# Patient Record
Sex: Female | Born: 1950 | Race: White | Hispanic: No | Smoking: Never smoker
Health system: Southern US, Community
[De-identification: ages and names within clinical notes are randomized; demographics above are authoritative.]

## PROBLEM LIST (undated history)

## (undated) DIAGNOSIS — K219 Gastro-esophageal reflux disease without esophagitis: Secondary | ICD-10-CM

## (undated) DIAGNOSIS — R011 Cardiac murmur, unspecified: Secondary | ICD-10-CM

## (undated) DIAGNOSIS — Z860100 Personal history of colon polyps, unspecified: Secondary | ICD-10-CM

## (undated) DIAGNOSIS — Z8041 Family history of malignant neoplasm of ovary: Secondary | ICD-10-CM

## (undated) DIAGNOSIS — G4730 Sleep apnea, unspecified: Secondary | ICD-10-CM

## (undated) DIAGNOSIS — T8859XA Other complications of anesthesia, initial encounter: Secondary | ICD-10-CM

## (undated) DIAGNOSIS — G2581 Restless legs syndrome: Secondary | ICD-10-CM

## (undated) DIAGNOSIS — I10 Essential (primary) hypertension: Secondary | ICD-10-CM

## (undated) DIAGNOSIS — K589 Irritable bowel syndrome without diarrhea: Secondary | ICD-10-CM

## (undated) DIAGNOSIS — K5792 Diverticulitis of intestine, part unspecified, without perforation or abscess without bleeding: Secondary | ICD-10-CM

## (undated) DIAGNOSIS — E785 Hyperlipidemia, unspecified: Secondary | ICD-10-CM

## (undated) DIAGNOSIS — Z803 Family history of malignant neoplasm of breast: Secondary | ICD-10-CM

## (undated) DIAGNOSIS — Z8719 Personal history of other diseases of the digestive system: Secondary | ICD-10-CM

## (undated) DIAGNOSIS — K559 Vascular disorder of intestine, unspecified: Secondary | ICD-10-CM

## (undated) DIAGNOSIS — D649 Anemia, unspecified: Secondary | ICD-10-CM

## (undated) DIAGNOSIS — C801 Malignant (primary) neoplasm, unspecified: Secondary | ICD-10-CM

## (undated) DIAGNOSIS — F419 Anxiety disorder, unspecified: Secondary | ICD-10-CM

## (undated) DIAGNOSIS — J329 Chronic sinusitis, unspecified: Secondary | ICD-10-CM

## (undated) DIAGNOSIS — T4145XA Adverse effect of unspecified anesthetic, initial encounter: Secondary | ICD-10-CM

## (undated) DIAGNOSIS — M797 Fibromyalgia: Secondary | ICD-10-CM

## (undated) DIAGNOSIS — Z8042 Family history of malignant neoplasm of prostate: Secondary | ICD-10-CM

## (undated) DIAGNOSIS — R7303 Prediabetes: Secondary | ICD-10-CM

## (undated) DIAGNOSIS — F32A Depression, unspecified: Secondary | ICD-10-CM

## (undated) HISTORY — DX: Family history of malignant neoplasm of ovary: Z80.41

## (undated) HISTORY — DX: Essential (primary) hypertension: I10

## (undated) HISTORY — DX: Personal history of colonic polyps: Z86.010

## (undated) HISTORY — DX: Family history of malignant neoplasm of breast: Z80.3

## (undated) HISTORY — DX: Sleep apnea, unspecified: G47.30

## (undated) HISTORY — DX: Personal history of colon polyps, unspecified: Z86.0100

## (undated) HISTORY — DX: Hyperlipidemia, unspecified: E78.5

## (undated) HISTORY — PX: TONSILLECTOMY: SHX5217

## (undated) HISTORY — DX: Gastro-esophageal reflux disease without esophagitis: K21.9

## (undated) HISTORY — DX: Diverticulitis of intestine, part unspecified, without perforation or abscess without bleeding: K57.92

## (undated) HISTORY — PX: CHOLECYSTECTOMY: SHX55

## (undated) HISTORY — DX: Irritable bowel syndrome, unspecified: K58.9

## (undated) HISTORY — DX: Family history of malignant neoplasm of prostate: Z80.42

## (undated) HISTORY — DX: Fibromyalgia: M79.7

## (undated) HISTORY — DX: Restless legs syndrome: G25.81

## (undated) HISTORY — DX: Anxiety disorder, unspecified: F41.9

## (undated) HISTORY — DX: Vascular disorder of intestine, unspecified: K55.9

## (undated) HISTORY — DX: Chronic sinusitis, unspecified: J32.9

---
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
## 1999-03-29 HISTORY — PX: TOTAL ABDOMINAL HYSTERECTOMY: SHX209

## 1999-08-11 ENCOUNTER — Encounter (INDEPENDENT_AMBULATORY_CARE_PROVIDER_SITE_OTHER): Payer: Self-pay

## 1999-08-11 ENCOUNTER — Other Ambulatory Visit: Admission: RE | Admit: 1999-08-11 | Discharge: 1999-08-11 | Payer: Self-pay | Admitting: *Deleted

## 1999-09-07 ENCOUNTER — Encounter (INDEPENDENT_AMBULATORY_CARE_PROVIDER_SITE_OTHER): Payer: Self-pay | Admitting: Specialist

## 1999-09-07 ENCOUNTER — Inpatient Hospital Stay (HOSPITAL_COMMUNITY): Admission: RE | Admit: 1999-09-07 | Discharge: 1999-09-10 | Payer: Self-pay | Admitting: *Deleted

## 2000-10-05 ENCOUNTER — Other Ambulatory Visit: Admission: RE | Admit: 2000-10-05 | Discharge: 2000-10-05 | Payer: Self-pay | Admitting: *Deleted

## 2000-10-05 ENCOUNTER — Encounter (INDEPENDENT_AMBULATORY_CARE_PROVIDER_SITE_OTHER): Payer: Self-pay | Admitting: *Deleted

## 2001-09-07 ENCOUNTER — Encounter: Payer: Self-pay | Admitting: Gastroenterology

## 2001-09-07 ENCOUNTER — Ambulatory Visit (HOSPITAL_COMMUNITY): Admission: RE | Admit: 2001-09-07 | Discharge: 2001-09-07 | Payer: Self-pay | Admitting: Gastroenterology

## 2001-10-08 ENCOUNTER — Other Ambulatory Visit: Admission: RE | Admit: 2001-10-08 | Discharge: 2001-10-08 | Payer: Self-pay | Admitting: *Deleted

## 2002-03-28 HISTORY — PX: CHOLECYSTECTOMY: SHX55

## 2002-10-17 ENCOUNTER — Other Ambulatory Visit: Admission: RE | Admit: 2002-10-17 | Discharge: 2002-10-17 | Payer: Self-pay | Admitting: *Deleted

## 2002-10-21 ENCOUNTER — Encounter (INDEPENDENT_AMBULATORY_CARE_PROVIDER_SITE_OTHER): Payer: Self-pay | Admitting: *Deleted

## 2002-10-21 ENCOUNTER — Ambulatory Visit (HOSPITAL_COMMUNITY): Admission: RE | Admit: 2002-10-21 | Discharge: 2002-10-21 | Payer: Self-pay | Admitting: Gastroenterology

## 2002-11-15 ENCOUNTER — Encounter: Admission: RE | Admit: 2002-11-15 | Discharge: 2002-11-15 | Payer: Self-pay | Admitting: Gastroenterology

## 2002-11-15 ENCOUNTER — Encounter: Payer: Self-pay | Admitting: Gastroenterology

## 2003-01-03 ENCOUNTER — Encounter: Payer: Self-pay | Admitting: Surgery

## 2003-01-09 ENCOUNTER — Observation Stay (HOSPITAL_COMMUNITY): Admission: RE | Admit: 2003-01-09 | Discharge: 2003-01-10 | Payer: Self-pay | Admitting: Surgery

## 2003-01-09 ENCOUNTER — Encounter (INDEPENDENT_AMBULATORY_CARE_PROVIDER_SITE_OTHER): Payer: Self-pay | Admitting: Specialist

## 2004-08-04 ENCOUNTER — Encounter: Admission: RE | Admit: 2004-08-04 | Discharge: 2004-08-04 | Payer: Self-pay | Admitting: Gastroenterology

## 2004-08-09 ENCOUNTER — Encounter: Admission: RE | Admit: 2004-08-09 | Discharge: 2004-08-09 | Payer: Self-pay | Admitting: Gastroenterology

## 2008-12-11 ENCOUNTER — Encounter: Admission: RE | Admit: 2008-12-11 | Discharge: 2008-12-11 | Payer: Self-pay | Admitting: Gastroenterology

## 2009-03-28 HISTORY — PX: NISSEN FUNDOPLICATION: SHX2091

## 2009-05-15 ENCOUNTER — Ambulatory Visit (HOSPITAL_COMMUNITY): Admission: RE | Admit: 2009-05-15 | Discharge: 2009-05-15 | Payer: Self-pay | Admitting: Surgery

## 2009-05-19 ENCOUNTER — Ambulatory Visit (HOSPITAL_COMMUNITY): Admission: RE | Admit: 2009-05-19 | Discharge: 2009-05-19 | Payer: Self-pay | Admitting: Gastroenterology

## 2009-06-18 ENCOUNTER — Encounter: Admission: RE | Admit: 2009-06-18 | Discharge: 2009-06-18 | Payer: Self-pay | Admitting: Cardiovascular Disease

## 2009-06-24 ENCOUNTER — Ambulatory Visit (HOSPITAL_COMMUNITY): Admission: RE | Admit: 2009-06-24 | Discharge: 2009-06-24 | Payer: Self-pay | Admitting: Cardiovascular Disease

## 2009-07-06 ENCOUNTER — Ambulatory Visit (HOSPITAL_COMMUNITY): Admission: RE | Admit: 2009-07-06 | Discharge: 2009-07-06 | Payer: Self-pay | Admitting: Gastroenterology

## 2009-09-11 ENCOUNTER — Ambulatory Visit (HOSPITAL_COMMUNITY): Admission: RE | Admit: 2009-09-11 | Discharge: 2009-09-14 | Payer: Self-pay | Admitting: Surgery

## 2009-09-11 HISTORY — PX: PARAESOPHAGEAL HERNIA REPAIR: SHX2161

## 2010-02-01 ENCOUNTER — Ambulatory Visit (HOSPITAL_COMMUNITY): Admission: RE | Admit: 2010-02-01 | Discharge: 2010-02-01 | Payer: Self-pay | Admitting: Surgery

## 2010-04-18 ENCOUNTER — Encounter: Payer: Self-pay | Admitting: Gastroenterology

## 2010-04-18 ENCOUNTER — Encounter: Payer: Self-pay | Admitting: Cardiology

## 2010-06-14 LAB — CBC
MCHC: 34.5 g/dL (ref 30.0–36.0)
RDW: 12.7 % (ref 11.5–15.5)

## 2010-06-14 LAB — BASIC METABOLIC PANEL
CO2: 27 mEq/L (ref 19–32)
Calcium: 9.2 mg/dL (ref 8.4–10.5)
Chloride: 106 mEq/L (ref 96–112)
GFR calc non Af Amer: >60 mL/min (ref >60)
Glucose, Bld: 88 mg/dL (ref 70–99)
Potassium: 4.3 mEq/L (ref 3.5–5.1)

## 2010-06-14 LAB — SURGICAL PCR SCREEN
MRSA, PCR: NEGATIVE
Staphylococcus aureus: POSITIVE — AB

## 2010-08-13 NOTE — Op Note (Signed)
NAME:  Doris Reyes, Doris Reyes                        ACCOUNT NO.:  [id number]   MEDICAL RECORD NO.:  [id number]                   PATIENT TYPE:  AMB   LOCATION:  DAY                                  FACILITY:  [facility]   PHYSICIAN:  Abigail Miyamoto, M.D.              DATE OF BIRTH:  03-04-1951   DATE OF PROCEDURE:  01/09/2003  DATE OF DISCHARGE:                                 OPERATIVE REPORT   PREOPERATIVE DIAGNOSIS:  Biliary dyskinesia.   POSTOPERATIVE DIAGNOSIS:  Biliary dyskinesia.   PROCEDURE:  Laparoscopic cholecystectomy.   SURGEON:  Abigail Miyamoto, M.D.   ASSISTANT:  Sheppard Plumber. Earlene Plater, M.D.   ANESTHESIA:  General endotracheal anesthesia.   ESTIMATED BLOOD LOSS:  Minimal.   FINDINGS:  The patient was found to have a chronically scarred gallbladder.  The cystic duct was too small and had multiple valves so a cholangiogram  could not be performed.   DESCRIPTION OF PROCEDURE:  The patient was brought to the operating room,  identified as Doris Reyes. She was placed supine on the operating room  table and general anesthesia was induced. Her abdomen was then prepped and  draped in the usual sterile fashion. Using a #15 blade, a small transverse  incision was made below the umbilicus, the incision was carried down to the  fascia which was then opened with the scalpel. A hemostat was then used to  pass through the peritoneal cavity. Next, a 0 Vicryl pursestring suture was  placed around the fascial opening. The Hasson port was placed through the  opening and insufflation of the abdomen was begun. A 12 mm port was then  placed in the patient's epigastrium and two 5 mm ports were placed in the  patient's right flank under direct vision. The gallbladder was then grasped  and retracted above the liver bed.  Dissection was then carried out at the  base of the gallbladder. The patient had multiple adhesions to the  gallbladder which were taken down bluntly. The cystic duct and  cystic artery  were then easily identified. The artery was actually clipped twice  proximally, one distally and transected with the scissors. The cystic duct  was then clipped once distally and opened with the laparoscopic scissors. An  angiocatheter was then inserted into the right upper quadrant under direct  vision. The cholangiocatheter was passed through this and multiple attempts  were made to pass it into the opening of the cystic duct. The cystic duct  was quite small and had multiple valves and the catheter could not be  successfully passed into the duct. Therefore a decision was made to forego  cholangiogram. At this point, the cystic duct was clipped four times  proximally and transected with the scissors. The gallbladder was then slowly  dissected free from the liver bed with the electrocautery. Once the  gallbladder was free from the liver bed, the liver bed was examined and  hemostasis was achieved with the cautery. The gallbladder was then removed  through the incision at the umbilicus. The 0 Vicryl at the umbilicus was  tied in place closing the fascial defect. The abdomen was again examined and  hemostasis felt to be achieved. All ports were then removed under direct  vision and the abdomen was deflated. All incisions were anesthetized with  0.25% Marcaine and closed with 4-0 Monocryl subcuticular sutures. Steri-  Strips,  gauze and tape were then applied. The patient tolerated the procedure well.  All sponge, needle and instrument counts were correct at the end of the  procedure. The patient was then extubated in the operating room and taken in  stable condition to the recovery room.                                               Abigail Miyamoto, M.D.    DB/MEDQ  D:  01/09/2003  T:  01/09/2003  Job:  [id number]   cc:   Anselmo Rod, M.D.  [address]  [address]  [place]  [place] [address]  Fax: [phone]

## 2010-08-13 NOTE — Op Note (Signed)
   NAME:  Doris Reyes, Doris Reyes                        ACCOUNT NO.:  [id number]   MEDICAL RECORD NO.:  [id number]                   PATIENT TYPE:  AMB   LOCATION:  ENDO                                 FACILITY:  [facility]   PHYSICIAN:  Anselmo Rod, M.D.               DATE OF BIRTH:  12-04-1950   DATE OF PROCEDURE:  10/21/2002  DATE OF DISCHARGE:                                 OPERATIVE REPORT   PROCEDURE PERFORMED:  Colonoscopy with snare polypectomy x1.   ENDOSCOPIST:  Anselmo Rod, M.D.   INSTRUMENT USED:  Olympus video colonoscope.   INDICATION FOR PROCEDURE:  A 60 year old white female with a family history  of colon cancer in her mother, undergoing a screening colonoscopy.  Rule out  colonic polyps, masses, etc.   PREPROCEDURE PREPARATION:  Informed consent was procured from the patient.  The patient was fasted for eight hours prior to the procedure and prepped  with a bottle of magnesium citrate and a gallon of GoLYTELY the night prior  to the procedure.   PREPROCEDURE PHYSICAL:  VITAL SIGNS:  The patient had stable vital signs.  NECK:  Supple.  CHEST:  Clear to auscultation.  S1, S2 regular.  ABDOMEN:  Soft with normal bowel sounds.   DESCRIPTION OF PROCEDURE:  The patient was placed in the left lateral  decubitus position and sedated with an additional 10 mg of Demerol and 1 mg  of Versed for the colonoscopy.  She received Demerol and Versed for the EGD  as well.  Once the patient was adequately sedate and maintained on low-flow  oxygen and continuous cardiac monitoring, the Olympus video colonoscope was  advanced from the rectum to the cecum without difficulty.  The appendiceal  orifice and the ileocecal valve were clearly visualized and photographed.  The terminal ileum appeared normal and healthy.  A small polyp was snared  from 15 cm.  The rest of the mucosa appeared healthy.   IMPRESSION:  Normal colonoscopy up to the terminal ileum except for a small  polyp  snared at 15 cm.    RECOMMENDATIONS:  1. Await pathology results.  2. Avoid all nonsteroidals, including aspirin, for the next two weeks.  3. Outpatient follow-up in the next two weeks for further recommendations.                                               Anselmo Rod, M.D.    JNM/MEDQ  D:  10/22/2002  T:  10/23/2002  Job:  [id number]   cc:   Melida Quitter, M.D.  [address]  [place]  [place] [address]  Fax: [phone]

## 2010-08-13 NOTE — Discharge Summary (Signed)
[facility]  Patient:    Doris Reyes, Doris Reyes                     MRN: [id number] Adm. Date:  [id number] Disc. Date: [id number] Attending:  Marin Comment CC:         Arvella Merles, M.D.                           Discharge Summary  ADMISSION DIAGNOSES: 1. Complex left adnexal mass. 2. Dysfunctional uterine bleeding.  DISCHARGE DIAGNOSES: 1. Endometriotic cyst of the left ovary. 2. Leiomyomata. 3. Benign right ovarian cyst. 4. Unremarkable cervix.  For details of the patients history and physical, please see the note dictated on and dated September 07, 1999.  HISTORY OF PRESENT ILLNESS:  The patient is 60 years old.  She had an episode of dysfunctional uterine bleeding over the last year which has been associated with perimenopausal symptoms.  She has fibroids in her uterus.  She was noted to have an enlargement in her left adnexa and sonogram showed a complex mass thought to be due to a dermoid.  After counseling, she was brought to the operating room for excision of this left adnexal mass with plans for ovarian cancer staging if this were found on frozen section.  In addition, a hysterectomy was to performed because of her dysfunctional uterine bleeding and myomas.  HOSPITAL COURSE:  The patient was taken to the operating room on the day of admission and under general anesthesia, total abdominal hysterectomy and bilateral salpingo-oophorectomy was performed.  The patients left ovary was 6 cm in size and was consistent with an endometrioma.  On the evening of surgery, the patient was alert and conversant.  Her pain was moderately well controlled with PCA and she was used to use her inspirometer without difficulty.  She was hypotensive and her atenolol was held.  On the morning of postoperative day #1, she had some mild nausea, but had not vomited.  She was started on clear liquids which she tolerated well. Hemoglobin was stable at 10.9.  On  that day, she was given a discharge instruction sheet.  When seen later in the evening, she had vomited twice that morning.  She had had no flatus.  We discontinued her Foley catheter, got her out of bed to move around and continued her PCA.  On the morning of postoperative day #2, she had flatus.  She had had an episode of significant upper abdominal pain and her Prevacid was restarted.  She was also given her prescription for dicyclomine which she receives for irritable bowel syndrome. On that day, she was able to advance her diet to a regular diet and was begun on pain medication.  The plans were to discharge her that evening, however she began to have more abdominal pain and was concerned about being home with this as a symptom.  For this reason, she stayed overnight.  She was seen on the morning of postoperative day #3, still tolerating a regular diet.  She had now had a bowel movement and was tolerating Tylox for pain medication and was able to be discharged.  PLANS:  The patient will come to the office on Monday to have her staples removed.  MEDICATIONS ON DISCHARGE: 1. Tylox one p.o. q.4h., #30. 2. Darvocet, #30, one p.o. q.4h. 3. Peri-Colace, #10, one p.o. q.d. while on Tylox. 4. Vivelle patch 0.05 mg to change twice  weekly. DD:  09/10/99 TD:  09/14/99 Job: [id number]

## 2010-08-13 NOTE — Op Note (Signed)
   NAME:  Doris Reyes, Doris Reyes                        ACCOUNT NO.:  [id number]   MEDICAL RECORD NO.:  [id number]                   PATIENT TYPE:  AMB   LOCATION:  ENDO                                 FACILITY:  [facility]   PHYSICIAN:  Anselmo Rod, M.D.               DATE OF BIRTH:  [birth year]   DATE OF PROCEDURE:  10/21/2002  DATE OF DISCHARGE:                                 OPERATIVE REPORT   PROCEDURE PERFORMED:  Esophagogastroduodenoscopy.   ENDOSCOPIST:  Anselmo Rod, M.D.   INSTRUMENT USED:  Olympus video colonoscope.   INDICATION FOR PROCEDURE:  A 59 year old female undergoing an upper  endoscopy for epigastric pain.  Rule out peptic ulcer disease, esophagitis,  gastritis, etc.   PREPROCEDURE PREPARATION:  Informed consent was procured from the patient.  The patient fasted for eight hours prior to the procedure.   PREPROCEDURE PHYSICAL:  VITAL SIGNS:  The patient had stable vital signs.  NECK:  Supple.  CHEST:  Clear to auscultation.  S1, S2 regular.  ABDOMEN:  Soft with normal bowel sounds.   DESCRIPTION OF PROCEDURE:  The patient was placed in the left lateral  decubitus position and sedated with 50 mg of Demerol and 5 mg of Versed  intravenously.  Once the patient was adequately sedate and maintained on low-  flow oxygen and continuous cardiac monitoring, the Olympus video  panendoscope was advanced through the mouthpiece, over the tongue, into the  esophagus under direct vision.  The entire esophagus appeared normal with no  evidence of ring, stricture, masses, esophagitis, or Barrett's mucosa.  The  scope was then advanced in the stomach.  A small hiatal hernia was seen on  high retroflexion.  Except for mild antral gastritis, no other abnormalities  were noted.  The duodenal bulb and the proximal small bowel appeared normal  as well.   IMPRESSION:  1. Mild antral gastritis.  2.     Small hiatal hernia.  3. Otherwise normal EGD.   RECOMMENDATIONS:  Proceed  with a colonoscopy at this time.                                               Anselmo Rod, M.D.    JNM/MEDQ  D:  10/22/2002  T:  10/23/2002  Job:  [id number]   cc:   Melida Quitter, M.D.  [address]  [place]  [place] [address]  Fax: [phone]

## 2010-08-13 NOTE — Op Note (Signed)
[facility]  Patient:    Doris Reyes, EBARB                     MRN: [id number] Adm. Date:  [id number] Attending:  Marin Comment CC:         Arvella Merles, M.D.             Pershing Cox, M.D.             Sheronette A. Cherly Hensen, M.D.                           Operative Report  PREOPERATIVE DIAGNOSES:  Complex left adnexal mass and dysfunctional uterine bleeding.  POSTOPERATIVE DIAGNOSES:  Bilateral endometriomas, dysfunctional bleeding.  PROCEDURE:  Exploratory laparotomy, total abdominal hysterectomy, bilateral salpingo-oophorectomy.  ANESTHESIA:  General endotracheal.  SURGEON:  Pershing Cox, M.D.  ASSISTANT SURGEON:  Sheronette A. Cherly Hensen, M.D.  INDICATION FOR PROCEDURE:  The patient is 60 years old.  She has recently had some problems with dysfunctional uterine bleeding and her physician ordered a pelvic ultrasound.  At that time, she was found to have a uterus which was enlarged in size, with a 4.8-cm endometrial stripe and a left adnexal mass. An MRI followed this diagnostic study, suggesting a cystic teratoma.  She was sent to me for evaluation.  Following her evaluation, she was counseled regarding surgery.  I felt that a diagnostic laparoscopy with oophorectomy was an inappropriate approach to this large mass and she was scheduled for hysterectomy.  Because the patient is in the perimenopausal years and she has had persistent dysfunctional bleeding, she elected to proceed with hysterectomy, even if a benign mass was found.  FINDINGS:  There was no ascites.  There were no peritoneal implants.  The ovary was approximately 10 cm in size and was adherent to the epiploica of the rectosigmoid as well as to the posterior surface of the broad ligament.  In removing the ovary from the posterior surface of the broad ligament, chocolate fluid was evacuated from the ovary.  The right ovary contained a 3-cm cyst, which also  released chocolate fluid on manipulation.  The uterus itself was approximately 10 cm in size.  There were no palpable myomas.  Frozen section diagnosis returned as endometriomas for both the right and left ovaries. There was adhesive disease of the rectosigmoid to the posterior cul-de-sac but other than this, no other adhesive disease was noted.  The appendix was totally normal in appearance.  DESCRIPTION OF PROCEDURE:  The patient was brought to the operating room with an IV in place.  She had received a gram of Ancef in the holding area.  Supine on the OR table, general endotracheal anesthesia was administered without difficulty.  She was then placed into a frog-leg position and the anterior abdominal wall, perineum and vagina were prepped with a solution of Hibiclens. Foley catheter was sterilely inserted into the bladder.  Patient was draped for a midline incision.  A subumbilical incision approximately 6 cm in length was made through the skin, carried down through the subcutaneous tissues until the fascia was encountered.  The fascia was opened, exposing the underlying rectus muscles, which were separated in the midline, after dividing the prepectoral fat.  The peritoneum was tented and entered atraumatically.  Both anterior abdominal wall leaves were lifted with retractors and 500 cc of warm saline were instilled and collected for peritoneal washings.  Warm laps were  then used to pack the bowel into the upper abdomen.  Peritoneal incision was extended the length of the incision.  Long Kelly clamps were placed along the adnexal structures.  Both round ligaments were suture-ligated and held after cautery, transected the ligaments.  The peritoneum was incised lateral to the ovarian vessels.  The vessels were reflected medially so that the ureters could be visualized.  A clear space beneath the ovarian vessels was developed and the vessels were clamped, cut, suture- and  free-tie-ligated.  Posterior peritoneum was then incised beneath the ovary and fallopian tube.  On the left, the dissection required lysis of multiple adhesions.  There were lysed without rupturing the ovary; however, as the ovary was lifted off of the posterior peritoneum, thick, dark crankcase fluid was extruded.   The uteroovarian ligament was clamped; the ovary was removed and sent for frozen section.  The peritoneal cavity was then copiously irrigated to remove the spilled contents. The ovary on the right side was also skeletonized prior to bringing it up onto the surface of the uterus.  Peritoneum along the lower uterine segment was incised; this was a difficult dissection.  There was no clear plane initially beneath the space to separate the bladder from the lower uterine segment.  We were able to clear enough space so that the uterine arteries could be clamped. The arteries were skeletonized, then doubly clamped and suture-ligated.  The bladder was then carefully taken down from the middle of the vagina so that the straight clamps applied along the cervix to separate it from the cardinal ligament produced no injury to the bladder.  These sutures were Heaney stitched.  Uterosacral ligaments were developed as individual pedicles and held for later in the case.  Entered the vagina on the patients right.  With the bladder carefully visualized, the cervix was excised from the upper vagina.  The vagina was wide and was held with clamps.  Angled sutures were placed, both on the right and left, and then later these sutures were used to whip the anterior and posterior cuff.  The vagina was brought together side-to-side using three figure-of-eight sutures.  We then irrigated the peritoneal cavity.  There were multiple small bleeders which were controlled with cautery, or when appropriate, a 3-0 Vicryl stitch.  It should be noted that the previously held uterosacral ligaments were tied to the  vaginal angles to offer additional support to the vagina.  Redundant rectosigmoid was layered  into the cul-de-sac to help prevent the potential enterocele formation.  A Moshowitz was not performed.  The IP ligaments were inspected and there was no evidence of bleeding.  Small bowel was then layered carefully into the peritoneal cavity and the omentum was drawn down over the top.  The fascia was closed with running double-stranded Prolene, starting from the bottom and the top, meeting in the middle.  The stitch was buried beneath the fascia. Subcutaneous tissues were irrigated and made hemostatic.  Marcaine 0.25% was instilled, both into the fascia and into the subcutaneous tissue of the incision, and skin staples were applied.  Estimated blood loss -- 2500 cc.  Urine output -- 275 cc.  Fluids -- 2500 cc. D:  09/07/99 TD:  09/09/99 Job: [id number]

## 2010-10-05 ENCOUNTER — Encounter (INDEPENDENT_AMBULATORY_CARE_PROVIDER_SITE_OTHER): Payer: Self-pay | Admitting: Surgery

## 2010-10-06 ENCOUNTER — Ambulatory Visit (INDEPENDENT_AMBULATORY_CARE_PROVIDER_SITE_OTHER): Payer: Medicare Other | Admitting: Surgery

## 2010-10-06 ENCOUNTER — Other Ambulatory Visit (INDEPENDENT_AMBULATORY_CARE_PROVIDER_SITE_OTHER): Payer: Self-pay | Admitting: Surgery

## 2010-10-06 ENCOUNTER — Encounter (INDEPENDENT_AMBULATORY_CARE_PROVIDER_SITE_OTHER): Payer: Self-pay | Admitting: Surgery

## 2010-10-06 VITALS — BP 126/90 | HR 72 | Temp 96.8°F | Ht 64.0 in | Wt 205.4 lb

## 2010-10-06 DIAGNOSIS — R1110 Vomiting, unspecified: Secondary | ICD-10-CM | POA: Insufficient documentation

## 2010-10-06 DIAGNOSIS — K219 Gastro-esophageal reflux disease without esophagitis: Secondary | ICD-10-CM

## 2010-10-06 DIAGNOSIS — K910 Vomiting following gastrointestinal surgery: Secondary | ICD-10-CM

## 2010-10-06 DIAGNOSIS — K3189 Other diseases of stomach and duodenum: Secondary | ICD-10-CM

## 2010-10-06 DIAGNOSIS — K30 Functional dyspepsia: Secondary | ICD-10-CM

## 2010-10-06 NOTE — Patient Instructions (Signed)
Increase omeprazole to 2x a day for at least 3 weeks.  Get Xray studies (UGI & gastric emptying study).  Call us after studies done  Switch to smaller meals 5-6x/day  Continue to work to lose weight.  Call if worse

## 2010-10-06 NOTE — Progress Notes (Signed)
Addended by: Karie Soda C on: 10/06/2010 11:45 AM   Modules accepted: Orders, SmartSet

## 2010-10-06 NOTE — Progress Notes (Addendum)
Subjective:     Patient ID: Doris Reyes, female   DOB: Oct 05, 1950, 60 y.o.   MRN: [id number]    BP 126/90  Pulse 72  Temp(Src) 96.8 F (36 C) (Temporal)  Ht 5\' 4"  (1.626 m)  Wt 205 lb 6.4 oz (93.169 kg)  BMI 35.26 kg/m2    HPI  Diagnosis: Paraesophageal hiatal hernia and reflux  Procedure: Laparoscopic PE hiatal hernia repair/Nissen fundoplication June 2011  Reason for visit:  Nausea, vomiting symptoms.  The patient notes in April 2012 she began to have an episode of "flu". She vomited a few times. She took nausea medicines and rode it out. She now notes that about every 2 weeks she gets an episode of some nausea and retching. She will get diarrhea associated with this. She's had a couple episodes where she will feel  "foul-smelling" taste in the back of her throat. It is not consistent with reflux she said the past.  No dysphagia to solids nor liquids. She denies any heartburn / reflux. She has been on omeprazole daily for the past 3 months. She did have a workup for him nasal drip by Dr. Jearld Fenton with ENT back in December 2011. He switched her allergy medications (to Allegra) and that is much better.  She denies any sore throat. She denies any sinusitis. She does notes no reflux with bending nor lying flat. Her energy levels otherwise has been okay. She has not lost weight. She again struggles with the intermittent bouts of diarrhea. She seen Dr. Loreta Ave with GI who is giving her medications for this Ernestina Patches).  Review of Systems  Constitutional: Negative for fever, chills, diaphoresis, appetite change and fatigue.  HENT: Negative for nosebleeds, sore throat, mouth sores, neck pain and neck stiffness.   Eyes: Negative for photophobia, discharge and visual disturbance.  Respiratory: Negative for cough, choking, chest tightness and shortness of breath.   Cardiovascular: Negative for chest pain and palpitations.  Gastrointestinal: Positive for nausea and vomiting. Negative for  abdominal pain, diarrhea, constipation, blood in stool, abdominal distention, anal bleeding and rectal pain.  Genitourinary: Negative for dysuria, frequency, flank pain, vaginal bleeding, vaginal discharge and difficulty urinating.  Musculoskeletal: Negative for back pain, arthralgias and gait problem.  Skin: Negative for color change, pallor and rash.  Neurological: Negative for dizziness, speech difficulty, weakness and numbness.  Hematological: Negative for adenopathy. Does not bruise/bleed easily.  Psychiatric/Behavioral: Negative for confusion and agitation. The patient is not nervous/anxious.        Objective:   Physical Exam  Constitutional: She is oriented to person, place, and time. She appears well-developed and well-nourished. No distress.  HENT:  Head: Normocephalic.  Nose: Nose normal. Right sinus exhibits no maxillary sinus tenderness and no frontal sinus tenderness. Left sinus exhibits no maxillary sinus tenderness and no frontal sinus tenderness.  Mouth/Throat: Oropharynx is clear and moist and mucous membranes are normal. No oral lesions. Normal dentition. No dental abscesses, uvula swelling or dental caries. No oropharyngeal exudate, posterior oropharyngeal edema, posterior oropharyngeal erythema or tonsillar abscesses.  Eyes: Conjunctivae and EOM are normal. Pupils are equal, round, and reactive to light. No scleral icterus.  Neck: Normal range of motion. Neck supple. No tracheal deviation present.  Cardiovascular: Normal rate, regular rhythm and intact distal pulses.   Pulmonary/Chest: Effort normal and breath sounds normal. No respiratory distress. She exhibits no tenderness.  Abdominal: Soft. She exhibits no distension and no mass. There is no tenderness. There is no guarding. Hernia confirmed negative in the right  inguinal area and confirmed negative in the left inguinal area.       Incisions healed.  No hernia  Genitourinary: No vaginal discharge found.    Musculoskeletal: Normal range of motion. She exhibits no tenderness.  Lymphadenopathy:    She has no cervical adenopathy.       Right: No inguinal adenopathy present.       Left: No inguinal adenopathy present.  Neurological: She is alert and oriented to person, place, and time. No cranial nerve deficit. She exhibits normal muscle tone. Coordination normal.  Skin: Skin is warm and dry. No rash noted. She is not diaphoretic. No erythema.  Psychiatric: She has a normal mood and affect. Her behavior is normal. Judgment and thought content normal.       Assessment:     Foul taste in the back of throat ? Possible reflux.  Intermittent vomiting ?gastroenteritis, delayed gastric emptying, irritable bowel.    Plan:     Increase omeprazole to twice a day.  Switch to smaller, more frequent meals.  Esophagram to evaluate wrap, r/o slipped wrap, recurrent HH, r/o Zenker's.  Gastric emptying study. She had some moderately delayed gastric emptying preop but it does not make sense why it suddenly this got worse. See if she has any persistent delay in and  would benefit from further treatment.  Nissen usually helps correct gastroparesis  Call after her studies are done.

## 2010-10-12 ENCOUNTER — Ambulatory Visit (HOSPITAL_COMMUNITY)
Admission: RE | Admit: 2010-10-12 | Discharge: 2010-10-12 | Disposition: A | Discharge status: Home / Self Care | Payer: Medicare Other | Source: Ambulatory Visit | Attending: Surgery | Admitting: Surgery

## 2010-10-12 DIAGNOSIS — Z9889 Other specified postprocedural states: Secondary | ICD-10-CM | POA: Insufficient documentation

## 2010-10-12 DIAGNOSIS — R112 Nausea with vomiting, unspecified: Secondary | ICD-10-CM | POA: Insufficient documentation

## 2010-10-12 DIAGNOSIS — Z9089 Acquired absence of other organs: Secondary | ICD-10-CM | POA: Insufficient documentation

## 2010-10-12 DIAGNOSIS — K219 Gastro-esophageal reflux disease without esophagitis: Secondary | ICD-10-CM

## 2010-10-12 DIAGNOSIS — K30 Functional dyspepsia: Secondary | ICD-10-CM

## 2010-10-12 DIAGNOSIS — K910 Vomiting following gastrointestinal surgery: Secondary | ICD-10-CM

## 2010-10-22 ENCOUNTER — Other Ambulatory Visit (HOSPITAL_COMMUNITY): Payer: Self-pay

## 2010-11-05 ENCOUNTER — Encounter (HOSPITAL_COMMUNITY)
Admission: RE | Admit: 2010-11-05 | Discharge: 2010-11-05 | Disposition: A | Discharge status: Home / Self Care | Payer: Medicare Other | Source: Ambulatory Visit | Attending: Surgery | Admitting: Surgery

## 2010-11-05 DIAGNOSIS — K910 Vomiting following gastrointestinal surgery: Secondary | ICD-10-CM

## 2010-11-05 DIAGNOSIS — K30 Functional dyspepsia: Secondary | ICD-10-CM

## 2010-11-05 DIAGNOSIS — R112 Nausea with vomiting, unspecified: Secondary | ICD-10-CM | POA: Insufficient documentation

## 2010-11-05 MED ORDER — TECHNETIUM TC 99M SULFUR COLLOID
2.0000 | Freq: Once | INTRAVENOUS | Status: AC | PRN
  Administered 2010-11-05: 2 via INTRAVENOUS

## 2010-11-09 ENCOUNTER — Telehealth (INDEPENDENT_AMBULATORY_CARE_PROVIDER_SITE_OTHER): Payer: Self-pay

## 2010-11-09 NOTE — Telephone Encounter (Signed)
Called Doris Reyes to notify her of the xray results per Dr Michaell Cowing. Doris Reyes was notified that the results were good with no recurrent hiatal hernia and there was no slip of the wrap. Doris Reyes has seen Dr Loreta Ave recently and will follow up with her in the next couple of weeks./ [facility]

## 2011-07-25 DIAGNOSIS — IMO0001 Reserved for inherently not codable concepts without codable children: Secondary | ICD-10-CM | POA: Diagnosis not present

## 2011-07-25 DIAGNOSIS — J309 Allergic rhinitis, unspecified: Secondary | ICD-10-CM | POA: Diagnosis not present

## 2011-07-25 DIAGNOSIS — E785 Hyperlipidemia, unspecified: Secondary | ICD-10-CM | POA: Diagnosis not present

## 2011-07-25 DIAGNOSIS — T7840XA Allergy, unspecified, initial encounter: Secondary | ICD-10-CM | POA: Diagnosis not present

## 2011-07-25 DIAGNOSIS — K589 Irritable bowel syndrome without diarrhea: Secondary | ICD-10-CM | POA: Diagnosis not present

## 2011-07-25 DIAGNOSIS — I10 Essential (primary) hypertension: Secondary | ICD-10-CM | POA: Diagnosis not present

## 2011-07-25 DIAGNOSIS — F411 Generalized anxiety disorder: Secondary | ICD-10-CM | POA: Diagnosis not present

## 2011-08-04 DIAGNOSIS — M949 Disorder of cartilage, unspecified: Secondary | ICD-10-CM | POA: Diagnosis not present

## 2011-08-04 DIAGNOSIS — Z1231 Encounter for screening mammogram for malignant neoplasm of breast: Secondary | ICD-10-CM | POA: Diagnosis not present

## 2011-08-04 DIAGNOSIS — Z8262 Family history of osteoporosis: Secondary | ICD-10-CM | POA: Diagnosis not present

## 2011-10-21 DIAGNOSIS — H25019 Cortical age-related cataract, unspecified eye: Secondary | ICD-10-CM | POA: Diagnosis not present

## 2011-12-30 DIAGNOSIS — Z23 Encounter for immunization: Secondary | ICD-10-CM | POA: Diagnosis not present

## 2012-01-24 DIAGNOSIS — E785 Hyperlipidemia, unspecified: Secondary | ICD-10-CM | POA: Diagnosis not present

## 2012-01-24 DIAGNOSIS — IMO0001 Reserved for inherently not codable concepts without codable children: Secondary | ICD-10-CM | POA: Diagnosis not present

## 2012-01-24 DIAGNOSIS — I10 Essential (primary) hypertension: Secondary | ICD-10-CM | POA: Diagnosis not present

## 2012-01-24 DIAGNOSIS — G2589 Other specified extrapyramidal and movement disorders: Secondary | ICD-10-CM | POA: Diagnosis not present

## 2012-01-24 DIAGNOSIS — K589 Irritable bowel syndrome without diarrhea: Secondary | ICD-10-CM | POA: Diagnosis not present

## 2012-01-24 DIAGNOSIS — F411 Generalized anxiety disorder: Secondary | ICD-10-CM | POA: Diagnosis not present

## 2012-03-28 DIAGNOSIS — K559 Vascular disorder of intestine, unspecified: Secondary | ICD-10-CM

## 2012-03-28 HISTORY — DX: Vascular disorder of intestine, unspecified: K55.9

## 2012-04-19 ENCOUNTER — Other Ambulatory Visit: Payer: Self-pay | Admitting: Gastroenterology

## 2012-04-19 DIAGNOSIS — K625 Hemorrhage of anus and rectum: Secondary | ICD-10-CM | POA: Diagnosis not present

## 2012-04-20 ENCOUNTER — Ambulatory Visit (HOSPITAL_COMMUNITY)
Admission: RE | Admit: 2012-04-20 | Discharge: 2012-04-20 | Disposition: A | Discharge status: Home / Self Care | Payer: Medicare Other | Source: Ambulatory Visit | Attending: Gastroenterology | Admitting: Gastroenterology

## 2012-04-20 ENCOUNTER — Encounter (HOSPITAL_COMMUNITY)
Admission: RE | Disposition: A | Discharge status: Home / Self Care | Payer: Self-pay | Source: Ambulatory Visit | Attending: Gastroenterology

## 2012-04-20 ENCOUNTER — Encounter (HOSPITAL_COMMUNITY): Payer: Self-pay | Admitting: *Deleted

## 2012-04-20 DIAGNOSIS — K648 Other hemorrhoids: Secondary | ICD-10-CM | POA: Diagnosis not present

## 2012-04-20 DIAGNOSIS — K644 Residual hemorrhoidal skin tags: Secondary | ICD-10-CM | POA: Diagnosis not present

## 2012-04-20 DIAGNOSIS — K559 Vascular disorder of intestine, unspecified: Secondary | ICD-10-CM | POA: Insufficient documentation

## 2012-04-20 DIAGNOSIS — K625 Hemorrhage of anus and rectum: Secondary | ICD-10-CM | POA: Diagnosis not present

## 2012-04-20 DIAGNOSIS — R197 Diarrhea, unspecified: Secondary | ICD-10-CM | POA: Insufficient documentation

## 2012-04-20 DIAGNOSIS — I10 Essential (primary) hypertension: Secondary | ICD-10-CM | POA: Insufficient documentation

## 2012-04-20 DIAGNOSIS — E785 Hyperlipidemia, unspecified: Secondary | ICD-10-CM | POA: Insufficient documentation

## 2012-04-20 DIAGNOSIS — G4730 Sleep apnea, unspecified: Secondary | ICD-10-CM | POA: Insufficient documentation

## 2012-04-20 DIAGNOSIS — K219 Gastro-esophageal reflux disease without esophagitis: Secondary | ICD-10-CM | POA: Insufficient documentation

## 2012-04-20 DIAGNOSIS — K589 Irritable bowel syndrome without diarrhea: Secondary | ICD-10-CM | POA: Insufficient documentation

## 2012-04-20 DIAGNOSIS — IMO0001 Reserved for inherently not codable concepts without codable children: Secondary | ICD-10-CM | POA: Diagnosis not present

## 2012-04-20 DIAGNOSIS — M810 Age-related osteoporosis without current pathological fracture: Secondary | ICD-10-CM | POA: Diagnosis not present

## 2012-04-20 HISTORY — PX: FLEXIBLE SIGMOIDOSCOPY: SHX5431

## 2012-04-20 SURGERY — SIGMOIDOSCOPY, FLEXIBLE

## 2012-04-20 MED ORDER — SODIUM CHLORIDE 0.9 % IV SOLN: INTRAVENOUS | Status: DC

## 2012-04-20 NOTE — H&P (Signed)
   Doris Reyes HPI: This is a 62 year old female with complaints of bloody diarrhea.  She started to have the symptoms two days ago.  It was associated with generalized abdominal cramping.  She had a similar episode two years ago after she ate at a restaurant and she also had facial swelling.  At that time it was felt that she was having an allergic reaction, which she responded to treatment.  No facial swelling at this time.  The rectal examine in the office yesterday did reveal fresh blood that is not from a hemorrhoidal source.  She had a colonoscopy with Dr. Loreta Ave in 2009 with negative results.  Past Medical History  Diagnosis Date  . Fibromyalgia   . GERD (gastroesophageal reflux disease)   . Hypertension   . Hyperlipidemia   . IBS (irritable bowel syndrome)   . Osteoporosis   . Sleep apnea     Past Surgical History  Procedure Date  . Tonsillectomy 1960's  . Total abdominal hysterectomy 2001  . Cholecystectomy   . Paraesophageal hernia repair 09/11/2009    and Nissen fundoplication    Family History  Problem Relation Age of Onset  . Heart attack Father   . Colon cancer Mother     Social History:  reports that she has never smoked. She does not have any smokeless tobacco history on file. She reports that she does not drink alcohol. Her drug history not on file.  Allergies: No Known Allergies  Medications: Scheduled:   Continuous:    No results found for this or any previous visit (from the past 24 hour(s)).   No results found.  ROS:  As stated above in the HPI otherwise negative.  There were no vitals taken for this visit.    PE: Gen: NAD, Alert and Oriented HEENT:  [place]/AT, EOMI Neck: Supple, no LAD Lungs: CTA Bilaterally CV: RRR without M/G/R ABM: Soft, NTND, +BS Ext: No C/C/E  Assessment/Plan: 1) Bloody diarrhea.  Plan: 1) FFS for further evaluation.  Dynver Clemson D 04/20/2012, 10:14 AM

## 2012-04-20 NOTE — Op Note (Signed)
[facility] [address] [place], [address]   FLEXIBLE SIGMOIDOSCOPY PROCEDURE REPORT  PATIENT: Doris Reyes, Doris Reyes.  MR#: [id number] BIRTHDATE: 1950-10-24 , 61  yrs. old GENDER: Female ENDOSCOPIST: Jeani Hawking, MD REFERRED BY: PROCEDURE DATE:  04/20/2012 PROCEDURE:   Sigmoidoscopy with biopsy ASA CLASS:   Class III INDICATIONS:rectal bleeding. MEDICATIONS:  DESCRIPTION OF PROCEDURE:   After the risks benefits and alternatives of the procedure were thoroughly explained, informed consent was obtained.  revealed no abnormalities of the rectum. The EG-[id number] ([id number])  endoscope was introduced through the anus  and advanced to the descending colon , limited by No adverse events experienced.   The quality of the prep was excellent .  The instrument was then slowly withdrawn as the mucosa was fully examined.         FINDINGS: In the descending colon there was gross evidence of any ischemic colitis.  Random cold biopsies were obtained.  The mucosa was transitioning to normal in the proximal descending colon.  No other abnormalities identified.  Retroflexed views revealed internal/external hemorrhoid.    The scope was then withdrawn from the patient and the procedure terminated.  COMPLICATIONS: There were no complications.  ENDOSCOPIC IMPRESSION: 1) Descending colon ischemic colitis.Marland Kitchen  RECOMMENDATIONS: 1.  Await biopsy results 2.  Follow up with Dr.  Loreta Ave in one month.   REPEAT EXAM:   _______________________________ eSignedJeani Hawking, MD 04/20/2012 11:29 AM   CC:

## 2012-04-23 ENCOUNTER — Encounter (HOSPITAL_COMMUNITY): Payer: Self-pay | Admitting: Gastroenterology

## 2012-07-24 DIAGNOSIS — T7840XA Allergy, unspecified, initial encounter: Secondary | ICD-10-CM | POA: Diagnosis not present

## 2012-07-24 DIAGNOSIS — F411 Generalized anxiety disorder: Secondary | ICD-10-CM | POA: Diagnosis not present

## 2012-07-24 DIAGNOSIS — I10 Essential (primary) hypertension: Secondary | ICD-10-CM | POA: Diagnosis not present

## 2012-07-24 DIAGNOSIS — G2589 Other specified extrapyramidal and movement disorders: Secondary | ICD-10-CM | POA: Diagnosis not present

## 2012-07-24 DIAGNOSIS — K219 Gastro-esophageal reflux disease without esophagitis: Secondary | ICD-10-CM | POA: Diagnosis not present

## 2012-07-24 DIAGNOSIS — E785 Hyperlipidemia, unspecified: Secondary | ICD-10-CM | POA: Diagnosis not present

## 2012-07-24 DIAGNOSIS — IMO0001 Reserved for inherently not codable concepts without codable children: Secondary | ICD-10-CM | POA: Diagnosis not present

## 2012-09-13 DIAGNOSIS — Z1231 Encounter for screening mammogram for malignant neoplasm of breast: Secondary | ICD-10-CM | POA: Diagnosis not present

## 2012-09-18 DIAGNOSIS — Z1211 Encounter for screening for malignant neoplasm of colon: Secondary | ICD-10-CM | POA: Diagnosis not present

## 2012-09-18 DIAGNOSIS — K589 Irritable bowel syndrome without diarrhea: Secondary | ICD-10-CM | POA: Diagnosis not present

## 2012-09-18 DIAGNOSIS — K219 Gastro-esophageal reflux disease without esophagitis: Secondary | ICD-10-CM | POA: Diagnosis not present

## 2012-09-18 DIAGNOSIS — R1013 Epigastric pain: Secondary | ICD-10-CM | POA: Diagnosis not present

## 2012-09-21 DIAGNOSIS — IMO0002 Reserved for concepts with insufficient information to code with codable children: Secondary | ICD-10-CM | POA: Diagnosis not present

## 2012-09-21 DIAGNOSIS — T148XXA Other injury of unspecified body region, initial encounter: Secondary | ICD-10-CM | POA: Diagnosis not present

## 2012-10-23 DIAGNOSIS — H524 Presbyopia: Secondary | ICD-10-CM | POA: Diagnosis not present

## 2012-10-23 DIAGNOSIS — H2589 Other age-related cataract: Secondary | ICD-10-CM | POA: Diagnosis not present

## 2012-11-28 DIAGNOSIS — D126 Benign neoplasm of colon, unspecified: Secondary | ICD-10-CM | POA: Diagnosis not present

## 2012-11-28 DIAGNOSIS — Z86010 Personal history of colonic polyps: Secondary | ICD-10-CM | POA: Diagnosis not present

## 2012-11-28 DIAGNOSIS — Z1211 Encounter for screening for malignant neoplasm of colon: Secondary | ICD-10-CM | POA: Diagnosis not present

## 2012-11-28 DIAGNOSIS — K219 Gastro-esophageal reflux disease without esophagitis: Secondary | ICD-10-CM | POA: Diagnosis not present

## 2012-12-13 DIAGNOSIS — K219 Gastro-esophageal reflux disease without esophagitis: Secondary | ICD-10-CM | POA: Diagnosis not present

## 2013-01-03 DIAGNOSIS — Z23 Encounter for immunization: Secondary | ICD-10-CM | POA: Diagnosis not present

## 2013-04-02 DIAGNOSIS — E785 Hyperlipidemia, unspecified: Secondary | ICD-10-CM | POA: Diagnosis not present

## 2013-04-02 DIAGNOSIS — G2589 Other specified extrapyramidal and movement disorders: Secondary | ICD-10-CM | POA: Diagnosis not present

## 2013-04-02 DIAGNOSIS — T7840XA Allergy, unspecified, initial encounter: Secondary | ICD-10-CM | POA: Diagnosis not present

## 2013-04-02 DIAGNOSIS — F411 Generalized anxiety disorder: Secondary | ICD-10-CM | POA: Diagnosis not present

## 2013-04-02 DIAGNOSIS — IMO0001 Reserved for inherently not codable concepts without codable children: Secondary | ICD-10-CM | POA: Diagnosis not present

## 2013-04-02 DIAGNOSIS — K589 Irritable bowel syndrome without diarrhea: Secondary | ICD-10-CM | POA: Diagnosis not present

## 2013-04-02 DIAGNOSIS — K219 Gastro-esophageal reflux disease without esophagitis: Secondary | ICD-10-CM | POA: Diagnosis not present

## 2013-04-02 DIAGNOSIS — I10 Essential (primary) hypertension: Secondary | ICD-10-CM | POA: Diagnosis not present

## 2013-06-05 DIAGNOSIS — R21 Rash and other nonspecific skin eruption: Secondary | ICD-10-CM | POA: Diagnosis not present

## 2013-06-05 DIAGNOSIS — J310 Chronic rhinitis: Secondary | ICD-10-CM | POA: Diagnosis not present

## 2013-06-05 DIAGNOSIS — Z91013 Allergy to seafood: Secondary | ICD-10-CM | POA: Diagnosis not present

## 2013-09-04 DIAGNOSIS — J310 Chronic rhinitis: Secondary | ICD-10-CM | POA: Diagnosis not present

## 2013-09-04 DIAGNOSIS — R21 Rash and other nonspecific skin eruption: Secondary | ICD-10-CM | POA: Diagnosis not present

## 2013-09-04 DIAGNOSIS — Z91013 Allergy to seafood: Secondary | ICD-10-CM | POA: Diagnosis not present

## 2013-09-18 DIAGNOSIS — M899 Disorder of bone, unspecified: Secondary | ICD-10-CM | POA: Diagnosis not present

## 2013-09-18 DIAGNOSIS — Z8262 Family history of osteoporosis: Secondary | ICD-10-CM | POA: Diagnosis not present

## 2013-09-18 DIAGNOSIS — Z1231 Encounter for screening mammogram for malignant neoplasm of breast: Secondary | ICD-10-CM | POA: Diagnosis not present

## 2013-10-01 DIAGNOSIS — I10 Essential (primary) hypertension: Secondary | ICD-10-CM | POA: Diagnosis not present

## 2013-10-01 DIAGNOSIS — IMO0001 Reserved for inherently not codable concepts without codable children: Secondary | ICD-10-CM | POA: Diagnosis not present

## 2013-10-01 DIAGNOSIS — G2589 Other specified extrapyramidal and movement disorders: Secondary | ICD-10-CM | POA: Diagnosis not present

## 2013-10-01 DIAGNOSIS — IMO0002 Reserved for concepts with insufficient information to code with codable children: Secondary | ICD-10-CM | POA: Diagnosis not present

## 2013-10-01 DIAGNOSIS — K219 Gastro-esophageal reflux disease without esophagitis: Secondary | ICD-10-CM | POA: Diagnosis not present

## 2013-10-01 DIAGNOSIS — M1710 Unilateral primary osteoarthritis, unspecified knee: Secondary | ICD-10-CM | POA: Diagnosis not present

## 2013-10-01 DIAGNOSIS — F411 Generalized anxiety disorder: Secondary | ICD-10-CM | POA: Diagnosis not present

## 2013-10-01 DIAGNOSIS — E785 Hyperlipidemia, unspecified: Secondary | ICD-10-CM | POA: Diagnosis not present

## 2013-10-01 DIAGNOSIS — K589 Irritable bowel syndrome without diarrhea: Secondary | ICD-10-CM | POA: Diagnosis not present

## 2013-10-24 DIAGNOSIS — H2589 Other age-related cataract: Secondary | ICD-10-CM | POA: Diagnosis not present

## 2014-01-09 DIAGNOSIS — Z23 Encounter for immunization: Secondary | ICD-10-CM | POA: Diagnosis not present

## 2014-04-03 DIAGNOSIS — M797 Fibromyalgia: Secondary | ICD-10-CM | POA: Diagnosis not present

## 2014-04-03 DIAGNOSIS — M179 Osteoarthritis of knee, unspecified: Secondary | ICD-10-CM | POA: Diagnosis not present

## 2014-04-03 DIAGNOSIS — K589 Irritable bowel syndrome without diarrhea: Secondary | ICD-10-CM | POA: Diagnosis not present

## 2014-04-03 DIAGNOSIS — G259 Extrapyramidal and movement disorder, unspecified: Secondary | ICD-10-CM | POA: Diagnosis not present

## 2014-04-03 DIAGNOSIS — E780 Pure hypercholesterolemia: Secondary | ICD-10-CM | POA: Diagnosis not present

## 2014-04-03 DIAGNOSIS — K219 Gastro-esophageal reflux disease without esophagitis: Secondary | ICD-10-CM | POA: Diagnosis not present

## 2014-04-03 DIAGNOSIS — R05 Cough: Secondary | ICD-10-CM | POA: Diagnosis not present

## 2014-04-03 DIAGNOSIS — J309 Allergic rhinitis, unspecified: Secondary | ICD-10-CM | POA: Diagnosis not present

## 2014-04-03 DIAGNOSIS — F419 Anxiety disorder, unspecified: Secondary | ICD-10-CM | POA: Diagnosis not present

## 2014-04-03 DIAGNOSIS — I10 Essential (primary) hypertension: Secondary | ICD-10-CM | POA: Diagnosis not present

## 2014-08-21 ENCOUNTER — Emergency Department (HOSPITAL_COMMUNITY)
Admission: EM | Admit: 2014-08-21 | Discharge: 2014-08-21 | Discharge status: Absent without leave | Payer: No Typology Code available for payment source

## 2014-09-05 DIAGNOSIS — J310 Chronic rhinitis: Secondary | ICD-10-CM | POA: Diagnosis not present

## 2014-09-05 DIAGNOSIS — R21 Rash and other nonspecific skin eruption: Secondary | ICD-10-CM | POA: Diagnosis not present

## 2014-09-05 DIAGNOSIS — Z91013 Allergy to seafood: Secondary | ICD-10-CM | POA: Diagnosis not present

## 2014-10-06 DIAGNOSIS — J329 Chronic sinusitis, unspecified: Secondary | ICD-10-CM | POA: Diagnosis not present

## 2014-10-06 DIAGNOSIS — K219 Gastro-esophageal reflux disease without esophagitis: Secondary | ICD-10-CM | POA: Diagnosis not present

## 2014-10-06 DIAGNOSIS — I10 Essential (primary) hypertension: Secondary | ICD-10-CM | POA: Diagnosis not present

## 2014-10-06 DIAGNOSIS — F419 Anxiety disorder, unspecified: Secondary | ICD-10-CM | POA: Diagnosis not present

## 2014-10-06 DIAGNOSIS — G259 Extrapyramidal and movement disorder, unspecified: Secondary | ICD-10-CM | POA: Diagnosis not present

## 2014-10-06 DIAGNOSIS — E780 Pure hypercholesterolemia: Secondary | ICD-10-CM | POA: Diagnosis not present

## 2014-10-06 DIAGNOSIS — K589 Irritable bowel syndrome without diarrhea: Secondary | ICD-10-CM | POA: Diagnosis not present

## 2014-10-06 DIAGNOSIS — M797 Fibromyalgia: Secondary | ICD-10-CM | POA: Diagnosis not present

## 2014-10-06 DIAGNOSIS — Z1389 Encounter for screening for other disorder: Secondary | ICD-10-CM | POA: Diagnosis not present

## 2014-10-30 DIAGNOSIS — H2513 Age-related nuclear cataract, bilateral: Secondary | ICD-10-CM | POA: Diagnosis not present

## 2014-11-10 DIAGNOSIS — Z1231 Encounter for screening mammogram for malignant neoplasm of breast: Secondary | ICD-10-CM | POA: Diagnosis not present

## 2014-12-11 ENCOUNTER — Encounter: Payer: Self-pay | Admitting: *Deleted

## 2014-12-11 ENCOUNTER — Ambulatory Visit (INDEPENDENT_AMBULATORY_CARE_PROVIDER_SITE_OTHER): Payer: Medicare Other | Admitting: Pulmonary Disease

## 2014-12-11 VITALS — BP 132/86 | HR 69 | Ht 64.0 in | Wt 208.8 lb

## 2014-12-11 DIAGNOSIS — K224 Dyskinesia of esophagus: Secondary | ICD-10-CM | POA: Insufficient documentation

## 2014-12-11 DIAGNOSIS — K219 Gastro-esophageal reflux disease without esophagitis: Secondary | ICD-10-CM

## 2014-12-11 DIAGNOSIS — J309 Allergic rhinitis, unspecified: Secondary | ICD-10-CM

## 2014-12-11 DIAGNOSIS — G4733 Obstructive sleep apnea (adult) (pediatric): Secondary | ICD-10-CM

## 2014-12-11 DIAGNOSIS — R059 Cough, unspecified: Secondary | ICD-10-CM | POA: Insufficient documentation

## 2014-12-11 DIAGNOSIS — I10 Essential (primary) hypertension: Secondary | ICD-10-CM | POA: Insufficient documentation

## 2014-12-11 DIAGNOSIS — R05 Cough: Secondary | ICD-10-CM | POA: Diagnosis not present

## 2014-12-11 DIAGNOSIS — Z86010 Personal history of colonic polyps: Secondary | ICD-10-CM | POA: Insufficient documentation

## 2014-12-11 DIAGNOSIS — M797 Fibromyalgia: Secondary | ICD-10-CM | POA: Insufficient documentation

## 2014-12-11 DIAGNOSIS — F411 Generalized anxiety disorder: Secondary | ICD-10-CM | POA: Insufficient documentation

## 2014-12-11 DIAGNOSIS — E785 Hyperlipidemia, unspecified: Secondary | ICD-10-CM | POA: Insufficient documentation

## 2014-12-11 DIAGNOSIS — E559 Vitamin D deficiency, unspecified: Secondary | ICD-10-CM | POA: Insufficient documentation

## 2014-12-11 DIAGNOSIS — K589 Irritable bowel syndrome without diarrhea: Secondary | ICD-10-CM | POA: Insufficient documentation

## 2014-12-11 MED ORDER — RANITIDINE HCL 150 MG PO TABS: 150.0000 mg | ORAL_TABLET | Freq: Every day | ORAL | Status: DC

## 2014-12-11 NOTE — Patient Instructions (Signed)
1. Avoid eating or drinking excessive fluids within 3 hours of bedtime. 2. Continue taking her Protonix and Carafate as prescribed. We are starting you on Zantac at night before bedtime to help with reflux and cough. 3. I will see back in 4 weeks. If her cough has totally resolved feel free to cancel this appointment. If you have any new breathing problems feel free to call my office and let me know.

## 2014-12-11 NOTE — Progress Notes (Signed)
Subjective:    Patient ID: Doris Reyes, female    DOB: Aug 06, 1950, 64 y.o.   MRN: [id number]  HPI She reports she has had infrequent bronchitis. She denies any childhood breathing problems or asthma. In reviewing the patient's outside medical records she was seen in July and noted to have sinus congestion for 2-3 weeks. The patient had previously been seen by Dr. Fredderick Phenix and found to have food and pollen allergies as well as purulent drainage. Patient has known gastroesophageal reflux disease without esophagitis that is treated with Protonix once daily & Carafate. She reports she has had a cough preceding removal of her gallbladder in 2004. She reports her cough seems to be getting worse. She reports her cough wakes her up at night. It is intermiittently productive of a "clear,white" mucus. She reports she does sometimes have post-tussive emesis. She reports she has a known hiatal hernia that has been surgically corrected by a Nissen. She previously was diagnosed with OSA but was intolerant of NIPPV with nausea & emesis. She reports a "burning pain" below her right scapula that started recently. She reports this is relieved with Voltaren gel. Sometimes this pain also precipitates nausea & emesis. She denies any dysphagia or odynophagia. She reports morning brash water taste on a daily basis when she wakes up. She reports frequent chest pain that has been attributed to esophageal spasms as well as anxiety. She reports she has had 2 prior left heart catheterizations without any endovascular blockages. She denies any wheezing. She reports she is able to "push mow" her yard for 18min-1 hour continuously without any dyspnea. She does notice significant sinus drainage that triggers her cough as well. She takes Zyrtec & Allegra to help with her sinus allergies. She takes another OTC sinus medication as well and sees an Allergist (Dr. Fredderick Phenix). Denies any fever, chills, or sweats. No weight loss. No  adenopathy in her neck, groin, or axilla.  Review of Systems No dysuria or hematuria. No rashes. Does bruise easily. She reports muscle pain from her fibromyalgia but otherwise no joint pain, swelling, erythema, or stiffness. A pertinent 14 point review of systems is negative except as per the history of presenting illness.  Allergies  Allergen Reactions  . Ceftin [Cefuroxime Axetil] Diarrhea  . Pneumococcal Vaccines     Rash, tender, swelling in the injection site  . Tramadol Hcl Other (See Comments)   Current Outpatient Prescriptions on File Prior to Visit  Medication Sig Dispense Refill  . acetaminophen (TYLENOL) 500 MG tablet Take 500 mg by mouth as needed.      . ALPRAZolam (XANAX) 0.25 MG tablet Half tablet- 1 at bedtime as needed    . aspirin 81 MG tablet Take 81 mg by mouth daily.      Marland Kitchen atenolol (TENORMIN) 50 MG tablet Take 50 mg by mouth daily.      . benzonatate (TESSALON) 100 MG capsule Take 100 mg by mouth 3 (three) times daily as needed.      . Cholecalciferol (VITAMIN D) 1000 UNITS capsule Take 1,000 Units by mouth daily.      Marland Kitchen dicyclomine (BENTYL) 10 MG capsule Take 10 mg by mouth 4 (four) times daily -  before meals and at bedtime. As needed    . DULoxetine (CYMBALTA) 60 MG capsule Take 120 mg by mouth daily.     . fexofenadine (ALLEGRA) 180 MG tablet Take 180 mg by mouth daily.    . fluticasone (VERAMYST) 27.5 MCG/SPRAY nasal spray Place  2 sprays into the nose daily. 50 mcg     . nitroGLYCERIN (NITROSTAT) 0.4 MG SL tablet Place 0.4 mg under the tongue every 5 (five) minutes as needed.      . Probiotic Product (PROBIOTIC & ACIDOPHILUS EX ST PO) Take by mouth. Patient taking "Ultra Flora Plus DF Capsules" instead of Align.     . simvastatin (ZOCOR) 10 MG tablet Take 10 mg by mouth at bedtime.      . vitamin C (ASCORBIC ACID) 500 MG tablet Take 500 mg by mouth daily.      . vitamin E 400 UNIT capsule Take 400 Units by mouth daily.       No current facility-administered  medications on file prior to visit.   Past Medical History  Diagnosis Date  . Fibromyalgia   . GERD (gastroesophageal reflux disease)   . Hypertension   . Hyperlipidemia   . IBS (irritable bowel syndrome)   . Osteoporosis   . Sleep apnea   . History of colonic polyps   . Ischemic colitis 03/2012   Past Surgical History  Procedure Laterality Date  . Tonsillectomy  1960's  . Total abdominal hysterectomy  2001  . Cholecystectomy    . Paraesophageal hernia repair  09/11/2009    and Nissen fundoplication  . Flexible sigmoidoscopy  04/20/2012    Procedure: FLEXIBLE SIGMOIDOSCOPY;  Surgeon: Beryle Beams, MD;  Location: [facility];  Service: Endoscopy;  Laterality: N/A;  . Nissen fundoplication  1194   Family History  Problem Relation Age of Onset  . Heart attack Father   . Colon cancer Mother   . Cancer Maternal Aunt     x2  . Kidney disease Maternal Uncle   . Atrial fibrillation Maternal Uncle   . Lung disease Neg Hx   . Rheum arthritis Other    Social History   Social History  . Marital Status: Married    Spouse Name: N/A  . Number of Children: 1  . Years of Education: N/A   Occupational History  . disabled    Social History Main Topics  . Smoking status: Never Smoker   . Smokeless tobacco: None     Comment: brief exposure through her husband  . Alcohol Use: No  . Drug Use: No  . Sexual Activity: Not Asked   Other Topics Concern  . None   Social History Narrative   From [place] originally. Previously lived in [place]. Previously worked at [employer] also in a [employer]. Has also worked as a Network engineer for [employer]. No pets currently. No bird exposure. No known mold in her current home.       Objective:   Physical Exam Blood pressure 132/86, pulse 69, height 5\' 4"  (1.626 m), weight 208 lb 12.8 oz (94.711 kg), SpO2 98 %. General:  Awake. Alert. No acute distress. Obese female.  Integument:  Warm & dry. No rash on exposed skin. No  bruising. Lymphatics:  No appreciated cervical or supraclavicular lymphadenoapthy. HEENT:  Moist mucus membranes. No oral ulcers. No scleral injection or icterus. Minimal nasal turbinate swelling. PERRL. Cardiovascular:  Regular rate. No edema. 3/6 systolic ejection murmur. No appreciable JVD.  Pulmonary:  Good aeration & clear to auscultation bilaterally. Symmetric chest wall expansion. No accessory muscle use on room air. Abdomen: Soft. Normal bowel sounds. Protuberant. Grossly nontender. Musculoskeletal:  Normal bulk and tone. Hand grip strength 5/5 bilaterally. No joint deformity or effusion appreciated. Neurological:  CN 2-12 grossly in tact. No meningismus. Moving  all 4 extremities equally. Symmetric patellar deep tendon reflexes. Psychiatric:  Mood and affect congruent. Speech normal rhythm, rate & tone.     Assessment & Plan:  64 year old female with a long history of GERD & hiatal hernia status post Nissen fundoplication. The patient really has minimal to no respiratory symptoms. She does endorse a prior history of infrequent bronchitis. I do believe her coughing spells are due to uncontrolled laryngo-esophageal reflux, especially since they're primarily happening at night. She reports no functional limitations to exertion from dyspnea. Certainly her postnasal drainage from her allergic rhinitis could be contributing to her cough as well. She does have follow-up with a local allergist. I instructed the patient to contact my office if she had any new breathing problems before her next appointment and that she could cancel her follow-up appointment if her cough completely resolves with the adjustments we're making in her reflux regimen today.  1. Cough: Increasing treatment for GERD. If cough persists consider pulmonary function testing at follow-up appointment. 2. GERD: Continuing Protonix & Carafate. Patient counseled to avoid eating or excessive fluid intake within 3 hours of bedtime.  Starting Zantac 150 milligrams by mouth daily at bedtime. 3. Allergic rhinitis: Patient has follow-up with allergy. 4. OSA: Currently not on CPAP therapy. Patient previously intolerant due to nausea and vomiting. 5. Follow-up: Patient will return to clinic in 4 weeks or cancel this appointment if her cough totally resolves.

## 2015-01-15 ENCOUNTER — Ambulatory Visit: Admitting: Pulmonary Disease

## 2015-01-23 DIAGNOSIS — Z23 Encounter for immunization: Secondary | ICD-10-CM | POA: Diagnosis not present

## 2015-03-04 ENCOUNTER — Other Ambulatory Visit: Payer: Self-pay | Admitting: Pulmonary Disease

## 2015-04-06 ENCOUNTER — Other Ambulatory Visit: Payer: Self-pay | Admitting: Pulmonary Disease

## 2015-04-08 DIAGNOSIS — K219 Gastro-esophageal reflux disease without esophagitis: Secondary | ICD-10-CM | POA: Diagnosis not present

## 2015-04-08 DIAGNOSIS — I10 Essential (primary) hypertension: Secondary | ICD-10-CM | POA: Diagnosis not present

## 2015-04-08 DIAGNOSIS — F419 Anxiety disorder, unspecified: Secondary | ICD-10-CM | POA: Diagnosis not present

## 2015-04-08 DIAGNOSIS — M797 Fibromyalgia: Secondary | ICD-10-CM | POA: Diagnosis not present

## 2015-04-08 DIAGNOSIS — J309 Allergic rhinitis, unspecified: Secondary | ICD-10-CM | POA: Diagnosis not present

## 2015-04-08 DIAGNOSIS — E7800 Pure hypercholesterolemia, unspecified: Secondary | ICD-10-CM | POA: Diagnosis not present

## 2015-04-08 DIAGNOSIS — K589 Irritable bowel syndrome without diarrhea: Secondary | ICD-10-CM | POA: Diagnosis not present

## 2015-04-08 DIAGNOSIS — G259 Extrapyramidal and movement disorder, unspecified: Secondary | ICD-10-CM | POA: Diagnosis not present

## 2015-06-02 DIAGNOSIS — M26602 Left temporomandibular joint disorder, unspecified: Secondary | ICD-10-CM | POA: Diagnosis not present

## 2015-06-05 DIAGNOSIS — M26602 Left temporomandibular joint disorder, unspecified: Secondary | ICD-10-CM | POA: Diagnosis not present

## 2015-06-09 DIAGNOSIS — M26602 Left temporomandibular joint disorder, unspecified: Secondary | ICD-10-CM | POA: Diagnosis not present

## 2015-06-12 DIAGNOSIS — M26602 Left temporomandibular joint disorder, unspecified: Secondary | ICD-10-CM | POA: Diagnosis not present

## 2015-06-15 DIAGNOSIS — M26602 Left temporomandibular joint disorder, unspecified: Secondary | ICD-10-CM | POA: Diagnosis not present

## 2015-06-17 DIAGNOSIS — M26602 Left temporomandibular joint disorder, unspecified: Secondary | ICD-10-CM | POA: Diagnosis not present

## 2015-06-23 DIAGNOSIS — M26602 Left temporomandibular joint disorder, unspecified: Secondary | ICD-10-CM | POA: Diagnosis not present

## 2015-06-29 DIAGNOSIS — M26602 Left temporomandibular joint disorder, unspecified: Secondary | ICD-10-CM | POA: Diagnosis not present

## 2015-07-28 DIAGNOSIS — L219 Seborrheic dermatitis, unspecified: Secondary | ICD-10-CM | POA: Diagnosis not present

## 2015-07-28 DIAGNOSIS — B351 Tinea unguium: Secondary | ICD-10-CM | POA: Diagnosis not present

## 2015-09-04 DIAGNOSIS — J310 Chronic rhinitis: Secondary | ICD-10-CM | POA: Diagnosis not present

## 2015-09-04 DIAGNOSIS — R21 Rash and other nonspecific skin eruption: Secondary | ICD-10-CM | POA: Diagnosis not present

## 2015-09-04 DIAGNOSIS — Z91013 Allergy to seafood: Secondary | ICD-10-CM | POA: Diagnosis not present

## 2015-10-02 DIAGNOSIS — M8589 Other specified disorders of bone density and structure, multiple sites: Secondary | ICD-10-CM | POA: Diagnosis not present

## 2015-10-06 DIAGNOSIS — E7800 Pure hypercholesterolemia, unspecified: Secondary | ICD-10-CM | POA: Diagnosis not present

## 2015-10-06 DIAGNOSIS — F419 Anxiety disorder, unspecified: Secondary | ICD-10-CM | POA: Diagnosis not present

## 2015-10-06 DIAGNOSIS — M797 Fibromyalgia: Secondary | ICD-10-CM | POA: Diagnosis not present

## 2015-10-06 DIAGNOSIS — I10 Essential (primary) hypertension: Secondary | ICD-10-CM | POA: Diagnosis not present

## 2015-10-06 DIAGNOSIS — L309 Dermatitis, unspecified: Secondary | ICD-10-CM | POA: Diagnosis not present

## 2015-10-06 DIAGNOSIS — J309 Allergic rhinitis, unspecified: Secondary | ICD-10-CM | POA: Diagnosis not present

## 2015-10-06 DIAGNOSIS — M26609 Unspecified temporomandibular joint disorder, unspecified side: Secondary | ICD-10-CM | POA: Diagnosis not present

## 2015-10-06 DIAGNOSIS — K219 Gastro-esophageal reflux disease without esophagitis: Secondary | ICD-10-CM | POA: Diagnosis not present

## 2015-10-06 DIAGNOSIS — G259 Extrapyramidal and movement disorder, unspecified: Secondary | ICD-10-CM | POA: Diagnosis not present

## 2015-11-03 DIAGNOSIS — H524 Presbyopia: Secondary | ICD-10-CM | POA: Diagnosis not present

## 2015-11-03 DIAGNOSIS — H04123 Dry eye syndrome of bilateral lacrimal glands: Secondary | ICD-10-CM | POA: Diagnosis not present

## 2015-11-03 DIAGNOSIS — H2513 Age-related nuclear cataract, bilateral: Secondary | ICD-10-CM | POA: Diagnosis not present

## 2016-02-10 DIAGNOSIS — Z23 Encounter for immunization: Secondary | ICD-10-CM | POA: Diagnosis not present

## 2016-04-01 DIAGNOSIS — Z1231 Encounter for screening mammogram for malignant neoplasm of breast: Secondary | ICD-10-CM | POA: Diagnosis not present

## 2016-04-07 DIAGNOSIS — E7800 Pure hypercholesterolemia, unspecified: Secondary | ICD-10-CM | POA: Diagnosis not present

## 2016-04-07 DIAGNOSIS — R5382 Chronic fatigue, unspecified: Secondary | ICD-10-CM | POA: Diagnosis not present

## 2016-04-07 DIAGNOSIS — G259 Extrapyramidal and movement disorder, unspecified: Secondary | ICD-10-CM | POA: Diagnosis not present

## 2016-04-07 DIAGNOSIS — M797 Fibromyalgia: Secondary | ICD-10-CM | POA: Diagnosis not present

## 2016-04-07 DIAGNOSIS — F419 Anxiety disorder, unspecified: Secondary | ICD-10-CM | POA: Diagnosis not present

## 2016-04-07 DIAGNOSIS — K219 Gastro-esophageal reflux disease without esophagitis: Secondary | ICD-10-CM | POA: Diagnosis not present

## 2016-04-07 DIAGNOSIS — B351 Tinea unguium: Secondary | ICD-10-CM | POA: Diagnosis not present

## 2016-04-07 DIAGNOSIS — I10 Essential (primary) hypertension: Secondary | ICD-10-CM | POA: Diagnosis not present

## 2016-06-23 DIAGNOSIS — G4733 Obstructive sleep apnea (adult) (pediatric): Secondary | ICD-10-CM | POA: Diagnosis not present

## 2016-08-15 DIAGNOSIS — J0110 Acute frontal sinusitis, unspecified: Secondary | ICD-10-CM | POA: Diagnosis not present

## 2016-08-15 DIAGNOSIS — G459 Transient cerebral ischemic attack, unspecified: Secondary | ICD-10-CM | POA: Diagnosis not present

## 2016-08-18 ENCOUNTER — Other Ambulatory Visit: Payer: Self-pay | Admitting: Family Medicine

## 2016-08-18 ENCOUNTER — Telehealth (HOSPITAL_COMMUNITY): Payer: Self-pay | Admitting: Family Medicine

## 2016-08-18 DIAGNOSIS — G459 Transient cerebral ischemic attack, unspecified: Secondary | ICD-10-CM

## 2016-08-19 ENCOUNTER — Other Ambulatory Visit: Payer: Self-pay | Admitting: Family Medicine

## 2016-08-19 DIAGNOSIS — G459 Transient cerebral ischemic attack, unspecified: Secondary | ICD-10-CM

## 2016-08-19 NOTE — Telephone Encounter (Signed)
08/18/2016 09:17 AM Phone (Outgoing) Alianah, Lofton (Self) [phone] (H)   Left Message - Called pt and lmsg for her to CB to get scheduled for echo.     By Verdene Rio

## 2016-08-29 DIAGNOSIS — Z91013 Allergy to seafood: Secondary | ICD-10-CM | POA: Diagnosis not present

## 2016-08-29 DIAGNOSIS — R21 Rash and other nonspecific skin eruption: Secondary | ICD-10-CM | POA: Diagnosis not present

## 2016-08-29 DIAGNOSIS — T781XXA Other adverse food reactions, not elsewhere classified, initial encounter: Secondary | ICD-10-CM | POA: Diagnosis not present

## 2016-08-29 DIAGNOSIS — J310 Chronic rhinitis: Secondary | ICD-10-CM | POA: Diagnosis not present

## 2016-09-02 ENCOUNTER — Ambulatory Visit
Admission: RE | Admit: 2016-09-02 | Discharge: 2016-09-02 | Disposition: A | Discharge status: Home / Self Care | Payer: Medicare Other | Source: Ambulatory Visit | Attending: Family Medicine | Admitting: Family Medicine

## 2016-09-02 ENCOUNTER — Ambulatory Visit (HOSPITAL_COMMUNITY): Payer: Medicare Other | Attending: Cardiovascular Disease

## 2016-09-02 ENCOUNTER — Other Ambulatory Visit: Payer: Self-pay

## 2016-09-02 DIAGNOSIS — G459 Transient cerebral ischemic attack, unspecified: Secondary | ICD-10-CM

## 2016-09-02 DIAGNOSIS — E669 Obesity, unspecified: Secondary | ICD-10-CM | POA: Diagnosis not present

## 2016-09-02 DIAGNOSIS — I10 Essential (primary) hypertension: Secondary | ICD-10-CM | POA: Diagnosis not present

## 2016-09-02 DIAGNOSIS — I6782 Cerebral ischemia: Secondary | ICD-10-CM | POA: Diagnosis not present

## 2016-09-02 DIAGNOSIS — Z6835 Body mass index (BMI) 35.0-35.9, adult: Secondary | ICD-10-CM | POA: Diagnosis not present

## 2016-09-02 DIAGNOSIS — E785 Hyperlipidemia, unspecified: Secondary | ICD-10-CM | POA: Insufficient documentation

## 2016-09-02 DIAGNOSIS — R4781 Slurred speech: Secondary | ICD-10-CM | POA: Diagnosis not present

## 2016-10-05 DIAGNOSIS — F419 Anxiety disorder, unspecified: Secondary | ICD-10-CM | POA: Diagnosis not present

## 2016-10-05 DIAGNOSIS — K219 Gastro-esophageal reflux disease without esophagitis: Secondary | ICD-10-CM | POA: Diagnosis not present

## 2016-10-05 DIAGNOSIS — E7800 Pure hypercholesterolemia, unspecified: Secondary | ICD-10-CM | POA: Diagnosis not present

## 2016-10-05 DIAGNOSIS — Z1389 Encounter for screening for other disorder: Secondary | ICD-10-CM | POA: Diagnosis not present

## 2016-10-05 DIAGNOSIS — G4733 Obstructive sleep apnea (adult) (pediatric): Secondary | ICD-10-CM | POA: Diagnosis not present

## 2016-10-05 DIAGNOSIS — M797 Fibromyalgia: Secondary | ICD-10-CM | POA: Diagnosis not present

## 2016-10-05 DIAGNOSIS — G259 Extrapyramidal and movement disorder, unspecified: Secondary | ICD-10-CM | POA: Diagnosis not present

## 2016-10-05 DIAGNOSIS — I10 Essential (primary) hypertension: Secondary | ICD-10-CM | POA: Diagnosis not present

## 2016-10-12 DIAGNOSIS — G4733 Obstructive sleep apnea (adult) (pediatric): Secondary | ICD-10-CM | POA: Diagnosis not present

## 2016-11-07 DIAGNOSIS — H2513 Age-related nuclear cataract, bilateral: Secondary | ICD-10-CM | POA: Diagnosis not present

## 2016-11-07 DIAGNOSIS — H524 Presbyopia: Secondary | ICD-10-CM | POA: Diagnosis not present

## 2016-11-07 DIAGNOSIS — H04123 Dry eye syndrome of bilateral lacrimal glands: Secondary | ICD-10-CM | POA: Diagnosis not present

## 2016-12-26 DIAGNOSIS — Z23 Encounter for immunization: Secondary | ICD-10-CM | POA: Diagnosis not present

## 2017-02-04 DIAGNOSIS — Z79899 Other long term (current) drug therapy: Secondary | ICD-10-CM | POA: Diagnosis not present

## 2017-02-04 DIAGNOSIS — E861 Hypovolemia: Secondary | ICD-10-CM | POA: Diagnosis not present

## 2017-02-04 DIAGNOSIS — K580 Irritable bowel syndrome with diarrhea: Secondary | ICD-10-CM | POA: Diagnosis not present

## 2017-02-04 DIAGNOSIS — K567 Ileus, unspecified: Secondary | ICD-10-CM | POA: Diagnosis not present

## 2017-02-04 DIAGNOSIS — E785 Hyperlipidemia, unspecified: Secondary | ICD-10-CM | POA: Diagnosis not present

## 2017-02-04 DIAGNOSIS — G4730 Sleep apnea, unspecified: Secondary | ICD-10-CM | POA: Diagnosis not present

## 2017-02-04 DIAGNOSIS — Z881 Allergy status to other antibiotic agents status: Secondary | ICD-10-CM | POA: Diagnosis not present

## 2017-02-04 DIAGNOSIS — R55 Syncope and collapse: Secondary | ICD-10-CM | POA: Diagnosis not present

## 2017-02-04 DIAGNOSIS — I10 Essential (primary) hypertension: Secondary | ICD-10-CM | POA: Diagnosis not present

## 2017-02-04 DIAGNOSIS — D72829 Elevated white blood cell count, unspecified: Secondary | ICD-10-CM | POA: Diagnosis not present

## 2017-02-04 DIAGNOSIS — K589 Irritable bowel syndrome without diarrhea: Secondary | ICD-10-CM | POA: Diagnosis not present

## 2017-02-04 DIAGNOSIS — M797 Fibromyalgia: Secondary | ICD-10-CM | POA: Diagnosis not present

## 2017-02-04 DIAGNOSIS — E860 Dehydration: Secondary | ICD-10-CM | POA: Diagnosis not present

## 2017-02-04 DIAGNOSIS — I959 Hypotension, unspecified: Secondary | ICD-10-CM | POA: Diagnosis not present

## 2017-02-04 DIAGNOSIS — A0472 Enterocolitis due to Clostridium difficile, not specified as recurrent: Secondary | ICD-10-CM | POA: Diagnosis not present

## 2017-02-04 DIAGNOSIS — Z7982 Long term (current) use of aspirin: Secondary | ICD-10-CM | POA: Diagnosis not present

## 2017-02-04 DIAGNOSIS — R1084 Generalized abdominal pain: Secondary | ICD-10-CM | POA: Diagnosis not present

## 2017-02-05 DIAGNOSIS — A0472 Enterocolitis due to Clostridium difficile, not specified as recurrent: Secondary | ICD-10-CM | POA: Diagnosis not present

## 2017-02-05 DIAGNOSIS — K580 Irritable bowel syndrome with diarrhea: Secondary | ICD-10-CM | POA: Diagnosis not present

## 2017-02-08 ENCOUNTER — Other Ambulatory Visit: Payer: Self-pay | Admitting: Family Medicine

## 2017-02-08 ENCOUNTER — Ambulatory Visit
Admission: RE | Admit: 2017-02-08 | Discharge: 2017-02-08 | Disposition: A | Discharge status: Home / Self Care | Payer: Medicare Other | Source: Ambulatory Visit | Attending: Family Medicine | Admitting: Family Medicine

## 2017-02-08 DIAGNOSIS — R059 Cough, unspecified: Secondary | ICD-10-CM

## 2017-02-08 DIAGNOSIS — R05 Cough: Secondary | ICD-10-CM

## 2017-02-08 DIAGNOSIS — K591 Functional diarrhea: Secondary | ICD-10-CM | POA: Diagnosis not present

## 2017-02-14 DIAGNOSIS — Z800 Family history of malignant neoplasm of digestive organs: Secondary | ICD-10-CM | POA: Diagnosis not present

## 2017-02-14 DIAGNOSIS — Z86010 Personal history of colonic polyps: Secondary | ICD-10-CM | POA: Diagnosis not present

## 2017-02-14 DIAGNOSIS — R194 Change in bowel habit: Secondary | ICD-10-CM | POA: Diagnosis not present

## 2017-02-14 DIAGNOSIS — K219 Gastro-esophageal reflux disease without esophagitis: Secondary | ICD-10-CM | POA: Diagnosis not present

## 2017-03-16 ENCOUNTER — Other Ambulatory Visit: Payer: Self-pay | Admitting: Gastroenterology

## 2017-03-16 DIAGNOSIS — D509 Iron deficiency anemia, unspecified: Secondary | ICD-10-CM | POA: Diagnosis not present

## 2017-03-16 DIAGNOSIS — Z800 Family history of malignant neoplasm of digestive organs: Secondary | ICD-10-CM | POA: Diagnosis not present

## 2017-03-16 DIAGNOSIS — Z1211 Encounter for screening for malignant neoplasm of colon: Secondary | ICD-10-CM | POA: Diagnosis not present

## 2017-03-16 DIAGNOSIS — K219 Gastro-esophageal reflux disease without esophagitis: Secondary | ICD-10-CM | POA: Diagnosis not present

## 2017-04-03 DIAGNOSIS — K589 Irritable bowel syndrome without diarrhea: Secondary | ICD-10-CM | POA: Diagnosis not present

## 2017-04-03 DIAGNOSIS — I10 Essential (primary) hypertension: Secondary | ICD-10-CM | POA: Diagnosis not present

## 2017-04-03 DIAGNOSIS — M797 Fibromyalgia: Secondary | ICD-10-CM | POA: Diagnosis not present

## 2017-04-03 DIAGNOSIS — F5101 Primary insomnia: Secondary | ICD-10-CM | POA: Diagnosis not present

## 2017-04-03 DIAGNOSIS — R05 Cough: Secondary | ICD-10-CM | POA: Diagnosis not present

## 2017-04-03 DIAGNOSIS — G4733 Obstructive sleep apnea (adult) (pediatric): Secondary | ICD-10-CM | POA: Diagnosis not present

## 2017-04-03 DIAGNOSIS — E7800 Pure hypercholesterolemia, unspecified: Secondary | ICD-10-CM | POA: Diagnosis not present

## 2017-04-03 DIAGNOSIS — Z1231 Encounter for screening mammogram for malignant neoplasm of breast: Secondary | ICD-10-CM | POA: Diagnosis not present

## 2017-04-03 DIAGNOSIS — K219 Gastro-esophageal reflux disease without esophagitis: Secondary | ICD-10-CM | POA: Diagnosis not present

## 2017-04-03 DIAGNOSIS — R5383 Other fatigue: Secondary | ICD-10-CM | POA: Diagnosis not present

## 2017-04-03 DIAGNOSIS — G259 Extrapyramidal and movement disorder, unspecified: Secondary | ICD-10-CM | POA: Diagnosis not present

## 2017-04-06 ENCOUNTER — Encounter (HOSPITAL_COMMUNITY): Payer: Self-pay | Admitting: Emergency Medicine

## 2017-04-10 ENCOUNTER — Encounter (HOSPITAL_COMMUNITY): Payer: Self-pay | Admitting: Emergency Medicine

## 2017-04-10 ENCOUNTER — Other Ambulatory Visit: Payer: Self-pay

## 2017-04-17 NOTE — Anesthesia Preprocedure Evaluation (Addendum)
Anesthesia Evaluation  Patient identified by MRN, date of birth, ID band Patient awake    Reviewed: Allergy & Precautions, NPO status , Patient's Chart, lab work & pertinent test results, reviewed documented beta blocker date and time   History of Anesthesia Complications (+) PROLONGED EMERGENCE  Airway Mallampati: IV  TM Distance: >3 FB Neck ROM: Full    Dental  (+) Dental Advisory Given, Teeth Intact   Pulmonary sleep apnea and Continuous Positive Airway Pressure Ventilation ,    Pulmonary exam normal breath sounds clear to auscultation       Cardiovascular hypertension, Pt. on medications and Pt. on home beta blockers Normal cardiovascular exam Rhythm:Regular Rate:Normal  TTE 2018 - Normal EF, grade 2 diastolic dysfx, PASP [id number]  Carotid US 2018 - <50% b/l ICAS   Neuro/Psych Anxiety negative neurological ROS     GI/Hepatic Neg liver ROS, GERD  Medicated and Controlled,IBS   Endo/Other  negative endocrine ROS  Renal/GU negative Renal ROS  negative genitourinary   Musculoskeletal  (+) Fibromyalgia -  Abdominal   Peds  Hematology negative hematology ROS (+)   Anesthesia Other Findings   Reproductive/Obstetrics                            Anesthesia Physical Anesthesia Plan  ASA: III  Anesthesia Plan: MAC   Post-op Pain Management:    Induction: Intravenous  PONV Risk Score and Plan: Propofol infusion and Treatment may vary due to age or medical condition  Airway Management Planned: Nasal Cannula  Additional Equipment: None  Intra-op Plan:   Post-operative Plan:   Informed Consent: I have reviewed the patients History and Physical, chart, labs and discussed the procedure including the risks, benefits and alternatives for the proposed anesthesia with the patient or authorized representative who has indicated his/her understanding and acceptance.   Dental advisory  given  Plan Discussed with: CRNA  Anesthesia Plan Comments:         Anesthesia Quick Evaluation

## 2017-04-18 ENCOUNTER — Ambulatory Visit (HOSPITAL_COMMUNITY): Payer: Medicare Other | Admitting: Certified Registered Nurse Anesthetist

## 2017-04-18 ENCOUNTER — Encounter (HOSPITAL_COMMUNITY)
Admission: RE | Disposition: A | Discharge status: Home / Self Care | Payer: Self-pay | Source: Ambulatory Visit | Attending: Gastroenterology

## 2017-04-18 ENCOUNTER — Encounter (HOSPITAL_COMMUNITY): Payer: Self-pay

## 2017-04-18 ENCOUNTER — Ambulatory Visit (HOSPITAL_COMMUNITY)
Admission: RE | Admit: 2017-04-18 | Discharge: 2017-04-18 | Disposition: A | Discharge status: Home / Self Care | Payer: Medicare Other | Source: Ambulatory Visit | Attending: Gastroenterology | Admitting: Gastroenterology

## 2017-04-18 ENCOUNTER — Other Ambulatory Visit: Payer: Self-pay

## 2017-04-18 DIAGNOSIS — D125 Benign neoplasm of sigmoid colon: Secondary | ICD-10-CM | POA: Diagnosis not present

## 2017-04-18 DIAGNOSIS — K210 Gastro-esophageal reflux disease with esophagitis: Secondary | ICD-10-CM | POA: Insufficient documentation

## 2017-04-18 DIAGNOSIS — Z79899 Other long term (current) drug therapy: Secondary | ICD-10-CM | POA: Diagnosis not present

## 2017-04-18 DIAGNOSIS — K635 Polyp of colon: Secondary | ICD-10-CM | POA: Diagnosis not present

## 2017-04-18 DIAGNOSIS — K589 Irritable bowel syndrome without diarrhea: Secondary | ICD-10-CM | POA: Diagnosis not present

## 2017-04-18 DIAGNOSIS — K5730 Diverticulosis of large intestine without perforation or abscess without bleeding: Secondary | ICD-10-CM | POA: Diagnosis not present

## 2017-04-18 DIAGNOSIS — Z7982 Long term (current) use of aspirin: Secondary | ICD-10-CM | POA: Diagnosis not present

## 2017-04-18 DIAGNOSIS — Z800 Family history of malignant neoplasm of digestive organs: Secondary | ICD-10-CM | POA: Diagnosis not present

## 2017-04-18 DIAGNOSIS — K2980 Duodenitis without bleeding: Secondary | ICD-10-CM | POA: Insufficient documentation

## 2017-04-18 DIAGNOSIS — I10 Essential (primary) hypertension: Secondary | ICD-10-CM | POA: Insufficient documentation

## 2017-04-18 DIAGNOSIS — E785 Hyperlipidemia, unspecified: Secondary | ICD-10-CM | POA: Insufficient documentation

## 2017-04-18 DIAGNOSIS — G4730 Sleep apnea, unspecified: Secondary | ICD-10-CM | POA: Insufficient documentation

## 2017-04-18 DIAGNOSIS — Z1211 Encounter for screening for malignant neoplasm of colon: Secondary | ICD-10-CM | POA: Diagnosis not present

## 2017-04-18 DIAGNOSIS — D509 Iron deficiency anemia, unspecified: Secondary | ICD-10-CM | POA: Insufficient documentation

## 2017-04-18 DIAGNOSIS — M797 Fibromyalgia: Secondary | ICD-10-CM | POA: Insufficient documentation

## 2017-04-18 DIAGNOSIS — Z90710 Acquired absence of both cervix and uterus: Secondary | ICD-10-CM | POA: Insufficient documentation

## 2017-04-18 HISTORY — PX: ESOPHAGOGASTRODUODENOSCOPY (EGD) WITH PROPOFOL: SHX5813

## 2017-04-18 HISTORY — DX: Adverse effect of unspecified anesthetic, initial encounter: T41.45XA

## 2017-04-18 HISTORY — PX: COLONOSCOPY WITH PROPOFOL: SHX5780

## 2017-04-18 HISTORY — DX: Other complications of anesthesia, initial encounter: T88.59XA

## 2017-04-18 SURGERY — ESOPHAGOGASTRODUODENOSCOPY (EGD) WITH PROPOFOL
Anesthesia: Monitor Anesthesia Care

## 2017-04-18 MED ORDER — ONDANSETRON HCL 4 MG/2ML IJ SOLN
INTRAMUSCULAR | Status: DC | PRN
  Administered 2017-04-18: 4 mg via INTRAVENOUS

## 2017-04-18 MED ORDER — PROPOFOL 10 MG/ML IV BOLUS
INTRAVENOUS | Status: AC
  Filled 2017-04-18: qty 20

## 2017-04-18 MED ORDER — PROPOFOL 500 MG/50ML IV EMUL
INTRAVENOUS | Status: DC | PRN
  Administered 2017-04-18: 150 ug/kg/min via INTRAVENOUS

## 2017-04-18 MED ORDER — SODIUM CHLORIDE 0.9 % IV SOLN: INTRAVENOUS | Status: DC

## 2017-04-18 MED ORDER — PROPOFOL 10 MG/ML IV BOLUS
INTRAVENOUS | Status: DC | PRN
  Administered 2017-04-18: 20 mg via INTRAVENOUS
  Administered 2017-04-18: 30 mg via INTRAVENOUS
  Administered 2017-04-18: 20 mg via INTRAVENOUS

## 2017-04-18 MED ORDER — PROPOFOL 10 MG/ML IV BOLUS
INTRAVENOUS | Status: AC
  Filled 2017-04-18: qty 40

## 2017-04-18 MED ORDER — PHENYLEPHRINE HCL 10 MG/ML IJ SOLN
INTRAMUSCULAR | Status: DC | PRN
  Administered 2017-04-18: 80 ug via INTRAVENOUS

## 2017-04-18 MED ORDER — LIDOCAINE 2% (20 MG/ML) 5 ML SYRINGE
INTRAMUSCULAR | Status: DC | PRN
  Administered 2017-04-18: 100 mg via INTRAVENOUS

## 2017-04-18 MED ORDER — LACTATED RINGERS IV SOLN
INTRAVENOUS | Status: DC | PRN
  Administered 2017-04-18: 07:00:00 via INTRAVENOUS

## 2017-04-18 SURGICAL SUPPLY — 24 items

## 2017-04-18 NOTE — Op Note (Signed)
[facility] Patient Name: Doris Reyes Procedure Date: 04/18/2017 MRN: [id number] Attending MD: [name] , MD Date of Birth: 1950/06/18 CSN: [id number] Age: 67 Admit Type: Outpatient Procedure:                Colonoscopy with cold snare polypectomy x 1. Indications:              Family history of colon cancer-mother; CRC                            screening for colorectal malignant neoplasm. Providers:                [name], MD, Cleda Daub, RN, Nevin Bloodgood,                            Technician, Christell Faith, CRNA. Referring MD:             Blythe Stanford, MD Medicines:                Monitored Anesthesia Care. Complications:            No immediate complications. Estimated Blood Loss:     Estimated blood loss was minimal. Procedure:                Pre-anesthesia assessment: - Prior to the                            procedure, a history and physical was performed,                            and patient medications and allergies were                            reviewed. The patient's tolerance of previous                            anesthesia was also reviewed. The risks and                            benefits of the procedure and the sedation options                            and risks were discussed with the patient. All                            questions were answered, and informed consent was                            obtained. Prior anticoagulants: The patient has                            taken aspirin, last dose was 5 days prior to                            procedure. ASA Grade assessment: III - A patient  with severe systemic disease. After reviewing the                            risks and benefits, the patient was deemed in                            satisfactory condition to undergo the procedure. -                            Prior to the procedure, a History and Physical was   performed, and patient medications and allergies                            were reviewed. The patient's tolerance of previous                            anesthesia was also reviewed. The risks and                            benefits of the procedure and the sedation options                            and risks were discussed with the patient. All                            questions were answered, and informed consent was                            obtained. Prior anticoagulants: The patient has                            taken aspirin, last dose was 5 days prior to                            procedure. ASA Grade Assessment: III - A patient                            with severe systemic disease. After reviewing the                            risks and benefits, the patient was deemed in                            satisfactory condition to undergo the procedure.                            After obtaining informed consent, the colonoscope                            was passed under direct vision. Throughout the                            procedure, the patient's blood pressure, pulse, and  oxygen saturations were monitored continuously. The                            [id number] ([id number]) scope was introduced through                            the anus and advanced to the the terminal ileum,                            with identification of the appendiceal orifice and                            IC valve. The colonoscopy was performed with                            moderate difficulty due to inadequate bowel prep.                            Completion of the procedure was aided by lavage.                            The patient tolerated the procedure well. The                            quality of the bowel preparation was fair. The                            terminal ileum, the ileocecal valve, the                            appendiceal orifice and the rectum were                             photographed. The bowel preparation used was                            GoLYTELY. Scope In: 7:46:02 AM Scope Out: 8:02:42 AM Scope Withdrawal Time: 0 hours 10 minutes 37 seconds  Total Procedure Duration: 0 hours 16 minutes 40 seconds  Findings:      A few medium-mouthed diverticula were found in the sigmoid colon.      A 6 mm sessile polyp was found in the sigmoid colon; the polyp was       removed with a cold snare x 1; resection and retrieval were complete.      The terminal ileum appeared normal.      The exam was otherwise without abnormality on direct and retroflexion       views. Impression:               - Preparation of the colon was fair at best inspite                            of aggressive lavage; polyps could be missed.                           -  Diverticulosis in the sigmoid colon.                           - One 6 mm polyp in the sigmoid colon, removed with                            a cold snare x 1; resected and retrieved.                           - The examined portion of the ileum was normal.                           - The examination was otherwise normal on direct                            and retroflexion views. Moderate Sedation:      MAC used. Recommendation:           - High fiber diet with augmented water consumption                            daily.                           - Continue present medications.                           - No Ibuprofen, Naproxen, or other non-steroidal                            anti-inflammatory drugs for 2 weeks after polyp                            removal.                           - Repeat colonoscopy in 5 years for surveillance                            with a 8 day prep.                           - Return to GI office in 4 weeks.                           - If the patient has any abnormal GI symptoms in                            the interim, she/he has been advised to call the                             office ASAP for further recommendations. Procedure Code(s):        --- Professional ---  45385, Colonoscopy, flexible; with removal of                            tumor(s), polyp(s), or other lesion(s) by snare                            technique Diagnosis Code(s):        --- Professional ---                           D12.5, Benign neoplasm of sigmoid colon                           Z80.0, Family history of malignant neoplasm of                            digestive organs                           Z12.11, Encounter for screening for malignant                            neoplasm of colon                           K57.30, Diverticulosis of large intestine without                            perforation or abscess without bleeding CPT copyright 2016 American Medical Association. All rights reserved. The codes documented in this report are preliminary and upon coder review may  be revised to meet current compliance requirements. [name], MD [name], MD 04/18/2017 8:25:39 AM This report has been signed electronically. Number of Addenda: 0

## 2017-04-18 NOTE — Anesthesia Procedure Notes (Signed)
Procedure Name: MAC Date/Time: 04/18/2017 7:29 AM Performed by: West Pugh, CRNA Pre-anesthesia Checklist: Patient identified, Emergency Drugs available, Suction available, Patient being monitored and Timeout performed Patient Re-evaluated:Patient Re-evaluated prior to induction Oxygen Delivery Method: Nasal cannula Placement Confirmation: CO2 detector and positive ETCO2 Dental Injury: Teeth and Oropharynx as per pre-operative assessment

## 2017-04-18 NOTE — H&P (Signed)
Doris Reyes is an 67 y.o. female.   Chief Complaint: Colorectal cancer screening/IDA HPI: 67 year old white female here from an EGD/Colonoscopy. See office notes for details.  Past Medical History:  Diagnosis Date  . Complication of anesthesia    difficulty waking up  . Fibromyalgia   . GERD (gastroesophageal reflux disease)   . History of colonic polyps   . Hyperlipidemia   . Hypertension   . IBS (irritable bowel syndrome)   . Ischemic colitis ([place]) 03/2012  . Osteoporosis   . Sleep apnea    Past Surgical History:  Procedure Laterality Date  . CHOLECYSTECTOMY    . FLEXIBLE SIGMOIDOSCOPY  04/20/2012   Procedure: FLEXIBLE SIGMOIDOSCOPY;  Surgeon: Beryle Beams, MD;  Location: [facility];  Service: Endoscopy;  Laterality: N/A;  . NISSEN FUNDOPLICATION  5631  . PARAESOPHAGEAL HERNIA REPAIR  09/11/2009   and Nissen fundoplication  . TONSILLECTOMY  1960's  . TOTAL ABDOMINAL HYSTERECTOMY  2001   Family History  Problem Relation Age of Onset  . Heart attack Father   . Colon cancer Mother   . Cancer Maternal Aunt        x2  . Kidney disease Maternal Uncle   . Atrial fibrillation Maternal Uncle   . Rheum arthritis Other   . Lung disease Neg Hx    Social History:  reports that  has never smoked. she has never used smokeless tobacco. She reports that she does not drink alcohol or use drugs.  Allergies:  Allergies  Allergen Reactions  . Ceftin [Cefuroxime Axetil] Diarrhea  . Pneumococcal Vaccines Other (See Comments)    Rash, tender, swelling in the injection site  . Tramadol Hcl Other (See Comments)   Medications Prior to Admission  Medication Sig Dispense Refill  . acetaminophen (TYLENOL) 500 MG tablet Take 500 mg by mouth every 6 (six) hours as needed for moderate pain or headache.     . ALPRAZolam (XANAX) 0.25 MG tablet Take 0.125-0.25 mg by mouth at bedtime as needed for anxiety.     Marland Kitchen amLODipine (NORVASC) 5 MG tablet Take 5 mg by mouth daily.    Marland Kitchen aspirin 81 MG  tablet Take 162 mg by mouth at bedtime.     Marland Kitchen atenolol (TENORMIN) 50 MG tablet Take 75 mg by mouth daily.     . benazepril (LOTENSIN) 10 MG tablet Take 10 mg by mouth daily.    . benzonatate (TESSALON) 100 MG capsule Take 100 mg by mouth at bedtime.     . Calcium Citrate-Vitamin D (CITRACAL + D PO) Take 1 tablet by mouth 2 (two) times daily.    . Cholecalciferol (VITAMIN D) 1000 UNITS capsule Take 2,000 Units by mouth daily.     . diclofenac sodium (VOLTAREN) 1 % GEL Apply 1 application topically daily as needed (for pain). Apply as directed    . dicyclomine (BENTYL) 10 MG capsule Take 10 mg by mouth 4 (four) times daily as needed for spasms.     . DULoxetine (CYMBALTA) 60 MG capsule Take 120 mg by mouth daily.     . fexofenadine (ALLEGRA) 180 MG tablet Take 180 mg by mouth daily.    . fluticasone (FLONASE) 50 MCG/ACT nasal spray Place 1 spray into both nostrils daily.    . Nutritional Supplements (JUICE PLUS FIBRE PO) Take 2 each by mouth 2 (two) times daily.     . Omega-3 Fatty Acids (FISH OIL) 1000 MG CAPS Take 1,000 mg by mouth daily.     Marland Kitchen  pantoprazole (PROTONIX) 40 MG tablet Take 40 mg by mouth daily.     Marland Kitchen Phenylephrine-APAP-Guaifenesin (TYLENOL SINUS SEVERE PO) Take 2 tablets by mouth daily as needed (for congestion).    . Polyvinyl Alcohol (LIQUID TEARS OP) Place 1 drop into both eyes 2 (two) times daily.    . Probiotic Product (PROBIOTIC & ACIDOPHILUS EX ST PO) Take 1 capsule by mouth daily. Ultra Flora Plus DF Capsules    . ranitidine (ZANTAC) 150 MG tablet Take 1 tablet (150 mg total) by mouth at bedtime. (Patient taking differently: Take 150 mg by mouth 2 (two) times daily as needed for heartburn. ) 30 tablet 3  . rOPINIRole (REQUIP) 1 MG tablet Take 1 mg by mouth at bedtime.    . simvastatin (ZOCOR) 10 MG tablet Take 10 mg by mouth at bedtime.      . sucralfate (CARAFATE) 1 GM/10ML suspension Take 1 g by mouth 4 (four) times daily.     . vitamin C (ASCORBIC ACID) 500 MG tablet Take  500 mg by mouth daily.      . vitamin E 400 UNIT capsule Take 400 Units by mouth daily.      . cetirizine (ZYRTEC ALLERGY) 10 MG tablet Take 10 mg by mouth daily as needed for allergies.     Marland Kitchen EPINEPHrine (EPIPEN 2-PAK) 0.3 mg/0.3 mL IJ SOAJ injection Inject 0.3 mg into the muscle once.     . nitroGLYCERIN (NITROSTAT) 0.4 MG SL tablet Place 0.4 mg under the tongue every 5 (five) minutes as needed for chest pain.      No results found for this or any previous visit (from the past 48 hour(s)). No results found.  ROS  Blood pressure (!) 123/43, pulse (!) 56, temperature 99.4 F (37.4 C), temperature source Oral, resp. rate 13, height 5\' 4"  (1.626 m), weight 94.3 kg (208 lb), SpO2 98 %. Physical Exam  Constitutional: She is oriented to person, place, and time. She appears well-developed and well-nourished.  HENT:  Head: Normocephalic and atraumatic.  Eyes: Conjunctivae and EOM are normal. Pupils are equal, round, and reactive to light.  Neck: Normal range of motion. Neck supple.  Cardiovascular: Normal rate and regular rhythm.  Respiratory: Effort normal and breath sounds normal.  GI: Soft. Bowel sounds are normal.  Neurological: She is alert and oriented to person, place, and time.  Skin: Skin is warm and dry.  Psychiatric: She has a normal mood and affect. Her behavior is normal. Judgment and thought content normal.    Assessment/Plan Colorectal cancer screening/IDA-proceed with an EGD/Colonoscopy at this time.  Loveah Like, MD 04/18/2017, 7:21 AM

## 2017-04-18 NOTE — Anesthesia Postprocedure Evaluation (Signed)
Anesthesia Post Note  Patient: Doris Reyes  Procedure(s) Performed: ESOPHAGOGASTRODUODENOSCOPY (EGD) WITH PROPOFOL (N/A ) COLONOSCOPY WITH PROPOFOL (N/A )     Patient location during evaluation: PACU Anesthesia Type: MAC Level of consciousness: awake and alert Pain management: pain level controlled Vital Signs Assessment: post-procedure vital signs reviewed and stable Respiratory status: spontaneous breathing, nonlabored ventilation and respiratory function stable Cardiovascular status: stable and blood pressure returned to baseline Anesthetic complications: no    Last Vitals:  Vitals:   04/18/17 0815 04/18/17 0830  BP: (!) 109/52 (!) 105/51  Pulse: 61 (!) 55  Resp: 15 14  Temp:    SpO2: 98% 100%    Last Pain:  Vitals:   04/18/17 0651  TempSrc: Oral                 Audry Pili

## 2017-04-18 NOTE — Op Note (Signed)
[facility] Patient Name: Evia Goldsmith Procedure Date: 04/18/2017 MRN: [id number] Attending MD: [name] , MD Date of Birth: 1951/01/30 CSN: [id number] Age: 67 Admit Type: Outpatient Procedure:                EGD with cold biopsies. Indications:              Iron deficiency anemia, Gastro-esophageal reflux                            disease. Providers:                [name], MD, Cleda Daub, RN, Nevin Bloodgood,                            Technician, Christell Faith, CRNA. Referring MD:             Blythe Stanford, MD Medicines:                Monitored Anesthesia Care Complications:            No immediate complications. Estimated Blood Loss:     Estimated blood loss: none. Procedure:                Pre-anesthesia assessment: - Prior to the                            procedure, a history and physical was performed,                            and patient medications and allergies were                            reviewed. The patient's tolerance of previous                            anesthesia was also reviewed. The risks and                            benefits of the procedure and the sedation options                            and risks were discussed with the patient. All                            questions were answered, and informed consent was                            obtained. Prior Anticoagulants: The patient has                            taken aspirin, last dose was 5 days prior to                            procedure. ASA Grade assessment: III - A patient  with severe systemic disease. After reviewing the                            risks and benefits, the patient was deemed in                            satisfactory condition to undergo the procedure.                            After obtaining informed consent, the endoscope was                            passed under direct vision. Throughout the                             procedure, the patient's blood pressure, pulse, and                            oxygen saturations were monitored continuously. The                            [id number] [id number]) scope was introduced through the                            mouth, and advanced to the second part of duodenum.                            The EGD was accomplished without difficulty. The                            patient tolerated the procedure well. Scope In: Scope Out: Findings:      LA Grade A (one or more mucosal breaks less than 5 mm, not extending       between tops of 2 mucosal folds) esophagitis with no bleeding was found.      The entire examined stomach was normal.      The cardia and gastric fundus were normal on retroflexion.      Diffuse mucosal flattening was found in the duodenal bulb and in the       first portion of the duodenum-biopsies were done to rule out celiac       sprue. Impression:               - LA Grade A reflux esophagitis.                           - Normal stomach.                           - Flattened mucosa was found in the duodenum,                            suspicious for celiac disease. Biopsied. Moderate Sedation:      MAC used by the CRNA supervised by the endoscopist. The following       parameters were monitored: oxygen saturation, heart  rate, blood       pressure, respiratory rate, EKG, adequacy of pulmonary ventilation, and       response to care. Total physician intraservice time was 8 minutes. Recommendation:           - High fiber diet with augmented water consumption                            daily.                           - Continue present medications.                           - Await pathology results.                           - Return to my office in 4 weeks. Procedure Code(s):        --- Professional ---                           [phone], Esophagogastroduodenoscopy, flexible,                            transoral; with biopsy, single or  multiple Diagnosis Code(s):        --- Professional ---                           D50.9, Iron deficiency anemia, unspecified                           K21.0, Gastro-esophageal reflux disease with                            esophagitis CPT copyright 2016 American [employer]. All rights reserved. The codes documented in this report are preliminary and upon coder review may  be revised to meet current compliance requirements. [name], MD [name], MD 04/18/2017 8:15:22 AM This report has been signed electronically. Number of Addenda: 0

## 2017-04-18 NOTE — Transfer of Care (Signed)
Immediate Anesthesia Transfer of Care Note  Patient: Doris Reyes  Procedure(s) Performed: ESOPHAGOGASTRODUODENOSCOPY (EGD) WITH PROPOFOL (N/A ) COLONOSCOPY WITH PROPOFOL (N/A )  Patient Location: PACU  Anesthesia Type:MAC  Level of Consciousness: awake, alert  and patient cooperative  Airway & Oxygen Therapy: Patient spontaneously breathing on nasal cannula.  Post-op Assessment: Report given to RN and Post -op Vital signs reviewed and stable  Post vital signs: Reviewed and stable  Last Vitals:  Vitals:   04/18/17 0651  BP: (!) 123/43  Pulse: (!) 56  Resp: 13  Temp: 37.4 C  SpO2: 98%    Last Pain:  Vitals:   04/18/17 0651  TempSrc: Oral         Complications: No apparent anesthesia complications

## 2017-04-18 NOTE — Discharge Instructions (Signed)
Esophagogastroduodenoscopy, Care After °Refer to this sheet in the next few weeks. These instructions provide you with information about caring for yourself after your procedure. Your health care provider may also give you more specific instructions. Your treatment has been planned according to current medical practices, but problems sometimes occur. Call your health care provider if you have any problems or questions after your procedure. °What can I expect after the procedure? °After the procedure, it is common to have: °· A sore throat. °· Nausea. °· Bloating. °· Dizziness. °· Fatigue. ° °Follow these instructions at home: °· Do not eat or drink anything until the numbing medicine (local anesthetic) has worn off and your gag reflex has returned. You will know that the local anesthetic has worn off when you can swallow comfortably. °· Do not drive for 24 hours if you received a medicine to help you relax (sedative). °· If your health care provider took a tissue sample for testing during the procedure, make sure to get your test results. This is your responsibility. Ask your health care provider or the department performing the test when your results will be ready. °· Keep all follow-up visits as told by your health care provider. This is important. °Contact a health care provider if: °· You cannot stop coughing. °· You are not urinating. °· You are urinating less than usual. °Get help right away if: °· You have trouble swallowing. °· You cannot eat or drink. °· You have throat or chest pain that gets worse. °· You are dizzy or light-headed. °· You faint. °· You have nausea or vomiting. °· You have chills. °· You have a fever. °· You have severe abdominal pain. °· You have black, tarry, or bloody stools. °This information is not intended to replace advice given to you by your health care provider. Make sure you discuss any questions you have with your health care provider. °Document Released: 02/29/2012 Document  Revised: 08/20/2015 Document Reviewed: 02/05/2015 °Elsevier Interactive Patient Education © 2018 [employer] °Colonoscopy, Adult, Care After °This sheet gives you information about how to care for yourself after your procedure. Your doctor may also give you more specific instructions. If you have problems or questions, call your doctor. °Follow these instructions at home: °General instructions ° °· For the first 24 hours after the procedure: °? Do not drive or use machinery. °? Do not sign important documents. °? Do not drink alcohol. °? Do your daily activities more slowly than normal. °? Eat foods that are soft and easy to digest. °? Rest often. °· Take over-the-counter or prescription medicines only as told by your doctor. °· It is up to you to get the results of your procedure. Ask your doctor, or the department performing the procedure, when your results will be ready. °To help cramping and bloating: °· Try walking around. °· Put heat on your belly (abdomen) as told by your doctor. Use a heat source that your doctor recommends, such as a moist heat pack or a heating pad. °? Put a towel between your skin and the heat source. °? Leave the heat on for 20-30 minutes. °? Remove the heat if your skin turns bright red. This is especially important if you cannot feel pain, heat, or cold. You can get burned. °Eating and drinking °· Drink enough fluid to keep your pee (urine) clear or pale yellow. °· Return to your normal diet as told by your doctor. Avoid heavy or fried foods that are hard to digest. °· Avoid   drinking alcohol for as long as told by your doctor. °Contact a doctor if: °· You have blood in your poop (stool) 2-3 days after the procedure. °Get help right away if: °· You have more than a small amount of blood in your poop. °· You see large clumps of tissue (blood clots) in your poop. °· Your belly is swollen. °· You feel sick to your stomach (nauseous). °· You throw up (vomit). °· You have a fever. °· You  have belly pain that gets worse, and medicine does not help your pain. °This information is not intended to replace advice given to you by your health care provider. Make sure you discuss any questions you have with your health care provider. °Document Released: 04/16/2010 Document Revised: 12/07/2015 Document Reviewed: 12/07/2015 °Elsevier Interactive Patient Education © 2017 [employer] ° °

## 2017-04-20 ENCOUNTER — Encounter (HOSPITAL_COMMUNITY): Payer: Self-pay | Admitting: Gastroenterology

## 2017-05-16 DIAGNOSIS — Z800 Family history of malignant neoplasm of digestive organs: Secondary | ICD-10-CM | POA: Diagnosis not present

## 2017-05-16 DIAGNOSIS — K5730 Diverticulosis of large intestine without perforation or abscess without bleeding: Secondary | ICD-10-CM | POA: Diagnosis not present

## 2017-05-16 DIAGNOSIS — K210 Gastro-esophageal reflux disease with esophagitis: Secondary | ICD-10-CM | POA: Diagnosis not present

## 2017-08-09 DIAGNOSIS — Z01419 Encounter for gynecological examination (general) (routine) without abnormal findings: Secondary | ICD-10-CM | POA: Diagnosis not present

## 2017-08-09 DIAGNOSIS — Z1272 Encounter for screening for malignant neoplasm of vagina: Secondary | ICD-10-CM | POA: Diagnosis not present

## 2017-08-09 DIAGNOSIS — Z779 Other contact with and (suspected) exposures hazardous to health: Secondary | ICD-10-CM | POA: Diagnosis not present

## 2017-08-09 DIAGNOSIS — Z90712 Acquired absence of cervix with remaining uterus: Secondary | ICD-10-CM | POA: Diagnosis not present

## 2017-08-09 DIAGNOSIS — Z6837 Body mass index (BMI) 37.0-37.9, adult: Secondary | ICD-10-CM | POA: Diagnosis not present

## 2017-08-09 DIAGNOSIS — R5383 Other fatigue: Secondary | ICD-10-CM | POA: Diagnosis not present

## 2017-08-16 DIAGNOSIS — R51 Headache: Secondary | ICD-10-CM | POA: Diagnosis not present

## 2017-08-16 DIAGNOSIS — L989 Disorder of the skin and subcutaneous tissue, unspecified: Secondary | ICD-10-CM | POA: Diagnosis not present

## 2017-08-25 DIAGNOSIS — R5383 Other fatigue: Secondary | ICD-10-CM | POA: Diagnosis not present

## 2017-08-25 DIAGNOSIS — D509 Iron deficiency anemia, unspecified: Secondary | ICD-10-CM | POA: Diagnosis not present

## 2017-08-29 DIAGNOSIS — J310 Chronic rhinitis: Secondary | ICD-10-CM | POA: Diagnosis not present

## 2017-08-29 DIAGNOSIS — R21 Rash and other nonspecific skin eruption: Secondary | ICD-10-CM | POA: Diagnosis not present

## 2017-08-29 DIAGNOSIS — R05 Cough: Secondary | ICD-10-CM | POA: Diagnosis not present

## 2017-08-29 DIAGNOSIS — Z91013 Allergy to seafood: Secondary | ICD-10-CM | POA: Diagnosis not present

## 2017-09-11 DIAGNOSIS — D509 Iron deficiency anemia, unspecified: Secondary | ICD-10-CM | POA: Diagnosis not present

## 2017-09-18 DIAGNOSIS — D509 Iron deficiency anemia, unspecified: Secondary | ICD-10-CM | POA: Diagnosis not present

## 2017-10-02 DIAGNOSIS — K589 Irritable bowel syndrome without diarrhea: Secondary | ICD-10-CM | POA: Diagnosis not present

## 2017-10-02 DIAGNOSIS — K219 Gastro-esophageal reflux disease without esophagitis: Secondary | ICD-10-CM | POA: Diagnosis not present

## 2017-10-02 DIAGNOSIS — G4733 Obstructive sleep apnea (adult) (pediatric): Secondary | ICD-10-CM | POA: Diagnosis not present

## 2017-10-02 DIAGNOSIS — Z23 Encounter for immunization: Secondary | ICD-10-CM | POA: Diagnosis not present

## 2017-10-02 DIAGNOSIS — F419 Anxiety disorder, unspecified: Secondary | ICD-10-CM | POA: Diagnosis not present

## 2017-10-02 DIAGNOSIS — E7800 Pure hypercholesterolemia, unspecified: Secondary | ICD-10-CM | POA: Diagnosis not present

## 2017-10-02 DIAGNOSIS — M797 Fibromyalgia: Secondary | ICD-10-CM | POA: Diagnosis not present

## 2017-10-02 DIAGNOSIS — R05 Cough: Secondary | ICD-10-CM | POA: Diagnosis not present

## 2017-10-02 DIAGNOSIS — I10 Essential (primary) hypertension: Secondary | ICD-10-CM | POA: Diagnosis not present

## 2017-10-02 DIAGNOSIS — F5101 Primary insomnia: Secondary | ICD-10-CM | POA: Diagnosis not present

## 2017-10-02 DIAGNOSIS — G259 Extrapyramidal and movement disorder, unspecified: Secondary | ICD-10-CM | POA: Diagnosis not present

## 2017-10-02 DIAGNOSIS — J309 Allergic rhinitis, unspecified: Secondary | ICD-10-CM | POA: Diagnosis not present

## 2017-10-11 DIAGNOSIS — G4733 Obstructive sleep apnea (adult) (pediatric): Secondary | ICD-10-CM | POA: Diagnosis not present

## 2017-11-07 DIAGNOSIS — H04123 Dry eye syndrome of bilateral lacrimal glands: Secondary | ICD-10-CM | POA: Diagnosis not present

## 2017-11-07 DIAGNOSIS — H524 Presbyopia: Secondary | ICD-10-CM | POA: Diagnosis not present

## 2017-11-07 DIAGNOSIS — H2513 Age-related nuclear cataract, bilateral: Secondary | ICD-10-CM | POA: Diagnosis not present

## 2018-01-01 DIAGNOSIS — Z23 Encounter for immunization: Secondary | ICD-10-CM | POA: Diagnosis not present

## 2018-01-01 IMAGING — MR MR HEAD W/O CM
10 series · 48 of 48 positions shown · non-contrast
Comparison: None.

CLINICAL DATA: Transient cerebral ischemia. One episode of Shehzad
eyelid and blurry vision with imbalance and dysphasia 08/10/2016.

EXAM:
MRI HEAD WITHOUT CONTRAST
TECHNIQUE: Multiplanar, multiecho pulse sequences of the brain and surrounding
structures were obtained without intravenous contrast.

[Series 5: T1 · sagittal · 4.0mm · 0.75mm/px · 2 of 31 slices shown (1 of 2)]
[im 1/31]
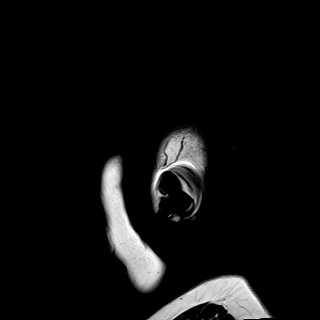
[im 31/31]
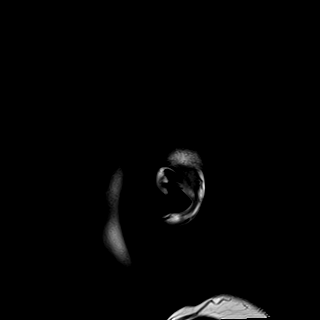

[Series 6: DWI · axial · 3.0mm · 1.44mm/px · z∈[-32,+102]mm · 7 of 86 slices shown (1 of 4)]
[im 1/86]
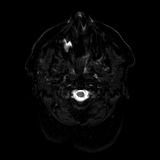
[im 15/86]
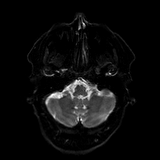
[im 29/86]
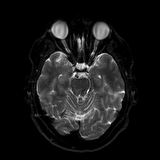
[im 43/86]
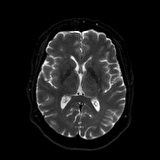
[im 57/86]
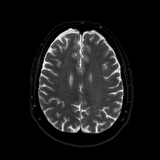
[im 71/86]
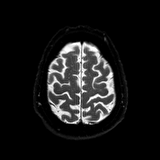
[im 86/86]
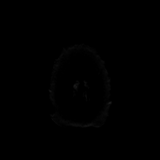

[Series 7: DWI · axial · 3.0mm · 1.44mm/px · z∈[-32,+102]mm · 4 of 43 slices shown (2 of 4)]
[im 1/43]
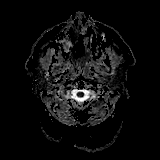
[im 15/43]
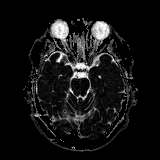
[im 29/43]
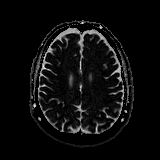
[im 43/43]
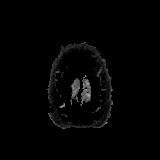

[Series 8: DWI · coronal · 5.0mm · 1.44mm/px · 5 of 60 slices shown (3 of 4)]
[im 1/60]
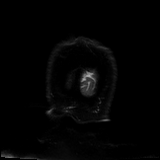
[im 15/60]
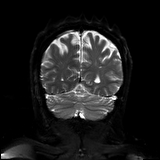
[im 30/60]
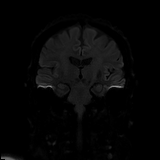
[im 45/60]
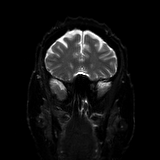
[im 60/60]
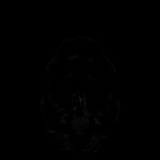

[Series 9: DWI · coronal · 5.0mm · 1.44mm/px · 3 of 30 slices shown (4 of 4)]
[im 1/30]
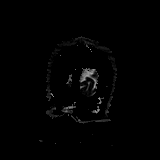
[im 15/30]
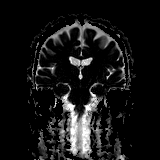
[im 30/30]
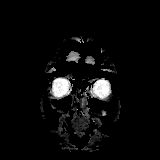

[Series 10: T2 · axial · 4.0mm · 0.36mm/px · z∈[-27,+104]mm · 2 of 27 slices shown (1 of 2)]
[im 1/27]
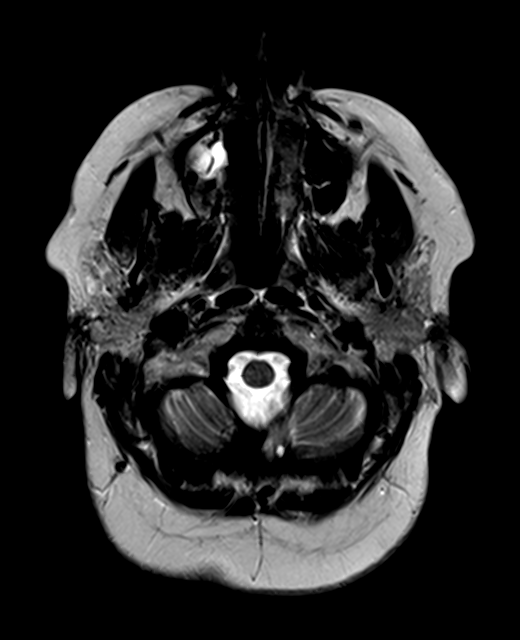
[im 27/27]
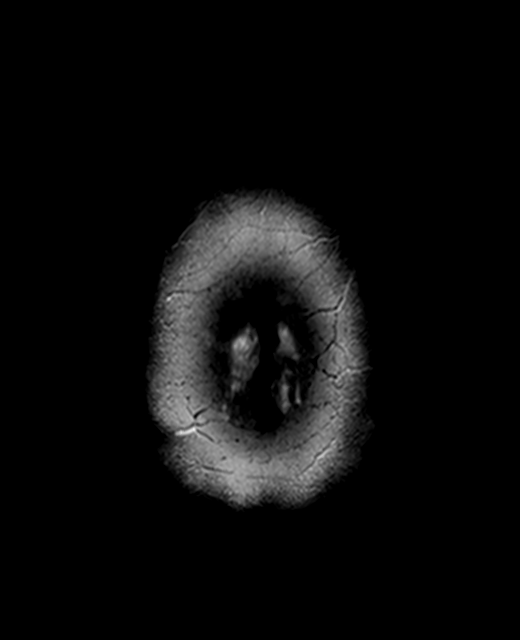

[Series 11: FLAIR · axial · 3.0mm · 0.72mm/px · z∈[-36,+110]mm · 2 of 26 slices shown]
[im 1/26]
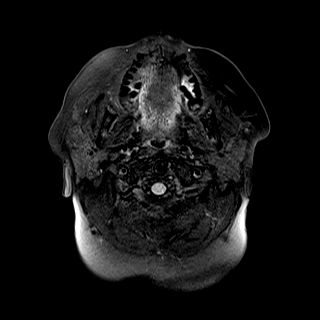
[im 26/26]
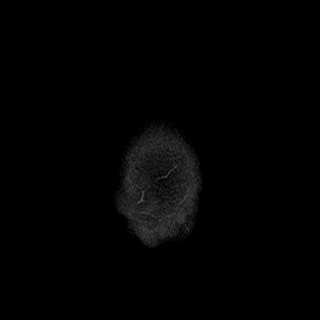

[Series 13: swi_images · axial · 1.5mm · 0.90mm/px · z∈[-30,+108]mm · 8 of 96 slices shown]
[im 1/96]
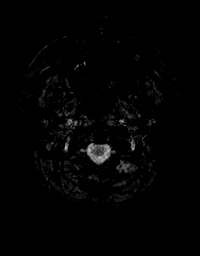
[im 14/96]
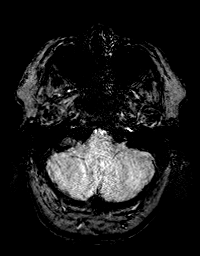
[im 28/96]
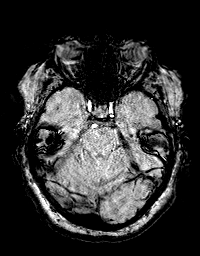
[im 41/96]
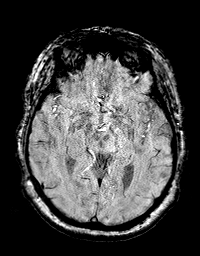
[im 55/96]
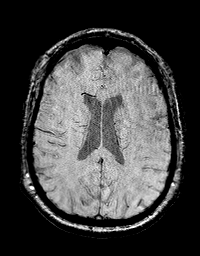
[im 68/96]
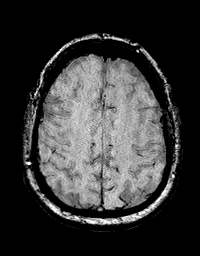
[im 82/96]
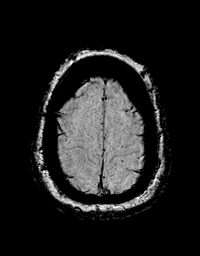
[im 96/96]
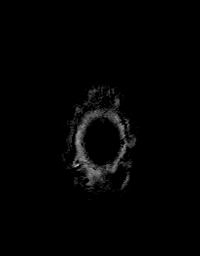

[Series 14: T1 · axial · 1.0mm · 0.90mm/px · z∈[-29,+110]mm · 12 of 144 slices shown (2 of 2)]
[im 1/144]
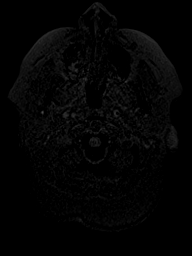
[im 14/144]
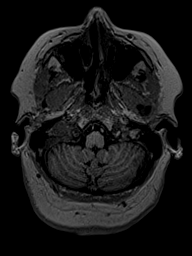
[im 27/144]
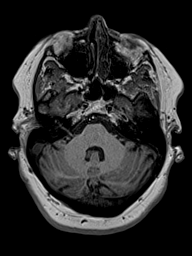
[im 40/144]
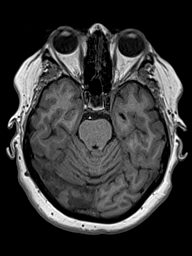
[im 53/144]
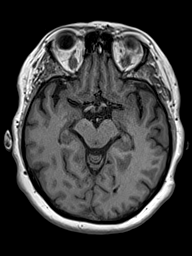
[im 66/144]
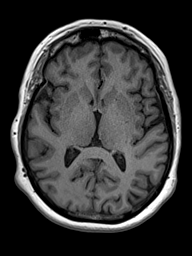
[im 79/144]
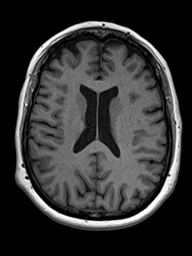
[im 92/144]
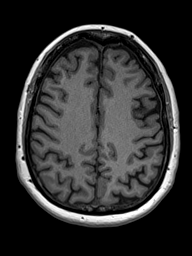
[im 105/144]
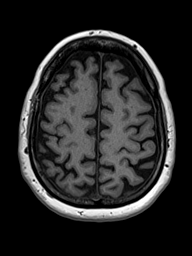
[im 118/144]
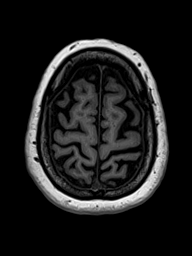
[im 131/144]
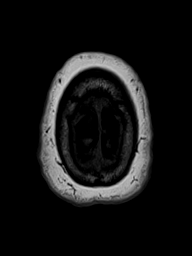
[im 144/144]
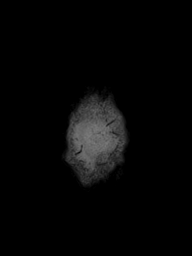

[Series 15: T2 · coronal · 4.0mm · 0.36mm/px · 3 of 30 slices shown (2 of 2)]
[im 1/30]
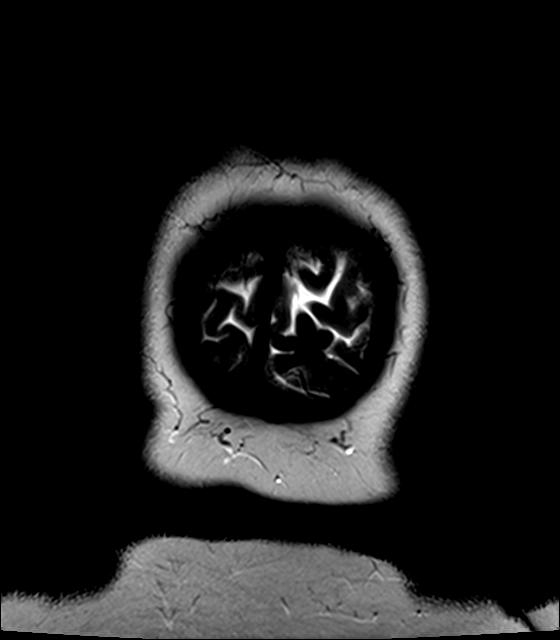
[im 15/30]
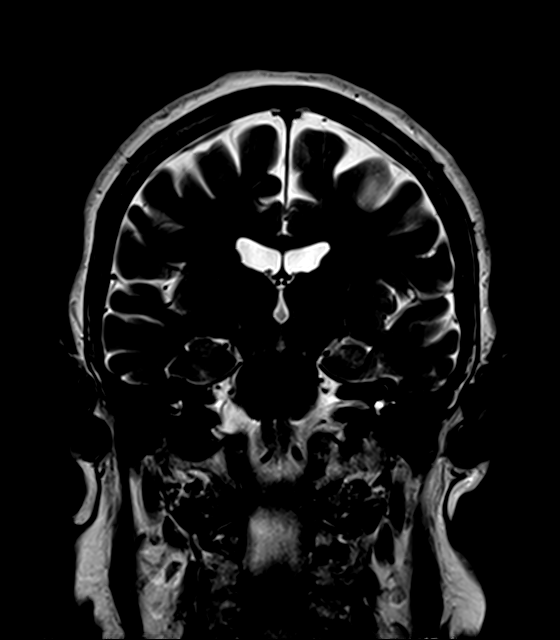
[im 30/30]
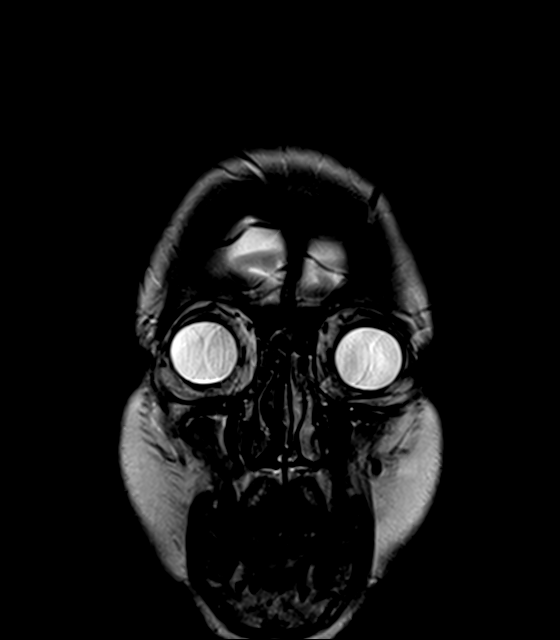

[48 of 48 positions shown; findings below may reference images not displayed]

FINDINGS: Brain: No acute or remote infarction, hemorrhage, hydrocephalus,
extra-axial collection or mass lesion. Tiny T2 hyperintensity right
lateral of the lower third ventricle has no surrounding FLAIR signal
abnormality and is best attributed to dilated perivascular space. No
white matter disease. No atrophy.

Vascular: Normal flow voids.

Skull and upper cervical spine: Spurred appearance of the left
mandibular condyle with joint effusion and medial inferiorly
projected septated cyst measuring up to 7 mm.

Sinuses/Orbits: Mild mucosal thickening in the floor of the right
maxillary sinus. There is also a right maxillary secretions. No
fluid levels are noted.
IMPRESSION: 1. Normal appearance of the brain.  No explanation for symptoms.
2. Left TMJ arthropathy with ganglion.

## 2018-02-10 IMAGING — US US CAROTID DUPLEX BILAT
1 series · 13 of 24 positions shown · non-contrast
Comparison: None.

CLINICAL DATA: TIA. Right eye drooping. Slurred speech for 6 hours.

EXAM:
BILATERAL CAROTID DUPLEX ULTRASOUND
TECHNIQUE: Gray scale imaging, color Doppler and duplex ultrasound were
performed of bilateral carotid and vertebral arteries in the neck.

[Series 1: us carotid duplex bilat · 0.06mm/px · 13 of 70 slices shown]
[im 1/70]
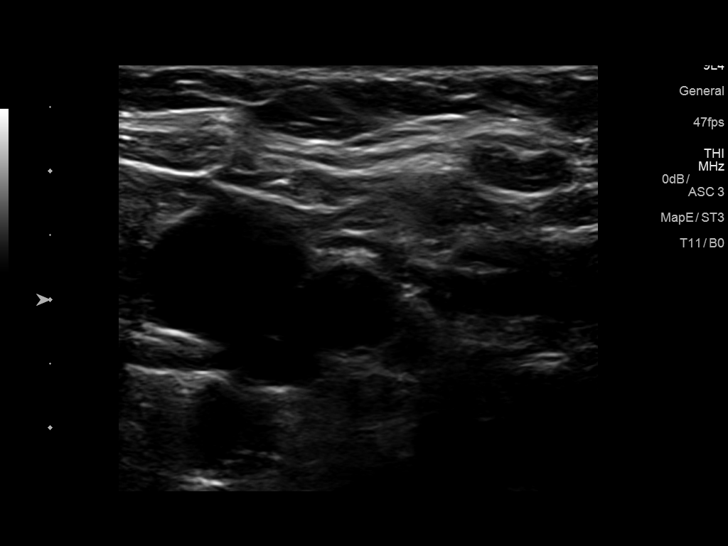
[im 7/70]
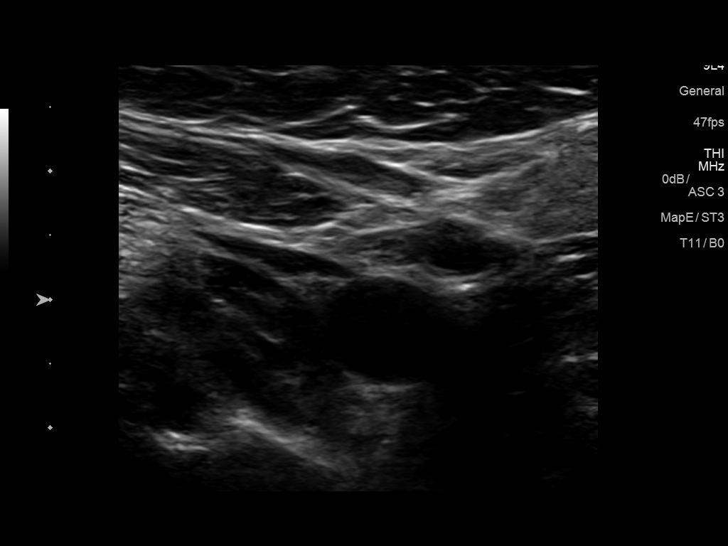
[im 13/70]
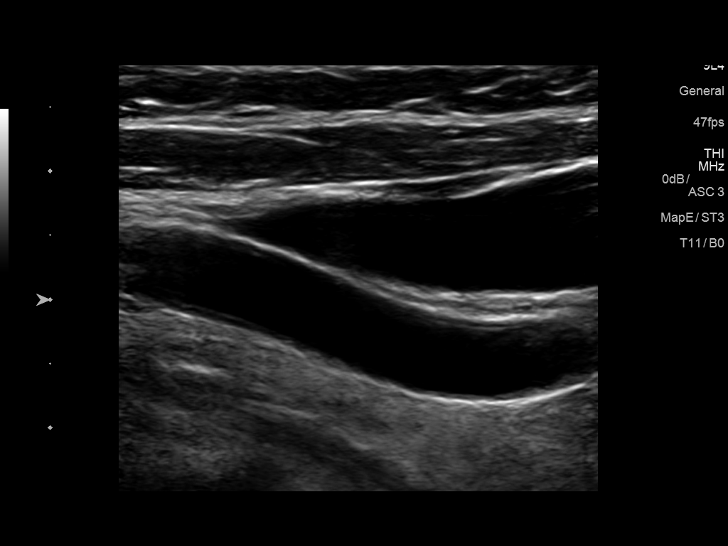
[im 19/70]
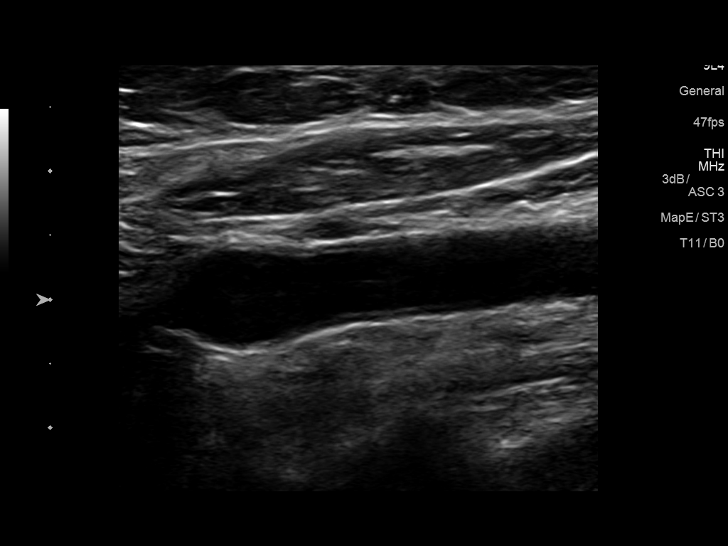
[im 25/70]
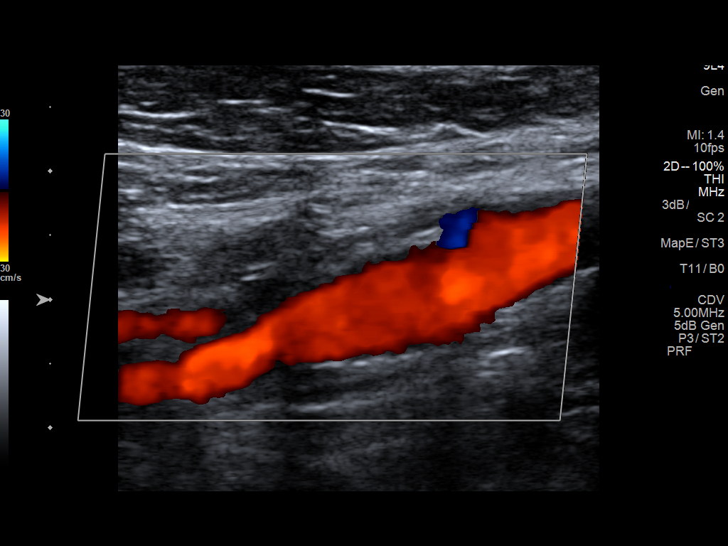
[im 31/70]
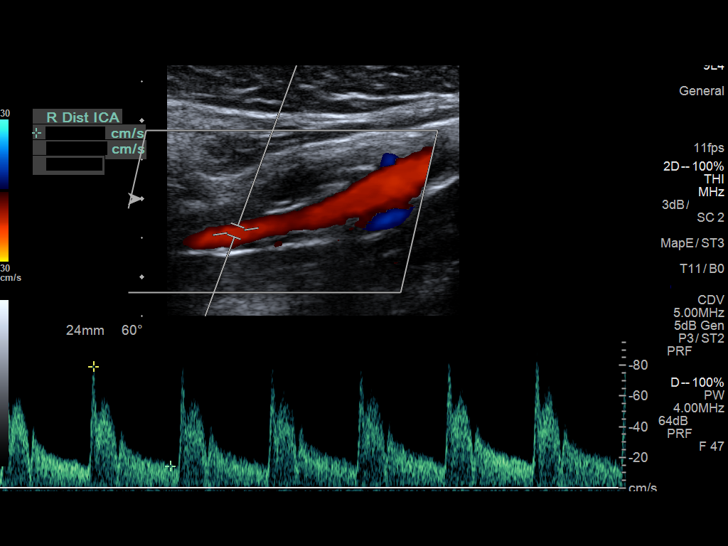
[im 37/70]
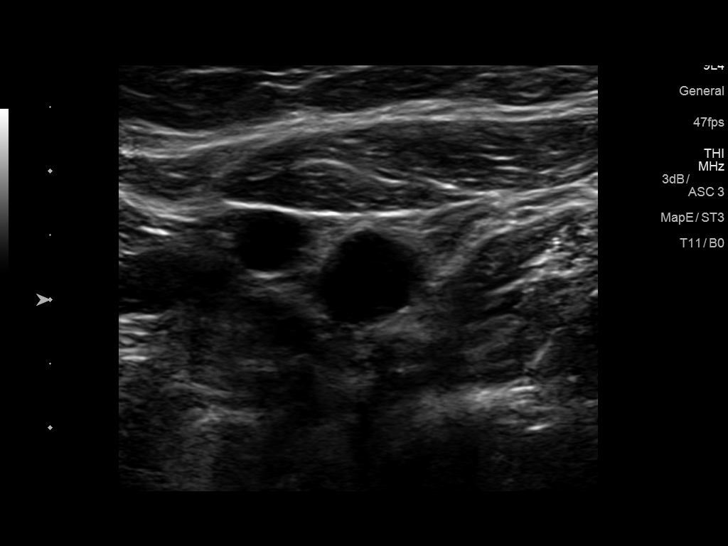
[im 40/70]
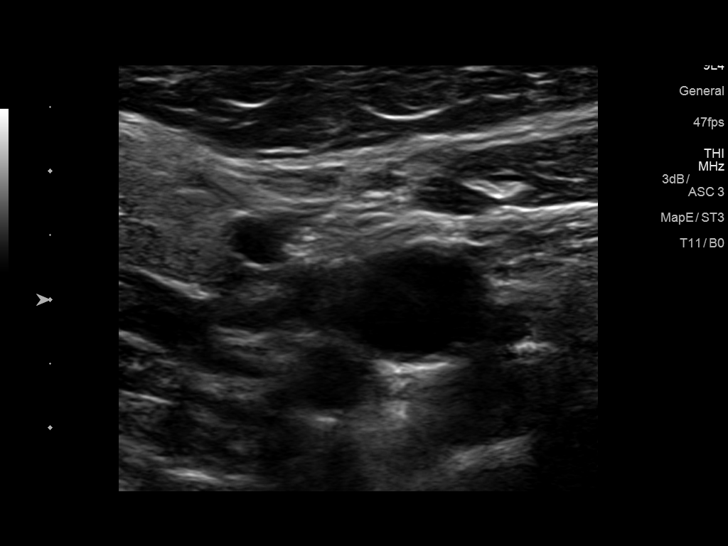
[im 46/70]
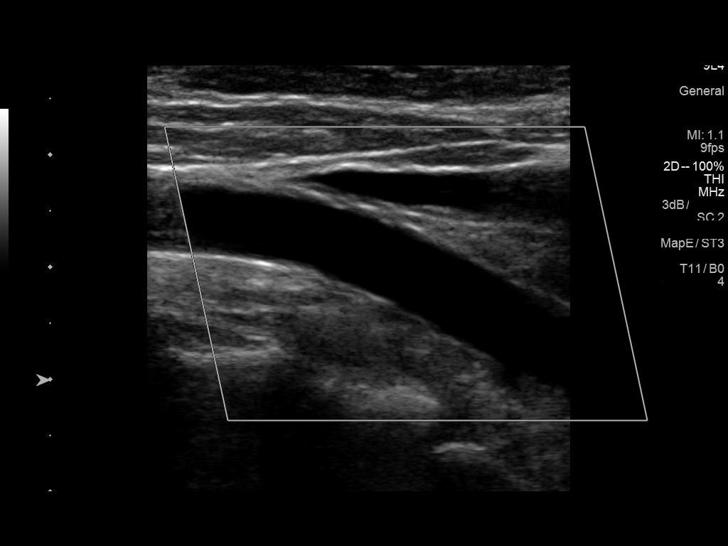
[im 52/70]
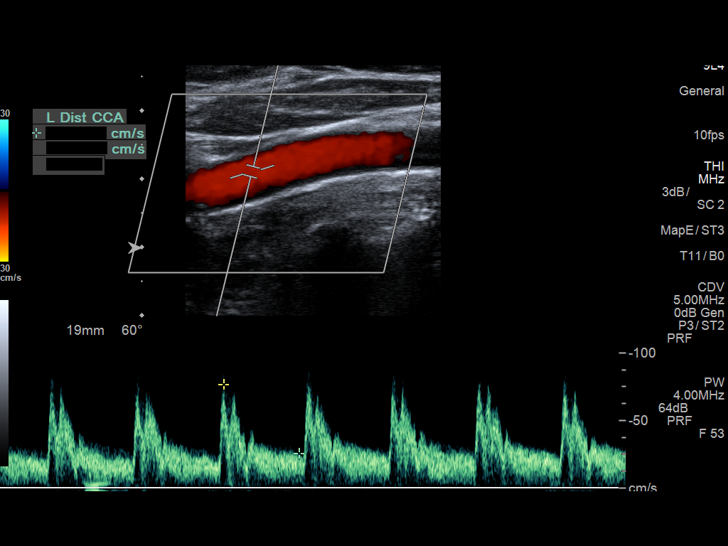
[im 58/70]
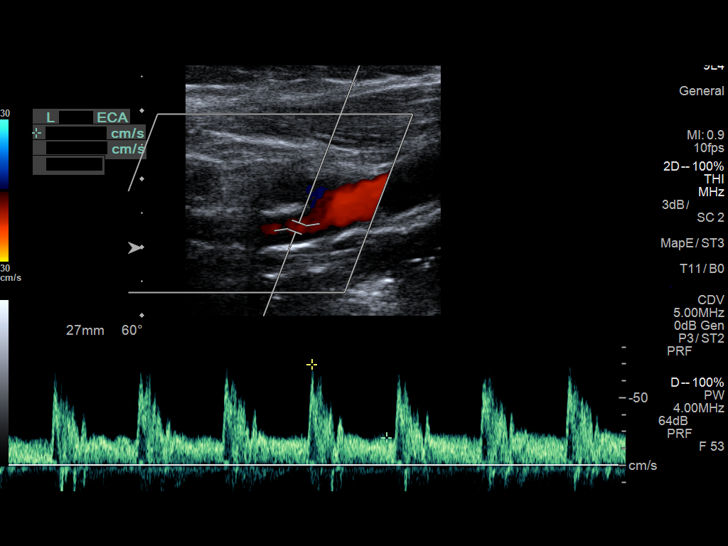
[im 64/70]
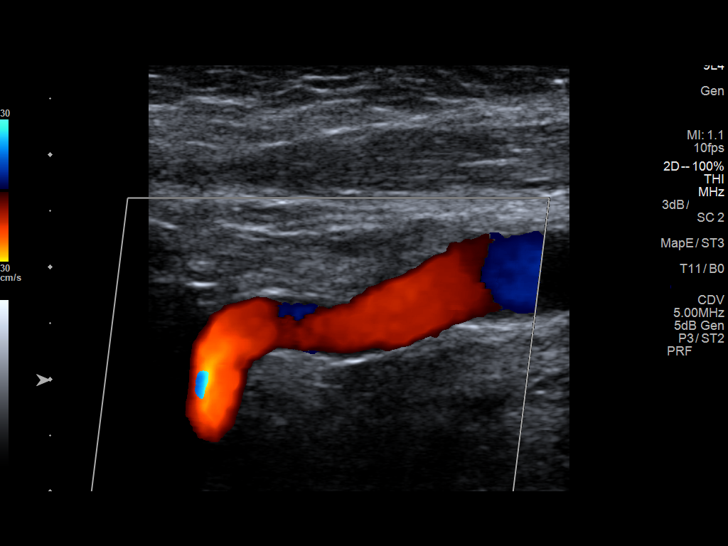
[im 70/70]
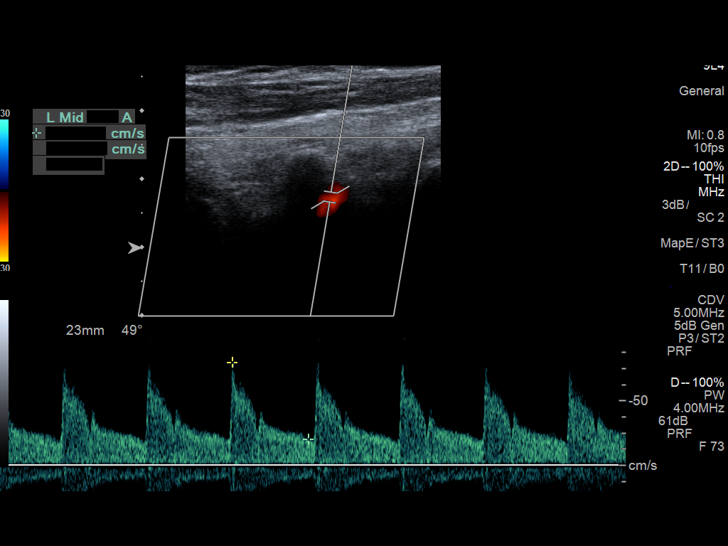

[13 of 24 positions shown; findings below may reference images not displayed]

FINDINGS: Criteria: Quantification of carotid stenosis is based on velocity
parameters that correlate the residual internal carotid diameter
with NASCET-based stenosis levels, using the diameter of the distal
internal carotid lumen as the denominator for stenosis measurement.

The following velocity measurements were obtained:

RIGHT

ICA:  90 cm/sec

CCA:  123 cm/sec

SYSTOLIC ICA/CCA RATIO:

DIASTOLIC ICA/CCA RATIO:

ECA:  88 cm/sec

LEFT

ICA:  73 cm/sec

CCA:  119 cm/sec

SYSTOLIC ICA/CCA RATIO:

DIASTOLIC ICA/CCA RATIO:

ECA:  75 cm/sec

RIGHT CAROTID ARTERY: Little if any plaque in the bulb. Low
resistance internal carotid Doppler pattern.

RIGHT VERTEBRAL ARTERY:  Antegrade.

LEFT CAROTID ARTERY: Little if any plaque in the bulb. Low
resistance internal carotid Doppler pattern.

LEFT VERTEBRAL ARTERY:  Antegrade.
IMPRESSION: Less than 50% stenosis in the right and left internal carotid
artery's.

## 2018-02-13 DIAGNOSIS — R5383 Other fatigue: Secondary | ICD-10-CM | POA: Diagnosis not present

## 2018-02-13 DIAGNOSIS — Z779 Other contact with and (suspected) exposures hazardous to health: Secondary | ICD-10-CM | POA: Diagnosis not present

## 2018-02-13 DIAGNOSIS — D509 Iron deficiency anemia, unspecified: Secondary | ICD-10-CM | POA: Diagnosis not present

## 2018-02-13 DIAGNOSIS — R87610 Atypical squamous cells of undetermined significance on cytologic smear of cervix (ASC-US): Secondary | ICD-10-CM | POA: Diagnosis not present

## 2018-04-04 DIAGNOSIS — N9089 Other specified noninflammatory disorders of vulva and perineum: Secondary | ICD-10-CM | POA: Diagnosis not present

## 2018-04-04 DIAGNOSIS — Z1231 Encounter for screening mammogram for malignant neoplasm of breast: Secondary | ICD-10-CM | POA: Diagnosis not present

## 2018-04-04 DIAGNOSIS — Z803 Family history of malignant neoplasm of breast: Secondary | ICD-10-CM | POA: Diagnosis not present

## 2018-04-12 DIAGNOSIS — M797 Fibromyalgia: Secondary | ICD-10-CM | POA: Diagnosis not present

## 2018-04-12 DIAGNOSIS — I10 Essential (primary) hypertension: Secondary | ICD-10-CM | POA: Diagnosis not present

## 2018-04-12 DIAGNOSIS — E7800 Pure hypercholesterolemia, unspecified: Secondary | ICD-10-CM | POA: Diagnosis not present

## 2018-04-12 DIAGNOSIS — G4733 Obstructive sleep apnea (adult) (pediatric): Secondary | ICD-10-CM | POA: Diagnosis not present

## 2018-04-12 DIAGNOSIS — G259 Extrapyramidal and movement disorder, unspecified: Secondary | ICD-10-CM | POA: Diagnosis not present

## 2018-04-12 DIAGNOSIS — K219 Gastro-esophageal reflux disease without esophagitis: Secondary | ICD-10-CM | POA: Diagnosis not present

## 2018-04-12 DIAGNOSIS — J309 Allergic rhinitis, unspecified: Secondary | ICD-10-CM | POA: Diagnosis not present

## 2018-04-12 DIAGNOSIS — F419 Anxiety disorder, unspecified: Secondary | ICD-10-CM | POA: Diagnosis not present

## 2018-04-25 DIAGNOSIS — H16141 Punctate keratitis, right eye: Secondary | ICD-10-CM | POA: Diagnosis not present

## 2018-06-08 DIAGNOSIS — H43811 Vitreous degeneration, right eye: Secondary | ICD-10-CM | POA: Diagnosis not present

## 2018-06-08 DIAGNOSIS — H25043 Posterior subcapsular polar age-related cataract, bilateral: Secondary | ICD-10-CM | POA: Diagnosis not present

## 2018-06-09 IMAGING — CR DG CHEST 2V
2 series · 2 of 2 positions shown · non-contrast
Comparison: Chest x-ray of June 18, 2009

CLINICAL DATA: Six months of persistent cough. History of allergic
rhinitis, essential hypertension, never smoked.

EXAM:
CHEST  2 VIEW

[w chest pa]
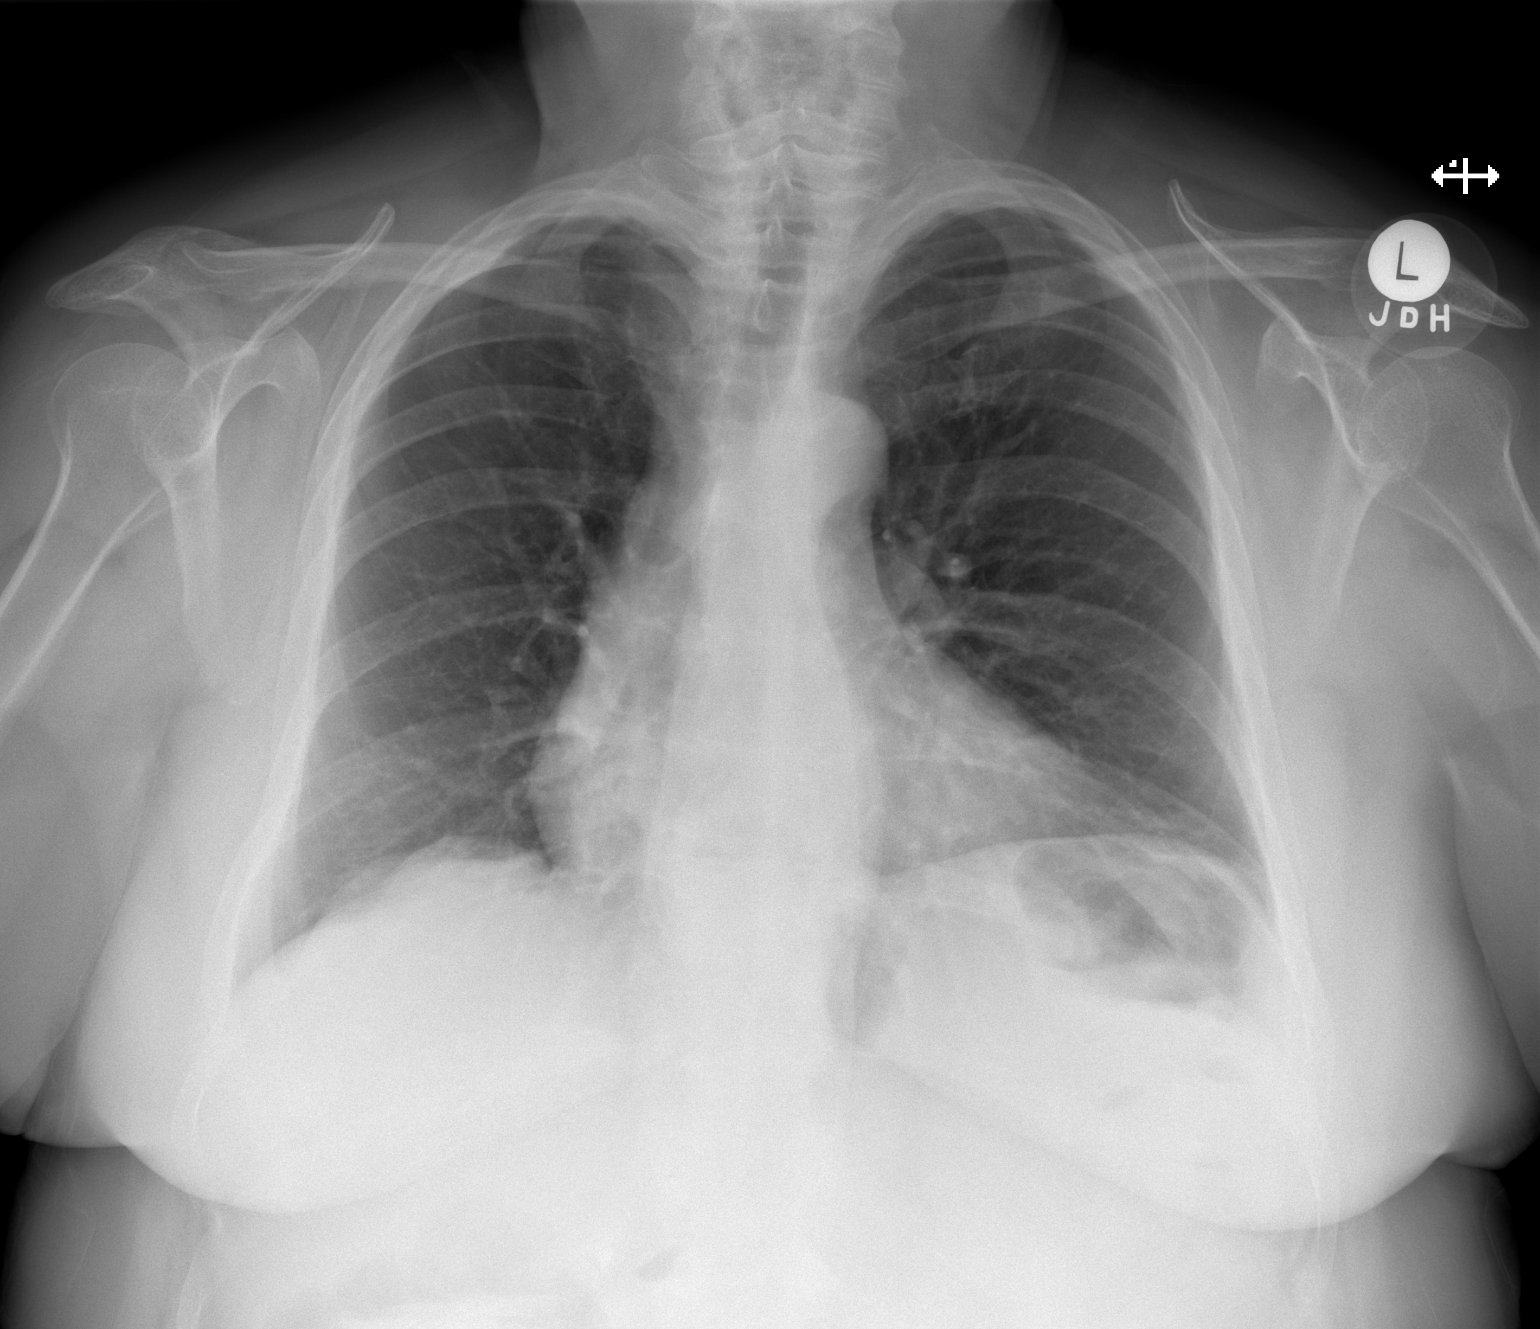

[w chest lat]
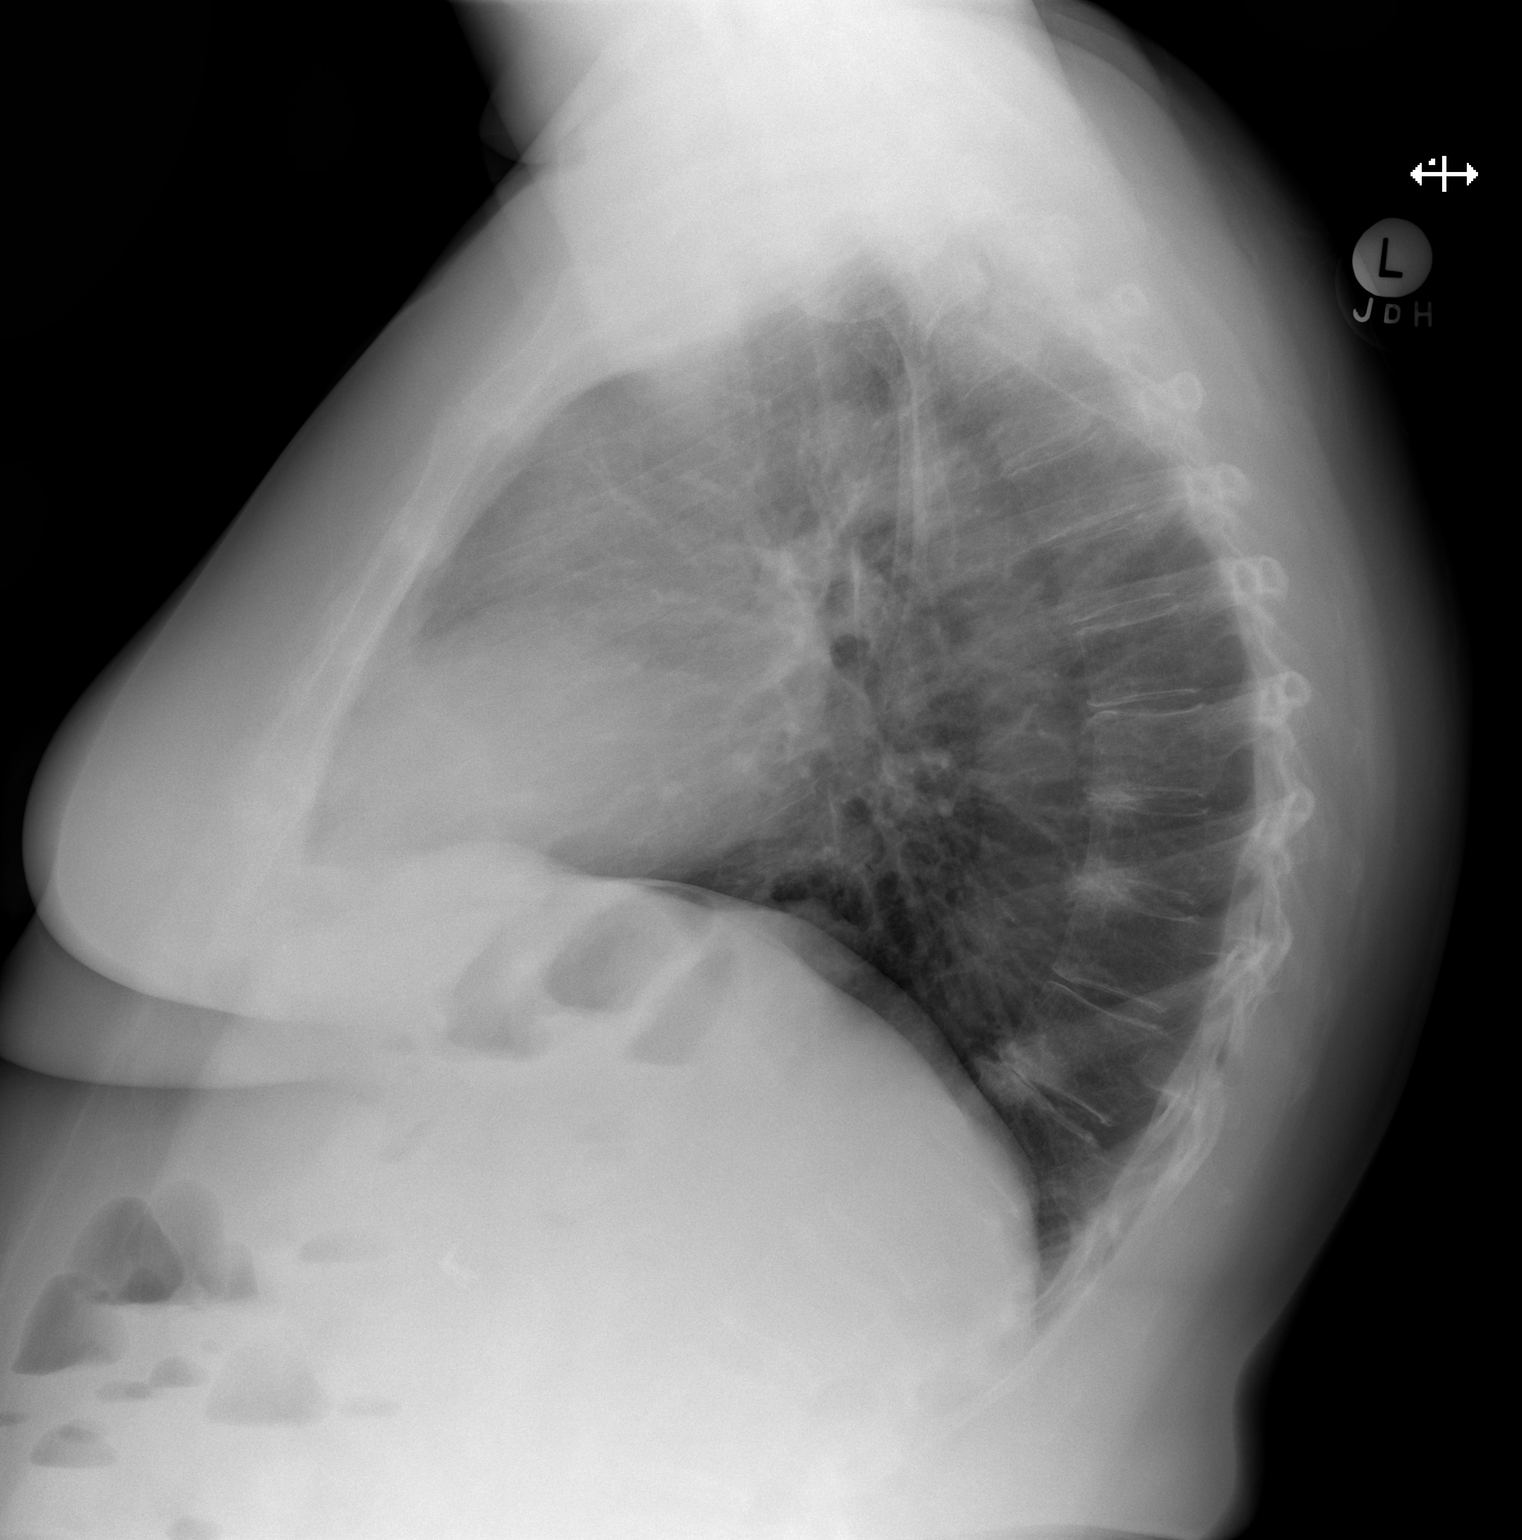

[2 of 2 positions shown; findings below may reference images not displayed]

FINDINGS: The lungs are adequately inflated and clear. The heart is top-normal
in size but stable. There is a moderate-sized hiatal hernia. There
is tortuosity of the descending thoracic aorta. The trachea is
midline. There is mild multilevel degenerative disc disease of the
lower thoracic spine.
IMPRESSION: There is no pneumonia nor CHF. Moderate-sized hiatal hernia. This
may place the patient at increased risk for aspiration.

## 2018-08-30 DIAGNOSIS — Z91013 Allergy to seafood: Secondary | ICD-10-CM | POA: Diagnosis not present

## 2018-08-30 DIAGNOSIS — T781XXD Other adverse food reactions, not elsewhere classified, subsequent encounter: Secondary | ICD-10-CM | POA: Diagnosis not present

## 2018-08-30 DIAGNOSIS — J310 Chronic rhinitis: Secondary | ICD-10-CM | POA: Diagnosis not present

## 2018-08-30 DIAGNOSIS — R21 Rash and other nonspecific skin eruption: Secondary | ICD-10-CM | POA: Diagnosis not present

## 2018-09-04 DIAGNOSIS — Z6837 Body mass index (BMI) 37.0-37.9, adult: Secondary | ICD-10-CM | POA: Diagnosis not present

## 2018-09-04 DIAGNOSIS — D649 Anemia, unspecified: Secondary | ICD-10-CM | POA: Diagnosis not present

## 2018-09-04 DIAGNOSIS — R5383 Other fatigue: Secondary | ICD-10-CM | POA: Diagnosis not present

## 2018-09-04 DIAGNOSIS — Z779 Other contact with and (suspected) exposures hazardous to health: Secondary | ICD-10-CM | POA: Diagnosis not present

## 2018-09-04 DIAGNOSIS — Z124 Encounter for screening for malignant neoplasm of cervix: Secondary | ICD-10-CM | POA: Diagnosis not present

## 2018-10-18 DIAGNOSIS — G4733 Obstructive sleep apnea (adult) (pediatric): Secondary | ICD-10-CM | POA: Diagnosis not present

## 2018-11-16 DIAGNOSIS — H524 Presbyopia: Secondary | ICD-10-CM | POA: Diagnosis not present

## 2018-11-16 DIAGNOSIS — H5213 Myopia, bilateral: Secondary | ICD-10-CM | POA: Diagnosis not present

## 2018-11-16 DIAGNOSIS — H2513 Age-related nuclear cataract, bilateral: Secondary | ICD-10-CM | POA: Diagnosis not present

## 2018-11-16 DIAGNOSIS — H52223 Regular astigmatism, bilateral: Secondary | ICD-10-CM | POA: Diagnosis not present

## 2018-11-22 DIAGNOSIS — I10 Essential (primary) hypertension: Secondary | ICD-10-CM | POA: Diagnosis not present

## 2018-11-22 DIAGNOSIS — G459 Transient cerebral ischemic attack, unspecified: Secondary | ICD-10-CM | POA: Diagnosis not present

## 2018-11-22 DIAGNOSIS — M179 Osteoarthritis of knee, unspecified: Secondary | ICD-10-CM | POA: Diagnosis not present

## 2018-11-22 DIAGNOSIS — E7800 Pure hypercholesterolemia, unspecified: Secondary | ICD-10-CM | POA: Diagnosis not present

## 2018-12-06 DIAGNOSIS — Z0000 Encounter for general adult medical examination without abnormal findings: Secondary | ICD-10-CM | POA: Diagnosis not present

## 2018-12-06 DIAGNOSIS — M26629 Arthralgia of temporomandibular joint, unspecified side: Secondary | ICD-10-CM | POA: Diagnosis not present

## 2018-12-06 DIAGNOSIS — F419 Anxiety disorder, unspecified: Secondary | ICD-10-CM | POA: Diagnosis not present

## 2018-12-06 DIAGNOSIS — I10 Essential (primary) hypertension: Secondary | ICD-10-CM | POA: Diagnosis not present

## 2018-12-06 DIAGNOSIS — M797 Fibromyalgia: Secondary | ICD-10-CM | POA: Diagnosis not present

## 2018-12-06 DIAGNOSIS — G4733 Obstructive sleep apnea (adult) (pediatric): Secondary | ICD-10-CM | POA: Diagnosis not present

## 2018-12-06 DIAGNOSIS — H02401 Unspecified ptosis of right eyelid: Secondary | ICD-10-CM | POA: Diagnosis not present

## 2018-12-06 DIAGNOSIS — K589 Irritable bowel syndrome without diarrhea: Secondary | ICD-10-CM | POA: Diagnosis not present

## 2018-12-06 DIAGNOSIS — G259 Extrapyramidal and movement disorder, unspecified: Secondary | ICD-10-CM | POA: Diagnosis not present

## 2018-12-06 DIAGNOSIS — F5101 Primary insomnia: Secondary | ICD-10-CM | POA: Diagnosis not present

## 2018-12-06 DIAGNOSIS — E7800 Pure hypercholesterolemia, unspecified: Secondary | ICD-10-CM | POA: Diagnosis not present

## 2018-12-06 DIAGNOSIS — K219 Gastro-esophageal reflux disease without esophagitis: Secondary | ICD-10-CM | POA: Diagnosis not present

## 2019-01-04 DIAGNOSIS — E7800 Pure hypercholesterolemia, unspecified: Secondary | ICD-10-CM | POA: Diagnosis not present

## 2019-01-04 DIAGNOSIS — R682 Dry mouth, unspecified: Secondary | ICD-10-CM | POA: Diagnosis not present

## 2019-01-04 DIAGNOSIS — I10 Essential (primary) hypertension: Secondary | ICD-10-CM | POA: Diagnosis not present

## 2019-01-04 DIAGNOSIS — H02401 Unspecified ptosis of right eyelid: Secondary | ICD-10-CM | POA: Diagnosis not present

## 2019-05-23 DIAGNOSIS — Z23 Encounter for immunization: Secondary | ICD-10-CM | POA: Diagnosis not present

## 2019-06-14 DIAGNOSIS — F5101 Primary insomnia: Secondary | ICD-10-CM | POA: Diagnosis not present

## 2019-06-14 DIAGNOSIS — K589 Irritable bowel syndrome without diarrhea: Secondary | ICD-10-CM | POA: Diagnosis not present

## 2019-06-14 DIAGNOSIS — J309 Allergic rhinitis, unspecified: Secondary | ICD-10-CM | POA: Diagnosis not present

## 2019-06-14 DIAGNOSIS — I10 Essential (primary) hypertension: Secondary | ICD-10-CM | POA: Diagnosis not present

## 2019-06-14 DIAGNOSIS — E7800 Pure hypercholesterolemia, unspecified: Secondary | ICD-10-CM | POA: Diagnosis not present

## 2019-06-14 DIAGNOSIS — G259 Extrapyramidal and movement disorder, unspecified: Secondary | ICD-10-CM | POA: Diagnosis not present

## 2019-06-14 DIAGNOSIS — G4733 Obstructive sleep apnea (adult) (pediatric): Secondary | ICD-10-CM | POA: Diagnosis not present

## 2019-06-14 DIAGNOSIS — M797 Fibromyalgia: Secondary | ICD-10-CM | POA: Diagnosis not present

## 2019-06-14 DIAGNOSIS — F419 Anxiety disorder, unspecified: Secondary | ICD-10-CM | POA: Diagnosis not present

## 2019-06-14 DIAGNOSIS — K219 Gastro-esophageal reflux disease without esophagitis: Secondary | ICD-10-CM | POA: Diagnosis not present

## 2019-06-20 DIAGNOSIS — Z23 Encounter for immunization: Secondary | ICD-10-CM | POA: Diagnosis not present

## 2019-09-09 DIAGNOSIS — Z124 Encounter for screening for malignant neoplasm of cervix: Secondary | ICD-10-CM | POA: Diagnosis not present

## 2019-09-09 DIAGNOSIS — Z6831 Body mass index (BMI) 31.0-31.9, adult: Secondary | ICD-10-CM | POA: Diagnosis not present

## 2019-10-17 DIAGNOSIS — M179 Osteoarthritis of knee, unspecified: Secondary | ICD-10-CM | POA: Diagnosis not present

## 2019-10-17 DIAGNOSIS — E7800 Pure hypercholesterolemia, unspecified: Secondary | ICD-10-CM | POA: Diagnosis not present

## 2019-10-17 DIAGNOSIS — I10 Essential (primary) hypertension: Secondary | ICD-10-CM | POA: Diagnosis not present

## 2019-10-17 DIAGNOSIS — G459 Transient cerebral ischemic attack, unspecified: Secondary | ICD-10-CM | POA: Diagnosis not present

## 2019-10-24 DIAGNOSIS — H2513 Age-related nuclear cataract, bilateral: Secondary | ICD-10-CM | POA: Diagnosis not present

## 2019-10-24 DIAGNOSIS — H43812 Vitreous degeneration, left eye: Secondary | ICD-10-CM | POA: Diagnosis not present

## 2019-11-21 DIAGNOSIS — G4733 Obstructive sleep apnea (adult) (pediatric): Secondary | ICD-10-CM | POA: Diagnosis not present

## 2019-11-25 DIAGNOSIS — H2513 Age-related nuclear cataract, bilateral: Secondary | ICD-10-CM | POA: Diagnosis not present

## 2019-11-25 DIAGNOSIS — H43812 Vitreous degeneration, left eye: Secondary | ICD-10-CM | POA: Diagnosis not present

## 2019-12-17 DIAGNOSIS — F5101 Primary insomnia: Secondary | ICD-10-CM | POA: Diagnosis not present

## 2019-12-17 DIAGNOSIS — K219 Gastro-esophageal reflux disease without esophagitis: Secondary | ICD-10-CM | POA: Diagnosis not present

## 2019-12-17 DIAGNOSIS — D508 Other iron deficiency anemias: Secondary | ICD-10-CM | POA: Diagnosis not present

## 2019-12-17 DIAGNOSIS — M797 Fibromyalgia: Secondary | ICD-10-CM | POA: Diagnosis not present

## 2019-12-17 DIAGNOSIS — E7800 Pure hypercholesterolemia, unspecified: Secondary | ICD-10-CM | POA: Diagnosis not present

## 2019-12-17 DIAGNOSIS — H029 Unspecified disorder of eyelid: Secondary | ICD-10-CM | POA: Diagnosis not present

## 2019-12-17 DIAGNOSIS — K589 Irritable bowel syndrome without diarrhea: Secondary | ICD-10-CM | POA: Diagnosis not present

## 2019-12-17 DIAGNOSIS — Z23 Encounter for immunization: Secondary | ICD-10-CM | POA: Diagnosis not present

## 2019-12-17 DIAGNOSIS — F419 Anxiety disorder, unspecified: Secondary | ICD-10-CM | POA: Diagnosis not present

## 2019-12-17 DIAGNOSIS — I10 Essential (primary) hypertension: Secondary | ICD-10-CM | POA: Diagnosis not present

## 2019-12-17 DIAGNOSIS — G4733 Obstructive sleep apnea (adult) (pediatric): Secondary | ICD-10-CM | POA: Diagnosis not present

## 2019-12-17 DIAGNOSIS — G259 Extrapyramidal and movement disorder, unspecified: Secondary | ICD-10-CM | POA: Diagnosis not present

## 2020-02-18 ENCOUNTER — Encounter: Payer: Self-pay | Admitting: *Deleted

## 2020-02-25 ENCOUNTER — Ambulatory Visit (INDEPENDENT_AMBULATORY_CARE_PROVIDER_SITE_OTHER): Payer: Medicare Other | Admitting: Diagnostic Neuroimaging

## 2020-02-25 ENCOUNTER — Encounter: Payer: Self-pay | Admitting: Diagnostic Neuroimaging

## 2020-02-25 VITALS — BP 124/62 | HR 68 | Ht 63.0 in | Wt 214.0 lb

## 2020-02-25 DIAGNOSIS — H02401 Unspecified ptosis of right eyelid: Secondary | ICD-10-CM

## 2020-02-25 DIAGNOSIS — R6889 Other general symptoms and signs: Secondary | ICD-10-CM | POA: Diagnosis not present

## 2020-02-25 DIAGNOSIS — Z79899 Other long term (current) drug therapy: Secondary | ICD-10-CM | POA: Diagnosis not present

## 2020-02-25 NOTE — Patient Instructions (Signed)
-   check labs; consider trial of mestinon

## 2020-02-25 NOTE — Progress Notes (Signed)
[facility]  PATIENT: Doris Reyes DOB: 01-Feb-1951  REFERRING CLINICIAN: [name], MD HISTORY FROM: Patient REASON FOR VISIT: New consult   HISTORICAL  CHIEF COMPLAINT:  Chief Complaint  Patient presents with  . Right eyelid weakness    rm 6 New Pt     HISTORY OF PRESENT ILLNESS:   69 year old female here for evaluation of intermittent right eyelid ptosis.  Symptom started in 2018.  Symptoms occur randomly without specific triggering aggravating factors.  Patient has right eyelid ptosis lasting for few minutes at a time and then resolves.  No double vision.  No slurred speech.  No breathing problems or generalized weakness.   REVIEW OF SYSTEMS: Full 14 system review of systems performed and negative with exception of: As per HPI.  ALLERGIES: Allergies  Allergen Reactions  . Ceftin [Cefuroxime Axetil] Diarrhea  . Pneumococcal Vaccines Other (See Comments)    Rash, tender, swelling in the injection site  . Tramadol Hcl Other (See Comments)    HOME MEDICATIONS: Outpatient Medications Prior to Visit  Medication Sig Dispense Refill  . acetaminophen (TYLENOL) 500 MG tablet Take 500 mg by mouth every 6 (six) hours as needed for moderate pain or headache.     . ALPRAZolam (XANAX) 0.25 MG tablet Take 0.125-0.25 mg by mouth at bedtime as needed for anxiety.     Marland Kitchen amLODipine (NORVASC) 5 MG tablet Take 5 mg by mouth daily.    Marland Kitchen aspirin EC 81 MG tablet Take 81 mg by mouth daily. Swallow whole.    Marland Kitchen atenolol (TENORMIN) 50 MG tablet Take 75 mg by mouth daily.     . benazepril (LOTENSIN) 10 MG tablet Take 10 mg by mouth daily.    . benzonatate (TESSALON) 100 MG capsule Take 100 mg by mouth at bedtime.     . Calcium Citrate-Vitamin D (CITRACAL + D PO) Take 1 tablet by mouth 2 (two) times daily.    . cetirizine (ZYRTEC ALLERGY) 10 MG tablet Take 10 mg by mouth daily as needed for allergies.     . Cholecalciferol (VITAMIN D) 1000 UNITS capsule Take 2,000  Units by mouth daily.     . diclofenac sodium (VOLTAREN) 1 % GEL Apply 1 application topically daily as needed (for pain). Apply as directed    . dicyclomine (BENTYL) 10 MG capsule Take 10 mg by mouth 4 (four) times daily as needed for spasms.     Marland Kitchen DIPHENOXYLATE-ATROPINE PO Take 4 mg by mouth as needed.    . DULoxetine (CYMBALTA) 60 MG capsule Take 120 mg by mouth daily.     Marland Kitchen EPINEPHrine (EPIPEN 2-PAK) 0.3 mg/0.3 mL IJ SOAJ injection Inject 0.3 mg into the muscle once.     . famotidine (PEPCID) 20 MG tablet Take 20 mg by mouth at bedtime.    . fexofenadine (ALLEGRA) 180 MG tablet Take 180 mg by mouth daily.    . fluticasone (FLONASE) 50 MCG/ACT nasal spray Place 1 spray into both nostrils daily.    . nitroGLYCERIN (NITROSTAT) 0.4 MG SL tablet Place 0.4 mg under the tongue every 5 (five) minutes as needed for chest pain.     . Omega-3 Fatty Acids (FISH OIL) 1000 MG CAPS Take 1,000 mg by mouth daily.     . ondansetron (ZOFRAN) 4 MG tablet Take 4 mg by mouth every 8 (eight) hours as needed for nausea or vomiting.    . pantoprazole (PROTONIX) 40 MG tablet Take 40 mg by mouth daily.     Marland Kitchen  Phenylephrine-APAP-Guaifenesin (TYLENOL SINUS SEVERE PO) Take 2 tablets by mouth daily as needed (for congestion).    . Probiotic Product (PROBIOTIC & ACIDOPHILUS EX ST PO) Take 1 capsule by mouth daily. Ultra Flora Plus DF Capsules    . rOPINIRole (REQUIP) 1 MG tablet Take 1 mg by mouth at bedtime.    . simvastatin (ZOCOR) 10 MG tablet Take 10 mg by mouth at bedtime.      . sucralfate (CARAFATE) 1 GM/10ML suspension Take 1 g by mouth 4 (four) times daily.     . traZODone (DESYREL) 50 MG tablet Take 50 mg by mouth at bedtime.    . vitamin C (ASCORBIC ACID) 500 MG tablet Take 500 mg by mouth daily.      . vitamin E 400 UNIT capsule Take 400 Units by mouth daily.      . Nutritional Supplements (JUICE PLUS FIBRE PO) Take 2 each by mouth 2 (two) times daily.  (Patient not taking: Reported on 02/25/2020)    .  Polyvinyl Alcohol (LIQUID TEARS OP) Place 1 drop into both eyes 2 (two) times daily. (Patient not taking: Reported on 02/25/2020)    . ranitidine (ZANTAC) 150 MG tablet Take 1 tablet (150 mg total) by mouth at bedtime. (Patient not taking: Reported on 02/25/2020) 30 tablet 3   No facility-administered medications prior to visit.    PAST MEDICAL HISTORY: Past Medical History:  Diagnosis Date  . Anxiety   . Complication of anesthesia    difficulty waking up  . Diverticulitis   . Fibromyalgia   . GERD (gastroesophageal reflux disease)   . History of colonic polyps   . Hyperlipidemia   . Hypertension   . IBS (irritable bowel syndrome)   . Ischemic colitis ([place]) 03/2012  . Osteoporosis   . RLS (restless legs syndrome)   . Sinusitis   . Sleep apnea    CPAP    PAST SURGICAL HISTORY: Past Surgical History:  Procedure Laterality Date  . CHOLECYSTECTOMY  2004  . COLONOSCOPY WITH PROPOFOL N/A 04/18/2017   Procedure: COLONOSCOPY WITH PROPOFOL;  Surgeon: [name], MD;  Location: [facility];  Service: Endoscopy;  Laterality: N/A;  . ESOPHAGOGASTRODUODENOSCOPY (EGD) WITH PROPOFOL N/A 04/18/2017   Procedure: ESOPHAGOGASTRODUODENOSCOPY (EGD) WITH PROPOFOL;  Surgeon: [name], MD;  Location: [facility];  Service: Endoscopy;  Laterality: N/A;  . FLEXIBLE SIGMOIDOSCOPY  04/20/2012   Procedure: FLEXIBLE SIGMOIDOSCOPY;  Surgeon: Beryle Beams, MD;  Location: [facility];  Service: Endoscopy;  Laterality: N/A;  . NISSEN FUNDOPLICATION  0102  . PARAESOPHAGEAL HERNIA REPAIR  09/11/2009   and Nissen fundoplication  . TONSILLECTOMY  1960's  . TOTAL ABDOMINAL HYSTERECTOMY  2001    FAMILY HISTORY: Family History  Problem Relation Age of Onset  . Heart attack Father   . Colon cancer Mother   . Alzheimer's disease Mother   . Cancer Maternal Aunt        x2  . Alzheimer's disease Maternal Aunt   . Kidney disease Maternal Uncle   . Atrial fibrillation Maternal Uncle   . Rheum arthritis  Other   . Stroke Paternal Uncle   . Aneurysm Paternal Grandmother        brain  . Stroke Paternal Grandfather   . Lung disease Neg Hx     SOCIAL HISTORY: Social History   Socioeconomic History  . Marital status: Married    Spouse name: Jeneen Rinks  . Number of children: 1  . Years of education: Not on file  . Highest  education level: Some college, no degree  Occupational History  . Occupation: disabled  Tobacco Use  . Smoking status: Never Smoker  . Smokeless tobacco: Never Used  . Tobacco comment: brief exposure through her husband  Vaping Use  . Vaping Use: Never used  Substance and Sexual Activity  . Alcohol use: Never    Alcohol/week: 0.0 standard drinks  . Drug use: Never  . Sexual activity: Not on file  Other Topics Concern  . Not on file  Social History Narrative   From [place] originally. Previously lived in [place]. Previously worked at [employer] also in a [employer]. Has also worked as a Network engineer for [employer]. No pets currently. No bird exposure. No known mold in her current home.    Lives with husband   Caffeine- coffee 2 c daily   Social Determinants of Health   Financial Resource Strain:   . Difficulty of Paying Living Expenses: Not on file  Food Insecurity:   . Worried About Charity fundraiser in the Last Year: Not on file  . Ran Out of Food in the Last Year: Not on file  Transportation Needs:   . Lack of Transportation (Medical): Not on file  . Lack of Transportation (Non-Medical): Not on file  Physical Activity:   . Days of Exercise per Week: Not on file  . Minutes of Exercise per Session: Not on file  Stress:   . Feeling of Stress : Not on file  Social Connections:   . Frequency of Communication with Friends and Family: Not on file  . Frequency of Social Gatherings with Friends and Family: Not on file  . Attends Religious Services: Not on file  . Active Member of Clubs or Organizations: Not on file  . Attends Archivist  Meetings: Not on file  . Marital Status: Not on file  Intimate Partner Violence:   . Fear of Current or Ex-Partner: Not on file  . Emotionally Abused: Not on file  . Physically Abused: Not on file  . Sexually Abused: Not on file     PHYSICAL EXAM  GENERAL EXAM/CONSTITUTIONAL: Vitals:  Vitals:   02/25/20 1451  BP: 124/62  Pulse: 68  Weight: 214 lb (97.1 kg)  Height: 5' 3"  (1.6 m)     Body mass index is 37.91 kg/m. Wt Readings from Last 3 Encounters:  02/25/20 214 lb (97.1 kg)  04/18/17 208 lb (94.3 kg)  12/11/14 208 lb 12.8 oz (94.7 kg)     Patient is in no distress; well developed, nourished and groomed; neck is supple  CARDIOVASCULAR:  Examination of carotid arteries is normal; no carotid bruits  Regular rate and rhythm, no murmurs  Examination of peripheral vascular system by observation and palpation is normal  EYES:  Ophthalmoscopic exam of optic discs and posterior segments is normal; no papilledema or hemorrhages  No exam data present  MUSCULOSKELETAL:  Gait, strength, tone, movements noted in Neurologic exam below  NEUROLOGIC: MENTAL STATUS:  No flowsheet data found.  awake, alert, oriented to person, place and time  recent and remote memory intact  normal attention and concentration  language fluent, comprehension intact, naming intact  fund of knowledge appropriate  CRANIAL NERVE:   2nd - no papilledema on fundoscopic exam  2nd, 3rd, 4th, 6th - pupils equal and reactive to light, visual fields full to confrontation, extraocular muscles intact, no nystagmus; no ptosis at rest or after 1 minute upgaze  5th - facial sensation symmetric  7th -  facial strength symmetric  8th - hearing intact  9th - palate elevates symmetrically, uvula midline  11th - shoulder shrug symmetric  12th - tongue protrusion midline  MOTOR:   normal bulk and tone, full strength in the BUE, BLE  SENSORY:   normal and symmetric to light touch,  temperature, vibration  COORDINATION:   finger-nose-finger, fine finger movements normal  REFLEXES:   deep tendon reflexes present and symmetric  GAIT/STATION:   narrow based gait     DIAGNOSTIC DATA (LABS, IMAGING, TESTING) - I reviewed patient records, labs, notes, testing and imaging myself where available.  Lab Results  Component Value Date   WBC 5.5 09/08/2009   HGB 14.2 09/08/2009   HCT 41.2 09/08/2009   MCV 88.8 09/08/2009   PLT 223 09/08/2009      Component Value Date/Time   NA 139 09/08/2009 0920   K 4.3 09/08/2009 0920   CL 106 09/08/2009 0920   CO2 27 09/08/2009 0920   GLUCOSE 88 09/08/2009 0920   BUN 12 09/08/2009 0920   CREATININE 0.84 09/08/2009 0920   CALCIUM 9.2 09/08/2009 0920   GFRNONAA >60 09/08/2009 0920   GFRAA  09/08/2009 0920    >60        The eGFR has been calculated using the MDRD equation. This calculation has not been validated in all clinical situations. eGFR's persistently <60 mL/min signify possible Chronic Kidney Disease.   No results found for: CHOL, HDL, LDLCALC, LDLDIRECT, TRIG, CHOLHDL No results found for: HGBA1C No results found for: VITAMINB12 No results found for: TSH    09/02/16 MRI brain 1. Normal appearance of the brain.  No explanation for symptoms. 2. Left TMJ arthropathy with ganglion.     ASSESSMENT AND PLAN  69 y.o. year old female here with intermittent right eyelid ptosis since 2018, with normal neurologic exam today.  Dx:  1. Ptosis of eyelid, right     PLAN:  - check labs; may consider empiric trial of mestinon  Orders Placed This Encounter  Procedures  . AChR Abs with Reflex to MuSK  . TSH  . Hemoglobin A1c  . CK   Return pending test results, for pending if symptoms worsen or fail to improve.    Penni Bombard, MD 58/11/9831, 8:25 PM Certified in Neurology, Neurophysiology and Neuroimaging  [facility] [address], [place], [place]  [address] [phone]

## 2020-02-26 LAB — HEMOGLOBIN A1C: Est. average glucose Bld gHb Est-mCnc: 137 mg/dL

## 2020-02-26 LAB — CK: Total CK: 99 U/L (ref 32–182)

## 2020-02-27 LAB — TSH: TSH: 0.895 u[IU]/mL (ref 0.450–4.500)

## 2020-03-03 DIAGNOSIS — H25813 Combined forms of age-related cataract, bilateral: Secondary | ICD-10-CM | POA: Diagnosis not present

## 2020-03-03 LAB — ACHR ABS WITH REFLEX TO MUSK: AChR Binding Ab, Serum: <0.03 nmol/L (ref 0.00–0.24)

## 2020-03-03 LAB — MUSK ANTIBODIES: MuSK Antibodies: <1 U/mL

## 2020-03-03 LAB — HEMOGLOBIN A1C: Hgb A1c MFr Bld: 6.4 % — ABNORMAL HIGH (ref 4.8–5.6)

## 2020-03-05 ENCOUNTER — Telehealth: Payer: Self-pay | Admitting: *Deleted

## 2020-03-12 ENCOUNTER — Encounter: Payer: Self-pay | Admitting: *Deleted

## 2020-03-23 NOTE — Telephone Encounter (Signed)
Sent my chart: Your labs look ok. Please FU with PCP to monitor A1C for pre diabetes.

## 2020-04-14 DIAGNOSIS — H2511 Age-related nuclear cataract, right eye: Secondary | ICD-10-CM | POA: Diagnosis not present

## 2020-04-24 DIAGNOSIS — Z1231 Encounter for screening mammogram for malignant neoplasm of breast: Secondary | ICD-10-CM | POA: Diagnosis not present

## 2020-07-07 DIAGNOSIS — G4733 Obstructive sleep apnea (adult) (pediatric): Secondary | ICD-10-CM | POA: Diagnosis not present

## 2020-07-07 DIAGNOSIS — I10 Essential (primary) hypertension: Secondary | ICD-10-CM | POA: Diagnosis not present

## 2020-07-07 DIAGNOSIS — E669 Obesity, unspecified: Secondary | ICD-10-CM | POA: Diagnosis not present

## 2020-07-07 DIAGNOSIS — I2510 Atherosclerotic heart disease of native coronary artery without angina pectoris: Secondary | ICD-10-CM | POA: Diagnosis not present

## 2020-07-07 DIAGNOSIS — F419 Anxiety disorder, unspecified: Secondary | ICD-10-CM | POA: Diagnosis not present

## 2020-07-07 DIAGNOSIS — E785 Hyperlipidemia, unspecified: Secondary | ICD-10-CM | POA: Diagnosis not present

## 2020-07-07 DIAGNOSIS — H259 Unspecified age-related cataract: Secondary | ICD-10-CM | POA: Diagnosis not present

## 2020-07-07 DIAGNOSIS — Z7982 Long term (current) use of aspirin: Secondary | ICD-10-CM | POA: Diagnosis not present

## 2020-07-07 DIAGNOSIS — H2511 Age-related nuclear cataract, right eye: Secondary | ICD-10-CM | POA: Diagnosis not present

## 2020-07-07 DIAGNOSIS — K219 Gastro-esophageal reflux disease without esophagitis: Secondary | ICD-10-CM | POA: Diagnosis not present

## 2020-07-28 DIAGNOSIS — H2512 Age-related nuclear cataract, left eye: Secondary | ICD-10-CM | POA: Diagnosis not present

## 2020-07-28 DIAGNOSIS — Z01818 Encounter for other preprocedural examination: Secondary | ICD-10-CM | POA: Diagnosis not present

## 2020-08-04 DIAGNOSIS — M797 Fibromyalgia: Secondary | ICD-10-CM | POA: Diagnosis not present

## 2020-08-04 DIAGNOSIS — K219 Gastro-esophageal reflux disease without esophagitis: Secondary | ICD-10-CM | POA: Diagnosis not present

## 2020-08-04 DIAGNOSIS — H25812 Combined forms of age-related cataract, left eye: Secondary | ICD-10-CM | POA: Diagnosis not present

## 2020-08-04 DIAGNOSIS — H2512 Age-related nuclear cataract, left eye: Secondary | ICD-10-CM | POA: Diagnosis not present

## 2020-08-04 DIAGNOSIS — I10 Essential (primary) hypertension: Secondary | ICD-10-CM | POA: Diagnosis not present

## 2020-08-04 DIAGNOSIS — E785 Hyperlipidemia, unspecified: Secondary | ICD-10-CM | POA: Diagnosis not present

## 2020-08-04 DIAGNOSIS — G4730 Sleep apnea, unspecified: Secondary | ICD-10-CM | POA: Diagnosis not present

## 2020-08-04 DIAGNOSIS — H259 Unspecified age-related cataract: Secondary | ICD-10-CM | POA: Diagnosis not present

## 2020-08-04 DIAGNOSIS — F419 Anxiety disorder, unspecified: Secondary | ICD-10-CM | POA: Diagnosis not present

## 2020-08-20 DIAGNOSIS — E7800 Pure hypercholesterolemia, unspecified: Secondary | ICD-10-CM | POA: Diagnosis not present

## 2020-08-20 DIAGNOSIS — F5101 Primary insomnia: Secondary | ICD-10-CM | POA: Diagnosis not present

## 2020-08-20 DIAGNOSIS — F419 Anxiety disorder, unspecified: Secondary | ICD-10-CM | POA: Diagnosis not present

## 2020-08-20 DIAGNOSIS — M797 Fibromyalgia: Secondary | ICD-10-CM | POA: Diagnosis not present

## 2020-08-20 DIAGNOSIS — I10 Essential (primary) hypertension: Secondary | ICD-10-CM | POA: Diagnosis not present

## 2020-08-20 DIAGNOSIS — J309 Allergic rhinitis, unspecified: Secondary | ICD-10-CM | POA: Diagnosis not present

## 2020-08-20 DIAGNOSIS — K219 Gastro-esophageal reflux disease without esophagitis: Secondary | ICD-10-CM | POA: Diagnosis not present

## 2020-08-20 DIAGNOSIS — G4733 Obstructive sleep apnea (adult) (pediatric): Secondary | ICD-10-CM | POA: Diagnosis not present

## 2020-08-20 DIAGNOSIS — K589 Irritable bowel syndrome without diarrhea: Secondary | ICD-10-CM | POA: Diagnosis not present

## 2020-09-04 DIAGNOSIS — T781XXD Other adverse food reactions, not elsewhere classified, subsequent encounter: Secondary | ICD-10-CM | POA: Diagnosis not present

## 2020-09-04 DIAGNOSIS — J310 Chronic rhinitis: Secondary | ICD-10-CM | POA: Diagnosis not present

## 2020-09-04 DIAGNOSIS — R21 Rash and other nonspecific skin eruption: Secondary | ICD-10-CM | POA: Diagnosis not present

## 2020-09-04 DIAGNOSIS — Z91013 Allergy to seafood: Secondary | ICD-10-CM | POA: Diagnosis not present

## 2020-09-21 DIAGNOSIS — Z779 Other contact with and (suspected) exposures hazardous to health: Secondary | ICD-10-CM | POA: Diagnosis not present

## 2020-09-21 DIAGNOSIS — N952 Postmenopausal atrophic vaginitis: Secondary | ICD-10-CM | POA: Diagnosis not present

## 2020-09-21 DIAGNOSIS — Z6836 Body mass index (BMI) 36.0-36.9, adult: Secondary | ICD-10-CM | POA: Diagnosis not present

## 2020-09-29 DIAGNOSIS — H524 Presbyopia: Secondary | ICD-10-CM | POA: Diagnosis not present

## 2020-11-25 DIAGNOSIS — G4733 Obstructive sleep apnea (adult) (pediatric): Secondary | ICD-10-CM | POA: Diagnosis not present

## 2021-02-09 DIAGNOSIS — H26491 Other secondary cataract, right eye: Secondary | ICD-10-CM | POA: Diagnosis not present

## 2021-02-24 DIAGNOSIS — R053 Chronic cough: Secondary | ICD-10-CM | POA: Diagnosis not present

## 2021-02-24 DIAGNOSIS — J309 Allergic rhinitis, unspecified: Secondary | ICD-10-CM | POA: Diagnosis not present

## 2021-02-24 DIAGNOSIS — F5101 Primary insomnia: Secondary | ICD-10-CM | POA: Diagnosis not present

## 2021-02-24 DIAGNOSIS — G259 Extrapyramidal and movement disorder, unspecified: Secondary | ICD-10-CM | POA: Diagnosis not present

## 2021-02-24 DIAGNOSIS — I10 Essential (primary) hypertension: Secondary | ICD-10-CM | POA: Diagnosis not present

## 2021-02-24 DIAGNOSIS — Z23 Encounter for immunization: Secondary | ICD-10-CM | POA: Diagnosis not present

## 2021-02-24 DIAGNOSIS — E7800 Pure hypercholesterolemia, unspecified: Secondary | ICD-10-CM | POA: Diagnosis not present

## 2021-02-24 DIAGNOSIS — M797 Fibromyalgia: Secondary | ICD-10-CM | POA: Diagnosis not present

## 2021-02-24 DIAGNOSIS — G4733 Obstructive sleep apnea (adult) (pediatric): Secondary | ICD-10-CM | POA: Diagnosis not present

## 2021-02-24 DIAGNOSIS — D508 Other iron deficiency anemias: Secondary | ICD-10-CM | POA: Diagnosis not present

## 2021-02-24 DIAGNOSIS — K219 Gastro-esophageal reflux disease without esophagitis: Secondary | ICD-10-CM | POA: Diagnosis not present

## 2021-04-27 DIAGNOSIS — M545 Low back pain, unspecified: Secondary | ICD-10-CM | POA: Diagnosis not present

## 2021-04-27 DIAGNOSIS — M25561 Pain in right knee: Secondary | ICD-10-CM | POA: Diagnosis not present

## 2021-04-30 DIAGNOSIS — Z1231 Encounter for screening mammogram for malignant neoplasm of breast: Secondary | ICD-10-CM | POA: Diagnosis not present

## 2021-05-04 DIAGNOSIS — M25561 Pain in right knee: Secondary | ICD-10-CM | POA: Diagnosis not present

## 2021-05-04 DIAGNOSIS — R2689 Other abnormalities of gait and mobility: Secondary | ICD-10-CM | POA: Diagnosis not present

## 2021-05-04 DIAGNOSIS — M6281 Muscle weakness (generalized): Secondary | ICD-10-CM | POA: Diagnosis not present

## 2021-05-04 DIAGNOSIS — M2560 Stiffness of unspecified joint, not elsewhere classified: Secondary | ICD-10-CM | POA: Diagnosis not present

## 2021-05-04 DIAGNOSIS — M5459 Other low back pain: Secondary | ICD-10-CM | POA: Diagnosis not present

## 2021-05-04 DIAGNOSIS — M79604 Pain in right leg: Secondary | ICD-10-CM | POA: Diagnosis not present

## 2021-05-04 DIAGNOSIS — R293 Abnormal posture: Secondary | ICD-10-CM | POA: Diagnosis not present

## 2021-05-11 DIAGNOSIS — M2560 Stiffness of unspecified joint, not elsewhere classified: Secondary | ICD-10-CM | POA: Diagnosis not present

## 2021-05-11 DIAGNOSIS — M79604 Pain in right leg: Secondary | ICD-10-CM | POA: Diagnosis not present

## 2021-05-11 DIAGNOSIS — M25561 Pain in right knee: Secondary | ICD-10-CM | POA: Diagnosis not present

## 2021-05-11 DIAGNOSIS — R293 Abnormal posture: Secondary | ICD-10-CM | POA: Diagnosis not present

## 2021-05-11 DIAGNOSIS — M5459 Other low back pain: Secondary | ICD-10-CM | POA: Diagnosis not present

## 2021-05-11 DIAGNOSIS — M6281 Muscle weakness (generalized): Secondary | ICD-10-CM | POA: Diagnosis not present

## 2021-05-17 DIAGNOSIS — M2560 Stiffness of unspecified joint, not elsewhere classified: Secondary | ICD-10-CM | POA: Diagnosis not present

## 2021-05-17 DIAGNOSIS — R293 Abnormal posture: Secondary | ICD-10-CM | POA: Diagnosis not present

## 2021-05-17 DIAGNOSIS — M25561 Pain in right knee: Secondary | ICD-10-CM | POA: Diagnosis not present

## 2021-05-17 DIAGNOSIS — M5459 Other low back pain: Secondary | ICD-10-CM | POA: Diagnosis not present

## 2021-05-17 DIAGNOSIS — M6281 Muscle weakness (generalized): Secondary | ICD-10-CM | POA: Diagnosis not present

## 2021-05-17 DIAGNOSIS — M79604 Pain in right leg: Secondary | ICD-10-CM | POA: Diagnosis not present

## 2021-05-18 DIAGNOSIS — H26492 Other secondary cataract, left eye: Secondary | ICD-10-CM | POA: Diagnosis not present

## 2021-05-18 DIAGNOSIS — H04123 Dry eye syndrome of bilateral lacrimal glands: Secondary | ICD-10-CM | POA: Diagnosis not present

## 2021-05-20 DIAGNOSIS — M5459 Other low back pain: Secondary | ICD-10-CM | POA: Diagnosis not present

## 2021-05-20 DIAGNOSIS — M6281 Muscle weakness (generalized): Secondary | ICD-10-CM | POA: Diagnosis not present

## 2021-05-20 DIAGNOSIS — R293 Abnormal posture: Secondary | ICD-10-CM | POA: Diagnosis not present

## 2021-05-20 DIAGNOSIS — M2560 Stiffness of unspecified joint, not elsewhere classified: Secondary | ICD-10-CM | POA: Diagnosis not present

## 2021-05-20 DIAGNOSIS — M25561 Pain in right knee: Secondary | ICD-10-CM | POA: Diagnosis not present

## 2021-05-20 DIAGNOSIS — M79604 Pain in right leg: Secondary | ICD-10-CM | POA: Diagnosis not present

## 2021-05-24 DIAGNOSIS — M5459 Other low back pain: Secondary | ICD-10-CM | POA: Diagnosis not present

## 2021-05-24 DIAGNOSIS — R293 Abnormal posture: Secondary | ICD-10-CM | POA: Diagnosis not present

## 2021-05-24 DIAGNOSIS — M79604 Pain in right leg: Secondary | ICD-10-CM | POA: Diagnosis not present

## 2021-05-24 DIAGNOSIS — M25561 Pain in right knee: Secondary | ICD-10-CM | POA: Diagnosis not present

## 2021-05-24 DIAGNOSIS — M2560 Stiffness of unspecified joint, not elsewhere classified: Secondary | ICD-10-CM | POA: Diagnosis not present

## 2021-05-24 DIAGNOSIS — M6281 Muscle weakness (generalized): Secondary | ICD-10-CM | POA: Diagnosis not present

## 2021-05-26 DIAGNOSIS — M79604 Pain in right leg: Secondary | ICD-10-CM | POA: Diagnosis not present

## 2021-05-26 DIAGNOSIS — M6281 Muscle weakness (generalized): Secondary | ICD-10-CM | POA: Diagnosis not present

## 2021-05-26 DIAGNOSIS — M25561 Pain in right knee: Secondary | ICD-10-CM | POA: Diagnosis not present

## 2021-05-26 DIAGNOSIS — R2689 Other abnormalities of gait and mobility: Secondary | ICD-10-CM | POA: Diagnosis not present

## 2021-05-26 DIAGNOSIS — R293 Abnormal posture: Secondary | ICD-10-CM | POA: Diagnosis not present

## 2021-05-26 DIAGNOSIS — M5459 Other low back pain: Secondary | ICD-10-CM | POA: Diagnosis not present

## 2021-05-26 DIAGNOSIS — M2560 Stiffness of unspecified joint, not elsewhere classified: Secondary | ICD-10-CM | POA: Diagnosis not present

## 2021-05-28 DIAGNOSIS — N6311 Unspecified lump in the right breast, upper outer quadrant: Secondary | ICD-10-CM | POA: Diagnosis not present

## 2021-05-28 DIAGNOSIS — N6312 Unspecified lump in the right breast, upper inner quadrant: Secondary | ICD-10-CM | POA: Diagnosis not present

## 2021-05-28 DIAGNOSIS — R928 Other abnormal and inconclusive findings on diagnostic imaging of breast: Secondary | ICD-10-CM | POA: Diagnosis not present

## 2021-06-01 DIAGNOSIS — M6281 Muscle weakness (generalized): Secondary | ICD-10-CM | POA: Diagnosis not present

## 2021-06-01 DIAGNOSIS — M79604 Pain in right leg: Secondary | ICD-10-CM | POA: Diagnosis not present

## 2021-06-01 DIAGNOSIS — R293 Abnormal posture: Secondary | ICD-10-CM | POA: Diagnosis not present

## 2021-06-01 DIAGNOSIS — M5459 Other low back pain: Secondary | ICD-10-CM | POA: Diagnosis not present

## 2021-06-01 DIAGNOSIS — M25561 Pain in right knee: Secondary | ICD-10-CM | POA: Diagnosis not present

## 2021-06-01 DIAGNOSIS — M2560 Stiffness of unspecified joint, not elsewhere classified: Secondary | ICD-10-CM | POA: Diagnosis not present

## 2021-06-04 DIAGNOSIS — M6281 Muscle weakness (generalized): Secondary | ICD-10-CM | POA: Diagnosis not present

## 2021-06-04 DIAGNOSIS — M5459 Other low back pain: Secondary | ICD-10-CM | POA: Diagnosis not present

## 2021-06-04 DIAGNOSIS — M2560 Stiffness of unspecified joint, not elsewhere classified: Secondary | ICD-10-CM | POA: Diagnosis not present

## 2021-06-04 DIAGNOSIS — M25561 Pain in right knee: Secondary | ICD-10-CM | POA: Diagnosis not present

## 2021-06-04 DIAGNOSIS — M79604 Pain in right leg: Secondary | ICD-10-CM | POA: Diagnosis not present

## 2021-06-04 DIAGNOSIS — R293 Abnormal posture: Secondary | ICD-10-CM | POA: Diagnosis not present

## 2021-06-07 DIAGNOSIS — M2560 Stiffness of unspecified joint, not elsewhere classified: Secondary | ICD-10-CM | POA: Diagnosis not present

## 2021-06-07 DIAGNOSIS — M25561 Pain in right knee: Secondary | ICD-10-CM | POA: Diagnosis not present

## 2021-06-07 DIAGNOSIS — M5459 Other low back pain: Secondary | ICD-10-CM | POA: Diagnosis not present

## 2021-06-07 DIAGNOSIS — M6281 Muscle weakness (generalized): Secondary | ICD-10-CM | POA: Diagnosis not present

## 2021-06-07 DIAGNOSIS — M79604 Pain in right leg: Secondary | ICD-10-CM | POA: Diagnosis not present

## 2021-06-07 DIAGNOSIS — R293 Abnormal posture: Secondary | ICD-10-CM | POA: Diagnosis not present

## 2021-06-08 ENCOUNTER — Other Ambulatory Visit: Payer: Self-pay | Admitting: Radiology

## 2021-06-08 DIAGNOSIS — Z170 Estrogen receptor positive status [ER+]: Secondary | ICD-10-CM | POA: Diagnosis not present

## 2021-06-08 DIAGNOSIS — C50919 Malignant neoplasm of unspecified site of unspecified female breast: Secondary | ICD-10-CM | POA: Insufficient documentation

## 2021-06-08 DIAGNOSIS — C50811 Malignant neoplasm of overlapping sites of right female breast: Secondary | ICD-10-CM | POA: Diagnosis not present

## 2021-06-08 DIAGNOSIS — C50111 Malignant neoplasm of central portion of right female breast: Secondary | ICD-10-CM | POA: Insufficient documentation

## 2021-06-11 DIAGNOSIS — M5459 Other low back pain: Secondary | ICD-10-CM | POA: Diagnosis not present

## 2021-06-11 DIAGNOSIS — M6281 Muscle weakness (generalized): Secondary | ICD-10-CM | POA: Diagnosis not present

## 2021-06-11 DIAGNOSIS — M2560 Stiffness of unspecified joint, not elsewhere classified: Secondary | ICD-10-CM | POA: Diagnosis not present

## 2021-06-11 DIAGNOSIS — M25561 Pain in right knee: Secondary | ICD-10-CM | POA: Diagnosis not present

## 2021-06-11 DIAGNOSIS — R293 Abnormal posture: Secondary | ICD-10-CM | POA: Diagnosis not present

## 2021-06-11 DIAGNOSIS — M79604 Pain in right leg: Secondary | ICD-10-CM | POA: Diagnosis not present

## 2021-06-14 ENCOUNTER — Ambulatory Visit: Payer: Self-pay | Admitting: Surgery

## 2021-06-14 DIAGNOSIS — C50811 Malignant neoplasm of overlapping sites of right female breast: Secondary | ICD-10-CM | POA: Diagnosis not present

## 2021-06-14 DIAGNOSIS — C50911 Malignant neoplasm of unspecified site of right female breast: Secondary | ICD-10-CM

## 2021-06-14 DIAGNOSIS — Z170 Estrogen receptor positive status [ER+]: Secondary | ICD-10-CM | POA: Diagnosis not present

## 2021-06-16 ENCOUNTER — Ambulatory Visit: Payer: Self-pay | Admitting: Surgery

## 2021-06-16 DIAGNOSIS — M25561 Pain in right knee: Secondary | ICD-10-CM | POA: Diagnosis not present

## 2021-06-16 DIAGNOSIS — M2560 Stiffness of unspecified joint, not elsewhere classified: Secondary | ICD-10-CM | POA: Diagnosis not present

## 2021-06-16 DIAGNOSIS — R293 Abnormal posture: Secondary | ICD-10-CM | POA: Diagnosis not present

## 2021-06-16 DIAGNOSIS — M5459 Other low back pain: Secondary | ICD-10-CM | POA: Diagnosis not present

## 2021-06-16 DIAGNOSIS — M6281 Muscle weakness (generalized): Secondary | ICD-10-CM | POA: Diagnosis not present

## 2021-06-16 DIAGNOSIS — M79604 Pain in right leg: Secondary | ICD-10-CM | POA: Diagnosis not present

## 2021-06-18 DIAGNOSIS — R293 Abnormal posture: Secondary | ICD-10-CM | POA: Diagnosis not present

## 2021-06-18 DIAGNOSIS — M2560 Stiffness of unspecified joint, not elsewhere classified: Secondary | ICD-10-CM | POA: Diagnosis not present

## 2021-06-18 DIAGNOSIS — M79604 Pain in right leg: Secondary | ICD-10-CM | POA: Diagnosis not present

## 2021-06-18 DIAGNOSIS — M25561 Pain in right knee: Secondary | ICD-10-CM | POA: Diagnosis not present

## 2021-06-18 DIAGNOSIS — M6281 Muscle weakness (generalized): Secondary | ICD-10-CM | POA: Diagnosis not present

## 2021-06-18 DIAGNOSIS — M5459 Other low back pain: Secondary | ICD-10-CM | POA: Diagnosis not present

## 2021-06-22 ENCOUNTER — Other Ambulatory Visit (HOSPITAL_COMMUNITY): Payer: Self-pay | Admitting: Surgery

## 2021-06-22 DIAGNOSIS — M2560 Stiffness of unspecified joint, not elsewhere classified: Secondary | ICD-10-CM | POA: Diagnosis not present

## 2021-06-22 DIAGNOSIS — M79604 Pain in right leg: Secondary | ICD-10-CM | POA: Diagnosis not present

## 2021-06-22 DIAGNOSIS — M25561 Pain in right knee: Secondary | ICD-10-CM | POA: Diagnosis not present

## 2021-06-22 DIAGNOSIS — C50811 Malignant neoplasm of overlapping sites of right female breast: Secondary | ICD-10-CM

## 2021-06-22 DIAGNOSIS — M5459 Other low back pain: Secondary | ICD-10-CM | POA: Diagnosis not present

## 2021-06-22 DIAGNOSIS — R293 Abnormal posture: Secondary | ICD-10-CM | POA: Diagnosis not present

## 2021-06-22 DIAGNOSIS — M6281 Muscle weakness (generalized): Secondary | ICD-10-CM | POA: Diagnosis not present

## 2021-06-25 DIAGNOSIS — M6281 Muscle weakness (generalized): Secondary | ICD-10-CM | POA: Diagnosis not present

## 2021-06-25 DIAGNOSIS — M79604 Pain in right leg: Secondary | ICD-10-CM | POA: Diagnosis not present

## 2021-06-25 DIAGNOSIS — M25561 Pain in right knee: Secondary | ICD-10-CM | POA: Diagnosis not present

## 2021-06-25 DIAGNOSIS — M2560 Stiffness of unspecified joint, not elsewhere classified: Secondary | ICD-10-CM | POA: Diagnosis not present

## 2021-06-25 DIAGNOSIS — M5459 Other low back pain: Secondary | ICD-10-CM | POA: Diagnosis not present

## 2021-06-25 DIAGNOSIS — R293 Abnormal posture: Secondary | ICD-10-CM | POA: Diagnosis not present

## 2021-06-29 ENCOUNTER — Other Ambulatory Visit: Payer: Self-pay | Admitting: Oncology

## 2021-06-29 DIAGNOSIS — M79604 Pain in right leg: Secondary | ICD-10-CM | POA: Diagnosis not present

## 2021-06-29 DIAGNOSIS — R2689 Other abnormalities of gait and mobility: Secondary | ICD-10-CM | POA: Diagnosis not present

## 2021-06-29 DIAGNOSIS — M5459 Other low back pain: Secondary | ICD-10-CM | POA: Diagnosis not present

## 2021-06-29 DIAGNOSIS — M25561 Pain in right knee: Secondary | ICD-10-CM | POA: Diagnosis not present

## 2021-06-29 DIAGNOSIS — M6281 Muscle weakness (generalized): Secondary | ICD-10-CM | POA: Diagnosis not present

## 2021-06-29 DIAGNOSIS — C50111 Malignant neoplasm of central portion of right female breast: Secondary | ICD-10-CM

## 2021-06-29 DIAGNOSIS — M2560 Stiffness of unspecified joint, not elsewhere classified: Secondary | ICD-10-CM | POA: Diagnosis not present

## 2021-06-29 DIAGNOSIS — R293 Abnormal posture: Secondary | ICD-10-CM | POA: Diagnosis not present

## 2021-06-30 ENCOUNTER — Inpatient Hospital Stay: Payer: Medicare Other

## 2021-06-30 ENCOUNTER — Inpatient Hospital Stay: Payer: Medicare Other | Attending: Oncology | Admitting: Oncology

## 2021-06-30 ENCOUNTER — Other Ambulatory Visit: Payer: Self-pay | Admitting: Oncology

## 2021-06-30 ENCOUNTER — Encounter: Payer: Self-pay | Admitting: Oncology

## 2021-06-30 VITALS — BP 139/68 | HR 66 | Temp 98.6°F | Resp 18 | Ht 63.0 in | Wt 210.2 lb

## 2021-06-30 DIAGNOSIS — C50111 Malignant neoplasm of central portion of right female breast: Secondary | ICD-10-CM

## 2021-06-30 DIAGNOSIS — Z780 Asymptomatic menopausal state: Secondary | ICD-10-CM

## 2021-06-30 DIAGNOSIS — Z170 Estrogen receptor positive status [ER+]: Secondary | ICD-10-CM

## 2021-06-30 DIAGNOSIS — C50811 Malignant neoplasm of overlapping sites of right female breast: Secondary | ICD-10-CM | POA: Diagnosis not present

## 2021-06-30 LAB — COMPREHENSIVE METABOLIC PANEL
Albumin: 4.2 (ref 3.5–5.0)
Calcium: 9.5 (ref 8.7–10.7)

## 2021-06-30 LAB — BASIC METABOLIC PANEL
BUN: 16 (ref 4–21)
CO2: 25 — AB (ref 13–22)
Chloride: 105 (ref 99–108)
Creatinine: 0.7 (ref 0.5–1.1)
Glucose: 109
Potassium: 4.2 mEq/L (ref 3.5–5.1)
Sodium: 140 (ref 137–147)

## 2021-06-30 LAB — CBC AND DIFFERENTIAL
HCT: 41 (ref 36–46)
Hemoglobin: 13.2 (ref 12.0–16.0)
Neutrophils Absolute: 2.49
Platelets: 205 10*3/uL (ref 150–400)
WBC: 4.7

## 2021-06-30 LAB — HEPATIC FUNCTION PANEL
ALT: 29 U/L (ref 7–35)
AST: 29 (ref 13–35)
Alkaline Phosphatase: 58 (ref 25–125)
Bilirubin, Total: 0.6

## 2021-06-30 LAB — CBC: RBC: 4.74 (ref 3.87–5.11)

## 2021-06-30 NOTE — Progress Notes (Signed)
Initial face to face visit with pt, her husband and daughter in [place] exam room. Pt is here today for her initial med/onc consult with Dr. Hinton Rao. She is scheduled for surgery on 07/15/2021 with Dr. Brantley Stage. A copy of "The Breast Cancer Treatment Handbook" was givne to the pt along with the navigator's contact info. Encouraged pt to call with questions and concerns. ?

## 2021-07-01 ENCOUNTER — Telehealth: Payer: Self-pay

## 2021-07-01 ENCOUNTER — Other Ambulatory Visit: Payer: Self-pay

## 2021-07-01 DIAGNOSIS — Z170 Estrogen receptor positive status [ER+]: Secondary | ICD-10-CM

## 2021-07-01 NOTE — Telephone Encounter (Signed)
-----   Message from [name], MD sent at 06/30/2021 12:56 PM EDT ----- ?Regarding: refer ?Pls refer to Genetics, mult family members with breast cancer, mat aunt with ovarian, and mother with colon cancer age 71 ? ?

## 2021-07-01 NOTE — Telephone Encounter (Signed)
Referral sent to genetic electronically ? ?

## 2021-07-05 ENCOUNTER — Ambulatory Visit (HOSPITAL_COMMUNITY)
Admission: RE | Admit: 2021-07-05 | Discharge: 2021-07-05 | Disposition: A | Discharge status: Home / Self Care | Payer: Medicare Other | Source: Ambulatory Visit | Attending: Surgery | Admitting: Surgery

## 2021-07-05 DIAGNOSIS — M6281 Muscle weakness (generalized): Secondary | ICD-10-CM | POA: Diagnosis not present

## 2021-07-05 DIAGNOSIS — C50811 Malignant neoplasm of overlapping sites of right female breast: Secondary | ICD-10-CM | POA: Insufficient documentation

## 2021-07-05 DIAGNOSIS — R293 Abnormal posture: Secondary | ICD-10-CM | POA: Diagnosis not present

## 2021-07-05 DIAGNOSIS — Z170 Estrogen receptor positive status [ER+]: Secondary | ICD-10-CM | POA: Diagnosis not present

## 2021-07-05 DIAGNOSIS — M5459 Other low back pain: Secondary | ICD-10-CM | POA: Diagnosis not present

## 2021-07-05 DIAGNOSIS — C50911 Malignant neoplasm of unspecified site of right female breast: Secondary | ICD-10-CM | POA: Diagnosis not present

## 2021-07-05 DIAGNOSIS — M79604 Pain in right leg: Secondary | ICD-10-CM | POA: Diagnosis not present

## 2021-07-05 DIAGNOSIS — M25561 Pain in right knee: Secondary | ICD-10-CM | POA: Diagnosis not present

## 2021-07-05 DIAGNOSIS — M2560 Stiffness of unspecified joint, not elsewhere classified: Secondary | ICD-10-CM | POA: Diagnosis not present

## 2021-07-05 MED ORDER — GADOBUTROL 1 MMOL/ML IV SOLN
10.0000 mL | Freq: Once | INTRAVENOUS | Status: AC | PRN
  Administered 2021-07-05: 10 mL via INTRAVENOUS

## 2021-07-06 ENCOUNTER — Other Ambulatory Visit: Payer: Self-pay | Admitting: Surgery

## 2021-07-06 DIAGNOSIS — R9389 Abnormal findings on diagnostic imaging of other specified body structures: Secondary | ICD-10-CM

## 2021-07-07 ENCOUNTER — Telehealth: Payer: Self-pay

## 2021-07-07 ENCOUNTER — Telehealth: Payer: Self-pay | Admitting: Oncology

## 2021-07-07 DIAGNOSIS — M2560 Stiffness of unspecified joint, not elsewhere classified: Secondary | ICD-10-CM | POA: Diagnosis not present

## 2021-07-07 DIAGNOSIS — R293 Abnormal posture: Secondary | ICD-10-CM | POA: Diagnosis not present

## 2021-07-07 DIAGNOSIS — M6281 Muscle weakness (generalized): Secondary | ICD-10-CM | POA: Diagnosis not present

## 2021-07-07 DIAGNOSIS — M79604 Pain in right leg: Secondary | ICD-10-CM | POA: Diagnosis not present

## 2021-07-07 DIAGNOSIS — M5459 Other low back pain: Secondary | ICD-10-CM | POA: Diagnosis not present

## 2021-07-07 DIAGNOSIS — M25561 Pain in right knee: Secondary | ICD-10-CM | POA: Diagnosis not present

## 2021-07-07 NOTE — Telephone Encounter (Signed)
-----  Message from [name], MD sent at 06/29/2021 12:24 PM EDT ----- ?Regarding: path, MRI ?I have path from [place], see if [place] done yet for HER2.  Also Dr. Brantley Stage ordered MRI, is it done yet? ? ?

## 2021-07-07 NOTE — Telephone Encounter (Signed)
4/12/23LVM-Appt on 07/29/21 at 230pm ?

## 2021-07-07 NOTE — Progress Notes (Signed)
Surgical Instructions ? ? ? Your procedure is scheduled on Thursday April 20th. ? Report to [facility] Main Entrance "A" at 9 A.M., then check in with the Admitting office. ? Call this number if you have problems the morning of surgery: ? [phone] ? ? If you have any questions prior to your surgery date call [phone]: Open Monday-Friday 8am-4pm ? ? ? Remember: ? Do not eat after midnight the night before your surgery ? ?You may drink clear liquids until 8 the morning of your surgery.   ?Clear liquids allowed are: Water, Non-Citrus Juices (without pulp), Carbonated Beverages, Clear Tea, Black Coffee ONLY (NO MILK, CREAM OR POWDERED CREAMER of any kind), and Gatorade ?  ? Take these medicines the morning of surgery with A SIP OF WATER: ?amLODipine (NORVASC) 5 MG tablet ?atenolol (TENORMIN) 50 MG tablet ?DULoxetine (CYMBALTA) 60 MG capsule ?fexofenadine (ALLEGRA) 180 MG tablet ?fluticasone (FLONASE) 50 MCG/ACT nasal spray ?pantoprazole (PROTONIX) 40 MG tablet ?Propylene Glycol (SYSTANE BALANCE) 0.6 % SOLN ? ?IF NEEDED  ?acetaminophen (TYLENOL) 650 MG CR tablet ?cetirizine (ZYRTEC) 10 MG tablet ?cyclobenzaprine (FLEXERIL) 5 MG tablet ?dicyclomine (BENTYL) 10 MG capsule ?diphenhydrAMINE (BENADRYL) 25 MG tablet ?EPINEPHrine 0.3 mg/0.3 mL IJ SOAJ injection ?ondansetron (ZOFRAN) 4 MG tablet ?polyvinyl alcohol (LIQUIFILM TEARS) 1.4 % ophthalmic solution ? ?Follow your surgeon's instructions on when to stop Aspirin.  If no instructions were given by your surgeon then you will need to call the office to get those instructions.   ? ? ?As of today, STOP taking any Aspirin (unless otherwise instructed by your surgeon) Voltaren, Aleve, Naproxen, Ibuprofen, Motrin, Advil, Goody's, BC's, all herbal medications, fish oil, and all vitamins. ? ?         ?Do not wear jewelry or makeup ?Do not wear lotions, powders, perfumes, or deodorant. ?Do not shave 48 hours prior to surgery.   ?Do not bring valuables to the hospital. ?Do not  wear nail polish, gel polish, artificial nails, or any other type of covering on natural nails (fingers and toes) ?If you have artificial nails or gel coating that need to be removed by a nail salon, please have this removed prior to surgery. Artificial nails or gel coating may interfere with anesthesia's ability to adequately monitor your vital signs. ? ? is not responsible for any belongings or valuables. .  ? ?Do NOT Smoke (Tobacco/Vaping)  24 hours prior to your procedure ? ?If you use a CPAP at night, you may bring your mask for your overnight stay. ?  ?Contacts, glasses, hearing aids, dentures or partials may not be worn into surgery, please bring cases for these belongings ?  ?For patients admitted to the hospital, discharge time will be determined by your treatment team. ?  ?Patients discharged the day of surgery will not be allowed to drive home, and someone needs to stay with them for 24 hours. ? ? ?SURGICAL WAITING ROOM VISITATION ?Patients having surgery or a procedure in a hospital may have two support people. ?Children under the age of 39 must have an adult with them who is not the patient. ?They may stay in the waiting area during the procedure and may switch out with other visitors. If the patient needs to stay at the hospital during part of their recovery, the visitor guidelines for inpatient rooms apply. ? ?Please refer to the [facility] website for the visitor guidelines for Inpatients (after your surgery is over and you are in a regular room).  ? ? ? ? ? ?  Special instructions:   ? ?Oral Hygiene is also important to reduce your risk of infection.  Remember - BRUSH YOUR TEETH THE MORNING OF SURGERY WITH YOUR REGULAR TOOTHPASTE ? ? ?[place]- Preparing For Surgery ? ?Before surgery, you can play an important role. Because skin is not sterile, your skin needs to be as free of germs as possible. You can reduce the number of germs on your skin by washing with CHG (chlorahexidine  gluconate) Soap before surgery.  CHG is an antiseptic cleaner which kills germs and bonds with the skin to continue killing germs even after washing.   ? ? ?Please do not use if you have an allergy to CHG or antibacterial soaps. If your skin becomes reddened/irritated stop using the CHG.  ?Do not shave (including legs and underarms) for at least 48 hours prior to first CHG shower. It is OK to shave your face. ? ?Please follow these instructions carefully. ?  ? ? Shower the NIGHT BEFORE SURGERY and the MORNING OF SURGERY with CHG Soap.  ? If you chose to wash your hair, wash your hair first as usual with your normal shampoo. After you shampoo, rinse your hair and body thoroughly to remove the shampoo.  Then [employer] and genitals (private parts) with your normal soap and rinse thoroughly to remove soap. ? ?After that Use CHG Soap as you would any other liquid soap. You can apply CHG directly to the skin and wash gently with a scrungie or a clean washcloth.  ? ?Apply the CHG Soap to your body ONLY FROM THE NECK DOWN.  Do not use on open wounds or open sores. Avoid contact with your eyes, ears, mouth and genitals (private parts). Wash Face and genitals (private parts)  with your normal soap.  ? ?Wash thoroughly, paying special attention to the area where your surgery will be performed. ? ?Thoroughly rinse your body with warm water from the neck down. ? ?DO NOT shower/wash with your normal soap after using and rinsing off the CHG Soap. ? ?Pat yourself dry with a CLEAN TOWEL. ? ?Wear CLEAN PAJAMAS to bed the night before surgery ? ?Place CLEAN SHEETS on your bed the night before your surgery ? ?DO NOT SLEEP WITH PETS. ? ? ?Day of Surgery: ? ?Take a shower with CHG soap. ?Wear Clean/Comfortable clothing the morning of surgery ?Do not apply any deodorants/lotions.   ?Remember to brush your teeth WITH YOUR REGULAR TOOTHPASTE. ? ? ? ?If you received a COVID test during your pre-op visit  it is requested that you wear a mask  when out in public, stay away from anyone that may not be feeling well and notify your surgeon if you develop symptoms. If you have been in contact with anyone that has tested positive in the last 10 days please notify you surgeon. ? ?  ?Please read over the following fact sheets that you were given.  ? ?

## 2021-07-07 NOTE — Telephone Encounter (Signed)
Printed and gave to Dr. McCarty ?

## 2021-07-08 ENCOUNTER — Encounter (HOSPITAL_COMMUNITY)
Admission: RE | Admit: 2021-07-08 | Discharge: 2021-07-08 | Disposition: A | Discharge status: Home / Self Care | Payer: Medicare Other | Source: Ambulatory Visit | Attending: Surgery | Admitting: Surgery

## 2021-07-11 NOTE — Progress Notes (Signed)
[facility] ?CONSULT NOTE ? ?Patient Care Team: ?[name], MD as PCP - General (Family Medicine) ?[name], MD as Consulting Physician (Gastroenterology) ?[name], RN as Registered Nurse ? ?Assessment & Plan   ? ?Invasive lobular carcinoma ?This was found on screening mammogram but the MRI reveals focal enhancement surrounding the biopsy clip up to 2.3 cm and there is non-mass enhancement posterior to this known malignancy on MRI.  This area may need to be biopsied in order to guide her ultimate surgery.This is strongly ER/PR positive, HER2 negative and has a low Ki 67 of less than 5%.  Her prognosis is good but we may want to do Endo predict testing after her surgery is done. ? ?Strong family history ?Including multiple females with breast cancer, her mother had colon cancer at age 51, and another maternal aunt had ovarian cancer.  I therefore feel she is appropriate for a genetics consultation and we will arrange that. ? ?She will have her surgery through Dr. Erroll Luna and I will plan to see her next month postop to review the final pathology.  If she has a lumpectomy, we will also recommend radiation therapy.  At that time we will decide whether to pursue genomic testing with Endopredict or Oncotype.  I will make a referral to the genetics clinic due to her strong family history.  She will definitely need hormonal therapy and so I will schedule a baseline bone density scan. I discussed the assessment and treatment plan with the patient and her family.  They were provided an opportunity to ask questions and all were answered.  They agreed with the plan and demonstrated an understanding of the instructions.  The patient was advised to call back if she has further questions ? ?Thank you for the opportunity to participate in the care of your patients. ? ? ?I provided 50 minutes of face-to-face time during this this encounter and > 50% was spent counseling as documented under my  assessment and plan.  ? ? ?[name], MD ?[facility] ?[place] ?[place] [address] ?Dept: [phone] ?Dept Fax: [phone]  ? ? ? ?CHIEF COMPLAINTS/PURPOSE OF CONSULTATION:  ?New breast cancer ? ?HISTORY OF PRESENTING ILLNESS:  ?Doris Reyes 71 y.o. 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  She does have annual mammograms because of her strong family history, but was on hormone replacement therapy for 10 years.  She had a screening mammogram at [facility] on May 05, 2021 which revealed architectural distortion which was felt to be indeterminant.  She had a diagnostic mammogram on March 3 which showed mild nipple retraction and a 1 cm area of focal asymmetry in the retroareolar region.  Ultrasound revealed a hypoechoic irregular mass measuring 9 mm in that area.  On March 14 she had a biopsy performed which revealed a 0.9 cm irregular mass with spiculated margins in the right breast at 12:00, approximately 2 cm from the nipple and anteriorly in the breast.  This was found to be grade 2 invasive mammary carcinoma as well as mammary carcinoma in situ.  The E-cadherin immunohistochemistry stains were negative and so this is consistent with invasive lobular carcinoma.  The carcinoma in situ was positive for E-cadherin, consistent with ductal carcinoma in situ.  She has seen Dr. Brantley Stage and he does plan surgery but scheduled an MRI of the  breast first.  This revealed the known malignancy measuring 2.3 cm in the anterior right breast but also clumped non-mass enhancement 1.6 cm posterior to this area and spanning up to 2.2 cm.  The entire area together spans 5.8 cm in craniocaudal dimension.  There is low-grade enhancement between the known malignancy and the nipple which is only slightly greater than the same finding on the left.  It was  recommended that she have an MRI guided biopsy of the clumped non-mass enhancement area.    The estrogen receptors are positive at 95% with moderate staining and the progesterone receptors are also 95% with strong staining.  HER2 was 2+ but FISH was negative, and Ki 67 is less than 5%.  This lesion appears to be arising in usual ductal hyperplasia. The cancer was detected by screening mammogram. The cancer was not palpable prior to diagnosis. ? ?I reviewed her records extensively and collaborated the history with the patient. ? ?SUMMARY OF ONCOLOGIC HISTORY: ?Oncology History  ? No history exists.  ? ? ?In terms of breast cancer risk profile:  ?She menarched at early age of 37-13 and went to surgical menopause at age 72 ?She had 1 pregnancy, her first child was born at age 27 ?She was exposed hormone replacement therapy for 10 years in the form of a patch.  ?She has significant positive family history of Breast and ovarian and colon cancer ? ?INTERVAL HISTORY: ?Currently, she is healing well after her biopsy apart from mild discomfort.  Her surgery is scheduled for April 20.  They understand she will have a sentinel lymph node biopsy as well and I explained this technique to them.  She will also see Dr. Gatha Mayer of radiation oncology today.  Her CBC and comprehensive metabolic profile are unremarkable. She has not had a bone density scan in a long time.  She is up-to-date on colonoscopy.The patient is here with her husband [name] and daughter [name] today.  She has never smoked and does not drink alcohol or use drugs.  She denies pain.  She denies cough, shortness of breath, hemoptysis, chest pain or wheezing.  She denies nausea, vomiting, bowel problems or abdominal pain.  She denies fevers, chills or night sweats. ? ?MEDICAL HISTORY:  ?Past Medical History:  ?Diagnosis Date  ? Anxiety   ? Complication of anesthesia   ? difficulty waking up  ? Diverticulitis   ? Fibromyalgia   ? GERD (gastroesophageal reflux  disease)   ? History of colonic polyps   ? Hyperlipidemia   ? Hypertension   ? IBS (irritable bowel syndrome)   ? Ischemic colitis ([place]) 03/2012  ? Osteoporosis   ? RLS (restless legs syndrome)   ? Sinusitis   ? Sleep apnea   ? CPAP  ?Vitamin D deficiency ?Iron deficiency treated with IV iron infusions in 2019 ? ?SURGICAL HISTORY: ?Past Surgical History:  ?Procedure Laterality Date  ? CHOLECYSTECTOMY  2004  ? COLONOSCOPY WITH PROPOFOL N/A 04/18/2017  ? Procedure: COLONOSCOPY WITH PROPOFOL;  Surgeon: [name], MD;  Location: [facility];  Service: Endoscopy;  Laterality: N/A;  ? ESOPHAGOGASTRODUODENOSCOPY (EGD) WITH PROPOFOL N/A 04/18/2017  ? Procedure: ESOPHAGOGASTRODUODENOSCOPY (EGD) WITH PROPOFOL;  Surgeon: [name], MD;  Location: [facility];  Service: Endoscopy;  Laterality: N/A;  ? FLEXIBLE SIGMOIDOSCOPY  04/20/2012  ? Procedure: FLEXIBLE SIGMOIDOSCOPY;  Surgeon: Beryle Beams, MD;  Location: [facility];  Service: Endoscopy;  Laterality: N/A;  ? NISSEN FUNDOPLICATION  8756  ? PARAESOPHAGEAL HERNIA REPAIR  09/11/2009  ?  and Nissen fundoplication  ? TONSILLECTOMY  1960's  ? TOTAL ABDOMINAL HYSTERECTOMY  2001  ?She had 2 large benign tumors of the ovaries removed with bilateral salpingo oophorectomy ? ?SOCIAL HISTORY: ?Social History  ? ?Socioeconomic History  ? Marital status: Married  ?  Spouse name: Jeneen Rinks  ? Number of children: 1  ? Years of education: Not on file  ? Highest education level: Some college, no degree  ?Occupational History  ? Occupation: disabled  ?Tobacco Use  ? Smoking status: Never  ? Smokeless tobacco: Never  ? Tobacco comments:  ?  brief exposure through her husband  ?Vaping Use  ? Vaping Use: Never used  ?Substance and Sexual Activity  ? Alcohol use: Never  ?  Alcohol/week: 0.0 standard drinks  ? Drug use: Never  ? Sexual activity: Not Currently  ?Other Topics Concern  ? Not on file  ?Social History Narrative  ? From [place] originally. Previously lived in [place]. Previously worked at  [employer] also in a [employer]. Has also worked as a Network engineer for [employer]. No pets currently. No bird exposure. No known mold in her current home.   ? Lives with husband  ? Caffeine- coffee 2 c

## 2021-07-12 ENCOUNTER — Encounter: Payer: Self-pay | Admitting: Hematology and Oncology

## 2021-07-12 ENCOUNTER — Telehealth: Payer: Self-pay | Admitting: Genetic Counselor

## 2021-07-12 NOTE — Telephone Encounter (Signed)
Called patient to move up genetics visit due to surgery on 5/10.  Scheduled 11am on 4/19.  Patient preferred in-person visit.  ?

## 2021-07-13 DIAGNOSIS — M5459 Other low back pain: Secondary | ICD-10-CM | POA: Diagnosis not present

## 2021-07-13 DIAGNOSIS — M2560 Stiffness of unspecified joint, not elsewhere classified: Secondary | ICD-10-CM | POA: Diagnosis not present

## 2021-07-13 DIAGNOSIS — M810 Age-related osteoporosis without current pathological fracture: Secondary | ICD-10-CM | POA: Diagnosis not present

## 2021-07-13 DIAGNOSIS — R293 Abnormal posture: Secondary | ICD-10-CM | POA: Diagnosis not present

## 2021-07-13 DIAGNOSIS — M25561 Pain in right knee: Secondary | ICD-10-CM | POA: Diagnosis not present

## 2021-07-13 DIAGNOSIS — M6281 Muscle weakness (generalized): Secondary | ICD-10-CM | POA: Diagnosis not present

## 2021-07-13 DIAGNOSIS — M85851 Other specified disorders of bone density and structure, right thigh: Secondary | ICD-10-CM | POA: Diagnosis not present

## 2021-07-13 DIAGNOSIS — Z780 Asymptomatic menopausal state: Secondary | ICD-10-CM | POA: Diagnosis not present

## 2021-07-13 DIAGNOSIS — M79604 Pain in right leg: Secondary | ICD-10-CM | POA: Diagnosis not present

## 2021-07-13 DIAGNOSIS — M8589 Other specified disorders of bone density and structure, multiple sites: Secondary | ICD-10-CM | POA: Diagnosis not present

## 2021-07-14 ENCOUNTER — Inpatient Hospital Stay: Payer: Medicare Other

## 2021-07-14 ENCOUNTER — Other Ambulatory Visit: Payer: Self-pay | Admitting: Genetic Counselor

## 2021-07-14 ENCOUNTER — Encounter: Payer: Self-pay | Admitting: Genetic Counselor

## 2021-07-14 ENCOUNTER — Encounter: Payer: Self-pay | Admitting: Oncology

## 2021-07-14 ENCOUNTER — Other Ambulatory Visit: Payer: Self-pay

## 2021-07-14 ENCOUNTER — Inpatient Hospital Stay (HOSPITAL_BASED_OUTPATIENT_CLINIC_OR_DEPARTMENT_OTHER): Payer: Medicare Other | Admitting: Genetic Counselor

## 2021-07-14 DIAGNOSIS — Z8041 Family history of malignant neoplasm of ovary: Secondary | ICD-10-CM | POA: Diagnosis not present

## 2021-07-14 DIAGNOSIS — Z170 Estrogen receptor positive status [ER+]: Secondary | ICD-10-CM

## 2021-07-14 DIAGNOSIS — Z803 Family history of malignant neoplasm of breast: Secondary | ICD-10-CM

## 2021-07-14 DIAGNOSIS — C50111 Malignant neoplasm of central portion of right female breast: Secondary | ICD-10-CM

## 2021-07-14 DIAGNOSIS — Z8042 Family history of malignant neoplasm of prostate: Secondary | ICD-10-CM | POA: Diagnosis not present

## 2021-07-14 LAB — GENETIC SCREENING ORDER

## 2021-07-14 NOTE — Progress Notes (Signed)
REFERRING PROVIDER: ?[name], MD ?[address] ?[address] ?[place],  [place] [address] ? ?PRIMARY PROVIDER:  ?[name], MD ? ?PRIMARY REASON FOR VISIT:  ?1. Family history of breast cancer   ?2. Family history of ovarian cancer   ?3. Family history of prostate cancer   ?4. Malignant neoplasm of central portion of right breast in female, estrogen receptor positive ([place])   ? ? ? ?HISTORY OF PRESENT ILLNESS:   ?[name], a 71 y.o. female, was seen for a [facility] cancer genetics consultation at the request of Dr. Kenton Kingfisher due to a personal and family history of cancer.  [name] presents to clinic today to discuss the possibility of a hereditary predisposition to cancer, genetic testing, and to further clarify her future cancer risks, as well as potential cancer risks for family members.  ? ?In 2023, at the age of 22, Ms. Inch was diagnosed with breast cancer. She is scheduled for surgery on May 10.  ? ?CANCER HISTORY:  ?Oncology History  ? No history exists.  ? ? ? ?RISK FACTORS:  ?Menarche was at age 66-13.  ?First live birth at age 44.  ?OCP use for approximately 1 years.  ?Ovaries intact: no.  ?Hysterectomy: yes.  ?Menopausal status: postmenopausal.  ?Colonoscopy: yes;  6-7 polyps . ?Mammogram within the last year: yes. ?Number of breast biopsies: 1. ?Up to date with pelvic exams: yes. ?Any excessive radiation exposure in the past: no ? ?Past Medical History:  ?Diagnosis Date  ? Anxiety   ? Complication of anesthesia   ? difficulty waking up  ? Diverticulitis   ? Family history of breast cancer   ? Family history of ovarian cancer   ? Family history of prostate cancer   ? Fibromyalgia   ? GERD (gastroesophageal reflux disease)   ? History of colonic polyps   ? Hyperlipidemia   ? Hypertension   ? IBS (irritable bowel syndrome)   ? Ischemic colitis ([place]) 03/2012  ? Osteoporosis   ? RLS (restless legs syndrome)   ? Sinusitis   ? Sleep apnea   ? CPAP  ? ? ?Past Surgical History:  ?Procedure  Laterality Date  ? CHOLECYSTECTOMY  2004  ? COLONOSCOPY WITH PROPOFOL N/A 04/18/2017  ? Procedure: COLONOSCOPY WITH PROPOFOL;  Surgeon: [name], MD;  Location: [facility];  Service: Endoscopy;  Laterality: N/A;  ? ESOPHAGOGASTRODUODENOSCOPY (EGD) WITH PROPOFOL N/A 04/18/2017  ? Procedure: ESOPHAGOGASTRODUODENOSCOPY (EGD) WITH PROPOFOL;  Surgeon: [name], MD;  Location: [facility];  Service: Endoscopy;  Laterality: N/A;  ? FLEXIBLE SIGMOIDOSCOPY  04/20/2012  ? Procedure: FLEXIBLE SIGMOIDOSCOPY;  Surgeon: Beryle Beams, MD;  Location: [facility];  Service: Endoscopy;  Laterality: N/A;  ? NISSEN FUNDOPLICATION  6045  ? PARAESOPHAGEAL HERNIA REPAIR  09/11/2009  ? and Nissen fundoplication  ? TONSILLECTOMY  1960's  ? TOTAL ABDOMINAL HYSTERECTOMY  2001  ? ? ?Social History  ? ?Socioeconomic History  ? Marital status: Married  ?  Spouse name: Jeneen Rinks  ? Number of children: 1  ? Years of education: Not on file  ? Highest education level: Some college, no degree  ?Occupational History  ? Occupation: disabled  ?Tobacco Use  ? Smoking status: Never  ? Smokeless tobacco: Never  ? Tobacco comments:  ?  brief exposure through her husband  ?Vaping Use  ? Vaping Use: Never used  ?Substance and Sexual Activity  ? Alcohol use: Never  ?  Alcohol/week: 0.0 standard drinks  ? Drug use: Never  ?  Sexual activity: Not Currently  ?Other Topics Concern  ? Not on file  ?Social History Narrative  ? From [place] originally. Previously lived in [place]. Previously worked at [employer] also in a [employer]. Has also worked as a Network engineer for [employer]. No pets currently. No bird exposure. No known mold in her current home.   ? Lives with husband  ? Caffeine- coffee 2 c daily  ? ?Social Determinants of Health  ? ?Financial Resource Strain: Not on file  ?Food Insecurity: Not on file  ?Transportation Needs: Not on file  ?Physical Activity: Not on file  ?Stress: Not on file  ?Social Connections: Not on file  ?  ? ?FAMILY  HISTORY:  ?We obtained a detailed, 4-generation family history.  Significant diagnoses are listed below: ?Family History  ?Problem Relation Age of Onset  ? Colon cancer Mother 33  ? Alzheimer's disease Mother   ? Heart attack Father   ? Cancer Maternal Aunt   ?     x2  ? Alzheimer's disease Maternal Aunt   ? Ovarian cancer Maternal Aunt   ? Prostate cancer Maternal Uncle   ? Kidney disease Maternal Uncle   ? Atrial fibrillation Maternal Uncle   ? Stroke Paternal Uncle   ? Aneurysm Paternal Grandmother   ?     brain  ? Stroke Paternal Grandfather   ? Prostate cancer Cousin   ?     paternal first cousin  ? Prostate cancer Cousin   ?     paternal first cousin  ? Esophageal cancer Cousin   ?     maternal first cousin  ? Breast cancer Cousin   ?     DCIS, maternal first cousin  ? Ovarian cancer Cousin   ?     maternal first cousin  ? Rheum arthritis Other   ? Lung disease Neg Hx   ? ? ? ?The patient has one daughter who is cancer free.  She has one brother who is healthy and cancer free.  Her parents are both deceased. ? ?The patient's mother had colon cancer at 67.  She had 14 brothers and sisters.  One brother had prostate cancer, one sister had ovarian cancer and one sister had breast cancer.  She has three maternal cousins one each with breast, ovarian and esophageal cancer.  The maternal grandparents are deceased. ? ?The patient's father died of a heart attack at 73.  He had 4 brothers and a sister.  She has two paternal cousins with prostate cancer, and one of these cousins had a daughter who had ovarian cancer in her 57's and a benign brain tumor in her 43's. ? ?Ms. Befort is unaware of previous family history of genetic testing for hereditary cancer risks. Patient's maternal ancestors are of [place] descent, and paternal ancestors are of Caucasian descent. There is no reported Ashkenazi Jewish ancestry. There is no known consanguinity. ? ?GENETIC COUNSELING ASSESSMENT: Ms. Lievanos is a 71 y.o. female with a  personal and family history of cancer which is somewhat suggestive of a hereditary cancer syndrome and predisposition to cancer given the combination of cancer and young ages of onset. We, therefore, discussed and recommended the following at today's visit.  ? ?DISCUSSION: We discussed that, in general, most cancer is not inherited in families, but instead is sporadic or familial. Sporadic cancers occur by chance and typically happen at older ages (>50 years) as this type of cancer is caused by genetic changes acquired during an individual?s  lifetime. Some families have more cancers than would be expected by chance; however, the ages or types of cancer are not consistent with a known genetic mutation or known genetic mutations have been ruled out. This type of familial cancer is thought to be due to a combination of multiple genetic, environmental, hormonal, and lifestyle factors. While this combination of factors likely increases the risk of cancer, the exact source of this risk is not currently identifiable or testable. ? ?We discussed that 5 - 10% of breast cancer is hereditary, with most cases associated with BRCA mutations.  There are other genes that can be associated with hereditary breast cancer syndromes.  These include ATM, CHEK2 and PALB2.  We discussed that testing is beneficial for several reasons including knowing how to follow individuals after completing their treatment, identifying whether potential treatment options such as PARP inhibitors would be beneficial, and understand if other family members could be at risk for cancer and allow them to undergo genetic testing.  ? ?We reviewed the characteristics, features and inheritance patterns of hereditary cancer syndromes. We also discussed genetic testing, including the appropriate family members to test, the process of testing, insurance coverage and turn-around-time for results. We discussed the implications of a negative, positive, carrier and/or  variant of uncertain significant result. We recommended Ms. Melder pursue genetic testing for the BRCAPlus panel  Once completed the CancerNext-Expanded+RNAinsight gene panel would be completed.  ? ?The CancerNext[facility]E

## 2021-07-16 DIAGNOSIS — R293 Abnormal posture: Secondary | ICD-10-CM | POA: Diagnosis not present

## 2021-07-16 DIAGNOSIS — M79604 Pain in right leg: Secondary | ICD-10-CM | POA: Diagnosis not present

## 2021-07-16 DIAGNOSIS — M2560 Stiffness of unspecified joint, not elsewhere classified: Secondary | ICD-10-CM | POA: Diagnosis not present

## 2021-07-16 DIAGNOSIS — M6281 Muscle weakness (generalized): Secondary | ICD-10-CM | POA: Diagnosis not present

## 2021-07-16 DIAGNOSIS — M25561 Pain in right knee: Secondary | ICD-10-CM | POA: Diagnosis not present

## 2021-07-16 DIAGNOSIS — M5459 Other low back pain: Secondary | ICD-10-CM | POA: Diagnosis not present

## 2021-07-20 ENCOUNTER — Telehealth: Payer: Self-pay

## 2021-07-20 DIAGNOSIS — M2560 Stiffness of unspecified joint, not elsewhere classified: Secondary | ICD-10-CM | POA: Diagnosis not present

## 2021-07-20 DIAGNOSIS — M5459 Other low back pain: Secondary | ICD-10-CM | POA: Diagnosis not present

## 2021-07-20 DIAGNOSIS — M25561 Pain in right knee: Secondary | ICD-10-CM | POA: Diagnosis not present

## 2021-07-20 DIAGNOSIS — R293 Abnormal posture: Secondary | ICD-10-CM | POA: Diagnosis not present

## 2021-07-20 DIAGNOSIS — M6281 Muscle weakness (generalized): Secondary | ICD-10-CM | POA: Diagnosis not present

## 2021-07-20 DIAGNOSIS — M79604 Pain in right leg: Secondary | ICD-10-CM | POA: Diagnosis not present

## 2021-07-20 NOTE — Telephone Encounter (Signed)
-----   Message from [name], MD sent at 07/20/2021 12:12 PM EDT ----- ?Regarding: call ?Tell her the DEXA shows osteoporosis of the spine and osteopenia of the hip.  We will need to discuss how to treat that when she comes in for May appt.  We will be praying for all to go well with her surgery next week ? ?

## 2021-07-22 DIAGNOSIS — R293 Abnormal posture: Secondary | ICD-10-CM | POA: Diagnosis not present

## 2021-07-22 DIAGNOSIS — M5459 Other low back pain: Secondary | ICD-10-CM | POA: Diagnosis not present

## 2021-07-22 DIAGNOSIS — M2560 Stiffness of unspecified joint, not elsewhere classified: Secondary | ICD-10-CM | POA: Diagnosis not present

## 2021-07-22 DIAGNOSIS — M6281 Muscle weakness (generalized): Secondary | ICD-10-CM | POA: Diagnosis not present

## 2021-07-22 DIAGNOSIS — M25561 Pain in right knee: Secondary | ICD-10-CM | POA: Diagnosis not present

## 2021-07-22 DIAGNOSIS — M79604 Pain in right leg: Secondary | ICD-10-CM | POA: Diagnosis not present

## 2021-07-23 ENCOUNTER — Telehealth: Payer: Self-pay

## 2021-07-23 NOTE — Telephone Encounter (Signed)
Patient notified and thankful for prayers. ?

## 2021-07-23 NOTE — Telephone Encounter (Signed)
-----   Message from Belva Chimes, LPN sent at 1/76/1607 11:41 AM EDT ----- ?Regarding: FW: call ?Please contact patient . ?----- Message ----- ?From: [name], MD ?Sent: 07/20/2021  12:14 PM EDT ?To: Belva Chimes, LPN ?Subject: call                                          ? ?Tell her the DEXA shows osteoporosis of the spine and osteopenia of the hip.  We will need to discuss how to treat that when she comes in for May appt.  We will be praying for all to go well with her surgery next week ? ? ?

## 2021-07-26 ENCOUNTER — Telehealth: Payer: Self-pay | Admitting: Genetic Counselor

## 2021-07-26 ENCOUNTER — Encounter: Payer: Self-pay | Admitting: Genetic Counselor

## 2021-07-26 ENCOUNTER — Ambulatory Visit
Admission: RE | Admit: 2021-07-26 | Discharge: 2021-07-26 | Disposition: A | Discharge status: Home / Self Care | Payer: Medicare Other | Source: Ambulatory Visit | Attending: Surgery | Admitting: Surgery

## 2021-07-26 DIAGNOSIS — Z1379 Encounter for other screening for genetic and chromosomal anomalies: Secondary | ICD-10-CM | POA: Insufficient documentation

## 2021-07-26 DIAGNOSIS — R9389 Abnormal findings on diagnostic imaging of other specified body structures: Secondary | ICD-10-CM

## 2021-07-26 DIAGNOSIS — C50811 Malignant neoplasm of overlapping sites of right female breast: Secondary | ICD-10-CM | POA: Diagnosis not present

## 2021-07-26 DIAGNOSIS — Z170 Estrogen receptor positive status [ER+]: Secondary | ICD-10-CM | POA: Diagnosis not present

## 2021-07-26 DIAGNOSIS — C50911 Malignant neoplasm of unspecified site of right female breast: Secondary | ICD-10-CM | POA: Diagnosis not present

## 2021-07-26 MED ORDER — GADOBUTROL 1 MMOL/ML IV SOLN
10.0000 mL | Freq: Once | INTRAVENOUS | Status: AC | PRN
  Administered 2021-07-26: 10 mL via INTRAVENOUS

## 2021-07-26 NOTE — Telephone Encounter (Signed)
Contacted patient in attempt to disclose results of genetic testing.  LVM with contact information requesting a call back.  

## 2021-07-26 NOTE — Telephone Encounter (Signed)
Revealed negative genetic testing for breast cancer STAT panel.  Discussed that we do not know why she has breast cancer . It could be sporadic/famillial, due to a different gene that we are not testing, or maybe our current technology may not be able to pick something up.  It will be important for her to keep in contact with genetics to keep up with whether additional testing may be needed.  Results of pan-cancer panel are pending.  ? ? ? ?

## 2021-07-27 ENCOUNTER — Telehealth: Payer: Self-pay | Admitting: Genetic Counselor

## 2021-07-27 ENCOUNTER — Ambulatory Visit: Payer: Self-pay | Admitting: Genetic Counselor

## 2021-07-27 ENCOUNTER — Encounter: Payer: Self-pay | Admitting: Genetic Counselor

## 2021-07-27 DIAGNOSIS — Z1379 Encounter for other screening for genetic and chromosomal anomalies: Secondary | ICD-10-CM

## 2021-07-27 DIAGNOSIS — Z1589 Genetic susceptibility to other disease: Secondary | ICD-10-CM | POA: Insufficient documentation

## 2021-07-27 NOTE — Telephone Encounter (Signed)
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  This does not explain her personal and family history of cancer, excepting that it could explain her mothers colon cancer.  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  Her daughter and her brother should undergo genetic testing to determine if they too carry this mutation, or possibly have a second mutation in this gene.  She will talk with both of these individuals and let us know if they want further testing. ? ? ?

## 2021-07-27 NOTE — Progress Notes (Signed)
Surgical Instructions ? ? ? Your procedure is scheduled on Wednesday, May 10th, 2023. ? ? Report to [facility] Main Entrance "A" at 06:30 A.M., then check in with the Admitting office. ? Call this number if you have problems the morning of surgery: ? [phone] ? ? If you have any questions prior to your surgery date call [phone]: Open Monday-Friday 8am-4pm ? ? ? Remember: ? Do not eat after midnight the night before your surgery ? ?You may drink clear liquids until 05:30 the morning of your surgery.   ?Clear liquids allowed are: Water, Non-Citrus Juices (without pulp), Carbonated Beverages, Clear Tea, Black Coffee ONLY (NO MILK, CREAM OR POWDERED CREAMER of any kind), and Gatorade ?  ? Take these medicines the morning of surgery with A SIP OF WATER:  ? ?acetaminophen (TYLENOL)  ?amLODipine (NORVASC) ?atenolol (TENORMIN)  ?DULoxetine (CYMBALTA)  ?fexofenadine (ALLEGRA) ?fluticasone (FLONASE) ?pantoprazole (PROTONIX) ?Propylene Glycol (SYSTANE BALANCE)  ? ?If needed: ? ?cyclobenzaprine (FLEXERIL)  ?cetirizine (ZYRTEC) ?dicyclomine (BENTYL)  ?diphenhydrAMINE (BENADRYL) ?diphenoxylate-atropine (LOMOTIL) ?EPINEPHrine   ?ondansetron [facility])  ?Phenylephrine-APAP-Guaifenesin (TYLENOL SINUS SEVERE PO) ?polyvinyl alcohol (LIQUIFILM TEARS) ? ?Follow your surgeon's instructions on when to stop Aspirin.  If no instructions were given by your surgeon then you will need to call the office to get those instructions.    ? ?As of today, STOP taking any Aspirin (unless otherwise instructed by your surgeon) Aleve, Naproxen, Ibuprofen, Motrin, Advil, Goody's, BC's, all herbal medications, fish oil, and all vitamins. ? ? ? The day of surgery: ?         ?Do not wear jewelry or makeup ?Do not wear lotions, powders, perfumes, or deodorant. ?Do not shave 48 hours prior to surgery.   ?Do not bring valuables to the hospital. ?Do not wear nail polish, gel polish, artificial nails, or any other type of covering on natural nails (fingers  and toes) ?If you have artificial nails or gel coating that need to be removed by a nail salon, please have this removed prior to surgery. Artificial nails or gel coating may interfere with anesthesia's ability to adequately monitor your vital signs. ? ?[facility] is not responsible for any belongings or valuables. .  ? ?Do NOT Smoke (Tobacco/Vaping)  24 hours prior to your procedure ? ?If you use a CPAP at night, you may bring your mask for your overnight stay. ?  ?Contacts, glasses, hearing aids, dentures or partials may not be worn into surgery, please bring cases for these belongings ?  ?For patients admitted to the hospital, discharge time will be determined by your treatment team. ?  ?Patients discharged the day of surgery will not be allowed to drive home, and someone needs to stay with them for 24 hours. ? ? ?SURGICAL WAITING ROOM VISITATION ?Patients having surgery or a procedure in a hospital may have two support people. ?Children under the age of 32 must have an adult with them who is not the patient. ?They may stay in the waiting area during the procedure and may switch out with other visitors. If the patient needs to stay at the hospital during part of their recovery, the visitor guidelines for inpatient rooms apply. ? ?Please refer to the [facility] website for the visitor guidelines for Inpatients (after your surgery is over and you are in a regular room).  ? ? ?Special instructions:   ? ?Oral Hygiene is also important to reduce your risk of infection.  Remember - BRUSH YOUR TEETH THE MORNING OF SURGERY WITH YOUR REGULAR  TOOTHPASTE ? ? ?[facility]- Preparing For Surgery ? ?Before surgery, you can play an important role. Because skin is not sterile, your skin needs to be as free of germs as possible. You can reduce the number of germs on your skin by washing with CHG (chlorahexidine gluconate) Soap before surgery.  CHG is an antiseptic cleaner which kills germs and bonds with the skin to continue  killing germs even after washing.   ? ? ?Please do not use if you have an allergy to CHG or antibacterial soaps. If your skin becomes reddened/irritated stop using the CHG.  ?Do not shave (including legs and underarms) for at least 48 hours prior to first CHG shower. It is OK to shave your face. ? ?Please follow these instructions carefully. ?  ? ? Shower the NIGHT BEFORE SURGERY and the MORNING OF SURGERY with CHG Soap.  ? If you chose to wash your hair, wash your hair first as usual with your normal shampoo. After you shampoo, rinse your hair and body thoroughly to remove the shampoo.  Then [employer] and genitals (private parts) with your normal soap and rinse thoroughly to remove soap. ? ?After that Use CHG Soap as you would any other liquid soap. You can apply CHG directly to the skin and wash gently with a scrungie or a clean washcloth.  ? ?Apply the CHG Soap to your body ONLY FROM THE NECK DOWN.  Do not use on open wounds or open sores. Avoid contact with your eyes, ears, mouth and genitals (private parts). Wash Face and genitals (private parts)  with your normal soap.  ? ?Wash thoroughly, paying special attention to the area where your surgery will be performed. ? ?Thoroughly rinse your body with warm water from the neck down. ? ?DO NOT shower/wash with your normal soap after using and rinsing off the CHG Soap. ? ?Pat yourself dry with a CLEAN TOWEL. ? ?Wear CLEAN PAJAMAS to bed the night before surgery ? ?Place CLEAN SHEETS on your bed the night before your surgery ? ?DO NOT SLEEP WITH PETS. ? ? ?Day of Surgery: ? ?Take a shower with CHG soap. ?Wear Clean/Comfortable clothing the morning of surgery ?Do not apply any deodorants/lotions.   ?Remember to brush your teeth WITH YOUR REGULAR TOOTHPASTE. ? ? ? ?If you received a COVID test during your pre-op visit, it is requested that you wear a mask when out in public, stay away from anyone that may not be feeling well, and notify your surgeon if you develop  symptoms. If you have been in contact with anyone that has tested positive in the last 10 days, please notify your surgeon. ? ?  ?Please read over the following fact sheets that you were given.   ?

## 2021-07-27 NOTE — Progress Notes (Signed)
HPI: [name] was previously seen in the [place] clinic due to a family of cancer and concerns regarding a hereditary predisposition to cancer. Please refer to our prior cancer genetics clinic note for more information regarding [name]'s medical, social and family histories, and our assessment and recommendations, at the time. [name] recent genetic test results were disclosed to her, as were recommendations warranted by these results. These results and recommendations are discussed in more detail below.  ? ?FAMILY HISTORY:  ?We obtained a detailed, 4-generation family history.  Significant diagnoses are listed below: ?Family History  ?Problem Relation Age of Onset  ? Colon cancer Mother 47  ? Alzheimer's disease Mother   ? Heart attack Father   ? Cancer Maternal Aunt   ?     x2  ? Alzheimer's disease Maternal Aunt   ? Ovarian cancer Maternal Aunt   ? Prostate cancer Maternal Uncle   ? Kidney disease Maternal Uncle   ? Atrial fibrillation Maternal Uncle   ? Stroke Paternal Uncle   ? Aneurysm Paternal Grandmother   ?     brain  ? Stroke Paternal Grandfather   ? Prostate cancer Cousin   ?     paternal first cousin  ? Prostate cancer Cousin   ?     paternal first cousin  ? Esophageal cancer Cousin   ?     maternal first cousin  ? Breast cancer Cousin   ?     DCIS, maternal first cousin  ? Ovarian cancer Cousin   ?     maternal first cousin  ? Rheum arthritis Other   ? Lung disease Neg Hx   ? ? ?  ?The patient has one daughter who is cancer free.  She has one brother who is healthy and cancer free.  Her parents are both deceased. ?  ?The patient's mother had colon cancer at 6.  She had 14 brothers and sisters.  One brother had prostate cancer, one sister had ovarian cancer and one sister had breast cancer.  She has three maternal cousins one each with breast, ovarian and esophageal cancer.  The maternal grandparents are deceased. ?  ?The patient's father died of a heart attack at 39.  He  had 4 brothers and a sister.  She has two paternal cousins with prostate cancer, and one of these cousins had a daughter who had ovarian cancer in her 41's and a benign brain tumor in her 42's. ?  ?[name] is unaware of previous family history of genetic testing for hereditary cancer risks. Patient's maternal ancestors are of [place] descent, and paternal ancestors are of Caucasian descent. There is no reported Ashkenazi Jewish ancestry. There is no known consanguinity. ? ?GENETIC TEST RESULTS: At the time of [name]'s visit, we recommended she pursue genetic testing of the CancerNext-Expanded+RNAinsight panel. This test, which included sequencing and deletion/duplication analysis of the genes listed on the test report, was performed at [facility]. [name] was called today with her genetic test results. 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  Since [name] has only one pathogenic mutation in MUTYH, she is NOT affected with MYH-associated polyposis, but instead is a carrier. A copy of the test report has been scanned into Epic and is located under the Molecular Pathology section of the Results Review tab.  ? ? ? ? ?SCREENING RECOMMENDATIONS: We discussed the implications of a heterozygous MUTYH mutation for [name], and  discussed who else in the family should have genetic testing. We recommended [name] follow the most updated medical management guidelines (NCCN Guidelines v1.2018) for heterozygous MUTYH mutations; all of which are outlined below. These can be coordinated by [name]'s GI doctor or her primary provider.  ? ?Personal history of colon cancer ? Follow instructions provided by your physician based on your personal history. ? ?Do not have a personal history of colon cancer but have a parent/sibling/child with colon cancer: ?Colonoscopy every 5 years starting at age 39 or 27 years younger than the earliest age  of onset, whichever is younger. ? ?Do not have a personal history of colon cancer but do not have a parent/sibling/child with colon cancer: ?Data is uncertain on whether specialized screening is warranted.. ? ?FAMILY MEMBERS: Since we now know the mutation in [name], we can test at-risk relatives to determine whether or not they have inherited the mutation and are at increased risk for cancer. We will be happy to meet with any of the family members or refer them to a genetic counselor in their local area. To locate genetic counselors in other cities, individuals can visit the website of the [employer] ([employer]ist[email or web address]) and Secretary/administrator for a Social worker by zip code.  ? ?[name]' daughter and brother have a 50% chance to have inherited this mutation. We recommend they have genetic testing for not only this same mutation, but other mutations in the gene.  MUTYH associated polyposis is a recessive condition, so identifying the presence of this, and any other mutation would allow them to also take advantage of risk-reducing measures. ? ?We strongly encouraged [name] to remain in contact with [place] in cancer genetics on an annual basis so we can update [name]'s personal and family histories, and inform her of advances in cancer genetics that may be of benefit for the entire family. [name] knows she is also welcome to call with any questions or concerns, at any time.  ? ?[name], MS, [facility]  ?Licensed, Insurance risk surveyor ?[name] ?phone: [phone] ? ? ? ?

## 2021-07-28 ENCOUNTER — Encounter (HOSPITAL_COMMUNITY): Payer: Self-pay

## 2021-07-28 ENCOUNTER — Other Ambulatory Visit: Payer: Self-pay

## 2021-07-28 ENCOUNTER — Encounter (HOSPITAL_COMMUNITY)
Admission: RE | Admit: 2021-07-28 | Discharge: 2021-07-28 | Disposition: A | Discharge status: Home / Self Care | Payer: Medicare Other | Source: Ambulatory Visit | Attending: Surgery | Admitting: Surgery

## 2021-07-28 VITALS — BP 127/80 | HR 58 | Temp 98.0°F | Resp 18 | Ht 63.0 in | Wt 208.0 lb

## 2021-07-28 DIAGNOSIS — Z01818 Encounter for other preprocedural examination: Secondary | ICD-10-CM | POA: Insufficient documentation

## 2021-07-28 LAB — CBC
HCT: 42.1 % (ref 36.0–46.0)
Hemoglobin: 13.6 g/dL (ref 12.0–15.0)
MCH: 28.9 pg (ref 26.0–34.0)
MCHC: 32.3 g/dL (ref 30.0–36.0)
MCV: 89.4 fL (ref 80.0–100.0)
Platelets: 266 10*3/uL (ref 150–400)
RBC: 4.71 MIL/uL (ref 3.87–5.11)
RDW: 12.7 % (ref 11.5–15.5)
WBC: 6.6 10*3/uL (ref 4.0–10.5)
nRBC: 0 % (ref 0.0–0.2)

## 2021-07-28 LAB — BASIC METABOLIC PANEL
Anion gap: 8 (ref 5–15)
BUN: 15 mg/dL (ref 8–23)
CO2: 27 mmol/L (ref 22–32)
Calcium: 9.7 mg/dL (ref 8.9–10.3)
Chloride: 105 mmol/L (ref 98–111)
Creatinine, Ser: 0.95 mg/dL (ref 0.44–1.00)
GFR, Estimated: >60 mL/min (ref >60)
Glucose, Bld: 108 mg/dL — ABNORMAL HIGH (ref 70–99)
Potassium: 4.3 mmol/L (ref 3.5–5.1)
Sodium: 140 mmol/L (ref 135–145)

## 2021-07-28 NOTE — Progress Notes (Signed)
PCP:  [name], MD ?Cardiologist:  denies ? ?EKG:  07/28/21 ?CXR:  na ?ECHO:  09/02/16 ?Stress Test:  denies ?Cardiac Cath:  denies ? ?Fasting Blood Sugar-  na ?Checks Blood Sugar__na_ times a day ? ?OSA/CPAP: Yes, wears cpap nightly ? ?ASA: Patient to call office today for instructions ?Blood Thinner: No ? ?Covid test not needed ? ?Anesthesia Review: No ? ?Patient denies shortness of breath, fever, cough, and chest pain at PAT appointment. ? ?Patient verbalized understanding of instructions provided today at the PAT appointment.  Patient asked to review instructions at home and day of surgery.   ?

## 2021-07-29 ENCOUNTER — Ambulatory Visit: Payer: Medicare Other | Admitting: Oncology

## 2021-07-29 DIAGNOSIS — R2689 Other abnormalities of gait and mobility: Secondary | ICD-10-CM | POA: Diagnosis not present

## 2021-07-29 DIAGNOSIS — M25561 Pain in right knee: Secondary | ICD-10-CM | POA: Diagnosis not present

## 2021-07-29 DIAGNOSIS — M79604 Pain in right leg: Secondary | ICD-10-CM | POA: Diagnosis not present

## 2021-07-29 DIAGNOSIS — R293 Abnormal posture: Secondary | ICD-10-CM | POA: Diagnosis not present

## 2021-07-29 DIAGNOSIS — M6281 Muscle weakness (generalized): Secondary | ICD-10-CM | POA: Diagnosis not present

## 2021-07-29 DIAGNOSIS — M2560 Stiffness of unspecified joint, not elsewhere classified: Secondary | ICD-10-CM | POA: Diagnosis not present

## 2021-07-29 DIAGNOSIS — M5459 Other low back pain: Secondary | ICD-10-CM | POA: Diagnosis not present

## 2021-08-02 DIAGNOSIS — R293 Abnormal posture: Secondary | ICD-10-CM | POA: Diagnosis not present

## 2021-08-02 DIAGNOSIS — M2560 Stiffness of unspecified joint, not elsewhere classified: Secondary | ICD-10-CM | POA: Diagnosis not present

## 2021-08-02 DIAGNOSIS — M5459 Other low back pain: Secondary | ICD-10-CM | POA: Diagnosis not present

## 2021-08-02 DIAGNOSIS — M25561 Pain in right knee: Secondary | ICD-10-CM | POA: Diagnosis not present

## 2021-08-02 DIAGNOSIS — M79604 Pain in right leg: Secondary | ICD-10-CM | POA: Diagnosis not present

## 2021-08-02 DIAGNOSIS — M6281 Muscle weakness (generalized): Secondary | ICD-10-CM | POA: Diagnosis not present

## 2021-08-03 ENCOUNTER — Ambulatory Visit: Payer: Self-pay | Admitting: Surgery

## 2021-08-03 DIAGNOSIS — C50811 Malignant neoplasm of overlapping sites of right female breast: Secondary | ICD-10-CM | POA: Diagnosis not present

## 2021-08-04 ENCOUNTER — Ambulatory Visit (HOSPITAL_COMMUNITY): Payer: Medicare Other | Admitting: Certified Registered Nurse Anesthetist

## 2021-08-04 ENCOUNTER — Ambulatory Visit (HOSPITAL_BASED_OUTPATIENT_CLINIC_OR_DEPARTMENT_OTHER): Payer: Medicare Other | Admitting: Certified Registered Nurse Anesthetist

## 2021-08-04 ENCOUNTER — Other Ambulatory Visit: Payer: Self-pay

## 2021-08-04 ENCOUNTER — Encounter (HOSPITAL_COMMUNITY)
Admission: RE | Disposition: A | Discharge status: Home / Self Care | Payer: Self-pay | Source: Home / Self Care | Attending: Surgery

## 2021-08-04 ENCOUNTER — Ambulatory Visit (HOSPITAL_COMMUNITY)
Admission: RE | Admit: 2021-08-04 | Discharge: 2021-08-04 | Disposition: A | Discharge status: Home / Self Care | Payer: Medicare Other | Attending: Surgery | Admitting: Surgery

## 2021-08-04 DIAGNOSIS — Z803 Family history of malignant neoplasm of breast: Secondary | ICD-10-CM | POA: Insufficient documentation

## 2021-08-04 DIAGNOSIS — C50411 Malignant neoplasm of upper-outer quadrant of right female breast: Secondary | ICD-10-CM | POA: Insufficient documentation

## 2021-08-04 DIAGNOSIS — C50911 Malignant neoplasm of unspecified site of right female breast: Secondary | ICD-10-CM | POA: Diagnosis not present

## 2021-08-04 DIAGNOSIS — I10 Essential (primary) hypertension: Secondary | ICD-10-CM | POA: Insufficient documentation

## 2021-08-04 DIAGNOSIS — G4730 Sleep apnea, unspecified: Secondary | ICD-10-CM | POA: Insufficient documentation

## 2021-08-04 DIAGNOSIS — Z8041 Family history of malignant neoplasm of ovary: Secondary | ICD-10-CM | POA: Diagnosis not present

## 2021-08-04 DIAGNOSIS — N6091 Unspecified benign mammary dysplasia of right breast: Secondary | ICD-10-CM | POA: Diagnosis not present

## 2021-08-04 DIAGNOSIS — Z170 Estrogen receptor positive status [ER+]: Secondary | ICD-10-CM | POA: Insufficient documentation

## 2021-08-04 DIAGNOSIS — G8918 Other acute postprocedural pain: Secondary | ICD-10-CM | POA: Diagnosis not present

## 2021-08-04 HISTORY — PX: BREAST LUMPECTOMY WITH RADIOACTIVE SEED LOCALIZATION: SHX6424

## 2021-08-04 HISTORY — PX: SENTINEL NODE BIOPSY: SHX6608

## 2021-08-04 SURGERY — BREAST LUMPECTOMY WITH RADIOACTIVE SEED LOCALIZATION
Anesthesia: General | Site: Breast | Laterality: Right

## 2021-08-04 MED ORDER — LACTATED RINGERS IV SOLN: INTRAVENOUS | Status: DC

## 2021-08-04 MED ORDER — PHENYLEPHRINE HCL-NACL 20-0.9 MG/250ML-% IV SOLN
INTRAVENOUS | Status: DC | PRN
  Administered 2021-08-04: 50 ug/min via INTRAVENOUS

## 2021-08-04 MED ORDER — EPHEDRINE SULFATE-NACL 50-0.9 MG/10ML-% IV SOSY
PREFILLED_SYRINGE | INTRAVENOUS | Status: DC | PRN
  Administered 2021-08-04: 10 mg via INTRAVENOUS
  Administered 2021-08-04: 5 mg via INTRAVENOUS
  Administered 2021-08-04: 10 mg via INTRAVENOUS

## 2021-08-04 MED ORDER — DEXAMETHASONE SODIUM PHOSPHATE 10 MG/ML IJ SOLN
INTRAMUSCULAR | Status: DC | PRN
  Administered 2021-08-04: 5 mg via INTRAVENOUS

## 2021-08-04 MED ORDER — CHLORHEXIDINE GLUCONATE CLOTH 2 % EX PADS: 6.0000 | MEDICATED_PAD | Freq: Once | CUTANEOUS | Status: DC

## 2021-08-04 MED ORDER — PHENYLEPHRINE 80 MCG/ML (10ML) SYRINGE FOR IV PUSH (FOR BLOOD PRESSURE SUPPORT)
PREFILLED_SYRINGE | INTRAVENOUS | Status: DC | PRN
  Administered 2021-08-04: 80 ug via INTRAVENOUS
  Administered 2021-08-04 (×3): 160 ug via INTRAVENOUS
  Administered 2021-08-04: 80 ug via INTRAVENOUS
  Administered 2021-08-04: 160 ug via INTRAVENOUS

## 2021-08-04 MED ORDER — CHLORHEXIDINE GLUCONATE 0.12 % MT SOLN
15.0000 mL | Freq: Once | OROMUCOSAL | Status: AC
  Administered 2021-08-04: 15 mL via OROMUCOSAL
  Filled 2021-08-04: qty 15

## 2021-08-04 MED ORDER — MIDAZOLAM HCL 2 MG/2ML IJ SOLN
INTRAMUSCULAR | Status: AC
  Filled 2021-08-04: qty 2

## 2021-08-04 MED ORDER — CLINDAMYCIN PHOSPHATE 900 MG/50ML IV SOLN
900.0000 mg | INTRAVENOUS | Status: AC
  Administered 2021-08-04: 900 mg via INTRAVENOUS
  Filled 2021-08-04: qty 50

## 2021-08-04 MED ORDER — BUPIVACAINE-EPINEPHRINE (PF) 0.25% -1:200000 IJ SOLN
INTRAMUSCULAR | Status: AC
  Filled 2021-08-04: qty 30

## 2021-08-04 MED ORDER — OXYCODONE HCL 5 MG PO TABS: 5.0000 mg | ORAL_TABLET | Freq: Four times a day (QID) | ORAL | 0 refills | Status: DC | PRN

## 2021-08-04 MED ORDER — BUPIVACAINE-EPINEPHRINE 0.25% -1:200000 IJ SOLN
INTRAMUSCULAR | Status: DC | PRN
  Administered 2021-08-04: 18 mL
  Administered 2021-08-04: 6 mL

## 2021-08-04 MED ORDER — FENTANYL CITRATE (PF) 250 MCG/5ML IJ SOLN
INTRAMUSCULAR | Status: AC
  Filled 2021-08-04: qty 5

## 2021-08-04 MED ORDER — DEXAMETHASONE SODIUM PHOSPHATE 10 MG/ML IJ SOLN
INTRAMUSCULAR | Status: AC
  Filled 2021-08-04: qty 1

## 2021-08-04 MED ORDER — LIDOCAINE 2% (20 MG/ML) 5 ML SYRINGE
INTRAMUSCULAR | Status: DC | PRN
  Administered 2021-08-04: 60 mg via INTRAVENOUS

## 2021-08-04 MED ORDER — OXYCODONE HCL 5 MG PO TABS: 5.0000 mg | ORAL_TABLET | Freq: Once | ORAL | Status: DC | PRN

## 2021-08-04 MED ORDER — FENTANYL CITRATE (PF) 100 MCG/2ML IJ SOLN: 25.0000 ug | INTRAMUSCULAR | Status: DC | PRN

## 2021-08-04 MED ORDER — ONDANSETRON HCL 4 MG/2ML IJ SOLN
INTRAMUSCULAR | Status: DC | PRN
  Administered 2021-08-04: 4 mg via INTRAVENOUS

## 2021-08-04 MED ORDER — HEMOSTATIC AGENTS (NO CHARGE) OPTIME
TOPICAL | Status: DC | PRN
  Administered 2021-08-04: 1 via TOPICAL

## 2021-08-04 MED ORDER — FENTANYL CITRATE (PF) 250 MCG/5ML IJ SOLN
INTRAMUSCULAR | Status: DC | PRN
  Administered 2021-08-04 (×4): 50 ug via INTRAVENOUS
  Administered 2021-08-04: 25 ug via INTRAVENOUS

## 2021-08-04 MED ORDER — ORAL CARE MOUTH RINSE: 15.0000 mL | Freq: Once | OROMUCOSAL | Status: AC

## 2021-08-04 MED ORDER — LIDOCAINE 2% (20 MG/ML) 5 ML SYRINGE
INTRAMUSCULAR | Status: AC
  Filled 2021-08-04: qty 5

## 2021-08-04 MED ORDER — MIDAZOLAM HCL 2 MG/2ML IJ SOLN
INTRAMUSCULAR | Status: DC | PRN
  Administered 2021-08-04: 1 mg via INTRAVENOUS

## 2021-08-04 MED ORDER — ONDANSETRON HCL 4 MG/2ML IJ SOLN: 4.0000 mg | Freq: Four times a day (QID) | INTRAMUSCULAR | Status: DC | PRN

## 2021-08-04 MED ORDER — GLYCOPYRROLATE PF 0.2 MG/ML IJ SOSY
PREFILLED_SYRINGE | INTRAMUSCULAR | Status: DC | PRN
  Administered 2021-08-04: .1 mg via INTRAVENOUS

## 2021-08-04 MED ORDER — PROPOFOL 10 MG/ML IV BOLUS
INTRAVENOUS | Status: AC
  Filled 2021-08-04: qty 20

## 2021-08-04 MED ORDER — ONDANSETRON HCL 4 MG/2ML IJ SOLN
INTRAMUSCULAR | Status: AC
  Filled 2021-08-04: qty 2

## 2021-08-04 MED ORDER — OXYCODONE HCL 5 MG/5ML PO SOLN: 5.0000 mg | Freq: Once | ORAL | Status: DC | PRN

## 2021-08-04 MED ORDER — ACETAMINOPHEN 500 MG PO TABS
1000.0000 mg | ORAL_TABLET | ORAL | Status: AC
  Administered 2021-08-04: 1000 mg via ORAL
  Filled 2021-08-04: qty 2

## 2021-08-04 MED ORDER — IBUPROFEN 800 MG PO TABS: 800.0000 mg | ORAL_TABLET | Freq: Three times a day (TID) | ORAL | 0 refills | Status: DC | PRN

## 2021-08-04 MED ORDER — ROPIVACAINE HCL 5 MG/ML IJ SOLN
INTRAMUSCULAR | Status: DC | PRN
  Administered 2021-08-04: 25 mL via PERINEURAL

## 2021-08-04 MED ORDER — MAGTRACE LYMPHATIC TRACER
INTRAMUSCULAR | Status: DC | PRN
  Administered 2021-08-04: 2 mL via INTRAMUSCULAR

## 2021-08-04 MED ORDER — GABAPENTIN 300 MG PO CAPS
300.0000 mg | ORAL_CAPSULE | ORAL | Status: AC
  Administered 2021-08-04: 300 mg via ORAL
  Filled 2021-08-04: qty 1

## 2021-08-04 MED ORDER — PROPOFOL 10 MG/ML IV BOLUS
INTRAVENOUS | Status: DC | PRN
  Administered 2021-08-04: 140 mg via INTRAVENOUS
  Administered 2021-08-04 (×2): 30 mg via INTRAVENOUS

## 2021-08-04 MED ORDER — EPHEDRINE 5 MG/ML INJ
INTRAVENOUS | Status: AC
  Filled 2021-08-04: qty 5

## 2021-08-04 SURGICAL SUPPLY — 34 items
APPLIER CLIP 9.375 MED OPEN (MISCELLANEOUS) ×3
BAG COUNTER SPONGE SURGICOUNT (BAG) ×3 IMPLANT
CANISTER SUCT 3000ML PPV (MISCELLANEOUS) ×1 IMPLANT
CHLORAPREP W/TINT 26 (MISCELLANEOUS) ×3 IMPLANT
CLIP APPLIE 9.375 MED OPEN (MISCELLANEOUS) IMPLANT
COVER PROBE W GEL 5X96 (DRAPES) ×5 IMPLANT
COVER SURGICAL LIGHT HANDLE (MISCELLANEOUS) ×3 IMPLANT
DERMABOND ADVANCED (GAUZE/BANDAGES/DRESSINGS) ×1
DERMABOND ADVANCED .7 DNX12 (GAUZE/BANDAGES/DRESSINGS) ×2 IMPLANT
DEVICE DUBIN SPECIMEN MAMMOGRA (MISCELLANEOUS) ×3 IMPLANT
DRAPE CHEST BREAST 15X10 FENES (DRAPES) ×3 IMPLANT
ELECT CAUTERY BLADE 6.4 (BLADE) ×3 IMPLANT
ELECT REM PT RETURN 9FT ADLT (ELECTROSURGICAL) ×3
ELECTRODE REM PT RTRN 9FT ADLT (ELECTROSURGICAL) ×2 IMPLANT
GLOVE BIO SURGEON STRL SZ8 (GLOVE) ×3 IMPLANT
GLOVE BIOGEL PI IND STRL 8 (GLOVE) ×2 IMPLANT
GLOVE BIOGEL PI INDICATOR 8 (GLOVE) ×1
GOWN STRL REUS W/ TWL LRG LVL3 (GOWN DISPOSABLE) ×2 IMPLANT
GOWN STRL REUS W/ TWL XL LVL3 (GOWN DISPOSABLE) ×2 IMPLANT
GOWN STRL REUS W/TWL LRG LVL3 (GOWN DISPOSABLE) ×3
GOWN STRL REUS W/TWL XL LVL3 (GOWN DISPOSABLE) ×3
KIT BASIN OR (CUSTOM PROCEDURE TRAY) ×3 IMPLANT
KIT MARKER MARGIN INK (KITS) ×1 IMPLANT
NDL HYPO 25GX1X1/2 BEV (NEEDLE) IMPLANT
NEEDLE HYPO 25GX1X1/2 BEV (NEEDLE) ×3 IMPLANT
NS IRRIG 1000ML POUR BTL (IV SOLUTION) ×1 IMPLANT
PACK GENERAL/GYN (CUSTOM PROCEDURE TRAY) ×3 IMPLANT
SUT MNCRL AB 4-0 PS2 18 (SUTURE) ×4 IMPLANT
SUT VIC AB 3-0 SH 27 (SUTURE) ×3
SUT VIC AB 3-0 SH 27X BRD (SUTURE) IMPLANT
SUT VIC AB 3-0 SH 8-18 (SUTURE) ×1 IMPLANT
SYR CONTROL 10ML LL (SYRINGE) ×1 IMPLANT
TOWEL GREEN STERILE FF (TOWEL DISPOSABLE) ×1 IMPLANT
TRACER MAGTRACE VIAL (MISCELLANEOUS) ×1 IMPLANT

## 2021-08-04 NOTE — Anesthesia Procedure Notes (Signed)
Anesthesia Regional Block: Pectoralis block  ? ?Pre-Anesthetic Checklist: , timeout performed,  Correct Patient, Correct Site, Correct Laterality,  Correct Procedure, Correct Position, site marked,  Risks and benefits discussed,  Surgical consent,  Pre-op evaluation,  At surgeon's request and post-op pain management ? ?Laterality: Right ? ?Prep: chloraprep     ?  ?Needles:  ?Injection technique: Single-shot ? ?Needle Type: Echogenic Needle   ? ? ?Needle Length: 9cm  ?Needle Gauge: 21  ? ? ? ?Additional Needles: ? ? ?Narrative:  ?Start time: 08/04/2021 7:57 AM ?End time: 08/04/2021 8:07 AM ?Injection made incrementally with aspirations every 5 mL. ? ?Performed by: Personally  ?Anesthesiologist: Albertha Ghee, MD ? ?Additional Notes: ?Pt tolerated the procedure well. ? ? ? ? ?

## 2021-08-04 NOTE — Anesthesia Preprocedure Evaluation (Signed)
Anesthesia Evaluation  ?Patient identified by MRN, date of birth, ID band ?Patient awake ? ? ? ?Reviewed: ?Allergy & Precautions, H&P , NPO status , Patient's Chart, lab work & pertinent test results ? ?Airway ?Mallampati: II ? ? ?Neck ROM: full ? ? ? Dental ?  ?Pulmonary ?sleep apnea ,  ?  ?breath sounds clear to auscultation ? ? ? ? ? ? Cardiovascular ?hypertension,  ?Rhythm:regular Rate:Normal ? ? ?  ?Neuro/Psych ?PSYCHIATRIC DISORDERS Anxiety  Neuromuscular disease   ? GI/Hepatic ?GERD  ,  ?Endo/Other  ? ? Renal/GU ?  ? ?  ?Musculoskeletal ? ?(+) Fibromyalgia - ? Abdominal ?  ?Peds ? Hematology ?  ?Anesthesia Other Findings ? ? Reproductive/Obstetrics ? ?  ? ? ? ? ? ? ? ? ? ? ? ? ? ?  ?  ? ? ? ? ? ? ? ? ?Anesthesia Physical ?Anesthesia Plan ? ?ASA: 2 ? ?Anesthesia Plan: General  ? ?Post-op Pain Management: Regional block*  ? ?Induction: Intravenous ? ?PONV Risk Score and Plan: 3 and Ondansetron, Dexamethasone, Midazolam and Treatment may vary due to age or medical condition ? ?Airway Management Planned: LMA ? ?Additional Equipment:  ? ?Intra-op Plan:  ? ?Post-operative Plan: Extubation in OR ? ?Informed Consent: I have reviewed the patients History and Physical, chart, labs and discussed the procedure including the risks, benefits and alternatives for the proposed anesthesia with the patient or authorized representative who has indicated his/her understanding and acceptance.  ? ? ? ?Dental advisory given ? ?Plan Discussed with: CRNA, Anesthesiologist and Surgeon ? ?Anesthesia Plan Comments:   ? ? ? ? ? ? ?Anesthesia Quick Evaluation ? ?

## 2021-08-04 NOTE — Anesthesia Procedure Notes (Signed)
Procedure Name: LMA Insertion ?Date/Time: 08/04/2021 8:22 AM ?Performed by: Janace Litten, CRNA ?Pre-anesthesia Checklist: Patient identified, Emergency Drugs available, Suction available and Patient being monitored ?Patient Re-evaluated:Patient Re-evaluated prior to induction ?Oxygen Delivery Method: Circle System Utilized ?Preoxygenation: Pre-oxygenation with 100% oxygen ?Induction Type: IV induction ?Ventilation: Mask ventilation without difficulty ?LMA: LMA inserted ?LMA Size: 3.0 ?Number of attempts: 1 ?Placement Confirmation: positive ETCO2 ?Tube secured with: Tape ?Dental Injury: Teeth and Oropharynx as per pre-operative assessment  ? ? ? ? ?

## 2021-08-04 NOTE — Discharge Instructions (Signed)
[facility] Office Phone Number [phone]  BREAST BIOPSY/ PARTIAL MASTECTOMY: POST OP INSTRUCTIONS  Always review your discharge instruction sheet given to you by the facility where your surgery was performed.  IF YOU HAVE DISABILITY OR FAMILY LEAVE FORMS, YOU MUST BRING THEM TO THE OFFICE FOR PROCESSING.  DO NOT GIVE THEM TO YOUR DOCTOR.  A prescription for pain medication may be given to you upon discharge.  Take your pain medication as prescribed, if needed.  If narcotic pain medicine is not needed, then you may take acetaminophen (Tylenol) or ibuprofen (Advil) as needed. Take your usually prescribed medications unless otherwise directed If you need a refill on your pain medication, please contact your pharmacy.  They will contact our office to request authorization.  Prescriptions will not be filled after 5pm or on week-ends. You should eat very light the first 24 hours after surgery, such as soup, crackers, pudding, etc.  Resume your normal diet the day after surgery. Most patients will experience some swelling and bruising in the breast.  Ice packs and a good support bra will help.  Swelling and bruising can take several days to resolve.  It is common to experience some constipation if taking pain medication after surgery.  Increasing fluid intake and taking a stool softener will usually help or prevent this problem from occurring.  A mild laxative (Milk of Magnesia or Miralax) should be taken according to package directions if there are no bowel movements after 48 hours. Unless discharge instructions indicate otherwise, you may remove your bandages 24-48 hours after surgery, and you may shower at that time.  You may have steri-strips (small skin tapes) in place directly over the incision.  These strips should be left on the skin for 7-10 days.  If your surgeon used skin glue on the incision, you may shower in 24 hours.  The glue will flake off over the next 2-3 weeks.  Any  sutures or staples will be removed at the office during your follow-up visit. ACTIVITIES:  You may resume regular daily activities (gradually increasing) beginning the next day.  Wearing a good support bra or sports bra minimizes pain and swelling.  You may have sexual intercourse when it is comfortable. You may drive when you no longer are taking prescription pain medication, you can comfortably wear a seatbelt, and you can safely maneuver your car and apply brakes. RETURN TO WORK:  ______________________________________________________________________________________ You should see your doctor in the office for a follow-up appointment approximately two weeks after your surgery.  Your doctor's nurse will typically make your follow-up appointment when she calls you with your pathology report.  Expect your pathology report 2-3 business days after your surgery.  You may call to check if you do not hear from us after three days. OTHER INSTRUCTIONS: _______________________________________________________________________________________________ _____________________________________________________________________________________________________________________________________ _____________________________________________________________________________________________________________________________________ _____________________________________________________________________________________________________________________________________  WHEN TO CALL YOUR DOCTOR: Fever over 101.0 Nausea and/or vomiting. Extreme swelling or bruising. Continued bleeding from incision. Increased pain, redness, or drainage from the incision.  The clinic staff is available to answer your questions during regular business hours.  Please don't hesitate to call and ask to speak to one of the nurses for clinical concerns.  If you have a medical emergency, go to the nearest emergency room or call 911.  A surgeon from [facility] is always on call at the hospital.  For further questions, please visit [email or web address]   

## 2021-08-04 NOTE — Op Note (Signed)
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 ? ? ? ?Postoperative diagnosis: Same  ? ? ? ? ?Procedure: right breast seed localized lumpectomy with right axillary sentinel lymph node mag trace  ?Surgeon: Erroll Luna M.D.  ? ? ?Anesthesia: LMA with pectoral block anesthesia  and 0.25% marcaine with epinephrine  ? ?EBL: 20 cc  ? ?Specimen: right  breast mass with 2 clips and 2 seeds and seed to pathology and one axillary sentinel  ? ? ? ?Drains: None  ? ? ?Indications for procedure: Patient presents for treatment of her right breast cancer. She has opted for breast conservation after lengthy discussion of treatment options to include breast conservation surgery and mastectomy and reconstruction. Risks, benefits and alternatives discussed with the patient.The procedure has been discussed with the patient. Alternatives to surgery have been discussed with the patient. Risks of surgery include bleeding, Infection, Seroma formation, death, and the need for further surgery. The patient understands and wishes to proceed.Sentinel lymph node mapping and dissection has been discussed with the patient. Risk of bleeding, Infection, Seroma formation, Additional procedures,, Shoulder weakness , Shoulder stiffness, Nerve and blood vessel injury and reaction to the mapping dyes have been discussed. Alternatives to surgery have been discussed with the patient. The patient agrees to proceed.  ? ? ? ? ? ? ? ?Description of procedure: Patient underwent placement of right breast seed in radiology earlier in the week. She presents to the holding area and questions are answered. Patient taken back to operating room and placed supine on the operating room table. Patient received 2 g of Ancef. After induction of LMA anesthesia right breast was prepped and draped in a sterile fashion and 2 cc of mag trace  was injected in a subareolar position. Of note, patient had pectoral block by anesthesia prior to this.  Neoprobe was used to identify the radioactive seen in the right  upper-outer quadrant. Curvilinear incision made  along the NAC and dissection was carried around to excise all tissue around both the clip sand seeds. Radiograph showed the mass with gross negative margins. Both seeds  and clips were in the specimen. Specimen sent to pathology.  ? ? ?Mag trace probe used next. Hot spot identify the right axilla. Incision made in the inferior axillary hairline and dissection carried into the axilla. Single node identified with signal. This was excised. . Background counts approached baseline. Wound was irrigated , Arista placed and closed with 3-0 Vicryl and 4-0 Monocryl. Lumpectomy site closed in a similar fashion. Dermabond applied. All final counts sponge, needle instruments found to be correct at this point. Patient awoke, taken to recovery in satisfactory condition.   ?

## 2021-08-04 NOTE — H&P (Signed)
Chief Complaint: Breast Cancer ? ? ?History of Present Illness: ?Doris Reyes is a 71 y.o. female who is seen today as an office consultation for evaluation of Breast Cancer ?.  ? ?Patient sent for evaluation of right breast mammographic abnormality by the [facility] mammogram center. She was noted to have an 8 mm distortion right breast upper outer quadrant/central right breast at 12:00. Core biopsy showed invasive ductal carcinoma, ER positive PR positive HER2/neu equivocal with AKI of 5%. Family history of ovarian cancer and breast cancer on her maternal side. No history of breast pain, nipple discharge or mass in either breast. This was her first breast biopsy. ? ?Review of Systems: ?A complete review of systems was obtained from the patient. I have reviewed this information and discussed as appropriate with the patient. See HPI as well for other ROS. ? ?Review of Systems  ?All other systems reviewed and are negative. ? ? ?Medical History: ?No past medical history on file. ? ?There is no problem list on file for this patient. ? ?No past surgical history on file.  ? ?Allergies  ?Allergen Reactions  ? Cefuroxime Axetil Diarrhea and Other (See Comments)  ? Tramadol Hcl Other (See Comments) and Vomiting  ? ?No current outpatient medications on file prior to visit.  ? ?No current facility-administered medications on file prior to visit.  ? ?No family history on file.  ? ?Social History  ? ?Tobacco Use  ?Smoking Status Not on file  ?Smokeless Tobacco Not on file  ? ? ?Social History  ? ?Socioeconomic History  ? Marital status: Married  ? ?Objective:  ? ?Vitals:  ?06/14/21 1015  ?BP: (!) 130/90  ?Pulse: 72  ?Weight: 96.3 kg (212 lb 6.4 oz)  ?Height: 162.6 cm (5' 4" )  ? ?Body mass index is 36.46 kg/m?. ? ?Physical Exam ?Constitutional:  ?Appearance: Normal appearance.  ?Eyes:  ?Pupils: Pupils are equal, round, and reactive to light.  ?Cardiovascular:  ?Rate and Rhythm: Normal rate and regular rhythm.  ?Pulmonary:  ?Effort:  Pulmonary effort is normal.  ?Breath sounds: No stridor.  ?Chest:  ?Breasts: ?Right: Swelling and tenderness present. No mass, nipple discharge or skin change.  ?Left: No swelling, bleeding, mass, nipple discharge, skin change or tenderness.  ? ?Comments: Bruising right breast noted ?Musculoskeletal:  ?General: Normal range of motion.  ?Cervical back: Normal range of motion.  ?Lymphadenopathy:  ?Upper Body:  ?Right upper body: No supraclavicular or axillary adenopathy.  ?Left upper body: No supraclavicular or axillary adenopathy.  ?Skin: ?General: Skin is warm.  ?Neurological:  ?General: No focal deficit present.  ?Mental Status: She is alert.  ?Psychiatric:  ?Mood and Affect: Mood normal.  ? ? ? ?Labs, Imaging and Diagnostic Testing: ?PROGNOSTIC INDICATORS ?Results: ?IMMUNOHISTOCHEMICAL AND MORPHOMETRIC ANALYSIS PERFORMED MANUALLY ?The tumor cells are EQUIVOCAL for Her2 (2+). Her2 by FISH will be performed and the results reported separately. ?Estrogen Receptor: 95%, POSITIVE, MODERATE STAINING INTENSITY ?Progesterone Receptor: 95%, POSITIVE, STRONG STAINING INTENSITY ?Proliferation Marker Ki67: <5% ?REFERENCE RANGE ESTROGEN RECEPTOR ?NEGATIVE 0% ?POSITIVE =>1% ?REFERENCE RANGE PROGESTERONE RECEPTOR ?NEGATIVE 0% ?POSITIVE =>1% ?All controls stained appropriately ?Thressa Sheller MD ?Pathologist, Electronic Signature ?( Signed 06/10/2021) ?FINAL DIAGNOSIS ?Diagnosis ?Breast, right, needle core biopsy, right breast mass 0.9 cm @ 12:00 2cmfn ?- INVASIVE MAMMARY CARCINOMA, GRADE 2. ?- MAMMARY CARCINOMA IN SITU. ?- SEE MICROSCOPIC DESCRIPTION. ? ?Mammo: 8 mm mass 12 o'clock right breast 2 cm from NAC  ? ?Assessment and Plan:  ? ?STAGE 1 RIGHT BREAST CANCER overlapping sites  ?ER/PR pos  ? ?  Discussed treatment options to include breast conserving surgery versus mastectomy with or without reconstruction. Reviewed her pathology and discussed treatment options. Discussed lumpectomy with her and breast conserving surgery given  the small tumor size. She would like to proceed with this. We discussed the pros and cons of sentinel lymph node mapping given her age and reviewed the data where this could be admitted with the likelihood of lymph node involvement being well under 8%. Discussed the role of radiation therapy as well as antiestrogen therapy. She would like to see medical oncology and radiation oncology in [place] and we will make arrangements for that. Plan will be for right breast seed localized lumpectomy as an outpatient. I reviewed long-term expectations, local regional recurrence, metastatic potential and the need for the treatments and/or procedures and also discussed the lymph node mapping issue with respect to her condition. She is fine omitting it after discussion of the above.The procedure has been discussed with the patient. Alternatives to surgery have been discussed with the patient. Risks of surgery include bleeding, Infection, Seroma formation, death, and the need for further surgery. The patient understands and wishes to proceed. ? ?No follow-ups on file. ? ?Kennieth Francois, MD   ?

## 2021-08-04 NOTE — Transfer of Care (Signed)
Immediate Anesthesia Transfer of Care Note ? ?Patient: Doris Reyes ? ?Procedure(s) Performed: RIGHT BREAST LUMPECTOMY WITH RADIOACTIVE SEED LOCALIZATION (Right: Breast) ?SENTINEL NODE BIOPSY ? ?Patient Location: PACU ? ?Anesthesia Type:GA combined with regional for post-op pain ? ?Level of Consciousness: drowsy, patient cooperative and responds to stimulation ? ?Airway & Oxygen Therapy: Patient Spontanous Breathing and Patient connected to nasal cannula oxygen ? ?Post-op Assessment: Report given to RN and Post -op Vital signs reviewed and stable ? ?Post vital signs: Reviewed and stable ? ?Last Vitals:  ?Vitals Value Taken Time  ?BP 93/54 08/04/21 1002  ?Temp    ?Pulse 63 08/04/21 1003  ?Resp 22 08/04/21 1003  ?SpO2 96 % 08/04/21 1003  ?Vitals shown include unvalidated device data. ? ?Last Pain:  ?Vitals:  ? 08/04/21 0710  ?TempSrc:   ?PainSc: 0-No pain  ?   ? ?  ? ?Complications: No notable events documented. ?

## 2021-08-04 NOTE — Interval H&P Note (Signed)
History and Physical Interval Note: ? ?08/04/2021 ?7:17 AM ? ?Doris Reyes  has presented today for surgery, with the diagnosis of RIGHT BREAST CANCER.  The various methods of treatment have been discussed with the patient and family. After consideration of risks, benefits and other options for treatment, the patient has consented to  Procedure(s): ?RIGHT BREAST LUMPECTOMY WITH RADIOACTIVE SEED LOCALIZATION (Right) ?SENTINEL NODE BIOPSY (N/A) as a surgical intervention.  The patient's history has been reviewed, patient examined, no change in status, stable for surgery.  I have reviewed the patient's chart and labs.  Questions were answered to the patient's satisfaction.   ? ? ?Teletha Petrea A Elisavet Buehrer ? ? ?

## 2021-08-05 ENCOUNTER — Ambulatory Visit: Payer: Self-pay | Admitting: Surgery

## 2021-08-05 ENCOUNTER — Encounter (HOSPITAL_COMMUNITY): Payer: Self-pay | Admitting: Surgery

## 2021-08-05 DIAGNOSIS — C50911 Malignant neoplasm of unspecified site of right female breast: Secondary | ICD-10-CM

## 2021-08-05 NOTE — Anesthesia Postprocedure Evaluation (Signed)
Anesthesia Post Note ? ?Patient: Doris Reyes ? ?Procedure(s) Performed: RIGHT BREAST LUMPECTOMY WITH RADIOACTIVE SEED LOCALIZATION (Right: Breast) ?SENTINEL NODE BIOPSY ? ?  ? ?Patient location during evaluation: PACU ?Anesthesia Type: General and Regional ?Level of consciousness: awake and alert ?Pain management: pain level controlled ?Vital Signs Assessment: post-procedure vital signs reviewed and stable ?Respiratory status: spontaneous breathing, nonlabored ventilation, respiratory function stable and patient connected to nasal cannula oxygen ?Cardiovascular status: blood pressure returned to baseline and stable ?Postop Assessment: no apparent nausea or vomiting ?Anesthetic complications: no ? ? ?No notable events documented. ? ?Last Vitals:  ?Vitals:  ? 08/04/21 1031 08/04/21 1035  ?BP: (!) 91/53 (!) 95/51  ?Pulse: 64 63  ?Resp: 15 16  ?Temp:  36.6 ?C  ?SpO2: 95% 93%  ?  ?Last Pain:  ?Vitals:  ? 08/04/21 1035  ?TempSrc:   ?PainSc: 0-No pain  ? ? ?  ?  ?  ?  ?  ?  ? ?[place] ? ? ? ? ?

## 2021-08-10 ENCOUNTER — Encounter: Payer: Self-pay | Admitting: Surgery

## 2021-08-10 ENCOUNTER — Ambulatory Visit: Payer: Self-pay | Admitting: Surgery

## 2021-08-10 LAB — SURGICAL PATHOLOGY

## 2021-08-11 ENCOUNTER — Encounter (HOSPITAL_BASED_OUTPATIENT_CLINIC_OR_DEPARTMENT_OTHER): Payer: Self-pay | Admitting: Surgery

## 2021-08-11 ENCOUNTER — Other Ambulatory Visit: Payer: Self-pay

## 2021-08-18 ENCOUNTER — Other Ambulatory Visit: Payer: Self-pay

## 2021-08-18 ENCOUNTER — Encounter (HOSPITAL_BASED_OUTPATIENT_CLINIC_OR_DEPARTMENT_OTHER): Payer: Self-pay | Admitting: Surgery

## 2021-08-18 ENCOUNTER — Ambulatory Visit (HOSPITAL_BASED_OUTPATIENT_CLINIC_OR_DEPARTMENT_OTHER): Payer: Medicare Other | Admitting: Anesthesiology

## 2021-08-18 ENCOUNTER — Encounter (HOSPITAL_BASED_OUTPATIENT_CLINIC_OR_DEPARTMENT_OTHER)
Admission: RE | Disposition: A | Discharge status: Home / Self Care | Payer: Self-pay | Source: Home / Self Care | Attending: Surgery

## 2021-08-18 ENCOUNTER — Ambulatory Visit (HOSPITAL_BASED_OUTPATIENT_CLINIC_OR_DEPARTMENT_OTHER)
Admission: RE | Admit: 2021-08-18 | Discharge: 2021-08-18 | Disposition: A | Discharge status: Home / Self Care | Payer: Medicare Other | Attending: Surgery | Admitting: Surgery

## 2021-08-18 ENCOUNTER — Ambulatory Visit: Payer: Medicare Other | Admitting: Oncology

## 2021-08-18 DIAGNOSIS — G4730 Sleep apnea, unspecified: Secondary | ICD-10-CM | POA: Diagnosis not present

## 2021-08-18 DIAGNOSIS — N6011 Diffuse cystic mastopathy of right breast: Secondary | ICD-10-CM | POA: Diagnosis not present

## 2021-08-18 DIAGNOSIS — G4733 Obstructive sleep apnea (adult) (pediatric): Secondary | ICD-10-CM | POA: Diagnosis not present

## 2021-08-18 DIAGNOSIS — M797 Fibromyalgia: Secondary | ICD-10-CM | POA: Insufficient documentation

## 2021-08-18 DIAGNOSIS — K219 Gastro-esophageal reflux disease without esophagitis: Secondary | ICD-10-CM | POA: Insufficient documentation

## 2021-08-18 DIAGNOSIS — C50911 Malignant neoplasm of unspecified site of right female breast: Secondary | ICD-10-CM | POA: Insufficient documentation

## 2021-08-18 DIAGNOSIS — I10 Essential (primary) hypertension: Secondary | ICD-10-CM | POA: Diagnosis not present

## 2021-08-18 DIAGNOSIS — Z1589 Genetic susceptibility to other disease: Secondary | ICD-10-CM

## 2021-08-18 DIAGNOSIS — F418 Other specified anxiety disorders: Secondary | ICD-10-CM | POA: Diagnosis not present

## 2021-08-18 DIAGNOSIS — N62 Hypertrophy of breast: Secondary | ICD-10-CM | POA: Diagnosis not present

## 2021-08-18 DIAGNOSIS — Z9989 Dependence on other enabling machines and devices: Secondary | ICD-10-CM

## 2021-08-18 DIAGNOSIS — C50411 Malignant neoplasm of upper-outer quadrant of right female breast: Secondary | ICD-10-CM | POA: Diagnosis not present

## 2021-08-18 HISTORY — PX: RE-EXCISION OF BREAST LUMPECTOMY: SHX6048

## 2021-08-18 HISTORY — DX: Depression, unspecified: F32.A

## 2021-08-18 HISTORY — DX: Malignant (primary) neoplasm, unspecified: C80.1

## 2021-08-18 SURGERY — EXCISION, LESION, BREAST
Anesthesia: General | Site: Breast | Laterality: Right

## 2021-08-18 MED ORDER — DEXAMETHASONE SODIUM PHOSPHATE 10 MG/ML IJ SOLN
INTRAMUSCULAR | Status: DC | PRN
  Administered 2021-08-18: 5 mg via INTRAVENOUS

## 2021-08-18 MED ORDER — PHENYLEPHRINE HCL (PRESSORS) 10 MG/ML IV SOLN
INTRAVENOUS | Status: DC | PRN
  Administered 2021-08-18: 240 ug via INTRAVENOUS

## 2021-08-18 MED ORDER — FENTANYL CITRATE (PF) 100 MCG/2ML IJ SOLN
INTRAMUSCULAR | Status: DC | PRN
  Administered 2021-08-18: 50 ug via INTRAVENOUS
  Administered 2021-08-18 (×2): 25 ug via INTRAVENOUS

## 2021-08-18 MED ORDER — LIDOCAINE 2% (20 MG/ML) 5 ML SYRINGE
INTRAMUSCULAR | Status: AC
  Filled 2021-08-18: qty 5

## 2021-08-18 MED ORDER — ACETAMINOPHEN 500 MG PO TABS: 1000.0000 mg | ORAL_TABLET | Freq: Once | ORAL | Status: DC

## 2021-08-18 MED ORDER — PROPOFOL 10 MG/ML IV BOLUS
INTRAVENOUS | Status: AC
  Filled 2021-08-18: qty 20

## 2021-08-18 MED ORDER — ONDANSETRON HCL 4 MG/2ML IJ SOLN
INTRAMUSCULAR | Status: DC | PRN
  Administered 2021-08-18: 4 mg via INTRAVENOUS

## 2021-08-18 MED ORDER — LIDOCAINE HCL (CARDIAC) PF 100 MG/5ML IV SOSY
PREFILLED_SYRINGE | INTRAVENOUS | Status: DC | PRN
  Administered 2021-08-18: 80 mg via INTRATRACHEAL

## 2021-08-18 MED ORDER — FENTANYL CITRATE (PF) 100 MCG/2ML IJ SOLN
INTRAMUSCULAR | Status: AC
  Filled 2021-08-18: qty 2

## 2021-08-18 MED ORDER — BUPIVACAINE-EPINEPHRINE 0.25% -1:200000 IJ SOLN
INTRAMUSCULAR | Status: DC | PRN
Start: 2021-08-18 — End: 2021-08-18
  Administered 2021-08-18: 20 mL

## 2021-08-18 MED ORDER — OXYCODONE HCL 5 MG PO TABS
5.0000 mg | ORAL_TABLET | Freq: Once | ORAL | Status: AC
  Administered 2021-08-18: 5 mg via ORAL

## 2021-08-18 MED ORDER — ONDANSETRON HCL 4 MG/2ML IJ SOLN
INTRAMUSCULAR | Status: AC
  Filled 2021-08-18: qty 2

## 2021-08-18 MED ORDER — VANCOMYCIN HCL IN DEXTROSE 1-5 GM/200ML-% IV SOLN
1000.0000 mg | Freq: Once | INTRAVENOUS | Status: AC
  Administered 2021-08-18: 1000 mg via INTRAVENOUS

## 2021-08-18 MED ORDER — VANCOMYCIN HCL IN DEXTROSE 1-5 GM/200ML-% IV SOLN
INTRAVENOUS | Status: AC
  Filled 2021-08-18: qty 200

## 2021-08-18 MED ORDER — CHLORHEXIDINE GLUCONATE CLOTH 2 % EX PADS: 6.0000 | MEDICATED_PAD | Freq: Once | CUTANEOUS | Status: DC

## 2021-08-18 MED ORDER — OXYCODONE HCL 5 MG PO TABS
ORAL_TABLET | ORAL | Status: AC
  Filled 2021-08-18: qty 1

## 2021-08-18 MED ORDER — 0.9 % SODIUM CHLORIDE (POUR BTL) OPTIME
TOPICAL | Status: DC | PRN
  Administered 2021-08-18: 500 mL

## 2021-08-18 MED ORDER — ACETAMINOPHEN 500 MG PO TABS
ORAL_TABLET | ORAL | Status: AC
  Filled 2021-08-18: qty 2

## 2021-08-18 MED ORDER — PROPOFOL 10 MG/ML IV BOLUS
INTRAVENOUS | Status: DC | PRN
  Administered 2021-08-18: 150 mg via INTRAVENOUS
  Administered 2021-08-18: 20 mg via INTRAVENOUS

## 2021-08-18 MED ORDER — OXYCODONE HCL 5 MG PO TABS: 5.0000 mg | ORAL_TABLET | Freq: Four times a day (QID) | ORAL | 0 refills | Status: DC | PRN

## 2021-08-18 MED ORDER — FENTANYL CITRATE (PF) 100 MCG/2ML IJ SOLN
25.0000 ug | INTRAMUSCULAR | Status: DC | PRN
  Administered 2021-08-18: 50 ug via INTRAVENOUS

## 2021-08-18 MED ORDER — DEXAMETHASONE SODIUM PHOSPHATE 10 MG/ML IJ SOLN
INTRAMUSCULAR | Status: AC
  Filled 2021-08-18: qty 1

## 2021-08-18 MED ORDER — LACTATED RINGERS IV SOLN: INTRAVENOUS | Status: DC

## 2021-08-18 MED ORDER — FENTANYL CITRATE (PF) 100 MCG/2ML IJ SOLN
INTRAMUSCULAR | Status: AC
Start: 2021-08-18 — End: ?
  Filled 2021-08-18: qty 2

## 2021-08-18 SURGICAL SUPPLY — 44 items
ADH SKN CLS APL DERMABOND .7 (GAUZE/BANDAGES/DRESSINGS) ×1
APL PRP STRL LF DISP 70% ISPRP (MISCELLANEOUS) ×1
APPLIER CLIP 9.375 MED OPEN (MISCELLANEOUS) ×2
APR CLP MED 9.3 20 MLT OPN (MISCELLANEOUS) ×1
BINDER BREAST 3XL (GAUZE/BANDAGES/DRESSINGS) ×1 IMPLANT
BLADE SURG 15 STRL LF DISP TIS (BLADE) ×1 IMPLANT
BLADE SURG 15 STRL SS (BLADE) ×2
CANISTER SUCT 1200ML W/VALVE (MISCELLANEOUS) ×2 IMPLANT
CHLORAPREP W/TINT 26 (MISCELLANEOUS) ×2 IMPLANT
CLIP APPLIE 9.375 MED OPEN (MISCELLANEOUS) IMPLANT
COVER BACK TABLE 60X90IN (DRAPES) ×2 IMPLANT
COVER MAYO STAND STRL (DRAPES) ×2 IMPLANT
DERMABOND ADVANCED (GAUZE/BANDAGES/DRESSINGS) ×1
DERMABOND ADVANCED .7 DNX12 (GAUZE/BANDAGES/DRESSINGS) ×1 IMPLANT
DRAPE LAPAROTOMY 100X72 PEDS (DRAPES) ×2 IMPLANT
DRAPE UTILITY XL STRL (DRAPES) ×2 IMPLANT
ELECT COATED BLADE 2.86 ST (ELECTRODE) ×2 IMPLANT
ELECT REM PT RETURN 9FT ADLT (ELECTROSURGICAL) ×2
ELECTRODE REM PT RTRN 9FT ADLT (ELECTROSURGICAL) ×1 IMPLANT
GLOVE BIOGEL PI IND STRL 8 (GLOVE) ×1 IMPLANT
GLOVE BIOGEL PI INDICATOR 8 (GLOVE) ×1
GLOVE ECLIPSE 8.0 STRL XLNG CF (GLOVE) ×2 IMPLANT
GOWN STRL REUS W/ TWL LRG LVL3 (GOWN DISPOSABLE) ×2 IMPLANT
GOWN STRL REUS W/ TWL XL LVL3 (GOWN DISPOSABLE) ×1 IMPLANT
GOWN STRL REUS W/TWL LRG LVL3 (GOWN DISPOSABLE) ×4
GOWN STRL REUS W/TWL XL LVL3 (GOWN DISPOSABLE) ×2
HEMOSTAT ARISTA ABSORB 3G PWDR (HEMOSTASIS) IMPLANT
KIT MARKER MARGIN INK (KITS) ×1 IMPLANT
NDL HYPO 25X1 1.5 SAFETY (NEEDLE) ×1 IMPLANT
NEEDLE HYPO 25X1 1.5 SAFETY (NEEDLE) ×2 IMPLANT
NS IRRIG 1000ML POUR BTL (IV SOLUTION) ×2 IMPLANT
PACK BASIN DAY SURGERY FS (CUSTOM PROCEDURE TRAY) ×2 IMPLANT
PENCIL SMOKE EVACUATOR (MISCELLANEOUS) ×2 IMPLANT
SLEEVE SCD COMPRESS KNEE MED (STOCKING) ×2 IMPLANT
SPIKE FLUID TRANSFER (MISCELLANEOUS) ×2 IMPLANT
SPONGE T-LAP 4X18 ~~LOC~~+RFID (SPONGE) ×2 IMPLANT
STAPLER VISISTAT 35W (STAPLE) IMPLANT
SUT MON AB 4-0 PC3 18 (SUTURE) ×2 IMPLANT
SUT SILK 2 0 SH (SUTURE) IMPLANT
SUT VICRYL 3-0 CR8 SH (SUTURE) ×2 IMPLANT
SYR CONTROL 10ML LL (SYRINGE) ×2 IMPLANT
TOWEL GREEN STERILE FF (TOWEL DISPOSABLE) ×4 IMPLANT
TUBE CONNECTING 20X1/4 (TUBING) ×2 IMPLANT
YANKAUER SUCT BULB TIP NO VENT (SUCTIONS) ×2 IMPLANT

## 2021-08-18 NOTE — Transfer of Care (Signed)
Immediate Anesthesia Transfer of Care Note  Patient: Doris Reyes  Procedure(s) Performed: RE-EXCISION RIGHT BREAST LUMPECTOMY (Right: Breast)  Patient Location: PACU  Anesthesia Type:General  Level of Consciousness: drowsy, patient cooperative and responds to stimulation  Airway & Oxygen Therapy: Patient Spontanous Breathing and Patient connected to face mask oxygen  Post-op Assessment: Report given to RN and Post -op Vital signs reviewed and stable  Post vital signs: Reviewed and stable  Last Vitals:  Vitals Value Taken Time  BP    Temp    Pulse 68 08/18/21 1454  Resp 27 08/18/21 1454  SpO2 100 % 08/18/21 1454  Vitals shown include unvalidated device data.  Last Pain:  Vitals:   08/18/21 1316  TempSrc: Oral  PainSc: 4       Patients Stated Pain Goal: 4 (25/49/82 6415)  Complications: No notable events documented.

## 2021-08-18 NOTE — Anesthesia Preprocedure Evaluation (Addendum)
Anesthesia Evaluation  Patient identified by MRN, date of birth, ID band Patient awake    Reviewed: Allergy & Precautions, NPO status , Patient's Chart, lab work & pertinent test results, reviewed documented beta blocker date and time   Airway Mallampati: II  TM Distance: >3 FB Neck ROM: Full  Mouth opening: Limited Mouth Opening  Dental no notable dental hx. (+) Teeth Intact, Dental Advisory Given   Pulmonary sleep apnea and Continuous Positive Airway Pressure Ventilation ,    Pulmonary exam normal breath sounds clear to auscultation       Cardiovascular hypertension, Pt. on home beta blockers and Pt. on medications Normal cardiovascular exam Rhythm:Regular Rate:Normal     Neuro/Psych PSYCHIATRIC DISORDERS Anxiety Depression negative neurological ROS     GI/Hepatic Neg liver ROS, GERD  Controlled and Medicated,  Endo/Other  negative endocrine ROS  Renal/GU negative Renal ROS  negative genitourinary   Musculoskeletal  (+) Fibromyalgia -  Abdominal   Peds  Hematology negative hematology ROS (+)   Anesthesia Other Findings   Reproductive/Obstetrics                          Anesthesia Physical Anesthesia Plan  ASA: 3  Anesthesia Plan: General   Post-op Pain Management: Tylenol PO (pre-op)*   Induction: Intravenous  PONV Risk Score and Plan: 3 and Ondansetron, Dexamethasone and Treatment may vary due to age or medical condition  Airway Management Planned: LMA  Additional Equipment:   Intra-op Plan:   Post-operative Plan: Extubation in OR  Informed Consent: I have reviewed the patients History and Physical, chart, labs and discussed the procedure including the risks, benefits and alternatives for the proposed anesthesia with the patient or authorized representative who has indicated his/her understanding and acceptance.     Dental advisory given  Plan Discussed with:  CRNA  Anesthesia Plan Comments:        Anesthesia Quick Evaluation

## 2021-08-18 NOTE — Anesthesia Procedure Notes (Signed)
Procedure Name: LMA Insertion Date/Time: 08/18/2021 2:00 PM Performed by: Glory Buff, CRNA Pre-anesthesia Checklist: Patient identified, Emergency Drugs available, Suction available and Patient being monitored Patient Re-evaluated:Patient Re-evaluated prior to induction Oxygen Delivery Method: Circle system utilized Preoxygenation: Pre-oxygenation with 100% oxygen Induction Type: IV induction LMA: LMA inserted LMA Size: 3.0 Number of attempts: 1 Placement Confirmation: positive ETCO2 Tube secured with: Tape Dental Injury: Teeth and Oropharynx as per pre-operative assessment

## 2021-08-18 NOTE — Op Note (Signed)
Preoperative diagnosis: Right breast invasive lobular carcinoma  Postoperative diagnosis: Same  Procedure: Reexcision right breast lumpectomy  Surgeon: Erroll Luna, MD  Anesthesia: LMA with 0.25% Marcaine with epinephrine  Drains: None  Specimen: Margins of the lumpectomy cavity excised.  These were all oriented with ink.  Drains: None  Indications for procedure: The patient 71 year old female who had a lumpectomy for invasive lobular carcinoma.  She had close margins and positive margins and presents today for an attempted excision of the cavity.  The patient desires breast conserving surgery.  We discussed options of this versus mastectomy.  Risks and benefits of surgery were reviewed and she wished to proceed with reexcision to try to clear margins.The procedure has been discussed with the patient. Alternatives to surgery have been discussed with the patient.  Risks of surgery include bleeding,  Infection,  Seroma formation, death,  and the need for further surgery.   The patient understands and wishes to proceed.    Description of procedure: The patient was met in the holding area and questions were answered.  The right breast was marked as the correct site.  She was then taken back to the operating.  She was placed upon upon the OR table.  After induction of general anesthesia, the right breast was prepped and draped in a sterile fashion and timeout performed.  Proper patient, site and procedure verified.  The incision was opened and the seroma evacuated.  We then excised all margins of the cavity.  These were oriented with ink and passed off the field to pathology.  The cavity was read clipped.  Irrigation was used.  Local anesthetic was infiltrated throughout.  Hemostasis was achieved.  The cavity was then closed with a deep layer of 3-0 Vicryl.  4 Monocryl was used to reapproximate the skin edges.  Dermabond applied.  All counts found to be correct.  The patient was then awoke extubated  taken to recovery in satisfactory condition.

## 2021-08-18 NOTE — Discharge Instructions (Addendum)
Post Anesthesia Home Care Instructions  Activity: Get plenty of rest for the remainder of the day. A responsible individual must stay with you for 24 hours following the procedure.  For the next 24 hours, DO NOT: -Drive a car -Paediatric nurse -Drink alcoholic beverages -Take any medication unless instructed by your physician -Make any legal decisions or sign important papers.  Meals: Start with liquid foods such as gelatin or soup. Progress to regular foods as tolerated. Avoid greasy, spicy, heavy foods. If nausea and/or vomiting occur, drink only clear liquids until the nausea and/or vomiting subsides. Call your physician if vomiting continues.  Special Instructions/Symptoms: Your throat may feel dry or sore from the anesthesia or the breathing tube placed in your throat during surgery. If this causes discomfort, gargle with warm salt water. The discomfort should disappear within 24 hours.  If you had a scopolamine patch placed behind your ear for the management of post- operative nausea and/or vomiting:  1. The medication in the patch is effective for 72 hours, after which it should be removed.  Wrap patch in a tissue and discard in the trash. Wash hands thoroughly with soap and water. 2. You may remove the patch earlier than 72 hours if you experience unpleasant side effects which may include dry mouth, dizziness or visual disturbances. 3. Avoid touching the patch. Wash your hands with soap and water after contact with the patch.    [facility] [place] [facility] Phone Number [phone]  BREAST BIOPSY/ PARTIAL MASTECTOMY: POST OP INSTRUCTIONS  Always review your discharge instruction sheet given to you by the facility where your surgery was performed.  IF YOU HAVE DISABILITY OR FAMILY LEAVE FORMS, YOU MUST BRING THEM TO THE OFFICE FOR PROCESSING.  DO NOT GIVE THEM TO YOUR DOCTOR.  A prescription for pain medication may be given to you upon discharge.  Take your pain  medication as prescribed, if needed.  If narcotic pain medicine is not needed, then you may take acetaminophen (Tylenol) or ibuprofen (Advil) as needed. Take your usually prescribed medications unless otherwise directed If you need a refill on your pain medication, please contact your pharmacy.  They will contact our office to request authorization.  Prescriptions will not be filled after 5pm or on week-ends. You should eat very light the first 24 hours after surgery, such as soup, crackers, pudding, etc.  Resume your normal diet the day after surgery. Most patients will experience some swelling and bruising in the breast.  Ice packs and a good support bra will help.  Swelling and bruising can take several days to resolve.  It is common to experience some constipation if taking pain medication after surgery.  Increasing fluid intake and taking a stool softener will usually help or prevent this problem from occurring.  A mild laxative (Milk of Magnesia or Miralax) should be taken according to package directions if there are no bowel movements after 48 hours. Unless discharge instructions indicate otherwise, you may remove your bandages 24-48 hours after surgery, and you may shower at that time.  You may have steri-strips (small skin tapes) in place directly over the incision.  These strips should be left on the skin for 7-10 days.  If your surgeon used skin glue on the incision, you may shower in 24 hours.  The glue will flake off over the next 2-3 weeks.  Any sutures or staples will be removed at the office during your follow-up visit. ACTIVITIES:  You may resume regular daily activities (gradually increasing)  beginning the next day.  Wearing a good support bra or sports bra minimizes pain and swelling.  You may have sexual intercourse when it is comfortable. You may drive when you no longer are taking prescription pain medication, you can comfortably wear a seatbelt, and you can safely maneuver your car and  apply brakes. RETURN TO WORK:  ______________________________________________________________________________________ Mcbean Bast should see your doctor in the office for a follow-up appointment approximately two weeks after your surgery.  Your doctor's nurse will typically make your follow-up appointment when she calls you with your pathology report.  Expect your pathology report 2-3 business days after your surgery.  You may call to check if you do not hear from [place] after three days. OTHER INSTRUCTIONS: _______________________________________________________________________________________________ _____________________________________________________________________________________________________________________________________ _____________________________________________________________________________________________________________________________________ _____________________________________________________________________________________________________________________________________  WHEN TO CALL YOUR DOCTOR: Fever over 101.0 Nausea and/or vomiting. Extreme swelling or bruising. Continued bleeding from incision. Increased pain, redness, or drainage from the incision.  The clinic staff is available to answer your questions during regular business hours.  Please don't hesitate to call and ask to speak to one of the nurses for clinical concerns.  If you have a medical emergency, go to the nearest emergency room or call 911.  A surgeon from [facility] is always on call at the hospital.  For further questions, please visit [email or web address]

## 2021-08-18 NOTE — H&P (Signed)
Doris Reyes is an 71 y.o. female.   Chief Complaint: right breast cancer HPI: pt presents for re excision right breast lumpectomy due to positive margins( multiple) She is two weeks out from initial surgery She desires an attempt at breast conservation   Past Medical History:  Diagnosis Date   Anxiety    Cancer ([place])    right breast ILC   Complication of anesthesia    difficulty waking up   Depression    Diverticulitis    Family history of breast cancer    Family history of ovarian cancer    Family history of prostate cancer    Fibromyalgia    GERD (gastroesophageal reflux disease)    History of colonic polyps    Hyperlipidemia    Hypertension    IBS (irritable bowel syndrome)    Ischemic colitis ([place]) 03/2012   Osteoporosis    RLS (restless legs syndrome)    Sinusitis    Sleep apnea    CPAP nightly    Past Surgical History:  Procedure Laterality Date   BREAST LUMPECTOMY WITH RADIOACTIVE SEED LOCALIZATION Right 08/04/2021   Procedure: RIGHT BREAST LUMPECTOMY WITH RADIOACTIVE SEED LOCALIZATION;  Surgeon: Erroll Luna, MD;  Location: [facility];  Service: General;  Laterality: Right;   CHOLECYSTECTOMY  2004   COLONOSCOPY WITH PROPOFOL N/A 04/18/2017   Procedure: COLONOSCOPY WITH PROPOFOL;  Surgeon: [name], MD;  Location: [facility];  Service: Endoscopy;  Laterality: N/A;   ESOPHAGOGASTRODUODENOSCOPY (EGD) WITH PROPOFOL N/A 04/18/2017   Procedure: ESOPHAGOGASTRODUODENOSCOPY (EGD) WITH PROPOFOL;  Surgeon: [name], MD;  Location: [facility];  Service: Endoscopy;  Laterality: N/A;   FLEXIBLE SIGMOIDOSCOPY  04/20/2012   Procedure: FLEXIBLE SIGMOIDOSCOPY;  Surgeon: Beryle Beams, MD;  Location: [facility];  Service: Endoscopy;  Laterality: N/A;   NISSEN FUNDOPLICATION  4098   PARAESOPHAGEAL HERNIA REPAIR  09/11/2009   and Nissen fundoplication   SENTINEL NODE BIOPSY N/A 08/04/2021   Procedure: SENTINEL NODE BIOPSY;  Surgeon: Erroll Luna, MD;  Location: [facility];  Service: General;  Laterality: N/A;   TONSILLECTOMY  1960's   TOTAL ABDOMINAL HYSTERECTOMY  2001    Family History  Problem Relation Age of Onset   Colon cancer Mother 41   Alzheimer's disease Mother    Heart attack Father    Cancer Maternal Aunt        x2   Alzheimer's disease Maternal Aunt    Ovarian cancer Maternal Aunt    Prostate cancer Maternal Uncle    Kidney disease Maternal Uncle    Atrial fibrillation Maternal Uncle    Stroke Paternal Uncle    Aneurysm Paternal Grandmother        brain   Stroke Paternal Grandfather    Prostate cancer Cousin        paternal first cousin   Prostate cancer Cousin        paternal first cousin   Esophageal cancer Cousin        maternal first cousin   Breast cancer Cousin        DCIS, maternal first cousin   Ovarian cancer Cousin        maternal first cousin   Rheum arthritis Other    Lung disease Neg Hx    Social History:  reports that she has never smoked. She has never used smokeless tobacco. She reports that she does not drink alcohol and does not use drugs.  Allergies:  Allergies  Allergen Reactions   Ceftin [Cefuroxime Axetil] Diarrhea  Tramadol Hcl Nausea And Vomiting    No medications prior to admission.    No results found for this or any previous visit (from the past 48 hour(s)). No results found.  Review of Systems  All other systems reviewed and are negative.  Height '5\' 3"'$  (1.6 m), weight 94.3 kg. Physical Exam HENT:     Head: Normocephalic.  Chest:     Comments: Right breast incision intact  Neurological:     General: No focal deficit present.     Mental Status: She is alert.     Assessment/Plan Right re excision lumpectomy  The procedure has been discussed with the patient. Alternatives to surgery have been discussed with the patient.  Risks of surgery include bleeding,  Infection,  Seroma formation, death,  and the need for further surgery.   The patient understands and wishes to proceed.    Turner Daniels, MD 08/18/2021, 8:06 AM

## 2021-08-18 NOTE — Interval H&P Note (Signed)
History and Physical Interval Note:  08/18/2021 1:40 PM  Doris Reyes  has presented today for surgery, with the diagnosis of RIGHT BREAST CANCER.  The various methods of treatment have been discussed with the patient and family. After consideration of risks, benefits and other options for treatment, the patient has consented to  Procedure(s): RE-EXCISION RIGHT BREAST LUMPECTOMY (Right) as a surgical intervention.  The patient's history has been reviewed, patient examined, no change in status, stable for surgery.  I have reviewed the patient's chart and labs.  Questions were answered to the patient's satisfaction.     [place]

## 2021-08-18 NOTE — Anesthesia Postprocedure Evaluation (Signed)
Anesthesia Post Note  Patient: Doris Reyes  Procedure(s) Performed: RE-EXCISION RIGHT BREAST LUMPECTOMY (Right: Breast)     Patient location during evaluation: PACU Anesthesia Type: General Level of consciousness: awake and alert Pain management: pain level controlled Vital Signs Assessment: post-procedure vital signs reviewed and stable Respiratory status: spontaneous breathing, nonlabored ventilation, respiratory function stable and patient connected to nasal cannula oxygen Cardiovascular status: blood pressure returned to baseline and stable Postop Assessment: no apparent nausea or vomiting Anesthetic complications: no   No notable events documented.  Last Vitals:  Vitals:   08/18/21 1515 08/18/21 1530  BP: 121/63 120/62  Pulse: 65 65  Resp: 20 16  Temp:    SpO2: 98% 99%    Last Pain:  Vitals:   08/18/21 1512  TempSrc:   PainSc: 0-No pain                 [name] S

## 2021-08-19 ENCOUNTER — Encounter (HOSPITAL_BASED_OUTPATIENT_CLINIC_OR_DEPARTMENT_OTHER): Payer: Self-pay | Admitting: Surgery

## 2021-08-20 LAB — SURGICAL PATHOLOGY

## 2021-08-24 ENCOUNTER — Encounter: Payer: Self-pay | Admitting: Surgery

## 2021-08-24 DIAGNOSIS — F419 Anxiety disorder, unspecified: Secondary | ICD-10-CM | POA: Diagnosis not present

## 2021-08-24 DIAGNOSIS — J309 Allergic rhinitis, unspecified: Secondary | ICD-10-CM | POA: Diagnosis not present

## 2021-08-24 DIAGNOSIS — J40 Bronchitis, not specified as acute or chronic: Secondary | ICD-10-CM | POA: Diagnosis not present

## 2021-08-24 DIAGNOSIS — I10 Essential (primary) hypertension: Secondary | ICD-10-CM | POA: Diagnosis not present

## 2021-08-24 DIAGNOSIS — E7800 Pure hypercholesterolemia, unspecified: Secondary | ICD-10-CM | POA: Diagnosis not present

## 2021-08-24 DIAGNOSIS — K219 Gastro-esophageal reflux disease without esophagitis: Secondary | ICD-10-CM | POA: Diagnosis not present

## 2021-08-24 DIAGNOSIS — M797 Fibromyalgia: Secondary | ICD-10-CM | POA: Diagnosis not present

## 2021-08-27 ENCOUNTER — Encounter: Payer: Medicare Other | Admitting: Genetic Counselor

## 2021-09-01 ENCOUNTER — Telehealth: Payer: Self-pay | Admitting: Oncology

## 2021-09-01 ENCOUNTER — Inpatient Hospital Stay: Payer: Medicare Other | Attending: Oncology | Admitting: Oncology

## 2021-09-01 VITALS — BP 125/71 | HR 55 | Temp 97.6°F | Resp 18 | Ht 63.0 in | Wt 205.6 lb

## 2021-09-01 DIAGNOSIS — Z170 Estrogen receptor positive status [ER+]: Secondary | ICD-10-CM | POA: Diagnosis not present

## 2021-09-01 DIAGNOSIS — C50111 Malignant neoplasm of central portion of right female breast: Secondary | ICD-10-CM | POA: Diagnosis not present

## 2021-09-01 NOTE — Telephone Encounter (Signed)
Per 09/01/21 los next appt scheduled and confirmed with patient 

## 2021-09-01 NOTE — Progress Notes (Signed)
[place] 09/01/21  Patient Care Team: [name], MD as PCP - General (Family Medicine) [name], MD as Consulting Physician (Gastroenterology) [name], RN as Registered Nurse  Assessment & Plan    Invasive lobular carcinoma This was found on screening mammogram but the MRI reveals focal enhancement surrounding the biopsy clip up to 2.3 cm and there was non-mass enhancement posterior to this known malignancy on MRI.  This area may need to be biopsied in order to guide her ultimate surgery.This is strongly ER/PR positive, HER2 negative and has a low Ki 67 of less than 5%.  She has now had a lumpectomy but had to go back for reexcision of the margins.  The sentinel lymph node was negative but she did have 2 primaries, measuring 2.4 cm and 2.2 cm, for a T2 N0 M0, stage IIA.  Even though she has several favorable characteristics, such as grade 1 histology and a low Ki 67, I still recommend that we pursue Endopredict testing to quantitate her risk for recurrence, and whether she would benefit from chemotherapy.  Strong family history Including multiple females with breast cancer, her mother had colon cancer at age 57, and another maternal aunt had ovarian cancer.  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  She has been counseled on the implications of this and is already getting regular colonoscopy.  However the information will also be passed on to family members to pursue testing.  Osteoporosis Her spine shows a T score of -2.6 for osteoporosis and the right femur has osteopenia.  At the very least she should be taking calcium and vitamin D, and we will want to discuss other interventions once we place her on an aromatase inhibitor.   She has now had her surgery and is recuperating well.  I reviewed the pathology with her and her family in detail.  Her prognosis is fair but she did have 2 T2 lesions and so I recommend  Endo predict testing.  I explained this and gave her some literature about it.  I recommend calcium and vitamin D and we will need to discuss other interventions for her osteoporosis.  I will have her return in 2 weeks to review the Endo predict results and make further decisions.  We can then move forward with radiotherapy or chemotherapy based on those results.  She will still eventually need hormonal therapy as well.  I discussed the assessment and treatment plan with the patient and her family.  They were provided an opportunity to ask questions and all were answered.  They agreed with the plan and demonstrated an understanding of the instructions.  The patient was advised to call back if she has further questions   I provided 40 minutes of face-to-face time during this this encounter and > 50% was spent counseling as documented under my assessment and plan.    [name], MD [place] [address] [place] [address] Dept: [phone] Dept Fax: [phone]     CHIEF COMPLAINTS/PURPOSE OF CONSULTATION:  New breast cancer  HISTORY OF PRESENTING ILLNESS:  Doris Reyes 71 y.o. 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  She does have annual mammograms because of her strong family history, but was on hormone replacement therapy for 10 years.  She had a screening  mammogram at [facility] on May 05, 2021 which revealed architectural distortion which was felt to be indeterminant.  She had a diagnostic mammogram on March 3 which showed mild nipple retraction and a 1 cm area of focal asymmetry in the retroareolar region.  Ultrasound revealed a hypoechoic irregular mass measuring 9 mm in that area.  On March 14 she had a biopsy performed which revealed a 0.9 cm irregular mass with spiculated margins in the right breast at 12:00,  approximately 2 cm from the nipple and anteriorly in the breast.  This was found to be grade 2 invasive mammary carcinoma as well as mammary carcinoma in situ.  The E-cadherin immunohistochemistry stains were negative and so this is consistent with invasive lobular carcinoma.  The estrogen receptors were positive at 95% and progesterone receptors positive at 95% with HER2 by immunohistochemistry positive at 2+ but FISH was negative.  Ki-67 was less than 5%.  The carcinoma in situ was positive for E-cadherin, consistent with ductal carcinoma in situ.  She had an MRI of the breast and was found to have focal enhancement in the right breast surrounding the biopsy clip and some non-mass enhancement posterior to the malignancy.  It was recommended that she have an MRI guided biopsy of the clumped non-mass enhancement area and this revealed invasive lobular carcinoma grade 1 with lobular carcinoma in situ and extensive fibrocystic changes with focal intraluminal microcalcification and intraductal papillomatosis.  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  It was recommended that she have colonoscopy beginning at age 29 or 60 years prior to the age of her first-degree use relatives age at colorectal cancer diagnosis.  It was recommended this be repeated every 5 years.  This was explained to her.     I reviewed her records extensively and collaborated the history with the patient.  SUMMARY OF ONCOLOGIC HISTORY: Oncology History  Cancer of central portion of right breast ([place])  06/08/2021 Initial Diagnosis   Breast cancer in female [facility])   07/23/2021 Genetic Testing   Negative  hereditary cancer genetic testing: no pathogenic variants detected in [facility] BRCAPlus Panel. Report date is July 23, 2021.  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  The report date is Jul 26, 2021.  The BRCAplus panel offered by [facility] and includes sequencing and deletion/duplication analysis for the following 8 genes: ATM, BRCA1, BRCA2, CDH1, CHEK2, PALB2, PTEN, and TP53.  Results of pan-cancer panel pending.   The CancerNext-Expanded gene panel offered by [facility] and includes sequencing and rearrangement analysis for the following 77 genes: AIP, ALK, APC*, ATM*, AXIN2, BAP1, BARD1, BLM, BMPR1A, BRCA1*, BRCA2*, BRIP1*, CDC73, CDH1*, CDK4, CDKN1B, CDKN2A, CHEK2*, CTNNA1, DICER1, FANCC, FH, FLCN, GALNT12, KIF1B, LZTR1, MAX, MEN1, MET, MLH1*, MSH2*, MSH3, MSH6*, MUTYH*, NBN, NF1*, NF2, NTHL1, PALB2*, PHOX2B, PMS2*, POT1, PRKAR1A, PTCH1, PTEN*, RAD51C*, RAD51D*, RB1, RECQL, RET, SDHA, SDHAF2, SDHB, SDHC, SDHD, SMAD4, SMARCA4, SMARCB1, SMARCE1, STK11, SUFU, TMEM127, TP53*, TSC1, TSC2, VHL and XRCC2 (sequencing and deletion/duplication); EGFR, EGLN1, HOXB13, KIT, MITF, PDGFRA, POLD1, and POLE (sequencing only); EPCAM  and GREM1 (deletion/duplication only). DNA and RNA analyses performed for * genes.      In terms of breast cancer risk profile:  She menarched at early age of 57-13 and went to surgical menopause at age 89 She had 1 pregnancy, her first child was born at age 79 She was exposed hormone replacement therapy for 10 years in the form of a patch.  She has significant positive family history of Breast and ovarian and colon cancer  INTERVAL HISTORY: Her surgery was delayed in order to get the additional biopsy and so it was performed on May 10 with a right breast lobectomy and sentinel lymph node.  This revealed 2 tumors of lobular carcinoma measuring  2.4 cm and 2.2 cm.  The invasive carcinoma was 1 mm from the anterior margin and 2 mm from the medial margin.  Carcinoma in situ focally involves the medial margin at less than 1 mm from anterior, superior and posterior margins.  She had extensive lobular neoplasia, i.e. atypical lobular hyperplasia.  The sentinel lymph node was negative.  So this was staged as a T2 N0 M0 with 2 primaries.  She went back for surgery on May 25 to have reexcision of the margins and all were negative.  She had a bone density scan in April and she was found to have osteoporosis of the spine with a T score of -2.6.  She had osteopenia of the right femur with a T score of -1.3.  She has been instructed to take calcium and vitamin D..  She is up-to-date on colonoscopy.The patient is here with her husband [name] and daughter [name] today.  She complains of soreness of the right breast.  She is otherwise doing well.  She denies cough, shortness of breath, hemoptysis, chest pain or wheezing.  She denies nausea, vomiting, bowel problems or abdominal pain.  She denies fevers, chills or night sweats.  MEDICAL HISTORY:  Past Medical History:  Diagnosis Date   Anxiety    Cancer ([place])    right breast ILC   Complication of anesthesia    difficulty waking up   Depression    Diverticulitis    Family history of breast cancer    Family history of ovarian cancer    Family history of prostate cancer    Fibromyalgia    GERD (gastroesophageal reflux disease)    History of colonic polyps    Hyperlipidemia    Hypertension    IBS (irritable bowel syndrome)    Ischemic colitis ([place]) 03/2012   Osteoporosis    RLS (restless legs syndrome)    Sinusitis    Sleep apnea    CPAP nightly  Vitamin D deficiency Iron deficiency treated with IV iron infusions in 2019  SURGICAL HISTORY: Past Surgical History:  Procedure Laterality Date   BREAST LUMPECTOMY WITH RADIOACTIVE SEED LOCALIZATION Right 08/04/2021   Procedure: RIGHT BREAST LUMPECTOMY  WITH RADIOACTIVE SEED LOCALIZATION;  Surgeon: Erroll Luna, MD;  Location: [facility];  Service: General;  Laterality: Right;   CHOLECYSTECTOMY  2004   COLONOSCOPY WITH PROPOFOL N/A 04/18/2017   Procedure: COLONOSCOPY WITH PROPOFOL;  Surgeon: [name], MD;  Location: [facility];  Service: Endoscopy;  Laterality: N/A;   ESOPHAGOGASTRODUODENOSCOPY (EGD) WITH PROPOFOL N/A 04/18/2017   Procedure: ESOPHAGOGASTRODUODENOSCOPY (EGD) WITH PROPOFOL;  Surgeon: [name], MD;  Location: [facility];  Service: Endoscopy;  Laterality: N/A;   FLEXIBLE SIGMOIDOSCOPY  04/20/2012   Procedure: FLEXIBLE SIGMOIDOSCOPY;  Surgeon: Beryle Beams, MD;  Location: [facility];  Service: Endoscopy;  Laterality: N/A;   NISSEN FUNDOPLICATION  [id number]   PARAESOPHAGEAL HERNIA REPAIR  09/11/2009   and Nissen fundoplication   RE-EXCISION OF BREAST LUMPECTOMY Right 08/18/2021   Procedure: RE-EXCISION RIGHT BREAST LUMPECTOMY;  Surgeon: Erroll Luna, MD;  Location: [facility];  Service: General;  Laterality: Right;   SENTINEL NODE BIOPSY N/A 08/04/2021   Procedure: SENTINEL NODE BIOPSY;  Surgeon: Erroll Luna, MD;  Location: [facility];  Service: General;  Laterality: N/A;   TONSILLECTOMY  1960's   TOTAL ABDOMINAL HYSTERECTOMY  2001  She had 2 large benign tumors of the ovaries removed with bilateral salpingo oophorectomy  SOCIAL HISTORY: Social History   Socioeconomic History   Marital status: Married    Spouse name: Jeneen Rinks   Number of children: 1   Years of education: Not on file   Highest education level: Some college, no degree  Occupational History   Occupation: disabled  Tobacco Use   Smoking status: Never   Smokeless tobacco: Never   Tobacco comments:    brief exposure through her husband  Vaping Use   Vaping Use: Never used  Substance and Sexual Activity   Alcohol use: Never    Alcohol/week: 0.0 standard drinks of alcohol   Drug use: Never   Sexual activity: Not Currently  Other Topics  Concern   Not on file  Social History Narrative   From [place] originally. Previously lived in [place]. Previously worked at [employer] also in a [employer]. Has also worked as a Network engineer for [employer]. No pets currently. No bird exposure. No known mold in her current home.    Lives with husband   Caffeine- coffee 2 c daily   Social Determinants of Health   Financial Resource Strain: Not on file  Food Insecurity: Not on file  Transportation Needs: Not on file  Physical Activity: Not on file  Stress: Not on file  Social Connections: Not on file  Intimate Partner Violence: Not on file   The patient is here with her husband [name] and daughter Museum/gallery conservator today.  She has never smoked and does not drink alcohol or use drugs. FAMILY HISTORY: Family History  Problem Relation Age of Onset   Colon cancer Mother 27   Alzheimer's disease Mother    Heart attack Father    Cancer Maternal Aunt        x2   Alzheimer's disease Maternal Aunt    Ovarian cancer Maternal Aunt    Prostate cancer Maternal Uncle    Kidney disease Maternal Uncle    Atrial fibrillation Maternal Uncle    Stroke Paternal Uncle    Aneurysm Paternal Grandmother        brain   Stroke Paternal Grandfather    Prostate cancer Cousin        paternal first cousin   Prostate cancer Cousin        paternal first cousin   Esophageal cancer Cousin        maternal first cousin   Breast cancer Cousin        DCIS, maternal first cousin   Ovarian cancer Cousin        maternal first cousin   Rheum arthritis Other    Lung disease Neg Hx   Ovarian cancer  maternal aunt                         75's Breast cancer                                                 cousin                                     65's Prostate cancer                                              maternal uncle                        4's  ALLERGIES:  is allergic to ceftin [cefuroxime axetil], cefuroxime,  pneumococcal vaccine, and tramadol hcl.  MEDICATIONS:  Current Outpatient Medications  Medication Sig Dispense Refill   acetaminophen (TYLENOL) 650 MG CR tablet 1,300 mg in the morning and at bedtime.     ALPRAZolam (XANAX) 0.25 MG tablet Take 0.25 mg by mouth at bedtime as needed for anxiety.     amLODipine (NORVASC) 5 MG tablet Take 5 mg by mouth daily.     Ascorbic Acid (VITAMIN C) 500 MG CHEW Chew 500 mg by mouth daily.     aspirin EC 81 MG tablet Take 81 mg by mouth daily. Swallow whole.     atenolol (TENORMIN) 50 MG tablet Take 75 mg by mouth daily.      benazepril (LOTENSIN) 10 MG tablet Take 10 mg by mouth daily.     benzonatate (TESSALON) 100 MG capsule Take 100-200 mg by mouth at bedtime.     Biotin 1000 MCG tablet Take 1,000 mcg by mouth daily.     Calcium Citrate-Vitamin D (CITRACAL + D PO) Take 1 tablet by mouth daily.     cetirizine (ZYRTEC) 10 MG tablet Take 10 mg by mouth daily as needed for allergies.      Cholecalciferol (VITAMIN D) 1000 UNITS capsule Take 2,000 Units by mouth daily.      cyclobenzaprine (FLEXERIL) 5 MG tablet Take 5 mg by mouth 3 (three) times daily as needed for muscle spasms.     diclofenac sodium (VOLTAREN) 1 % GEL Apply 1 application topically daily as needed (for pain). Apply as directed     dicyclomine (BENTYL) 10 MG capsule Take 10 mg by mouth 3 (three) times daily as needed for spasms.     diphenhydrAMINE (BENADRYL) 25 MG tablet Take 25 mg by mouth every 6 (six) hours as needed for allergies.     diphenoxylate-atropine (LOMOTIL) 2.5-0.025 MG tablet Take 2 tablets by mouth 4 (four) times daily as needed for diarrhea or loose stools.     doxycycline (VIBRA-TABS) 100 MG tablet Take 100 mg by mouth 2 (two) times daily.     DULoxetine (CYMBALTA) 60 MG capsule Take 120 mg by mouth daily.      EPINEPHrine 0.3 mg/0.3 mL IJ SOAJ injection Inject 0.3 mg into the muscle as needed for anaphylaxis.     famotidine (PEPCID) 20 MG tablet Take 20 mg by mouth at  bedtime.     fexofenadine (ALLEGRA) 180 MG tablet Take 180 mg by mouth daily.     fluticasone (FLONASE) 50 MCG/ACT nasal spray Place 1 spray into both nostrils daily.     Nutritional Supplements (JUICE PLUS FIBRE PO) Take 2 each by mouth daily. Fruits and Vegetables     Omega-3 Fatty Acids (FISH OIL) 1000 MG CAPS Take 1,000 mg by mouth daily.      ondansetron (ZOFRAN) 4 MG tablet Take 4 mg by mouth every 8 (eight) hours as needed for nausea or vomiting.     oxyCODONE (OXY IR/ROXICODONE) 5 MG immediate release tablet Take 1 tablet (5 mg total) by mouth every 6 (six) hours as needed for severe pain. 15 tablet 0   pantoprazole (PROTONIX) 40 MG tablet Take 40 mg by mouth daily.      Phenylephrine-APAP-Guaifenesin (TYLENOL SINUS SEVERE PO) Take 2 tablets by mouth daily as needed (for congestion).     polyvinyl alcohol (LIQUIFILM TEARS) 1.4 % ophthalmic solution Place 1 drop into both eyes as needed for dry eyes.     Probiotic Product (PROBIOTIC & ACIDOPHILUS EX ST PO) Take 1 capsule by mouth daily. Ultra Flora Plus Capsules     promethazine-dextromethorphan (PROMETHAZINE-DM) 6.25-15 MG/5ML syrup Take 5 mLs by mouth every 6 (six) hours as needed.     Propylene Glycol (SYSTANE BALANCE) 0.6 % SOLN Place 1 drop into both eyes daily.     rOPINIRole (REQUIP) 0.5 MG tablet Take 0.5 mg by mouth at bedtime.     simvastatin (ZOCOR) 10 MG tablet Take 10 mg by mouth at bedtime.       sucralfate (CARAFATE) 1 GM/10ML suspension Take 1 g by mouth 4 (four) times daily -  with meals and at bedtime.     traZODone (DESYREL) 50 MG tablet Take 50 mg by mouth at bedtime.     vitamin E 400 UNIT capsule Take 400 Units by mouth daily.       No current facility-administered medications for this visit.    REVIEW OF SYSTEMS:   Constitutional: Denies fevers, chills or abnormal night sweats Eyes: Denies blurriness of vision, double vision or watery eyes Ears, nose, mouth, throat, and face: Denies mucositis or sore  throat Respiratory: Denies cough, dyspnea or wheezes Cardiovascular: Denies palpitation, chest discomfort or lower extremity swelling Gastrointestinal:  Denies nausea, heartburn or change in bowel habits Skin: Denies abnormal skin rashes Lymphatics: Denies new lymphadenopathy or easy bruising Neurological:Denies numbness, tingling or new weaknesses Behavioral/Psych: Mood is stable, no new changes  All other systems were reviewed with the patient and are negative.  PHYSICAL EXAMINATION: ECOG PERFORMANCE STATUS: 0 - Asymptomatic  Vitals:   09/01/21 1128  BP: 125/71  Pulse: (!) 55  Resp: 18  Temp: 97.6 F (36.4 C)  SpO2: 99%    Filed Weights   09/01/21 1128  Weight: 205 lb 9.6 oz (93.3 kg)     GENERAL:alert, no distress and comfortable SKIN: skin color, texture, turgor are normal, no rashes or significant lesions EYES: normal, conjunctiva are pink and non-injected, sclera clear OROPHARYNX:no exudate, no erythema and lips, buccal mucosa, and tongue normal  NECK: supple, thyroid normal size, non-tender, without nodularity LYMPH:  no palpable lymphadenopathy in the cervical, axillary or inguinal LUNGS: clear to auscultation and percussion with normal breathing effort HEART: regular rate & rhythm and no murmurs and no lower extremity edema ABDOMEN:abdomen soft, non-tender and normal bowel sounds Musculoskeletal:no cyanosis of digits and no clubbing  PSYCH: alert & oriented  x 3 with fluent speech NEURO: no focal motor/sensory deficits BREASTS: No palpable mass in either breast.  She has a healing incision of the central right breast.  LABORATORY DATA:  I have reviewed the data as listed Lab Results  Component Value Date   WBC 6.6 07/28/2021   HGB 13.6 07/28/2021   HCT 42.1 07/28/2021   MCV 89.4 07/28/2021   PLT 266 07/28/2021   Lab Results  Component Value Date   NA 140 07/28/2021   K 4.3 07/28/2021   CL 105 07/28/2021   CO2 27 07/28/2021    RADIOGRAPHIC  STUDIES: I have personally reviewed the radiological images as listed and agreed with the findings in the report. No results found.

## 2021-09-01 NOTE — Progress Notes (Signed)
Face to  face with pt and family in [place]. Pt has completed two surgeries and is here to discuss her next steps with Dr. Hinton Rao.

## 2021-09-02 ENCOUNTER — Encounter (HOSPITAL_COMMUNITY): Payer: Self-pay

## 2021-09-03 DIAGNOSIS — J310 Chronic rhinitis: Secondary | ICD-10-CM | POA: Diagnosis not present

## 2021-09-03 DIAGNOSIS — R21 Rash and other nonspecific skin eruption: Secondary | ICD-10-CM | POA: Diagnosis not present

## 2021-09-03 DIAGNOSIS — T781XXD Other adverse food reactions, not elsewhere classified, subsequent encounter: Secondary | ICD-10-CM | POA: Diagnosis not present

## 2021-09-03 DIAGNOSIS — Z91013 Allergy to seafood: Secondary | ICD-10-CM | POA: Diagnosis not present

## 2021-09-06 DIAGNOSIS — R293 Abnormal posture: Secondary | ICD-10-CM | POA: Diagnosis not present

## 2021-09-06 DIAGNOSIS — M25561 Pain in right knee: Secondary | ICD-10-CM | POA: Diagnosis not present

## 2021-09-06 DIAGNOSIS — M5459 Other low back pain: Secondary | ICD-10-CM | POA: Diagnosis not present

## 2021-09-06 DIAGNOSIS — M6281 Muscle weakness (generalized): Secondary | ICD-10-CM | POA: Diagnosis not present

## 2021-09-09 DIAGNOSIS — C50111 Malignant neoplasm of central portion of right female breast: Secondary | ICD-10-CM | POA: Diagnosis not present

## 2021-09-13 ENCOUNTER — Encounter: Payer: Self-pay | Admitting: Hematology and Oncology

## 2021-09-14 DIAGNOSIS — M810 Age-related osteoporosis without current pathological fracture: Secondary | ICD-10-CM | POA: Insufficient documentation

## 2021-09-15 ENCOUNTER — Telehealth: Payer: Self-pay

## 2021-09-15 DIAGNOSIS — M6281 Muscle weakness (generalized): Secondary | ICD-10-CM | POA: Diagnosis not present

## 2021-09-15 DIAGNOSIS — M5459 Other low back pain: Secondary | ICD-10-CM | POA: Diagnosis not present

## 2021-09-15 DIAGNOSIS — M25561 Pain in right knee: Secondary | ICD-10-CM | POA: Diagnosis not present

## 2021-09-15 DIAGNOSIS — R293 Abnormal posture: Secondary | ICD-10-CM | POA: Diagnosis not present

## 2021-09-15 NOTE — Telephone Encounter (Signed)
-----   Message from [name], MD sent at 09/13/2021  7:14 PM EDT ----- Regarding: call Tell her Endopredict test looks good, will discuss at Friday appt

## 2021-09-17 ENCOUNTER — Encounter: Payer: Self-pay | Admitting: Oncology

## 2021-09-17 ENCOUNTER — Inpatient Hospital Stay (HOSPITAL_BASED_OUTPATIENT_CLINIC_OR_DEPARTMENT_OTHER): Payer: Medicare Other | Admitting: Oncology

## 2021-09-17 VITALS — BP 129/78 | HR 53 | Temp 97.7°F | Resp 18 | Ht 63.0 in | Wt 208.3 lb

## 2021-09-17 DIAGNOSIS — C50111 Malignant neoplasm of central portion of right female breast: Secondary | ICD-10-CM

## 2021-09-17 DIAGNOSIS — Z1589 Genetic susceptibility to other disease: Secondary | ICD-10-CM

## 2021-09-17 DIAGNOSIS — M810 Age-related osteoporosis without current pathological fracture: Secondary | ICD-10-CM

## 2021-09-21 ENCOUNTER — Encounter (HOSPITAL_COMMUNITY): Payer: Self-pay

## 2021-09-22 DIAGNOSIS — M25561 Pain in right knee: Secondary | ICD-10-CM | POA: Diagnosis not present

## 2021-09-22 DIAGNOSIS — M5459 Other low back pain: Secondary | ICD-10-CM | POA: Diagnosis not present

## 2021-09-22 DIAGNOSIS — Z170 Estrogen receptor positive status [ER+]: Secondary | ICD-10-CM | POA: Diagnosis not present

## 2021-09-22 DIAGNOSIS — Z510 Encounter for antineoplastic radiation therapy: Secondary | ICD-10-CM | POA: Diagnosis not present

## 2021-09-22 DIAGNOSIS — R293 Abnormal posture: Secondary | ICD-10-CM | POA: Diagnosis not present

## 2021-09-22 DIAGNOSIS — C50811 Malignant neoplasm of overlapping sites of right female breast: Secondary | ICD-10-CM | POA: Diagnosis not present

## 2021-09-22 DIAGNOSIS — M6281 Muscle weakness (generalized): Secondary | ICD-10-CM | POA: Diagnosis not present

## 2021-09-24 DIAGNOSIS — Z170 Estrogen receptor positive status [ER+]: Secondary | ICD-10-CM | POA: Diagnosis not present

## 2021-09-24 DIAGNOSIS — M5459 Other low back pain: Secondary | ICD-10-CM | POA: Diagnosis not present

## 2021-09-24 DIAGNOSIS — R293 Abnormal posture: Secondary | ICD-10-CM | POA: Diagnosis not present

## 2021-09-24 DIAGNOSIS — M6281 Muscle weakness (generalized): Secondary | ICD-10-CM | POA: Diagnosis not present

## 2021-09-24 DIAGNOSIS — M25561 Pain in right knee: Secondary | ICD-10-CM | POA: Diagnosis not present

## 2021-09-24 DIAGNOSIS — C50811 Malignant neoplasm of overlapping sites of right female breast: Secondary | ICD-10-CM | POA: Diagnosis not present

## 2021-09-27 DIAGNOSIS — Z1272 Encounter for screening for malignant neoplasm of vagina: Secondary | ICD-10-CM | POA: Diagnosis not present

## 2021-09-27 DIAGNOSIS — Z779 Other contact with and (suspected) exposures hazardous to health: Secondary | ICD-10-CM | POA: Diagnosis not present

## 2021-09-27 DIAGNOSIS — Z6837 Body mass index (BMI) 37.0-37.9, adult: Secondary | ICD-10-CM | POA: Diagnosis not present

## 2021-09-29 ENCOUNTER — Encounter: Payer: Self-pay | Admitting: Oncology

## 2021-09-29 DIAGNOSIS — M6281 Muscle weakness (generalized): Secondary | ICD-10-CM | POA: Diagnosis not present

## 2021-09-29 DIAGNOSIS — M2560 Stiffness of unspecified joint, not elsewhere classified: Secondary | ICD-10-CM | POA: Diagnosis not present

## 2021-09-29 DIAGNOSIS — R2689 Other abnormalities of gait and mobility: Secondary | ICD-10-CM | POA: Diagnosis not present

## 2021-09-29 DIAGNOSIS — M79604 Pain in right leg: Secondary | ICD-10-CM | POA: Diagnosis not present

## 2021-09-29 DIAGNOSIS — R293 Abnormal posture: Secondary | ICD-10-CM | POA: Diagnosis not present

## 2021-09-29 DIAGNOSIS — M5459 Other low back pain: Secondary | ICD-10-CM | POA: Diagnosis not present

## 2021-09-29 DIAGNOSIS — C50811 Malignant neoplasm of overlapping sites of right female breast: Secondary | ICD-10-CM | POA: Diagnosis not present

## 2021-09-29 DIAGNOSIS — M25561 Pain in right knee: Secondary | ICD-10-CM | POA: Diagnosis not present

## 2021-09-29 DIAGNOSIS — Z170 Estrogen receptor positive status [ER+]: Secondary | ICD-10-CM | POA: Diagnosis not present

## 2021-09-29 DIAGNOSIS — Z510 Encounter for antineoplastic radiation therapy: Secondary | ICD-10-CM | POA: Diagnosis not present

## 2021-10-01 ENCOUNTER — Encounter: Payer: Medicare Other | Admitting: Genetic Counselor

## 2021-10-01 DIAGNOSIS — M5459 Other low back pain: Secondary | ICD-10-CM | POA: Diagnosis not present

## 2021-10-01 DIAGNOSIS — M2560 Stiffness of unspecified joint, not elsewhere classified: Secondary | ICD-10-CM | POA: Diagnosis not present

## 2021-10-01 DIAGNOSIS — Z510 Encounter for antineoplastic radiation therapy: Secondary | ICD-10-CM | POA: Diagnosis not present

## 2021-10-01 DIAGNOSIS — C50811 Malignant neoplasm of overlapping sites of right female breast: Secondary | ICD-10-CM | POA: Diagnosis not present

## 2021-10-01 DIAGNOSIS — M79604 Pain in right leg: Secondary | ICD-10-CM | POA: Diagnosis not present

## 2021-10-01 DIAGNOSIS — M25561 Pain in right knee: Secondary | ICD-10-CM | POA: Diagnosis not present

## 2021-10-01 DIAGNOSIS — Z170 Estrogen receptor positive status [ER+]: Secondary | ICD-10-CM | POA: Diagnosis not present

## 2021-10-04 DIAGNOSIS — M5459 Other low back pain: Secondary | ICD-10-CM | POA: Diagnosis not present

## 2021-10-04 DIAGNOSIS — M79604 Pain in right leg: Secondary | ICD-10-CM | POA: Diagnosis not present

## 2021-10-04 DIAGNOSIS — Z510 Encounter for antineoplastic radiation therapy: Secondary | ICD-10-CM | POA: Diagnosis not present

## 2021-10-04 DIAGNOSIS — M25561 Pain in right knee: Secondary | ICD-10-CM | POA: Diagnosis not present

## 2021-10-04 DIAGNOSIS — C50811 Malignant neoplasm of overlapping sites of right female breast: Secondary | ICD-10-CM | POA: Diagnosis not present

## 2021-10-04 DIAGNOSIS — M2560 Stiffness of unspecified joint, not elsewhere classified: Secondary | ICD-10-CM | POA: Diagnosis not present

## 2021-10-05 DIAGNOSIS — C50811 Malignant neoplasm of overlapping sites of right female breast: Secondary | ICD-10-CM | POA: Diagnosis not present

## 2021-10-05 DIAGNOSIS — M2560 Stiffness of unspecified joint, not elsewhere classified: Secondary | ICD-10-CM | POA: Diagnosis not present

## 2021-10-05 DIAGNOSIS — M79604 Pain in right leg: Secondary | ICD-10-CM | POA: Diagnosis not present

## 2021-10-05 DIAGNOSIS — M25561 Pain in right knee: Secondary | ICD-10-CM | POA: Diagnosis not present

## 2021-10-05 DIAGNOSIS — Z510 Encounter for antineoplastic radiation therapy: Secondary | ICD-10-CM | POA: Diagnosis not present

## 2021-10-05 DIAGNOSIS — M5459 Other low back pain: Secondary | ICD-10-CM | POA: Diagnosis not present

## 2021-10-06 DIAGNOSIS — M79604 Pain in right leg: Secondary | ICD-10-CM | POA: Diagnosis not present

## 2021-10-06 DIAGNOSIS — Z510 Encounter for antineoplastic radiation therapy: Secondary | ICD-10-CM | POA: Diagnosis not present

## 2021-10-06 DIAGNOSIS — C50811 Malignant neoplasm of overlapping sites of right female breast: Secondary | ICD-10-CM | POA: Diagnosis not present

## 2021-10-06 DIAGNOSIS — M25561 Pain in right knee: Secondary | ICD-10-CM | POA: Diagnosis not present

## 2021-10-06 DIAGNOSIS — M5459 Other low back pain: Secondary | ICD-10-CM | POA: Diagnosis not present

## 2021-10-06 DIAGNOSIS — M2560 Stiffness of unspecified joint, not elsewhere classified: Secondary | ICD-10-CM | POA: Diagnosis not present

## 2021-10-08 DIAGNOSIS — M79604 Pain in right leg: Secondary | ICD-10-CM | POA: Diagnosis not present

## 2021-10-08 DIAGNOSIS — C50811 Malignant neoplasm of overlapping sites of right female breast: Secondary | ICD-10-CM | POA: Diagnosis not present

## 2021-10-08 DIAGNOSIS — M25561 Pain in right knee: Secondary | ICD-10-CM | POA: Diagnosis not present

## 2021-10-08 DIAGNOSIS — M5459 Other low back pain: Secondary | ICD-10-CM | POA: Diagnosis not present

## 2021-10-08 DIAGNOSIS — Z510 Encounter for antineoplastic radiation therapy: Secondary | ICD-10-CM | POA: Diagnosis not present

## 2021-10-08 DIAGNOSIS — M2560 Stiffness of unspecified joint, not elsewhere classified: Secondary | ICD-10-CM | POA: Diagnosis not present

## 2021-10-11 DIAGNOSIS — M2560 Stiffness of unspecified joint, not elsewhere classified: Secondary | ICD-10-CM | POA: Diagnosis not present

## 2021-10-11 DIAGNOSIS — M25561 Pain in right knee: Secondary | ICD-10-CM | POA: Diagnosis not present

## 2021-10-11 DIAGNOSIS — C50811 Malignant neoplasm of overlapping sites of right female breast: Secondary | ICD-10-CM | POA: Diagnosis not present

## 2021-10-11 DIAGNOSIS — M79604 Pain in right leg: Secondary | ICD-10-CM | POA: Diagnosis not present

## 2021-10-11 DIAGNOSIS — M5459 Other low back pain: Secondary | ICD-10-CM | POA: Diagnosis not present

## 2021-10-11 DIAGNOSIS — Z510 Encounter for antineoplastic radiation therapy: Secondary | ICD-10-CM | POA: Diagnosis not present

## 2021-10-11 DIAGNOSIS — Z170 Estrogen receptor positive status [ER+]: Secondary | ICD-10-CM | POA: Diagnosis not present

## 2021-10-12 DIAGNOSIS — M2560 Stiffness of unspecified joint, not elsewhere classified: Secondary | ICD-10-CM | POA: Diagnosis not present

## 2021-10-12 DIAGNOSIS — Z510 Encounter for antineoplastic radiation therapy: Secondary | ICD-10-CM | POA: Diagnosis not present

## 2021-10-12 DIAGNOSIS — C50811 Malignant neoplasm of overlapping sites of right female breast: Secondary | ICD-10-CM | POA: Diagnosis not present

## 2021-10-12 DIAGNOSIS — M5459 Other low back pain: Secondary | ICD-10-CM | POA: Diagnosis not present

## 2021-10-12 DIAGNOSIS — M79604 Pain in right leg: Secondary | ICD-10-CM | POA: Diagnosis not present

## 2021-10-12 DIAGNOSIS — M25561 Pain in right knee: Secondary | ICD-10-CM | POA: Diagnosis not present

## 2021-10-14 DIAGNOSIS — M79604 Pain in right leg: Secondary | ICD-10-CM | POA: Diagnosis not present

## 2021-10-14 DIAGNOSIS — Z510 Encounter for antineoplastic radiation therapy: Secondary | ICD-10-CM | POA: Diagnosis not present

## 2021-10-14 DIAGNOSIS — C50811 Malignant neoplasm of overlapping sites of right female breast: Secondary | ICD-10-CM | POA: Diagnosis not present

## 2021-10-14 DIAGNOSIS — M25561 Pain in right knee: Secondary | ICD-10-CM | POA: Diagnosis not present

## 2021-10-14 DIAGNOSIS — M2560 Stiffness of unspecified joint, not elsewhere classified: Secondary | ICD-10-CM | POA: Diagnosis not present

## 2021-10-14 DIAGNOSIS — M5459 Other low back pain: Secondary | ICD-10-CM | POA: Diagnosis not present

## 2021-10-15 DIAGNOSIS — M25561 Pain in right knee: Secondary | ICD-10-CM | POA: Diagnosis not present

## 2021-10-15 DIAGNOSIS — M2560 Stiffness of unspecified joint, not elsewhere classified: Secondary | ICD-10-CM | POA: Diagnosis not present

## 2021-10-15 DIAGNOSIS — Z510 Encounter for antineoplastic radiation therapy: Secondary | ICD-10-CM | POA: Diagnosis not present

## 2021-10-15 DIAGNOSIS — M5459 Other low back pain: Secondary | ICD-10-CM | POA: Diagnosis not present

## 2021-10-15 DIAGNOSIS — C50811 Malignant neoplasm of overlapping sites of right female breast: Secondary | ICD-10-CM | POA: Diagnosis not present

## 2021-10-15 DIAGNOSIS — M79604 Pain in right leg: Secondary | ICD-10-CM | POA: Diagnosis not present

## 2021-10-16 NOTE — Progress Notes (Signed)
[place] 09/17/21  Patient Care Team: [name], MD as PCP - General (Family Medicine) [name], MD as Consulting Physician (Gastroenterology) [name], RN as Registered Nurse  Assessment & Plan    Invasive lobular carcinoma This was found on screening mammogram but the MRI reveals focal enhancement surrounding the biopsy clip up to 2.3 cm and there was non-mass enhancement posterior to this known malignancy on MRI.  This area was biopsied in order to guide her ultimate surgery.This is strongly ER/PR positive, HER2 negative and has a low Ki 67 of less than 5%.  She has now had a lumpectomy but had to go back for reexcision of the margins.  The sentinel lymph node was negative but she did have 2 primaries, measuring 2.4 cm and 2.2 cm, for a T2 N0 M0, stage IIA.  Even though she has several favorable characteristics, such as grade 1 histology and a low Ki 67, I still recommended that we pursue Endopredict testing to quantitate her risk for recurrence, and fortunately she has an EpClin score of 2.6, low risk.  Strong family history Including multiple females with breast cancer, her mother had colon cancer at age 6, and another maternal aunt had ovarian cancer.  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  She has been counseled on the implications of this and is already getting regular colonoscopy.  However the information will also be passed on to family members to pursue testing.  Osteoporosis Her spine shows a T score of -2.6 for osteoporosis and the right femur has osteopenia.  At the very least she should be taking calcium and vitamin D, and we will want to discuss other interventions once we place her on an aromatase inhibitor.   I reviewed the Endo predict testing with her and her family.  I explained her Epclin score is 2.6, which is low risk and correlates with a 5.1% risk of distant recurrence in the next 10  years. Her benefit from chemotherapy would be 1.0% so obviously we will not pursue that. Her risk for a late recurrence is 4.0%.  I recommend calcium and vitamin D and we will need to discuss other interventions for her osteoporosis.  I will have her return in 2 months. We can now move forward with radiotherapy.  She will still eventually need hormonal therapy as well.  I discussed the assessment and treatment plan with the patient and her family.  They were provided an opportunity to ask questions and all were answered.  They agreed with the plan and demonstrated an understanding of the instructions.  The patient was advised to call back if she has further questions   I provided 20 minutes of face-to-face time during this this encounter and > 50% was spent counseling as documented under my assessment and plan.    [name], MD [facility] [address] [place] [address] Dept: [phone] Dept Fax: [phone]     CHIEF COMPLAINTS/PURPOSE OF CONSULTATION:  Breast cancer, stage IIA  HISTORY OF PRESENTING ILLNESS:  Doris Reyes 71 y.o. 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  She does have annual mammograms because of her strong family history, but was on hormone replacement therapy for 10 years.  She had a screening mammogram at [facility] on May 05, 2021 which  revealed architectural distortion which was felt to be indeterminant.  She had a diagnostic mammogram on March 3 which showed mild nipple retraction and a 1 cm area of focal asymmetry in the retroareolar region.  Ultrasound revealed a hypoechoic irregular mass measuring 9 mm in that area.  On March 14 she had a biopsy performed which revealed a 0.9 cm irregular mass with spiculated margins in the right breast at 12:00, approximately 2 cm from the nipple and anteriorly in  the breast.  This was found to be grade 2 invasive mammary carcinoma as well as mammary carcinoma in situ.  The E-cadherin immunohistochemistry stains were negative and so this is consistent with invasive lobular carcinoma.  The estrogen receptors were positive at 95% and progesterone receptors positive at 95% with HER2 by immunohistochemistry positive at 2+ but FISH was negative.  Ki-67 was less than 5%.  The carcinoma in situ was positive for E-cadherin, consistent with ductal carcinoma in situ.  She had an MRI of the breast and was found to have focal enhancement in the right breast surrounding the biopsy clip and some non-mass enhancement posterior to the malignancy.  It was recommended that she have an MRI guided biopsy of the clumped non-mass enhancement area and this revealed invasive lobular carcinoma grade 1 with lobular carcinoma in situ and extensive fibrocystic changes with focal intraluminal microcalcification and intraductal papillomatosis.  Due to her strong family history including multiple females with breast cancer in her mother with colon cancer at age 7, she did have genetic testing with [facility] and was found to have a carrier mutation of MUTYH.  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  It was recommended that she have colonoscopy beginning at age 79 or 68 years prior to the age of her first-degree use relatives age at colorectal cancer diagnosis.  It was recommended this be repeated every 5 years.  This was explained to her.  She has now had a lumpectomy but had to go back for reexcision of the margins.  The sentinel lymph node was negative but she did have 2 primaries, measuring 2.4 cm and 2.2 cm, for a T2 N0 M0, stage IIA. This was ER/PR positive, HER 2 negative and a Ki-67 was 5%.  Even though she has several favorable characteristics, such as grade 1 histology  and a low Ki 67, I still recommended that we pursue Endopredict testing to quantitate her risk for recurrence, and fortunately she has an EpClin score of 2.6, low risk. This correlates with a 5.1% risk of distant recurrence in the next 10 years, with only a 1.0% benefit of chemotherapy. Her risk of late recurrence is 4.0%.      I reviewed her records extensively and collaborated the history with the patient.  SUMMARY OF ONCOLOGIC HISTORY: Oncology History  Cancer of central portion of right breast ([place])  06/08/2021 Initial Diagnosis   Breast cancer in female [facility])   07/23/2021 Genetic Testing   Negative hereditary cancer genetic testing: no pathogenic variants detected in [facility] BRCAPl[facility] Panel. Report date is July 23, 2021.  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  The report date is Jul 26, 2021.  The BRCAplus panel offered by [facility] and includes sequencing and deletion/duplication analysis for the following 8 genes:  ATM, BRCA1, BRCA2, CDH1, CHEK2, PALB2, PTEN, and TP53.  Results of pan-cancer panel pending.   The CancerNext-Expanded gene panel offered by [facility] and includes sequencing and rearrangement analysis for the following 77 genes: AIP, ALK, APC*, ATM*, AXIN2, BAP1, BARD1, BLM, BMPR1A, BRCA1*, BRCA2*, BRIP1*, CDC73, CDH1*, CDK4, CDKN1B, CDKN2A, CHEK2*, CTNNA1, DICER1, FANCC, FH, FLCN, GALNT12, KIF1B, LZTR1, MAX, MEN1, MET, MLH1*, MSH2*, MSH3, MSH6*, MUTYH*, NBN, NF1*, NF2, NTHL1, PALB2*, PHOX2B, PMS2*, POT1, PRKAR1A, PTCH1, PTEN*, RAD51C*, RAD51D*, RB1, RECQL, RET, SDHA, SDHAF2, SDHB, SDHC, SDHD, SMAD4, SMARCA4, SMARCB1, SMARCE1, STK11, SUFU, TMEM127, TP53*, TSC1, TSC2, VHL and XRCC2 (sequencing and deletion/duplication); EGFR, EGLN1, HOXB13, KIT, MITF, PDGFRA, POLD1, and POLE (sequencing only); EPCAM and GREM1 (deletion/duplication only).  DNA and RNA analyses performed for * genes.    08/23/2021 Cancer Staging   Staging form: Breast, AJCC 8th Edition - Pathologic stage from 08/23/2021: Stage IA (pT2(2), pN0(sn), cM0, G1, ER+, PR+, HER2-) - Signed by [name], MD on 09/29/2021 Histopathologic type: Lobular carcinoma, NOS Stage prefix: Initial diagnosis Method of lymph node assessment: Sentinel lymph node biopsy Nuclear grade: G1 Multigene prognostic tests performed: EndoPredict Histologic grading system: 3 grade system Residual tumor (R): R0 - None Laterality: Right Tumor size (mm): 24 Multiple tumors: Yes Number of tumors: 2 Lymph-vascular invasion (LVI): LVI not present (absent)/not identified Diagnostic confirmation: Positive histology PLUS positive immunophenotyping and/or positive genetic studies Specimen type: Excision Staged by: Managing physician Menopausal status: Postmenopausal Ki-67 (%): 5 Stage used in treatment planning: Yes National guidelines used in treatment planning: Yes Type of national guideline used in treatment planning: NCCN     In terms of breast cancer risk profile:  She menarched at early age of 61-13 and went to surgical menopause at age 68 She had 1 pregnancy, her first child was born at age 41 She was exposed hormone replacement therapy for 10 years in the form of a patch.  She has significant positive family history of Breast and ovarian and colon cancer  INTERVAL HISTORY: Her surgery was delayed in order to get the additional biopsy and so it was performed on May 10 with a right breast lumpectomy and sentinel lymph node.  This revealed 2 tumors of lobular carcinoma measuring 2.4 cm and 2.2 cm.  The invasive carcinoma was 1 mm from the anterior margin and 2 mm from the medial margin.  Carcinoma in situ focally involves the medial margin at less than 1 mm from anterior, superior and posterior margins.  She had extensive lobular neoplasia, i.e. atypical lobular hyperplasia.  The  sentinel lymph node was negative.  So this was staged as a T2 N0 M0 with 2 primaries.  She went back for surgery on May 25 to have reexcision of the margins and all were negative.  She had a bone density scan in April and she was found to have osteoporosis of the spine with a T score of -2.6.  She had osteopenia of the right femur with a T score of -1.3.  She has been instructed to take calcium and vitamin D..  She is up-to-date on colonoscopy.The patient is here with her husband [name] and daughter [name] today. We reviewed the Endopredict results and her Epclin score is 2.6, which is low risk and correlates with a 5.1% risk of distant recurrence in the next 10 years, and a chemotherapy benefit of 1.0%. Therefore we will proceed with radiation now and she will return after that to discuss hormonal therapy as well as treatment of her  osteoporosis. I have reviewed all of this information with them and gave them a copy of the report.  She denies cough, shortness of breath, hemoptysis, chest pain or wheezing.  She denies nausea, vomiting, bowel problems or abdominal pain.  She denies fevers, chills or night sweats.  MEDICAL HISTORY:  Past Medical History:  Diagnosis Date   Anxiety    Cancer ([place])    right breast ILC   Complication of anesthesia    difficulty waking up   Depression    Diverticulitis    Family history of breast cancer    Family history of ovarian cancer    Family history of prostate cancer    Fibromyalgia    GERD (gastroesophageal reflux disease)    History of colonic polyps    Hyperlipidemia    Hypertension    IBS (irritable bowel syndrome)    Ischemic colitis ([place]) 03/2012   Osteoporosis    RLS (restless legs syndrome)    Sinusitis    Sleep apnea    CPAP nightly  Vitamin D deficiency Iron deficiency treated with IV iron infusions in 2019  SURGICAL HISTORY: Past Surgical History:  Procedure Laterality Date   BREAST LUMPECTOMY WITH RADIOACTIVE SEED LOCALIZATION Right  08/04/2021   Procedure: RIGHT BREAST LUMPECTOMY WITH RADIOACTIVE SEED LOCALIZATION;  Surgeon: Erroll Luna, MD;  Location: [facility];  Service: General;  Laterality: Right;   CHOLECYSTECTOMY  2004   COLONOSCOPY WITH PROPOFOL N/A 04/18/2017   Procedure: COLONOSCOPY WITH PROPOFOL;  Surgeon: [name], MD;  Location: [facility];  Service: Endoscopy;  Laterality: N/A;   ESOPHAGOGASTRODUODENOSCOPY (EGD) WITH PROPOFOL N/A 04/18/2017   Procedure: ESOPHAGOGASTRODUODENOSCOPY (EGD) WITH PROPOFOL;  Surgeon: [name], MD;  Location: [facility];  Service: Endoscopy;  Laterality: N/A;   FLEXIBLE SIGMOIDOSCOPY  04/20/2012   Procedure: FLEXIBLE SIGMOIDOSCOPY;  Surgeon: Beryle Beams, MD;  Location: [facility];  Service: Endoscopy;  Laterality: N/A;   NISSEN FUNDOPLICATION  0092   PARAESOPHAGEAL HERNIA REPAIR  09/11/2009   and Nissen fundoplication   RE-EXCISION OF BREAST LUMPECTOMY Right 08/18/2021   Procedure: RE-EXCISION RIGHT BREAST LUMPECTOMY;  Surgeon: Erroll Luna, MD;  Location: [facility];  Service: General;  Laterality: Right;   SENTINEL NODE BIOPSY N/A 08/04/2021   Procedure: SENTINEL NODE BIOPSY;  Surgeon: Erroll Luna, MD;  Location: [facility];  Service: General;  Laterality: N/A;   TONSILLECTOMY  1960's   TOTAL ABDOMINAL HYSTERECTOMY  2001  She had 2 large benign tumors of the ovaries removed with bilateral salpingo oophorectomy  SOCIAL HISTORY: Social History   Socioeconomic History   Marital status: Married    Spouse name: Jeneen Rinks   Number of children: 1   Years of education: Not on file   Highest education level: Some college, no degree  Occupational History   Occupation: disabled  Tobacco Use   Smoking status: Never   Smokeless tobacco: Never   Tobacco comments:    brief exposure through her husband  Vaping Use   Vaping Use: Never used  Substance and Sexual Activity   Alcohol use: Never    Alcohol/week: 0.0 standard drinks of alcohol   Drug use: Never    Sexual activity: Not Currently  Other Topics Concern   Not on file  Social History Narrative   From [place]. Previously lived in [place]. Previously worked at [employer] also in a [employer]. Has also worked as a Network engineer for [employer]. No pets currently. No bird exposure. No known mold in her current  home.    Lives with husband   Caffeine- coffee 2 c daily   Social Determinants of Health   Financial Resource Strain: Not on file  Food Insecurity: Not on file  Transportation Needs: Not on file  Physical Activity: Not on file  Stress: Not on file  Social Connections: Not on file  Intimate Partner Violence: Not on file   The patient is here with her husband [name] and daughter Museum/gallery conservator today.  She has never smoked and does not drink alcohol or use drugs. FAMILY HISTORY: Family History  Problem Relation Age of Onset   Colon cancer Mother 58   Alzheimer's disease Mother    Heart attack Father    Cancer Maternal Aunt        x2   Alzheimer's disease Maternal Aunt    Ovarian cancer Maternal Aunt    Prostate cancer Maternal Uncle    Kidney disease Maternal Uncle    Atrial fibrillation Maternal Uncle    Stroke Paternal Uncle    Aneurysm Paternal Grandmother        brain   Stroke Paternal Grandfather    Prostate cancer Cousin        paternal first cousin   Prostate cancer Cousin        paternal first cousin   Esophageal cancer Cousin        maternal first cousin   Breast cancer Cousin        DCIS, maternal first cousin   Ovarian cancer Cousin        maternal first cousin   Rheum arthritis Other    Lung disease Neg Hx   Ovarian cancer                                               maternal aunt                         8's Breast cancer                                                 cousin                                     16's Prostate cancer                                              maternal uncle                        10's  ALLERGIES:  is allergic  to ceftin [cefuroxime axetil], cefuroxime, pneumococcal vaccine, and tramadol hcl.  MEDICATIONS:  Current Outpatient Medications  Medication Sig Dispense Refill   acetaminophen (TYLENOL) 650 MG CR tablet 1,300 mg in the morning and at bedtime.     ALPRAZolam (XANAX) 0.25 MG tablet Take 0.25 mg by mouth at bedtime as needed for anxiety.     amLODipine (NORVASC) 5 MG tablet Take 5 mg by mouth daily.  Ascorbic Acid (VITAMIN C) 500 MG CHEW Chew 500 mg by mouth daily.     aspirin EC 81 MG tablet Take 81 mg by mouth daily. Swallow whole.     atenolol (TENORMIN) 50 MG tablet Take 75 mg by mouth daily.      benazepril (LOTENSIN) 10 MG tablet Take 10 mg by mouth daily.     benzonatate (TESSALON) 100 MG capsule Take 100-200 mg by mouth at bedtime.     Biotin 1000 MCG tablet Take 1,000 mcg by mouth daily.     Calcium Citrate-Vitamin D (CITRACAL + D PO) Take 1 tablet by mouth daily.     cetirizine (ZYRTEC) 10 MG tablet Take 10 mg by mouth daily as needed for allergies.      Cholecalciferol (VITAMIN D) 1000 UNITS capsule Take 2,000 Units by mouth daily.      cyclobenzaprine (FLEXERIL) 5 MG tablet Take 5 mg by mouth 3 (three) times daily as needed for muscle spasms.     diclofenac sodium (VOLTAREN) 1 % GEL Apply 1 application topically daily as needed (for pain). Apply as directed     dicyclomine (BENTYL) 10 MG capsule Take 10 mg by mouth 3 (three) times daily as needed for spasms.     diphenhydrAMINE (BENADRYL) 25 MG tablet Take 25 mg by mouth every 6 (six) hours as needed for allergies.     diphenoxylate-atropine (LOMOTIL) 2.5-0.025 MG tablet Take 2 tablets by mouth 4 (four) times daily as needed for diarrhea or loose stools.     doxycycline (VIBRA-TABS) 100 MG tablet Take 100 mg by mouth 2 (two) times daily.     DULoxetine (CYMBALTA) 60 MG capsule Take 120 mg by mouth daily.      EPINEPHrine 0.3 mg/0.3 mL IJ SOAJ injection Inject 0.3 mg into the muscle as needed for anaphylaxis.     famotidine  (PEPCID) 20 MG tablet Take 20 mg by mouth at bedtime.     fexofenadine (ALLEGRA) 180 MG tablet Take 180 mg by mouth daily.     fluticasone (FLONASE) 50 MCG/ACT nasal spray Place 1 spray into both nostrils daily.     Nutritional Supplements (JUICE PLUS FIBRE PO) Take 2 each by mouth daily. Fruits and Vegetables     Omega-3 Fatty Acids (FISH OIL) 1000 MG CAPS Take 1,000 mg by mouth daily.      ondansetron (ZOFRAN) 4 MG tablet Take 4 mg by mouth every 8 (eight) hours as needed for nausea or vomiting.     oxyCODONE (OXY IR/ROXICODONE) 5 MG immediate release tablet Take 1 tablet (5 mg total) by mouth every 6 (six) hours as needed for severe pain. 15 tablet 0   pantoprazole (PROTONIX) 40 MG tablet Take 40 mg by mouth daily.      Phenylephrine-APAP-Guaifenesin (TYLENOL SINUS SEVERE PO) Take 2 tablets by mouth daily as needed (for congestion).     polyvinyl alcohol (LIQUIFILM TEARS) 1.4 % ophthalmic solution Place 1 drop into both eyes as needed for dry eyes.     Probiotic Product (PROBIOTIC & ACIDOPHILUS EX ST PO) Take 1 capsule by mouth daily. Ultra Flora Plus Capsules     promethazine-dextromethorphan (PROMETHAZINE-DM) 6.25-15 MG/5ML syrup Take 5 mLs by mouth every 6 (six) hours as needed.     Propylene Glycol (SYSTANE BALANCE) 0.6 % SOLN Place 1 drop into both eyes daily.     rOPINIRole (REQUIP) 0.5 MG tablet Take 0.5 mg by mouth at bedtime.     simvastatin (ZOCOR) 10 MG tablet Take 10  mg by mouth at bedtime.       sucralfate (CARAFATE) 1 GM/10ML suspension Take 1 g by mouth 4 (four) times daily -  with meals and at bedtime.     traZODone (DESYREL) 50 MG tablet Take 50 mg by mouth at bedtime.     vitamin E 400 UNIT capsule Take 400 Units by mouth daily.       No current facility-administered medications for this visit.    REVIEW OF SYSTEMS:   Constitutional: Denies fevers, chills or abnormal night sweats Eyes: Denies blurriness of vision, double vision or watery eyes Ears, nose, mouth, throat,  and face: Denies mucositis or sore throat Respiratory: Denies cough, dyspnea or wheezes Cardiovascular: Denies palpitation, chest discomfort or lower extremity swelling Gastrointestinal:  Denies nausea, heartburn or change in bowel habits Skin: Denies abnormal skin rashes Lymphatics: Denies new lymphadenopathy or easy bruising Neurological:Denies numbness, tingling or new weaknesses Behavioral/Psych: Mood is stable, no new changes  All other systems were reviewed with the patient and are negative.  PHYSICAL EXAMINATION: ECOG PERFORMANCE STATUS: 0 - Asymptomatic  Vitals:   09/17/21 1420  BP: 129/78  Pulse: (!) 53  Resp: 18  Temp: 97.7 F (36.5 C)  SpO2: 96%    Filed Weights   09/17/21 1420  Weight: 208 lb 4.8 oz (94.5 kg)     GENERAL:alert, no distress and comfortable SKIN: skin color, texture, turgor are normal, no rashes or significant lesions EYES: normal, conjunctiva are pink and non-injected, sclera clear OROPHARYNX:no exudate, no erythema and lips, buccal mucosa, and tongue normal  NECK: supple, thyroid normal size, non-tender, without nodularity LYMPH:  no palpable lymphadenopathy in the cervical, axillary or inguinal LUNGS: clear to auscultation and percussion with normal breathing effort HEART: regular rate & rhythm and no murmurs and no lower extremity edema ABDOMEN:abdomen soft, non-tender and normal bowel sounds Musculoskeletal:no cyanosis of digits and no clubbing  PSYCH: alert & oriented x 3 with fluent speech NEURO: no focal motor/sensory deficits BREASTS: No palpable mass in either breast.  She has a healing incision of the central right breast.  LABORATORY DATA:  I have reviewed the data as listed Lab Results  Component Value Date   WBC 6.6 07/28/2021   HGB 13.6 07/28/2021   HCT 42.1 07/28/2021   MCV 89.4 07/28/2021   PLT 266 07/28/2021   Lab Results  Component Value Date   NA 140 07/28/2021   K 4.3 07/28/2021   CL 105 07/28/2021   CO2 27  07/28/2021    RADIOGRAPHIC STUDIES: No results found.

## 2021-10-18 DIAGNOSIS — M2560 Stiffness of unspecified joint, not elsewhere classified: Secondary | ICD-10-CM | POA: Diagnosis not present

## 2021-10-18 DIAGNOSIS — Z510 Encounter for antineoplastic radiation therapy: Secondary | ICD-10-CM | POA: Diagnosis not present

## 2021-10-18 DIAGNOSIS — M79604 Pain in right leg: Secondary | ICD-10-CM | POA: Diagnosis not present

## 2021-10-18 DIAGNOSIS — C50811 Malignant neoplasm of overlapping sites of right female breast: Secondary | ICD-10-CM | POA: Diagnosis not present

## 2021-10-18 DIAGNOSIS — M5459 Other low back pain: Secondary | ICD-10-CM | POA: Diagnosis not present

## 2021-10-18 DIAGNOSIS — M25561 Pain in right knee: Secondary | ICD-10-CM | POA: Diagnosis not present

## 2021-10-19 DIAGNOSIS — Z510 Encounter for antineoplastic radiation therapy: Secondary | ICD-10-CM | POA: Diagnosis not present

## 2021-10-19 DIAGNOSIS — M5459 Other low back pain: Secondary | ICD-10-CM | POA: Diagnosis not present

## 2021-10-19 DIAGNOSIS — C50811 Malignant neoplasm of overlapping sites of right female breast: Secondary | ICD-10-CM | POA: Diagnosis not present

## 2021-10-19 DIAGNOSIS — M25561 Pain in right knee: Secondary | ICD-10-CM | POA: Diagnosis not present

## 2021-10-19 DIAGNOSIS — Z170 Estrogen receptor positive status [ER+]: Secondary | ICD-10-CM | POA: Diagnosis not present

## 2021-10-19 DIAGNOSIS — M79604 Pain in right leg: Secondary | ICD-10-CM | POA: Diagnosis not present

## 2021-10-19 DIAGNOSIS — M2560 Stiffness of unspecified joint, not elsewhere classified: Secondary | ICD-10-CM | POA: Diagnosis not present

## 2021-10-20 DIAGNOSIS — M2560 Stiffness of unspecified joint, not elsewhere classified: Secondary | ICD-10-CM | POA: Diagnosis not present

## 2021-10-20 DIAGNOSIS — M25561 Pain in right knee: Secondary | ICD-10-CM | POA: Diagnosis not present

## 2021-10-20 DIAGNOSIS — M79604 Pain in right leg: Secondary | ICD-10-CM | POA: Diagnosis not present

## 2021-10-20 DIAGNOSIS — M5459 Other low back pain: Secondary | ICD-10-CM | POA: Diagnosis not present

## 2021-10-20 DIAGNOSIS — C50811 Malignant neoplasm of overlapping sites of right female breast: Secondary | ICD-10-CM | POA: Diagnosis not present

## 2021-10-20 DIAGNOSIS — Z510 Encounter for antineoplastic radiation therapy: Secondary | ICD-10-CM | POA: Diagnosis not present

## 2021-10-21 DIAGNOSIS — Z510 Encounter for antineoplastic radiation therapy: Secondary | ICD-10-CM | POA: Diagnosis not present

## 2021-10-21 DIAGNOSIS — C50811 Malignant neoplasm of overlapping sites of right female breast: Secondary | ICD-10-CM | POA: Diagnosis not present

## 2021-10-21 DIAGNOSIS — M79604 Pain in right leg: Secondary | ICD-10-CM | POA: Diagnosis not present

## 2021-10-21 DIAGNOSIS — M5459 Other low back pain: Secondary | ICD-10-CM | POA: Diagnosis not present

## 2021-10-21 DIAGNOSIS — M2560 Stiffness of unspecified joint, not elsewhere classified: Secondary | ICD-10-CM | POA: Diagnosis not present

## 2021-10-21 DIAGNOSIS — M25561 Pain in right knee: Secondary | ICD-10-CM | POA: Diagnosis not present

## 2021-10-22 DIAGNOSIS — C50811 Malignant neoplasm of overlapping sites of right female breast: Secondary | ICD-10-CM | POA: Diagnosis not present

## 2021-10-22 DIAGNOSIS — Z510 Encounter for antineoplastic radiation therapy: Secondary | ICD-10-CM | POA: Diagnosis not present

## 2021-10-22 DIAGNOSIS — M5459 Other low back pain: Secondary | ICD-10-CM | POA: Diagnosis not present

## 2021-10-22 DIAGNOSIS — M2560 Stiffness of unspecified joint, not elsewhere classified: Secondary | ICD-10-CM | POA: Diagnosis not present

## 2021-10-22 DIAGNOSIS — M79604 Pain in right leg: Secondary | ICD-10-CM | POA: Diagnosis not present

## 2021-10-22 DIAGNOSIS — M25561 Pain in right knee: Secondary | ICD-10-CM | POA: Diagnosis not present

## 2021-10-25 DIAGNOSIS — M25561 Pain in right knee: Secondary | ICD-10-CM | POA: Diagnosis not present

## 2021-10-25 DIAGNOSIS — M5459 Other low back pain: Secondary | ICD-10-CM | POA: Diagnosis not present

## 2021-10-25 DIAGNOSIS — C50811 Malignant neoplasm of overlapping sites of right female breast: Secondary | ICD-10-CM | POA: Diagnosis not present

## 2021-10-25 DIAGNOSIS — M2560 Stiffness of unspecified joint, not elsewhere classified: Secondary | ICD-10-CM | POA: Diagnosis not present

## 2021-10-25 DIAGNOSIS — M79604 Pain in right leg: Secondary | ICD-10-CM | POA: Diagnosis not present

## 2021-10-25 DIAGNOSIS — Z510 Encounter for antineoplastic radiation therapy: Secondary | ICD-10-CM | POA: Diagnosis not present

## 2021-10-26 DIAGNOSIS — G4730 Sleep apnea, unspecified: Secondary | ICD-10-CM | POA: Diagnosis not present

## 2021-10-26 DIAGNOSIS — K589 Irritable bowel syndrome without diarrhea: Secondary | ICD-10-CM | POA: Diagnosis not present

## 2021-10-26 DIAGNOSIS — F419 Anxiety disorder, unspecified: Secondary | ICD-10-CM | POA: Diagnosis not present

## 2021-10-26 DIAGNOSIS — M797 Fibromyalgia: Secondary | ICD-10-CM | POA: Diagnosis not present

## 2021-10-26 DIAGNOSIS — Z510 Encounter for antineoplastic radiation therapy: Secondary | ICD-10-CM | POA: Diagnosis not present

## 2021-10-26 DIAGNOSIS — I10 Essential (primary) hypertension: Secondary | ICD-10-CM | POA: Diagnosis not present

## 2021-10-26 DIAGNOSIS — K219 Gastro-esophageal reflux disease without esophagitis: Secondary | ICD-10-CM | POA: Diagnosis not present

## 2021-10-26 DIAGNOSIS — Z170 Estrogen receptor positive status [ER+]: Secondary | ICD-10-CM | POA: Diagnosis not present

## 2021-10-26 DIAGNOSIS — C50811 Malignant neoplasm of overlapping sites of right female breast: Secondary | ICD-10-CM | POA: Diagnosis not present

## 2021-10-26 DIAGNOSIS — Z79899 Other long term (current) drug therapy: Secondary | ICD-10-CM | POA: Diagnosis not present

## 2021-10-27 DIAGNOSIS — Z79899 Other long term (current) drug therapy: Secondary | ICD-10-CM | POA: Diagnosis not present

## 2021-10-27 DIAGNOSIS — I10 Essential (primary) hypertension: Secondary | ICD-10-CM | POA: Diagnosis not present

## 2021-10-27 DIAGNOSIS — M797 Fibromyalgia: Secondary | ICD-10-CM | POA: Diagnosis not present

## 2021-10-27 DIAGNOSIS — K219 Gastro-esophageal reflux disease without esophagitis: Secondary | ICD-10-CM | POA: Diagnosis not present

## 2021-10-27 DIAGNOSIS — Z510 Encounter for antineoplastic radiation therapy: Secondary | ICD-10-CM | POA: Diagnosis not present

## 2021-10-27 DIAGNOSIS — C50811 Malignant neoplasm of overlapping sites of right female breast: Secondary | ICD-10-CM | POA: Diagnosis not present

## 2021-10-28 DIAGNOSIS — Z79899 Other long term (current) drug therapy: Secondary | ICD-10-CM | POA: Diagnosis not present

## 2021-10-28 DIAGNOSIS — K219 Gastro-esophageal reflux disease without esophagitis: Secondary | ICD-10-CM | POA: Diagnosis not present

## 2021-10-28 DIAGNOSIS — M797 Fibromyalgia: Secondary | ICD-10-CM | POA: Diagnosis not present

## 2021-10-28 DIAGNOSIS — C50811 Malignant neoplasm of overlapping sites of right female breast: Secondary | ICD-10-CM | POA: Diagnosis not present

## 2021-10-28 DIAGNOSIS — Z510 Encounter for antineoplastic radiation therapy: Secondary | ICD-10-CM | POA: Diagnosis not present

## 2021-10-28 DIAGNOSIS — I10 Essential (primary) hypertension: Secondary | ICD-10-CM | POA: Diagnosis not present

## 2021-10-29 DIAGNOSIS — K219 Gastro-esophageal reflux disease without esophagitis: Secondary | ICD-10-CM | POA: Diagnosis not present

## 2021-10-29 DIAGNOSIS — C50811 Malignant neoplasm of overlapping sites of right female breast: Secondary | ICD-10-CM | POA: Diagnosis not present

## 2021-10-29 DIAGNOSIS — M797 Fibromyalgia: Secondary | ICD-10-CM | POA: Diagnosis not present

## 2021-10-29 DIAGNOSIS — Z79899 Other long term (current) drug therapy: Secondary | ICD-10-CM | POA: Diagnosis not present

## 2021-10-29 DIAGNOSIS — I10 Essential (primary) hypertension: Secondary | ICD-10-CM | POA: Diagnosis not present

## 2021-10-29 DIAGNOSIS — Z510 Encounter for antineoplastic radiation therapy: Secondary | ICD-10-CM | POA: Diagnosis not present

## 2021-11-01 DIAGNOSIS — K219 Gastro-esophageal reflux disease without esophagitis: Secondary | ICD-10-CM | POA: Diagnosis not present

## 2021-11-01 DIAGNOSIS — M797 Fibromyalgia: Secondary | ICD-10-CM | POA: Diagnosis not present

## 2021-11-01 DIAGNOSIS — I10 Essential (primary) hypertension: Secondary | ICD-10-CM | POA: Diagnosis not present

## 2021-11-01 DIAGNOSIS — Z510 Encounter for antineoplastic radiation therapy: Secondary | ICD-10-CM | POA: Diagnosis not present

## 2021-11-01 DIAGNOSIS — C50811 Malignant neoplasm of overlapping sites of right female breast: Secondary | ICD-10-CM | POA: Diagnosis not present

## 2021-11-01 DIAGNOSIS — Z79899 Other long term (current) drug therapy: Secondary | ICD-10-CM | POA: Diagnosis not present

## 2021-11-02 DIAGNOSIS — Z79899 Other long term (current) drug therapy: Secondary | ICD-10-CM | POA: Diagnosis not present

## 2021-11-02 DIAGNOSIS — M797 Fibromyalgia: Secondary | ICD-10-CM | POA: Diagnosis not present

## 2021-11-02 DIAGNOSIS — Z510 Encounter for antineoplastic radiation therapy: Secondary | ICD-10-CM | POA: Diagnosis not present

## 2021-11-02 DIAGNOSIS — I10 Essential (primary) hypertension: Secondary | ICD-10-CM | POA: Diagnosis not present

## 2021-11-02 DIAGNOSIS — Z170 Estrogen receptor positive status [ER+]: Secondary | ICD-10-CM | POA: Diagnosis not present

## 2021-11-02 DIAGNOSIS — C50811 Malignant neoplasm of overlapping sites of right female breast: Secondary | ICD-10-CM | POA: Diagnosis not present

## 2021-11-02 DIAGNOSIS — K219 Gastro-esophageal reflux disease without esophagitis: Secondary | ICD-10-CM | POA: Diagnosis not present

## 2021-11-03 DIAGNOSIS — R87621 Atypical squamous cells cannot exclude high grade squamous intraepithelial lesion on cytologic smear of vagina (ASC-H): Secondary | ICD-10-CM | POA: Diagnosis not present

## 2021-11-03 DIAGNOSIS — Z79899 Other long term (current) drug therapy: Secondary | ICD-10-CM | POA: Diagnosis not present

## 2021-11-03 DIAGNOSIS — I10 Essential (primary) hypertension: Secondary | ICD-10-CM | POA: Diagnosis not present

## 2021-11-03 DIAGNOSIS — M797 Fibromyalgia: Secondary | ICD-10-CM | POA: Diagnosis not present

## 2021-11-03 DIAGNOSIS — C50811 Malignant neoplasm of overlapping sites of right female breast: Secondary | ICD-10-CM | POA: Diagnosis not present

## 2021-11-03 DIAGNOSIS — N870 Mild cervical dysplasia: Secondary | ICD-10-CM | POA: Diagnosis not present

## 2021-11-03 DIAGNOSIS — K219 Gastro-esophageal reflux disease without esophagitis: Secondary | ICD-10-CM | POA: Diagnosis not present

## 2021-11-03 DIAGNOSIS — Z510 Encounter for antineoplastic radiation therapy: Secondary | ICD-10-CM | POA: Diagnosis not present

## 2021-11-04 DIAGNOSIS — I10 Essential (primary) hypertension: Secondary | ICD-10-CM | POA: Diagnosis not present

## 2021-11-04 DIAGNOSIS — K219 Gastro-esophageal reflux disease without esophagitis: Secondary | ICD-10-CM | POA: Diagnosis not present

## 2021-11-04 DIAGNOSIS — M797 Fibromyalgia: Secondary | ICD-10-CM | POA: Diagnosis not present

## 2021-11-04 DIAGNOSIS — C50811 Malignant neoplasm of overlapping sites of right female breast: Secondary | ICD-10-CM | POA: Diagnosis not present

## 2021-11-04 DIAGNOSIS — Z79899 Other long term (current) drug therapy: Secondary | ICD-10-CM | POA: Diagnosis not present

## 2021-11-04 DIAGNOSIS — Z510 Encounter for antineoplastic radiation therapy: Secondary | ICD-10-CM | POA: Diagnosis not present

## 2021-11-05 DIAGNOSIS — C50811 Malignant neoplasm of overlapping sites of right female breast: Secondary | ICD-10-CM | POA: Diagnosis not present

## 2021-11-05 DIAGNOSIS — M797 Fibromyalgia: Secondary | ICD-10-CM | POA: Diagnosis not present

## 2021-11-05 DIAGNOSIS — K219 Gastro-esophageal reflux disease without esophagitis: Secondary | ICD-10-CM | POA: Diagnosis not present

## 2021-11-05 DIAGNOSIS — Z79899 Other long term (current) drug therapy: Secondary | ICD-10-CM | POA: Diagnosis not present

## 2021-11-05 DIAGNOSIS — I10 Essential (primary) hypertension: Secondary | ICD-10-CM | POA: Diagnosis not present

## 2021-11-05 DIAGNOSIS — Z510 Encounter for antineoplastic radiation therapy: Secondary | ICD-10-CM | POA: Diagnosis not present

## 2021-11-05 DIAGNOSIS — Z170 Estrogen receptor positive status [ER+]: Secondary | ICD-10-CM | POA: Diagnosis not present

## 2021-11-08 DIAGNOSIS — K219 Gastro-esophageal reflux disease without esophagitis: Secondary | ICD-10-CM | POA: Diagnosis not present

## 2021-11-08 DIAGNOSIS — Z510 Encounter for antineoplastic radiation therapy: Secondary | ICD-10-CM | POA: Diagnosis not present

## 2021-11-08 DIAGNOSIS — C50811 Malignant neoplasm of overlapping sites of right female breast: Secondary | ICD-10-CM | POA: Diagnosis not present

## 2021-11-08 DIAGNOSIS — M797 Fibromyalgia: Secondary | ICD-10-CM | POA: Diagnosis not present

## 2021-11-08 DIAGNOSIS — I10 Essential (primary) hypertension: Secondary | ICD-10-CM | POA: Diagnosis not present

## 2021-11-08 DIAGNOSIS — Z79899 Other long term (current) drug therapy: Secondary | ICD-10-CM | POA: Diagnosis not present

## 2021-11-09 DIAGNOSIS — I10 Essential (primary) hypertension: Secondary | ICD-10-CM | POA: Diagnosis not present

## 2021-11-09 DIAGNOSIS — M797 Fibromyalgia: Secondary | ICD-10-CM | POA: Diagnosis not present

## 2021-11-09 DIAGNOSIS — Z170 Estrogen receptor positive status [ER+]: Secondary | ICD-10-CM | POA: Diagnosis not present

## 2021-11-09 DIAGNOSIS — C50811 Malignant neoplasm of overlapping sites of right female breast: Secondary | ICD-10-CM | POA: Diagnosis not present

## 2021-11-09 DIAGNOSIS — K219 Gastro-esophageal reflux disease without esophagitis: Secondary | ICD-10-CM | POA: Diagnosis not present

## 2021-11-09 DIAGNOSIS — Z510 Encounter for antineoplastic radiation therapy: Secondary | ICD-10-CM | POA: Diagnosis not present

## 2021-11-09 DIAGNOSIS — Z79899 Other long term (current) drug therapy: Secondary | ICD-10-CM | POA: Diagnosis not present

## 2021-11-10 DIAGNOSIS — K219 Gastro-esophageal reflux disease without esophagitis: Secondary | ICD-10-CM | POA: Diagnosis not present

## 2021-11-10 DIAGNOSIS — C50811 Malignant neoplasm of overlapping sites of right female breast: Secondary | ICD-10-CM | POA: Diagnosis not present

## 2021-11-10 DIAGNOSIS — M797 Fibromyalgia: Secondary | ICD-10-CM | POA: Diagnosis not present

## 2021-11-10 DIAGNOSIS — Z79899 Other long term (current) drug therapy: Secondary | ICD-10-CM | POA: Diagnosis not present

## 2021-11-10 DIAGNOSIS — I10 Essential (primary) hypertension: Secondary | ICD-10-CM | POA: Diagnosis not present

## 2021-11-10 DIAGNOSIS — Z510 Encounter for antineoplastic radiation therapy: Secondary | ICD-10-CM | POA: Diagnosis not present

## 2021-11-11 DIAGNOSIS — I10 Essential (primary) hypertension: Secondary | ICD-10-CM | POA: Diagnosis not present

## 2021-11-11 DIAGNOSIS — K219 Gastro-esophageal reflux disease without esophagitis: Secondary | ICD-10-CM | POA: Diagnosis not present

## 2021-11-11 DIAGNOSIS — C50811 Malignant neoplasm of overlapping sites of right female breast: Secondary | ICD-10-CM | POA: Diagnosis not present

## 2021-11-11 DIAGNOSIS — Z510 Encounter for antineoplastic radiation therapy: Secondary | ICD-10-CM | POA: Diagnosis not present

## 2021-11-11 DIAGNOSIS — M797 Fibromyalgia: Secondary | ICD-10-CM | POA: Diagnosis not present

## 2021-11-11 DIAGNOSIS — Z79899 Other long term (current) drug therapy: Secondary | ICD-10-CM | POA: Diagnosis not present

## 2021-11-12 DIAGNOSIS — I10 Essential (primary) hypertension: Secondary | ICD-10-CM | POA: Diagnosis not present

## 2021-11-12 DIAGNOSIS — M797 Fibromyalgia: Secondary | ICD-10-CM | POA: Diagnosis not present

## 2021-11-12 DIAGNOSIS — Z79899 Other long term (current) drug therapy: Secondary | ICD-10-CM | POA: Diagnosis not present

## 2021-11-12 DIAGNOSIS — Z510 Encounter for antineoplastic radiation therapy: Secondary | ICD-10-CM | POA: Diagnosis not present

## 2021-11-12 DIAGNOSIS — C50811 Malignant neoplasm of overlapping sites of right female breast: Secondary | ICD-10-CM | POA: Diagnosis not present

## 2021-11-12 DIAGNOSIS — K219 Gastro-esophageal reflux disease without esophagitis: Secondary | ICD-10-CM | POA: Diagnosis not present

## 2021-11-15 DIAGNOSIS — M797 Fibromyalgia: Secondary | ICD-10-CM | POA: Diagnosis not present

## 2021-11-15 DIAGNOSIS — Z79899 Other long term (current) drug therapy: Secondary | ICD-10-CM | POA: Diagnosis not present

## 2021-11-15 DIAGNOSIS — C50811 Malignant neoplasm of overlapping sites of right female breast: Secondary | ICD-10-CM | POA: Diagnosis not present

## 2021-11-15 DIAGNOSIS — K219 Gastro-esophageal reflux disease without esophagitis: Secondary | ICD-10-CM | POA: Diagnosis not present

## 2021-11-15 DIAGNOSIS — Z510 Encounter for antineoplastic radiation therapy: Secondary | ICD-10-CM | POA: Diagnosis not present

## 2021-11-15 DIAGNOSIS — I10 Essential (primary) hypertension: Secondary | ICD-10-CM | POA: Diagnosis not present

## 2021-11-16 DIAGNOSIS — Z170 Estrogen receptor positive status [ER+]: Secondary | ICD-10-CM | POA: Diagnosis not present

## 2021-11-16 DIAGNOSIS — K219 Gastro-esophageal reflux disease without esophagitis: Secondary | ICD-10-CM | POA: Diagnosis not present

## 2021-11-16 DIAGNOSIS — Z79899 Other long term (current) drug therapy: Secondary | ICD-10-CM | POA: Diagnosis not present

## 2021-11-16 DIAGNOSIS — C50811 Malignant neoplasm of overlapping sites of right female breast: Secondary | ICD-10-CM | POA: Diagnosis not present

## 2021-11-16 DIAGNOSIS — Z510 Encounter for antineoplastic radiation therapy: Secondary | ICD-10-CM | POA: Diagnosis not present

## 2021-11-16 DIAGNOSIS — M797 Fibromyalgia: Secondary | ICD-10-CM | POA: Diagnosis not present

## 2021-11-16 DIAGNOSIS — I10 Essential (primary) hypertension: Secondary | ICD-10-CM | POA: Diagnosis not present

## 2021-11-17 ENCOUNTER — Ambulatory Visit: Payer: Medicare Other | Admitting: Oncology

## 2021-11-18 ENCOUNTER — Encounter: Payer: Self-pay | Admitting: Oncology

## 2021-11-18 ENCOUNTER — Inpatient Hospital Stay: Payer: Medicare Other | Attending: Oncology | Admitting: Oncology

## 2021-11-18 VITALS — BP 126/75 | HR 59 | Temp 97.6°F | Resp 18 | Ht 63.0 in | Wt 205.4 lb

## 2021-11-18 DIAGNOSIS — M810 Age-related osteoporosis without current pathological fracture: Secondary | ICD-10-CM | POA: Diagnosis not present

## 2021-11-18 DIAGNOSIS — Z1589 Genetic susceptibility to other disease: Secondary | ICD-10-CM | POA: Diagnosis not present

## 2021-11-18 DIAGNOSIS — C50111 Malignant neoplasm of central portion of right female breast: Secondary | ICD-10-CM

## 2021-11-18 NOTE — Progress Notes (Signed)
[place] 09/17/21  Patient Care Team: [name], MD as PCP - General (Family Medicine) [name], MD as Consulting Physician (Gastroenterology) [name], RN as Registered Nurse  Assessment & Plan    Invasive lobular carcinoma This was found on screening mammogram but the MRI reveals focal enhancement surrounding the biopsy clip up to 2.3 cm and there was non-mass enhancement posterior to this known malignancy on MRI.  This area was biopsied in order to guide her ultimate surgery.This is strongly ER/PR positive, HER2 negative and has a low Ki 67 of less than 5%.  She has now had a lumpectomy but had to go back for reexcision of the margins.  The sentinel lymph node was negative but she did have 2 primaries, measuring 2.4 cm and 2.2 cm, for a T2 N0 M0, stage IIA.  Even though she has several favorable characteristics, such as grade 1 histology and a low Ki 67, I still recommended that we pursue Endopredict testing to quantitate her risk for recurrence, and fortunately she has an EpClin score of 2.6, low risk.  Strong family history Including multiple females with breast cancer, her mother had colon cancer at age 6, and another maternal aunt had ovarian cancer.  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  She has been counseled on the implications of this and is already getting regular colonoscopy.  However the information will also be passed on to family members to pursue testing.  Osteoporosis Her spine shows a T score of -2.6 for osteoporosis and the right femur has osteopenia.  At the very least she should be taking calcium and vitamin D, and we will want to discuss other interventions once we place her on an aromatase inhibitor.   I reviewed the Endo predict testing with her and her family.  I explained her Epclin score is 2.6, which is low risk and correlates with a 5.1% risk of distant recurrence in the next 10  years. Her benefit from chemotherapy would be 1.0% so obviously we will not pursue that. Her risk for a late recurrence is 4.0%.  I recommend calcium and vitamin D and we will need to discuss other interventions for her osteoporosis.  I will have her return in 2 months. We can now move forward with radiotherapy.  She will still eventually need hormonal therapy as well.  I discussed the assessment and treatment plan with the patient and her family.  They were provided an opportunity to ask questions and all were answered.  They agreed with the plan and demonstrated an understanding of the instructions.  The patient was advised to call back if she has further questions   I provided 20 minutes of face-to-face time during this this encounter and > 50% was spent counseling as documented under my assessment and plan.    [name], MD [facility] [address] [place] [address] Dept: [phone] Dept Fax: [phone]     CHIEF COMPLAINTS/PURPOSE OF CONSULTATION:  Breast cancer, stage IIA  HISTORY OF PRESENTING ILLNESS:  Doris Reyes 71 y.o. 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  She does have annual mammograms because of her strong family history, but was on hormone replacement therapy for 10 years.  She had a screening mammogram at [facility] on May 05, 2021 which  revealed architectural distortion which was felt to be indeterminant.  She had a diagnostic mammogram on March 3 which showed mild nipple retraction and a 1 cm area of focal asymmetry in the retroareolar region.  Ultrasound revealed a hypoechoic irregular mass measuring 9 mm in that area.  On March 14 she had a biopsy performed which revealed a 0.9 cm irregular mass with spiculated margins in the right breast at 12:00, approximately 2 cm from the nipple and anteriorly in  the breast.  This was found to be grade 2 invasive mammary carcinoma as well as mammary carcinoma in situ.  The E-cadherin immunohistochemistry stains were negative and so this is consistent with invasive lobular carcinoma.  The estrogen receptors were positive at 95% and progesterone receptors positive at 95% with HER2 by immunohistochemistry positive at 2+ but FISH was negative.  Ki-67 was less than 5%.  The carcinoma in situ was positive for E-cadherin, consistent with ductal carcinoma in situ.  She had an MRI of the breast and was found to have focal enhancement in the right breast surrounding the biopsy clip and some non-mass enhancement posterior to the malignancy.  It was recommended that she have an MRI guided biopsy of the clumped non-mass enhancement area and this revealed invasive lobular carcinoma grade 1 with lobular carcinoma in situ and extensive fibrocystic changes with focal intraluminal microcalcification and intraductal papillomatosis.  Due to her strong family history including multiple females with breast cancer in her mother with colon cancer at age 7, she did have genetic testing with [facility] and was found to have a carrier mutation of MUTYH.  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  It was recommended that she have colonoscopy beginning at age 79 or 68 years prior to the age of her first-degree use relatives age at colorectal cancer diagnosis.  It was recommended this be repeated every 5 years.  This was explained to her.  She has now had a lumpectomy but had to go back for reexcision of the margins.  The sentinel lymph node was negative but she did have 2 primaries, measuring 2.4 cm and 2.2 cm, for a T2 N0 M0, stage IIA. This was ER/PR positive, HER 2 negative and a Ki-67 was 5%.  Even though she has several favorable characteristics, such as grade 1 histology  and a low Ki 67, I still recommended that we pursue Endopredict testing to quantitate her risk for recurrence, and fortunately she has an EpClin score of 2.6, low risk. This correlates with a 5.1% risk of distant recurrence in the next 10 years, with only a 1.0% benefit of chemotherapy. Her risk of late recurrence is 4.0%.      I reviewed her records extensively and collaborated the history with the patient.  SUMMARY OF ONCOLOGIC HISTORY: Oncology History  Cancer of central portion of right breast ([place])  06/08/2021 Initial Diagnosis   Breast cancer in female [facility])   07/23/2021 Genetic Testing   Negative hereditary cancer genetic testing: no pathogenic variants detected in [facility] BRCAPl[facility] Panel. Report date is July 23, 2021.  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  The report date is Jul 26, 2021.  The BRCAplus panel offered by [facility] and includes sequencing and deletion/duplication analysis for the following 8 genes:  ATM, BRCA1, BRCA2, CDH1, CHEK2, PALB2, PTEN, and TP53.  Results of pan-cancer panel pending.   The CancerNext-Expanded gene panel offered by [facility] and includes sequencing and rearrangement analysis for the following 77 genes: AIP, ALK, APC*, ATM*, AXIN2, BAP1, BARD1, BLM, BMPR1A, BRCA1*, BRCA2*, BRIP1*, CDC73, CDH1*, CDK4, CDKN1B, CDKN2A, CHEK2*, CTNNA1, DICER1, FANCC, FH, FLCN, GALNT12, KIF1B, LZTR1, MAX, MEN1, MET, MLH1*, MSH2*, MSH3, MSH6*, MUTYH*, NBN, NF1*, NF2, NTHL1, PALB2*, PHOX2B, PMS2*, POT1, PRKAR1A, PTCH1, PTEN*, RAD51C*, RAD51D*, RB1, RECQL, RET, SDHA, SDHAF2, SDHB, SDHC, SDHD, SMAD4, SMARCA4, SMARCB1, SMARCE1, STK11, SUFU, TMEM127, TP53*, TSC1, TSC2, VHL and XRCC2 (sequencing and deletion/duplication); EGFR, EGLN1, HOXB13, KIT, MITF, PDGFRA, POLD1, and POLE (sequencing only); EPCAM and GREM1 (deletion/duplication only).  DNA and RNA analyses performed for * genes.    08/23/2021 Cancer Staging   Staging form: Breast, AJCC 8th Edition - Pathologic stage from 08/23/2021: Stage IA (pT2(2), pN0(sn), cM0, G1, ER+, PR+, HER2-) - Signed by [name], MD on 09/29/2021 Histopathologic type: Lobular carcinoma, NOS Stage prefix: Initial diagnosis Method of lymph node assessment: Sentinel lymph node biopsy Nuclear grade: G1 Multigene prognostic tests performed: EndoPredict Histologic grading system: 3 grade system Residual tumor (R): R0 - None Laterality: Right Tumor size (mm): 24 Multiple tumors: Yes Number of tumors: 2 Lymph-vascular invasion (LVI): LVI not present (absent)/not identified Diagnostic confirmation: Positive histology PLUS positive immunophenotyping and/or positive genetic studies Specimen type: Excision Staged by: Managing physician Menopausal status: Postmenopausal Ki-67 (%): 5 Stage used in treatment planning: Yes National guidelines used in treatment planning: Yes Type of national guideline used in treatment planning: NCCN     In terms of breast cancer risk profile:  She menarched at early age of 61-13 and went to surgical menopause at age 68 She had 1 pregnancy, her first child was born at age 41 She was exposed hormone replacement therapy for 10 years in the form of a patch.  She has significant positive family history of Breast and ovarian and colon cancer  INTERVAL HISTORY: Her surgery was delayed in order to get the additional biopsy and so it was performed on May 10 with a right breast lumpectomy and sentinel lymph node.  This revealed 2 tumors of lobular carcinoma measuring 2.4 cm and 2.2 cm.  The invasive carcinoma was 1 mm from the anterior margin and 2 mm from the medial margin.  Carcinoma in situ focally involves the medial margin at less than 1 mm from anterior, superior and posterior margins.  She had extensive lobular neoplasia, i.e. atypical lobular hyperplasia.  The  sentinel lymph node was negative.  So this was staged as a T2 N0 M0 with 2 primaries.  She went back for surgery on May 25 to have reexcision of the margins and all were negative.  She had a bone density scan in April and she was found to have osteoporosis of the spine with a T score of -2.6.  She had osteopenia of the right femur with a T score of -1.3.  She has been instructed to take calcium and vitamin D..  She is up-to-date on colonoscopy.The patient is here with her husband [name] and daughter [name] today. We reviewed the Endopredict results and her Epclin score is 2.6, which is low risk and correlates with a 5.1% risk of distant recurrence in the next 10 years, and a chemotherapy benefit of 1.0%. Therefore we will proceed with radiation now and she will return after that to discuss hormonal therapy as well as treatment of her  osteoporosis. I have reviewed all of this information with them and gave them a copy of the report.  She denies cough, shortness of breath, hemoptysis, chest pain or wheezing.  She denies nausea, vomiting, bowel problems or abdominal pain.  She denies fevers, chills or night sweats.  MEDICAL HISTORY:  Past Medical History:  Diagnosis Date   Anxiety    Cancer ([place])    right breast ILC   Complication of anesthesia    difficulty waking up   Depression    Diverticulitis    Family history of breast cancer    Family history of ovarian cancer    Family history of prostate cancer    Fibromyalgia    GERD (gastroesophageal reflux disease)    History of colonic polyps    Hyperlipidemia    Hypertension    IBS (irritable bowel syndrome)    Ischemic colitis ([place]) 03/2012   Osteoporosis    RLS (restless legs syndrome)    Sinusitis    Sleep apnea    CPAP nightly  Vitamin D deficiency Iron deficiency treated with IV iron infusions in 2019  SURGICAL HISTORY: Past Surgical History:  Procedure Laterality Date   BREAST LUMPECTOMY WITH RADIOACTIVE SEED LOCALIZATION Right  08/04/2021   Procedure: RIGHT BREAST LUMPECTOMY WITH RADIOACTIVE SEED LOCALIZATION;  Surgeon: Erroll Luna, MD;  Location: [facility];  Service: General;  Laterality: Right;   CHOLECYSTECTOMY  2004   COLONOSCOPY WITH PROPOFOL N/A 04/18/2017   Procedure: COLONOSCOPY WITH PROPOFOL;  Surgeon: [name], MD;  Location: [facility];  Service: Endoscopy;  Laterality: N/A;   ESOPHAGOGASTRODUODENOSCOPY (EGD) WITH PROPOFOL N/A 04/18/2017   Procedure: ESOPHAGOGASTRODUODENOSCOPY (EGD) WITH PROPOFOL;  Surgeon: [name], MD;  Location: [facility];  Service: Endoscopy;  Laterality: N/A;   FLEXIBLE SIGMOIDOSCOPY  04/20/2012   Procedure: FLEXIBLE SIGMOIDOSCOPY;  Surgeon: Beryle Beams, MD;  Location: [facility];  Service: Endoscopy;  Laterality: N/A;   NISSEN FUNDOPLICATION  0092   PARAESOPHAGEAL HERNIA REPAIR  09/11/2009   and Nissen fundoplication   RE-EXCISION OF BREAST LUMPECTOMY Right 08/18/2021   Procedure: RE-EXCISION RIGHT BREAST LUMPECTOMY;  Surgeon: Erroll Luna, MD;  Location: [facility];  Service: General;  Laterality: Right;   SENTINEL NODE BIOPSY N/A 08/04/2021   Procedure: SENTINEL NODE BIOPSY;  Surgeon: Erroll Luna, MD;  Location: [facility];  Service: General;  Laterality: N/A;   TONSILLECTOMY  1960's   TOTAL ABDOMINAL HYSTERECTOMY  2001  She had 2 large benign tumors of the ovaries removed with bilateral salpingo oophorectomy  SOCIAL HISTORY: Social History   Socioeconomic History   Marital status: Married    Spouse name: Jeneen Rinks   Number of children: 1   Years of education: Not on file   Highest education level: Some college, no degree  Occupational History   Occupation: disabled  Tobacco Use   Smoking status: Never   Smokeless tobacco: Never   Tobacco comments:    brief exposure through her husband  Vaping Use   Vaping Use: Never used  Substance and Sexual Activity   Alcohol use: Never    Alcohol/week: 0.0 standard drinks of alcohol   Drug use: Never    Sexual activity: Not Currently  Other Topics Concern   Not on file  Social History Narrative   From [place]. Previously lived in [place]. Previously worked at [employer] also in a [employer]. Has also worked as a Network engineer for [employer]. No pets currently. No bird exposure. No known mold in her current  home.    Lives with husband   Caffeine- coffee 2 c daily   Social Determinants of Health   Financial Resource Strain: Not on file  Food Insecurity: Not on file  Transportation Needs: Not on file  Physical Activity: Not on file  Stress: Not on file  Social Connections: Not on file  Intimate Partner Violence: Not on file   The patient is here with her husband [name] and daughter Museum/gallery conservator today.  She has never smoked and does not drink alcohol or use drugs. FAMILY HISTORY: Family History  Problem Relation Age of Onset   Colon cancer Mother 58   Alzheimer's disease Mother    Heart attack Father    Cancer Maternal Aunt        x2   Alzheimer's disease Maternal Aunt    Ovarian cancer Maternal Aunt    Prostate cancer Maternal Uncle    Kidney disease Maternal Uncle    Atrial fibrillation Maternal Uncle    Stroke Paternal Uncle    Aneurysm Paternal Grandmother        brain   Stroke Paternal Grandfather    Prostate cancer Cousin        paternal first cousin   Prostate cancer Cousin        paternal first cousin   Esophageal cancer Cousin        maternal first cousin   Breast cancer Cousin        DCIS, maternal first cousin   Ovarian cancer Cousin        maternal first cousin   Rheum arthritis Other    Lung disease Neg Hx   Ovarian cancer                                               maternal aunt                         8's Breast cancer                                                 cousin                                     16's Prostate cancer                                              maternal uncle                        10's  ALLERGIES:  is allergic  to ceftin [cefuroxime axetil], cefuroxime, pneumococcal vaccine, and tramadol hcl.  MEDICATIONS:  Current Outpatient Medications  Medication Sig Dispense Refill   acetaminophen (TYLENOL) 650 MG CR tablet 1,300 mg in the morning and at bedtime.     ALPRAZolam (XANAX) 0.25 MG tablet Take 0.25 mg by mouth at bedtime as needed for anxiety.     amLODipine (NORVASC) 5 MG tablet Take 5 mg by mouth daily.  Ascorbic Acid (VITAMIN C) 500 MG CHEW Chew 500 mg by mouth daily.     aspirin EC 81 MG tablet Take 81 mg by mouth daily. Swallow whole.     atenolol (TENORMIN) 50 MG tablet Take 75 mg by mouth daily.      benazepril (LOTENSIN) 10 MG tablet Take 10 mg by mouth daily.     benzonatate (TESSALON) 100 MG capsule Take 100-200 mg by mouth at bedtime.     Biotin 1000 MCG tablet Take 1,000 mcg by mouth daily.     Calcium Citrate-Vitamin D (CITRACAL + D PO) Take 1 tablet by mouth daily.     cetirizine (ZYRTEC) 10 MG tablet Take 10 mg by mouth daily as needed for allergies.      Cholecalciferol (VITAMIN D) 1000 UNITS capsule Take 2,000 Units by mouth daily.      cyclobenzaprine (FLEXERIL) 5 MG tablet Take 5 mg by mouth 3 (three) times daily as needed for muscle spasms.     diclofenac sodium (VOLTAREN) 1 % GEL Apply 1 application topically daily as needed (for pain). Apply as directed     dicyclomine (BENTYL) 10 MG capsule Take 10 mg by mouth 3 (three) times daily as needed for spasms.     diphenhydrAMINE (BENADRYL) 25 MG tablet Take 25 mg by mouth every 6 (six) hours as needed for allergies.     diphenoxylate-atropine (LOMOTIL) 2.5-0.025 MG tablet Take 2 tablets by mouth 4 (four) times daily as needed for diarrhea or loose stools.     doxycycline (VIBRA-TABS) 100 MG tablet Take 100 mg by mouth 2 (two) times daily.     DULoxetine (CYMBALTA) 60 MG capsule Take 120 mg by mouth daily.      EPINEPHrine 0.3 mg/0.3 mL IJ SOAJ injection Inject 0.3 mg into the muscle as needed for anaphylaxis.     famotidine  (PEPCID) 20 MG tablet Take 20 mg by mouth at bedtime.     fexofenadine (ALLEGRA) 180 MG tablet Take 180 mg by mouth daily.     fluticasone (FLONASE) 50 MCG/ACT nasal spray Place 1 spray into both nostrils daily.     Nutritional Supplements (JUICE PLUS FIBRE PO) Take 2 each by mouth daily. Fruits and Vegetables     Omega-3 Fatty Acids (FISH OIL) 1000 MG CAPS Take 1,000 mg by mouth daily.      ondansetron (ZOFRAN) 4 MG tablet Take 4 mg by mouth every 8 (eight) hours as needed for nausea or vomiting.     oxyCODONE (OXY IR/ROXICODONE) 5 MG immediate release tablet Take 1 tablet (5 mg total) by mouth every 6 (six) hours as needed for severe pain. 15 tablet 0   pantoprazole (PROTONIX) 40 MG tablet Take 40 mg by mouth daily.      Phenylephrine-APAP-Guaifenesin (TYLENOL SINUS SEVERE PO) Take 2 tablets by mouth daily as needed (for congestion).     polyvinyl alcohol (LIQUIFILM TEARS) 1.4 % ophthalmic solution Place 1 drop into both eyes as needed for dry eyes.     Probiotic Product (PROBIOTIC & ACIDOPHILUS EX ST PO) Take 1 capsule by mouth daily. Ultra Flora Plus Capsules     promethazine-dextromethorphan (PROMETHAZINE-DM) 6.25-15 MG/5ML syrup Take 5 mLs by mouth every 6 (six) hours as needed.     Propylene Glycol (SYSTANE BALANCE) 0.6 % SOLN Place 1 drop into both eyes daily.     rOPINIRole (REQUIP) 0.5 MG tablet Take 0.5 mg by mouth at bedtime.     simvastatin (ZOCOR) 10 MG tablet Take 10  mg by mouth at bedtime.       sucralfate (CARAFATE) 1 GM/10ML suspension Take 1 g by mouth 4 (four) times daily -  with meals and at bedtime.     traZODone (DESYREL) 50 MG tablet Take 50 mg by mouth at bedtime.     vitamin E 400 UNIT capsule Take 400 Units by mouth daily.       No current facility-administered medications for this visit.    REVIEW OF SYSTEMS:   Constitutional: Denies fevers, chills or abnormal night sweats Eyes: Denies blurriness of vision, double vision or watery eyes Ears, nose, mouth, throat,  and face: Denies mucositis or sore throat Respiratory: Denies cough, dyspnea or wheezes Cardiovascular: Denies palpitation, chest discomfort or lower extremity swelling Gastrointestinal:  Denies nausea, heartburn or change in bowel habits Skin: Denies abnormal skin rashes Lymphatics: Denies new lymphadenopathy or easy bruising Neurological:Denies numbness, tingling or new weaknesses Behavioral/Psych: Mood is stable, no new changes  All other systems were reviewed with the patient and are negative.  PHYSICAL EXAMINATION: ECOG PERFORMANCE STATUS: 0 - Asymptomatic  There were no vitals filed for this visit.   There were no vitals filed for this visit.    GENERAL:alert, no distress and comfortable SKIN: skin color, texture, turgor are normal, no rashes or significant lesions EYES: normal, conjunctiva are pink and non-injected, sclera clear OROPHARYNX:no exudate, no erythema and lips, buccal mucosa, and tongue normal  NECK: supple, thyroid normal size, non-tender, without nodularity LYMPH:  no palpable lymphadenopathy in the cervical, axillary or inguinal LUNGS: clear to auscultation and percussion with normal breathing effort HEART: regular rate & rhythm and no murmurs and no lower extremity edema ABDOMEN:abdomen soft, non-tender and normal bowel sounds Musculoskeletal:no cyanosis of digits and no clubbing  PSYCH: alert & oriented x 3 with fluent speech NEURO: no focal motor/sensory deficits BREASTS: No palpable mass in either breast.  She has a healing incision of the central right breast.  LABORATORY DATA:  I have reviewed the data as listed Lab Results  Component Value Date   WBC 6.6 07/28/2021   HGB 13.6 07/28/2021   HCT 42.1 07/28/2021   MCV 89.4 07/28/2021   PLT 266 07/28/2021   Lab Results  Component Value Date   NA 140 07/28/2021   K 4.3 07/28/2021   CL 105 07/28/2021   CO2 27 07/28/2021    RADIOGRAPHIC STUDIES: No results found.

## 2021-11-24 DIAGNOSIS — G4733 Obstructive sleep apnea (adult) (pediatric): Secondary | ICD-10-CM | POA: Diagnosis not present

## 2021-12-06 DIAGNOSIS — C50811 Malignant neoplasm of overlapping sites of right female breast: Secondary | ICD-10-CM | POA: Diagnosis not present

## 2021-12-06 DIAGNOSIS — Z170 Estrogen receptor positive status [ER+]: Secondary | ICD-10-CM | POA: Diagnosis not present

## 2021-12-06 DIAGNOSIS — R87629 Unspecified abnormal cytological findings in specimens from vagina: Secondary | ICD-10-CM | POA: Diagnosis not present

## 2021-12-13 NOTE — Progress Notes (Signed)
[place] 11/18/21  Patient Care Team: [name], MD as PCP - General (Family Medicine) [name], MD as Consulting Physician (Gastroenterology) [name], RN as Registered Nurse  Assessment & Plan    Invasive lobular carcinoma This was found on screening mammogram but the MRI reveals focal enhancement surrounding the biopsy clip up to 2.3 cm and there was non-mass enhancement posterior to this known malignancy on MRI.  This area was biopsied in order to guide her ultimate surgery.This is strongly ER/PR positive, HER2 negative and has a low Ki 67 of less than 5%.  She has now had a lumpectomy but had to go back for reexcision of the margins.  The sentinel lymph node was negative but she did have 2 primaries, measuring 2.4 cm and 2.2 cm, for a T2 N0 M0, stage IIA.  Even though she has several favorable characteristics, such as grade 1 histology and a low Ki 67, I still recommended that we pursue Endopredict testing to quantitate her risk for recurrence, and fortunately she has an EpClin score of 2.6, low risk.  This correlates with a 5.1% risk of distant recurrence in the next 10 years. Her benefit from chemotherapy would be 1.0% so obviously we will not pursue that. Her risk for a late recurrence is 4.0%  Strong family history Including multiple females with breast cancer, her mother had colon cancer at age 12, and another maternal aunt had ovarian cancer.  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  She has been counseled on the implications of this and is already getting regular colonoscopy.  However the information will also be passed on to family members to pursue testing.  Osteoporosis Her spine shows a T score of -2.6 for osteoporosis and the right femur has osteopenia.  At the very least she should be taking calcium and vitamin D, and we will want to discuss other interventions once we place her on an aromatase  inhibitor.   I recommend calcium and vitamin D and we discussed other interventions for her osteoporosis.  I discussed the assessment and treatment plan with the patient and her family.  They were provided an opportunity to ask questions and all were answered.  They agreed with the plan and demonstrated an understanding of the instructions.  The patient was advised to call back if she has further questions   I provided 20 minutes of face-to-face time during this this encounter and > 50% was spent counseling as documented under my assessment and plan.    [name], MD [place] [address] [place] [address] Dept: [phone] Dept Fax: [phone]     CHIEF COMPLAINTS/PURPOSE OF CONSULTATION:  Breast cancer, stage IIA  HISTORY OF PRESENTING ILLNESS:  TAKENYA TRAVAGLINI 71 y.o. 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  She does have annual mammograms because of her strong family history, but was on hormone replacement therapy for 10 years.  She had a screening mammogram at [facility] on May 05, 2021 which revealed architectural distortion which was felt to be indeterminant.  She had a diagnostic mammogram on March 3 which showed mild nipple retraction and a 1 cm area of focal asymmetry in the retroareolar region.  Ultrasound revealed a hypoechoic irregular mass measuring 9 mm in that area.  On March 14  she had a biopsy performed which revealed a 0.9 cm irregular mass with spiculated margins in the right breast at 12:00, approximately 2 cm from the nipple and anteriorly in the breast.  This was found to be grade 2 invasive mammary carcinoma as well as mammary carcinoma in situ.  The E-cadherin immunohistochemistry stains were negative and so this is consistent with invasive lobular carcinoma.  The estrogen receptors were positive  at 95% and progesterone receptors positive at 95% with HER2 by immunohistochemistry positive at 2+ but FISH was negative.  Ki-67 was less than 5%.  The carcinoma in situ was positive for E-cadherin, consistent with ductal carcinoma in situ.  She had an MRI of the breast and was found to have focal enhancement in the right breast surrounding the biopsy clip and some non-mass enhancement posterior to the malignancy.  It was recommended that she have an MRI guided biopsy of the clumped non-mass enhancement area and this revealed invasive lobular carcinoma grade 1 with lobular carcinoma in situ and extensive fibrocystic changes with focal intraluminal microcalcification and intraductal papillomatosis.  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  It was recommended that she have colonoscopy beginning at age 63 or 78 years prior to the age of her first-degree use relatives age at colorectal cancer diagnosis.  It was recommended this be repeated every 5 years.  This was explained to her.  She has now had a lumpectomy but had to go back for reexcision of the margins.  The sentinel lymph node was negative but she did have 2 primaries, measuring 2.4 cm and 2.2 cm, for a T2 N0 M0, stage IIA. This was ER/PR positive, HER 2 negative and a Ki-67 was 5%.  Even though she has several favorable characteristics, such as grade 1 histology and a low Ki 67, I still recommended that we pursue Endopredict testing to quantitate her risk for recurrence, and fortunately she has an EpClin score of 2.6, low risk. This correlates with a 5.1% risk of distant recurrence in the next 10 years, with only a 1.0%  benefit of chemotherapy. Her risk of late recurrence is 4.0%.      I reviewed her records extensively and collaborated the history with the patient.  SUMMARY OF ONCOLOGIC HISTORY: Oncology History  Cancer of central portion of right breast ([place])  06/08/2021 Initial Diagnosis   Breast cancer in female [facility])   07/23/2021 Genetic Testing   Negative hereditary cancer genetic testing: no pathogenic variants detected in [facility] BRCAPlus Panel. Report date is July 23, 2021.  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  The report date is Jul 26, 2021.  The BRCAplus panel offered by [employer] and includes sequencing and deletion/duplication analysis for the following 8 genes: ATM, BRCA1, BRCA2, CDH1, CHEK2, PALB2, PTEN, and TP53.  Results of pan-cancer panel pending.   The CancerNext-Expanded gene panel offered by [facility] and includes sequencing and rearrangement analysis for the following 77 genes: AIP, ALK, APC*, ATM*, AXIN2, BAP1, BARD1, BLM, BMPR1A, BRCA1*, BRCA2*, BRIP1*, CDC73, CDH1*, CDK4, CDKN1B,  CDKN2A, CHEK2*, CTNNA1, DICER1, FANCC, FH, FLCN, GALNT12, KIF1B, LZTR1, MAX, MEN1, MET, MLH1*, MSH2*, MSH3, MSH6*, MUTYH*, NBN, NF1*, NF2, NTHL1, PALB2*, PHOX2B, PMS2*, POT1, PRKAR1A, PTCH1, PTEN*, RAD51C*, RAD51D*, RB1, RECQL, RET, SDHA, SDHAF2, SDHB, SDHC, SDHD, SMAD4, SMARCA4, SMARCB1, SMARCE1, STK11, SUFU, TMEM127, TP53*, TSC1, TSC2, VHL and XRCC2 (sequencing and deletion/duplication); EGFR, EGLN1, HOXB13, KIT, MITF, PDGFRA, POLD1, and POLE (sequencing only); EPCAM and GREM1 (deletion/duplication only). DNA and RNA analyses performed for * genes.    08/23/2021 Cancer Staging   Staging form: Breast, AJCC 8th Edition - Pathologic stage from 08/23/2021: Stage IA (pT2(2), pN0(sn), cM0, G1, ER+, PR+, HER2-) - Signed by [name], MD on  09/29/2021 Histopathologic type: Lobular carcinoma, NOS Stage prefix: Initial diagnosis Method of lymph node assessment: Sentinel lymph node biopsy Nuclear grade: G1 Multigene prognostic tests performed: EndoPredict Histologic grading system: 3 grade system Residual tumor (R): R0 - None Laterality: Right Tumor size (mm): 24 Multiple tumors: Yes Number of tumors: 2 Lymph-vascular invasion (LVI): LVI not present (absent)/not identified Diagnostic confirmation: Positive histology PLUS positive immunophenotyping and/or positive genetic studies Specimen type: Excision Staged by: Managing physician Menopausal status: Postmenopausal Ki-67 (%): 5 Stage used in treatment planning: Yes National guidelines used in treatment planning: Yes Type of national guideline used in treatment planning: NCCN     In terms of breast cancer risk profile:  She menarched at early age of 16-13 and went to surgical menopause at age 26 She had 1 pregnancy, her first child was born at age 58 She was exposed hormone replacement therapy for 10 years in the form of a patch.  She has significant positive family history of Breast and ovarian and colon cancer  INTERVAL HISTORY: Her surgery was delayed in order to get the additional biopsy and so it was performed on May 10 with a right breast lumpectomy and sentinel lymph node.  This revealed 2 tumors of lobular carcinoma measuring 2.4 cm and 2.2 cm.  The invasive carcinoma was 1 mm from the anterior margin and 2 mm from the medial margin.  Carcinoma in situ focally involves the medial margin at less than 1 mm from anterior, superior and posterior margins.  She had extensive lobular neoplasia, i.e. atypical lobular hyperplasia.  The sentinel lymph node was negative.  So this was staged as a T2 N0 M0 with 2 primaries.  She went back for surgery on May 25 to have reexcision of the margins and all were negative.  She had a bone density scan in April and she was found to have  osteoporosis of the spine with a T score of -2.6.  She had osteopenia of the right femur with a T score of -1.3.  She has been instructed to take calcium and vitamin D..  She is up-to-date on colonoscopy. She is here to discuss hormonal therapy as well as treatment of her osteoporosis.I recommend an aromatase inhibitor and reviewed the rationale, schedule and potential toxicities. She declines to take this. She has recently had an abnormal pap smear and will have vaginoscopy with biopsy by Dr. Vale Haven.  I have reviewed all of this information with them and gave them a copy of the report.  She denies cough, shortness of breath, hemoptysis, chest pain or wheezing.  She denies nausea, vomiting, bowel problems or abdominal pain.  She denies fevers, chills or night sweats.  MEDICAL HISTORY:  Past Medical History:  Diagnosis Date   Anxiety    Cancer [facility])    right breast ILC  Complication of anesthesia    difficulty waking up   Depression    Diverticulitis    Family history of breast cancer    Family history of ovarian cancer    Family history of prostate cancer    Fibromyalgia    GERD (gastroesophageal reflux disease)    History of colonic polyps    Hyperlipidemia    Hypertension    IBS (irritable bowel syndrome)    Ischemic colitis ([place]) 03/2012   Osteoporosis    RLS (restless legs syndrome)    Sinusitis    Sleep apnea    CPAP nightly  Vitamin D deficiency Iron deficiency treated with IV iron infusions in 2019  SURGICAL HISTORY: Past Surgical History:  Procedure Laterality Date   BREAST LUMPECTOMY WITH RADIOACTIVE SEED LOCALIZATION Right 08/04/2021   Procedure: RIGHT BREAST LUMPECTOMY WITH RADIOACTIVE SEED LOCALIZATION;  Surgeon: Erroll Luna, MD;  Location: [place];  Service: General;  Laterality: Right;   CHOLECYSTECTOMY  2004   COLONOSCOPY WITH PROPOFOL N/A 04/18/2017   Procedure: COLONOSCOPY WITH PROPOFOL;  Surgeon: [name], MD;  Location: [facility];  Service:  Endoscopy;  Laterality: N/A;   ESOPHAGOGASTRODUODENOSCOPY (EGD) WITH PROPOFOL N/A 04/18/2017   Procedure: ESOPHAGOGASTRODUODENOSCOPY (EGD) WITH PROPOFOL;  Surgeon: [name], MD;  Location: [facility];  Service: Endoscopy;  Laterality: N/A;   FLEXIBLE SIGMOIDOSCOPY  04/20/2012   Procedure: FLEXIBLE SIGMOIDOSCOPY;  Surgeon: Beryle Beams, MD;  Location: [facility];  Service: Endoscopy;  Laterality: N/A;   NISSEN FUNDOPLICATION  8088   PARAESOPHAGEAL HERNIA REPAIR  09/11/2009   and Nissen fundoplication   RE-EXCISION OF BREAST LUMPECTOMY Right 08/18/2021   Procedure: RE-EXCISION RIGHT BREAST LUMPECTOMY;  Surgeon: Erroll Luna, MD;  Location: [place];  Service: General;  Laterality: Right;   SENTINEL NODE BIOPSY N/A 08/04/2021   Procedure: SENTINEL NODE BIOPSY;  Surgeon: Erroll Luna, MD;  Location: [place];  Service: General;  Laterality: N/A;   TONSILLECTOMY  1960's   TOTAL ABDOMINAL HYSTERECTOMY  2001  She had 2 large benign tumors of the ovaries removed with bilateral salpingo oophorectomy  SOCIAL HISTORY: Social History   Socioeconomic History   Marital status: Married    Spouse name: Jeneen Rinks   Number of children: 1   Years of education: Not on file   Highest education level: Some college, no degree  Occupational History   Occupation: disabled  Tobacco Use   Smoking status: Never   Smokeless tobacco: Never   Tobacco comments:    brief exposure through her husband  Vaping Use   Vaping Use: Never used  Substance and Sexual Activity   Alcohol use: Never    Alcohol/week: 0.0 standard drinks of alcohol   Drug use: Never   Sexual activity: Not Currently  Other Topics Concern   Not on file  Social History Narrative   From  originally. Previously lived in [place]. Previously worked at [employer] also in a [employer]. Has also worked as a Network engineer for [employer]. No pets currently. No bird exposure. No known mold in her current home.     Lives with husband   Caffeine- coffee 2 c daily   Social Determinants of Health   Financial Resource Strain: Not on file  Food Insecurity: Not on file  Transportation Needs: Not on file  Physical Activity: Not on file  Stress: Not on file  Social Connections: Not on file  Intimate Partner Violence: Not on file   The patient is here with her husband Librarian, academic  and daughter Museum/gallery conservator today.  She has never smoked and does not drink alcohol or use drugs. FAMILY HISTORY: Family History  Problem Relation Age of Onset   Colon cancer Mother 62   Alzheimer's disease Mother    Heart attack Father    Cancer Maternal Aunt        x2   Alzheimer's disease Maternal Aunt    Ovarian cancer Maternal Aunt    Prostate cancer Maternal Uncle    Kidney disease Maternal Uncle    Atrial fibrillation Maternal Uncle    Stroke Paternal Uncle    Aneurysm Paternal Grandmother        brain   Stroke Paternal Grandfather    Prostate cancer Cousin        paternal first cousin   Prostate cancer Cousin        paternal first cousin   Esophageal cancer Cousin        maternal first cousin   Breast cancer Cousin        DCIS, maternal first cousin   Ovarian cancer Cousin        maternal first cousin   Rheum arthritis Other    Lung disease Neg Hx   Ovarian cancer                                               maternal aunt                         57's Breast cancer                                                 cousin                                     54's Prostate cancer                                              maternal uncle                        88's  ALLERGIES:  is allergic to ceftin [cefuroxime axetil], cefuroxime, pneumococcal vac polyvalent, pneumococcal vaccine, tramadol, and tramadol hcl.  MEDICATIONS:  Current Outpatient Medications  Medication Sig Dispense Refill   acetaminophen (TYLENOL) 650 MG CR tablet 1,300 mg in the morning and at bedtime.     ALPRAZolam (XANAX) 0.25 MG tablet Take 0.25 mg  by mouth at bedtime as needed for anxiety.     amLODipine (NORVASC) 5 MG tablet Take 5 mg by mouth daily.     Ascorbic Acid (VITAMIN C) 500 MG CHEW Chew 500 mg by mouth daily.     aspirin EC 81 MG tablet Take 81 mg by mouth daily. Swallow whole.     atenolol (TENORMIN) 50 MG tablet Take 75 mg by mouth daily.      benazepril (LOTENSIN) 10 MG tablet Take 10 mg by mouth daily.     benzonatate (TESSALON) 100 MG capsule  Take 100-200 mg by mouth at bedtime.     Biotin 1000 MCG tablet Take 1,000 mcg by mouth daily.     Calcium Citrate-Vitamin D (CITRACAL + D PO) Take 1 tablet by mouth daily.     cetirizine (ZYRTEC) 10 MG tablet Take 10 mg by mouth daily as needed for allergies.      Cholecalciferol (VITAMIN D) 1000 UNITS capsule Take 2,000 Units by mouth daily.      cyclobenzaprine (FLEXERIL) 5 MG tablet Take 5 mg by mouth 3 (three) times daily as needed for muscle spasms.     diclofenac sodium (VOLTAREN) 1 % GEL Apply 1 application topically daily as needed (for pain). Apply as directed     dicyclomine (BENTYL) 10 MG capsule Take 10 mg by mouth 3 (three) times daily as needed for spasms.     diphenhydrAMINE (BENADRYL) 25 MG tablet Take 25 mg by mouth every 6 (six) hours as needed for allergies.     diphenoxylate-atropine (LOMOTIL) 2.5-0.025 MG tablet Take 2 tablets by mouth 4 (four) times daily as needed for diarrhea or loose stools.     doxycycline (VIBRA-TABS) 100 MG tablet Take 100 mg by mouth 2 (two) times daily.     DULoxetine (CYMBALTA) 60 MG capsule Take 120 mg by mouth daily.      EPINEPHrine (EPIPEN 2-PAK) 0.3 mg/0.3 mL IJ SOAJ injection See admin instructions.     famotidine (PEPCID) 20 MG tablet Take 20 mg by mouth at bedtime.     fexofenadine (ALLEGRA) 180 MG tablet Take 180 mg by mouth daily.     fluticasone (FLONASE) 50 MCG/ACT nasal spray Place 1 spray into both nostrils daily.     Nutritional Supplements (JUICE PLUS FIBRE PO) Take 2 each by mouth daily. Fruits and Vegetables      Omega-3 Fatty Acids (FISH OIL) 1000 MG CAPS Take 1,000 mg by mouth daily.      ondansetron (ZOFRAN) 4 MG tablet Take 4 mg by mouth every 8 (eight) hours as needed for nausea or vomiting.     oxyCODONE (OXY IR/ROXICODONE) 5 MG immediate release tablet Take 1 tablet (5 mg total) by mouth every 6 (six) hours as needed for severe pain. 15 tablet 0   pantoprazole (PROTONIX) 40 MG tablet Take 40 mg by mouth daily.      Phenylephrine-APAP-Guaifenesin (TYLENOL SINUS SEVERE PO) Take 2 tablets by mouth daily as needed (for congestion).     polyvinyl alcohol (LIQUIFILM TEARS) 1.4 % ophthalmic solution Place 1 drop into both eyes as needed for dry eyes.     Probiotic Product (PROBIOTIC & ACIDOPHILUS EX ST PO) Take 1 capsule by mouth daily. Ultra Flora Plus Capsules     promethazine-dextromethorphan (PROMETHAZINE-DM) 6.25-15 MG/5ML syrup Take 5 mLs by mouth every 6 (six) hours as needed.     Propylene Glycol (SYSTANE BALANCE) 0.6 % SOLN Place 1 drop into both eyes daily.     rOPINIRole (REQUIP) 0.5 MG tablet Take 0.5 mg by mouth at bedtime.     simvastatin (ZOCOR) 10 MG tablet Take 10 mg by mouth at bedtime.       sucralfate (CARAFATE) 1 GM/10ML suspension Take 1 g by mouth 4 (four) times daily -  with meals and at bedtime.     traZODone (DESYREL) 50 MG tablet Take 50 mg by mouth at bedtime.     vitamin E 400 UNIT capsule Take 400 Units by mouth daily.       No current facility-administered medications for this  visit.    REVIEW OF SYSTEMS:   Constitutional: Denies fevers, chills or abnormal night sweats Eyes: Denies blurriness of vision, double vision or watery eyes Ears, nose, mouth, throat, and face: Denies mucositis or sore throat Respiratory: Denies cough, dyspnea or wheezes Cardiovascular: Denies palpitation, chest discomfort or lower extremity swelling Gastrointestinal:  Denies nausea, heartburn or change in bowel habits Skin: Denies abnormal skin rashes Lymphatics: Denies new lymphadenopathy or  easy bruising Neurological:Denies numbness, tingling or new weaknesses Behavioral/Psych: Mood is stable, no new changes  All other systems were reviewed with the patient and are negative.  PHYSICAL EXAMINATION: ECOG PERFORMANCE STATUS: 0 - Asymptomatic  Vitals:   11/18/21 1425  BP: 126/75  Pulse: (!) 59  Resp: 18  Temp: 97.6 F (36.4 C)  SpO2: 98%     Filed Weights   11/18/21 1425  Weight: 205 lb 6.4 oz (93.2 kg)      GENERAL:alert, no distress and comfortable SKIN: skin color, texture, turgor are normal, no rashes or significant lesions EYES: normal, conjunctiva are pink and non-injected, sclera clear OROPHARYNX:no exudate, no erythema and lips, buccal mucosa, and tongue normal  NECK: supple, thyroid normal size, non-tender, without nodularity LYMPH:  no palpable lymphadenopathy in the cervical, axillary or inguinal LUNGS: clear to auscultation and percussion with normal breathing effort HEART: regular rate & rhythm and no murmurs and no lower extremity edema ABDOMEN:abdomen soft, non-tender and normal bowel sounds Musculoskeletal:no cyanosis of digits and no clubbing  PSYCH: alert & oriented x 3 with fluent speech NEURO: no focal motor/sensory deficits BREASTS: No palpable mass in either breast.  She has a healing incision of the central right breast.  LABORATORY DATA:  I have reviewed the data as listed Lab Results  Component Value Date   WBC 6.6 07/28/2021   HGB 13.6 07/28/2021   HCT 42.1 07/28/2021   MCV 89.4 07/28/2021   PLT 266 07/28/2021   Lab Results  Component Value Date   NA 140 07/28/2021   K 4.3 07/28/2021   CL 105 07/28/2021   CO2 27 07/28/2021    RADIOGRAPHIC STUDIES: No results found.

## 2021-12-29 ENCOUNTER — Telehealth: Payer: Self-pay | Admitting: *Deleted

## 2021-12-29 NOTE — Chronic Care Management (AMB) (Signed)
  Care Coordination  Outreach Note  12/29/2021 Name: Doris Reyes MRN: [id number] DOB: [birth year]   Care Coordination Outreach Attempts: An unsuccessful telephone outreach was attempted today to offer the patient information about available care coordination services as a benefit of their health plan.   Follow Up Plan:  Additional outreach attempts will be made to offer the patient care coordination information and services.   Encounter Outcome:  No Answer  [place]  Direct Dial: [phone]

## 2022-01-03 NOTE — Chronic Care Management (AMB) (Signed)
  Care Coordination  Outreach Note  01/03/2022 Name: Doris Reyes MRN: [id number] DOB: 06/20/1950   Care Coordination Outreach Attempts: A second unsuccessful outreach was attempted today to offer the patient with information about available care coordination services as a benefit of their health plan.     Follow Up Plan:  Additional outreach attempts will be made to offer the patient care coordination information and services.   Encounter Outcome:  No Answer  [place]  Direct Dial: [phone]

## 2022-01-07 DIAGNOSIS — M79672 Pain in left foot: Secondary | ICD-10-CM | POA: Diagnosis not present

## 2022-01-07 NOTE — Chronic Care Management (AMB) (Signed)
  Care Coordination  Outreach Note  01/07/2022 Name: MARLISHA VANWYK MRN: [id number] DOB: 10/20/1950   Care Coordination Outreach Attempts: A third unsuccessful outreach was attempted today to offer the patient with information about available care coordination services as a benefit of their health plan.   Follow Up Plan:  No further outreach attempts will be made at this time. We have been unable to contact the patient to offer or enroll patient in care coordination services  Encounter Outcome:  No Answer  [place]: [phone]

## 2022-01-17 DIAGNOSIS — M79672 Pain in left foot: Secondary | ICD-10-CM | POA: Diagnosis not present

## 2022-01-20 DIAGNOSIS — I959 Hypotension, unspecified: Secondary | ICD-10-CM | POA: Diagnosis not present

## 2022-01-20 DIAGNOSIS — R609 Edema, unspecified: Secondary | ICD-10-CM | POA: Diagnosis not present

## 2022-01-20 DIAGNOSIS — L299 Pruritus, unspecified: Secondary | ICD-10-CM | POA: Diagnosis not present

## 2022-01-20 DIAGNOSIS — R069 Unspecified abnormalities of breathing: Secondary | ICD-10-CM | POA: Diagnosis not present

## 2022-01-20 DIAGNOSIS — T7840XA Allergy, unspecified, initial encounter: Secondary | ICD-10-CM | POA: Diagnosis not present

## 2022-01-20 DIAGNOSIS — R220 Localized swelling, mass and lump, head: Secondary | ICD-10-CM | POA: Diagnosis not present

## 2022-01-20 DIAGNOSIS — T781XXA Other adverse food reactions, not elsewhere classified, initial encounter: Secondary | ICD-10-CM | POA: Diagnosis not present

## 2022-01-31 ENCOUNTER — Telehealth: Payer: Self-pay | Admitting: *Deleted

## 2022-01-31 NOTE — Progress Notes (Signed)
  Care Coordination   Note   01/31/2022 Name: KINZEE HAPPEL MRN: [id number] DOB: [birth year]  AMAKA GLUTH is a 71 y.o. year old female who sees [name], MD for primary care.  [name] by phone today to return call about care coordination services.  Ms. Heffler was given information about Care Coordination services today including:   The Care Coordination services include support from the care team which includes your Nurse Coordinator, Clinical Social Worker, or Pharmacist.  The Care Coordination team is here to help remove barriers to the health concerns and goals most important to you. Care Coordination services are voluntary, and the patient may decline or stop services at any time by request to their care team member.   Care Coordination Consent Status: Patient agreed to services and verbal consent obtained.   Follow up plan:  Telephone appointment with care coordination team member scheduled for:  02/01/22  Encounter Outcome:  Pt. Scheduled  [place]  Direct Dial: [phone]

## 2022-02-01 ENCOUNTER — Ambulatory Visit: Payer: Self-pay | Admitting: *Deleted

## 2022-02-01 NOTE — Patient Outreach (Signed)
  Care Coordination   Initial Visit Note   02/01/2022 Name: Doris Reyes MRN: [id number] DOB: [birth year]  Doris Reyes is a 71 y.o. year old female who sees Doris Frees, MD for primary care. I spoke with  Doris Reyes by phone today.  What matters to the patients health and wellness today?  Grief support after losing husband on 01/12/2022    Goals Addressed             This Visit's Progress    Grief support for recent loss of husband       Care Coordination Interventions: Pt shared with CSW she recently lost her husband of 28 years- less than a month ago Pt with grief, sadness, loneliness and situational Depression screen reviewed  PHQ2/ PHQ9 completed Solution-Focused Strategies employed:  CSW discussed the overall impact grief and loss/depression can cause to the physical, emotional and health wellness and the importance of allowing ourselves to grieve; cry, etc.  Mindfulness or Relaxation training provided Active listening / Reflection utilized  Emotional Support Provided Provided general psycho-education for mental health needs  Reviewed mental health medications and discussed importance of compliance: Pt reports taking Cymbalta for her Fibromyalgia and Xanax PRN Participation in counseling encouraged : Pt plans to seek providers in-network with her [place] insurance- as well as consider local hospice offerings Participation in support group encouraged : Pt aware of the local hospice agency programs and use to volunteer there- she indicates new group classes will start in January- encouraged pt to look into programs they may be offering over the holiday season that may be helpful  Suicidal Ideation/Homicidal Ideation assessed:denies SI/HI Encouraged pt to continue to reach out and engage with friends, family and church friends who are supportive and she states; "my people have kept me busy and helped me get through this so far" Reminded pt of the 39 # for  non-emergent mental health support           SDOH assessments and interventions completed:  Yes  SDOH Interventions Today    Flowsheet Row Most Recent Value  SDOH Interventions   Food Insecurity Interventions Intervention Not Indicated  Housing Interventions Intervention Not Indicated  Transportation Interventions Other (Comment), Patient Resources (Friends/Family)  [counseling- plans to call insurance to check in-network benefits]  Utilities Interventions Intervention Not Indicated  Depression Interventions/Treatment  Medication, Counseling  Financial Strain Interventions Intervention Not Indicated        Care Coordination Interventions Activated:  Yes  Care Coordination Interventions:  Yes, provided   Follow up plan: Follow up call scheduled for 02/15/22    Encounter Outcome:  Pt. Visit Completed   Doris Reyes MSW, LCSW Licensed Clinical Social Worker      [phone]

## 2022-02-01 NOTE — Patient Instructions (Signed)
Visit Information  Thank you for taking time to visit with me today. Please don't hesitate to contact me if I can be of assistance to you.   Following are the goals we discussed today:   Goals Addressed             This Visit's Progress    Grief support for recent loss of husband       Care Coordination Interventions: Pt shared with CSW she recently lost her husband of 28 years- less than a month ago Pt with grief, sadness, loneliness and situational Depression screen reviewed  PHQ2/ PHQ9 completed Solution-Focused Strategies employed:  CSW discussed the overall impact grief and loss/depression can cause to the physical, emotional and health wellness and the importance of allowing ourselves to grieve; cry, etc.  Mindfulness or Relaxation training provided Active listening / Reflection utilized  Emotional Support Provided Provided general psycho-education for mental health needs  Reviewed mental health medications and discussed importance of compliance: Pt reports taking Cymbalta for her Fibromyalgia and Xanax PRN Participation in counseling encouraged : Pt plans to seek providers in-network with her [place] insurance- as well as consider local hospice offerings Participation in support group encouraged : Pt aware of the local hospice agency programs and use to volunteer there- she indicates new group classes will start in January- encouraged pt to look into programs they may be offering over the holiday season that may be helpful  Suicidal Ideation/Homicidal Ideation assessed:denies SI/HI Encouraged pt to continue to reach out and engage with friends, family and church friends who are supportive and she states; "my people have kept me busy and helped me get through this so far" Reminded pt of the 988 # for non-emergent mental health support           Our next appointment is by telephone on 1121/23   Please call the care guide team at [phone] if you need to cancel or  reschedule your appointment.   If you are experiencing a Mental Health or [place] or need someone to talk to, please call the Suicide and Crisis Lifeline: 988 call 911   Patient verbalizes understanding of instructions and care plan provided today and agrees to view in [place]. Active MyChart status and patient understanding of how to access instructions and care plan via MyChart confirmed with patient.     Telephone follow up appointment with care management team member scheduled for:02/15/22 Eduard Clos MSW, LCSW Licensed Clinical Social Worker      [phone]

## 2022-02-07 DIAGNOSIS — R7303 Prediabetes: Secondary | ICD-10-CM | POA: Diagnosis not present

## 2022-02-07 DIAGNOSIS — F419 Anxiety disorder, unspecified: Secondary | ICD-10-CM | POA: Diagnosis not present

## 2022-02-07 DIAGNOSIS — G259 Extrapyramidal and movement disorder, unspecified: Secondary | ICD-10-CM | POA: Diagnosis not present

## 2022-02-07 DIAGNOSIS — K219 Gastro-esophageal reflux disease without esophagitis: Secondary | ICD-10-CM | POA: Diagnosis not present

## 2022-02-07 DIAGNOSIS — J309 Allergic rhinitis, unspecified: Secondary | ICD-10-CM | POA: Diagnosis not present

## 2022-02-07 DIAGNOSIS — H6123 Impacted cerumen, bilateral: Secondary | ICD-10-CM | POA: Diagnosis not present

## 2022-02-07 DIAGNOSIS — I10 Essential (primary) hypertension: Secondary | ICD-10-CM | POA: Diagnosis not present

## 2022-02-07 DIAGNOSIS — M797 Fibromyalgia: Secondary | ICD-10-CM | POA: Diagnosis not present

## 2022-02-07 DIAGNOSIS — F5101 Primary insomnia: Secondary | ICD-10-CM | POA: Diagnosis not present

## 2022-02-07 DIAGNOSIS — E7800 Pure hypercholesterolemia, unspecified: Secondary | ICD-10-CM | POA: Diagnosis not present

## 2022-02-07 DIAGNOSIS — J329 Chronic sinusitis, unspecified: Secondary | ICD-10-CM | POA: Diagnosis not present

## 2022-02-07 DIAGNOSIS — B9689 Other specified bacterial agents as the cause of diseases classified elsewhere: Secondary | ICD-10-CM | POA: Diagnosis not present

## 2022-02-09 ENCOUNTER — Inpatient Hospital Stay: Payer: Medicare Other | Attending: Oncology | Admitting: Oncology

## 2022-02-09 ENCOUNTER — Telehealth: Payer: Self-pay | Admitting: Oncology

## 2022-02-09 ENCOUNTER — Encounter: Payer: Self-pay | Admitting: Oncology

## 2022-02-09 VITALS — BP 124/68 | HR 59 | Temp 97.7°F | Resp 18 | Ht 63.0 in | Wt 208.1 lb

## 2022-02-09 DIAGNOSIS — M810 Age-related osteoporosis without current pathological fracture: Secondary | ICD-10-CM

## 2022-02-09 DIAGNOSIS — Z1589 Genetic susceptibility to other disease: Secondary | ICD-10-CM

## 2022-02-09 DIAGNOSIS — C50111 Malignant neoplasm of central portion of right female breast: Secondary | ICD-10-CM | POA: Diagnosis not present

## 2022-02-09 NOTE — Progress Notes (Signed)
Face to face with pt in [place]. Pt lost her husband on Oct.18th,2023. Pt is her for Med Onc follow up.. Pt reports that she is doing well with her cancer recovery. She is not going to take the Aromatase Inhibitor. She has completed her radiation and did well with that. Pt reports that her hardest issue right now is dealing with the loss of her husband. Encouraged pt to call with questions or concerns.

## 2022-02-09 NOTE — Progress Notes (Signed)
Patient: Doris Reyes           Date of Birth: [birth year]           MRN: [id number] Visit Date: 02/09/2022 PCP: [name], MD     Cervical Exam Exam not completed.  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[place]) 06/08/2021    Class: Diagnosis of   GERD (gastroesophageal reflux disease) 12/11/2014   Fibromyalgia 12/11/2014   Allergic rhinitis 12/11/2014   Cough 12/11/2014   Generalized anxiety disorder 12/11/2014   Hyperlipidemia 12/11/2014   Vitamin D deficiency 12/11/2014   Esophageal spasm 12/11/2014   IBS (irritable bowel syndrome) 12/11/2014   Essential hypertension 12/11/2014   History of colonic polyps 12/11/2014   OSA (obstructive sleep apnea) 12/11/2014   Vomiting 10/06/2010   Past Medical History:  Diagnosis Date   Anxiety    Cancer ([place])    right breast ILC   Complication of anesthesia    difficulty waking up   Depression    Diverticulitis    Family history of breast cancer    Family history of ovarian cancer    Family history of prostate cancer    Fibromyalgia    GERD (gastroesophageal reflux disease)    History of colonic polyps    Hyperlipidemia    Hypertension    IBS (irritable bowel syndrome)    Ischemic colitis ([place]) 03/2012   Osteoporosis    RLS (restless legs syndrome)    Sinusitis    Sleep apnea    CPAP nightly    Family History  Problem Relation Age of Onset   Colon cancer Mother 57   Alzheimer's disease Mother    Heart attack Father    Cancer Maternal Aunt        x2   Alzheimer's disease Maternal Aunt    Ovarian cancer Maternal Aunt    Prostate cancer Maternal Uncle    Kidney disease Maternal  Uncle    Atrial fibrillation Maternal Uncle    Stroke Paternal Uncle    Aneurysm Paternal Grandmother        brain   Stroke Paternal Grandfather    Prostate cancer Cousin        paternal first cousin   Prostate cancer Cousin        paternal first cousin   Esophageal cancer Cousin        maternal first cousin   Breast cancer Cousin        DCIS, maternal first cousin   Ovarian cancer Cousin        maternal first cousin   Rheum arthritis Other    Lung disease Neg Hx     Social History   Occupational History   Occupation: disabled  Tobacco Use   Smoking status: Never   Smokeless tobacco: Never   Tobacco comments:    brief exposure through her husband  Vaping Use   Vaping Use: Never used  Substance and Sexual Activity   Alcohol use: Never    Alcohol/week: 0.0 standard drinks of alcohol   Drug use: Never   Sexual activity: Not Currently

## 2022-02-09 NOTE — Progress Notes (Signed)
[place] 02/08/22  Patient Care Team: [name], MD as PCP - General (Family Medicine) [name], MD as Consulting Physician (Gastroenterology) [name], RN as Registered Nurse [name] [name], LCSW as Social Worker  Assessment & Plan    Invasive lobular carcinoma This was found on screening mammogram but the MRI reveals focal enhancement surrounding the biopsy clip up to 2.3 cm and there was non-mass enhancement posterior to this known malignancy on MRI.  This area was biopsied in order to guide her ultimate surgery.This is strongly ER/PR positive, HER2 negative and has a low Ki 67 of less than 5%.  She has now had a lumpectomy but had to go back for reexcision of the margins.  The sentinel lymph node was negative but she did have 2 primaries, measuring 2.4 cm and 2.2 cm, for a T2 N0 M0, stage IIA.  Even though she has several favorable characteristics, such as grade 1 histology and a low Ki 67, I still recommended that we pursue Endopredict testing to quantitate her risk for recurrence, and fortunately she has an EpClin score of 2.6, low risk.  This correlates with a 5.1% risk of distant recurrence in the next 10 years. Her benefit from chemotherapy would be 1.0% so obviously we will not pursue that. Her risk for a late recurrence is 4.0%. Unfortunately she has refused hormonal therapy.  Strong family history Including multiple females with breast cancer, her mother had colon cancer at age 71, and another maternal aunt had ovarian cancer.  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  She has been counseled on the implications of this and is already getting regular colonoscopy.  However the information will also be passed on to family members to pursue testing.  Osteoporosis Her spine shows a T score of -2.6 for osteoporosis and the right femur has osteopenia.  At the very least she should be taking calcium and vitamin  D.   I recommend calcium and vitamin D and we discussed other interventions for her osteoporosis. She has refused hormonal therapy. I offered support for her grieving after recently losing her husband. I will see her back in 3 months. I discussed the assessment and treatment plan with the patient.  She was provided an opportunity to ask questions and all were answered.  The patient was advised to call back if she has further questions   I provided 20 minutes of face-to-face time during this this encounter and > 50% was spent counseling as documented under my assessment and plan.    [name], MD [facility] [address] [place] [address] Dept: [phone] Dept Fax: [phone]     CHIEF COMPLAINTS/PURPOSE OF CONSULTATION:  Breast cancer, stage IIA  HISTORY OF PRESENTING ILLNESS:  Doris Reyes 71 y.o. 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  She does have annual mammograms because of her strong family history, but was on hormone replacement therapy for 10 years.  She had a screening mamm[facility] at [facility] on May 05, 2021 which revealed architectural distortion which was felt to be indeterminant.  She had a diagnostic mammogram on March 3 which showed mild nipple retraction and a 1 cm area of focal asymmetry in the retroareolar region.  Ultrasound revealed a hypoechoic irregular mass measuring 9 mm in that area.  On March 14 she had a biopsy performed which revealed a 0.9 cm irregular mass with spiculated margins in the right breast at 12:00, approximately 2 cm from the nipple and anteriorly in the breast.  This was found to be grade 2 invasive mammary carcinoma as well as mammary carcinoma in situ.  The E-cadherin immunohistochemistry stains were negative and so this is consistent with invasive lobular carcinoma.  The  estrogen receptors were positive at 95% and progesterone receptors positive at 95% with HER2 by immunohistochemistry positive at 2+ but FISH was negative.  Ki-67 was less than 5%.  The carcinoma in situ was positive for E-cadherin, consistent with ductal carcinoma in situ.  She had an MRI of the breast and was found to have focal enhancement in the right breast surrounding the biopsy clip and some non-mass enhancement posterior to the malignancy.  It was recommended that she have an MRI guided biopsy of the clumped non-mass enhancement area and this revealed invasive lobular carcinoma grade 1 with lobular carcinoma in situ and extensive fibrocystic changes with focal intraluminal microcalcification and intraductal papillomatosis.  Due to her strong family history including multiple females with breast cancer in her mother with colon cancer at age 43, she did have genetic testing with [facility] and was found to have a carrier mutation of MUTYH.  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  It was recommended that she have colonoscopy beginning at age 30 or 58 years prior to the age of her first-degree use relatives age at colorectal cancer diagnosis.  It was recommended this be repeated every 5 years.  This was explained to her.  She has now had a lumpectomy but had to go back for reexcision of the margins.  The sentinel lymph node was negative but she did have 2 primaries, measuring 2.4 cm and 2.2 cm, for a T2 N0 M0, stage IIA. This was ER/PR positive, HER 2 negative and a Ki-67 was 5%.  Even though she has several favorable characteristics, such as grade 1 histology and a low Ki 67, I still recommended that we pursue Endopredict testing to quantitate her risk for recurrence, and fortunately she has an EpClin score of 2.6, low risk. This correlates with a 5.1% risk of distant recurrence in the  next 10 years, with only a 1.0% benefit of chemotherapy. Her risk of late recurrence is 4.0%. I recommended hormonal therapy but she refused.  I reviewed her records extensively and collaborated the history with the patient.  SUMMARY OF ONCOLOGIC HISTORY: Oncology History  Cancer of central portion of right breast ([place])  06/08/2021 Initial Diagnosis   Breast cancer in female [facility])   07/23/2021 Genetic Testing   Negative hereditary cancer genetic testing: no pathogenic variants detected in [facility] BRCAPlus Panel. Report date is July 23, 2021.  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  The report date is Jul 26, 2021.  The BRCAplus panel offered by [employer] and includes sequencing and deletion/duplication analysis for the following 8 genes: ATM, BRCA1, BRCA2, CDH1, CHEK2, PALB2, PTEN, and TP53.  Results of pan-cancer panel pending.   The CancerNext-Expanded gene panel offered by [facility] and includes sequencing and rearrangement analysis for the following 77 genes: AIP, ALK, APC*, ATM*, AXIN2, BAP1, BARD1, BLM, BMPR1A, BRCA1*,  BRCA2*, BRIP1*, CDC73, CDH1*, CDK4, CDKN1B, CDKN2A, CHEK2*, CTNNA1, DICER1, FANCC, FH, FLCN, GALNT12, KIF1B, LZTR1, MAX, MEN1, MET, MLH1*, MSH2*, MSH3, MSH6*, MUTYH*, NBN, NF1*, NF2, NTHL1, PALB2*, PHOX2B, PMS2*, POT1, PRKAR1A, PTCH1, PTEN*, RAD51C*, RAD51D*, RB1, RECQL, RET, SDHA, SDHAF2, SDHB, SDHC, SDHD, SMAD4, SMARCA4, SMARCB1, SMARCE1, STK11, SUFU, TMEM127, TP53*, TSC1, TSC2, VHL and XRCC2 (sequencing and deletion/duplication); EGFR, EGLN1, HOXB13, KIT, MITF, PDGFRA, POLD1, and POLE (sequencing only); EPCAM and GREM1 (deletion/duplication only). DNA and RNA analyses performed for * genes.    08/23/2021 Cancer Staging   Staging form: Breast, AJCC 8th Edition - Pathologic stage from 08/23/2021: Stage IA (pT2(2), pN0(sn), cM0, G1,  ER+, PR+, HER2-) - Signed by [name], MD on 09/29/2021 Histopathologic type: Lobular carcinoma, NOS Stage prefix: Initial diagnosis Method of lymph node assessment: Sentinel lymph node biopsy Nuclear grade: G1 Multigene prognostic tests performed: EndoPredict Histologic grading system: 3 grade system Residual tumor (R): R0 - None Laterality: Right Tumor size (mm): 24 Multiple tumors: Yes Number of tumors: 2 Lymph-vascular invasion (LVI): LVI not present (absent)/not identified Diagnostic confirmation: Positive histology PLUS positive immunophenotyping and/or positive genetic studies Specimen type: Excision Staged by: Managing physician Menopausal status: Postmenopausal Ki-67 (%): 5 Stage used in treatment planning: Yes National guidelines used in treatment planning: Yes Type of national guideline used in treatment planning: NCCN     In terms of breast cancer risk profile:  She menarched at early age of 18-13 and went to surgical menopause at age 17 She had 1 pregnancy, her first child was born at age 77 She was exposed hormone replacement therapy for 10 years in the form of a patch.  She has significant positive family history of Breast and ovarian and colon cancer  INTERVAL HISTORY: Mkayla had 2 tumors of lobular carcinoma measuring 2.4 cm and 2.2 cm. She had extensive lobular neoplasia, i.e. atypical lobular hyperplasia.  The sentinel lymph node was negative.  So this was staged as a T2 N0 M0 with 2 primaries.  She went back for surgery on May 25 to have reexcision of the margins and all were negative.  She had a bone density scan in April and she was found to have osteoporosis of the spine with a T score of -2.6.  She had osteopenia of the right femur with a T score of -1.3.  She has been instructed to take calcium and vitamin D..  She is up-to-date on colonoscopy. I met with her earlier this year to discuss hormonal therapy as well as treatment of her osteoporosis.  I  recommended an aromatase inhibitor and reviewed the rationale, schedule and potential toxicities. She declined to take this. She has recently had an abnormal pap smear and will have vaginoscopy with biopsy by Dr. Vale Haven.   Her husband recently died at [facility] and she had tripped over his oxygen tubing and injured her left foot. She still has pain at a 5/10. She is on an antibiotic for a sinus infection. Her appetite is fair. Her weight is stable when corrected for the boot of her left foot. She denies cough, shortness of breath, hemoptysis, chest pain or wheezing.  She denies nausea, vomiting, bowel problems or abdominal pain.  She denies fevers, chills or night sweats.  MEDICAL HISTORY:  Past Medical History:  Diagnosis Date   Anxiety    Cancer ([place])    right breast ILC   Complication of anesthesia    difficulty waking up   Depression    Diverticulitis  Family history of breast cancer    Family history of ovarian cancer    Family history of prostate cancer    Fibromyalgia    GERD (gastroesophageal reflux disease)    History of colonic polyps    Hyperlipidemia    Hypertension    IBS (irritable bowel syndrome)    Ischemic colitis ([place]) 03/2012   Osteoporosis    RLS (restless legs syndrome)    Sinusitis    Sleep apnea    CPAP nightly  Vitamin D deficiency Iron deficiency treated with IV iron infusions in 2019  SURGICAL HISTORY: Past Surgical History:  Procedure Laterality Date   BREAST LUMPECTOMY WITH RADIOACTIVE SEED LOCALIZATION Right 08/04/2021   Procedure: RIGHT BREAST LUMPECTOMY WITH RADIOACTIVE SEED LOCALIZATION;  Surgeon: Erroll Luna, MD;  Location: [place];  Service: General;  Laterality: Right;   CHOLECYSTECTOMY  2004   COLONOSCOPY WITH PROPOFOL N/A 04/18/2017   Procedure: COLONOSCOPY WITH PROPOFOL;  Surgeon: [name], MD;  Location: [facility];  Service: Endoscopy;  Laterality: N/A;   ESOPHAGOGASTRODUODENOSCOPY (EGD) WITH PROPOFOL N/A 04/18/2017    Procedure: ESOPHAGOGASTRODUODENOSCOPY (EGD) WITH PROPOFOL;  Surgeon: [name], MD;  Location: [facility];  Service: Endoscopy;  Laterality: N/A;   FLEXIBLE SIGMOIDOSCOPY  04/20/2012   Procedure: FLEXIBLE SIGMOIDOSCOPY;  Surgeon: Beryle Beams, MD;  Location: [facility];  Service: Endoscopy;  Laterality: N/A;   NISSEN FUNDOPLICATION  [id number]   PARAESOPHAGEAL HERNIA REPAIR  09/11/2009   and Nissen fundoplication   RE-EXCISION OF BREAST LUMPECTOMY Right 08/18/2021   Procedure: RE-EXCISION RIGHT BREAST LUMPECTOMY;  Surgeon: Erroll Luna, MD;  Location: [place];  Service: General;  Laterality: Right;   SENTINEL NODE BIOPSY N/A 08/04/2021   Procedure: SENTINEL NODE BIOPSY;  Surgeon: Erroll Luna, MD;  Location: [place];  Service: General;  Laterality: N/A;   TONSILLECTOMY  1960's   TOTAL ABDOMINAL HYSTERECTOMY  2001  She had 2 large benign tumors of the ovaries removed with bilateral salpingo oophorectomy  SOCIAL HISTORY: Social History   Socioeconomic History   Marital status: Widowed    Spouse name: Jeneen Rinks   Number of children: 1   Years of education: Not on file   Highest education level: Some college, no degree  Occupational History   Occupation: disabled  Tobacco Use   Smoking status: Never   Smokeless tobacco: Never   Tobacco comments:    brief exposure through her husband  Vaping Use   Vaping Use: Never used  Substance and Sexual Activity   Alcohol use: Never    Alcohol/week: 0.0 standard drinks of alcohol   Drug use: Never   Sexual activity: Not Currently  Other Topics Concern   Not on file  Social History Narrative   From [place] originally. Previously lived in [place]. Previously worked at [employer] also in a [employer]. Has also worked as a Network engineer for [employer]. No pets currently. No bird exposure. No known mold in her current home.    Lives with husband   Caffeine- coffee 2 c daily   Social Determinants of Health   Financial  Resource Strain: Low Risk  (02/01/2022)   Overall Financial Resource Strain (CARDIA)    Difficulty of Paying Living Expenses: Not hard at all  Food Insecurity: No Food Insecurity (02/01/2022)   Hunger Vital Sign    Worried About Running Out of Food in the Last Year: Never true    Ran Out of Food in the Last Year: Never true  Transportation Needs: No Transportation  Needs (02/01/2022)   PRAPARE - Hydrologist (Medical): No    Lack of Transportation (Non-Medical): No  Physical Activity: Not on file  Stress: Not on file  Social Connections: Not on file  Intimate Partner Violence: Not At Risk (02/01/2022)   Humiliation, Afraid, Rape, and Kick questionnaire    Fear of Current or Ex-Partner: No    Emotionally Abused: No    Physically Abused: No    Sexually Abused: No   The patient is here with her husband [name] and daughter Museum/gallery conservator today.  She has never smoked and does not drink alcohol or use drugs. FAMILY HISTORY: Family History  Problem Relation Age of Onset   Colon cancer Mother 13   Alzheimer's disease Mother    Heart attack Father    Cancer Maternal Aunt        x2   Alzheimer's disease Maternal Aunt    Ovarian cancer Maternal Aunt    Prostate cancer Maternal Uncle    Kidney disease Maternal Uncle    Atrial fibrillation Maternal Uncle    Stroke Paternal Uncle    Aneurysm Paternal Grandmother        brain   Stroke Paternal Grandfather    Prostate cancer Cousin        paternal first cousin   Prostate cancer Cousin        paternal first cousin   Esophageal cancer Cousin        maternal first cousin   Breast cancer Cousin        DCIS, maternal first cousin   Ovarian cancer Cousin        maternal first cousin   Rheum arthritis Other    Lung disease Neg Hx   Ovarian cancer                                               maternal aunt                         40's Breast cancer                                                 cousin                                      68's Prostate cancer                                              maternal uncle                        36's  ALLERGIES:  is allergic to ceftin [cefuroxime axetil], cefuroxime, pneumococcal vac polyvalent, pneumococcal vaccine, tramadol, and tramadol hcl.  MEDICATIONS:  Current Outpatient Medications  Medication Sig Dispense Refill   acetaminophen (TYLENOL) 650 MG CR tablet 1,300 mg in the morning and at bedtime.     ALPRAZolam (XANAX) 0.25 MG tablet Take 0.25 mg by mouth at bedtime  as needed for anxiety.     amLODipine (NORVASC) 5 MG tablet Take 5 mg by mouth daily.     Ascorbic Acid (VITAMIN C) 500 MG CHEW Chew 500 mg by mouth daily.     aspirin EC 81 MG tablet Take 81 mg by mouth daily. Swallow whole.     atenolol (TENORMIN) 50 MG tablet Take 75 mg by mouth daily.      benazepril (LOTENSIN) 10 MG tablet Take 10 mg by mouth daily.     benzonatate (TESSALON) 100 MG capsule Take 100-200 mg by mouth at bedtime.     Biotin 1000 MCG tablet Take 1,000 mcg by mouth daily.     Calcium Citrate-Vitamin D (CITRACAL + D PO) Take 1 tablet by mouth daily.     cetirizine (ZYRTEC) 10 MG tablet Take 10 mg by mouth daily as needed for allergies.      Cholecalciferol (VITAMIN D) 1000 UNITS capsule Take 2,000 Units by mouth daily.      cyclobenzaprine (FLEXERIL) 5 MG tablet Take 5 mg by mouth 3 (three) times daily as needed for muscle spasms.     diclofenac sodium (VOLTAREN) 1 % GEL Apply 1 application topically daily as needed (for pain). Apply as directed     dicyclomine (BENTYL) 10 MG capsule Take 10 mg by mouth 3 (three) times daily as needed for spasms.     diphenhydrAMINE (BENADRYL) 25 MG tablet Take 25 mg by mouth every 6 (six) hours as needed for allergies.     diphenoxylate-atropine (LOMOTIL) 2.5-0.025 MG tablet Take 2 tablets by mouth 4 (four) times daily as needed for diarrhea or loose stools.     doxycycline (VIBRA-TABS) 100 MG tablet Take 100 mg by mouth 2 (two) times daily.      DULoxetine (CYMBALTA) 60 MG capsule Take 120 mg by mouth daily.      EPINEPHrine (EPIPEN 2-PAK) 0.3 mg/0.3 mL IJ SOAJ injection See admin instructions.     famotidine (PEPCID) 20 MG tablet Take 20 mg by mouth at bedtime.     fexofenadine (ALLEGRA) 180 MG tablet Take 180 mg by mouth daily.     fluticasone (FLONASE) 50 MCG/ACT nasal spray Place 1 spray into both nostrils daily.     Nutritional Supplements (JUICE PLUS FIBRE PO) Take 2 each by mouth daily. Fruits and Vegetables     Omega-3 Fatty Acids (FISH OIL) 1000 MG CAPS Take 1,000 mg by mouth daily.      ondansetron (ZOFRAN) 4 MG tablet Take 4 mg by mouth every 8 (eight) hours as needed for nausea or vomiting.     oxyCODONE (OXY IR/ROXICODONE) 5 MG immediate release tablet Take 1 tablet (5 mg total) by mouth every 6 (six) hours as needed for severe pain. 15 tablet 0   pantoprazole (PROTONIX) 40 MG tablet Take 40 mg by mouth daily.      Phenylephrine-APAP-Guaifenesin (TYLENOL SINUS SEVERE PO) Take 2 tablets by mouth daily as needed (for congestion).     polyvinyl alcohol (LIQUIFILM TEARS) 1.4 % ophthalmic solution Place 1 drop into both eyes as needed for dry eyes.     Probiotic Product (PROBIOTIC & ACIDOPHILUS EX ST PO) Take 1 capsule by mouth daily. Ultra Flora Plus Capsules     promethazine-dextromethorphan (PROMETHAZINE-DM) 6.25-15 MG/5ML syrup Take 5 mLs by mouth every 6 (six) hours as needed.     Propylene Glycol (SYSTANE BALANCE) 0.6 % SOLN Place 1 drop into both eyes daily.     rOPINIRole (REQUIP) 0.5 MG tablet  Take 0.5 mg by mouth at bedtime.     simvastatin (ZOCOR) 10 MG tablet Take 10 mg by mouth at bedtime.       sucralfate (CARAFATE) 1 GM/10ML suspension Take 1 g by mouth 4 (four) times daily -  with meals and at bedtime.     traZODone (DESYREL) 50 MG tablet Take 50 mg by mouth at bedtime.     vitamin E 400 UNIT capsule Take 400 Units by mouth daily.       No current facility-administered medications for this visit.    REVIEW OF  SYSTEMS:   Constitutional: Denies fevers, chills or abnormal night sweats Eyes: Denies blurriness of vision, double vision or watery eyes Ears, nose, mouth, throat, and face: Denies mucositis or sore throat Respiratory: Denies cough, dyspnea or wheezes Cardiovascular: Denies palpitation, chest discomfort or lower extremity swelling Gastrointestinal:  Denies nausea, heartburn or change in bowel habits Skin: Denies abnormal skin rashes Lymphatics: Denies new lymphadenopathy or easy bruising Neurological:Denies numbness, tingling or new weaknesses Behavioral/Psych: Mood is stable, no new changes  All other systems were reviewed with the patient and are negative.  PHYSICAL EXAMINATION: ECOG PERFORMANCE STATUS: 0 - Asymptomatic  Vitals:   02/09/22 1445  BP: 124/68  Pulse: (!) 59  Resp: 18  Temp: 97.7 F (36.5 C)  SpO2: 96%      Filed Weights   02/09/22 1445  Weight: 208 lb 1.6 oz (94.4 kg)       GENERAL:alert, no distress and comfortable SKIN: skin color, texture, turgor are normal, no rashes or significant lesions EYES: normal, conjunctiva are pink and non-injected, sclera clear OROPHARYNX:no exudate, no erythema and lips, buccal mucosa, and tongue normal  NECK: supple, thyroid normal size, non-tender, without nodularity LYMPH:  no palpable lymphadenopathy in the cervical, axillary or inguinal LUNGS: clear to auscultation and percussion with normal breathing effort HEART: regular rate & rhythm and no murmurs and no lower extremity edema ABDOMEN:abdomen soft, non-tender and normal bowel sounds Musculoskeletal:no cyanosis of digits and no clubbing  PSYCH: alert & oriented x 3 with fluent speech NEURO: no focal motor/sensory deficits BREASTS: No palpable mass in either breast.  She has a healing incision of the central right breast.  LABORATORY DATA:  I have reviewed the data as listed Lab Results  Component Value Date   WBC 6.6 07/28/2021   HGB 13.6 07/28/2021   HCT  42.1 07/28/2021   MCV 89.4 07/28/2021   PLT 266 07/28/2021   Lab Results  Component Value Date   NA 140 07/28/2021   K 4.3 07/28/2021   CL 105 07/28/2021   CO2 27 07/28/2021    RADIOGRAPHIC STUDIES: No results found.

## 2022-02-09 NOTE — Telephone Encounter (Signed)
02/09/22 Next appt scheduled and confirmed with patient

## 2022-02-15 ENCOUNTER — Ambulatory Visit: Payer: Self-pay | Admitting: *Deleted

## 2022-02-15 NOTE — Patient Instructions (Signed)
Visit Information  Thank you for taking time to visit with me today. Please don't hesitate to contact me if I can be of assistance to you.   Following are the goals we discussed today:   Goals Addressed             This Visit's Progress    Grief support for recent loss of husband       Care Coordination Interventions: Pt sharing today she is now dealing with the tragic death of her best friends' son who was killed in a homicide/suicide event.  CSW allowed pt time to share, express her emotions and is encouraged to seek a healthy balance while she is comforting her best friend.  Pt expressed feelings with CSW and CSW reflected with pt on how this may open up her wounds and may cause her to "fall backwards" in her grief process.  Pt previously shared with CSW the recent loss of her husband of 28 years in October. Pt with grief, sadness, loneliness and situational Depression screen reviewed Solution-Focused Strategies employed:  CSW discussed the overall impact grief and loss/depression can cause to the physical, emotional and health wellness and the importance of allowing ourselves to grieve; cry, etc.  Mindfulness or Relaxation training provided Active listening / Reflection utilized  Emotional Support Provided Provided general psycho-education for mental health needs  Reviewed mental health medications and discussed importance of compliance: Pt reports taking Cymbalta for her Fibromyalgia and Xanax PRN Participation in counseling encouraged : Pt plans to seek providers in-network with her [place] insurance- as well as consider local hospice offerings Participation in support group encouraged : Pt aware of the local hospice agency programs and use to volunteer there- she indicates new group classes will start in January- encouraged pt to look into programs they may be offering over the holiday season that may be helpful  Suicidal Ideation/Homicidal Ideation assessed:denies  SI/HI Encouraged pt to continue to reach out and engage with friends, family and church friends who are supportive and she states; "my people have kept me busy and helped me get through this so far" Reminded pt of the 988 # for non-emergent mental health support Pt voices appreciation of CSW outreach and supportive counseling offered          Our next appointment is by telephone on 11/3/0/23   Please call the care guide team at [phone] if you need to cancel or reschedule your appointment.   If you are experiencing a Mental Health or [place] or need someone to talk to, please call the Suicide and Crisis Lifeline: 988 call the [place] National Suicide Prevention Lifeline: (613) 754-1424 or TTY: 702 383 7943 TTY (630)791-0480) to talk to a trained counselor call 911   Patient verbalizes understanding of instructions and care plan provided today and agrees to view in [place]. Active MyChart status and patient understanding of how to access instructions and care plan via MyChart confirmed with patient.     Telephone follow up appointment with care management team member scheduled for:02/24/22  Eduard Clos MSW, LCSW Licensed Clinical Social Worker      [phone]

## 2022-02-15 NOTE — Patient Outreach (Signed)
  Care Coordination   Follow Up Visit Note   02/15/2022 Name: JENISIS HARMSEN MRN: [id number] DOB: Apr 13, 1950  REECE MCBROOM is a 71 y.o. year old female who sees [name], MD for primary care. I spoke with  [name] by phone today.  What matters to the patients health and wellness today?  Best friend's son was killed this week in a homicide/suicide.    Goals Addressed             This Visit's Progress    Grief support for recent loss of husband       Care Coordination Interventions: Pt sharing today she is now dealing with the tragic death of her best friends' son who was killed in a homicide/suicide event.  CSW allowed pt time to share, express her emotions and is encouraged to seek a healthy balance while she is comforting her best friend.  Pt expressed feelings with CSW and CSW reflected with pt on how this may open up her wounds and may cause her to "fall backwards" in her grief process.  Pt previously shared with CSW the recent loss of her husband of 28 years in October. Pt with grief, sadness, loneliness and situational Depression screen reviewed Solution-Focused Strategies employed:  CSW discussed the overall impact grief and loss/depression can cause to the physical, emotional and health wellness and the importance of allowing ourselves to grieve; cry, etc.  Mindfulness or Relaxation training provided Active listening / Reflection utilized  Emotional Support Provided Provided general psycho-education for mental health needs  Reviewed mental health medications and discussed importance of compliance: Pt reports taking Cymbalta for her Fibromyalgia and Xanax PRN Participation in counseling encouraged : Pt plans to seek providers in-network with her [place] insurance- as well as consider local hospice offerings Participation in support group encouraged : Pt aware of the local hospice agency programs and use to volunteer there- she indicates new group classes  will start in January- encouraged pt to look into programs they may be offering over the holiday season that may be helpful  Suicidal Ideation/Homicidal Ideation assessed:denies SI/HI Encouraged pt to continue to reach out and engage with friends, family and church friends who are supportive and she states; "my people have kept me busy and helped me get through this so far" Reminded pt of the 43 # for non-emergent mental health support Pt voices appreciation of CSW outreach and supportive counseling offered          SDOH assessments and interventions completed:  Yes     Care Coordination Interventions Activated:  Yes  Care Coordination Interventions:  Yes, provided   Follow up plan: Follow up call scheduled for 02/24/22    Encounter Outcome:  Pt. Visit Completed

## 2022-02-23 DIAGNOSIS — M79672 Pain in left foot: Secondary | ICD-10-CM | POA: Diagnosis not present

## 2022-02-28 ENCOUNTER — Ambulatory Visit: Payer: Self-pay | Admitting: *Deleted

## 2022-02-28 NOTE — Patient Instructions (Signed)
Visit Information  Thank you for taking time to visit with me today. Please don't hesitate to contact me if I can be of assistance to you.   Following are the goals we discussed today:   Goals Addressed             This Visit's Progress    Grief support for recent loss of husband       Care Coordination Interventions: Pt reports she was able to attend a counseling session at hospice on last Friday and it went well Pt has been going through her husband's belongings to find special things to gift to family at [place] acknowledges this made for a difficult week. Pt was invited to 4 Thanksgiving gatherings and "went to all of them"  Pt recently shared also dealing with the tragic death of her best friends' son who was killed in a homicide/suicide event.  CSW allowed pt time to share, express her emotions and is encouraged to seek a healthy balance while she is comforting her best friend.  Pt expressed feelings with CSW and CSW reflected with pt on how this may open up her wounds and may cause her to "fall backwards" in her grief process.  Pt previously shared with CSW the recent loss of her husband of 28 years in October. Pt with grief, sadness, loneliness and situational Depression screen reviewed Solution-Focused Strategies employed:  CSW discussed the overall impact grief and loss/depression can cause to the physical, emotional and health wellness and the importance of allowing ourselves to grieve; cry, etc.  Mindfulness or Relaxation training provided Active listening / Reflection utilized  Emotional Support Provided Provided general psycho-education for mental health needs  Reviewed mental health medications and discussed importance of compliance: Pt reports taking Cymbalta for her Fibromyalgia and Xanax PRN Participation in counseling encouraged : Pt plans to seek providers in-network with her [place] insurance- as well as consider local hospice offerings Participation in support  group encouraged : Pt aware of the local hospice agency programs and use to volunteer there- she indicates new group classes will start in January- encouraged pt to look into programs they may be offering over the holiday season that may be helpful  Suicidal Ideation/Homicidal Ideation assessed:denies SI/HI Encouraged pt to continue to reach out and engage with friends, family and church friends who are supportive and she states; "my people have kept me busy and helped me get through this so far" Reminded pt of the 988 # for non-emergent mental health support Pt voices appreciation of CSW outreach and supportive counseling offered          Our next appointment is by telephone on 03/16/22   Please call the care guide team at [phone] if you need to cancel or reschedule your appointment.   If you are experiencing a Mental Health or [place] or need someone to talk to, please call the Suicide and Crisis Lifeline: 988 call the [place] National Suicide Prevention Lifeline: (616)047-9796 or TTY: (207)317-6503 TTY (704) 044-8521) to talk to a trained counselor call 911   Patient verbalizes understanding of instructions and care plan provided today and agrees to view in [place]. Active MyChart status and patient understanding of how to access instructions and care plan via MyChart confirmed with patient.     Telephone follow up appointment with care management team member scheduled for: 03/16/22  Eduard Clos MSW, LCSW Licensed Clinical Social Worker    [phone]

## 2022-02-28 NOTE — Patient Outreach (Signed)
  Care Coordination   Follow Up Visit Note   02/28/2022 Name: Doris Reyes MRN: [id number] DOB: [birth year]  Doris Reyes is a 71 y.o. year old female who sees [name], MD for primary care. I spoke with  [name] by phone today.  What matters to the patients health and wellness today?  Grief/loss support    Goals Addressed             This Visit's Progress    Grief support for recent loss of husband       Care Coordination Interventions: Pt reports she was able to attend a counseling session at hospice on last Friday and it went well Pt has been going through her husband's belongings to find special things to gift to family at [place] acknowledges this made for a difficult week. Pt was invited to 4 Thanksgiving gatherings and "went to all of them"  Pt recently shared also dealing with the tragic death of her best friends' son who was killed in a homicide/suicide event.  CSW allowed pt time to share, express her emotions and is encouraged to seek a healthy balance while she is comforting her best friend.  Pt expressed feelings with CSW and CSW reflected with pt on how this may open up her wounds and may cause her to "fall backwards" in her grief process.  Pt previously shared with CSW the recent loss of her husband of 28 years in October. Pt with grief, sadness, loneliness and situational Depression screen reviewed Solution-Focused Strategies employed:  CSW discussed the overall impact grief and loss/depression can cause to the physical, emotional and health wellness and the importance of allowing ourselves to grieve; cry, etc.  Mindfulness or Relaxation training provided Active listening / Reflection utilized  Emotional Support Provided Provided general psycho-education for mental health needs  Reviewed mental health medications and discussed importance of compliance: Pt reports taking Cymbalta for her Fibromyalgia and Xanax PRN Participation in counseling  encouraged : Pt plans to seek providers in-network with her [place] insurance- as well as consider local hospice offerings Participation in support group encouraged : Pt aware of the local hospice agency programs and use to volunteer there- she indicates new group classes will start in January- encouraged pt to look into programs they may be offering over the holiday season that may be helpful  Suicidal Ideation/Homicidal Ideation assessed:denies SI/HI Encouraged pt to continue to reach out and engage with friends, family and church friends who are supportive and she states; "my people have kept me busy and helped me get through this so far" Reminded pt of the 92 # for non-emergent mental health support Pt voices appreciation of CSW outreach and supportive counseling offered          SDOH assessments and interventions completed:  Yes     Care Coordination Interventions:  Yes, provided   Follow up plan: Follow up call scheduled for 03/16/22    Encounter Outcome:  Pt. Visit Completed

## 2022-03-16 ENCOUNTER — Ambulatory Visit: Payer: Self-pay | Admitting: *Deleted

## 2022-03-16 NOTE — Patient Outreach (Signed)
  Care Coordination   Follow Up Visit Note   03/16/2022 Name: Doris Reyes MRN: [id number] DOB: [birth year]  Doris Reyes is a 71 y.o. year old female who sees [name], MD for primary care. I spoke with  [name] by phone today.  What matters to the patients health and wellness today?  Grief support for recent loss of her husband    Goals Addressed             This Visit's Progress    Grief support for recent loss of husband       Care Coordination Interventions: Pt reports the holidays are feeling "different" and she is not very excited for Christmas-"I did not put much decorations out". CSW validated her actions and reassured her these feelings are to be expected after losing your spouse. This being the first Christmas without him especially challenging- suggested pt think of something she can begin as a "tradition" in his memory.  Pt plans to continue attending group at church called "Grief Share" which meets weekly. She found out the hospice support group will not be in [place] but instead in [facility]- she does not want to drive to [employer]. CSW talked with pt about normalizing her grief and emotions- this is to be expected after losing Bucky in October-  Pt may go visit her 72yo aunt on Christmas morning if she feels up to it- she is anticipating Christmas morning to be difficult; stating, "that was our time".... Pt was able to attend a counseling session at hospice and will schedule a follow up when/if needs Pt was invited to 4 Thanksgiving gatherings and "went to all of them"  Pt recently shared also dealing with the tragic death of her best friends' son who was killed in a homicide/suicide event.  CSW allowed pt time to share, express her emotions and is encouraged to seek a healthy balance while she is comforting her best friend.  Pt expressed feelings with CSW and CSW reflected with pt on how this may open up her wounds and may cause her to "fall  backwards" in her grief process.  Pt previously shared with CSW the recent loss of her husband of 28 years in October. Pt with grief, sadness, loneliness and situational Depression screen reviewed Solution-Focused Strategies employed:  CSW discussed the overall impact grief and loss/depression can cause to the physical, emotional and health wellness and the importance of allowing ourselves to grieve; cry, etc.  Mindfulness or Relaxation training provided Active listening / Reflection utilized  Emotional Support Provided Provided general psycho-education for mental health needs  Reviewed mental health medications and discussed importance of compliance: Pt reports taking Cymbalta for her Fibromyalgia and Xanax PRN Participation in counseling encouraged : Pt plans to seek providers in-network with her [place] insurance- as well as consider local hospice offerings Suicidal Ideation/Homicidal Ideation assessed:denies SI/HI Encouraged pt to continue to reach out and engage with friends, family and church friends who are supportive and she states; "my people have kept me busy and helped me get through this so far" Reminded pt of the 45 # for non-emergent mental health support Pt voices appreciation of CSW outreach and supportive counseling offered          SDOH assessments and interventions completed:  Yes     Care Coordination Interventions:  Yes, provided   Follow up plan: Follow up call scheduled for 04/12/22    Encounter Outcome:  Pt. Visit Completed

## 2022-03-16 NOTE — Patient Instructions (Signed)
Visit Information  Thank you for taking time to visit with me today. Please don't hesitate to contact me if I can be of assistance to you.   Following are the goals we discussed today:   Goals Addressed             This Visit's Progress    Grief support for recent loss of husband       Care Coordination Interventions: Pt reports the holidays are feeling "different" and she is not very excited for Christmas-"I did not put much decorations out". CSW validated her actions and reassured her these feelings are to be expected after losing your spouse. This being the first Christmas without him especially challenging- suggested pt think of something she can begin as a "tradition" in his memory.  Pt plans to continue attending group at church called "Grief Share" which meets weekly. She found out the hospice support group will not be in [place] but instead in [facility]- she does not want to drive to [employer]. CSW talked with pt about normalizing her grief and emotions- this is to be expected after losing Bucky in October-  Pt may go visit her 47yo aunt on Christmas morning if she feels up to it- she is anticipating Christmas morning to be difficult; stating, "that was our time".... Pt was able to attend a counseling session at hospice and will schedule a follow up when/if needs Pt was invited to 4 Thanksgiving gatherings and "went to all of them"  Pt recently shared also dealing with the tragic death of her best friends' son who was killed in a homicide/suicide event.  CSW allowed pt time to share, express her emotions and is encouraged to seek a healthy balance while she is comforting her best friend.  Pt expressed feelings with CSW and CSW reflected with pt on how this may open up her wounds and may cause her to "fall backwards" in her grief process.  Pt previously shared with CSW the recent loss of her husband of 28 years in October. Pt with grief, sadness, loneliness and situational  Depression screen reviewed Solution-Focused Strategies employed:  CSW discussed the overall impact grief and loss/depression can cause to the physical, emotional and health wellness and the importance of allowing ourselves to grieve; cry, etc.  Mindfulness or Relaxation training provided Active listening / Reflection utilized  Emotional Support Provided Provided general psycho-education for mental health needs  Reviewed mental health medications and discussed importance of compliance: Pt reports taking Cymbalta for her Fibromyalgia and Xanax PRN Participation in counseling encouraged : Pt plans to seek providers in-network with her [place] insurance- as well as consider local hospice offerings Suicidal Ideation/Homicidal Ideation assessed:denies SI/HI Encouraged pt to continue to reach out and engage with friends, family and church friends who are supportive and she states; "my people have kept me busy and helped me get through this so far" Reminded pt of the 988 # for non-emergent mental health support Pt voices appreciation of CSW outreach and supportive counseling offered          Our next appointment is by telephone on 04/12/22  Please call the care guide team at [phone] if you need to cancel or reschedule your appointment.   If you are experiencing a Mental Health or [place] or need someone to talk to, please call the Suicide and Crisis Lifeline: 988 call the [place] National Suicide Prevention Lifeline: 858 075 4043 or TTY: (828) 100-5927 TTY 240-149-1093) to talk to a trained counselor call  911   Patient verbalizes understanding of instructions and care plan provided today and agrees to view in [place]. Active MyChart status and patient understanding of how to access instructions and care plan via MyChart confirmed with patient.     Telephone follow up appointment with care management team member scheduled for: 04/12/22 Eduard Clos MSW, LCSW Licensed  Clinical Social Worker    [phone]

## 2022-03-25 DIAGNOSIS — M19072 Primary osteoarthritis, left ankle and foot: Secondary | ICD-10-CM | POA: Diagnosis not present

## 2022-04-12 ENCOUNTER — Encounter: Payer: Self-pay | Admitting: *Deleted

## 2022-04-12 ENCOUNTER — Encounter: Payer: Self-pay | Admitting: Oncology

## 2022-04-12 DIAGNOSIS — R87629 Unspecified abnormal cytological findings in specimens from vagina: Secondary | ICD-10-CM | POA: Diagnosis not present

## 2022-04-12 DIAGNOSIS — R87621 Atypical squamous cells cannot exclude high grade squamous intraepithelial lesion on cytologic smear of vagina (ASC-H): Secondary | ICD-10-CM | POA: Diagnosis not present

## 2022-04-12 NOTE — Patient Outreach (Signed)
  Care Coordination   Follow Up Visit Note   04/12/2022 Name: Doris Reyes MRN: [id number] DOB: [birth year]  Doris Reyes is a 72 y.o. year old female who sees [name], MD for primary care. I spoke with  [name] by phone today.  What matters to the patients health and wellness today?  Health and mental health wellness.    Goals Addressed             This Visit's Progress    Grief support for recent loss of husband       Care Coordination Interventions: Pt reports had a good Christmas  Pt reports the holidays went better than expected with family and friends "keeping her busy".  Pt plans to begin the "Grief Share" program at her church next week- they will meet weekly for support. Pt shared she was able to "write a letter to [employer] pt admits she wrote a lot more than she thought she would- she now plans to start journaling too- CSW commended her on these positive steps for coping and processing her loss.  Pt feels she is "crying less" and finding her "new normal". She is relieved to have gotten through the holidays and is pleased with the headstone made-   CSW talked with pt about the positive st eps she is taking to cope with her loss- rPt may go visit her 67yo aunt on Christmas morning if she feels up to it- she is anticipating Christmas morning to be difficult; stating, "that was our time".... Pt previously shared with CSW the recent loss of her husband of 28 years in October. Pt with grief, sadness, loneliness and situational Depression screen reviewed Solution-Focused Strategies employed:  CSW discussed the overall impact grief and loss/depression can cause to the physical, emotional and health wellness and the importance of allowing ourselves to grieve; cry, etc.  Mindfulness or Relaxation training provided Active listening / Reflection utilized  Emotional Support Provided Provided general psycho-education for mental health needs  Reviewed mental health  medications and discussed importance of compliance: Pt reports taking Cymbalta for her Fibromyalgia and Xanax PRN Participation in counseling encouraged : Pt plans to seek providers in-network with her [place] insurance- as well as consider local hospice offerings Suicidal Ideation/Homicidal Ideation assessed:denies SI/HI Encouraged pt to continue to reach out and engage with friends, family and church friends who are supportive and she states; "my people have kept me busy and helped me get through this so far" Reminded pt of the 56 # for non-emergent mental health support Pt voices appreciation of CSW outreach and supportive counseling offered          SDOH assessments and interventions completed:  Yes     Care Coordination Interventions:  Yes, provided   Follow up plan: Follow up call scheduled for 05/02/22    Encounter Outcome:  Pt. Visit Completed

## 2022-04-12 NOTE — Patient Instructions (Signed)
Visit Information  Thank you for taking time to visit with me today. Please don't hesitate to contact me if I can be of assistance to you.   Following are the goals we discussed today:   Goals Addressed             This Visit's Progress    Grief support for recent loss of husband       Care Coordination Interventions: Pt reports had a good Christmas  Pt reports the holidays went better than expected with family and friends "keeping her busy".  Pt plans to begin the "Grief Share" program at her church next week- they will meet weekly for support. Pt shared she was able to "write a letter to [employer] pt admits she wrote a lot more than she thought she would- she now plans to start journaling too- CSW commended her on these positive steps for coping and processing her loss.  Pt feels she is "crying less" and finding her "new normal". She is relieved to have gotten through the holidays and is pleased with the headstone made-   CSW talked with pt about the positive st eps she is taking to cope with her loss- rPt may go visit her 31yo aunt on Christmas morning if she feels up to it- she is anticipating Christmas morning to be difficult; stating, "that was our time".... Pt previously shared with CSW the recent loss of her husband of 28 years in October. Pt with grief, sadness, loneliness and situational Depression screen reviewed Solution-Focused Strategies employed:  CSW discussed the overall impact grief and loss/depression can cause to the physical, emotional and health wellness and the importance of allowing ourselves to grieve; cry, etc.  Mindfulness or Relaxation training provided Active listening / Reflection utilized  Emotional Support Provided Provided general psycho-education for mental health needs  Reviewed mental health medications and discussed importance of compliance: Pt reports taking Cymbalta for her Fibromyalgia and Xanax PRN Participation in counseling encouraged : Pt plans  to seek providers in-network with her [employer] insurance- as well as consider local hospice offerings Suicidal Ideation/Homicidal Ideation assessed:denies SI/HI Encouraged pt to continue to reach out and engage with friends, family and church friends who are supportive and she states; "my people have kept me busy and helped me get through this so far" Reminded pt of the 988 # for non-emergent mental health support Pt voices appreciation of CSW outreach and supportive counseling offered          Our next appointment is by telephone on 05/02/22   Please call the care guide team at [phone] if you need to cancel or reschedule your appointment.   If you are experiencing a Mental Health or [place] or need someone to talk to, please call the Suicide and Crisis Lifeline: 988 call the [place] National Suicide Prevention Lifeline: 914-456-5686 or TTY: 641-219-9974 TTY 320-470-9679) to talk to a trained counselor call 911   Patient verbalizes understanding of instructions and care plan provided today and agrees to view in [place]. Active MyChart status and patient understanding of how to access instructions and care plan via MyChart confirmed with patient.     Telephone follow up appointment with care management team member scheduled for:05/02/22  Eduard Clos, MSW, [place] Worker [facility] [phone]

## 2022-04-25 ENCOUNTER — Inpatient Hospital Stay: Payer: Medicare Other | Attending: Oncology | Admitting: Oncology

## 2022-04-25 ENCOUNTER — Other Ambulatory Visit: Payer: Self-pay | Admitting: Oncology

## 2022-04-25 ENCOUNTER — Encounter: Payer: Self-pay | Admitting: Oncology

## 2022-04-25 VITALS — BP 133/75 | HR 60 | Temp 97.9°F | Resp 16 | Ht 63.0 in | Wt 212.2 lb

## 2022-04-25 DIAGNOSIS — M810 Age-related osteoporosis without current pathological fracture: Secondary | ICD-10-CM

## 2022-04-25 DIAGNOSIS — G8929 Other chronic pain: Secondary | ICD-10-CM

## 2022-04-25 DIAGNOSIS — C50111 Malignant neoplasm of central portion of right female breast: Secondary | ICD-10-CM | POA: Diagnosis not present

## 2022-04-25 DIAGNOSIS — N6311 Unspecified lump in the right breast, upper outer quadrant: Secondary | ICD-10-CM

## 2022-04-25 NOTE — Progress Notes (Signed)
Clinic Day: 04/25/22   Referring physician:  [name], MD    Assessment & Plan    Invasive lobular carcinoma This was found on screening mammogram but the MRI reveals focal enhancement surrounding the biopsy clip up to 2.3 cm and there was non-mass enhancement posterior to this known malignancy on MRI.  This area was biopsied in order to guide her ultimate surgery.This is strongly ER/PR positive, HER2 negative and has a low Ki 67 of less than 5%.  She has now had a lumpectomy but had to go back for reexcision of the margins.  The sentinel lymph node was negative but she did have 2 primaries, measuring 2.4 cm and 2.2 cm, for a T2 N0 M0, stage IIA.  Even though she has several favorable characteristics, such as grade 1 histology and a low Ki 67, I still recommended that we pursue Endopredict testing to quantitate her risk for recurrence, and fortunately she has an EpClin score of 2.6, low risk.  This correlates with a 5.1% risk of distant recurrence in the next 10 years. Her benefit from chemotherapy would be 1.0% so we did not pursue. Her risk for a late recurrence is 4.0%. Unfortunately she has refused hormonal therapy.  Strong family history Including multiple females with breast cancer, her mother had colon cancer at age 95, and another maternal aunt had ovarian cancer.  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  She has been counseled on the implications of this and is already getting regular colonoscopy.  However the information will also be passed on to family members to pursue testing.  Osteoporosis Her spine shows a T score of -2.6 for osteoporosis and the right femur has osteopenia.  At the very least she should be taking calcium and vitamin D.  Right Shoulder Pain This has persisted for 6 months and worsened enough that she moved her appointment with me sooner to discuss this. I doubt this is related to her cancer. I will proceed  with shoulder x-rays, she will likely need an MRI of the shoulder and orthopedic referral. She has been to [facility] for previous problems.  She is taking a lot of tylenol for this.    Plan  She is scheduled for her annual diagnostic mammogram 05/02/2022. I will also add an ultrasound to evaluate the nodularity that I feel in the upper right breast, upper outer quadrant. I will see her back in 3 months unless her mammogram comes back abnormal. We will call her with the results of her x-ray shoulder. I discussed the assessment and treatment plan with the patient.  She was provided an opportunity to ask questions and all were answered.  The patient was advised to call back if she has further questions   I provided 15 minutes of face-to-face time during this this encounter and > 50% was spent counseling as documented under my assessment and plan.    [name], MD [facility] [address] [place] [address] Dept: [phone] Dept Fax: [phone]     CHIEF COMPLAINTS/PURPOSE OF CONSULTATION:  Breast cancer, stage IIA  HISTORY OF PRESENTING ILLNESS:  Doris Reyes 72 y.o. 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  She does have annual mammograms because of her strong family history, but was on hormone replacement therapy for 10  years.  She had a screening mammogram at [facility] on May 05, 2021 which revealed architectural distortion which was felt to be indeterminant.  She had a diagnostic mammogram on March 3 which showed mild nipple retraction and a 1 cm area of focal asymmetry in the retroareolar region.  Ultrasound revealed a hypoechoic irregular mass measuring 9 mm in that area.  On March 14 she had a biopsy performed which revealed a 0.9 cm irregular mass with spiculated margins in the right breast at 12:00, approximately 2  cm from the nipple and anteriorly in the breast.  This was found to be grade 2 invasive mammary carcinoma as well as mammary carcinoma in situ.  The E-cadherin immunohistochemistry stains were negative and so this is consistent with invasive lobular carcinoma.  The estrogen receptors were positive at 95% and progesterone receptors positive at 95% with HER2 by immunohistochemistry positive at 2+ but FISH was negative.  Ki-67 was less than 5%.  The carcinoma in situ was positive for E-cadherin, consistent with ductal carcinoma in situ.  She had an MRI of the breast and was found to have focal enhancement in the right breast surrounding the biopsy clip and some non-mass enhancement posterior to the malignancy.  It was recommended that she have an MRI guided biopsy of the clumped non-mass enhancement area and this revealed invasive lobular carcinoma grade 1 with lobular carcinoma in situ and extensive fibrocystic changes with focal intraluminal microcalcification and intraductal papillomatosis.  Due to her strong family history including multiple females with breast cancer in her mother with colon cancer at age 70, she did have genetic testing with [facility] and was found to have a carrier mutation of MUTYH.  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  It was recommended that she have colonoscopy beginning at age 40 or 45 years prior to the age of her first-degree use relatives age at colorectal cancer diagnosis.  It was recommended this be repeated every 5 years.  This was explained to her.  She has now had a lumpectomy but had to go back for reexcision of the margins.  The sentinel lymph node was negative but she did have 2 primaries, measuring 2.4 cm and 2.2 cm, for a T2 N0 M0, stage IIA. This was ER/PR positive, HER 2 negative and a Ki-67 was 5%.  Even though she has several favorable  characteristics, such as grade 1 histology and a low Ki 67, I still recommended that we pursue Endopredict testing to quantitate her risk for recurrence, and fortunately she has an EpClin score of 2.6, low risk. This correlates with a 5.1% risk of distant recurrence in the next 10 years, with only a 1.0% benefit of chemotherapy. Her risk of late recurrence is 4.0%. I recommended hormonal therapy but she refused.  I reviewed her records extensively and collaborated the history with the patient.  SUMMARY OF ONCOLOGIC HISTORY: Oncology History  Cancer of central portion of right breast ([place])  06/08/2021 Initial Diagnosis   Breast cancer in female [facility])   07/23/2021 Genetic Testing   Negative hereditary cancer genetic testing: no pathogenic variants detected in [facility] BRCAPlus Panel. Report date is July 23, 2021.  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  The report date is Jul 26, 2021.  The  BRCAplus panel offered by [employer] and includes sequencing and deletion/duplication analysis for the following 8 genes: ATM, BRCA1, BRCA2, CDH1, CHEK2, PALB2, PTEN, and TP53.  Results of pan-cancer panel pending.   The CancerNext-Expanded gene panel offered by [facility] and includes sequencing and rearrangement analysis for the following 77 genes: AIP, ALK, APC*, ATM*, AXIN2, BAP1, BARD1, BLM, BMPR1A, BRCA1*, BRCA2*, BRIP1*, CDC73, CDH1*, CDK4, CDKN1B, CDKN2A, CHEK2*, CTNNA1, DICER1, FANCC, FH, FLCN, GALNT12, KIF1B, LZTR1, MAX, MEN1, MET, MLH1*, MSH2*, MSH3, MSH6*, MUTYH*, NBN, NF1*, NF2, NTHL1, PALB2*, PHOX2B, PMS2*, POT1, PRKAR1A, PTCH1, PTEN*, RAD51C*, RAD51D*, RB1, RECQL, RET, SDHA, SDHAF2, SDHB, SDHC, SDHD, SMAD4, SMARCA4, SMARCB1, SMARCE1, STK11, SUFU, TMEM127, TP53*, TSC1, TSC2, VHL and XRCC2 (sequencing and deletion/duplication); EGFR, EGLN1, HOXB13, KIT, MITF,  PDGFRA, POLD1, and POLE (sequencing only); EPCAM and GREM1 (deletion/duplication only). DNA and RNA analyses performed for * genes.    08/23/2021 Cancer Staging   Staging form: Breast, AJCC 8th Edition - Pathologic stage from 08/23/2021: Stage IA (pT2(2), pN0(sn), cM0, G1, ER+, PR+, HER2-) - Signed by [name], MD on 09/29/2021 Histopathologic type: Lobular carcinoma, NOS Stage prefix: Initial diagnosis Method of lymph node assessment: Sentinel lymph node biopsy Nuclear grade: G1 Multigene prognostic tests performed: EndoPredict Histologic grading system: 3 grade system Residual tumor (R): R0 - None Laterality: Right Tumor size (mm): 24 Multiple tumors: Yes Number of tumors: 2 Lymph-vascular invasion (LVI): LVI not present (absent)/not identified Diagnostic confirmation: Positive histology PLUS positive immunophenotyping and/or positive genetic studies Specimen type: Excision Staged by: Managing physician Menopausal status: Postmenopausal Ki-67 (%): 5 Stage used in treatment planning: Yes National guidelines used in treatment planning: Yes Type of national guideline used in treatment planning: NCCN   In terms of breast cancer risk profile:  She menarched at early age of [age] and went to surgical menopause at age 68 She had 1 pregnancy, her first child was born at age 48 She was exposed hormone replacement therapy for 10 years in the form of a patch.  She has significant positive family history of Breast and ovarian and colon cancer  INTERVAL HISTORY: Doris Reyes is here today for a follow up for breast cancer stage IIA. She was recently widowed. Patient states that she feels ok and comes in complaints of right shoulder pain down into her axilla for the past 6 months. I suggested we schedule an X-ray and if this is normal, an MRI and orthopedic surgeon. She is scheduled for her annual diagnostic mammogram 05/02/2022. I will also add an ultrasound. I will see her back in 3 months  unless her mammogram comes back abnormal. She denies signs of infection such as sore throat, sinus drainage, cough, or urinary symptoms.  She denies fevers or recurrent chills. She denies pain. She denies nausea, vomiting, chest pain, dyspnea or cough. Her weight has increased 4 pounds over last 2 months .   MEDICAL HISTORY:  Past Medical History:  Diagnosis Date   Anxiety    Cancer ([place])    right breast ILC   Complication of anesthesia    difficulty waking up   Depression    Diverticulitis    Family history of breast cancer    Family history of ovarian cancer    Family history of prostate cancer    Fibromyalgia    GERD (gastroesophageal reflux disease)    History of colonic polyps    Hyperlipidemia    Hypertension    IBS (irritable bowel syndrome)    Ischemic colitis ([place]) 03/2012  Osteoporosis    RLS (restless legs syndrome)    Sinusitis    Sleep apnea    CPAP nightly  Vitamin D deficiency Iron deficiency treated with IV iron infusions in 2019  SURGICAL HISTORY: Past Surgical History:  Procedure Laterality Date   BREAST LUMPECTOMY WITH RADIOACTIVE SEED LOCALIZATION Right 08/04/2021   Procedure: RIGHT BREAST LUMPECTOMY WITH RADIOACTIVE SEED LOCALIZATION;  Surgeon: Erroll Luna, MD;  Location: [facility];  Service: General;  Laterality: Right;   CHOLECYSTECTOMY  2004   COLONOSCOPY WITH PROPOFOL N/A 04/18/2017   Procedure: COLONOSCOPY WITH PROPOFOL;  Surgeon: [name], MD;  Location: [facility];  Service: Endoscopy;  Laterality: N/A;   ESOPHAGOGASTRODUODENOSCOPY (EGD) WITH PROPOFOL N/A 04/18/2017   Procedure: ESOPHAGOGASTRODUODENOSCOPY (EGD) WITH PROPOFOL;  Surgeon: [name], MD;  Location: [facility];  Service: Endoscopy;  Laterality: N/A;   FLEXIBLE SIGMOIDOSCOPY  04/20/2012   Procedure: FLEXIBLE SIGMOIDOSCOPY;  Surgeon: Beryle Beams, MD;  Location: [facility];  Service: Endoscopy;  Laterality: N/A;   NISSEN FUNDOPLICATION  [id number]   PARAESOPHAGEAL HERNIA REPAIR   09/11/2009   and Nissen fundoplication   RE-EXCISION OF BREAST LUMPECTOMY Right 08/18/2021   Procedure: RE-EXCISION RIGHT BREAST LUMPECTOMY;  Surgeon: Erroll Luna, MD;  Location: [facility];  Service: General;  Laterality: Right;   SENTINEL NODE BIOPSY N/A 08/04/2021   Procedure: SENTINEL NODE BIOPSY;  Surgeon: Erroll Luna, MD;  Location: [facility];  Service: General;  Laterality: N/A;   TONSILLECTOMY  1960's   TOTAL ABDOMINAL HYSTERECTOMY  2001  She had 2 large benign tumors of the ovaries removed with bilateral salpingo oophorectomy  SOCIAL HISTORY: Social History   Socioeconomic History   Marital status: Widowed    Spouse name: Jeneen Rinks   Number of children: 1   Years of education: Not on file   Highest education level: Some college, no degree  Occupational History   Occupation: disabled  Tobacco Use   Smoking status: Never   Smokeless tobacco: Never   Tobacco comments:    brief exposure through her husband  Vaping Use   Vaping Use: Never used  Substance and Sexual Activity   Alcohol use: Never    Alcohol/week: 0.0 standard drinks of alcohol   Drug use: Never   Sexual activity: Not Currently  Other Topics Concern   Not on file  Social History Narrative   From [place] originally. Previously lived in [place]. Previously worked at [employer] also in a [employer]. Has also worked as a Network engineer for [employer]. No pets currently. No bird exposure. No known mold in her current home.    Lives with husband   Caffeine- coffee 2 c daily   Social Determinants of Health   Financial Resource Strain: Low Risk  (02/01/2022)   Overall Financial Resource Strain (CARDIA)    Difficulty of Paying Living Expenses: Not hard at all  Food Insecurity: No Food Insecurity (02/01/2022)   Hunger Vital Sign    Worried About Running Out of Food in the Last Year: Never true    Ran Out of Food in the Last Year: Never true  Transportation Needs: No Transportation Needs  (02/01/2022)   PRAPARE - Hydrologist (Medical): No    Lack of Transportation (Non-Medical): No  Physical Activity: Not on file  Stress: Not on file  Social Connections: Not on file  Intimate Partner Violence: Not At Risk (02/01/2022)   Humiliation, Afraid, Rape, and Kick questionnaire    Fear of Current or  Ex-Partner: No    Emotionally Abused: No    Physically Abused: No    Sexually Abused: No   The patient is here with her husband [name] and daughter Museum/gallery conservator today.  She has never smoked and does not drink alcohol or use drugs. FAMILY HISTORY: Family History  Problem Relation Age of Onset   Colon cancer Mother 15   Alzheimer's disease Mother    Heart attack Father    Cancer Maternal Aunt        x2   Alzheimer's disease Maternal Aunt    Ovarian cancer Maternal Aunt    Prostate cancer Maternal Uncle    Kidney disease Maternal Uncle    Atrial fibrillation Maternal Uncle    Stroke Paternal Uncle    Aneurysm Paternal Grandmother        brain   Stroke Paternal Grandfather    Prostate cancer Cousin        paternal first cousin   Prostate cancer Cousin        paternal first cousin   Esophageal cancer Cousin        maternal first cousin   Breast cancer Cousin        DCIS, maternal first cousin   Ovarian cancer Cousin        maternal first cousin   Rheum arthritis Other    Lung disease Neg Hx   Ovarian cancer                                               maternal aunt                         80's Breast cancer                                                 cousin                                     47's Prostate cancer                                              maternal uncle                        71's  ALLERGIES:  is allergic to ceftin [cefuroxime axetil], cefuroxime, pneumococcal vac polyvalent, pneumococcal vaccine, tramadol, and tramadol hcl.  MEDICATIONS:  Current Outpatient Medications  Medication Sig Dispense Refill   acetaminophen  (TYLENOL) 650 MG CR tablet 1,300 mg in the morning and at bedtime.     ALPRAZolam (XANAX) 0.25 MG tablet Take 0.25 mg by mouth at bedtime as needed for anxiety.     amLODipine (NORVASC) 5 MG tablet Take 5 mg by mouth daily.     Ascorbic Acid (VITAMIN C) 500 MG CHEW Chew 500 mg by mouth daily.     aspirin EC 81 MG tablet Take 81 mg by mouth daily. Swallow whole.     atenolol (TENORMIN) 50 MG tablet Take  75 mg by mouth daily.      benazepril (LOTENSIN) 10 MG tablet Take 10 mg by mouth daily.     benzonatate (TESSALON) 100 MG capsule Take 100-200 mg by mouth at bedtime.     Biotin 1000 MCG tablet Take 1,000 mcg by mouth daily.     Calcium Citrate-Vitamin D (CITRACAL + D PO) Take 1 tablet by mouth daily.     cetirizine (ZYRTEC) 10 MG tablet Take 10 mg by mouth daily as needed for allergies.      Cholecalciferol (VITAMIN D) 1000 UNITS capsule Take 2,000 Units by mouth daily.      cyclobenzaprine (FLEXERIL) 5 MG tablet Take 5 mg by mouth 3 (three) times daily as needed for muscle spasms.     diclofenac sodium (VOLTAREN) 1 % GEL Apply 1 application topically daily as needed (for pain). Apply as directed     dicyclomine (BENTYL) 10 MG capsule Take 10 mg by mouth 3 (three) times daily as needed for spasms.     diphenhydrAMINE (BENADRYL) 25 MG tablet Take 25 mg by mouth every 6 (six) hours as needed for allergies.     diphenoxylate-atropine (LOMOTIL) 2.5-0.025 MG tablet Take 2 tablets by mouth 4 (four) times daily as needed for diarrhea or loose stools.     doxycycline (VIBRA-TABS) 100 MG tablet Take 100 mg by mouth 2 (two) times daily.     DULoxetine (CYMBALTA) 60 MG capsule Take 120 mg by mouth daily.      EPINEPHrine (EPIPEN 2-PAK) 0.3 mg/0.3 mL IJ SOAJ injection See admin instructions.     famotidine (PEPCID) 20 MG tablet Take 20 mg by mouth at bedtime.     fexofenadine (ALLEGRA) 180 MG tablet Take 180 mg by mouth daily.     fluticasone (FLONASE) 50 MCG/ACT nasal spray Place 1 spray into both  nostrils daily.     Nutritional Supplements (JUICE PLUS FIBRE PO) Take 2 each by mouth daily. Fruits and Vegetables     Omega-3 Fatty Acids (FISH OIL) 1000 MG CAPS Take 1,000 mg by mouth daily.      ondansetron (ZOFRAN) 4 MG tablet Take 4 mg by mouth every 8 (eight) hours as needed for nausea or vomiting.     oxyCODONE (OXY IR/ROXICODONE) 5 MG immediate release tablet Take 1 tablet (5 mg total) by mouth every 6 (six) hours as needed for severe pain. 15 tablet 0   pantoprazole (PROTONIX) 40 MG tablet Take 40 mg by mouth daily.      Phenylephrine-APAP-Guaifenesin (TYLENOL SINUS SEVERE PO) Take 2 tablets by mouth daily as needed (for congestion).     polyvinyl alcohol (LIQUIFILM TEARS) 1.4 % ophthalmic solution Place 1 drop into both eyes as needed for dry eyes.     Probiotic Product (PROBIOTIC & ACIDOPHILUS EX ST PO) Take 1 capsule by mouth daily. Ultra Flora Plus Capsules     promethazine-dextromethorphan (PROMETHAZINE-DM) 6.25-15 MG/5ML syrup Take 5 mLs by mouth every 6 (six) hours as needed.     Propylene Glycol (SYSTANE BALANCE) 0.6 % SOLN Place 1 drop into both eyes daily.     rOPINIRole (REQUIP) 0.5 MG tablet Take 0.5 mg by mouth at bedtime.     simvastatin (ZOCOR) 10 MG tablet Take 10 mg by mouth at bedtime.       sucralfate (CARAFATE) 1 GM/10ML suspension Take 1 g by mouth 4 (four) times daily -  with meals and at bedtime.     traZODone (DESYREL) 50 MG tablet Take 50 mg by  mouth at bedtime.     vitamin E 400 UNIT capsule Take 400 Units by mouth daily.       No current facility-administered medications for this visit.    REVIEW OF SYSTEMS:   Review of Systems  Constitutional: Negative.  Negative for appetite change, chills, diaphoresis, fatigue, fever and unexpected weight change.  HENT:  Negative.  Negative for hearing loss, lump/mass, mouth sores, nosebleeds, sore throat, tinnitus, trouble swallowing and voice change.   Eyes: Negative.  Negative for eye problems and icterus.   Respiratory: Negative.  Negative for chest tightness, cough, hemoptysis, shortness of breath and wheezing.   Cardiovascular: Negative.  Negative for chest pain, leg swelling and palpitations.  Gastrointestinal: Negative.  Negative for abdominal distention, abdominal pain, blood in stool, constipation, diarrhea, nausea, rectal pain and vomiting.  Endocrine: Negative.   Genitourinary: Negative.  Negative for bladder incontinence, difficulty urinating, dyspareunia, dysuria, frequency, hematuria, menstrual problem, nocturia, pelvic pain, vaginal bleeding and vaginal discharge.   Musculoskeletal:  Positive for arthralgias (right shoulder). Negative for back pain, flank pain, gait problem, myalgias, neck pain and neck stiffness.  Skin: Negative.  Negative for itching, rash and wound.  Neurological:  Negative for dizziness, extremity weakness, gait problem, headaches, light-headedness, numbness, seizures and speech difficulty.  Hematological: Negative.  Negative for adenopathy. Does not bruise/bleed easily.  Psychiatric/Behavioral: Negative.  Negative for confusion, decreased concentration, depression, sleep disturbance and suicidal ideas. The patient is not nervous/anxious.    PHYSICAL EXAMINATION: ECOG PERFORMANCE STATUS: 0 - Asymptomatic Physical Exam Vitals and nursing note reviewed.  Constitutional:      General: She is not in acute distress.    Appearance: Normal appearance. She is normal weight. She is not ill-appearing, toxic-appearing or diaphoretic.  HENT:     Head: Normocephalic and atraumatic.     Right Ear: Tympanic membrane, ear canal and external ear normal. There is no impacted cerumen.     Left Ear: Tympanic membrane, ear canal and external ear normal. There is no impacted cerumen.     Nose: Nose normal. No congestion or rhinorrhea.     Mouth/Throat:     Mouth: Mucous membranes are moist.     Pharynx: Oropharynx is clear. No oropharyngeal exudate or posterior oropharyngeal  erythema.  Eyes:     General: No scleral icterus.       Right eye: No discharge.        Left eye: No discharge.     Extraocular Movements: Extraocular movements intact.     Conjunctiva/sclera: Conjunctivae normal.     Pupils: Pupils are equal, round, and reactive to light.  Neck:     Vascular: No carotid bruit.  Cardiovascular:     Rate and Rhythm: Normal rate and regular rhythm.     Pulses: Normal pulses.     Heart sounds: Normal heart sounds. No murmur heard.    No friction rub. No gallop.  Pulmonary:     Effort: Pulmonary effort is normal. No respiratory distress.     Breath sounds: Normal breath sounds. No stridor. No wheezing, rhonchi or rales.  Chest:     Chest wall: No tenderness.     Comments: No palpable mass in either breast.   She has a healing incision of the central right breast. Mild scarring of the right areolar complex and some nodularity in the superior right breast upper outer quadrant.    Abdominal:     General: Bowel sounds are normal. There is no distension.     Palpations:  Abdomen is soft. There is no hepatomegaly, splenomegaly or mass.     Tenderness: There is no abdominal tenderness. There is no right CVA tenderness, left CVA tenderness, guarding or rebound.     Hernia: No hernia is present.  Musculoskeletal:        General: No swelling, deformity or signs of injury.     Right shoulder: Tenderness (glenoid) present. Decreased range of motion.     Cervical back: Normal range of motion and neck supple. No rigidity or tenderness.     Right lower leg: No edema.     Left lower leg: No edema.  Lymphadenopathy:     Cervical: No cervical adenopathy.     Right cervical: No superficial, deep or posterior cervical adenopathy.    Left cervical: No superficial, deep or posterior cervical adenopathy.     Upper Body:     Right upper body: No supraclavicular, axillary or pectoral adenopathy.     Left upper body: No supraclavicular, axillary or pectoral adenopathy.   Skin:    General: Skin is warm and dry.     Coloration: Skin is not jaundiced or pale.     Findings: No bruising, erythema, lesion or rash.  Neurological:     General: No focal deficit present.     Mental Status: She is alert and oriented to person, place, and time. Mental status is at baseline.     Cranial Nerves: No cranial nerve deficit.     Sensory: No sensory deficit.     Motor: No weakness.     Coordination: Coordination normal.     Gait: Gait normal.     Deep Tendon Reflexes: Reflexes normal.  Psychiatric:        Mood and Affect: Mood normal.        Behavior: Behavior normal.        Thought Content: Thought content normal.        Judgment: Judgment normal.    LABORATORY DATA:  I have reviewed the data as listed Lab Results  Component Value Date   WBC 6.6 07/28/2021   HGB 13.6 07/28/2021   HCT 42.1 07/28/2021   MCV 89.4 07/28/2021   PLT 266 07/28/2021   Lab Results  Component Value Date   NA 140 07/28/2021   K 4.3 07/28/2021   CL 105 07/28/2021   CO2 27 07/28/2021   Component Ref Range & Units 9 mo ago (07/28/21) 9 mo ago (06/30/21)  Sodium 135 - 145 mmol/L 140 140 R  Potassium 3.5 - 5.1 mmol/L 4.3 4.2 R  Chloride 98 - 111 mmol/L 105 105 R  CO2 22 - 32 mmol/L 27 25 Abnormal  R  Glucose, Bld 70 - 99 mg/dL 108 High  109 R  BUN 8 - 23 mg/dL 15 16 R  Creatinine, Ser 0.44 - 1.00 mg/dL 0.95 0.7 R  Calcium 8.9 - 10.3 mg/dL 9.7     RADIOGRAPHIC STUDIES: No results found.    I,Jasmine M Lassiter,acting as a scribe for [name], MD.,have documented all relevant documentation on the behalf of [name], MD,as directed by  [name], MD while in the presence of [name], MD.

## 2022-04-27 ENCOUNTER — Ambulatory Visit: Payer: Self-pay | Admitting: *Deleted

## 2022-04-27 NOTE — Addendum Note (Signed)
Addended by: Aleatha Borer on: 04/27/2022 05:02 PM   Modules accepted: Orders

## 2022-04-28 NOTE — Patient Outreach (Addendum)
Care Coordination   Follow Up Visit Note   04/28/2022 Name: Doris Reyes MRN: [id number] DOB: 12-24-1950  Doris Reyes is a 72 y.o. year old female who sees [name], MD for primary care. I spoke with  [name] by phone today.  What matters to the patients health and wellness today?  Upset as she has just found out she has a breast nodule that needs to be assessed/diagnosed.     Goals Addressed             This Visit's Progress    Grief support for recent loss of husband       Care Coordination Interventions: Pt reports she found out she has another breast nodule and will go next week for diagnostic mammogram and ultrasound. Pt is emotionally upset, worried and anxious to get results once done.  CSW offered support, allowed pt to voice her emotions and her reflection on "about 1 year ago now I was dealing with this".  Pt continues to connect with church friends and other friends for support and keeping busy. "Widowhood puts you in a new category"- feeling disconnected from [name]. CSW suggested she reach out to them and plan a get together which she thinks she may do in the future.  Pt continues to participate in the Grief Share group at church- "went to dinner with one of the girls after the group meeting which she enjoyed.  CSW also discussed ways to cope and remove the worry/anxiety through distractions, deep breathing/meditations,etc.    Pt reports the holidays went better than expected with family and friends "keeping her busy".  Pt plans to begin the "Grief Share" program at her church next week- they will meet weekly for support. Pt shared she was able to "write a letter to [employer] pt admits she wrote a lot more than she thought she would- she now plans to start journaling too- CSW commended her on these positive steps for coping and processing her loss.  Pt feels she is "crying less" and finding her "new normal". She is relieved to have gotten  through the holidays and is pleased with the headstone made-   CSW talked with pt about the positive st eps she is taking to cope with her loss- rPt may go visit her 81yo aunt on Christmas morning if she feels up to it- she is anticipating Christmas morning to be difficult; stating, "that was our time".... Pt previously shared with CSW the recent loss of her husband of 28 years in October. Pt with grief, sadness, loneliness and situational Depression screen reviewed Solution-Focused Strategies employed:  CSW discussed the overall impact grief and loss/depression can cause to the physical, emotional and health wellness and the importance of allowing ourselves to grieve; cry, etc.  Mindfulness or Relaxation training provided Active listening / Reflection utilized  Emotional Support Provided Provided general psycho-education for mental health needs  Reviewed mental health medications and discussed importance of compliance: Pt reports taking Cymbalta for her Fibromyalgia and Xanax PRN Participation in counseling encouraged : Pt plans to seek providers in-network with her [employer] insurance- as well as consider local hospice offerings Suicidal Ideation/Homicidal Ideation assessed:denies SI/HI Encouraged pt to continue to reach out and engage with friends, family and church friends who are supportive and she states; "my people have kept me busy and helped me get through this so far" Reminded pt of the 13 # for non-emergent mental health support Pt voices appreciation of CSW outreach and  supportive counseling offered          SDOH assessments and interventions completed:  Yes     Care Coordination Interventions:  Yes, provided   Follow up plan: Follow up call scheduled for 05/09/22    Encounter Outcome:  Pt. Visit Completed

## 2022-04-28 NOTE — Patient Instructions (Signed)
Visit Information  Thank you for taking time to visit with me today. Please don't hesitate to contact me if I can be of assistance to you.   Following are the goals we discussed today:   Goals Addressed             This Visit's Progress    Grief support for recent loss of husband       Care Coordination Interventions: Pt reports she found out she has another breast nodule and will go next week for diagnostic mammogram and ultrasound. Pt is emotionally upset, worried and anxious to get results once done.  CSW offered support, allowed pt to voice her emotions and her reflection on "about 1 year ago now I was dealing with this".  Pt continues to connect with church friends and other friends for support and keeping busy. "Widowhood puts you in a new category"- feeling disconnected from [name]. CSW suggested she reach out to them and plan a get together which she thinks she may do in the future.  Pt continues to participate in the Grief Share group at church- "went to dinner with one of the girls after the group meeting which she enjoyed.     Pt reports the holidays went better than expected with family and friends "keeping her busy".  Pt plans to begin the "Grief Share" program at her church next week- they will meet weekly for support. Pt shared she was able to "write a letter to [employer] pt admits she wrote a lot more than she thought she would- she now plans to start journaling too- CSW commended her on these positive steps for coping and processing her loss.  Pt feels she is "crying less" and finding her "new normal". She is relieved to have gotten through the holidays and is pleased with the headstone made-   CSW talked with pt about the positive st eps she is taking to cope with her loss- rPt may go visit her 60yo aunt on Christmas morning if she feels up to it- she is anticipating Christmas morning to be difficult; stating, "that was our time".... Pt previously shared with CSW  the recent loss of her husband of 28 years in October. Pt with grief, sadness, loneliness and situational Depression screen reviewed Solution-Focused Strategies employed:  CSW discussed the overall impact grief and loss/depression can cause to the physical, emotional and health wellness and the importance of allowing ourselves to grieve; cry, etc.  Mindfulness or Relaxation training provided Active listening / Reflection utilized  Emotional Support Provided Provided general psycho-education for mental health needs  Reviewed mental health medications and discussed importance of compliance: Pt reports taking Cymbalta for her Fibromyalgia and Xanax PRN Participation in counseling encouraged : Pt plans to seek providers in-network with her [employer] insurance- as well as consider local hospice offerings Suicidal Ideation/Homicidal Ideation assessed:denies SI/HI Encouraged pt to continue to reach out and engage with friends, family and church friends who are supportive and she states; "my people have kept me busy and helped me get through this so far" Reminded pt of the 988 # for non-emergent mental health support Pt voices appreciation of CSW outreach and supportive counseling offered          Our next appointment is by telephone on 05/09/22  Please call the care guide team at [phone] if you need to cancel or reschedule your appointment.   If you are experiencing a Mental Health or [place] or need someone to talk  to, please call the Suicide and Crisis Lifeline: 988 call the [place] National Suicide Prevention Lifeline: (662)347-6698 or TTY: 339-831-8711 TTY (432)576-2817) to talk to a trained counselor call 911   The patient verbalized understanding of instructions, educational materials, and care plan provided today and DECLINED offer to receive copy of patient instructions, educational materials, and care plan.   Telephone follow up appointment with care management team  member scheduled for: 05/09/22  Eduard Clos, MSW, [place] Worker [facility] [phone]

## 2022-04-29 DIAGNOSIS — M25511 Pain in right shoulder: Secondary | ICD-10-CM | POA: Diagnosis not present

## 2022-04-29 DIAGNOSIS — G8929 Other chronic pain: Secondary | ICD-10-CM | POA: Diagnosis not present

## 2022-04-29 DIAGNOSIS — M79601 Pain in right arm: Secondary | ICD-10-CM | POA: Diagnosis not present

## 2022-05-02 ENCOUNTER — Encounter: Payer: Medicare Other | Admitting: *Deleted

## 2022-05-02 DIAGNOSIS — N6489 Other specified disorders of breast: Secondary | ICD-10-CM | POA: Diagnosis not present

## 2022-05-02 DIAGNOSIS — Z853 Personal history of malignant neoplasm of breast: Secondary | ICD-10-CM | POA: Diagnosis not present

## 2022-05-02 DIAGNOSIS — R928 Other abnormal and inconclusive findings on diagnostic imaging of breast: Secondary | ICD-10-CM | POA: Diagnosis not present

## 2022-05-04 ENCOUNTER — Encounter: Payer: Medicare Other | Admitting: *Deleted

## 2022-05-06 ENCOUNTER — Encounter: Payer: Self-pay | Admitting: Oncology

## 2022-05-09 ENCOUNTER — Ambulatory Visit: Payer: Self-pay | Admitting: *Deleted

## 2022-05-09 NOTE — Patient Outreach (Signed)
Care Coordination   Follow Up Visit Note   05/09/2022 Name: Doris Reyes MRN: [id number] DOB: [birth year]  Doris Reyes is a 72 y.o. year old female who sees [name], MD for primary care. I spoke with  [name] by phone today.  What matters to the patients health and wellness today?  "Valentines is going to be hard"    Goals Addressed             This Visit'Doris Progress    Grief support for recent loss of husband       Care Coordination Interventions: Pt reports Doris diagnostic workup showed no cancer after finding breast nodule. Pt relieved!  Pt reports Valentines Day will be hard for me because that'Doris the day I met Doris Reyes. Pt has some activities planned for tomorrow and Valentines Day to help make it an easier/more positive day.  "My church Grief Share group are meeting tomorrow and will then go to leader'Doris home for a meal. On Valentines Day, she will be will also be with friends from church offering support to all the widows.  Pt comforted by the activities and support and yet is realistic to Doris pain and  the challenges and difficulties that remain. She was notified by Hosp[facility] that they will be starting an in-person Grief/Bereavement support group in [place] next month- she plans to attend and share with Doris other local widow friends.    Previous outreach call to pt:  Pt continues to connect with church friends and other friends for support and keeping busy. "Widowhood puts you in a new category"- feeling disconnected from Doris Reyes'Doris friends. CSW suggested she reach out to them and plan a get together which she thinks she may do in the future.  Pt continues to participate in the Grief Share group at church- "went to dinner with one of the girls after the group meeting which she enjoyed.  CSW also discussed ways to cope and remove the worry/anxiety through distractions, deep breathing/meditations,etc.    Pt reports the holidays went better than expected with family  and friends "keeping Doris busy".  Pt plans to begin the "Grief Share" program at Doris church next week- they will meet weekly for support. Pt shared she was able to "write a letter to [employer] pt admits she wrote a lot more than she thought she would- she now plans to start journaling too- CSW commended Doris on these positive steps for coping and processing Doris loss.  Pt feels she is "crying less" and finding Doris "new normal". She is relieved to have gotten through the holidays and is pleased with the headstone made-   CSW talked with pt about the positive st eps she is taking to cope with Doris loss- rPt may go visit Doris Reyes on Christmas morning if she feels up to it- she is anticipating Christmas morning to be difficult; stating, "that was our time".... Pt previously shared with CSW the recent loss of Doris husband of 28 years in October. Pt with grief, sadness, loneliness and situational Depression screen reviewed Solution-Focused Strategies employed:  CSW discussed the overall impact grief and loss/depression can cause to the physical, emotional and health wellness and the importance of allowing ourselves to grieve; cry, etc.  Mindfulness or Relaxation training provided Active listening / Reflection utilized  Emotional Support Provided Provided general psycho-education for mental health needs  Reviewed mental health medications and discussed importance of compliance: Pt reports taking Cymbalta for Doris Fibromyalgia and Xanax  PRN Participation in counseling encouraged : Pt plans to seek providers in-network with Doris [employer] insurance- as well as consider local hospice offerings Suicidal Ideation/Homicidal Ideation assessed:denies SI/HI Encouraged pt to continue to reach out and engage with friends, family and church friends who are supportive and she states; "my people have kept me busy and helped me get through this so far" Reminded pt of the 87 # for non-emergent mental health support Pt voices  appreciation of CSW outreach and supportive counseling offered          SDOH assessments and interventions completed:  Yes     Care Coordination Interventions:  Yes, provided   Follow up plan: Follow up call scheduled for 06/01/22    Encounter Outcome:  Pt. Visit Completed

## 2022-05-09 NOTE — Patient Instructions (Signed)
Visit Information  Thank you for taking time to visit with me today. Please don't hesitate to contact me if I can be of assistance to you.   Following are the goals we discussed today:   Goals Addressed             This Visit's Progress    Grief support for recent loss of husband       Care Coordination Interventions: Pt reports her diagnostic workup showed no cancer after finding breast nodule. Pt relieved!  Pt reports Valentines Day will be hard for me because that's the day I met Bucky. Pt has some activities planned for tomorrow and Valentines Day to help make it an easier/more positive day.  "My church Grief Share group are meeting tomorrow and will then go to leader's home for a meal. On Valentines Day, she will be will also be with friends from church offering support to all the widows.  Pt comforted by the activities and support and yet is realistic to her pain and  the challenges and difficulties that remain. She was notified by Hosp[facility] that they will be starting an in-person Grief/Bereavement support group in [place] next month- she plans to attend and share with her other local widow friends.    Previous outreach call to pt:  Pt continues to connect with church friends and other friends for support and keeping busy. "Widowhood puts you in a new category"- feeling disconnected from [name]. CSW suggested she reach out to them and plan a get together which she thinks she may do in the future.  Pt continues to participate in the Grief Share group at church- "went to dinner with one of the girls after the group meeting which she enjoyed.  CSW also discussed ways to cope and remove the worry/anxiety through distractions, deep breathing/meditations,etc.    Pt reports the holidays went better than expected with family and friends "keeping her busy".  Pt plans to begin the "Grief Share" program at her church next week- they will meet weekly for support. Pt shared she was able  to "write a letter to [employer] pt admits she wrote a lot more than she thought she would- she now plans to start journaling too- CSW commended her on these positive steps for coping and processing her loss.  Pt feels she is "crying less" and finding her "new normal". She is relieved to have gotten through the holidays and is pleased with the headstone made-   CSW talked with pt about the positive st eps she is taking to cope with her loss- rPt may go visit her 59yo aunt on Christmas morning if she feels up to it- she is anticipating Christmas morning to be difficult; stating, "that was our time".... Pt previously shared with CSW the recent loss of her husband of 28 years in October. Pt with grief, sadness, loneliness and situational Depression screen reviewed Solution-Focused Strategies employed:  CSW discussed the overall impact grief and loss/depression can cause to the physical, emotional and health wellness and the importance of allowing ourselves to grieve; cry, etc.  Mindfulness or Relaxation training provided Active listening / Reflection utilized  Emotional Support Provided Provided general psycho-education for mental health needs  Reviewed mental health medications and discussed importance of compliance: Pt reports taking Cymbalta for her Fibromyalgia and Xanax PRN Participation in counseling encouraged : Pt plans to seek providers in-network with her [employer] insurance- as well as consider local hospice offerings Suicidal Ideation/Homicidal Ideation assessed:denies SI/HI Encouraged pt  to continue to reach out and engage with friends, family and church friends who are supportive and she states; "my people have kept me busy and helped me get through this so far" Reminded pt of the 988 # for non-emergent mental health support Pt voices appreciation of CSW outreach and supportive counseling offered          Our next appointment is by telephone on 06/01/22   Please call the care guide team  at [phone] if you need to cancel or reschedule your appointment.   If you are experiencing a Mental Health or [place] or need someone to talk to, please call the Suicide and Crisis Lifeline: 988 call the [place] National Suicide Prevention Lifeline: 602-047-2191 or TTY: 780-719-6936 TTY 970-358-3061) to talk to a trained counselor call 911   Patient verbalizes understanding of instructions and care plan provided today and agrees to view in [place]. Active MyChart status and patient understanding of how to access instructions and care plan via MyChart confirmed with patient.     Telephone follow up appointment with care management team member scheduled for: 06/01/22 Eduard Clos, MSW, [place] Worker [facility] [phone]

## 2022-05-12 ENCOUNTER — Ambulatory Visit: Payer: Medicare Other | Admitting: Oncology

## 2022-05-18 ENCOUNTER — Encounter: Payer: Self-pay | Admitting: Oncology

## 2022-05-24 ENCOUNTER — Telehealth: Payer: Self-pay

## 2022-05-24 NOTE — Telephone Encounter (Signed)
Attempted to contact patient. No answer. 

## 2022-05-24 NOTE — Telephone Encounter (Signed)
-----   Message from [name], MD sent at 05/09/2022  6:04 PM EST ----- Regarding: call I am glad the breast imaging turned out okay. Her arm xray was negative, I think she will need MRI of the shoulder.  Has she called the orthopedic doc yet?

## 2022-06-01 ENCOUNTER — Ambulatory Visit: Payer: Self-pay | Admitting: *Deleted

## 2022-06-01 NOTE — Patient Instructions (Signed)
Visit Information  Thank you for taking time to visit with me today. Please don't hesitate to contact me if I can be of assistance to you.   Following are the goals we discussed today:   Goals Addressed             This Visit's Progress    Grief support for recent loss of husband        Activities and task to complete in order to accomplish goals.   EMOTIONAL / [place]- continue with your "Grief Share" group at church and begin attending the new support group with [facility] next week Start / continue relaxed breathing 3 times daily Continue to allow yourself to grieve and feel the emotions with that Continue with positive self care; giving yourself grace  Continue with your efforts to complete legal/estate matters as well as selling things/getting rid of things no longer needed Keep all upcoming appointment discussed today Continue with compliance of taking medication prescribed by Doctor Keep listening for Bucky's voice- so glad you heard his voice as you dreamed about him and awoke today Find actions/people to help distract you if you find yourself feeling down/in a hole Call 988 for 24/7 hotline support        Our next appointment is by telephone on 06/30/22   Please call the care guide team at [phone] if you need to cancel or reschedule your appointment.   If you are experiencing a Mental Health or [place] or need someone to talk to, please call the Suicide and Crisis Lifeline: 988 call the [place] National Suicide Prevention Lifeline: 802-346-1400 or TTY: 938-140-1741 TTY 909-739-4537) to talk to a trained counselor call 911   The patient verbalized understanding of instructions, educational materials, and care plan provided today and DECLINED offer to receive copy of patient instructions, educational materials, and care plan.   Telephone follow up appointment with care management team member scheduled for:06/30/22  Eduard Clos, MSW, [place] Worker [facility] [phone]

## 2022-06-01 NOTE — Patient Outreach (Signed)
  Care Coordination   Follow Up Visit Note   06/01/2022 Name: Doris Reyes MRN: [id number] DOB: November 16, 1950  Doris Reyes is a 72 y.o. year old female who sees [name], MD for primary care. I spoke with  [name] by phone today.  What matters to the patients health and wellness today?  "Doing better"     06/01/2022    2:23 PM 02/01/2022   10:04 AM  Depression screen PHQ 2/9  Decreased Interest 1 1  Down, Depressed, Hopeless 1 1  PHQ - 2 Score 2 2  Altered sleeping 1 2  Tired, decreased energy 1 1  Change in appetite 1 1  Feeling bad or failure about yourself  1 0  Trouble concentrating 0 1  Moving slowly or fidgety/restless 1 0  Suicidal thoughts 0 0  PHQ-9 Score 7 7  Difficult doing work/chores  Not difficult at all     Goals Addressed   None     SDOH assessments and interventions completed:  Yes  SDOH Interventions Today    Flowsheet Row Most Recent Value  SDOH Interventions   Depression Interventions/Treatment  Medication, Counseling        Care Coordination Interventions:  Yes, provided   Interventions Today    Flowsheet Row Most Recent Value  Chronic Disease   Chronic disease during today's visit Other  [depression/grief and loss]  General Interventions   General Interventions Discussed/Reviewed Community Resources  Mental Health Interventions   Mental Health Discussed/Reviewed Mental Health Discussed, Mental Health Reviewed, Coping Strategies, Depression, Grief and Loss        Follow up plan: Follow up call scheduled for 06/30/22    Encounter Outcome:  Pt. Visit Completed

## 2022-06-03 ENCOUNTER — Telehealth: Payer: Self-pay

## 2022-06-03 NOTE — Telephone Encounter (Signed)
Received call from [name]. [name], Pharm D has reviewed the bone density scan and would like to know if it is ok if pt's PCP explores starting Prolia?

## 2022-06-06 DIAGNOSIS — H04123 Dry eye syndrome of bilateral lacrimal glands: Secondary | ICD-10-CM | POA: Diagnosis not present

## 2022-06-06 DIAGNOSIS — H35372 Puckering of macula, left eye: Secondary | ICD-10-CM | POA: Diagnosis not present

## 2022-06-06 DIAGNOSIS — Z961 Presence of intraocular lens: Secondary | ICD-10-CM | POA: Diagnosis not present

## 2022-06-30 ENCOUNTER — Encounter: Payer: Medicare Other | Admitting: *Deleted

## 2022-06-30 DIAGNOSIS — H04123 Dry eye syndrome of bilateral lacrimal glands: Secondary | ICD-10-CM | POA: Diagnosis not present

## 2022-06-30 DIAGNOSIS — H35372 Puckering of macula, left eye: Secondary | ICD-10-CM | POA: Diagnosis not present

## 2022-07-05 ENCOUNTER — Ambulatory Visit: Payer: Self-pay | Admitting: *Deleted

## 2022-07-05 NOTE — Patient Outreach (Signed)
  Care Coordination   Follow Up Visit Note   07/05/2022 Name: Doris Reyes MRN: [id number] DOB: 1950/12/22  Doris Reyes is a 72 y.o. year old female who sees Johny Blamer, MD for primary care. I spoke with  Artis Flock by phone today.  What matters to the patients health and wellness today?  Pt received a photo today from the person [place]- "he would be so happy"    Goals Addressed             This Visit's Progress    COMPLETED: Grief support for recent loss of husband        Activities and task to complete in order to accomplish goals.   EMOTIONAL / MENTAL HEALTH SUPPORT- continue with your "Grief Share" group at church and begin attending the new support group with [facility] next week Start / continue relaxed breathing 3 times daily Continue to allow yourself to grieve and feel the emotions with that Continue with positive self care; giving yourself grace  Continue with your efforts to complete legal/estate matters as well as selling things/getting rid of things no longer needed Keep all upcoming appointment discussed today Continue with compliance of taking medication prescribed by Doctor Keep listening for Bucky's voice- so glad you heard his voice as you dreamed about him and awoke today Find actions/people to help distract you if you find yourself feeling down/in a hole Call 988 for 24/7 hotline support        SDOH assessments and interventions completed:  Yes     Care Coordination Interventions:  Yes, provided  Interventions Today    Flowsheet Row Most Recent Value  Chronic Disease   Chronic disease during today's visit Other  [grief/loss]  General Interventions   General Interventions Discussed/Reviewed Community Resources  Mental Health Interventions   Mental Health Discussed/Reviewed Mental Health Discussed, Mental Health Reviewed, Coping Strategies, Grief and Loss, Depression  [Pt continues to  cope and process her grief and loss in healthy ways- participating in grief share group at church and also hospice support group- pt acknowledges how lucky she feels to have the love and support of family, friends, church and her faith!]       Follow up plan: No further intervention required.   Encounter Outcome:  Pt. Visit Completed

## 2022-07-05 NOTE — Patient Instructions (Signed)
Visit Information  Thank you for taking time to visit with me today. Please don't hesitate to contact me if I can be of assistance to you.   Following are the goals we discussed today:   Goals Addressed             This Visit's Progress    COMPLETED: Grief support for recent loss of husband        Activities and task to complete in order to accomplish goals.   EMOTIONAL / MENTAL HEALTH SUPPORT- continue with your "Grief Share" group at church and begin attending the new support group with [facility] next week Start / continue relaxed breathing 3 times daily Continue to allow yourself to grieve and feel the emotions with that Continue with positive self care; giving yourself grace  Continue with your efforts to complete legal/estate matters as well as selling things/getting rid of things no longer needed Keep all upcoming appointment discussed today Continue with compliance of taking medication prescribed by Doctor Keep listening for Bucky's voice- so glad you heard his voice as you dreamed about him and awoke today Find actions/people to help distract you if you find yourself feeling down/in a hole Call 988 for 24/7 hotline support        If you are experiencing a Mental Health or Behavioral Health Crisis or need someone to talk to, please call the Suicide and Crisis Lifeline: 988 call 911   The patient verbalized understanding of instructions, educational materials, and care plan provided today and DECLINED offer to receive copy of patient instructions, educational materials, and care plan.   No further follow up required:   Reece Levy, MSW, LCSW Clinical Social Worker [facility] [employer] [phone]

## 2022-07-26 ENCOUNTER — Ambulatory Visit: Payer: Medicare Other | Admitting: Oncology

## 2022-07-27 NOTE — Progress Notes (Signed)
Clinic Day: 07/29/22 Referring physician:  Johny Blamer, MD   Assessment & Plan   Invasive lobular carcinoma This was found on screening mammogram but Doris MRI reveals focal enhancement surrounding Doris biopsy clip up to 2.3 cm and there was non-mass enhancement posterior to this known malignancy on MRI.  This area was biopsied in order to guide her ultimate surgery.This is strongly ER/PR positive, HER2 negative and has a low Ki 67 of less than 5%.  She has now had a lumpectomy but had to go back for reexcision of Doris margins.  Doris sentinel lymph node was negative but she did have 2 primaries, measuring 2.4 cm and 2.2 cm, for a T2 N0 M0, stage IIA.  Even though she has several favorable characteristics, such as grade 1 histology and a low Ki 67, I still recommended that we pursue Endopredict testing to quantitate her risk for recurrence, and fortunately she has an EpClin score of 2.6, low risk.  This correlates with a 5.1% risk of distant recurrence in Doris next 10 years. Her benefit from chemotherapy would be 1.0% so we did not pursue. Her risk for a late recurrence is 4.0%. Unfortunately she has refused hormonal therapy.  Strong family history Including multiple females with breast cancer, her mother had colon cancer at age 49, and another maternal aunt had ovarian cancer.  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  She has been counseled on Doris implications of this and is already getting regular colonoscopy.  However Doris information will also be passed on to family members to pursue testing.  Osteoporosis Her spine shows a T score of -2.6 for osteoporosis and Doris right femur has osteopenia.  At Doris very least she should be taking calcium and vitamin D.  Plan  She had her annual diagnostic mammogram 05/02/2022, and there was concern over post surgical architectural distortion and nodularity palpated in Doris upper right breast, upper outer  quadrant.Ultrasound of that area was negative.  I will see her back in 3 months. We discussed getting x-rays of her shoulder to evaluate Doris pain but she feels this is her fibromyalgia. I discussed Doris assessment and treatment plan with Doris Reyes.  She was provided an opportunity to ask questions and all were answered.  Doris Reyes was advised to call back if she has further questions   I provided 15 minutes of face-to-face time during this this encounter and > 50% was spent counseling as documented under my assessment and plan.    Dellia Beckwith, MD [facility] AT [facility] [address] [place] [address] Dept: [phone] Dept Fax: [phone]     CHIEF COMPLAINTS/PURPOSE OF CONSULTATION:  Breast cancer, stage IIA  HISTORY OF PRESENTING ILLNESS:  Doris Reyes 72 y.o. 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  She does have annual mammograms because of her strong family history, but was on hormone replacement therapy for 10 years.  She had a screening mammogram at [facility] on May 05, 2021 which revealed architectural distortion which was felt to be indeterminant.  She had a diagnostic mammogram on March 3 which showed mild nipple retraction and a 1 cm area of focal asymmetry in Doris retroareolar region.  Ultrasound revealed a hypoechoic irregular mass measuring 9 mm in that area.  On March 14 she had a biopsy performed  which revealed a 0.9 cm irregular mass with spiculated margins in Doris right breast at 12:00, approximately 2 cm from Doris nipple and anteriorly in Doris breast.  This was found to be grade 2 invasive mammary carcinoma as well as mammary carcinoma in situ.  Doris E-cadherin immunohistochemistry stains were negative and so this is consistent with invasive lobular carcinoma.  Doris estrogen receptors were positive at 95% and progesterone  receptors positive at 95% with HER2 by immunohistochemistry positive at 2+ but FISH was negative.  Ki-67 was less than 5%.  Doris carcinoma in situ was positive for E-cadherin, consistent with ductal carcinoma in situ.  She had an MRI of Doris breast and was found to have focal enhancement in Doris right breast surrounding Doris biopsy clip and some non-mass enhancement posterior to Doris malignancy.  It was recommended that she have an MRI guided biopsy of Doris clumped non-mass enhancement area and this revealed invasive lobular carcinoma grade 1 with lobular carcinoma in situ and extensive fibrocystic changes with focal intraluminal microcalcification and intraductal papillomatosis.  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  This has been explained to her that she may have some increased risk for colon cancer but Doris main concern is that she is a carrier with heterozygous mutation.  It was recommended that she have colonoscopy beginning at age 33 or 10 years prior to Doris age of her first-degree use relatives age at colorectal cancer diagnosis.  It was recommended this be repeated every 5 years.  This was explained to her.  She has now had a lumpectomy but had to go back for reexcision of Doris margins.  Doris sentinel lymph node was negative but she did have 2 primaries, measuring 2.4 cm and 2.2 cm, for a T2 N0 M0, stage IIA. This was ER/PR positive, HER 2 negative and a Ki-67 was 5%.  Even though she has several favorable characteristics, such as grade 1 histology and a low Ki 67, I still recommended that we pursue Endopredict testing to quantitate her risk for recurrence, and fortunately she has an EpClin score of 2.6, low risk. This correlates with a 5.1% risk of distant recurrence in Doris next 10 years, with only a 1.0% benefit of chemotherapy.  Her risk of late recurrence is 4.0%. I recommended hormonal therapy but she refused.  I reviewed her records extensively and collaborated Doris history with Doris Reyes.  SUMMARY OF ONCOLOGIC HISTORY: Oncology History  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)  06/08/2021 Initial Diagnosis   Breast cancer in female [facility])   07/23/2021 Genetic Testing   Negative hereditary cancer genetic testing: no pathogenic variants detected in [facility] BRCAPlus Panel. Report date is July 23, 2021.  MUTYH [id number] single pathogenic mutation identified on Doris CancerNext-Expanded+RNAinsight panel.  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  Doris report date is Jul 26, 2021.  Doris BRCAplus panel offered by [facility] [facility] and includes sequencing and deletion/duplication analysis for Doris following 8 genes: ATM, BRCA1, BRCA2, CDH1, CHEK2, PALB2, PTEN, and TP53.  Results of pan-cancer panel pending.   Doris CancerNext-Expanded gene panel offered by [facility] and includes sequencing and rearrangement analysis for Doris following 77 genes: AIP, ALK, APC*, ATM*, AXIN2, BAP1, BARD1, BLM, BMPR1A, BRCA1*, BRCA2*, BRIP1*, CDC73, CDH1*, CDK4, CDKN1B, CDKN2A, CHEK2*,  CTNNA1, DICER1, FANCC, FH, FLCN, GALNT12, KIF1B, LZTR1, MAX, MEN1, MET, MLH1*, MSH2*, MSH3, MSH6*, MUTYH*, NBN, NF1*, NF2, NTHL1, PALB2*, PHOX2B, PMS2*, POT1, PRKAR1A, PTCH1, PTEN*, RAD51C*, RAD51D*, RB1, RECQL, RET, SDHA, SDHAF2, SDHB, SDHC, SDHD, SMAD4, SMARCA4, SMARCB1, SMARCE1, STK11, SUFU, TMEM127, TP53*, TSC1, TSC2, VHL and XRCC2 (sequencing and deletion/duplication); EGFR, EGLN1, HOXB13, KIT, MITF, PDGFRA, POLD1, and POLE (sequencing only); EPCAM and GREM1 (deletion/duplication only). DNA and RNA analyses performed for * genes.    08/23/2021 Cancer Staging   Staging form: Breast, AJCC 8th Edition - Pathologic stage from 08/23/2021: Stage IA (pT2(2), pN0(sn), cM0, G1, ER+, PR+, HER2-) - Signed by Dellia Beckwith, MD on  09/29/2021 Histopathologic type: Lobular carcinoma, NOS Stage prefix: Initial diagnosis Method of lymph node assessment: Sentinel lymph node biopsy Nuclear grade: G1 Multigene prognostic tests performed: EndoPredict Histologic grading system: 3 grade system Residual tumor (R): R0 - None Laterality: Right Tumor size (mm): 24 Multiple tumors: Yes Number of tumors: 2 Lymph-vascular invasion (LVI): LVI not present (absent)/not identified Diagnostic confirmation: Positive histology PLUS positive immunophenotyping and/or positive genetic studies Specimen type: Excision Staged by: Managing physician Menopausal status: Postmenopausal Ki-67 (%): 5 Stage used in treatment planning: Yes National guidelines used in treatment planning: Yes Type of national guideline used in treatment planning: NCCN   In terms of breast cancer risk profile:  She menarched at early age of [age] and went to surgical menopause at age 19 She had 1 pregnancy, her first child was born at age 37 She was exposed hormone replacement therapy for 10 years in Doris form of a patch.  She has significant positive family history of Breast and ovarian and colon cancer  INTERVAL HISTORY: Doris Reyes is here today for a follow up for breast cancer stage IIA. She was recently widowed. She has had pain of her right shoulder and we discussed getting x-rays. She feels this is her fibromyalgia. She had her annual diagnostic mammogram 05/02/2022, and there was concern over post surgical architectural distortion and nodularity palpated in Doris upper right breast, upper outer quadrant.Ultrasound of that area was negative.Breast examination still reveals nodularity in Doris upper inner quadrant of Doris right breast which is stable, and still some persistent blue dye lateral to Doris right nipple. She has mild fibrocystic changes as well.  I will see her back in 3 months. She denies signs of infection such as sore throat, sinus drainage, cough, or urinary  symptoms.  She denies fevers or recurrent chills. She denies pain. She denies nausea, vomiting, chest pain, dyspnea or cough. Her appetite is okay and her weight has increased 1 pounds over last 3  months .    MEDICAL HISTORY:  Past Medical History:  Diagnosis Date   Anxiety    Cancer (HCC)    right breast ILC   Complication of anesthesia    difficulty waking up   Depression    Diverticulitis    Family history of breast cancer    Family history of ovarian cancer    Family history of prostate cancer    Fibromyalgia    GERD (gastroesophageal reflux disease)    History of colonic polyps    Hyperlipidemia    Hypertension    IBS (irritable bowel syndrome)    Ischemic colitis (HCC) 03/2012   Osteoporosis    RLS (restless legs syndrome)    Sinusitis    Sleep apnea    CPAP nightly  Vitamin D deficiency Iron deficiency treated with IV iron infusions in 2019  SURGICAL HISTORY:  Past Surgical History:  Procedure Laterality Date   BREAST LUMPECTOMY WITH RADIOACTIVE SEED LOCALIZATION Right 08/04/2021   Procedure: RIGHT BREAST LUMPECTOMY WITH RADIOACTIVE SEED LOCALIZATION;  Surgeon: Harriette Bouillon, MD;  Location: [facility] OR;  Service: General;  Laterality: Right;   CHOLECYSTECTOMY  2004   COLONOSCOPY WITH PROPOFOL N/A 04/18/2017   Procedure: COLONOSCOPY WITH PROPOFOL;  Surgeon: Charna Elizabeth, MD;  Location: [facility];  Service: Endoscopy;  Laterality: N/A;   ESOPHAGOGASTRODUODENOSCOPY (EGD) WITH PROPOFOL N/A 04/18/2017   Procedure: ESOPHAGOGASTRODUODENOSCOPY (EGD) WITH PROPOFOL;  Surgeon: Charna Elizabeth, MD;  Location: [facility];  Service: Endoscopy;  Laterality: N/A;   FLEXIBLE SIGMOIDOSCOPY  04/20/2012   Procedure: FLEXIBLE SIGMOIDOSCOPY;  Surgeon: Theda Belfast, MD;  Location: [facility];  Service: Endoscopy;  Laterality: N/A;   NISSEN FUNDOPLICATION  2011   PARAESOPHAGEAL HERNIA REPAIR  09/11/2009   and Nissen fundoplication   RE-EXCISION OF BREAST LUMPECTOMY Right 08/18/2021    Procedure: RE-EXCISION RIGHT BREAST LUMPECTOMY;  Surgeon: Harriette Bouillon, MD;  Location: [facility];  Service: General;  Laterality: Right;   SENTINEL NODE BIOPSY N/A 08/04/2021   Procedure: SENTINEL NODE BIOPSY;  Surgeon: Harriette Bouillon, MD;  Location: [facility] OR;  Service: General;  Laterality: N/A;   TONSILLECTOMY  1960's   TOTAL ABDOMINAL HYSTERECTOMY  2001  She had 2 large benign tumors of Doris ovaries removed with bilateral salpingo oophorectomy  SOCIAL HISTORY: Social History   Socioeconomic History   Marital status: Widowed    Spouse name: Fayrene Fearing   Number of children: 1   Years of education: Not on file   Highest education level: Some college, no degree  Occupational History   Occupation: disabled  Tobacco Use   Smoking status: Never   Smokeless tobacco: Never   Tobacco comments:    brief exposure through her husband  Vaping Use   Vaping Use: Never used  Substance and Sexual Activity   Alcohol use: Never    Alcohol/week: 0.0 standard drinks of alcohol   Drug use: Never   Sexual activity: Not Currently  Other Topics Concern   Not on file  Social History Narrative   From [place] originally. Previously lived in [place]. Previously worked at [employer] also in a [employer]. Has also worked as a Diplomatic Services operational officer for [employer]. No pets currently. No bird exposure. No known mold in her current home.    Lives with husband   Caffeine- coffee 2 c daily   Social Determinants of Health   Financial Resource Strain: Low Risk  (02/01/2022)   Overall Financial Resource Strain (CARDIA)    Difficulty of Paying Living Expenses: Not hard at all  Food Insecurity: No Food Insecurity (02/01/2022)   Hunger Vital Sign    Worried About Running Out of Food in Doris Last Year: Never true    Ran Out of Food in Doris Last Year: Never true  Transportation Needs: No Transportation Needs (02/01/2022)   PRAPARE - Administrator, Civil Service (Medical): No    Lack of  Transportation (Non-Medical): No  Physical Activity: Not on file  Stress: Not on file  Social Connections: Not on file  Intimate Partner Violence: Not At Risk (02/01/2022)   Humiliation, Afraid, Rape, and Kick questionnaire    Fear of Current or Ex-Partner: No    Emotionally Abused: No    Physically Abused: No    Sexually Abused: No   Doris Reyes is here with her husband Rexanne Mano and daughter Hospital doctor today.  She has never  smoked and does not drink alcohol or use drugs. FAMILY HISTORY: Family History  Problem Relation Age of Onset   Colon cancer Mother 110   Alzheimer's disease Mother    Heart attack Father    Cancer Maternal Aunt        x2   Alzheimer's disease Maternal Aunt    Ovarian cancer Maternal Aunt    Prostate cancer Maternal Uncle    Kidney disease Maternal Uncle    Atrial fibrillation Maternal Uncle    Stroke Paternal Uncle    Aneurysm Paternal Grandmother        brain   Stroke Paternal Grandfather    Prostate cancer Cousin        paternal first cousin   Prostate cancer Cousin        paternal first cousin   Esophageal cancer Cousin        maternal first cousin   Breast cancer Cousin        DCIS, maternal first cousin   Ovarian cancer Cousin        maternal first cousin   Rheum arthritis Other    Lung disease Neg Hx   Ovarian cancer                                               maternal aunt                         54's Breast cancer                                                 cousin                                     69's Prostate cancer                                              maternal uncle                        76's  ALLERGIES:  is allergic to ceftin [cefuroxime axetil], cefuroxime, pneumococcal vac polyvalent, pneumococcal vaccine, tramadol, and tramadol hcl.  MEDICATIONS:  Current Outpatient Medications  Medication Sig Dispense Refill   cyanocobalamin (VITAMIN B12) 500 MCG tablet Take 500 mcg by mouth daily.     acetaminophen (TYLENOL) 650 MG CR  tablet 1,300 mg in Doris morning and at bedtime.     ALPRAZolam (XANAX) 0.25 MG tablet Take 0.25 mg by mouth at bedtime as needed for anxiety.     amLODipine (NORVASC) 5 MG tablet Take 5 mg by mouth daily.     Ascorbic Acid (VITAMIN C) 500 MG CHEW Chew 500 mg by mouth daily.     aspirin EC 81 MG tablet Take 81 mg by mouth daily. Swallow whole.     atenolol (TENORMIN) 50 MG tablet Take 75 mg by mouth daily.      benazepril (LOTENSIN) 10 MG tablet Take 10 mg by mouth daily.  benzonatate (TESSALON) 100 MG capsule Take 100-200 mg by mouth at bedtime.     Biotin 1000 MCG tablet Take 1,000 mcg by mouth daily.     Calcium Citrate-Vitamin D (CITRACAL + D PO) Take 1 tablet by mouth daily.     cetirizine (ZYRTEC) 10 MG tablet Take 10 mg by mouth daily as needed for allergies.      Cholecalciferol (VITAMIN D) 1000 UNITS capsule Take 2,000 Units by mouth daily.      cyclobenzaprine (FLEXERIL) 5 MG tablet Take 5 mg by mouth 3 (three) times daily as needed for muscle spasms.     diclofenac sodium (VOLTAREN) 1 % GEL Apply 1 application topically daily as needed (for pain). Apply as directed     dicyclomine (BENTYL) 10 MG capsule Take 10 mg by mouth 3 (three) times daily as needed for spasms.     diphenhydrAMINE (BENADRYL) 25 MG tablet Take 25 mg by mouth every 6 (six) hours as needed for allergies.     diphenoxylate-atropine (LOMOTIL) 2.5-0.025 MG tablet Take 2 tablets by mouth 4 (four) times daily as needed for diarrhea or loose stools.     doxycycline (VIBRA-TABS) 100 MG tablet Take 100 mg by mouth 2 (two) times daily.     DULoxetine (CYMBALTA) 60 MG capsule Take 120 mg by mouth daily.      EPINEPHrine (EPIPEN 2-PAK) 0.3 mg/0.3 mL IJ SOAJ injection See admin instructions.     famotidine (PEPCID) 20 MG tablet Take 20 mg by mouth at bedtime.     fexofenadine (ALLEGRA) 180 MG tablet Take 180 mg by mouth daily.     fluticasone (FLONASE) 50 MCG/ACT nasal spray Place 1 spray into both nostrils daily.      Nutritional Supplements (JUICE PLUS FIBRE PO) Take 2 each by mouth daily. Fruits and Vegetables     Omega-3 Fatty Acids (FISH OIL) 1000 MG CAPS Take 1,000 mg by mouth daily.      ondansetron (ZOFRAN) 4 MG tablet Take 4 mg by mouth every 8 (eight) hours as needed for nausea or vomiting.     oxyCODONE (OXY IR/ROXICODONE) 5 MG immediate release tablet Take 1 tablet (5 mg total) by mouth every 6 (six) hours as needed for severe pain. 15 tablet 0   pantoprazole (PROTONIX) 40 MG tablet Take 40 mg by mouth daily.      Phenylephrine-APAP-Guaifenesin (TYLENOL SINUS SEVERE PO) Take 2 tablets by mouth daily as needed (for congestion).     polyvinyl alcohol (LIQUIFILM TEARS) 1.4 % ophthalmic solution Place 1 drop into both eyes as needed for dry eyes.     Probiotic Product (PROBIOTIC & ACIDOPHILUS EX ST PO) Take 1 capsule by mouth daily. Ultra Flora Plus Capsules     promethazine-dextromethorphan (PROMETHAZINE-DM) 6.25-15 MG/5ML syrup Take 5 mLs by mouth every 6 (six) hours as needed.     Propylene Glycol (SYSTANE BALANCE) 0.6 % SOLN Place 1 drop into both eyes daily.     rOPINIRole (REQUIP) 0.5 MG tablet Take 0.5 mg by mouth at bedtime.     simvastatin (ZOCOR) 10 MG tablet Take 10 mg by mouth at bedtime.       sucralfate (CARAFATE) 1 GM/10ML suspension Take 1 g by mouth 4 (four) times daily -  with meals and at bedtime.     traZODone (DESYREL) 50 MG tablet Take 50 mg by mouth at bedtime.     vitamin E 400 UNIT capsule Take 400 Units by mouth daily.       No  current facility-administered medications for this visit.    REVIEW OF SYSTEMS:   Review of Systems  Constitutional: Negative.  Negative for appetite change, chills, diaphoresis, fatigue, fever and unexpected weight change.  HENT:  Negative.  Negative for hearing loss, lump/mass, mouth sores, nosebleeds, sore throat, tinnitus, trouble swallowing and voice change.   Eyes: Negative.  Negative for eye problems and icterus.  Respiratory: Negative.   Negative for chest tightness, cough, hemoptysis, shortness of breath and wheezing.   Cardiovascular: Negative.  Negative for chest pain, leg swelling and palpitations.  Gastrointestinal: Negative.  Negative for abdominal distention, abdominal pain, blood in stool, constipation, diarrhea, nausea, rectal pain and vomiting.  Endocrine: Negative.   Genitourinary: Negative.  Negative for bladder incontinence, difficulty urinating, dyspareunia, dysuria, frequency, hematuria, menstrual problem, nocturia, pelvic pain, vaginal bleeding and vaginal discharge.   Musculoskeletal:  Positive for arthralgias (right shoulder). Negative for back pain, flank pain, gait problem, myalgias, neck pain and neck stiffness.  Skin: Negative.  Negative for itching, rash and wound.  Neurological:  Negative for dizziness, extremity weakness, gait problem, headaches, light-headedness, numbness, seizures and speech difficulty.  Hematological: Negative.  Negative for adenopathy. Does not bruise/bleed easily.  Psychiatric/Behavioral: Negative.  Negative for confusion, decreased concentration, depression, sleep disturbance and suicidal ideas. Doris Reyes is not nervous/anxious.    PHYSICAL EXAMINATION: ECOG PERFORMANCE STATUS: 0 - Asymptomatic Physical Exam Vitals and nursing note reviewed.  Constitutional:      General: She is not in acute distress.    Appearance: Normal appearance. She is normal weight. She is not ill-appearing, toxic-appearing or diaphoretic.  HENT:     Head: Normocephalic and atraumatic.     Right Ear: Tympanic membrane, ear canal and external ear normal. There is no impacted cerumen.     Left Ear: Tympanic membrane, ear canal and external ear normal. There is no impacted cerumen.     Nose: Nose normal. No congestion or rhinorrhea.     Mouth/Throat:     Mouth: Mucous membranes are moist.     Pharynx: Oropharynx is clear. No oropharyngeal exudate or posterior oropharyngeal erythema.  Eyes:     General:  No scleral icterus.       Right eye: No discharge.        Left eye: No discharge.     Extraocular Movements: Extraocular movements intact.     Conjunctiva/sclera: Conjunctivae normal.     Pupils: Pupils are equal, round, and reactive to light.  Neck:     Vascular: No carotid bruit.  Cardiovascular:     Rate and Rhythm: Normal rate and regular rhythm.     Pulses: Normal pulses.     Heart sounds: Normal heart sounds. No murmur heard.    No friction rub. No gallop.  Pulmonary:     Effort: Pulmonary effort is normal. No respiratory distress.     Breath sounds: Normal breath sounds. No stridor. No wheezing, rhonchi or rales.  Chest:     Chest wall: No tenderness.     Comments: No palpable mass in either breast.   She has a healing incision of Doris central right breast. Mild scarring of Doris right areolar complex and some nodularity in Doris superior right breast upper outer quadrant.    Abdominal:     General: Bowel sounds are normal. There is no distension.     Palpations: Abdomen is soft. There is no hepatomegaly, splenomegaly or mass.     Tenderness: There is no abdominal tenderness. There is no right CVA  tenderness, left CVA tenderness, guarding or rebound.     Hernia: No hernia is present.  Musculoskeletal:        General: No swelling, deformity or signs of injury.     Right shoulder: Tenderness (glenoid) present. Decreased range of motion.     Cervical back: Normal range of motion and neck supple. No rigidity or tenderness.     Right lower leg: No edema.     Left lower leg: No edema.  Lymphadenopathy:     Cervical: No cervical adenopathy.     Right cervical: No superficial, deep or posterior cervical adenopathy.    Left cervical: No superficial, deep or posterior cervical adenopathy.     Upper Body:     Right upper body: No supraclavicular, axillary or pectoral adenopathy.     Left upper body: No supraclavicular, axillary or pectoral adenopathy.  Skin:    General: Skin is warm  and dry.     Coloration: Skin is not jaundiced or pale.     Findings: No bruising, erythema, lesion or rash.  Neurological:     General: No focal deficit present.     Mental Status: She is alert and oriented to person, place, and time. Mental status is at baseline.     Cranial Nerves: No cranial nerve deficit.     Sensory: No sensory deficit.     Motor: No weakness.     Coordination: Coordination normal.     Gait: Gait normal.     Deep Tendon Reflexes: Reflexes normal.  Psychiatric:        Mood and Affect: Mood normal.        Behavior: Behavior normal.        Thought Content: Thought content normal.        Judgment: Judgment normal.    LABORATORY DATA:  I have reviewed Doris data as listed Lab Results  Component Value Date   WBC 6.6 07/28/2021   HGB 13.6 07/28/2021   HCT 42.1 07/28/2021   MCV 89.4 07/28/2021   PLT 266 07/28/2021   Lab Results  Component Value Date   NA 140 07/28/2021   K 4.3 07/28/2021   CL 105 07/28/2021   CO2 27 07/28/2021   Component Ref Range & Units 9 mo ago (07/28/21) 9 mo ago (06/30/21)  Sodium 135 - 145 mmol/L 140 140 R  Potassium 3.5 - 5.1 mmol/L 4.3 4.2 R  Chloride 98 - 111 mmol/L 105 105 R  CO2 22 - 32 mmol/L 27 25 Abnormal  R  Glucose, Bld 70 - 99 mg/dL 478 High  295 R  BUN 8 - 23 mg/dL 15 16 R  Creatinine, Ser 0.44 - 1.00 mg/dL 6.21 0.7 R  Calcium 8.9 - 10.3 mg/dL 9.7     RADIOGRAPHIC STUDIES: No results found.      I,Jasmine M Lassiter,acting as a scribe for Dellia Beckwith, MD.,have documented all relevant documentation on Doris behalf of Dellia Beckwith, MD,as directed by  Dellia Beckwith, MD while in Doris presence of Dellia Beckwith, MD.

## 2022-07-29 ENCOUNTER — Encounter: Payer: Self-pay | Admitting: Oncology

## 2022-07-29 ENCOUNTER — Inpatient Hospital Stay: Payer: Medicare Other | Attending: Oncology | Admitting: Oncology

## 2022-07-29 VITALS — BP 128/78 | HR 58 | Temp 98.0°F | Resp 18 | Ht 63.0 in | Wt 213.5 lb

## 2022-07-29 DIAGNOSIS — M810 Age-related osteoporosis without current pathological fracture: Secondary | ICD-10-CM | POA: Diagnosis not present

## 2022-07-29 DIAGNOSIS — C50111 Malignant neoplasm of central portion of right female breast: Secondary | ICD-10-CM

## 2022-08-16 DIAGNOSIS — R053 Chronic cough: Secondary | ICD-10-CM | POA: Diagnosis not present

## 2022-08-16 DIAGNOSIS — F5101 Primary insomnia: Secondary | ICD-10-CM | POA: Diagnosis not present

## 2022-08-16 DIAGNOSIS — I10 Essential (primary) hypertension: Secondary | ICD-10-CM | POA: Diagnosis not present

## 2022-08-16 DIAGNOSIS — G4733 Obstructive sleep apnea (adult) (pediatric): Secondary | ICD-10-CM | POA: Diagnosis not present

## 2022-08-16 DIAGNOSIS — G259 Extrapyramidal and movement disorder, unspecified: Secondary | ICD-10-CM | POA: Diagnosis not present

## 2022-08-16 DIAGNOSIS — R7303 Prediabetes: Secondary | ICD-10-CM | POA: Diagnosis not present

## 2022-08-16 DIAGNOSIS — F419 Anxiety disorder, unspecified: Secondary | ICD-10-CM | POA: Diagnosis not present

## 2022-08-16 DIAGNOSIS — J309 Allergic rhinitis, unspecified: Secondary | ICD-10-CM | POA: Diagnosis not present

## 2022-08-16 DIAGNOSIS — M797 Fibromyalgia: Secondary | ICD-10-CM | POA: Diagnosis not present

## 2022-08-16 DIAGNOSIS — E7800 Pure hypercholesterolemia, unspecified: Secondary | ICD-10-CM | POA: Diagnosis not present

## 2022-08-16 DIAGNOSIS — K219 Gastro-esophageal reflux disease without esophagitis: Secondary | ICD-10-CM | POA: Diagnosis not present

## 2022-08-30 DIAGNOSIS — Z86010 Personal history of colonic polyps: Secondary | ICD-10-CM | POA: Diagnosis not present

## 2022-08-30 DIAGNOSIS — K219 Gastro-esophageal reflux disease without esophagitis: Secondary | ICD-10-CM | POA: Diagnosis not present

## 2022-08-30 DIAGNOSIS — Z1211 Encounter for screening for malignant neoplasm of colon: Secondary | ICD-10-CM | POA: Diagnosis not present

## 2022-08-30 DIAGNOSIS — Z800 Family history of malignant neoplasm of digestive organs: Secondary | ICD-10-CM | POA: Diagnosis not present

## 2022-08-30 DIAGNOSIS — R194 Change in bowel habit: Secondary | ICD-10-CM | POA: Diagnosis not present

## 2022-08-30 DIAGNOSIS — K5730 Diverticulosis of large intestine without perforation or abscess without bleeding: Secondary | ICD-10-CM | POA: Diagnosis not present

## 2022-09-06 DIAGNOSIS — Z91013 Allergy to seafood: Secondary | ICD-10-CM | POA: Diagnosis not present

## 2022-09-06 DIAGNOSIS — R051 Acute cough: Secondary | ICD-10-CM | POA: Diagnosis not present

## 2022-09-06 DIAGNOSIS — J310 Chronic rhinitis: Secondary | ICD-10-CM | POA: Diagnosis not present

## 2022-10-03 DIAGNOSIS — Z853 Personal history of malignant neoplasm of breast: Secondary | ICD-10-CM | POA: Diagnosis not present

## 2022-10-03 DIAGNOSIS — Z6837 Body mass index (BMI) 37.0-37.9, adult: Secondary | ICD-10-CM | POA: Diagnosis not present

## 2022-10-03 DIAGNOSIS — Z779 Other contact with and (suspected) exposures hazardous to health: Secondary | ICD-10-CM | POA: Diagnosis not present

## 2022-10-03 DIAGNOSIS — Z1272 Encounter for screening for malignant neoplasm of vagina: Secondary | ICD-10-CM | POA: Diagnosis not present

## 2022-10-19 DIAGNOSIS — R87622 Low grade squamous intraepithelial lesion on cytologic smear of vagina (LGSIL): Secondary | ICD-10-CM | POA: Diagnosis not present

## 2022-10-19 DIAGNOSIS — R87629 Unspecified abnormal cytological findings in specimens from vagina: Secondary | ICD-10-CM | POA: Diagnosis not present

## 2022-10-27 NOTE — Progress Notes (Signed)
[facility]  [address] [place],  [place]  [address] [phone]  Clinic Day: 10/28/22 Referring physician:  Johny Blamer, MD   Assessment & Plan   Invasive lobular carcinoma This was found on screening mammogram but the MRI reveals focal enhancement surrounding the biopsy clip up to 2.3 cm and there was non-mass enhancement posterior to this known malignancy on MRI.  This area was biopsied in order to guide her ultimate surgery.This is strongly ER/PR positive, HER2 negative and has a low Ki 67 of less than 5%.  She has now had a lumpectomy but had to go back for reexcision of the margins.  The sentinel lymph node was negative but she did have 2 primaries, measuring 2.4 cm and 2.2 cm, for a T2 N0 M0, stage IIA.  Even though she has several favorable characteristics, such as grade 1 histology and a low Ki 67, I still recommended that we pursue Endopredict testing to quantitate her risk for recurrence, and fortunately she has an EpClin score of 2.6, low risk.  This correlates with a 5.1% risk of distant recurrence in the next 10 years. Her benefit from chemotherapy would be 1.0% so we did not pursue. Her risk for a late recurrence is 4.0%. Unfortunately she has refused hormonal therapy.  Strong family history Including multiple females with breast cancer, her mother had colon cancer at age 97, and another maternal aunt had ovarian cancer.  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  She has been counseled on the implications of this and is already getting regular colonoscopy.  However the information will also be passed on to family members to pursue testing.  Osteoporosis Her spine shows a T score of -2.6 for osteoporosis and the right femur has osteopenia.  At the very least she should be taking calcium and vitamin D.  Abnormal Pap smear She will have a  follow up with a GYN oncologist.  Plan  She informed me that she will have a follow-up with a GYN-oncologist for further evaluation of abnormal vaginal cells. She informed me that she is supposed to have her colonoscopy done this year but can't go through with it at the moment due to trauma from her last procedure and grief from the recent death of her husband. She had her annual diagnostic mammogram 05/02/2022, and there was concern over post surgical architectural distortion and nodularity palpated in the upper right breast, upper outer quadrant. I still feel nodularity in this area but it remains stable and is consistent with scar tissue. Ultrasound of that area was negative. Her labs today are pending. She will be due for a repeat bone density scan next year. I will see her back in 3 months for reevaluation. I discussed the assessment and treatment plan with the patient.  She was provided an opportunity to ask questions and all were answered.  The patient was advised to call back if she has further questions.   I provided 15 minutes of face-to-face time during this this encounter and > 50% was spent counseling as documented under my assessment and plan.    Doris Beckwith, MD [facility] [address] [place] [address] Dept: [phone] Dept Fax: [phone]     CHIEF COMPLAINTS/PURPOSE OF CONSULTATION:  Breast cancer, stage IIA  HISTORY OF PRESENTING ILLNESS:  Doris Reyes 72 y.o. 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  She does have annual mammograms because of her strong family history, but was on hormone replacement therapy for 10 years.  She had a screening mammogram at [facility] on May 05, 2021 which revealed architectural distortion which was felt to be indeterminant.  She had a diagnostic mammogram on March 3 which  showed mild nipple retraction and a 1 cm area of focal asymmetry in the retroareolar region.  Ultrasound revealed a hypoechoic irregular mass measuring 9 mm in that area.  On March 14 she had a biopsy performed which revealed a 0.9 cm irregular mass with spiculated margins in the right breast at 12:00, approximately 2 cm from the nipple and anteriorly in the breast.  This was found to be grade 2 invasive mammary carcinoma as well as mammary carcinoma in situ.  The E-cadherin immunohistochemistry stains were negative and so this is consistent with invasive lobular carcinoma.  The estrogen receptors were positive at 95% and progesterone receptors positive at 95% with HER2 by immunohistochemistry positive at 2+ but FISH was negative.  Ki-67 was less than 5%.  The carcinoma in situ was positive for E-cadherin, consistent with ductal carcinoma in situ.  She had an MRI of the breast and was found to have focal enhancement in the right breast surrounding the biopsy clip and some non-mass enhancement posterior to the malignancy.  It was recommended that she have an MRI guided biopsy of the clumped non-mass enhancement area and this revealed invasive lobular carcinoma grade 1 with lobular carcinoma in situ and extensive fibrocystic changes with focal intraluminal microcalcification and intraductal papillomatosis.  Due to her strong family history including multiple females with breast cancer in her mother with colon cancer at age 34, she did have genetic testing with [facility] and was found to have a carrier mutation of MUTYH.  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  It was recommended that she have colonoscopy beginning at age 63 or 10 years prior to the age of her first-degree use relatives age at colorectal cancer diagnosis.  It was recommended this be repeated every 5 years.  This was explained  to her.  She has now had a lumpectomy but had to go back for reexcision of the margins.  The sentinel lymph node was negative but she did have 2 primaries, measuring 2.4 cm and 2.2 cm, for a T2 N0 M0, stage IIA. This was ER/PR positive, HER 2 negative and a Ki-67 was 5%.  Even though she has several favorable characteristics, such as grade 1 histology and a low Ki 67, I still recommended that we pursue Endopredict testing to quantitate her risk for recurrence, and fortunately she has an EpClin score of 2.6, low risk. This correlates with a 5.1% risk of distant recurrence in the next 10 years, with only a 1.0% benefit of chemotherapy. Her risk of late recurrence is 4.0%. I recommended hormonal therapy but she refused.  I reviewed her records extensively and collaborated the history with the patient.  SUMMARY OF ONCOLOGIC HISTORY: Oncology History  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)  06/08/2021 Initial Diagnosis   Breast cancer in female [facility])   07/23/2021 Genetic Testing   Negative hereditary cancer genetic testing: no pathogenic variants detected in [facility]CAPl[facility] Panel. Report date is July 23, 2021.  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  The report date is Jul 26, 2021.  The BRCAplus panel offered by [employer] and includes sequencing and deletion/duplication analysis for the following 8 genes: ATM, BRCA1, BRCA2, CDH1, CHEK2, PALB2, PTEN, and TP53.  Results of pan-cancer panel pending.   The CancerNext-Expanded gene panel offered by [facility] and includes sequencing and rearrangement analysis for the following 77 genes: AIP, ALK, APC*, ATM*, AXIN2, BAP1, BARD1, BLM, BMPR1A, BRCA1*, BRCA2*, BRIP1*, CDC73, CDH1*, CDK4, CDKN1B, CDKN2A, CHEK2*, CTNNA1, DICER1, FANCC, FH, FLCN, GALNT12, KIF1B, LZTR1, MAX, MEN1, MET, MLH1*, MSH2*, MSH3, MSH6*,  MUTYH*, NBN, NF1*, NF2, NTHL1, PALB2*, PHOX2B, PMS2*, POT1, PRKAR1A, PTCH1, PTEN*, RAD51C*, RAD51D*, RB1, RECQL, RET, SDHA, SDHAF2, SDHB, SDHC, SDHD, SMAD4, SMARCA4, SMARCB1, SMARCE1, STK11, SUFU, TMEM127, TP53*, TSC1, TSC2, VHL and XRCC2 (sequencing and deletion/duplication); EGFR, EGLN1, HOXB13, KIT, MITF, PDGFRA, POLD1, and POLE (sequencing only); EPCAM and GREM1 (deletion/duplication only). DNA and RNA analyses performed for * genes.    08/23/2021 Cancer Staging   Staging form: Breast, AJCC 8th Edition - Pathologic stage from 08/23/2021: Stage IA (pT2(2), pN0(sn), cM0, G1, ER+, PR+, HER2-) - Signed by Doris Beckwith, MD on 09/29/2021 Histopathologic type: Lobular carcinoma, NOS Stage prefix: Initial diagnosis Method of lymph node assessment: Sentinel lymph node biopsy Nuclear grade: G1 Multigene prognostic tests performed: EndoPredict Histologic grading system: 3 grade system Residual tumor (R): R0 - None Laterality: Right Tumor size (mm): 24 Multiple tumors: Yes Number of tumors: 2 Lymph-vascular invasion (LVI): LVI not present (absent)/not identified Diagnostic confirmation: Positive histology PLUS positive immunophenotyping and/or positive genetic studies Specimen type: Excision Staged by: Managing physician Menopausal status: Postmenopausal Ki-67 (%): 5 Stage used in treatment planning: Yes National guidelines used in treatment planning: Yes Type of national guideline used in treatment planning: NCCN   In terms of breast cancer risk profile:  She menarched at early age of 62-13 and went to surgical menopause at age 89 She had 1 pregnancy, her first child was born at age 48 She was exposed hormone replacement therapy for 10 years in the form of a patch.  She has significant positive family history of Breast and ovarian and colon cancer  INTERVAL HISTORY: Akira is here today for a follow up for breast cancer stage IIA. Patient states that she feels well and complains of  left foot arthritic pain. She informed me that she will have a follow-up with a GYN-oncologist  for further evaluation of abnormal vaginal cells.  She informed me that she is supposed to have her colonoscopy done this year but can't go through with it at the moment due to trauma from her last procedure and grief from the recent death of her husband. She had her annual diagnostic mammogram 05/02/2022, and there was concern over post surgical architectural distortion and nodularity palpated in the upper right breast, upper outer quadrant. I still feel nodularity in this area but it remains stable and is consistent with scar tissue. Ultrasound of that area was negative. Her labs today are pending. She will be due for a repeat bone density scan next year. I will see her back in 3 months for reevaluation.   She denies signs of infection such as sore throat, sinus drainage, cough, or urinary symptoms.  She denies fevers or recurrent chills.  She denies nausea, vomiting, chest pain, dyspnea or cough. Her appetite is wonderful and her  weight has been stable.   MEDICAL HISTORY:  Past Medical History:  Diagnosis Date   Anxiety    Cancer (HCC)    right breast ILC   Complication of anesthesia    difficulty waking up   Depression    Diverticulitis    Family history of breast cancer    Family history of ovarian cancer    Family history of prostate cancer    Fibromyalgia    GERD (gastroesophageal reflux disease)    History of colonic polyps    Hyperlipidemia    Hypertension    IBS (irritable bowel syndrome)    Ischemic colitis (HCC) 03/2012   Osteoporosis    RLS (restless legs syndrome)    Sinusitis    Sleep apnea    CPAP nightly  Vitamin D deficiency Iron deficiency treated with IV iron infusions in 2019  SURGICAL HISTORY: Past Surgical History:  Procedure Laterality Date   BREAST LUMPECTOMY WITH RADIOACTIVE SEED LOCALIZATION Right 08/04/2021   Procedure: RIGHT BREAST LUMPECTOMY WITH RADIOACTIVE  SEED LOCALIZATION;  Surgeon: Harriette Bouillon, MD;  Location: [facility] OR;  Service: General;  Laterality: Right;   CHOLECYSTECTOMY  2004   COLONOSCOPY WITH PROPOFOL N/A 04/18/2017   Procedure: COLONOSCOPY WITH PROPOFOL;  Surgeon: Charna Elizabeth, MD;  Location: [facility] END[facility]COPY;  Service: Endoscopy;  Laterality: N/A;   ESOPHAGOGASTRODUODENOSCOPY (EGD) WITH PROPOFOL N/A 04/18/2017   Procedure: ESOPHAGOGASTRODUODENOSCOPY (EGD) WITH PROPOFOL;  Surgeon: Charna Elizabeth, MD;  Location: [facility];  Service: Endoscopy;  Laterality: N/A;   FLEXIBLE SIGMOIDOSCOPY  04/20/2012   Procedure: FLEXIBLE SIGMOIDOSCOPY;  Surgeon: Theda Belfast, MD;  Location: [facility] END[facility];  Service: Endoscopy;  Laterality: N/A;   NISSEN FUNDOPLICATION  2011   PARAESOPHAGEAL HERNIA REPAIR  09/11/2009   and Nissen fundoplication   RE-EXCISION OF BREAST LUMPECTOMY Right 08/18/2021   Procedure: RE-EXCISION RIGHT BREAST LUMPECTOMY;  Surgeon: Harriette Bouillon, MD;  Location: [facility];  Service: General;  Laterality: Right;   SENTINEL NODE BIOPSY N/A 08/04/2021   Procedure: SENTINEL NODE BIOPSY;  Surgeon: Harriette Bouillon, MD;  Location: [facility] OR;  Service: General;  Laterality: N/A;   TONSILLECTOMY  1960's   TOTAL ABDOMINAL HYSTERECTOMY  2001  She had 2 large benign tumors of the ovaries removed with bilateral salpingo oophorectomy  SOCIAL HISTORY: Social History   Socioeconomic History   Marital status: Widowed    Spouse name: Fayrene Fearing   Number of children: 1   Years of education: Not on file   Highest education level: Some college, no degree  Occupational History   Occupation: disabled  Tobacco Use   Smoking status: Never   Smokeless tobacco: Never   Tobacco comments:    brief exposure through her husband  Vaping Use   Vaping status: Never Used  Substance and Sexual Activity   Alcohol use: Never    Alcohol/week: 0.0 standard drinks of alcohol   Drug use: Never   Sexual activity: Not Currently  Other Topics Concern   Not  on file  Social History Narrative   From [place] originally. Previously lived in [place]. Previously worked at [employer] also in a [employer]. Has also worked as a Diplomatic Services operational officer for [employer]. No pets currently. No bird exposure. No known mold in her current home.    Lives with husband   Caffeine- coffee 2 c daily   Social Determinants of Health   Financial Resource Strain: Low Risk  (02/01/2022)   Overall Financial Resource Strain (CARDIA)    Difficulty of Paying Living  Expenses: Not hard at all  Food Insecurity: No Food Insecurity (02/01/2022)   Hunger Vital Sign    Worried About Running Out of Food in the Last Year: Never true    Ran Out of Food in the Last Year: Never true  Transportation Needs: No Transportation Needs (02/01/2022)   PRAPARE - Administrator, Civil Service (Medical): No    Lack of Transportation (Non-Medical): No  Physical Activity: Not on file  Stress: Not on file  Social Connections: Not on file  Intimate Partner Violence: Not At Risk (02/01/2022)   Humiliation, Afraid, Rape, and Kick questionnaire    Fear of Current or Ex-Partner: No    Emotionally Abused: No    Physically Abused: No    Sexually Abused: No   The patient is here with her husband Rexanne Mano and daughter Hospital doctor today.  She has never smoked and does not drink alcohol or use drugs. FAMILY HISTORY: Family History  Problem Relation Age of Onset   Colon cancer Mother 16   Alzheimer's disease Mother    Heart attack Father    Cancer Maternal Aunt        x2   Alzheimer's disease Maternal Aunt    Ovarian cancer Maternal Aunt    Prostate cancer Maternal Uncle    Kidney disease Maternal Uncle    Atrial fibrillation Maternal Uncle    Stroke Paternal Uncle    Aneurysm Paternal Grandmother        brain   Stroke Paternal Grandfather    Prostate cancer Cousin        paternal first cousin   Prostate cancer Cousin        paternal first cousin   Esophageal cancer Cousin        maternal  first cousin   Breast cancer Cousin        DCIS, maternal first cousin   Ovarian cancer Cousin        maternal first cousin   Rheum arthritis Other    Lung disease Neg Hx   Ovarian cancer                                               maternal aunt                         39's Breast cancer                                                 cousin                                     47's Prostate cancer                                              maternal uncle                        58's  ALLERGIES:  is allergic to ceftin [cefuroxime axetil], cefuroxime, pneumococcal vac  polyvalent, pneumococcal vaccine, tramadol, and tramadol hcl.  MEDICATIONS:  Current Outpatient Medications  Medication Sig Dispense Refill   acetaminophen (TYLENOL) 650 MG CR tablet 1,300 mg in the morning and at bedtime.     ALPRAZolam (XANAX) 0.25 MG tablet Take 0.25 mg by mouth at bedtime as needed for anxiety.     amLODipine (NORVASC) 5 MG tablet Take 5 mg by mouth daily.     Ascorbic Acid (VITAMIN C) 500 MG CHEW Chew 500 mg by mouth daily.     aspirin EC 81 MG tablet Take 81 mg by mouth daily. Swallow whole.     atenolol (TENORMIN) 50 MG tablet Take 75 mg by mouth daily.      benazepril (LOTENSIN) 10 MG tablet Take 10 mg by mouth daily.     benzonatate (TESSALON) 100 MG capsule Take 100-200 mg by mouth at bedtime.     Biotin 1000 MCG tablet Take 1,000 mcg by mouth daily.     Calcium Citrate-Vitamin D (CITRACAL + D PO) Take 1 tablet by mouth daily.     cetirizine (ZYRTEC) 10 MG tablet Take 10 mg by mouth daily as needed for allergies.      Cholecalciferol (VITAMIN D) 1000 UNITS capsule Take 2,000 Units by mouth daily.      cyanocobalamin 1000 MCG tablet Take 1,000 mcg by mouth daily.     cyclobenzaprine (FLEXERIL) 5 MG tablet Take 5 mg by mouth 3 (three) times daily as needed for muscle spasms.     diclofenac sodium (VOLTAREN) 1 % GEL Apply 1 application topically daily as needed (for pain). Apply as directed      dicyclomine (BENTYL) 10 MG capsule Take 10 mg by mouth 3 (three) times daily as needed for spasms.     diphenhydrAMINE (BENADRYL) 25 MG tablet Take 25 mg by mouth every 6 (six) hours as needed for allergies.     diphenoxylate-atropine (LOMOTIL) 2.5-0.025 MG tablet Take 2 tablets by mouth 4 (four) times daily as needed for diarrhea or loose stools.     doxycycline (VIBRA-TABS) 100 MG tablet Take 100 mg by mouth 2 (two) times daily.     DULoxetine (CYMBALTA) 60 MG capsule Take 120 mg by mouth daily.      EPINEPHrine (EPIPEN 2-PAK) 0.3 mg/0.3 mL IJ SOAJ injection See admin instructions.     famotidine (PEPCID) 20 MG tablet Take 20 mg by mouth at bedtime.     fexofenadine (ALLEGRA) 180 MG tablet Take 180 mg by mouth daily.     fluticasone (FLONASE) 50 MCG/ACT nasal spray Place 1 spray into both nostrils daily.     Nutritional Supplements (JUICE PLUS FIBRE PO) Take 2 each by mouth daily. Fruits and Vegetables     Omega-3 Fatty Acids (FISH OIL) 1000 MG CAPS Take 1,000 mg by mouth daily.      ondansetron (ZOFRAN) 4 MG tablet Take 4 mg by mouth every 8 (eight) hours as needed for nausea or vomiting.     oxyCODONE (OXY IR/ROXICODONE) 5 MG immediate release tablet Take 1 tablet (5 mg total) by mouth every 6 (six) hours as needed for severe pain. 15 tablet 0   pantoprazole (PROTONIX) 40 MG tablet Take 40 mg by mouth daily.      Phenylephrine-APAP-Guaifenesin (TYLENOL SINUS SEVERE PO) Take 2 tablets by mouth daily as needed (for congestion).     polyvinyl alcohol (LIQUIFILM TEARS) 1.4 % ophthalmic solution Place 1 drop into both eyes as needed for dry eyes.     Probiotic  Product (PROBIOTIC & ACIDOPHILUS EX ST PO) Take 1 capsule by mouth daily. Ultra Flora Plus Capsules     promethazine-dextromethorphan (PROMETHAZINE-DM) 6.25-15 MG/5ML syrup Take 5 mLs by mouth every 6 (six) hours as needed.     Propylene Glycol (SYSTANE BALANCE) 0.6 % SOLN Place 1 drop into both eyes daily.     rOPINIRole (REQUIP) 0.5 MG  tablet Take 0.5 mg by mouth at bedtime.     simvastatin (ZOCOR) 10 MG tablet Take 10 mg by mouth at bedtime.       sucralfate (CARAFATE) 1 GM/10ML suspension Take 1 g by mouth 4 (four) times daily -  with meals and at bedtime.     traZODone (DESYREL) 50 MG tablet Take 50 mg by mouth at bedtime.     vitamin E 400 UNIT capsule Take 400 Units by mouth daily.       No current facility-administered medications for this visit.   REVIEW OF SYSTEMS:   Review of Systems  Constitutional: Negative.  Negative for appetite change, chills, diaphoresis, fatigue, fever and unexpected weight change.  HENT:  Negative.  Negative for hearing loss, lump/mass, mouth sores, nosebleeds, sore throat, tinnitus, trouble swallowing and voice change.   Eyes: Negative.  Negative for eye problems and icterus.  Respiratory: Negative.  Negative for chest tightness, cough, hemoptysis, shortness of breath and wheezing.   Cardiovascular: Negative.  Negative for chest pain, leg swelling and palpitations.  Gastrointestinal: Negative.  Negative for abdominal distention, abdominal pain, blood in stool, constipation, diarrhea, nausea, rectal pain and vomiting.  Endocrine: Negative.   Genitourinary: Negative.  Negative for bladder incontinence, difficulty urinating, dyspareunia, dysuria, frequency, hematuria, menstrual problem, nocturia, pelvic pain, vaginal bleeding and vaginal discharge.   Musculoskeletal:  Positive for arthralgias (left foot). Negative for back pain, flank pain, gait problem, myalgias, neck pain and neck stiffness.  Skin: Negative.  Negative for itching, rash and wound.  Neurological:  Negative for dizziness, extremity weakness, gait problem, headaches, light-headedness, numbness, seizures and speech difficulty.  Hematological: Negative.  Negative for adenopathy. Does not bruise/bleed easily.  Psychiatric/Behavioral: Negative.  Negative for confusion, decreased concentration, depression, sleep disturbance and  suicidal ideas. The patient is not nervous/anxious.    PHYSICAL EXAMINATION: ECOG PERFORMANCE STATUS: 0 - Asymptomatic Physical Exam Vitals and nursing note reviewed.  Constitutional:      General: She is not in acute distress.    Appearance: Normal appearance. She is normal weight. She is not ill-appearing, toxic-appearing or diaphoretic.  HENT:     Head: Normocephalic and atraumatic.     Right Ear: Tympanic membrane, ear canal and external ear normal. There is no impacted cerumen.     Left Ear: Tympanic membrane, ear canal and external ear normal. There is no impacted cerumen.     Nose: Nose normal. No congestion or rhinorrhea.     Mouth/Throat:     Mouth: Mucous membranes are moist.     Pharynx: Oropharynx is clear. No oropharyngeal exudate or posterior oropharyngeal erythema.  Eyes:     General: No scleral icterus.       Right eye: No discharge.        Left eye: No discharge.     Extraocular Movements: Extraocular movements intact.     Conjunctiva/sclera: Conjunctivae normal.     Pupils: Pupils are equal, round, and reactive to light.  Neck:     Vascular: No carotid bruit.  Cardiovascular:     Rate and Rhythm: Normal rate and regular rhythm.  Pulses: Normal pulses.     Heart sounds: Normal heart sounds. No murmur heard.    No friction rub. No gallop.  Pulmonary:     Effort: Pulmonary effort is normal. No respiratory distress.     Breath sounds: Normal breath sounds. No stridor. No wheezing, rhonchi or rales.  Chest:     Chest wall: No tenderness.     Comments: Healing scar in the upper outer quadrant of the right breast around the areolar complex. Three is some nodularity superior to that. The right axillary scar is well healed.  No masses in either breast Small scaly keratosis to the medial aspect of the right nipple.  Abdominal:     General: Bowel sounds are normal. There is no distension.     Palpations: Abdomen is soft. There is no hepatomegaly, splenomegaly or  mass.     Tenderness: There is no abdominal tenderness. There is no right CVA tenderness, left CVA tenderness, guarding or rebound.     Hernia: No hernia is present.  Musculoskeletal:        General: No swelling, deformity or signs of injury.     Right shoulder: No tenderness. Normal range of motion.     Cervical back: Normal range of motion and neck supple. No rigidity or tenderness.     Right lower leg: No edema.     Left lower leg: No edema.  Lymphadenopathy:     Cervical: No cervical adenopathy.     Right cervical: No superficial, deep or posterior cervical adenopathy.    Left cervical: No superficial, deep or posterior cervical adenopathy.     Upper Body:     Right upper body: No supraclavicular, axillary or pectoral adenopathy.     Left upper body: No supraclavicular, axillary or pectoral adenopathy.  Skin:    General: Skin is warm and dry.     Coloration: Skin is not jaundiced or pale.     Findings: No bruising, erythema, lesion or rash.  Neurological:     General: No focal deficit present.     Mental Status: She is alert and oriented to person, place, and time. Mental status is at baseline.     Cranial Nerves: No cranial nerve deficit.     Sensory: No sensory deficit.     Motor: No weakness.     Coordination: Coordination normal.     Gait: Gait normal.     Deep Tendon Reflexes: Reflexes normal.  Psychiatric:        Mood and Affect: Mood normal.        Behavior: Behavior normal.        Thought Content: Thought content normal.        Judgment: Judgment normal.    LABORATORY DATA:  I have reviewed the data as listed Lab Results  Component Value Date   WBC 4.7 10/28/2022   HGB 12.8 10/28/2022   HCT 39.1 10/28/2022   MCV 88.5 10/28/2022   PLT 194 10/28/2022   Lab Results  Component Value Date   NA 135 10/28/2022   K 4.0 10/28/2022   CL 102 10/28/2022   CO2 25 10/28/2022   Component Ref Range & Units 9 mo ago (07/28/21) 9 mo ago (06/30/21)  Sodium 135 - 145  mmol/L 140 140 R  Potassium 3.5 - 5.1 mmol/L 4.3 4.2 R  Chloride 98 - 111 mmol/L 105 105 R  CO2 22 - 32 mmol/L 27 25 Abnormal  R  Glucose, Bld 70 - 99 mg/dL 782  High  109 R  BUN 8 - 23 mg/dL 15 16 R  Creatinine, Ser 0.44 - 1.00 mg/dL 1.30 0.7 R  Calcium 8.9 - 10.3 mg/dL 9.7     RADIOGRAPHIC STUDIES: No results found.      I,Jasmine M Lassiter,acting as a scribe for Doris Beckwith, MD.,have documented all relevant documentation on the behalf of Doris Beckwith, MD,as directed by  Doris Beckwith, MD while in the presence of Doris Beckwith, MD.

## 2022-10-28 ENCOUNTER — Inpatient Hospital Stay: Payer: Medicare Other

## 2022-10-28 ENCOUNTER — Encounter: Payer: Self-pay | Admitting: Oncology

## 2022-10-28 ENCOUNTER — Other Ambulatory Visit: Payer: Self-pay

## 2022-10-28 ENCOUNTER — Inpatient Hospital Stay: Payer: Medicare Other | Attending: Oncology | Admitting: Oncology

## 2022-10-28 VITALS — BP 129/69 | HR 56 | Temp 97.9°F | Resp 16 | Ht 63.0 in | Wt 213.4 lb

## 2022-10-28 DIAGNOSIS — R87629 Unspecified abnormal cytological findings in specimens from vagina: Secondary | ICD-10-CM | POA: Insufficient documentation

## 2022-10-28 DIAGNOSIS — Z90710 Acquired absence of both cervix and uterus: Secondary | ICD-10-CM | POA: Diagnosis not present

## 2022-10-28 DIAGNOSIS — Z803 Family history of malignant neoplasm of breast: Secondary | ICD-10-CM | POA: Insufficient documentation

## 2022-10-28 DIAGNOSIS — N939 Abnormal uterine and vaginal bleeding, unspecified: Secondary | ICD-10-CM | POA: Insufficient documentation

## 2022-10-28 DIAGNOSIS — K589 Irritable bowel syndrome without diarrhea: Secondary | ICD-10-CM | POA: Insufficient documentation

## 2022-10-28 DIAGNOSIS — E785 Hyperlipidemia, unspecified: Secondary | ICD-10-CM | POA: Diagnosis not present

## 2022-10-28 DIAGNOSIS — C50111 Malignant neoplasm of central portion of right female breast: Secondary | ICD-10-CM

## 2022-10-28 DIAGNOSIS — Z7982 Long term (current) use of aspirin: Secondary | ICD-10-CM | POA: Insufficient documentation

## 2022-10-28 DIAGNOSIS — Z148 Genetic carrier of other disease: Secondary | ICD-10-CM | POA: Insufficient documentation

## 2022-10-28 DIAGNOSIS — M810 Age-related osteoporosis without current pathological fracture: Secondary | ICD-10-CM | POA: Diagnosis not present

## 2022-10-28 DIAGNOSIS — K219 Gastro-esophageal reflux disease without esophagitis: Secondary | ICD-10-CM | POA: Insufficient documentation

## 2022-10-28 DIAGNOSIS — E559 Vitamin D deficiency, unspecified: Secondary | ICD-10-CM | POA: Diagnosis not present

## 2022-10-28 DIAGNOSIS — Z87411 Personal history of vaginal dysplasia: Secondary | ICD-10-CM | POA: Diagnosis not present

## 2022-10-28 DIAGNOSIS — M797 Fibromyalgia: Secondary | ICD-10-CM | POA: Diagnosis not present

## 2022-10-28 DIAGNOSIS — F32A Depression, unspecified: Secondary | ICD-10-CM | POA: Diagnosis not present

## 2022-10-28 DIAGNOSIS — G4730 Sleep apnea, unspecified: Secondary | ICD-10-CM | POA: Diagnosis not present

## 2022-10-28 DIAGNOSIS — Z170 Estrogen receptor positive status [ER+]: Secondary | ICD-10-CM | POA: Insufficient documentation

## 2022-10-28 DIAGNOSIS — Z780 Asymptomatic menopausal state: Secondary | ICD-10-CM | POA: Insufficient documentation

## 2022-10-28 DIAGNOSIS — Z8719 Personal history of other diseases of the digestive system: Secondary | ICD-10-CM | POA: Insufficient documentation

## 2022-10-28 DIAGNOSIS — G2581 Restless legs syndrome: Secondary | ICD-10-CM | POA: Insufficient documentation

## 2022-10-28 DIAGNOSIS — Z8042 Family history of malignant neoplasm of prostate: Secondary | ICD-10-CM | POA: Insufficient documentation

## 2022-10-28 DIAGNOSIS — Z1501 Genetic susceptibility to malignant neoplasm of breast: Secondary | ICD-10-CM | POA: Insufficient documentation

## 2022-10-28 DIAGNOSIS — I10 Essential (primary) hypertension: Secondary | ICD-10-CM | POA: Diagnosis not present

## 2022-10-28 DIAGNOSIS — Z1509 Genetic susceptibility to other malignant neoplasm: Secondary | ICD-10-CM | POA: Insufficient documentation

## 2022-10-28 DIAGNOSIS — Z9223 Personal history of estrogen therapy: Secondary | ICD-10-CM | POA: Insufficient documentation

## 2022-10-28 DIAGNOSIS — F419 Anxiety disorder, unspecified: Secondary | ICD-10-CM | POA: Diagnosis not present

## 2022-10-28 DIAGNOSIS — Z1151 Encounter for screening for human papillomavirus (HPV): Secondary | ICD-10-CM | POA: Insufficient documentation

## 2022-10-28 DIAGNOSIS — Z8041 Family history of malignant neoplasm of ovary: Secondary | ICD-10-CM | POA: Insufficient documentation

## 2022-10-28 DIAGNOSIS — Z800 Family history of malignant neoplasm of digestive organs: Secondary | ICD-10-CM | POA: Insufficient documentation

## 2022-10-28 DIAGNOSIS — Z90722 Acquired absence of ovaries, bilateral: Secondary | ICD-10-CM | POA: Diagnosis not present

## 2022-10-28 DIAGNOSIS — Z1502 Genetic susceptibility to malignant neoplasm of ovary: Secondary | ICD-10-CM | POA: Diagnosis not present

## 2022-10-28 DIAGNOSIS — Z79899 Other long term (current) drug therapy: Secondary | ICD-10-CM | POA: Insufficient documentation

## 2022-10-28 LAB — CMP (CANCER CENTER ONLY)
ALT: 32 U/L (ref 0–44)
AST: 22 U/L (ref 15–41)
Albumin: 4 g/dL (ref 3.5–5.0)
Alkaline Phosphatase: 51 U/L (ref 38–126)
Anion gap: 8 (ref 5–15)
BUN: 19 mg/dL (ref 8–23)
CO2: 25 mmol/L (ref 22–32)
Calcium: 9.1 mg/dL (ref 8.9–10.3)
Chloride: 102 mmol/L (ref 98–111)
Creatinine: 0.94 mg/dL (ref 0.44–1.00)
GFR, Estimated: >60 mL/min (ref >60)
Glucose, Bld: 107 mg/dL — ABNORMAL HIGH (ref 70–99)
Potassium: 4 mmol/L (ref 3.5–5.1)
Sodium: 135 mmol/L (ref 135–145)
Total Bilirubin: 0.7 mg/dL (ref 0.3–1.2)
Total Protein: 6.9 g/dL (ref 6.5–8.1)

## 2022-10-28 LAB — CBC WITH DIFFERENTIAL (CANCER CENTER ONLY)
Abs Immature Granulocytes: 0.02 10*3/uL (ref 0.00–0.07)
Basophils Absolute: 0 10*3/uL (ref 0.0–0.1)
Basophils Relative: 1 %
Eosinophils Absolute: 0.2 10*3/uL (ref 0.0–0.5)
Eosinophils Relative: 4 %
HCT: 39.1 % (ref 36.0–46.0)
Hemoglobin: 12.8 g/dL (ref 12.0–15.0)
Immature Granulocytes: 0 %
Lymphocytes Relative: 26 %
Lymphs Abs: 1.3 10*3/uL (ref 0.7–4.0)
MCH: 29 pg (ref 26.0–34.0)
MCHC: 32.7 g/dL (ref 30.0–36.0)
MCV: 88.5 fL (ref 80.0–100.0)
Monocytes Absolute: 0.6 10*3/uL (ref 0.1–1.0)
Monocytes Relative: 13 %
Neutro Abs: 2.6 10*3/uL (ref 1.7–7.7)
Neutrophils Relative %: 56 %
Platelet Count: 194 10*3/uL (ref 150–400)
RBC: 4.42 MIL/uL (ref 3.87–5.11)
RDW: 13 % (ref 11.5–15.5)
WBC Count: 4.7 10*3/uL (ref 4.0–10.5)
nRBC: 0 % (ref 0.0–0.2)

## 2022-10-31 ENCOUNTER — Telehealth: Payer: Self-pay | Admitting: Oncology

## 2022-10-31 NOTE — Telephone Encounter (Signed)
Contacted pt to schedule an appt. Unable to reach via phone, voicemail was left.   Scheduling Message Entered by Gery Pray H on 10/28/2022 at 11:29 AM Priority: Routine <No visit type provided>  Department: [facility]  Provider:  Scheduling Notes:  RT 3 months (2nd week in Nov)

## 2022-11-02 ENCOUNTER — Telehealth: Payer: Self-pay | Admitting: Oncology

## 2022-11-02 NOTE — Telephone Encounter (Signed)
Patient has been scheduled. Aware of appt date and time    Scheduling Message Entered by Gery Pray H on 10/28/2022 at 11:29 AM Priority: Routine <No visit type provided>  Department: [facility]  Provider:  Scheduling Notes:  RT 3 months (2nd week in Nov)

## 2022-11-03 ENCOUNTER — Telehealth: Payer: Self-pay

## 2022-11-03 NOTE — Telephone Encounter (Signed)
Spoke with the patient regarding the referral to GYN oncology. Patient scheduled as new patient with Dr Pricilla Holm on 11/24/2022. Patient given an arrival time of 9:15am.  Explained to the patient the the doctor will perform a pelvic exam at this visit. Patient given the policy that only one visitor allowed and that visitor must be over 16 yrs are allowed in the Cancer Center. Patient given the address/phone number for the clinic and that the center offers free valet service. Patient aware that masks are option.

## 2022-11-11 ENCOUNTER — Telehealth: Payer: Self-pay

## 2022-11-11 NOTE — Telephone Encounter (Signed)
Attempted to contact patient. No answer. 

## 2022-11-11 NOTE — Telephone Encounter (Signed)
-----   Message from Dellia Beckwith sent at 11/10/2022  8:24 PM EDT ----- Regarding: call Tell her labs are all normal

## 2022-11-14 ENCOUNTER — Telehealth: Payer: Self-pay

## 2022-11-14 NOTE — Telephone Encounter (Signed)
-----   Message from Dellia Beckwith sent at 11/10/2022  8:24 PM EDT ----- Regarding: call Tell her labs are all normal

## 2022-11-14 NOTE — Telephone Encounter (Signed)
Attempted to contact patient. No answer. 

## 2022-11-17 ENCOUNTER — Telehealth: Payer: Self-pay

## 2022-11-17 ENCOUNTER — Encounter: Payer: Self-pay | Admitting: Gynecologic Oncology

## 2022-11-17 NOTE — Telephone Encounter (Signed)
-----   Message from Dellia Beckwith sent at 11/10/2022  8:24 PM EDT ----- Regarding: call Tell her labs are all normal

## 2022-11-17 NOTE — Telephone Encounter (Signed)
Attempted to contact patient. No answer. 

## 2022-11-22 NOTE — Progress Notes (Signed)
GYNECOLOGIC ONCOLOGY NEW PATIENT CONSULTATION   Patient Name: Doris Reyes  Patient Age: 72 y.o. Date of Service: 11/24/22 Referring Provider: Marcelle Overlie, MD  Primary Care Provider: Noberto Retort, MD Consulting Provider: Eugene Garnet, MD   Assessment/Plan:  Postmenopausal patient with discrepancy between Pap and vaginal biopsy.  We reviewed her prior history of Pap test as well as more recent history of cervical dysplasia.  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  I suggested we perform high risk HPV testing as well as subtyping, which was collected today.  Based on her exam, there is an area at the vaginal apex that is concerning for at least low-grade dysplasia.  Repeat biopsy was taken of this area.  If biopsy confirms high-grade dysplasia, we briefly discussed plan for treatment to include either excision or additional biopsies with laser ablation in the operating room.  If biopsy again shows low-grade dysplasia, I suggested a discussion with her medical oncologist about starting some vaginal estrogen as there is literature to support therapeutic benefit in both low-grade vaginal dysplasia (as well as high-grade vaginal dysplasia in combination with other treatment modalities).  Given her history of estrogen receptor breast cancer, I would reach out to Dr. Gilman Buttner prior to initiating vaginal estrogen treatment.  I will call the patient with her biopsy results.  If she requires treatment for high-grade dysplasia, we will get this scheduled in the next several weeks in the outpatient setting.  We discussed causes for her vaginal spotting, which may be related to her vaginal dysplasia, especially if biopsy from today confirms high-grade dysplasia.  We also discussed that vaginal bleeding can frequently happen in the setting of vaginal atrophy after menopause.  Vaginal estrogen would also be helpful for treatment of  her vaginal bleeding if related to atrophy.  A copy of this note was sent to the patient's referring provider.   65 minutes of total time was spent for this patient encounter, including preparation, face-to-face counseling with the patient and coordination of care, and documentation of the encounter.  Eugene Garnet, MD  Division of Gynecologic Oncology  Department of Obstetrics and Gynecology  [facility]  ___________________________________________  Chief Complaint: Chief Complaint  Patient presents with   Adnexal mass    History of Present Illness:  Doris Reyes is a 72 y.o. y.o. female who is seen in consultation at the request of Dr. 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  The patient endorses having occasional abnormal Pap smears prior to her hysterectomy in 2011 for fibroids.  She denies ever having cervical biopsies or procedures done, thinks that Pap smears were just repeated.  After her hysterectomy, she had a period of time where she did not get follow-up.  Since establishing with her OB/GYN provider, she has been getting Pap smears which were initially normal.  Her more recent Pap history is noted below.  09/21/20: ASCUS pap 09/27/2021: ASC-H pap 11/03/21: Vaginoscopy. 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 Biopsy shows squamous mucosa with mild koilocytic atypia c/w LSIL lesion.  She notes occasional spotting or discharge which she thinks happens about once a month.  This is sometimes enough to need a pad.  Also notes intermittently having a vaginal odor.  She is unsure how long this has been  happening.  She endorses having a lot of vaginal dryness since her hysterectomy.  Her bowel function at baseline is abnormal related to her IBS, no recent changes.  She notes weight has  been up and down based on diet.  She denies any urinary symptoms.  She has a history of estrogen receptor positive breast cancer.  She sees Dr. Gilman Buttner.  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  PAST MEDICAL HISTORY:  Past Medical History:  Diagnosis Date   Anxiety    Cancer (HCC)    right breast ILC   Complication of anesthesia    difficulty waking up   Depression    Diverticulitis    Family history of breast cancer    Family history of ovarian cancer    Family history of prostate cancer    Fibromyalgia    GERD (gastroesophageal reflux disease)    History of colonic polyps    Hyperlipidemia    Hypertension    IBS (irritable bowel syndrome)    Ischemic colitis (HCC) 03/2012   Osteoporosis    RLS (restless legs syndrome)    Sinusitis    Sleep apnea    CPAP nightly     PAST SURGICAL HISTORY:  Past Surgical History:  Procedure Laterality Date   BREAST LUMPECTOMY WITH RADIOACTIVE SEED LOCALIZATION Right 08/04/2021   Procedure: RIGHT BREAST LUMPECTOMY WITH RADIOACTIVE SEED LOCALIZATION;  Surgeon: Harriette Bouillon, MD;  Location: [facility];  Service: General;  Laterality: Right;   CHOLECYSTECTOMY  2004   COLONOSCOPY WITH PROPOFOL N/A 04/18/2017   Procedure: COLONOSCOPY WITH PROPOFOL;  Surgeon: Charna Elizabeth, MD;  Location: [facility];  Service: Endoscopy;  Laterality: N/A;   ESOPHAGOGASTRODUODENOSCOPY (EGD) WITH PROPOFOL N/A 04/18/2017   Procedure: ESOPHAGOGASTRODUODENOSCOPY (EGD) WITH PROPOFOL;  Surgeon: Charna Elizabeth, MD;  Location: [facility];  Service: Endoscopy;  Laterality: N/A;   FLEXIBLE SIGMOIDOSCOPY  04/20/2012   Procedure: FLEXIBLE SIGMOIDOSCOPY;  Surgeon: Theda Belfast, MD;  Location: [facility];  Service: Endoscopy;  Laterality: N/A;   NISSEN FUNDOPLICATION  2011   PARAESOPHAGEAL HERNIA REPAIR  09/11/2009   and Nissen fundoplication   RE-EXCISION OF BREAST LUMPECTOMY Right 08/18/2021   Procedure: RE-EXCISION RIGHT BREAST LUMPECTOMY;   Surgeon: Harriette Bouillon, MD;  Location: [facility];  Service: General;  Laterality: Right;   SENTINEL NODE BIOPSY N/A 08/04/2021   Procedure: SENTINEL NODE BIOPSY;  Surgeon: Harriette Bouillon, MD;  Location: [facility];  Service: General;  Laterality: N/A;   TONSILLECTOMY  1960's   TOTAL ABDOMINAL HYSTERECTOMY  2001   w/ BSO    OB/GYN HISTORY:  OB History  Gravida Para Term Preterm AB Living  1 1       1   SAB IAB Ectopic Multiple Live Births               # Outcome Date GA Lbr Len/2nd Weight Sex Type Anes PTL Lv  1 Para             No LMP recorded. Patient has had a hysterectomy.  Age at menarche: 86  Age at menopause: 58 Hx of HRT: denies Hx of STDs: denies Last pap: see HPI History of abnormal pap smears: see HPI  SCREENING STUDIES:  Last mammogram: 2024  Last colonoscopy: 2019 Last bone mineral density: 2023  MEDICATIONS: Outpatient Encounter Medications as of 11/24/2022  Medication Sig   acetaminophen (TYLENOL) 650 MG CR tablet 1,300 mg in the morning and at bedtime.   ALPRAZolam (XANAX) 0.25 MG  tablet Take 0.25 mg by mouth at bedtime as needed for anxiety.   amLODipine (NORVASC) 5 MG tablet Take 5 mg by mouth daily.   Ascorbic Acid (VITAMIN C) 500 MG CHEW Chew 500 mg by mouth daily.   aspirin EC 81 MG tablet Take 81 mg by mouth daily. Swallow whole.   atenolol (TENORMIN) 50 MG tablet Take 75 mg by mouth daily.    benazepril (LOTENSIN) 10 MG tablet Take 10 mg by mouth daily.   benzonatate (TESSALON) 100 MG capsule Take 100-200 mg by mouth at bedtime.   Biotin 1000 MCG tablet Take 1,000 mcg by mouth daily.   Calcium Citrate-Vitamin D (CITRACAL + D PO) Take 1 tablet by mouth daily.   cetirizine (ZYRTEC) 10 MG tablet Take 10 mg by mouth daily as needed for allergies.    Cholecalciferol (VITAMIN D) 1000 UNITS capsule Take 2,000 Units by mouth daily.    cyanocobalamin 1000 MCG tablet Take 1,000 mcg by mouth daily.   cyclobenzaprine (FLEXERIL) 5 MG tablet Take  5 mg by mouth 3 (three) times daily as needed for muscle spasms.   diclofenac sodium (VOLTAREN) 1 % GEL Apply 1 application topically daily as needed (for pain). Apply as directed   dicyclomine (BENTYL) 10 MG capsule Take 10 mg by mouth 3 (three) times daily as needed for spasms.   diphenhydrAMINE (BENADRYL) 25 MG tablet Take 25 mg by mouth every 6 (six) hours as needed for allergies.   diphenoxylate-atropine (LOMOTIL) 2.5-0.025 MG tablet Take 2 tablets by mouth 4 (four) times daily as needed for diarrhea or loose stools.   doxycycline (VIBRA-TABS) 100 MG tablet Take 100 mg by mouth 2 (two) times daily.   DULoxetine (CYMBALTA) 60 MG capsule Take 120 mg by mouth daily.    EPINEPHrine (EPIPEN 2-PAK) 0.3 mg/0.3 mL IJ SOAJ injection See admin instructions.   famotidine (PEPCID) 20 MG tablet Take 20 mg by mouth at bedtime.   fexofenadine (ALLEGRA) 180 MG tablet Take 180 mg by mouth daily.   fluticasone (FLONASE) 50 MCG/ACT nasal spray Place 1 spray into both nostrils daily.   Nutritional Supplements (JUICE PLUS FIBRE PO) Take 2 each by mouth daily. Fruits and Vegetables   Omega-3 Fatty Acids (FISH OIL) 1000 MG CAPS Take 1,000 mg by mouth daily.    ondansetron (ZOFRAN) 4 MG tablet Take 4 mg by mouth every 8 (eight) hours as needed for nausea or vomiting.   oxyCODONE (OXY IR/ROXICODONE) 5 MG immediate release tablet Take 1 tablet (5 mg total) by mouth every 6 (six) hours as needed for severe pain.   pantoprazole (PROTONIX) 40 MG tablet Take 40 mg by mouth daily.    Phenylephrine-APAP-Guaifenesin (TYLENOL SINUS SEVERE PO) Take 2 tablets by mouth daily as needed (for congestion).   polyvinyl alcohol (LIQUIFILM TEARS) 1.4 % ophthalmic solution Place 1 drop into both eyes as needed for dry eyes.   Probiotic Product (PROBIOTIC & ACIDOPHILUS EX ST PO) Take 1 capsule by mouth daily. Ultra Flora Plus Capsules   promethazine-dextromethorphan (PROMETHAZINE-DM) 6.25-15 MG/5ML syrup Take 5 mLs by mouth every 6 (six)  hours as needed.   Propylene Glycol (SYSTANE BALANCE) 0.6 % SOLN Place 1 drop into both eyes daily.   rOPINIRole (REQUIP) 0.5 MG tablet Take 0.5 mg by mouth at bedtime.   simvastatin (ZOCOR) 10 MG tablet Take 10 mg by mouth at bedtime.     sucralfate (CARAFATE) 1 GM/10ML suspension Take 1 g by mouth 4 (four) times daily -  with meals and  at bedtime.   traZODone (DESYREL) 50 MG tablet Take 50 mg by mouth at bedtime.   vitamin E 400 UNIT capsule Take 400 Units by mouth daily.     No facility-administered encounter medications on file as of 11/24/2022.    ALLERGIES:  Allergies  Allergen Reactions   Ceftin [Cefuroxime Axetil] Diarrhea   Cefuroxime Other (See Comments)    Other reaction(s): IBS  Other reaction(s): Unknown   Pneumococcal Vac Polyvalent     Other reaction(s): Unknown   Pneumococcal Vaccine Other (See Comments)   Tramadol Other (See Comments)   Tramadol Hcl Nausea And Vomiting    Other reaction(s): vomiting     FAMILY HISTORY:  Family History  Problem Relation Age of Onset   Colon cancer Mother 69   Alzheimer's disease Mother    Heart attack Father    Cancer Maternal Aunt        x2   Alzheimer's disease Maternal Aunt    Ovarian cancer Maternal Aunt    Prostate cancer Maternal Uncle    Kidney disease Maternal Uncle    Atrial fibrillation Maternal Uncle    Stroke Paternal Uncle    Aneurysm Paternal Grandmother        brain   Stroke Paternal Grandfather    Prostate cancer Cousin        paternal first cousin   Prostate cancer Cousin        paternal first cousin   Esophageal cancer Cousin        maternal first cousin   Breast cancer Cousin        DCIS, maternal first cousin   Ovarian cancer Cousin        maternal first cousin   Rheum arthritis Other    Lung disease Neg Hx      SOCIAL HISTORY:  Social Connections: Not on file    REVIEW OF SYSTEMS:  Denies appetite changes, fevers, chills, fatigue, unexplained weight changes. Denies hearing loss, neck  lumps or masses, mouth sores, ringing in ears or voice changes. Denies cough or wheezing.  Denies shortness of breath. Denies chest pain or palpitations. Denies leg swelling. Denies abdominal distention, pain, blood in stools, constipation, diarrhea, nausea, vomiting, or early satiety. Denies pain with intercourse, dysuria, frequency, hematuria or incontinence. Denies hot flashes, pelvic pain, vaginal bleeding or vaginal discharge.   Denies joint pain, back pain or muscle pain/cramps. Denies itching, rash, or wounds. Denies dizziness, headaches, numbness or seizures. Denies swollen lymph nodes or glands, denies easy bruising or bleeding. Denies anxiety, depression, confusion, or decreased concentration.  Physical Exam:  Vital Signs for this encounter:  Blood pressure 114/83, pulse (!) 56, temperature (!) 97.1 F (36.2 C), resp. rate 18, weight 215 lb 9.6 oz (97.8 kg), SpO2 96%. Body mass index is 38.19 kg/m. General: Alert, oriented, no acute distress.  HEENT: Normocephalic, atraumatic. Sclera anicteric.  Chest: Clear to auscultation bilaterally. No wheezes, rhonchi, or rales. Cardiovascular: Regular rate and rhythm, no murmurs, rubs, or gallops.  Abdomen: Obese. Normoactive bowel sounds. Soft, nondistended, nontender to palpation. No masses or hepatosplenomegaly appreciated. No palpable fluid wave.  Extremities: Grossly normal range of motion. Warm, well perfused. No edema bilaterally.  Skin: No rashes or lesions.  Lymphatics: No cervical, supraclavicular, or inguinal adenopathy.  GU:  Normal external female genitalia. No lesions. No discharge or bleeding.             Bladder/urethra:  No lesions or masses, well supported bladder  Vagina: HPV collected. Mild-moderately atrophic, see colposcopic findings below.  On bimanual exam, no nodularity or masses appreciated.             Cervix/uterus: surgically absent.             Adnexa: No masses appreciated.  Rectal:  Deferred.  Colposcopy and vaginal biopsy Preoperative diagnosis: Abnormal Pap, discrepancy between Pap and recent biopsy Postoperative diagnosis: Same as above Physician: Pricilla Holm MD Estimated blood loss: Minimal Specimens: Vaginal cuff biopsy Procedure: After the procedure was discussed with the patient including risks and benefits, she gave verbal consent.  She was then placed in dorsolithotomy position and a speculum was placed in the vagina.  Once the vaginal apex was well visualized, HPV test was collected.  Central area of the apex was somewhat friable.  5% acetic acid was applied to the entire vaginal cuff and upper vagina.  Colposcopy was then performed with findings of acetowhite along the midportion of the cuff with some punctations, and the area that was previously noted to be friable.  No other areas of acetowhite were noted.  This area was cleansed with Betadine x 3.  Tischler forceps were then used to take a biopsy of the mid vaginal cuff.  This was placed in formalin.  Hemostasis was noted.  Lugol's was then applied to the mid and lower vagina with no areas of decreased uptake noted.  Overall the patient tolerated the procedure well.  All instruments were removed from the vagina.  LABORATORY AND RADIOLOGIC DATA:  Outside medical records were reviewed to synthesize the above history, along with the history and physical obtained during the visit.   Lab Results  Component Value Date   WBC 4.7 10/28/2022   HGB 12.8 10/28/2022   HCT 39.1 10/28/2022   PLT 194 10/28/2022   GLUCOSE 107 (H) 10/28/2022   ALT 32 10/28/2022   AST 22 10/28/2022   NA 135 10/28/2022   K 4.0 10/28/2022   CL 102 10/28/2022   CREATININE 0.94 10/28/2022   BUN 19 10/28/2022   CO2 25 10/28/2022   TSH 0.895 02/25/2020   HGBA1C 6.4 (H) 02/25/2020

## 2022-11-23 ENCOUNTER — Encounter: Payer: Self-pay | Admitting: Gynecologic Oncology

## 2022-11-24 ENCOUNTER — Encounter: Payer: Self-pay | Admitting: Gynecologic Oncology

## 2022-11-24 ENCOUNTER — Inpatient Hospital Stay (HOSPITAL_BASED_OUTPATIENT_CLINIC_OR_DEPARTMENT_OTHER): Payer: Medicare Other | Admitting: Gynecologic Oncology

## 2022-11-24 VITALS — BP 114/83 | HR 56 | Temp 97.1°F | Resp 18 | Wt 215.6 lb

## 2022-11-24 DIAGNOSIS — N939 Abnormal uterine and vaginal bleeding, unspecified: Secondary | ICD-10-CM

## 2022-11-24 DIAGNOSIS — N890 Mild vaginal dysplasia: Secondary | ICD-10-CM | POA: Diagnosis not present

## 2022-11-24 DIAGNOSIS — R87612 Low grade squamous intraepithelial lesion on cytologic smear of cervix (LGSIL): Secondary | ICD-10-CM

## 2022-11-24 DIAGNOSIS — Z8619 Personal history of other infectious and parasitic diseases: Secondary | ICD-10-CM

## 2022-11-24 DIAGNOSIS — G4733 Obstructive sleep apnea (adult) (pediatric): Secondary | ICD-10-CM | POA: Diagnosis not present

## 2022-11-24 DIAGNOSIS — Z148 Genetic carrier of other disease: Secondary | ICD-10-CM | POA: Diagnosis not present

## 2022-11-24 DIAGNOSIS — Z170 Estrogen receptor positive status [ER+]: Secondary | ICD-10-CM | POA: Diagnosis not present

## 2022-11-24 DIAGNOSIS — C50111 Malignant neoplasm of central portion of right female breast: Secondary | ICD-10-CM | POA: Diagnosis not present

## 2022-11-24 DIAGNOSIS — R87629 Unspecified abnormal cytological findings in specimens from vagina: Secondary | ICD-10-CM | POA: Diagnosis not present

## 2022-11-24 DIAGNOSIS — Z87411 Personal history of vaginal dysplasia: Secondary | ICD-10-CM | POA: Diagnosis not present

## 2022-11-24 DIAGNOSIS — Z1151 Encounter for screening for human papillomavirus (HPV): Secondary | ICD-10-CM | POA: Diagnosis not present

## 2022-11-24 NOTE — Patient Instructions (Signed)
It was very nice to meet you.  I will call you when I have results from today including the HPV test as well as your vaginal biopsy.  If the vaginal biopsy shows high-grade precancer, we will discuss a procedure in the operating room to either remove this area or laser/burn it.

## 2022-11-30 ENCOUNTER — Telehealth: Payer: Self-pay | Admitting: Gynecologic Oncology

## 2022-11-30 DIAGNOSIS — N890 Mild vaginal dysplasia: Secondary | ICD-10-CM

## 2022-11-30 MED ORDER — ESTROGENS CONJUGATED 0.625 MG/GM VA CREA
1.0000 | TOPICAL_CREAM | VAGINAL | 1 refills | Status: DC
Start: 2022-11-30 — End: 2022-12-01

## 2022-11-30 NOTE — Telephone Encounter (Signed)
Called patient - discussed biopsy (VAIN1 suggestive of HPV) and HPV test (surprisingly negative). 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 Given low grade dysplasia, will plan on vaginal estrogen treatment. Spoke with her medical oncologist who agrees safe to start. I will see her in 4-6 months for repeat exam.  Eugene Garnet MD Gynecologic Oncology

## 2022-12-01 ENCOUNTER — Other Ambulatory Visit: Payer: Self-pay | Admitting: Gynecologic Oncology

## 2022-12-01 ENCOUNTER — Telehealth: Payer: Self-pay

## 2022-12-01 DIAGNOSIS — N952 Postmenopausal atrophic vaginitis: Secondary | ICD-10-CM

## 2022-12-01 DIAGNOSIS — N890 Mild vaginal dysplasia: Secondary | ICD-10-CM

## 2022-12-01 LAB — CERVICOVAGINAL ANCILLARY ONLY
Comment: NEGATIVE
High risk HPV: NEGATIVE

## 2022-12-01 LAB — SURGICAL PATHOLOGY

## 2022-12-01 MED ORDER — ESTRADIOL 0.1 MG/GM VA CREA
1.0000 | TOPICAL_CREAM | VAGINAL | 12 refills | Status: DC
Start: 2022-12-02 — End: 2023-09-21

## 2022-12-01 NOTE — Telephone Encounter (Signed)
The amount to be used is the same - just recommending that she not use the applicator and instead insert using her finger.

## 2022-12-01 NOTE — Telephone Encounter (Signed)
Pt is scheduled for 05/04/23 @ 1:00. Pt agrees to date and time

## 2022-12-01 NOTE — Telephone Encounter (Signed)
Inetta Fermo called from [facility] for clarification on directions of Premarin vaginal cream. Insurance is wanting to know specifically what "finger tip amount" is in grams per usage at entrance of vagina?  Cal back number for Inetta Fermo is [phone]

## 2022-12-01 NOTE — Telephone Encounter (Signed)
I spoke to pharmacist she states applicator is equal to 2g. She will fill Rx and inform pt when ok to pick up.

## 2022-12-01 NOTE — Telephone Encounter (Signed)
Sent in a script for Estrace. Please have her call if this is also very expensive. Thank you

## 2022-12-01 NOTE — Telephone Encounter (Signed)
The patient called stating she spoke to Dr. Pricilla Holm and medication was sent to pharmacy. The Premarin cream, with her insurance, is $151. Please advise if there is another medication that can be sent in that may be cheaper.

## 2022-12-01 NOTE — Telephone Encounter (Signed)
Pt is aware of Rx being sent to pharmacy. She states pharmacy has already notified her and she can afford it and will pick it up. Pt was thankful for the call

## 2023-01-05 DIAGNOSIS — H35372 Puckering of macula, left eye: Secondary | ICD-10-CM | POA: Diagnosis not present

## 2023-01-13 ENCOUNTER — Encounter: Payer: Self-pay | Admitting: Obstetrics and Gynecology

## 2023-02-09 ENCOUNTER — Other Ambulatory Visit: Payer: Medicare Other

## 2023-02-09 ENCOUNTER — Ambulatory Visit: Payer: Medicare Other | Admitting: Oncology

## 2023-02-21 DIAGNOSIS — F5101 Primary insomnia: Secondary | ICD-10-CM | POA: Diagnosis not present

## 2023-02-21 DIAGNOSIS — G259 Extrapyramidal and movement disorder, unspecified: Secondary | ICD-10-CM | POA: Diagnosis not present

## 2023-02-21 DIAGNOSIS — F419 Anxiety disorder, unspecified: Secondary | ICD-10-CM | POA: Diagnosis not present

## 2023-02-21 DIAGNOSIS — M797 Fibromyalgia: Secondary | ICD-10-CM | POA: Diagnosis not present

## 2023-02-21 DIAGNOSIS — J309 Allergic rhinitis, unspecified: Secondary | ICD-10-CM | POA: Diagnosis not present

## 2023-02-21 DIAGNOSIS — E7800 Pure hypercholesterolemia, unspecified: Secondary | ICD-10-CM | POA: Diagnosis not present

## 2023-02-21 DIAGNOSIS — K219 Gastro-esophageal reflux disease without esophagitis: Secondary | ICD-10-CM | POA: Diagnosis not present

## 2023-02-21 DIAGNOSIS — I10 Essential (primary) hypertension: Secondary | ICD-10-CM | POA: Diagnosis not present

## 2023-02-21 DIAGNOSIS — R053 Chronic cough: Secondary | ICD-10-CM | POA: Diagnosis not present

## 2023-02-21 DIAGNOSIS — R7303 Prediabetes: Secondary | ICD-10-CM | POA: Diagnosis not present

## 2023-02-28 ENCOUNTER — Telehealth: Payer: Self-pay | Admitting: *Deleted

## 2023-02-28 NOTE — Telephone Encounter (Signed)
Per provider moved appt from 2/6 to 2/7, patient aware

## 2023-03-16 ENCOUNTER — Other Ambulatory Visit: Payer: Self-pay | Admitting: Oncology

## 2023-03-16 DIAGNOSIS — C50111 Malignant neoplasm of central portion of right female breast: Secondary | ICD-10-CM

## 2023-03-17 ENCOUNTER — Inpatient Hospital Stay: Payer: Medicare Other

## 2023-03-17 ENCOUNTER — Inpatient Hospital Stay: Payer: Medicare Other | Admitting: Oncology

## 2023-04-06 ENCOUNTER — Other Ambulatory Visit: Payer: Self-pay | Admitting: Oncology

## 2023-04-06 ENCOUNTER — Inpatient Hospital Stay: Payer: Medicare Other | Attending: Oncology

## 2023-04-06 ENCOUNTER — Inpatient Hospital Stay (HOSPITAL_BASED_OUTPATIENT_CLINIC_OR_DEPARTMENT_OTHER): Payer: Medicare Other | Admitting: Oncology

## 2023-04-06 ENCOUNTER — Encounter: Payer: Self-pay | Admitting: Oncology

## 2023-04-06 VITALS — BP 120/70 | HR 58 | Temp 97.5°F | Resp 16 | Ht 63.0 in | Wt 216.5 lb

## 2023-04-06 DIAGNOSIS — Z1501 Genetic susceptibility to malignant neoplasm of breast: Secondary | ICD-10-CM | POA: Diagnosis not present

## 2023-04-06 DIAGNOSIS — Z1509 Genetic susceptibility to other malignant neoplasm: Secondary | ICD-10-CM | POA: Diagnosis not present

## 2023-04-06 DIAGNOSIS — C50111 Malignant neoplasm of central portion of right female breast: Secondary | ICD-10-CM | POA: Insufficient documentation

## 2023-04-06 DIAGNOSIS — Z1502 Genetic susceptibility to malignant neoplasm of ovary: Secondary | ICD-10-CM | POA: Diagnosis not present

## 2023-04-06 DIAGNOSIS — Z170 Estrogen receptor positive status [ER+]: Secondary | ICD-10-CM | POA: Diagnosis not present

## 2023-04-06 DIAGNOSIS — Z1732 Human epidermal growth factor receptor 2 negative status: Secondary | ICD-10-CM | POA: Insufficient documentation

## 2023-04-06 DIAGNOSIS — M810 Age-related osteoporosis without current pathological fracture: Secondary | ICD-10-CM

## 2023-04-06 DIAGNOSIS — Z1589 Genetic susceptibility to other disease: Secondary | ICD-10-CM | POA: Diagnosis not present

## 2023-04-06 DIAGNOSIS — Z1231 Encounter for screening mammogram for malignant neoplasm of breast: Secondary | ICD-10-CM

## 2023-04-06 LAB — CMP (CANCER CENTER ONLY)
ALT: 32 U/L (ref 0–44)
AST: 27 U/L (ref 15–41)
Albumin: 4.5 g/dL (ref 3.5–5.0)
Alkaline Phosphatase: 72 U/L (ref 38–126)
Anion gap: 10 (ref 5–15)
BUN: 15 mg/dL (ref 8–23)
CO2: 28 mmol/L (ref 22–32)
Calcium: 10 mg/dL (ref 8.9–10.3)
Chloride: 102 mmol/L (ref 98–111)
Creatinine: 0.98 mg/dL (ref 0.44–1.00)
GFR, Estimated: >60 mL/min (ref >60)
Glucose, Bld: 108 mg/dL — ABNORMAL HIGH (ref 70–99)
Potassium: 4 mmol/L (ref 3.5–5.1)
Sodium: 140 mmol/L (ref 135–145)
Total Bilirubin: 0.3 mg/dL (ref 0.0–1.2)
Total Protein: 7 g/dL (ref 6.5–8.1)

## 2023-04-06 LAB — CBC WITH DIFFERENTIAL (CANCER CENTER ONLY)
Abs Immature Granulocytes: 0.02 10*3/uL (ref 0.00–0.07)
Basophils Absolute: 0 10*3/uL (ref 0.0–0.1)
Basophils Relative: 1 %
Eosinophils Absolute: 1 10*3/uL — ABNORMAL HIGH (ref 0.0–0.5)
Eosinophils Relative: 16 %
HCT: 40.5 % (ref 36.0–46.0)
Hemoglobin: 13.6 g/dL (ref 12.0–15.0)
Immature Granulocytes: 0 %
Lymphocytes Relative: 22 %
Lymphs Abs: 1.3 10*3/uL (ref 0.7–4.0)
MCH: 28.9 pg (ref 26.0–34.0)
MCHC: 33.6 g/dL (ref 30.0–36.0)
MCV: 86 fL (ref 80.0–100.0)
Monocytes Absolute: 0.7 10*3/uL (ref 0.1–1.0)
Monocytes Relative: 11 %
Neutro Abs: 3.1 10*3/uL (ref 1.7–7.7)
Neutrophils Relative %: 50 %
Platelet Count: 251 10*3/uL (ref 150–400)
RBC: 4.71 MIL/uL (ref 3.87–5.11)
RDW: 13.2 % (ref 11.5–15.5)
WBC Count: 6.1 10*3/uL (ref 4.0–10.5)
nRBC: 0 % (ref 0.0–0.2)
nRBC: 0 /100{WBCs}

## 2023-04-06 NOTE — Progress Notes (Addendum)
 [facility]  [address] [place],  [place]  [address] [phone]  Clinic Day:04/06/23  Referring physician:  Arloa Fallow, MD   Assessment:  Invasive lobular carcinoma This was found on screening mammogram but the MRI reveals focal enhancement surrounding the biopsy clip up to 2.3 cm and there was non-mass enhancement posterior to this known malignancy on MRI.  This area was biopsied in order to guide her ultimate surgery.This is strongly ER/PR positive, HER2 negative and has a low Ki 67 of less than 5%.  She has now had a lumpectomy but had to go back for reexcision of the margins.  The sentinel lymph node was negative but she did have 2 primaries, measuring 2.4 cm and 2.2 cm, for a T2 N0 M0, stage IIA.  Even though she has several favorable characteristics, such as grade 1 histology and a low Ki 67, I still recommended that we pursue Endopredict testing to quantitate her risk for recurrence, and fortunately she has an EpClin score of 2.6, low risk.  This correlates with a 5.1% risk of distant recurrence in the next 10 years. Her benefit from chemotherapy would be 1.0% so we did not pursue. Her risk for a late recurrence is 4.0%. Unfortunately she has refused hormonal therapy.  Strong family history Including multiple females with breast cancer, her mother had colon cancer at age 81, and another maternal aunt had ovarian cancer.  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  She has been counseled on the implications of this and is already getting regular colonoscopy.  However the information will also be passed on to family members to pursue testing.  Osteoporosis Her spine shows a T score of -2.6 for osteoporosis and the right femur has osteopenia.  At the very least she should be taking calcium  and vitamin D. She  will be due for repeat bone density this Spring.   Abnormal Pap smear She will have a follow up with a GYN oncologist.  Plan: She is due for her annual diagnostic mammogram at [facility] in February. Because of the nodularity adjacent to her lumpectomy scar, I will add a ultrasound, especially since I can feel a 1.5cm nodule at 10 o'clock. Her last bone density scan was done on 07/13/2021. She has a WBC of 6.1, hemoglobin of 13.6, and platelet count of 251,000. Her CMP is normal. I will see her back in 4 months with repeat bone density scan. If all is well, we will go to 6 month follow up after that.  I discussed the assessment and treatment plan with the patient.  She was provided an opportunity to ask questions and all were answered.  The patient was advised to call back if she has further questions.   I provided 12 minutes of face-to-face time during this this encounter and > 50% was spent counseling as documented under my assessment and plan.   [name], MD [facility] [address] [place] [address] Dept: [phone] Dept Fax: [phone]   No orders of the defined types were placed in this encounter.   CHIEF COMPLAINTS/PURPOSE OF CONSULTATION:  Breast cancer, stage IIA  HISTORY OF PRESENTING ILLNESS:  Doris Reyes 73 y.o. 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  She does have annual mammograms because of her strong  family history, but was on hormone replacement therapy for 10 years.  She had a screening mammogram at [facility] on May 05, 2021 which revealed architectural distortion which was felt to be indeterminant.  She had a diagnostic mammogram on March 3 which showed mild nipple retraction and a 1 cm area of focal asymmetry in the retroareolar region.  Ultrasound revealed a hypoechoic irregular mass measuring 9 mm  in that area.  On March 14 she had a biopsy performed which revealed a 0.9 cm irregular mass with spiculated margins in the right breast at 12:00, approximately 2 cm from the nipple and anteriorly in the breast.  This was found to be grade 2 invasive mammary carcinoma as well as mammary carcinoma in situ.  The E-cadherin immunohistochemistry stains were negative and so this is consistent with invasive lobular carcinoma.  The estrogen receptors were positive at 95% and progesterone receptors positive at 95% with HER2 by immunohistochemistry positive at 2+ but FISH was negative.  Ki-67 was less than 5%.  The carcinoma in situ was positive for E-cadherin, consistent with ductal carcinoma in situ.  She had an MRI of the breast and was found to have focal enhancement in the right breast surrounding the biopsy clip and some non-mass enhancement posterior to the malignancy.  It was recommended that she have an MRI guided biopsy of the clumped non-mass enhancement area and this revealed invasive lobular carcinoma grade 1 with lobular carcinoma in situ and extensive fibrocystic changes with focal intraluminal microcalcification and intraductal papillomatosis.  Due to her strong family history including multiple females with breast cancer in her mother with colon cancer at age 46, she did have genetic testing with [facility] and was found to have a carrier mutation of MUTYH.  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  It was recommended that she have colonoscopy beginning at age 59 or 10 years prior to the age of her first-degree use relatives age at colorectal cancer diagnosis.  It was recommended this be repeated every 5 years.  This was explained to her.  She has now had a lumpectomy but had to go back for reexcision of the margins.  The sentinel lymph node was negative but she did have 2 primaries,  measuring 2.4 cm and 2.2 cm, for a T2 N0 M0, stage IIA. This was ER/PR positive, HER 2 negative and a Ki-67 was 5%.  Even though she has several favorable characteristics, such as grade 1 histology and a low Ki 67, I still recommended that we pursue Endopredict testing to quantitate her risk for recurrence, and fortunately she has an EpClin score of 2.6, low risk. This correlates with a 5.1% risk of distant recurrence in the next 10 years, with only a 1.0% benefit of chemotherapy. Her risk of late recurrence is 4.0%. I recommended hormonal therapy but she refused.  I reviewed her records extensively and collaborated the history with the patient.  SUMMARY OF ONCOLOGIC HISTORY: Oncology History  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)  06/08/2021 Initial Diagnosis   Breast cancer in female [facility])   07/23/2021 Genetic Testing   Negative hereditary cancer genetic testing: no pathogenic variants detected in [facility] BRCAPlus Panel. Report date is July 23, 2021.  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  The report date is Jul 26, 2021.  The BRCAplus panel offered by [employer] [facility] [employer] and includes sequencing and deletion/duplication analysis for the following 8 genes: ATM, BRCA1, BRCA2, CDH1, CHEK2, PALB2, PTEN, and TP53.  Results of pan-cancer panel pending.   The CancerNext-Expanded gene panel offered by [facility] and includes sequencing and rearrangement analysis for the following 77 genes: AIP, ALK, APC*, ATM*, AXIN2, BAP1, BARD1, BLM, BMPR1A, BRCA1*, BRCA2*, BRIP1*, CDC73, CDH1*, CDK4, CDKN1B, CDKN2A, CHEK2*, CTNNA1, DICER1, FANCC, FH, FLCN, GALNT12, KIF1B, LZTR1, MAX, MEN1, MET, MLH1*, MSH2*, MSH3, MSH6*, MUTYH*, NBN, NF1*, NF2, NTHL1, PALB2*, PHOX2B, PMS2*, POT1, PRKAR1A, PTCH1, PTEN*, RAD51C*, RAD51D*, RB1, RECQL, RET, SDHA, SDHAF2, SDHB, SDHC, SDHD, SMAD4,  SMARCA4, SMARCB1, SMARCE1, STK11, SUFU, TMEM127, TP53*, TSC1, TSC2, VHL and XRCC2 (sequencing and deletion/duplication); EGFR, EGLN1, HOXB13, KIT, MITF, PDGFRA, POLD1, and POLE (sequencing only); EPCAM and GREM1 (deletion/duplication only). DNA and RNA analyses performed for * genes.    08/23/2021 Cancer Staging   Staging form: Breast, AJCC 8th Edition - Pathologic stage from 08/23/2021: Stage IA (pT2(2), pN0(sn), cM0, G1, ER+, PR+, HER2-) - Signed by Cornelius Wanda DEL, MD on 09/29/2021 Histopathologic type: Lobular carcinoma, NOS Stage prefix: Initial diagnosis Method of lymph node assessment: Sentinel lymph node biopsy Nuclear grade: G1 Multigene prognostic tests performed: EndoPredict Histologic grading system: 3 grade system Residual tumor (R): R0 - None Laterality: Right Tumor size (mm): 24 Multiple tumors: Yes Number of tumors: 2 Lymph-vascular invasion (LVI): LVI not present (absent)/not identified Diagnostic confirmation: Positive histology PLUS positive immunophenotyping and/or positive genetic studies Specimen type: Excision Staged by: Managing physician Menopausal status: Postmenopausal Ki-67 (%): 5 Stage used in treatment planning: Yes National guidelines used in treatment planning: Yes Type of national guideline used in treatment planning: NCCN   In terms of breast cancer risk profile:  She menarched at early age of 24-13 and went to surgical menopause at age 87 She had 1 pregnancy, her first child was born at age 54 She was exposed hormone replacement therapy for 10 years in the form of a patch.  She has significant positive family history of Breast and ovarian and colon cancer  INTERVAL HISTORY: Vernita is here today for a follow up for breast cancer stage IIA, diagnosed in March, 2023. Patient states that she feels well and has no complaints of pain. She is due for her annual diagnostic mammogram at [facility] in February. Because of the nodularity adjacent to her  lumpectomy scar, I will add a ultrasound, especially since I can feel a 1.5cm nodule at 10 o'clock. Her last bone density scan was done on 07/13/2021. She has a WBC of 6.1, hemoglobin of 13.6, and platelet count of 251,000. Her CMP is normal. I will see her back in 4 months with repeat bone density scan. She denies signs of infection such as sore throat, sinus drainage, cough, or urinary symptoms.  She denies fevers or recurrent chills. She denies pain. She denies nausea, vomiting, chest pain, dyspnea or cough. Her appetite is very good and her weight has increased 1 pounds over last 5 months .   MEDICAL HISTORY:  Past Medical History:  Diagnosis Date   Anxiety    Cancer (HCC)    right breast ILC   Complication of anesthesia    difficulty waking up   Depression    Diverticulitis    Family history of breast cancer    Family history of ovarian cancer    Family history of prostate cancer    Fibromyalgia  GERD (gastroesophageal reflux disease)    History of colonic polyps    Hyperlipidemia    Hypertension    IBS (irritable bowel syndrome)    Ischemic colitis (HCC) 03/2012   Osteoporosis    RLS (restless legs syndrome)    Sinusitis    Sleep apnea    CPAP nightly  Vitamin D deficiency Iron deficiency treated with IV iron infusions in 2019  SURGICAL HISTORY: Past Surgical History:  Procedure Laterality Date   BREAST LUMPECTOMY WITH RADIOACTIVE SEED LOCALIZATION Right 08/04/2021   Procedure: RIGHT BREAST LUMPECTOMY WITH RADIOACTIVE SEED LOCALIZATION;  Surgeon: Vanderbilt Ned, MD;  Location: [facility] OR;  Service: General;  Laterality: Right;   CHOLECYSTECTOMY  2004   COLONOSCOPY WITH PROPOFOL  N/A 04/18/2017   Procedure: COLONOSCOPY WITH PROPOFOL ;  Surgeon: Kristie Lamprey, MD;  Location: [facility] END[facility]COPY;  Service: Endoscopy;  Laterality: N/A;   ESOPHAGOGASTRODUODENOSCOPY (EGD) WITH PROPOFOL  N/A 04/18/2017   Procedure: ESOPHAGOGASTRODUODENOSCOPY (EGD) WITH PROPOFOL ;  Surgeon: Kristie Lamprey, MD;   Location: [facility];  Service: Endoscopy;  Laterality: N/A;   FLEXIBLE SIGMOIDOSCOPY  04/20/2012   Procedure: FLEXIBLE SIGMOIDOSCOPY;  Surgeon: Belvie JONETTA Just, MD;  Location: [facility];  Service: Endoscopy;  Laterality: N/A;   NISSEN FUNDOPLICATION  2011   PARAESOPHAGEAL HERNIA REPAIR  09/11/2009   and Nissen fundoplication   RE-EXCISION OF BREAST LUMPECTOMY Right 08/18/2021   Procedure: RE-EXCISION RIGHT BREAST LUMPECTOMY;  Surgeon: Vanderbilt Ned, MD;  Location: [facility];  Service: General;  Laterality: Right;   SENTINEL NODE BIOPSY N/A 08/04/2021   Procedure: SENTINEL NODE BIOPSY;  Surgeon: Vanderbilt Ned, MD;  Location: [facility] OR;  Service: General;  Laterality: N/A;   TONSILLECTOMY  1960's   TOTAL ABDOMINAL HYSTERECTOMY  2001   w/ BSO  She had 2 large benign tumors of the ovaries removed with bilateral salpingo oophorectomy  SOCIAL HISTORY: Social History   Socioeconomic History   Marital status: Widowed    Spouse name: Lynwood   Number of children: 1   Years of education: Not on file   Highest education level: Some college, no degree  Occupational History   Occupation: disabled  Tobacco Use   Smoking status: Never   Smokeless tobacco: Never   Tobacco comments:    brief exposure through her husband  Vaping Use   Vaping status: Never Used  Substance and Sexual Activity   Alcohol use: Never    Alcohol/week: 0.0 standard drinks of alcohol   Drug use: Never   Sexual activity: Not Currently  Other Topics Concern   Not on file  Social History Narrative   From [place] originally. Previously lived in [place]. Previously worked at [employer] also in a [employer]. Has also worked as a diplomatic services operational officer for [employer]. No pets currently. No bird exposure. No known mold in her current home.    Lives with husband   Caffeine- coffee 2 c daily   Social Drivers of Health   Financial Resource Strain: Low Risk  (02/01/2022)   Overall Financial Resource Strain  (CARDIA)    Difficulty of Paying Living Expenses: Not hard at all  Food Insecurity: No Food Insecurity (02/01/2022)   Hunger Vital Sign    Worried About Running Out of Food in the Last Year: Never true    Ran Out of Food in the Last Year: Never true  Transportation Needs: No Transportation Needs (02/01/2022)   PRAPARE - Administrator, Civil Service (Medical): No    Lack of Transportation (Non-Medical): No  Physical Activity: Not on file  Stress: Not on file  Social Connections: Not on file  Intimate Partner Violence: Not At Risk (02/01/2022)   Humiliation, Afraid, Rape, and Kick questionnaire    Fear of Current or Ex-Partner: No    Emotionally Abused: No    Physically Abused: No    Sexually Abused: No   The patient is here with her husband Alverna and daughter Hospital Doctor today.  She has never smoked and does not drink alcohol or use drugs. FAMILY HISTORY: Family History  Problem Relation Age of Onset   Colon cancer Mother 63   Alzheimer's disease Mother    Heart attack Father    Cancer Maternal Aunt        x2   Alzheimer's disease Maternal Aunt    Ovarian cancer Maternal Aunt    Prostate cancer Maternal Uncle    Kidney disease Maternal Uncle    Atrial fibrillation Maternal Uncle    Stroke Paternal Uncle    Aneurysm Paternal Grandmother        brain   Stroke Paternal Grandfather    Prostate cancer Cousin        paternal first cousin   Prostate cancer Cousin        paternal first cousin   Esophageal cancer Cousin        maternal first cousin   Breast cancer Cousin        DCIS, maternal first cousin   Ovarian cancer Cousin        maternal first cousin   Rheum arthritis Other    Lung disease Neg Hx   Ovarian cancer                                               maternal aunt                         35's Breast cancer                                                 cousin                                     37's Prostate cancer                                               maternal uncle                        49's  ALLERGIES:  is allergic to ceftin [cefuroxime axetil], cefuroxime, pneumococcal vac polyvalent, pneumococcal vaccine, tramadol, and tramadol hcl.  MEDICATIONS:  Current Outpatient Medications  Medication Sig Dispense Refill   acetaminophen  (TYLENOL ) 650 MG CR tablet 1,300 mg in the morning and at bedtime.     ALPRAZolam  (XANAX ) 0.25 MG tablet Take 0.25 mg by mouth at bedtime as needed for anxiety.     amLODipine  (NORVASC ) 5 MG tablet Take 5 mg by mouth daily.     Ascorbic  Acid (VITAMIN C) 500 MG CHEW Chew 500 mg by mouth daily.     aspirin EC 81 MG tablet Take 81 mg by mouth daily. Swallow whole.     atenolol  (TENORMIN ) 50 MG tablet Take 75 mg by mouth daily.      benazepril  (LOTENSIN ) 10 MG tablet Take 10 mg by mouth daily.     benzonatate (TESSALON) 100 MG capsule Take 100-200 mg by mouth at bedtime.     Biotin 1000 MCG tablet Take 1,000 mcg by mouth daily.     Calcium  Citrate-Vitamin D (CITRACAL + D PO) Take 1 tablet by mouth daily.     cetirizine (ZYRTEC) 10 MG tablet Take 10 mg by mouth daily as needed for allergies.      Cholecalciferol (VITAMIN D) 1000 UNITS capsule Take 2,000 Units by mouth daily.      cyanocobalamin  1000 MCG tablet Take 1,000 mcg by mouth daily.     cyclobenzaprine (FLEXERIL) 5 MG tablet Take 5 mg by mouth 3 (three) times daily as needed for muscle spasms.     diclofenac sodium (VOLTAREN) 1 % GEL Apply 1 application topically daily as needed (for pain). Apply as directed     dicyclomine (BENTYL) 10 MG capsule Take 10 mg by mouth 3 (three) times daily as needed for spasms.     diphenhydrAMINE  (BENADRYL ) 25 MG tablet Take 25 mg by mouth every 6 (six) hours as needed for allergies.     diphenoxylate-atropine (LOMOTIL) 2.5-0.025 MG tablet Take 2 tablets by mouth 4 (four) times daily as needed for diarrhea or loose stools.     doxycycline (VIBRA-TABS) 100 MG tablet Take 100 mg by mouth 2 (two) times daily.     DULoxetine   (CYMBALTA ) 60 MG capsule Take 120 mg by mouth daily.      EPINEPHrine  (EPIPEN  2-PAK) 0.3 mg/0.3 mL IJ SOAJ injection See admin instructions.     estradiol  (ESTRACE  VAGINAL) 0.1 MG/GM vaginal cream Place 1 Applicatorful vaginally 3 (three) times a week. 42.5 g 12   famotidine  (PEPCID ) 20 MG tablet Take 20 mg by mouth at bedtime.     fexofenadine (ALLEGRA) 180 MG tablet Take 180 mg by mouth daily.     fluticasone  (FLONASE ) 50 MCG/ACT nasal spray Place 1 spray into both nostrils daily.     Nutritional Supplements (JUICE PLUS FIBRE PO) Take 2 each by mouth daily. Fruits and Vegetables     Omega-3 Fatty Acids (FISH OIL) 1000 MG CAPS Take 1,000 mg by mouth daily.      ondansetron  (ZOFRAN ) 4 MG tablet Take 4 mg by mouth every 8 (eight) hours as needed for nausea or vomiting.     oxyCODONE  (OXY IR/ROXICODONE ) 5 MG immediate release tablet Take 1 tablet (5 mg total) by mouth every 6 (six) hours as needed for severe pain. 15 tablet 0   pantoprazole  (PROTONIX ) 40 MG tablet Take 40 mg by mouth daily.      Phenylephrine -APAP-Guaifenesin (TYLENOL  SINUS SEVERE PO) Take 2 tablets by mouth daily as needed (for congestion).     polyvinyl alcohol (LIQUIFILM TEARS) 1.4 % ophthalmic solution Place 1 drop into both eyes as needed for dry eyes.     Probiotic Product (PROBIOTIC & ACIDOPHILUS EX ST PO) Take 1 capsule by mouth daily. Ultra Flora Plus Capsules     promethazine-dextromethorphan (PROMETHAZINE-DM) 6.25-15 MG/5ML syrup Take 5 mLs by mouth every 6 (six) hours as needed.     Propylene Glycol (SYSTANE BALANCE) 0.6 % SOLN Place 1 drop into both  eyes daily.     rOPINIRole  (REQUIP ) 0.5 MG tablet Take 0.5 mg by mouth at bedtime.     simvastatin  (ZOCOR ) 10 MG tablet Take 10 mg by mouth at bedtime.       sucralfate  (CARAFATE ) 1 GM/10ML suspension Take 1 g by mouth 4 (four) times daily -  with meals and at bedtime.     traZODone  (DESYREL ) 50 MG tablet Take 50 mg by mouth at bedtime.     vitamin E 400 UNIT capsule Take  400 Units by mouth daily.       No current facility-administered medications for this visit.   REVIEW OF SYSTEMS:   Review of Systems  Constitutional: Negative.  Negative for appetite change, chills, diaphoresis, fatigue, fever and unexpected weight change.  HENT:  Negative.  Negative for hearing loss, lump/mass, mouth sores, nosebleeds, sore throat, tinnitus, trouble swallowing and voice change.   Eyes: Negative.  Negative for eye problems and icterus.  Respiratory: Negative.  Negative for chest tightness, cough, hemoptysis, shortness of breath and wheezing.   Cardiovascular: Negative.  Negative for chest pain, leg swelling and palpitations.  Gastrointestinal: Negative.  Negative for abdominal distention, abdominal pain, blood in stool, constipation, diarrhea, nausea, rectal pain and vomiting.  Endocrine: Negative.   Genitourinary: Negative.  Negative for bladder incontinence, difficulty urinating, dyspareunia, dysuria, frequency, hematuria, menstrual problem, nocturia, pelvic pain, vaginal bleeding and vaginal discharge.   Musculoskeletal:  Positive for arthralgias (left foot). Negative for back pain, flank pain, gait problem, myalgias, neck pain and neck stiffness.  Skin: Negative.  Negative for itching, rash and wound.  Neurological:  Negative for dizziness, extremity weakness, gait problem, headaches, light-headedness, numbness, seizures and speech difficulty.  Hematological: Negative.  Negative for adenopathy. Does not bruise/bleed easily.  Psychiatric/Behavioral: Negative.  Negative for confusion, decreased concentration, depression, sleep disturbance and suicidal ideas. The patient is not nervous/anxious.    PHYSICAL EXAMINATION: ECOG PERFORMANCE STATUS: 0 - Asymptomatic Physical Exam Vitals and nursing note reviewed.  Constitutional:      General: She is not in acute distress.    Appearance: Normal appearance. She is normal weight. She is not ill-appearing, toxic-appearing or  diaphoretic.  HENT:     Head: Normocephalic and atraumatic.     Right Ear: Tympanic membrane, ear canal and external ear normal. There is no impacted cerumen.     Left Ear: Tympanic membrane, ear canal and external ear normal. There is no impacted cerumen.     Nose: Nose normal. No congestion or rhinorrhea.     Mouth/Throat:     Mouth: Mucous membranes are moist.     Pharynx: Oropharynx is clear. No oropharyngeal exudate or posterior oropharyngeal erythema.  Eyes:     General: No scleral icterus.       Right eye: No discharge.        Left eye: No discharge.     Extraocular Movements: Extraocular movements intact.     Conjunctiva/sclera: Conjunctivae normal.     Pupils: Pupils are equal, round, and reactive to light.  Neck:     Vascular: No carotid bruit.  Cardiovascular:     Rate and Rhythm: Normal rate and regular rhythm.     Pulses: Normal pulses.     Heart sounds: Normal heart sounds. No murmur heard.    No friction rub. No gallop.  Pulmonary:     Effort: Pulmonary effort is normal. No respiratory distress.     Breath sounds: Normal breath sounds. No stridor. No wheezing, rhonchi or rales.  Chest:     Chest wall: No tenderness.     Comments: No masses in either breast Well healed right axillary scar A nodule measuring 1.5cm at about 10 o'clock adjacent to the areolar complex and scar with additional nodularity superior to that.  Abdominal:     General: Bowel sounds are normal. There is no distension.     Palpations: Abdomen is soft. There is no hepatomegaly, splenomegaly or mass.     Tenderness: There is no abdominal tenderness. There is no right CVA tenderness, left CVA tenderness, guarding or rebound.     Hernia: No hernia is present.  Musculoskeletal:        General: No swelling, deformity or signs of injury.     Right shoulder: No tenderness. Normal range of motion.     Cervical back: Normal range of motion and neck supple. No rigidity or tenderness.     Right lower  leg: No edema.     Left lower leg: No edema.  Lymphadenopathy:     Cervical: No cervical adenopathy.     Right cervical: No superficial, deep or posterior cervical adenopathy.    Left cervical: No superficial, deep or posterior cervical adenopathy.     Upper Body:     Right upper body: No supraclavicular, axillary or pectoral adenopathy.     Left upper body: No supraclavicular, axillary or pectoral adenopathy.  Skin:    General: Skin is warm and dry.     Coloration: Skin is not jaundiced or pale.     Findings: No bruising, erythema, lesion or rash.  Neurological:     General: No focal deficit present.     Mental Status: She is alert and oriented to person, place, and time. Mental status is at baseline.     Cranial Nerves: No cranial nerve deficit.     Sensory: No sensory deficit.     Motor: No weakness.     Coordination: Coordination normal.     Gait: Gait normal.     Deep Tendon Reflexes: Reflexes normal.  Psychiatric:        Mood and Affect: Mood normal.        Behavior: Behavior normal.        Thought Content: Thought content normal.        Judgment: Judgment normal.    LABORATORY DATA:  I have reviewed the data as listed Lab Results  Component Value Date   WBC 6.1 04/06/2023   HGB 13.6 04/06/2023   HCT 40.5 04/06/2023   MCV 86.0 04/06/2023   PLT 251 04/06/2023   Lab Results  Component Value Date   CREATININE 0.98 04/06/2023   BUN 15 04/06/2023   NA 140 04/06/2023   K 4.0 04/06/2023   CL 102 04/06/2023   CO2 28 04/06/2023      Component Value Date/Time   PROT 7.0 04/06/2023 1538   ALBUMIN 4.5 04/06/2023 1538   AST 27 04/06/2023 1538   ALT 32 04/06/2023 1538   ALKPHOS 72 04/06/2023 1538   BILITOT 0.3 04/06/2023 1538     RADIOGRAPHIC STUDIES: No results found.     I,Jasmine M Lassiter,acting as a scribe for [name], MD.,have documented all relevant documentation on the behalf of [name], MD,as directed by  [name],  MD while in the presence of [name], MD.

## 2023-05-04 ENCOUNTER — Ambulatory Visit: Payer: Medicare Other | Admitting: Gynecologic Oncology

## 2023-05-04 DIAGNOSIS — Z853 Personal history of malignant neoplasm of breast: Secondary | ICD-10-CM | POA: Diagnosis not present

## 2023-05-04 LAB — HM MAMMOGRAPHY

## 2023-05-05 ENCOUNTER — Ambulatory Visit: Payer: Medicare Other | Admitting: Gynecologic Oncology

## 2023-05-09 ENCOUNTER — Encounter: Payer: Self-pay | Admitting: Gynecologic Oncology

## 2023-05-11 ENCOUNTER — Inpatient Hospital Stay: Payer: Medicare Other | Attending: Oncology | Admitting: Gynecologic Oncology

## 2023-05-11 ENCOUNTER — Encounter: Payer: Self-pay | Admitting: Gynecologic Oncology

## 2023-05-11 VITALS — BP 131/62 | HR 60 | Temp 98.0°F | Resp 19 | Wt 218.4 lb

## 2023-05-11 DIAGNOSIS — N890 Mild vaginal dysplasia: Secondary | ICD-10-CM | POA: Diagnosis not present

## 2023-05-11 DIAGNOSIS — Z853 Personal history of malignant neoplasm of breast: Secondary | ICD-10-CM | POA: Insufficient documentation

## 2023-05-11 NOTE — Patient Instructions (Signed)
We will see you soon for biopsy of your vulva.  Please keep using the vaginal estrogen 3 times a week.

## 2023-05-11 NOTE — Progress Notes (Signed)
Gynecologic Oncology Return Clinic Visit  05/11/23  Reason for Visit: follow-up  Treatment History: Oncology History  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)  06/08/2021 Initial Diagnosis   Breast cancer in female [facility])   07/23/2021 Genetic Testing   Negative hereditary cancer genetic testing: no pathogenic variants detected in [facility] BRCAPlus Panel. Report date is July 23, 2021.  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  The report date is Jul 26, 2021.  The BRCAplus panel offered by [employer] [facility] and includes sequencing and deletion/duplication analysis for the following 8 genes: ATM, BRCA1, BRCA2, CDH1, CHEK2, PALB2, PTEN, and TP53.  Results of pan-cancer panel pending.   The CancerNext-Expanded gene panel offered by [facility] and includes sequencing and rearrangement analysis for the following 77 genes: AIP, ALK, APC*, ATM*, AXIN2, BAP1, BARD1, BLM, BMPR1A, BRCA1*, BRCA2*, BRIP1*, CDC73, CDH1*, CDK4, CDKN1B, CDKN2A, CHEK2*, CTNNA1, DICER1, FANCC, FH, FLCN, GALNT12, KIF1B, LZTR1, MAX, MEN1, MET, MLH1*, MSH2*, MSH3, MSH6*, MUTYH*, NBN, NF1*, NF2, NTHL1, PALB2*, PHOX2B, PMS2*, POT1, PRKAR1A, PTCH1, PTEN*, RAD51C*, RAD51D*, RB1, RECQL, RET, SDHA, SDHAF2, SDHB, SDHC, SDHD, SMAD4, SMARCA4, SMARCB1, SMARCE1, STK11, SUFU, TMEM127, TP53*, TSC1, TSC2, VHL and XRCC2 (sequencing and deletion/duplication); EGFR, EGLN1, HOXB13, KIT, MITF, PDGFRA, POLD1, and POLE (sequencing only); EPCAM and GREM1 (deletion/duplication only). DNA and RNA analyses performed for * genes.    08/23/2021 Cancer Staging   Staging form: Breast, AJCC 8th Edition - Pathologic stage from 08/23/2021: Stage IA (pT2(2), pN0(sn), cM0, G1, ER+, PR+, HER2-) - Signed by Dellia Beckwith, MD on 09/29/2021 Histopathologic type: Lobular carcinoma, NOS Stage prefix: Initial diagnosis Method of  lymph node assessment: Sentinel lymph node biopsy Nuclear grade: G1 Multigene prognostic tests performed: EndoPredict Histologic grading system: 3 grade system Residual tumor (R): R0 - None Laterality: Right Tumor size (mm): 24 Multiple tumors: Yes Number of tumors: 2 Lymph-vascular invasion (LVI): LVI not present (absent)/not identified Diagnostic confirmation: Positive histology PLUS positive immunophenotyping and/or positive genetic studies Specimen type: Excision Staged by: Managing physician Menopausal status: Postmenopausal Ki-67 (%): 5 Stage used in treatment planning: Yes National guidelines used in treatment planning: Yes Type of national guideline used in treatment planning: NCCN    The patient endorses having occasional abnormal Pap smears prior to her hysterectomy in 2011 for fibroids.  She denies ever having cervical biopsies or procedures done, thinks that Pap smears were just repeated.  After her hysterectomy, she had a period of time where she did not get follow-up.  Since establishing with her OB/GYN provider, she has been getting Pap smears which were initially normal.  Her more recent Pap history is noted below.   09/21/20: ASCUS pap 09/27/2021: ASC-H pap 11/03/21: Vaginoscopy. 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 Biopsy shows squamous mucosa with mild koilocytic atypia c/w LSIL lesion.  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  Notes that she can feel the areas in her vagina now when she puts the vaginal estrogen in.  Having some intermittent itching and burning.  Notes some discomfort when she is sitting.  Past Medical/Surgical History: Past Medical History:  Diagnosis Date   Anxiety    Cancer (HCC)    right  breast ILC   Complication  of anesthesia    difficulty waking up   Depression    Diverticulitis    Family history of breast cancer    Family history of ovarian cancer    Family history of prostate cancer    Fibromyalgia    GERD (gastroesophageal reflux disease)    History of colonic polyps    Hyperlipidemia    Hypertension    IBS (irritable bowel syndrome)    Ischemic colitis (HCC) 03/2012   Osteoporosis    RLS (restless legs syndrome)    Sinusitis    Sleep apnea    CPAP nightly    Past Surgical History:  Procedure Laterality Date   BREAST LUMPECTOMY WITH RADIOACTIVE SEED LOCALIZATION Right 08/04/2021   Procedure: RIGHT BREAST LUMPECTOMY WITH RADIOACTIVE SEED LOCALIZATION;  Surgeon: Harriette Bouillon, MD;  Location: [facility];  Service: General;  Laterality: Right;   CHOLECYSTECTOMY  2004   COLONOSCOPY WITH PROPOFOL N/A 04/18/2017   Procedure: COLONOSCOPY WITH PROPOFOL;  Surgeon: Charna Elizabeth, MD;  Location: [facility];  Service: Endoscopy;  Laterality: N/A;   ESOPHAGOGASTRODUODENOSCOPY (EGD) WITH PROPOFOL N/A 04/18/2017   Procedure: ESOPHAGOGASTRODUODENOSCOPY (EGD) WITH PROPOFOL;  Surgeon: Charna Elizabeth, MD;  Location: [facility];  Service: Endoscopy;  Laterality: N/A;   FLEXIBLE SIGMOIDOSCOPY  04/20/2012   Procedure: FLEXIBLE SIGMOIDOSCOPY;  Surgeon: Theda Belfast, MD;  Location: [facility];  Service: Endoscopy;  Laterality: N/A;   NISSEN FUNDOPLICATION  2011   PARAESOPHAGEAL HERNIA REPAIR  09/11/2009   and Nissen fundoplication   RE-EXCISION OF BREAST LUMPECTOMY Right 08/18/2021   Procedure: RE-EXCISION RIGHT BREAST LUMPECTOMY;  Surgeon: Harriette Bouillon, MD;  Location: [facility];  Service: General;  Laterality: Right;   SENTINEL NODE BIOPSY N/A 08/04/2021   Procedure: SENTINEL NODE BIOPSY;  Surgeon: Harriette Bouillon, MD;  Location: [facility];  Service: General;  Laterality: N/A;   TONSILLECTOMY  1960's   TOTAL ABDOMINAL HYSTERECTOMY  2001   w/ BSO    Family  History  Problem Relation Age of Onset   Colon cancer Mother 24   Alzheimer's disease Mother    Heart attack Father    Cancer Maternal Aunt        x2   Alzheimer's disease Maternal Aunt    Ovarian cancer Maternal Aunt    Prostate cancer Maternal Uncle    Kidney disease Maternal Uncle    Atrial fibrillation Maternal Uncle    Stroke Paternal Uncle    Aneurysm Paternal Grandmother        brain   Stroke Paternal Grandfather    Prostate cancer Cousin        paternal first cousin   Prostate cancer Cousin        paternal first cousin   Esophageal cancer Cousin        maternal first cousin   Breast cancer Cousin        DCIS, maternal first cousin   Ovarian cancer Cousin        maternal first cousin   Rheum arthritis Other    Lung disease Neg Hx     Social History   Socioeconomic History   Marital status: Widowed    Spouse name: Fayrene Fearing   Number of children: 1   Years of education: Not on file   Highest education level: Some college, no degree  Occupational History   Occupation: disabled  Tobacco Use   Smoking status: Never   Smokeless tobacco: Never   Tobacco comments:    brief exposure through her husband  Vaping Use   Vaping status: Never Used  Substance and Sexual Activity   Alcohol use: Never    Alcohol/week: 0.0 standard drinks of alcohol   Drug use: Never   Sexual activity: Not Currently  Other Topics Concern   Not on file  Social History Narrative   From [place] originally. Previously lived in [place]. Previously worked at [employer] also in a [employer]. Has also worked as a Diplomatic Services operational officer for [employer]. No pets currently. No bird exposure. No known mold in her current home.    Lives with husband   Caffeine- coffee 2 c daily   Social Drivers of Health   Financial Resource Strain: Low Risk  (02/01/2022)   Overall Financial Resource Strain (CARDIA)    Difficulty of Paying Living Expenses: Not hard at all  Food Insecurity: No Food Insecurity (02/01/2022)    Hunger Vital Sign    Worried About Running Out of Food in the Last Year: Never true    Ran Out of Food in the Last Year: Never true  Transportation Needs: No Transportation Needs (02/01/2022)   PRAPARE - Administrator, Civil Service (Medical): No    Lack of Transportation (Non-Medical): No  Physical Activity: Not on file  Stress: Not on file  Social Connections: Not on file    Current Medications:  Current Outpatient Medications:    acetaminophen (TYLENOL) 650 MG CR tablet, 1,300 mg in the morning and at bedtime., Disp: , Rfl:    ALPRAZolam (XANAX) 0.25 MG tablet, Take 0.25 mg by mouth at bedtime as needed for anxiety., Disp: , Rfl:    amLODipine (NORVASC) 5 MG tablet, Take 5 mg by mouth daily., Disp: , Rfl:    Ascorbic Acid (VITAMIN C) 500 MG CHEW, Chew 500 mg by mouth daily., Disp: , Rfl:    aspirin EC 81 MG tablet, Take 81 mg by mouth daily. Swallow whole., Disp: , Rfl:    atenolol (TENORMIN) 50 MG tablet, Take 75 mg by mouth daily. , Disp: , Rfl:    benazepril (LOTENSIN) 10 MG tablet, Take 10 mg by mouth daily., Disp: , Rfl:    benzonatate (TESSALON) 100 MG capsule, Take 100-200 mg by mouth at bedtime., Disp: , Rfl:    Biotin 1000 MCG tablet, Take 1,000 mcg by mouth daily., Disp: , Rfl:    Calcium Citrate-Vitamin D (CITRACAL + D PO), Take 1 tablet by mouth daily., Disp: , Rfl:    cetirizine (ZYRTEC) 10 MG tablet, Take 10 mg by mouth daily as needed for allergies. , Disp: , Rfl:    Cholecalciferol (VITAMIN D) 1000 UNITS capsule, Take 2,000 Units by mouth daily. , Disp: , Rfl:    cyanocobalamin 1000 MCG tablet, Take 1,000 mcg by mouth daily., Disp: , Rfl:    diclofenac sodium (VOLTAREN) 1 % GEL, Apply 1 application topically daily as needed (for pain). Apply as directed, Disp: , Rfl:    dicyclomine (BENTYL) 10 MG capsule, Take 10 mg by mouth 3 (three) times daily as needed for spasms., Disp: , Rfl:    diphenhydrAMINE (BENADRYL) 25 MG tablet, Take 25 mg by mouth every 6  (six) hours as needed for allergies., Disp: , Rfl:    diphenoxylate-atropine (LOMOTIL) 2.5-0.025 MG tablet, Take 2 tablets by mouth 4 (four) times daily as needed for diarrhea or loose stools., Disp: , Rfl:    doxycycline (VIBRA-TABS) 100 MG tablet, Take 100 mg by mouth 2 (two) times daily., Disp: , Rfl:    DULoxetine (  CYMBALTA) 60 MG capsule, Take 120 mg by mouth daily. , Disp: , Rfl:    EPINEPHrine (EPIPEN 2-PAK) 0.3 mg/0.3 mL IJ SOAJ injection, See admin instructions., Disp: , Rfl:    estradiol (ESTRACE VAGINAL) 0.1 MG/GM vaginal cream, Place 1 Applicatorful vaginally 3 (three) times a week., Disp: 42.5 g, Rfl: 12   famotidine (PEPCID) 20 MG tablet, Take 20 mg by mouth at bedtime., Disp: , Rfl:    fexofenadine (ALLEGRA) 180 MG tablet, Take 180 mg by mouth daily., Disp: , Rfl:    fluticasone (FLONASE) 50 MCG/ACT nasal spray, Place 1 spray into both nostrils daily., Disp: , Rfl:    Nutritional Supplements (JUICE PLUS FIBRE PO), Take 2 each by mouth daily. Fruits and Vegetables, Disp: , Rfl:    Omega-3 Fatty Acids (FISH OIL) 1000 MG CAPS, Take 1,000 mg by mouth daily. , Disp: , Rfl:    ondansetron (ZOFRAN) 4 MG tablet, Take 4 mg by mouth every 8 (eight) hours as needed for nausea or vomiting., Disp: , Rfl:    pantoprazole (PROTONIX) 40 MG tablet, Take 40 mg by mouth daily. , Disp: , Rfl:    Phenylephrine-APAP-Guaifenesin (TYLENOL SINUS SEVERE PO), Take 2 tablets by mouth daily as needed (for congestion)., Disp: , Rfl:    polyvinyl alcohol (LIQUIFILM TEARS) 1.4 % ophthalmic solution, Place 1 drop into both eyes as needed for dry eyes., Disp: , Rfl:    Probiotic Product (PROBIOTIC & ACIDOPHILUS EX ST PO), Take 1 capsule by mouth daily. Ultra Flora Plus Capsules, Disp: , Rfl:    promethazine-dextromethorphan (PROMETHAZINE-DM) 6.25-15 MG/5ML syrup, Take 5 mLs by mouth every 6 (six) hours as needed., Disp: , Rfl:    Propylene Glycol (SYSTANE BALANCE) 0.6 % SOLN, Place 1 drop into both eyes daily., Disp:  , Rfl:    rOPINIRole (REQUIP) 0.5 MG tablet, Take 0.5 mg by mouth at bedtime., Disp: , Rfl:    simvastatin (ZOCOR) 10 MG tablet, Take 10 mg by mouth at bedtime.  , Disp: , Rfl:    sucralfate (CARAFATE) 1 GM/10ML suspension, Take 1 g by mouth 4 (four) times daily -  with meals and at bedtime., Disp: , Rfl:    vitamin E 400 UNIT capsule, Take 400 Units by mouth daily.  , Disp: , Rfl:   Review of Systems: Denies appetite changes, fevers, chills, fatigue, unexplained weight changes. Denies hearing loss, neck lumps or masses, mouth sores, ringing in ears or voice changes. Denies cough or wheezing.  Denies shortness of breath. Denies chest pain or palpitations. Denies leg swelling. Denies abdominal distention, pain, blood in stools, constipation, diarrhea, nausea, vomiting, or early satiety. Denies pain with intercourse, dysuria, frequency, hematuria or incontinence. Denies joint pain, back pain or muscle pain/cramps. Denies itching, rash, or wounds. Denies dizziness, headaches, numbness or seizures. Denies swollen lymph nodes or glands, denies easy bruising or bleeding. Denies anxiety, depression, confusion, or decreased concentration.  Physical Exam: BP 131/62 (BP Location: Left Arm, Patient Position: Sitting)   Pulse 60   Temp 98 F (36.7 C) (Oral)   Resp 19   Wt 218 lb 6.4 oz (99.1 kg)   SpO2 95%   BMI 38.69 kg/m  General: Alert, oriented, no acute distress.  GU: Moderate atrophy of vulvar tissue and loss of architecture of posterior labia with some cigarette paper appearance around the anus.  Patient has a 1 cm ulceration along the left mid vulva.  Significant tenderness with palpation of this.  On speculum exam, no vaginal lesions seen,  moderate atrophy noted.  After application of 5% acetic acid, no acetowhite changes noted at the upper vagina.  Laboratory & Radiologic Studies: None new  Assessment & Plan: Doris Reyes is a 73 y.o. woman with discrepancy between Pap and  vaginal biopsy. 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 HR HPV testing at same time was negative.   Patient doing well from a vaginal dysplasia standpoint.  Colposcopy performed today without significant findings.  She has significant change in vulvar exam findings, new ulcerative lesion on the left vulva.  Recommended biopsy.  She is concerned about doing this today as she does not have somebody to drive her home.  She will be scheduled for follow-up in the next 1-2 weeks for biopsy.  20 minutes of total time was spent for this patient encounter, including preparation, face-to-face counseling with the patient and coordination of care, and documentation of the encounter.  Eugene Garnet, MD  Division of Gynecologic Oncology  Department of Obstetrics and Gynecology  [facility]

## 2023-05-17 ENCOUNTER — Telehealth: Payer: Self-pay | Admitting: *Deleted

## 2023-05-17 NOTE — Telephone Encounter (Signed)
Patient called and canceled her appt for tomorrow due to the weather. Explained that the message would be given to the provider and the office will call her back with another appt. Patient stated that she can't come today; has no transportation.   Explained that the provider may have her see one of her partners. Patient was agreeable to see a partner.

## 2023-05-17 NOTE — Telephone Encounter (Addendum)
Per Dr Pricilla Holm patient scheduled with Warner Mccreedy NP on 2/27 at 11 am. Patient aware

## 2023-05-18 ENCOUNTER — Inpatient Hospital Stay: Payer: Medicare Other | Admitting: Gynecologic Oncology

## 2023-05-19 ENCOUNTER — Encounter: Payer: Self-pay | Admitting: Oncology

## 2023-05-25 ENCOUNTER — Other Ambulatory Visit: Payer: Self-pay

## 2023-05-25 ENCOUNTER — Inpatient Hospital Stay (HOSPITAL_BASED_OUTPATIENT_CLINIC_OR_DEPARTMENT_OTHER): Payer: Medicare Other | Admitting: Gynecologic Oncology

## 2023-05-25 VITALS — BP 130/71 | HR 58 | Temp 97.9°F | Resp 18 | Ht 63.0 in | Wt 215.0 lb

## 2023-05-25 DIAGNOSIS — N890 Mild vaginal dysplasia: Secondary | ICD-10-CM

## 2023-05-25 DIAGNOSIS — N9089 Other specified noninflammatory disorders of vulva and perineum: Secondary | ICD-10-CM

## 2023-05-25 DIAGNOSIS — C159 Malignant neoplasm of esophagus, unspecified: Secondary | ICD-10-CM | POA: Diagnosis not present

## 2023-05-25 DIAGNOSIS — Z853 Personal history of malignant neoplasm of breast: Secondary | ICD-10-CM | POA: Diagnosis not present

## 2023-05-25 NOTE — Progress Notes (Signed)
 Gynecologic Oncology Return Clinic Visit  05/25/23  Reason for Visit: visit for biopsy of vulvar lesion, hx vaginal dysplasia  Treatment History: Oncology History  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)  06/08/2021 Initial Diagnosis   Breast cancer in female [facility])   07/23/2021 Genetic Testing   Negative hereditary cancer genetic testing: no pathogenic variants detected in [facility] BRCAPlus Panel. Report date is July 23, 2021.  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  The report date is Jul 26, 2021.  The BRCAplus panel offered by [employer] [facility] and includes sequencing and deletion/duplication analysis for the following 8 genes: ATM, BRCA1, BRCA2, CDH1, CHEK2, PALB2, PTEN, and TP53.  Results of pan-cancer panel pending.   The CancerNext-Expanded gene panel offered by [facility] and includes sequencing and rearrangement analysis for the following 77 genes: AIP, ALK, APC*, ATM*, AXIN2, BAP1, BARD1, BLM, BMPR1A, BRCA1*, BRCA2*, BRIP1*, CDC73, CDH1*, CDK4, CDKN1B, CDKN2A, CHEK2*, CTNNA1, DICER1, FANCC, FH, FLCN, GALNT12, KIF1B, LZTR1, MAX, MEN1, MET, MLH1*, MSH2*, MSH3, MSH6*, MUTYH*, NBN, NF1*, NF2, NTHL1, PALB2*, PHOX2B, PMS2*, POT1, PRKAR1A, PTCH1, PTEN*, RAD51C*, RAD51D*, RB1, RECQL, RET, SDHA, SDHAF2, SDHB, SDHC, SDHD, SMAD4, SMARCA4, SMARCB1, SMARCE1, STK11, SUFU, TMEM127, TP53*, TSC1, TSC2, VHL and XRCC2 (sequencing and deletion/duplication); EGFR, EGLN1, HOXB13, KIT, MITF, PDGFRA, POLD1, and POLE (sequencing only); EPCAM and GREM1 (deletion/duplication only). DNA and RNA analyses performed for * genes.    08/23/2021 Cancer Staging   Staging form: Breast, AJCC 8th Edition - Pathologic stage from 08/23/2021: Stage IA (pT2(2), pN0(sn), cM0, G1, ER+, PR+, HER2-) - Signed by Dellia Beckwith, MD on 09/29/2021 Histopathologic type: Lobular carcinoma,  NOS Stage prefix: Initial diagnosis Method of lymph node assessment: Sentinel lymph node biopsy Nuclear grade: G1 Multigene prognostic tests performed: EndoPredict Histologic grading system: 3 grade system Residual tumor (R): R0 - None Laterality: Right Tumor size (mm): 24 Multiple tumors: Yes Number of tumors: 2 Lymph-vascular invasion (LVI): LVI not present (absent)/not identified Diagnostic confirmation: Positive histology PLUS positive immunophenotyping and/or positive genetic studies Specimen type: Excision Staged by: Managing physician Menopausal status: Postmenopausal Ki-67 (%): 5 Stage used in treatment planning: Yes National guidelines used in treatment planning: Yes Type of national guideline used in treatment planning: NCCN    The patient endorses having occasional abnormal Pap smears prior to her hysterectomy in 2011 for fibroids.  She denies ever having cervical biopsies or procedures done, thinks that Pap smears were just repeated.  After her hysterectomy, she had a period of time where she did not get follow-up.  Since establishing with her OB/GYN provider, she has been getting Pap smears which were initially normal.  Her more recent Pap history is noted below.   09/21/20: ASCUS pap 09/27/2021: ASC-H pap 11/03/21: Vaginoscopy. 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 Biopsy shows squamous mucosa with mild koilocytic atypia c/w LSIL lesion.  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 The area on her left vulva has been sore, itchy, and irritated. She has challenges sitting due to this discomfort. She has been using the vaginal estrogen and reports having a discharge after this. No other concerns voiced.      Past Medical/Surgical History: Past Medical History:  Diagnosis Date   Anxiety    Cancer (HCC)    right breast ILC   Complication of anesthesia    difficulty waking up   Depression    Diverticulitis    Family history of breast cancer    Family history of ovarian cancer    Family history of prostate cancer    Fibromyalgia    GERD (gastroesophageal reflux disease)    History of colonic polyps    Hyperlipidemia    Hypertension    IBS (irritable bowel syndrome)    Ischemic colitis (HCC) 03/2012   Osteoporosis    RLS (restless legs syndrome)    Sinusitis    Sleep apnea    CPAP nightly    Past Surgical History:  Procedure Laterality Date   BREAST LUMPECTOMY WITH RADIOACTIVE SEED LOCALIZATION Right 08/04/2021   Procedure: RIGHT BREAST LUMPECTOMY WITH RADIOACTIVE SEED LOCALIZATION;  Surgeon: Harriette Bouillon, MD;  Location: [facility];  Service: General;  Laterality: Right;   CHOLECYSTECTOMY  2004   COLONOSCOPY WITH PROPOFOL N/A 04/18/2017   Procedure: COLONOSCOPY WITH PROPOFOL;  Surgeon: Charna Elizabeth, MD;  Location: [facility];  Service: Endoscopy;  Laterality: N/A;   ESOPHAGOGASTRODUODENOSCOPY (EGD) WITH PROPOFOL N/A 04/18/2017   Procedure: ESOPHAGOGASTRODUODENOSCOPY (EGD) WITH PROPOFOL;  Surgeon: Charna Elizabeth, MD;  Location: [facility];  Service: Endoscopy;  Laterality: N/A;   FLEXIBLE SIGMOIDOSCOPY  04/20/2012   Procedure: FLEXIBLE SIGMOIDOSCOPY;  Surgeon: Theda Belfast, MD;  Location: [facility];  Service: Endoscopy;  Laterality: N/A;   NISSEN FUNDOPLICATION  2011   PARAESOPHAGEAL HERNIA REPAIR  09/11/2009   and Nissen fundoplication   RE-EXCISION OF BREAST LUMPECTOMY Right 08/18/2021   Procedure: RE-EXCISION RIGHT BREAST LUMPECTOMY;  Surgeon: Harriette Bouillon, MD;  Location: [facility];  Service: General;  Laterality: Right;   SENTINEL NODE BIOPSY N/A 08/04/2021   Procedure: SENTINEL NODE BIOPSY;  Surgeon: Harriette Bouillon, MD;  Location: [facility];  Service:  General;  Laterality: N/A;   TONSILLECTOMY  1960's   TOTAL ABDOMINAL HYSTERECTOMY  2001   w/ BSO    Family History  Problem Relation Age of Onset   Colon cancer Mother 56   Alzheimer's disease Mother    Heart attack Father    Cancer Maternal Aunt        x2   Alzheimer's disease Maternal Aunt    Ovarian cancer Maternal Aunt    Prostate cancer Maternal Uncle    Kidney disease Maternal Uncle    Atrial fibrillation Maternal Uncle    Stroke Paternal Uncle    Aneurysm Paternal Grandmother        brain   Stroke Paternal Grandfather    Prostate cancer Cousin        paternal first cousin   Prostate cancer Cousin        paternal first cousin   Esophageal cancer Cousin        maternal first cousin   Breast cancer Cousin        DCIS, maternal first cousin   Ovarian cancer Cousin        maternal first cousin   Rheum arthritis Other    Lung disease Neg Hx     Social History   Socioeconomic History   Marital status: Widowed    Spouse name: Fayrene Fearing   Number of children: 1   Years of education: Not on file   Highest education level: Some college, no degree  Occupational History   Occupation: disabled  Tobacco Use   Smoking status:  Never   Smokeless tobacco: Never   Tobacco comments:    brief exposure through her husband  Vaping Use   Vaping status: Never Used  Substance and Sexual Activity   Alcohol use: Never    Alcohol/week: 0.0 standard drinks of alcohol   Drug use: Never   Sexual activity: Not Currently  Other Topics Concern   Not on file  Social History Narrative   From [place]. Previously lived in [place]. Previously worked at [employer] also in a [employer]. Has also worked as a Diplomatic Services operational officer for [employer]. No pets currently. No bird exposure. No known mold in her current home.    Lives with husband   Caffeine- coffee 2 c daily   Social Drivers of Health   Financial Resource Strain: Low Risk  (02/01/2022)   Overall Financial Resource Strain  (CARDIA)    Difficulty of Paying Living Expenses: Not hard at all  Food Insecurity: No Food Insecurity (02/01/2022)   Hunger Vital Sign    Worried About Running Out of Food in the Last Year: Never true    Ran Out of Food in the Last Year: Never true  Transportation Needs: No Transportation Needs (02/01/2022)   PRAPARE - Administrator, Civil Service (Medical): No    Lack of Transportation (Non-Medical): No  Physical Activity: Not on file  Stress: Not on file  Social Connections: Not on file    Current Medications:  Current Outpatient Medications:    acetaminophen (TYLENOL) 650 MG CR tablet, 1,300 mg in the morning and at bedtime., Disp: , Rfl:    ALPRAZolam (XANAX) 0.25 MG tablet, Take 0.25 mg by mouth at bedtime as needed for anxiety., Disp: , Rfl:    amLODipine (NORVASC) 5 MG tablet, Take 5 mg by mouth daily., Disp: , Rfl:    Ascorbic Acid (VITAMIN C) 500 MG CHEW, Chew 500 mg by mouth daily., Disp: , Rfl:    aspirin EC 81 MG tablet, Take 81 mg by mouth daily. Swallow whole., Disp: , Rfl:    atenolol (TENORMIN) 50 MG tablet, Take 75 mg by mouth daily. , Disp: , Rfl:    benazepril (LOTENSIN) 10 MG tablet, Take 10 mg by mouth daily., Disp: , Rfl:    benzonatate (TESSALON) 100 MG capsule, Take 100-200 mg by mouth at bedtime., Disp: , Rfl:    Biotin 1000 MCG tablet, Take 1,000 mcg by mouth daily., Disp: , Rfl:    Calcium Citrate-Vitamin D (CITRACAL + D PO), Take 1 tablet by mouth daily., Disp: , Rfl:    cetirizine (ZYRTEC) 10 MG tablet, Take 10 mg by mouth daily as needed for allergies. , Disp: , Rfl:    Cholecalciferol (VITAMIN D) 1000 UNITS capsule, Take 2,000 Units by mouth daily. , Disp: , Rfl:    cyanocobalamin 1000 MCG tablet, Take 1,000 mcg by mouth daily., Disp: , Rfl:    diclofenac sodium (VOLTAREN) 1 % GEL, Apply 1 application topically daily as needed (for pain). Apply as directed, Disp: , Rfl:    dicyclomine (BENTYL) 10 MG capsule, Take 10 mg by mouth 3 (three) times  daily as needed for spasms., Disp: , Rfl:    diphenhydrAMINE (BENADRYL) 25 MG tablet, Take 25 mg by mouth every 6 (six) hours as needed for allergies., Disp: , Rfl:    diphenoxylate-atropine (LOMOTIL) 2.5-0.025 MG tablet, Take 2 tablets by mouth 4 (four) times daily as needed for diarrhea or loose stools., Disp: , Rfl:    doxycycline (VIBRA-TABS)  100 MG tablet, Take 100 mg by mouth 2 (two) times daily., Disp: , Rfl:    DULoxetine (CYMBALTA) 60 MG capsule, Take 120 mg by mouth daily. , Disp: , Rfl:    EPINEPHrine (EPIPEN 2-PAK) 0.3 mg/0.3 mL IJ SOAJ injection, See admin instructions., Disp: , Rfl:    estradiol (ESTRACE VAGINAL) 0.1 MG/GM vaginal cream, Place 1 Applicatorful vaginally 3 (three) times a week., Disp: 42.5 g, Rfl: 12   famotidine (PEPCID) 20 MG tablet, Take 20 mg by mouth at bedtime., Disp: , Rfl:    fexofenadine (ALLEGRA) 180 MG tablet, Take 180 mg by mouth daily., Disp: , Rfl:    fluticasone (FLONASE) 50 MCG/ACT nasal spray, Place 1 spray into both nostrils daily., Disp: , Rfl:    Nutritional Supplements (JUICE PLUS FIBRE PO), Take 2 each by mouth daily. Fruits and Vegetables, Disp: , Rfl:    Omega-3 Fatty Acids (FISH OIL) 1000 MG CAPS, Take 1,000 mg by mouth daily. , Disp: , Rfl:    ondansetron (ZOFRAN) 4 MG tablet, Take 4 mg by mouth every 8 (eight) hours as needed for nausea or vomiting., Disp: , Rfl:    pantoprazole (PROTONIX) 40 MG tablet, Take 40 mg by mouth daily. , Disp: , Rfl:    Phenylephrine-APAP-Guaifenesin (TYLENOL SINUS SEVERE PO), Take 2 tablets by mouth daily as needed (for congestion)., Disp: , Rfl:    polyvinyl alcohol (LIQUIFILM TEARS) 1.4 % ophthalmic solution, Place 1 drop into both eyes as needed for dry eyes., Disp: , Rfl:    Probiotic Product (PROBIOTIC & ACIDOPHILUS EX ST PO), Take 1 capsule by mouth daily. Ultra Flora Plus Capsules, Disp: , Rfl:    promethazine-dextromethorphan (PROMETHAZINE-DM) 6.25-15 MG/5ML syrup, Take 5 mLs by mouth every 6 (six) hours as  needed., Disp: , Rfl:    Propylene Glycol (SYSTANE BALANCE) 0.6 % SOLN, Place 1 drop into both eyes daily., Disp: , Rfl:    rOPINIRole (REQUIP) 0.5 MG tablet, Take 0.5 mg by mouth at bedtime., Disp: , Rfl:    simvastatin (ZOCOR) 10 MG tablet, Take 10 mg by mouth at bedtime.  , Disp: , Rfl:    sucralfate (CARAFATE) 1 GM/10ML suspension, Take 1 g by mouth 4 (four) times daily -  with meals and at bedtime., Disp: , Rfl:    vitamin E 400 UNIT capsule, Take 400 Units by mouth daily.  , Disp: , Rfl:   Review of Systems: See interval. Additional review negative  Physical Exam: BP 130/71 (BP Location: Right Arm, Patient Position: Sitting)   Pulse (!) 58   Temp 97.9 F (36.6 C) (Oral)   Resp 18   Ht 5\' 3"  (1.6 m)   Wt 215 lb (97.5 kg)   SpO2 100%   BMI 38.09 kg/m  General: Alert, oriented, no acute distress. GU: Moderate atrophy of vulvar tissue and loss of architecture of posterior labia with some cigarette paper appearance around the anus.  Patient has a 1 cm ulceration along the left mid vulva.  Significant tenderness with palpation of this. Vagina/vulva recently assessed by Dr. Pricilla Holm as well. Per recommendations of Dr. Pricilla Holm, plan for vulvar biopsy today of vulvar lesion. Verbal and written consent obtained.   Vulvar biopsy procedure Preoperative diagnosis: history of VAIN, new vulvar lesion Postoperative diagnosis: Same as above Provider: Warner Mccreedy NP Estimated blood loss: Minimal Specimens: vulvar biopsy Procedure: After the procedure was discussed with the patient including risks and benefits, she gave verbal consent.  She was then placed in dorsolithotomy position  the vulvar area to be biopsy was examined with findings as above.  The area was cleansed with Betadine x 3 with plan for biopsy of the desquamative area at the superior aspect of the leukoplakia.  1 cc of 2% lidocaine was then injected.  After adequate time for anesthesia was given, a 3 mm punch biopsy was taken.  This  was placed in formalin.  Silver nitrate and pressure were used to achieve hemostasis.  Overall the patient tolerated the procedure well.    Laboratory & Radiologic Studies: None new  Assessment & Plan: AJAYLA IGLESIAS is a 73 y.o. woman with discrepancy between Pap and vaginal biopsy. 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 HR HPV testing at same time was negative. Colposcopy performed at recent visit with Dr. Pricilla Holm on 05/11/2023.    Due to significant changes in vulvar exam findings at her visit with Dr. Pricilla Holm, new ulcerative lesion on the left vulva was biopsied today. This was performed today instead of her recent visit on 05/11/2023 in order for her to have arrangements for someone to drive her home. Peri care discussed. She will be contacted with the results. Reportable signs and symptoms reviewed.    20 minutes of total time was spent for this patient encounter, including preparation, face-to-face counseling with the patient and coordination of care, and documentation of the encounter.  Warner Mccreedy NP [facility] Oncology

## 2023-05-25 NOTE — Patient Instructions (Signed)
 Today, we performed a vulvar biopsy. You may expect light spotting from this area. For the next 3-5 days, you can rinse the area after toileting and pat dry. Monitor for any new symptoms in this area such as increased tenderness, redness, pus-like drainage, feeling poorly and call the office at [phone].  You will be contacted when the results return.   Please call the office for any needs or concerns.

## 2023-05-26 ENCOUNTER — Telehealth: Payer: Self-pay | Admitting: *Deleted

## 2023-05-26 ENCOUNTER — Other Ambulatory Visit: Payer: Self-pay | Admitting: Gynecologic Oncology

## 2023-05-26 ENCOUNTER — Encounter: Payer: Self-pay | Admitting: *Deleted

## 2023-05-26 ENCOUNTER — Telehealth: Payer: Self-pay | Admitting: Gynecologic Oncology

## 2023-05-26 DIAGNOSIS — C519 Malignant neoplasm of vulva, unspecified: Secondary | ICD-10-CM

## 2023-05-26 NOTE — Telephone Encounter (Signed)
 Called patient to check in and discuss biopsy results. She states she was sore yesterday from the biopsy but better today. Minimal spotting reported.   Discussed pathology results from vulvar biopsy: A. LEFT VULVA, LESION, BIOPSY:  Invasive moderately differentiated squamous cell carcinoma   Advised patient of the plan to proceed with a PET scan to evaluate for signs of metastatic disease and for treatment planning. Order has been placed. Patient will be contacted with an appt for the PET scan and next steps. No concerns voiced at this time. She states she has been through breast cancer treatments and her husband had cancer so she is familiar with the scans etc. Advised to having nothing to eat or drink 6 hours before PET scan. Dr. Pricilla Holm will be notified of results of biopsy as well so additional steps can be scheduled.

## 2023-05-26 NOTE — Telephone Encounter (Signed)
 Spoke with patient and relayed that we have scheduled her Pet Scan for Monday, March 10 th at [facility] for 10:30 with arrival time of 10:15. Pt instructed to have nothing solid to eat after midnight on March 9 th and only water up until her appt. On 3/10 at 10:30. Pt verbalized understanding and thanked the office for calling.  MyChart message was also sent to patient with instructions.

## 2023-05-30 DIAGNOSIS — N9089 Other specified noninflammatory disorders of vulva and perineum: Secondary | ICD-10-CM | POA: Insufficient documentation

## 2023-05-31 ENCOUNTER — Encounter: Payer: Self-pay | Admitting: Oncology

## 2023-05-31 NOTE — Progress Notes (Signed)
 Requested depth of invasion on accession [id number] with [facility] via email.

## 2023-06-01 ENCOUNTER — Telehealth: Payer: Self-pay | Admitting: *Deleted

## 2023-06-01 LAB — SURGICAL PATHOLOGY

## 2023-06-01 NOTE — Telephone Encounter (Signed)
 Per Dr Pricilla Holm fax records to [facility]

## 2023-06-05 ENCOUNTER — Other Ambulatory Visit: Payer: Self-pay | Admitting: Gynecologic Oncology

## 2023-06-05 ENCOUNTER — Ambulatory Visit (HOSPITAL_COMMUNITY)
Admission: RE | Admit: 2023-06-05 | Discharge: 2023-06-05 | Disposition: A | Discharge status: Home / Self Care | Payer: Medicare Other | Source: Ambulatory Visit | Attending: Gynecologic Oncology | Admitting: Gynecologic Oncology

## 2023-06-05 DIAGNOSIS — R102 Pelvic and perineal pain: Secondary | ICD-10-CM

## 2023-06-05 DIAGNOSIS — C774 Secondary and unspecified malignant neoplasm of inguinal and lower limb lymph nodes: Secondary | ICD-10-CM | POA: Insufficient documentation

## 2023-06-05 DIAGNOSIS — R911 Solitary pulmonary nodule: Secondary | ICD-10-CM | POA: Diagnosis not present

## 2023-06-05 DIAGNOSIS — K449 Diaphragmatic hernia without obstruction or gangrene: Secondary | ICD-10-CM | POA: Diagnosis not present

## 2023-06-05 DIAGNOSIS — I700 Atherosclerosis of aorta: Secondary | ICD-10-CM | POA: Diagnosis not present

## 2023-06-05 DIAGNOSIS — C519 Malignant neoplasm of vulva, unspecified: Secondary | ICD-10-CM | POA: Diagnosis not present

## 2023-06-05 LAB — GLUCOSE, CAPILLARY: Glucose-Capillary: 104 mg/dL — ABNORMAL HIGH (ref 70–99)

## 2023-06-05 MED ORDER — LIDOCAINE 5 % EX OINT: 1.0000 | TOPICAL_OINTMENT | Freq: Two times a day (BID) | CUTANEOUS | 3 refills | Status: DC | PRN

## 2023-06-05 MED ORDER — FLUDEOXYGLUCOSE F - 18 (FDG) INJECTION
10.7000 | Freq: Once | INTRAVENOUS | Status: AC
  Administered 2023-06-05: 10.7 via INTRAVENOUS

## 2023-06-05 NOTE — Progress Notes (Signed)
 Pt called requesting prescription for lidocaine for topical use to the cancer on the vulva.

## 2023-06-07 ENCOUNTER — Telehealth: Payer: Self-pay | Admitting: Oncology

## 2023-06-07 ENCOUNTER — Encounter (HOSPITAL_COMMUNITY): Payer: Self-pay | Admitting: Gynecologic Oncology

## 2023-06-07 NOTE — Telephone Encounter (Signed)
 Left a message regarding surgery scheduled for 06/14/23.  Requested a return call.

## 2023-06-07 NOTE — Telephone Encounter (Signed)
 Doris Reyes called and was advised that we are holding surgery time for 06/14/23 with Dr. Pricilla Holm and that pre op from [facility] will be calling her for a pre op appointment.  Also let her know that Dr. Pricilla Holm will talk with her about the surgery tomorrow at her appointment.

## 2023-06-08 ENCOUNTER — Inpatient Hospital Stay: Admitting: Gynecologic Oncology

## 2023-06-08 ENCOUNTER — Telehealth: Payer: Self-pay | Admitting: *Deleted

## 2023-06-08 ENCOUNTER — Inpatient Hospital Stay: Attending: Oncology | Admitting: Gynecologic Oncology

## 2023-06-08 ENCOUNTER — Encounter: Payer: Self-pay | Admitting: Oncology

## 2023-06-08 VITALS — BP 136/65 | HR 60 | Temp 98.8°F | Resp 20 | Wt 216.0 lb

## 2023-06-08 DIAGNOSIS — C519 Malignant neoplasm of vulva, unspecified: Secondary | ICD-10-CM | POA: Diagnosis not present

## 2023-06-08 DIAGNOSIS — C50111 Malignant neoplasm of central portion of right female breast: Secondary | ICD-10-CM | POA: Diagnosis not present

## 2023-06-08 DIAGNOSIS — Z7189 Other specified counseling: Secondary | ICD-10-CM | POA: Diagnosis not present

## 2023-06-08 DIAGNOSIS — Z8619 Personal history of other infectious and parasitic diseases: Secondary | ICD-10-CM

## 2023-06-08 DIAGNOSIS — C774 Secondary and unspecified malignant neoplasm of inguinal and lower limb lymph nodes: Secondary | ICD-10-CM | POA: Insufficient documentation

## 2023-06-08 NOTE — Patient Instructions (Addendum)
 Preparing for your Surgery  Plan for surgery on June 28, 2023 with Dr. Eugene Garnet at [facility]. You will be scheduled for partial left modified radical vulvectomy, possible skin flap, bilateral inguinal lymph node dissection.   We will plan for an overnight stay in the hospital.   Pre-operative Testing -You will receive a phone call from presurgical testing at [facility] to arrange for a pre-operative appointment and lab work.  -Bring your insurance card, copy of an advanced directive if applicable, medication list  -At that visit, you will be asked to sign a consent for a possible blood transfusion in case a transfusion becomes necessary during surgery.  The need for a blood transfusion is rare but having consent is a necessary part of your care.     -You should not be taking blood thinners or aspirin at least ten days prior to surgery unless instructed by your surgeon.  -Do not take supplements such as fish oil (omega 3), red yeast rice, turmeric before your surgery. STOP TAKING AT LEAST 10 DAYS BEFORE SURGERY. You want to avoid medications with aspirin in them including headache powders such as BC or Goody's), Excedrin migraine.  Day Before Surgery at Home -You will be advised you can have clear liquids up until 3 hours before your surgery.    Your role in recovery Your role is to become active as soon as directed by your doctor, while still giving yourself time to heal.  Rest when you feel tired. You will be asked to do the following in order to speed your recovery:  - Cough and breathe deeply. This helps to clear and expand your lungs and can prevent pneumonia after surgery.  - STAY ACTIVE WHEN YOU GET HOME. Do mild physical activity. Walking or moving your legs help your circulation and body functions return to normal. Do not try to get up or walk alone the first time after surgery.   -If you develop swelling on one leg or the other, pain in the back of your  leg, redness/warmth in one of your legs, please call the office or go to the Emergency Room to have a doppler to rule out a blood clot. For shortness of breath, chest pain-seek care in the Emergency Room as soon as possible. - Actively manage your pain. Managing your pain lets you move in comfort. We will ask you to rate your pain on a scale of zero to 10. It is your responsibility to tell your doctor or nurse where and how much you hurt so your pain can be treated.  Special Considerations -If you are diabetic, you may be placed on insulin after surgery to have closer control over your blood sugars to promote healing and recovery.  This does not mean that you will be discharged on insulin.  If applicable, your oral antidiabetics will be resumed when you are tolerating a solid diet.  -Your final pathology results from surgery should be available around one week after surgery and the results will be relayed to you when available.  -Dr. Antionette Char is the surgeon that assists your GYN Oncologist with surgery.  If you end up staying the night, the next day after your surgery you will either see Dr. Pricilla Holm, Dr. Alvester Morin, or Dr. Antionette Char.  -FMLA forms can be faxed to [phone] and please allow 5-7 business days for completion.  Pain Management After Surgery -You will be prescribed your pain medication and bowel regimen medications before surgery so  that you can have these available when you are discharged from the hospital. The pain medication is for use ONLY AFTER surgery and a new prescription will not be given.   -Make sure that you have Tylenol and Ibuprofen IF YOU ARE ABLE TO TAKE THESE MEDICATIONS at home to use on a regular basis after surgery for pain control. We recommend alternating the medications every hour to six hours since they work differently and are processed in the body differently for pain relief.  -Review the attached handout on narcotic use and their risks and side  effects.   Bowel Regimen -You will be prescribed Sennakot-S to take nightly to prevent constipation especially if you are taking the narcotic pain medication intermittently.  It is important to prevent constipation and drink adequate amounts of liquids. You can stop taking this medication when you are not taking pain medication and you are back on your normal bowel routine.  Risks of Surgery Risks of surgery are low but include bleeding, infection, damage to surrounding structures, re-operation, blood clots, and very rarely death.   Blood Transfusion Information (For the consent to be signed before surgery)  We will be checking your blood type before surgery so in case of emergencies, we will know what type of blood you would need.                                            WHAT IS A BLOOD TRANSFUSION?  A transfusion is the replacement of blood or some of its parts. Blood is made up of multiple cells which provide different functions. Red blood cells carry oxygen and are used for blood loss replacement. White blood cells fight against infection. Platelets control bleeding. Plasma helps clot blood. Other blood products are available for specialized needs, such as hemophilia or other clotting disorders. BEFORE THE TRANSFUSION  Who gives blood for transfusions?  You may be able to donate blood to be used at a later date on yourself (autologous donation). Relatives can be asked to donate blood. This is generally not any safer than if you have received blood from a stranger. The same precautions are taken to ensure safety when a relative's blood is donated. Healthy volunteers who are fully evaluated to make sure their blood is safe. This is blood bank blood. Transfusion therapy is the safest it has ever been in the practice of medicine. Before blood is taken from a donor, a complete history is taken to make sure that person has no history of diseases nor engages in risky social behavior  (examples are intravenous drug use or sexual activity with multiple partners). The donor's travel history is screened to minimize risk of transmitting infections, such as malaria. The donated blood is tested for signs of infectious diseases, such as HIV and hepatitis. The blood is then tested to be sure it is compatible with you in order to minimize the chance of a transfusion reaction. If you or a relative donates blood, this is often done in anticipation of surgery and is not appropriate for emergency situations. It takes many days to process the donated blood. RISKS AND COMPLICATIONS Although transfusion therapy is very safe and saves many lives, the main dangers of transfusion include:  Getting an infectious disease. Developing a transfusion reaction. This is an allergic reaction to something in the blood you were given. Every precaution is taken to  prevent this. The decision to have a blood transfusion has been considered carefully by your caregiver before blood is given. Blood is not given unless the benefits outweigh the risks.  AFTER SURGERY INSTRUCTIONS  Return to work: 4-6 weeks if applicable  We recommend purchasing several bags of frozen green peas and dividing them into ziploc bags. You will want to keep these in the freezer and have them ready to use as ice packs to the vulvar incision. Once the ice pack is no longer cold, you can get another from the freezer. The frozen peas mold to your body better than a regular ice pack.   Activity: 1. Be up and out of the bed during the day.  Take a nap if needed.  You may walk up steps but be careful and use the hand rail.  Stair climbing will tire you more than you think, you may need to stop part way and rest.   2. No lifting or straining for 6 weeks over 10 pounds. No pushing, pulling, straining for 6 weeks.  3. No driving for 0-98 days when the following criteria have been met: Do not drive if you are taking narcotic pain medicine and make  sure that your reaction time has returned.   4. You can shower as soon as the next day after surgery. Shower daily.  Use your regular soap and water (not directly on the incision) and pat your incision(s) dry afterwards; don't rub.  No tub baths or submerging your body in water until cleared by your surgeon. If you have the soap that was given to you by pre-surgical testing that was used before surgery, you do not need to use it afterwards because this can irritate your incisions.   5. No sexual activity and nothing in the vagina for 4-6 weeks.  6. You may experience a small amount of clear drainage from your incisions, which is normal.  If the drainage persists, increases, or changes color please call the office.  7. Do not use creams, lotions, or ointments such as neosporin on your incisions after surgery until advised by your surgeon because they can cause removal of the dermabond glue on your incisions.    8. You may experience vulvar spotting after surgery or when the stitches at the top of the vulvar begin to dissolve.  The spotting is normal but if you experience heavy bleeding, call our office.  9. Take Tylenol or ibuprofen first for pain if you are able to take these medications and only use narcotic pain medication for severe pain not relieved by the Tylenol or Ibuprofen.  Monitor your Tylenol intake to a max of 4,000 mg in a 24 hour period. You can alternate these medications after surgery.  Diet: 1. Low sodium Heart Healthy Diet is recommended but you are cleared to resume your normal (before surgery) diet after your procedure.  2. It is safe to use a laxative, such as Miralax or Colace, if you have difficulty moving your bowels before surgery. You have been prescribed Sennakot-S to take at bedtime every evening after surgery to keep bowel movements regular and to prevent constipation.    Wound Care: 1. Keep clean and dry.  Shower daily.  Reasons to call the Doctor: Fever - Oral  temperature greater than 100.4 degrees Fahrenheit Foul-smelling vaginal discharge Difficulty urinating Nausea and vomiting Increased pain at the site of the incision that is unrelieved with pain medicine. Difficulty breathing with or without chest pain New calf pain  especially if only on one side Sudden, continuing increased vaginal bleeding with or without clots.   Contacts: For questions or concerns you should contact:  Dr. Eugene Garnet at [phone]  Warner Mccreedy, NP at [phone]  After Hours: call [phone] and have the GYN Oncologist paged/contacted (after 5 pm or on the weekends). You will speak with an after hours RN and let he or she know you have had surgery.  Messages sent via mychart are for non-urgent matters and are not responded to after hours so for urgent needs, please call the after hours number.

## 2023-06-08 NOTE — Progress Notes (Signed)
 Patient here for a pre-operative appointment prior to her scheduled surgery on 06/28/2023. She is scheduled for a partial left modified radical vulvectomy, possible skin flap, bilateral inguinal lymph node dissection. The surgery was discussed in detail.  See after visit summary for additional details. Visual aids used to discuss items related to surgery including JP drain.      Discussed post-op pain management in detail including the aspects of the enhanced recovery pathway.  Advised her that a new prescription would be sent in and it is only to be used for after her upcoming surgery.  We discussed the use of tylenol post-op and to monitor for a maximum of 4,000 mg in a 24 hour period.  Also prescribed sennakot to be used after surgery and to hold if having loose stools.  Discussed bowel regimen in detail.     Discussed the use of SCDs and measures to take at home to prevent DVT including frequent mobility.  Reportable signs and symptoms of DVT discussed. Post-operative instructions discussed and expectations for after surgery. Incisional care discussed as well including reportable signs and symptoms including erythema, drainage, wound separation.     30 minutes spent with the patient.  Verbalizing understanding of material discussed. No needs or concerns voiced at the end of the visit.   Advised patient to call for any needs.  Advised that her post-operative medications had been prescribed and could be picked up at any time.    This appointment is included in the global surgical bundle as pre-operative teaching and has no charge.

## 2023-06-08 NOTE — Telephone Encounter (Signed)
 Patient awae od 4/9 apptr

## 2023-06-08 NOTE — Progress Notes (Signed)
 Requested PD-L1 on accession [id number] with [facility] per Dr. Pricilla Holm.

## 2023-06-09 ENCOUNTER — Telehealth: Payer: Self-pay

## 2023-06-09 MED ORDER — SENNOSIDES-DOCUSATE SODIUM 8.6-50 MG PO TABS: 2.0000 | ORAL_TABLET | Freq: Every day | ORAL | 0 refills | Status: AC

## 2023-06-09 MED ORDER — OXYCODONE HCL 5 MG PO TABS: 5.0000 mg | ORAL_TABLET | ORAL | 0 refills | Status: DC | PRN

## 2023-06-09 NOTE — Addendum Note (Signed)
 Addended by: Warner Mccreedy D on: 06/09/2023 10:19 AM   Modules accepted: Orders

## 2023-06-09 NOTE — Telephone Encounter (Signed)
 Surgical optimization form for scheduled surgery on 4/2 faxed to Dr.William Tiburcio Pea (PCP) at [phone]

## 2023-06-11 ENCOUNTER — Encounter: Payer: Self-pay | Admitting: Gynecologic Oncology

## 2023-06-12 NOTE — Telephone Encounter (Signed)
 Surgical optimization form received from PCP Dr.Harris.

## 2023-06-13 ENCOUNTER — Telehealth: Payer: Self-pay | Admitting: Oncology

## 2023-06-13 NOTE — Telephone Encounter (Signed)
 Left a message regarding possible surgery on 06/20/23 at [facility].  Requested a return call.  Also attempted to call again with no answer.

## 2023-06-13 NOTE — Progress Notes (Unsigned)
 Gynecologic Oncology Return Clinic Visit  06/13/23  Reason for Visit: treatment planning  Treatment History: Oncology History  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)  06/08/2021 Initial Diagnosis   Breast cancer in female [name])   07/23/2021 Genetic Testing   Negative hereditary cancer genetic testing: no pathogenic variants detected in [facility] BRCAPlus Panel. Report date is July 23, 2021.  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  The report date is Jul 26, 2021.  The BRCAplus panel offered by [employer] [facility] and includes sequencing and deletion/duplication analysis for the following 8 genes: ATM, BRCA1, BRCA2, CDH1, CHEK2, PALB2, PTEN, and TP53.  Results of pan-cancer panel pending.   The CancerNext-Expanded gene panel offered by [facility] and includes sequencing and rearrangement analysis for the following 77 genes: AIP, ALK, APC*, ATM*, AXIN2, BAP1, BARD1, BLM, BMPR1A, BRCA1*, BRCA2*, BRIP1*, CDC73, CDH1*, CDK4, CDKN1B, CDKN2A, CHEK2*, CTNNA1, DICER1, FANCC, FH, FLCN, GALNT12, KIF1B, LZTR1, MAX, MEN1, MET, MLH1*, MSH2*, MSH3, MSH6*, MUTYH*, NBN, NF1*, NF2, NTHL1, PALB2*, PHOX2B, PMS2*, POT1, PRKAR1A, PTCH1, PTEN*, RAD51C*, RAD51D*, RB1, RECQL, RET, SDHA, SDHAF2, SDHB, SDHC, SDHD, SMAD4, SMARCA4, SMARCB1, SMARCE1, STK11, SUFU, TMEM127, TP53*, TSC1, TSC2, VHL and XRCC2 (sequencing and deletion/duplication); EGFR, EGLN1, HOXB13, KIT, MITF, PDGFRA, POLD1, and POLE (sequencing only); EPCAM and GREM1 (deletion/duplication only). DNA and RNA analyses performed for * genes.    08/23/2021 Cancer Staging   Staging form: Breast, AJCC 8th Edition - Pathologic stage from 08/23/2021: Stage IA (pT2(2), pN0(sn), cM0, G1, ER+, PR+, HER2-) - Signed by Dellia Beckwith, MD on 09/29/2021 Histopathologic type: Lobular carcinoma, NOS Stage prefix: Initial  diagnosis Method of lymph node assessment: Sentinel lymph node biopsy Nuclear grade: G1 Multigene prognostic tests performed: EndoPredict Histologic grading system: 3 grade system Residual tumor (R): R0 - None Laterality: Right Tumor size (mm): 24 Multiple tumors: Yes Number of tumors: 2 Lymph-vascular invasion (LVI): LVI not present (absent)/not identified Diagnostic confirmation: Positive histology PLUS positive immunophenotyping and/or positive genetic studies Specimen type: Excision Staged by: Managing physician Menopausal status: Postmenopausal Ki-67 (%): 5 Stage used in treatment planning: Yes National guidelines used in treatment planning: Yes Type of national guideline used in treatment planning: NCCN    The patient endorses having occasional abnormal Pap smears prior to her hysterectomy in 2011 for fibroids. She denies ever having cervical biopsies or procedures done, thinks that Pap smears were just repeated. After her hysterectomy, she had a period of time where she did not get follow-up. Since establishing with her OB/GYN provider, she has been getting Pap smears which were initially normal. Her more recent Pap history is noted below.   09/21/20: ASCUS pap 09/27/2021: ASC-H pap 11/03/21: Vaginoscopy. 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 Biopsy shows squamous mucosa with mild koilocytic atypia c/w LSIL lesion.  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 Reports new area within the vagina, some pruritus and pain. 1 cm area of ulceration noted along the left mid vulva with tenderness on palpation. Biopsy recommended - she preferred to come back when she had a driver.  Biopsy performed on 05/25/23, revealed grade 2 SCC, DOI at least 1.25 mm.    06/05/23: PET shows hypermetabolic left inguinal lymph nodes measuring up  to 1.8 cm. No other hypermetabolism noted.   Interval History: Doing well. Continues to have itching and pain along left vulva.   Past Medical/Surgical History: Past Medical History:  Diagnosis Date   Anemia    Anxiety    Cancer (HCC)    right breast ILC   Complication of anesthesia    difficulty waking up   Depression    Diverticulitis    Family history of breast cancer    Family history of ovarian cancer    Family history of prostate cancer    Fibromyalgia    GERD (gastroesophageal reflux disease)    History of colonic polyps    Hyperlipidemia    Hypertension    IBS (irritable bowel syndrome)    Ischemic colitis (HCC) 03/2012   Osteoporosis    Pre-diabetes    RLS (restless legs syndrome)    Sinusitis    Sleep apnea    CPAP nightly    Past Surgical History:  Procedure Laterality Date   BREAST LUMPECTOMY WITH RADIOACTIVE SEED LOCALIZATION Right 08/04/2021   Procedure: RIGHT BREAST LUMPECTOMY WITH RADIOACTIVE SEED LOCALIZATION;  Surgeon: Harriette Bouillon, MD;  Location: [facility] OR;  Service: General;  Laterality: Right;   CHOLECYSTECTOMY  2004   COLONOSCOPY WITH PROPOFOL N/A 04/18/2017   Procedure: COLONOSCOPY WITH PROPOFOL;  Surgeon: Charna Elizabeth, MD;  Location: [facility];  Service: Endoscopy;  Laterality: N/A;   ESOPHAGOGASTRODUODENOSCOPY (EGD) WITH PROPOFOL N/A 04/18/2017   Procedure: ESOPHAGOGASTRODUODENOSCOPY (EGD) WITH PROPOFOL;  Surgeon: Charna Elizabeth, MD;  Location: [facility];  Service: Endoscopy;  Laterality: N/A;   FLEXIBLE SIGMOIDOSCOPY  04/20/2012   Procedure: FLEXIBLE SIGMOIDOSCOPY;  Surgeon: Theda Belfast, MD;  Location: [facility];  Service: Endoscopy;  Laterality: N/A;   NISSEN FUNDOPLICATION  2011   PARAESOPHAGEAL HERNIA REPAIR  09/11/2009   and Nissen fundoplication   RE-EXCISION OF BREAST LUMPECTOMY Right 08/18/2021   Procedure: RE-EXCISION RIGHT BREAST LUMPECTOMY;   Surgeon: Harriette Bouillon, MD;  Location:  SURGERY CENTER;  Service: General;  Laterality: Right;   SENTINEL NODE BIOPSY N/A 08/04/2021   Procedure: SENTINEL NODE BIOPSY;  Surgeon: Harriette Bouillon, MD;  Location: [facility] OR;  Service: General;  Laterality: N/A;   TONSILLECTOMY  1960's   TOTAL ABDOMINAL HYSTERECTOMY  2001   w/ BSO    Family History  Problem Relation Age of Onset   Colon cancer Mother 21   Alzheimer's disease Mother    Heart attack Father    Cancer Maternal Aunt        x2   Alzheimer's disease Maternal Aunt    Ovarian cancer Maternal Aunt    Prostate cancer Maternal Uncle    Kidney disease Maternal Uncle    Atrial fibrillation Maternal Uncle    Stroke Paternal Uncle    Aneurysm Paternal Grandmother        brain   Stroke Paternal Grandfather    Prostate cancer Cousin        paternal first cousin   Prostate cancer Cousin        paternal first cousin   Esophageal cancer Cousin        maternal first cousin   Breast cancer Cousin        DCIS, maternal first cousin   Ovarian cancer Cousin        maternal first cousin   Rheum arthritis Other    Lung disease Neg Hx     Social History   Socioeconomic History   Marital status: Widowed    Spouse  name: Fayrene Fearing   Number of children: 1   Years of education: Not on file   Highest education level: Some college, no degree  Occupational History   Occupation: disabled  Tobacco Use   Smoking status: Never   Smokeless tobacco: Never   Tobacco comments:    brief exposure through her husband  Vaping Use   Vaping status: Never Used  Substance and Sexual Activity   Alcohol use: Never    Alcohol/week: 0.0 standard drinks of alcohol   Drug use: Never   Sexual activity: Not Currently  Other Topics Concern   Not on file  Social History Narrative   From [place] originally. Previously lived in [place]. Previously worked at [employer] also in a [employer]. Has also worked as a Diplomatic Services operational officer for [employer]. No pets  currently. No bird exposure. No known mold in her current home.    Lives with husband   Caffeine- coffee 2 c daily   Social Drivers of Health   Financial Resource Strain: Low Risk  (02/01/2022)   Overall Financial Resource Strain (CARDIA)    Difficulty of Paying Living Expenses: Not hard at all  Food Insecurity: No Food Insecurity (02/01/2022)   Hunger Vital Sign    Worried About Running Out of Food in the Last Year: Never true    Ran Out of Food in the Last Year: Never true  Transportation Needs: No Transportation Needs (02/01/2022)   PRAPARE - Administrator, Civil Service (Medical): No    Lack of Transportation (Non-Medical): No  Physical Activity: Not on file  Stress: Not on file  Social Connections: Not on file    Current Medications:  Current Outpatient Medications:    acetaminophen (TYLENOL) 650 MG CR tablet, 1,300 mg in the morning and at bedtime., Disp: , Rfl:    ALPRAZolam (XANAX) 0.25 MG tablet, Take 0.25 mg by mouth at bedtime as needed for anxiety., Disp: , Rfl:    amLODipine (NORVASC) 5 MG tablet, Take 5 mg by mouth daily., Disp: , Rfl:    Ascorbic Acid (VITAMIN C) 500 MG CHEW, Chew 500 mg by mouth daily., Disp: , Rfl:    aspirin EC 81 MG tablet, Take 81 mg by mouth daily. Swallow whole., Disp: , Rfl:    atenolol (TENORMIN) 50 MG tablet, Take 75 mg by mouth daily. , Disp: , Rfl:    benazepril (LOTENSIN) 10 MG tablet, Take 10 mg by mouth daily., Disp: , Rfl:    benzonatate (TESSALON) 100 MG capsule, Take 100-200 mg by mouth at bedtime., Disp: , Rfl:    Biotin 1000 MCG tablet, Take 1,000 mcg by mouth daily., Disp: , Rfl:    Calcium Citrate-Vitamin D (CITRACAL + D PO), Take 1 tablet by mouth daily., Disp: , Rfl:    cetirizine (ZYRTEC) 10 MG tablet, Take 10 mg by mouth daily as needed for allergies. , Disp: , Rfl:    Cholecalciferol (VITAMIN D) 1000 UNITS capsule, Take 2,000 Units by mouth daily. , Disp: , Rfl:    cyanocobalamin 1000 MCG tablet, Take 1,000 mcg  by mouth daily., Disp: , Rfl:    diclofenac sodium (VOLTAREN) 1 % GEL, Apply 1 application topically daily as needed (for pain). Apply as directed, Disp: , Rfl:    dicyclomine (BENTYL) 10 MG capsule, Take 10 mg by mouth 3 (three) times daily as needed for spasms., Disp: , Rfl:    diphenhydrAMINE (BENADRYL) 25 MG tablet, Take 25 mg by mouth every 6 (six) hours  as needed for allergies., Disp: , Rfl:    diphenoxylate-atropine (LOMOTIL) 2.5-0.025 MG tablet, Take 2 tablets by mouth 4 (four) times daily as needed for diarrhea or loose stools., Disp: , Rfl:    doxycycline (VIBRA-TABS) 100 MG tablet, Take 100 mg by mouth 2 (two) times daily., Disp: , Rfl:    DULoxetine (CYMBALTA) 60 MG capsule, Take 120 mg by mouth daily. , Disp: , Rfl:    EPINEPHrine (EPIPEN 2-PAK) 0.3 mg/0.3 mL IJ SOAJ injection, See admin instructions., Disp: , Rfl:    estradiol (ESTRACE VAGINAL) 0.1 MG/GM vaginal cream, Place 1 Applicatorful vaginally 3 (three) times a week., Disp: 42.5 g, Rfl: 12   famotidine (PEPCID) 20 MG tablet, Take 20 mg by mouth at bedtime., Disp: , Rfl:    fexofenadine (ALLEGRA) 180 MG tablet, Take 180 mg by mouth daily., Disp: , Rfl:    fluticasone (FLONASE) 50 MCG/ACT nasal spray, Place 1 spray into both nostrils daily., Disp: , Rfl:    lidocaine (XYLOCAINE) 5 % ointment, Apply 1 Application topically 2 (two) times daily as needed for moderate pain (pain score 4-6) (to the vulva). Apply to vulva as needed for discomfort, Disp: 35.44 g, Rfl: 3   Nutritional Supplements (JUICE PLUS FIBRE PO), Take 2 each by mouth daily. Fruits and Vegetables, Disp: , Rfl:    Omega-3 Fatty Acids (FISH OIL) 1000 MG CAPS, Take 1,000 mg by mouth daily. , Disp: , Rfl:    ondansetron (ZOFRAN) 4 MG tablet, Take 4 mg by mouth every 8 (eight) hours as needed for nausea or vomiting., Disp: , Rfl:    oxyCODONE (OXY IR/ROXICODONE) 5 MG immediate release tablet, Take 1 tablet (5 mg total) by mouth every 4 (four) hours as needed for severe  pain (pain score 7-10). For AFTER surgery only, do not take and drive, Disp: 10 tablet, Rfl: 0   pantoprazole (PROTONIX) 40 MG tablet, Take 40 mg by mouth daily. , Disp: , Rfl:    Phenylephrine-APAP-Guaifenesin (TYLENOL SINUS SEVERE PO), Take 2 tablets by mouth daily as needed (for congestion)., Disp: , Rfl:    polyvinyl alcohol (LIQUIFILM TEARS) 1.4 % ophthalmic solution, Place 1 drop into both eyes as needed for dry eyes., Disp: , Rfl:    Probiotic Product (PROBIOTIC & ACIDOPHILUS EX ST PO), Take 1 capsule by mouth daily. Ultra Flora Plus Capsules, Disp: , Rfl:    promethazine-dextromethorphan (PROMETHAZINE-DM) 6.25-15 MG/5ML syrup, Take 5 mLs by mouth every 6 (six) hours as needed., Disp: , Rfl:    Propylene Glycol (SYSTANE BALANCE) 0.6 % SOLN, Place 1 drop into both eyes daily., Disp: , Rfl:    rOPINIRole (REQUIP) 0.5 MG tablet, Take 0.5 mg by mouth at bedtime., Disp: , Rfl:    senna-docusate (SENOKOT-S) 8.6-50 MG tablet, Take 2 tablets by mouth at bedtime. For AFTER surgery, do not take if having diarrhea, Disp: 30 tablet, Rfl: 0   simvastatin (ZOCOR) 10 MG tablet, Take 10 mg by mouth at bedtime.  , Disp: , Rfl:    sucralfate (CARAFATE) 1 GM/10ML suspension, Take 1 g by mouth 4 (four) times daily -  with meals and at bedtime., Disp: , Rfl:    vitamin E 400 UNIT capsule, Take 400 Units by mouth daily.  , Disp: , Rfl:   Review of Systems: Denies appetite changes, fevers, chills, fatigue, unexplained weight changes. Denies hearing loss, neck lumps or masses, mouth sores, ringing in ears or voice changes. Denies cough or wheezing.  Denies shortness of breath. Denies chest  pain or palpitations. Denies leg swelling. Denies abdominal distention, pain, blood in stools, constipation, diarrhea, nausea, vomiting, or early satiety. Denies pain with intercourse, dysuria, frequency, hematuria or incontinence. Denies hot flashes, pelvic pain, vaginal bleeding or vaginal discharge.   Denies joint pain,  back pain or muscle pain/cramps. Denies itching, rash, or wounds. Denies dizziness, headaches, numbness or seizures. Denies swollen lymph nodes or glands, denies easy bruising or bleeding. Denies anxiety, depression, confusion, or decreased concentration.  Physical Exam: BP 136/65 (BP Location: Left Arm, Patient Position: Sitting)   Pulse 60   Temp 98.8 F (37.1 C) (Oral)   Resp 20   Wt 216 lb (98 kg)   SpO2 97%   BMI 38.26 kg/m  General: Alert, oriented, no acute distress. HEENT: Posterior oropharynx clear, sclera anicteric. Chest: Clear to auscultation bilaterally.  No wheezes or rhonchi. Cardiovascular: Regular rate and rhythm, no murmurs. Extremities: Grossly normal range of motion.  Warm, well perfused.  No edema bilaterally. Lymphatics: Palpable lymph node, deep, within left groin just lateral to pubic symphysis.  GU: 2 cm ulcerated left vulvar nodule at 3 o'clock.   Laboratory & Radiologic Studies: See above  Assessment & Plan: KENOSHA DOSTER is a 73 y.o. 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  Reviewed recent biopsy, exam findings, and PET findings. Official PET read not back at the time of this visit but on my review, there are two enlarged and FDG-avid nodes within the left groin, no obvious other metastatic disease.  We discussed recommendations for treatment. Given concerning nodal disease, I recommend resection of enlarged lymph nodes within the left groin followed by radiation with consideration of concurrent chemotherapy. We discussed the benefit and risk of contralateral inguinal LND disease. We discussed the risk of contralateral nodal disease and then lymph node dissection would potentially allow [place] to avoid radiation of the right groin. I reviewed the risk of inguinal lymph node dissection, the risks vs benefits of simply moving forward with radiation of the right groin versus LND. Ultimately, the patient  would like to avoid radiation to the right groin if possible. Thus, we will plan on right inguinal lymphadenectomy along with debulking of involved left inguinal lymph nodes.   From a vulvar lesion standpoint, we discussed plan for modified partial radical vulvectomy. Given its size and location, it appears to be amenable to primary resection without injury to the urethra. Based on size after resection, there may be benefit from a rotational flap (decision to be made at the time of surgery).  We reviewed the plan for a debulking of left inguinal lymph nodes, right inguinal lymphadenectomy, modified partial left radical vulvectomy, possible skin flap. The risks of surgery were discussed in detail and she understands these to include infection; wound separation; injury to adjacent organs such as bowel, bladder, blood vessels, ureters and nerves; bleeding which may require blood transfusion; anesthesia risk; thromboembolic events; possible death; unforeseen complications; possible need for re-exploration; medical complications such as heart attack, stroke, pleural effusion and pneumonia; and, if full lymphadenectomy is performed the risk of lymphedema and lymphocyst. The patient will receive DVT and antibiotic prophylaxis as indicated. She voiced a clear understanding. She had the opportunity to ask questions. Perioperative instructions were reviewed with her. Prescriptions for post-op medications were sent to her pharmacy of choice.  40 minutes of total time was spent for this patient encounter, including preparation, face-to-face counseling with the patient and coordination of care, and  documentation of the encounter.  Eugene Garnet, MD  Division of Gynecologic Oncology  Department of Obstetrics and Gynecology  [facility]

## 2023-06-13 NOTE — H&P (View-Only) (Signed)
 Gynecologic Oncology Return Clinic Visit  06/13/23  Reason for Visit: treatment planning  Treatment History: Oncology History  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)  06/08/2021 Initial Diagnosis   Breast cancer in female [name])   07/23/2021 Genetic Testing   Negative hereditary cancer genetic testing: no pathogenic variants detected in [facility] BRCAPlus Panel. Report date is July 23, 2021.  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  The report date is Jul 26, 2021.  The BRCAplus panel offered by [employer] [facility] and includes sequencing and deletion/duplication analysis for the following 8 genes: ATM, BRCA1, BRCA2, CDH1, CHEK2, PALB2, PTEN, and TP53.  Results of pan-cancer panel pending.   The CancerNext-Expanded gene panel offered by [facility] and includes sequencing and rearrangement analysis for the following 77 genes: AIP, ALK, APC*, ATM*, AXIN2, BAP1, BARD1, BLM, BMPR1A, BRCA1*, BRCA2*, BRIP1*, CDC73, CDH1*, CDK4, CDKN1B, CDKN2A, CHEK2*, CTNNA1, DICER1, FANCC, FH, FLCN, GALNT12, KIF1B, LZTR1, MAX, MEN1, MET, MLH1*, MSH2*, MSH3, MSH6*, MUTYH*, NBN, NF1*, NF2, NTHL1, PALB2*, PHOX2B, PMS2*, POT1, PRKAR1A, PTCH1, PTEN*, RAD51C*, RAD51D*, RB1, RECQL, RET, SDHA, SDHAF2, SDHB, SDHC, SDHD, SMAD4, SMARCA4, SMARCB1, SMARCE1, STK11, SUFU, TMEM127, TP53*, TSC1, TSC2, VHL and XRCC2 (sequencing and deletion/duplication); EGFR, EGLN1, HOXB13, KIT, MITF, PDGFRA, POLD1, and POLE (sequencing only); EPCAM and GREM1 (deletion/duplication only). DNA and RNA analyses performed for * genes.    08/23/2021 Cancer Staging   Staging form: Breast, AJCC 8th Edition - Pathologic stage from 08/23/2021: Stage IA (pT2(2), pN0(sn), cM0, G1, ER+, PR+, HER2-) - Signed by Dellia Beckwith, MD on 09/29/2021 Histopathologic type: Lobular carcinoma, NOS Stage prefix: Initial  diagnosis Method of lymph node assessment: Sentinel lymph node biopsy Nuclear grade: G1 Multigene prognostic tests performed: EndoPredict Histologic grading system: 3 grade system Residual tumor (R): R0 - None Laterality: Right Tumor size (mm): 24 Multiple tumors: Yes Number of tumors: 2 Lymph-vascular invasion (LVI): LVI not present (absent)/not identified Diagnostic confirmation: Positive histology PLUS positive immunophenotyping and/or positive genetic studies Specimen type: Excision Staged by: Managing physician Menopausal status: Postmenopausal Ki-67 (%): 5 Stage used in treatment planning: Yes National guidelines used in treatment planning: Yes Type of national guideline used in treatment planning: NCCN    The patient endorses having occasional abnormal Pap smears prior to her hysterectomy in 2011 for fibroids. She denies ever having cervical biopsies or procedures done, thinks that Pap smears were just repeated. After her hysterectomy, she had a period of time where she did not get follow-up. Since establishing with her OB/GYN provider, she has been getting Pap smears which were initially normal. Her more recent Pap history is noted below.   09/21/20: ASCUS pap 09/27/2021: ASC-H pap 11/03/21: Vaginoscopy. 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 Biopsy shows squamous mucosa with mild koilocytic atypia c/w LSIL lesion.  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 Reports new area within the vagina, some pruritus and pain. 1 cm area of ulceration noted along the left mid vulva with tenderness on palpation. Biopsy recommended - she preferred to come back when she had a driver.  Biopsy performed on 05/25/23, revealed grade 2 SCC, DOI at least 1.25 mm.    06/05/23: PET shows hypermetabolic left inguinal lymph nodes measuring up  to 1.8 cm. No other hypermetabolism noted.   Interval History: Doing well. Continues to have itching and pain along left vulva.   Past Medical/Surgical History: Past Medical History:  Diagnosis Date   Anemia    Anxiety    Cancer (HCC)    right breast ILC   Complication of anesthesia    difficulty waking up   Depression    Diverticulitis    Family history of breast cancer    Family history of ovarian cancer    Family history of prostate cancer    Fibromyalgia    GERD (gastroesophageal reflux disease)    History of colonic polyps    Hyperlipidemia    Hypertension    IBS (irritable bowel syndrome)    Ischemic colitis (HCC) 03/2012   Osteoporosis    Pre-diabetes    RLS (restless legs syndrome)    Sinusitis    Sleep apnea    CPAP nightly    Past Surgical History:  Procedure Laterality Date   BREAST LUMPECTOMY WITH RADIOACTIVE SEED LOCALIZATION Right 08/04/2021   Procedure: RIGHT BREAST LUMPECTOMY WITH RADIOACTIVE SEED LOCALIZATION;  Surgeon: Harriette Bouillon, MD;  Location: [facility] OR;  Service: General;  Laterality: Right;   CHOLECYSTECTOMY  2004   COLONOSCOPY WITH PROPOFOL N/A 04/18/2017   Procedure: COLONOSCOPY WITH PROPOFOL;  Surgeon: Charna Elizabeth, MD;  Location: [facility];  Service: Endoscopy;  Laterality: N/A;   ESOPHAGOGASTRODUODENOSCOPY (EGD) WITH PROPOFOL N/A 04/18/2017   Procedure: ESOPHAGOGASTRODUODENOSCOPY (EGD) WITH PROPOFOL;  Surgeon: Charna Elizabeth, MD;  Location: [facility];  Service: Endoscopy;  Laterality: N/A;   FLEXIBLE SIGMOIDOSCOPY  04/20/2012   Procedure: FLEXIBLE SIGMOIDOSCOPY;  Surgeon: Theda Belfast, MD;  Location: [facility];  Service: Endoscopy;  Laterality: N/A;   NISSEN FUNDOPLICATION  2011   PARAESOPHAGEAL HERNIA REPAIR  09/11/2009   and Nissen fundoplication   RE-EXCISION OF BREAST LUMPECTOMY Right 08/18/2021   Procedure: RE-EXCISION RIGHT BREAST LUMPECTOMY;   Surgeon: Harriette Bouillon, MD;  Location:  SURGERY CENTER;  Service: General;  Laterality: Right;   SENTINEL NODE BIOPSY N/A 08/04/2021   Procedure: SENTINEL NODE BIOPSY;  Surgeon: Harriette Bouillon, MD;  Location: [facility] OR;  Service: General;  Laterality: N/A;   TONSILLECTOMY  1960's   TOTAL ABDOMINAL HYSTERECTOMY  2001   w/ BSO    Family History  Problem Relation Age of Onset   Colon cancer Mother 21   Alzheimer's disease Mother    Heart attack Father    Cancer Maternal Aunt        x2   Alzheimer's disease Maternal Aunt    Ovarian cancer Maternal Aunt    Prostate cancer Maternal Uncle    Kidney disease Maternal Uncle    Atrial fibrillation Maternal Uncle    Stroke Paternal Uncle    Aneurysm Paternal Grandmother        brain   Stroke Paternal Grandfather    Prostate cancer Cousin        paternal first cousin   Prostate cancer Cousin        paternal first cousin   Esophageal cancer Cousin        maternal first cousin   Breast cancer Cousin        DCIS, maternal first cousin   Ovarian cancer Cousin        maternal first cousin   Rheum arthritis Other    Lung disease Neg Hx     Social History   Socioeconomic History   Marital status: Widowed    Spouse  name: Fayrene Fearing   Number of children: 1   Years of education: Not on file   Highest education level: Some college, no degree  Occupational History   Occupation: disabled  Tobacco Use   Smoking status: Never   Smokeless tobacco: Never   Tobacco comments:    brief exposure through her husband  Vaping Use   Vaping status: Never Used  Substance and Sexual Activity   Alcohol use: Never    Alcohol/week: 0.0 standard drinks of alcohol   Drug use: Never   Sexual activity: Not Currently  Other Topics Concern   Not on file  Social History Narrative   From [place] originally. Previously lived in [place]. Previously worked at [employer] also in a [employer]. Has also worked as a Diplomatic Services operational officer for [employer]. No pets  currently. No bird exposure. No known mold in her current home.    Lives with husband   Caffeine- coffee 2 c daily   Social Drivers of Health   Financial Resource Strain: Low Risk  (02/01/2022)   Overall Financial Resource Strain (CARDIA)    Difficulty of Paying Living Expenses: Not hard at all  Food Insecurity: No Food Insecurity (02/01/2022)   Hunger Vital Sign    Worried About Running Out of Food in the Last Year: Never true    Ran Out of Food in the Last Year: Never true  Transportation Needs: No Transportation Needs (02/01/2022)   PRAPARE - Administrator, Civil Service (Medical): No    Lack of Transportation (Non-Medical): No  Physical Activity: Not on file  Stress: Not on file  Social Connections: Not on file    Current Medications:  Current Outpatient Medications:    acetaminophen (TYLENOL) 650 MG CR tablet, 1,300 mg in the morning and at bedtime., Disp: , Rfl:    ALPRAZolam (XANAX) 0.25 MG tablet, Take 0.25 mg by mouth at bedtime as needed for anxiety., Disp: , Rfl:    amLODipine (NORVASC) 5 MG tablet, Take 5 mg by mouth daily., Disp: , Rfl:    Ascorbic Acid (VITAMIN C) 500 MG CHEW, Chew 500 mg by mouth daily., Disp: , Rfl:    aspirin EC 81 MG tablet, Take 81 mg by mouth daily. Swallow whole., Disp: , Rfl:    atenolol (TENORMIN) 50 MG tablet, Take 75 mg by mouth daily. , Disp: , Rfl:    benazepril (LOTENSIN) 10 MG tablet, Take 10 mg by mouth daily., Disp: , Rfl:    benzonatate (TESSALON) 100 MG capsule, Take 100-200 mg by mouth at bedtime., Disp: , Rfl:    Biotin 1000 MCG tablet, Take 1,000 mcg by mouth daily., Disp: , Rfl:    Calcium Citrate-Vitamin D (CITRACAL + D PO), Take 1 tablet by mouth daily., Disp: , Rfl:    cetirizine (ZYRTEC) 10 MG tablet, Take 10 mg by mouth daily as needed for allergies. , Disp: , Rfl:    Cholecalciferol (VITAMIN D) 1000 UNITS capsule, Take 2,000 Units by mouth daily. , Disp: , Rfl:    cyanocobalamin 1000 MCG tablet, Take 1,000 mcg  by mouth daily., Disp: , Rfl:    diclofenac sodium (VOLTAREN) 1 % GEL, Apply 1 application topically daily as needed (for pain). Apply as directed, Disp: , Rfl:    dicyclomine (BENTYL) 10 MG capsule, Take 10 mg by mouth 3 (three) times daily as needed for spasms., Disp: , Rfl:    diphenhydrAMINE (BENADRYL) 25 MG tablet, Take 25 mg by mouth every 6 (six) hours  as needed for allergies., Disp: , Rfl:    diphenoxylate-atropine (LOMOTIL) 2.5-0.025 MG tablet, Take 2 tablets by mouth 4 (four) times daily as needed for diarrhea or loose stools., Disp: , Rfl:    doxycycline (VIBRA-TABS) 100 MG tablet, Take 100 mg by mouth 2 (two) times daily., Disp: , Rfl:    DULoxetine (CYMBALTA) 60 MG capsule, Take 120 mg by mouth daily. , Disp: , Rfl:    EPINEPHrine (EPIPEN 2-PAK) 0.3 mg/0.3 mL IJ SOAJ injection, See admin instructions., Disp: , Rfl:    estradiol (ESTRACE VAGINAL) 0.1 MG/GM vaginal cream, Place 1 Applicatorful vaginally 3 (three) times a week., Disp: 42.5 g, Rfl: 12   famotidine (PEPCID) 20 MG tablet, Take 20 mg by mouth at bedtime., Disp: , Rfl:    fexofenadine (ALLEGRA) 180 MG tablet, Take 180 mg by mouth daily., Disp: , Rfl:    fluticasone (FLONASE) 50 MCG/ACT nasal spray, Place 1 spray into both nostrils daily., Disp: , Rfl:    lidocaine (XYLOCAINE) 5 % ointment, Apply 1 Application topically 2 (two) times daily as needed for moderate pain (pain score 4-6) (to the vulva). Apply to vulva as needed for discomfort, Disp: 35.44 g, Rfl: 3   Nutritional Supplements (JUICE PLUS FIBRE PO), Take 2 each by mouth daily. Fruits and Vegetables, Disp: , Rfl:    Omega-3 Fatty Acids (FISH OIL) 1000 MG CAPS, Take 1,000 mg by mouth daily. , Disp: , Rfl:    ondansetron (ZOFRAN) 4 MG tablet, Take 4 mg by mouth every 8 (eight) hours as needed for nausea or vomiting., Disp: , Rfl:    oxyCODONE (OXY IR/ROXICODONE) 5 MG immediate release tablet, Take 1 tablet (5 mg total) by mouth every 4 (four) hours as needed for severe  pain (pain score 7-10). For AFTER surgery only, do not take and drive, Disp: 10 tablet, Rfl: 0   pantoprazole (PROTONIX) 40 MG tablet, Take 40 mg by mouth daily. , Disp: , Rfl:    Phenylephrine-APAP-Guaifenesin (TYLENOL SINUS SEVERE PO), Take 2 tablets by mouth daily as needed (for congestion)., Disp: , Rfl:    polyvinyl alcohol (LIQUIFILM TEARS) 1.4 % ophthalmic solution, Place 1 drop into both eyes as needed for dry eyes., Disp: , Rfl:    Probiotic Product (PROBIOTIC & ACIDOPHILUS EX ST PO), Take 1 capsule by mouth daily. Ultra Flora Plus Capsules, Disp: , Rfl:    promethazine-dextromethorphan (PROMETHAZINE-DM) 6.25-15 MG/5ML syrup, Take 5 mLs by mouth every 6 (six) hours as needed., Disp: , Rfl:    Propylene Glycol (SYSTANE BALANCE) 0.6 % SOLN, Place 1 drop into both eyes daily., Disp: , Rfl:    rOPINIRole (REQUIP) 0.5 MG tablet, Take 0.5 mg by mouth at bedtime., Disp: , Rfl:    senna-docusate (SENOKOT-S) 8.6-50 MG tablet, Take 2 tablets by mouth at bedtime. For AFTER surgery, do not take if having diarrhea, Disp: 30 tablet, Rfl: 0   simvastatin (ZOCOR) 10 MG tablet, Take 10 mg by mouth at bedtime.  , Disp: , Rfl:    sucralfate (CARAFATE) 1 GM/10ML suspension, Take 1 g by mouth 4 (four) times daily -  with meals and at bedtime., Disp: , Rfl:    vitamin E 400 UNIT capsule, Take 400 Units by mouth daily.  , Disp: , Rfl:   Review of Systems: Denies appetite changes, fevers, chills, fatigue, unexplained weight changes. Denies hearing loss, neck lumps or masses, mouth sores, ringing in ears or voice changes. Denies cough or wheezing.  Denies shortness of breath. Denies chest  pain or palpitations. Denies leg swelling. Denies abdominal distention, pain, blood in stools, constipation, diarrhea, nausea, vomiting, or early satiety. Denies pain with intercourse, dysuria, frequency, hematuria or incontinence. Denies hot flashes, pelvic pain, vaginal bleeding or vaginal discharge.   Denies joint pain,  back pain or muscle pain/cramps. Denies itching, rash, or wounds. Denies dizziness, headaches, numbness or seizures. Denies swollen lymph nodes or glands, denies easy bruising or bleeding. Denies anxiety, depression, confusion, or decreased concentration.  Physical Exam: BP 136/65 (BP Location: Left Arm, Patient Position: Sitting)   Pulse 60   Temp 98.8 F (37.1 C) (Oral)   Resp 20   Wt 216 lb (98 kg)   SpO2 97%   BMI 38.26 kg/m  General: Alert, oriented, no acute distress. HEENT: Posterior oropharynx clear, sclera anicteric. Chest: Clear to auscultation bilaterally.  No wheezes or rhonchi. Cardiovascular: Regular rate and rhythm, no murmurs. Extremities: Grossly normal range of motion.  Warm, well perfused.  No edema bilaterally. Lymphatics: Palpable lymph node, deep, within left groin just lateral to pubic symphysis.  GU: 2 cm ulcerated left vulvar nodule at 3 o'clock.   Laboratory & Radiologic Studies: See above  Assessment & Plan: Doris Reyes is a 73 y.o. 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  Reviewed recent biopsy, exam findings, and PET findings. Official PET read not back at the time of this visit but on my review, there are two enlarged and FDG-avid nodes within the left groin, no obvious other metastatic disease.  We discussed recommendations for treatment. Given concerning nodal disease, I recommend resection of enlarged lymph nodes within the left groin followed by radiation with consideration of concurrent chemotherapy. We discussed the benefit and risk of contralateral inguinal LND disease. We discussed the risk of contralateral nodal disease and then lymph node dissection would potentially allow [place] to avoid radiation of the right groin. I reviewed the risk of inguinal lymph node dissection, the risks vs benefits of simply moving forward with radiation of the right groin versus LND. Ultimately, the patient  would like to avoid radiation to the right groin if possible. Thus, we will plan on right inguinal lymphadenectomy along with debulking of involved left inguinal lymph nodes.   From a vulvar lesion standpoint, we discussed plan for modified partial radical vulvectomy. Given its size and location, it appears to be amenable to primary resection without injury to the urethra. Based on size after resection, there may be benefit from a rotational flap (decision to be made at the time of surgery).  We reviewed the plan for a debulking of left inguinal lymph nodes, right inguinal lymphadenectomy, modified partial left radical vulvectomy, possible skin flap. The risks of surgery were discussed in detail and she understands these to include infection; wound separation; injury to adjacent organs such as bowel, bladder, blood vessels, ureters and nerves; bleeding which may require blood transfusion; anesthesia risk; thromboembolic events; possible death; unforeseen complications; possible need for re-exploration; medical complications such as heart attack, stroke, pleural effusion and pneumonia; and, if full lymphadenectomy is performed the risk of lymphedema and lymphocyst. The patient will receive DVT and antibiotic prophylaxis as indicated. She voiced a clear understanding. She had the opportunity to ask questions. Perioperative instructions were reviewed with her. Prescriptions for post-op medications were sent to her pharmacy of choice.  40 minutes of total time was spent for this patient encounter, including preparation, face-to-face counseling with the patient and coordination of care, and  documentation of the encounter.  Eugene Garnet, MD  Division of Gynecologic Oncology  Department of Obstetrics and Gynecology  [facility]

## 2023-06-14 ENCOUNTER — Encounter: Payer: Self-pay | Admitting: Gynecologic Oncology

## 2023-06-14 NOTE — Telephone Encounter (Signed)
 Doris Reyes called back and wants to keep her surgery on 06/28/23.  She had to arrange for family to bring her for the surgery so she is unable to move the date.

## 2023-06-19 ENCOUNTER — Other Ambulatory Visit: Payer: Self-pay | Admitting: Oncology

## 2023-06-19 NOTE — Progress Notes (Signed)
 Gynecologic Oncology Multi-Disciplinary Disposition Conference Note  Date of the Conference: 06/19/2023  Patient Name: Doris Reyes  Primary GYN Oncologist: Dr. 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 Disposition is to surgery with right inguinal lymphadenectomy along with debulking of involved left inguinal lymph nodes.    This Multidisciplinary conference took place involving physicians from Gynecologic Oncology, Medical Oncology, Radiation Oncology, Pathology, Radiology along with the Gynecologic Oncology Nurse Practitioner and Gynecologic Oncology Nurse Navigator.  Comprehensive assessment of the patient's malignancy, staging, need for surgery, chemotherapy, radiation therapy, and need for further testing were reviewed. Supportive measures, both inpatient and following discharge were also discussed. The recommended plan of care is documented. Greater than 35 minutes were spent correlating and coordinating this patient's care.

## 2023-06-20 LAB — MOLECULAR PATHOLOGY

## 2023-06-21 NOTE — Progress Notes (Signed)
 COVID Vaccine received:  []  No [x]  Yes Date of any COVID positive Test in last 90 days: no PCP - Johny Blamer MD Cardiologist - Dr Allyson Sabal  Chest x-ray -  EKG -  06/22/23 Epic Stress Test -  ECHO - 09/02/16 Epic Cardiac Cath -   Bowel Prep - [x]  No  []   Yes ______  Pacemaker / ICD device [x]  No []  Yes   Spinal Cord Stimulator:[x]  No []  Yes       History of Sleep Apnea? []  No [x]  Yes   CPAP used?- []  No [x]  Yes    Does the patient monitor blood sugar?          [x]  No []  Yes  []  N/A  Patient has: [x]  NO Hx DM   []  Pre-DM                 []  DM1  []   DM2 Does patient have a [employer] or Dexacom? []  No []  Yes   Fasting Blood Sugar Ranges-  Checks Blood Sugar _____ times a day  GLP1 agonist / usual dose - no GLP1 instructions:  SGLT-2 inhibitors / usual dose - no SGLT-2 instructions:   Blood Thinner / Instructions:no Aspirin Instructions:81 mg ASA last dose to be 06/27/23  Comments:   Activity level: Patient is able to climb a flight of stairs without difficulty; [x]  No CP  [x]  No SOB,Patient can /  perform ADLs without assistance.   Anesthesia review: HTN, OSA, reports murmur when young.  doctors say "its nothing to worry about."  Patient denies shortness of breath, fever, cough and chest pain at PAT appointment.  Patient verbalized understanding and agreement to the Pre-Surgical Instructions that were given to them at this PAT appointment. Patient was also educated of the need to review these PAT instructions again prior to his/her surgery.I reviewed the appropriate phone numbers to call if they have any and questions or concerns.

## 2023-06-21 NOTE — Patient Instructions (Signed)
 SURGICAL WAITING ROOM VISITATION  Patients having surgery or a procedure may have no more than 2 support people in the waiting area - these visitors may rotate.    Children under the age of 36 must have an adult with them who is not the patient.  Due to an increase in RSV and influenza rates and associated hospitalizations, children ages 70 and under may not visit patients in [facility] hospitals.  Visitors with respiratory illnesses are discouraged from visiting and should remain at home.  If the patient needs to stay at the hospital during part of their recovery, the visitor guidelines for inpatient rooms apply. Pre-op nurse will coordinate an appropriate time for 1 support person to accompany patient in pre-op.  This support person may not rotate.    Please refer to the [facility] website for the visitor guidelines for Inpatients (after your surgery is over and you are in a regular room).       Your procedure is scheduled on: 06/28/23   Report to [facility] Main Entrance    Report to admitting at 9:45 AM   Call this number if you have problems the morning of surgery [phone]   Do not eat food :After Midnight.   After Midnight you may have the following liquids until 9 AM DAY OF SURGERY  Water Non-Citrus Juices (without pulp, NO RED-Apple, White grape, White cranberry) Black Coffee (NO MILK/CREAM OR CREAMERS, sugar ok)  Clear Tea (NO MILK/CREAM OR CREAMERS, sugar ok) regular and decaf                             Plain Jell-O (NO RED)                                           Fruit ices (not with fruit pulp, NO RED)                                     Popsicles (NO RED)                                                               Sports drinks like Gatorade (NO RED)                  Oral Hygiene is also important to reduce your risk of infection.                                    Remember - BRUSH YOUR TEETH THE MORNING OF SURGERY WITH YOUR REGULAR  TOOTHPASTE    Stop all vitamins and herbal supplements 7 days before surgery.   Take these medicines the morning of surgery with A SIP OF WATER: Xanax, tylenol, amlodipine, atenolol, zyrtec, cymbalta, pepcid, allegra, pantoprazole, nasal spray  Bring CPAP mask and tubing day of surgery.  You may not have any metal on your body including hair pins, jewelry, and body piercing             Do not wear make-up, lotions, powders, perfumes/cologne, or deodorant  Do not wear nail polish including gel and S&S, artificial/acrylic nails, or any other type of covering on natural nails including finger and toenails. If you have artificial nails, gel coating, etc. that needs to be removed by a nail salon please have this removed prior to surgery or surgery may need to be canceled/ delayed if the surgeon/ anesthesia feels like they are unable to be safely monitored.   Do not shave  48 hours prior to surgery.    Do not bring valuables to the hospital. [facility] IS NOT             RESPONSIBLE   FOR VALUABLES.   Contacts, glasses, dentures or bridgework may not be worn into surgery.   Bring small overnight bag day of surgery.   DO NOT BRING YOUR HOME MEDICATIONS TO THE HOSPITAL. PHARMACY WILL DISPENSE MEDICATIONS LISTED ON YOUR MEDICATION LIST TO YOU DURING YOUR ADMISSION IN THE HOSPITAL!    Patients discharged on the day of surgery will not be allowed to drive home.  Someone NEEDS to stay with you for the first 24 hours after anesthesia.   Special Instructions: Bring a copy of your healthcare power of attorney and living will documents the day of surgery if you haven't scanned them before.              Please read over the following fact sheets you were given: IF YOU HAVE QUESTIONS ABOUT YOUR PRE-OP INSTRUCTIONS PLEASE CALL [phone] Doris Reyes   If you received a COVID test during your pre-op visit  it is requested that you wear a mask when out in public, stay away from  anyone that may not be feeling well and notify your surgeon if you develop symptoms. If you test positive for Covid or have been in contact with anyone that has tested positive in the last 10 days please notify you surgeon.    [facility] - Preparing for Surgery Before surgery, you can play an important role.  Because skin is not sterile, your skin needs to be as free of germs as possible.  You can reduce the number of germs on your skin by washing with CHG (chlorahexidine gluconate) soap before surgery.  CHG is an antiseptic cleaner which kills germs and bonds with the skin to continue killing germs even after washing. Please DO NOT use if you have an allergy to CHG or antibacterial soaps.  If your skin becomes reddened/irritated stop using the CHG and inform your nurse when you arrive at Short Stay. Do not shave (including legs and underarms) for at least 48 hours prior to the first CHG shower.  You may shave your face/neck.  Please follow these instructions carefully:  1.  Shower with CHG Soap the night before surgery and the  morning of surgery.  2.  If you choose to wash your hair, wash your hair first as usual with your normal  shampoo.  3.  After you shampoo, rinse your hair and body thoroughly to remove the shampoo.                             4.  Use CHG as you would any other liquid soap.  You can apply chg directly  to the skin and wash.  Gently with a scrungie or clean washcloth.  5.  Apply the CHG Soap to your body ONLY FROM THE NECK DOWN.   Do   not use on face/ open                           Wound or open sores. Avoid contact with eyes, ears mouth and   genitals (private parts).                       Wash face,  Genitals (private parts) with your normal soap.             6.  Wash thoroughly, paying special attention to the area where your    surgery  will be performed.  7.  Thoroughly rinse your body with warm water from the neck down.  8.  DO NOT shower/wash with your normal soap  after using and rinsing off the CHG Soap.                9.  Pat yourself dry with a clean towel.            10.  Wear clean pajamas.            11.  Place clean sheets on your bed the night of your first shower and do not  sleep with pets. Day of Surgery : Do not apply any lotions/deodorants the morning of surgery.  Please wear clean clothes to the hospital/surgery center.  FAILURE TO FOLLOW THESE INSTRUCTIONS MAY RESULT IN THE CANCELLATION OF YOUR SURGERY  PATIENT SIGNATURE_________________________________  NURSE SIGNATURE__________________________________  ________________________________________________________________________ WHAT IS A BLOOD TRANSFUSION? Blood Transfusion Information  A transfusion is the replacement of blood or some of its parts. Blood is made up of multiple cells which provide different functions. Red blood cells carry oxygen and are used for blood loss replacement. White blood cells fight against infection. Platelets control bleeding. Plasma helps clot blood. Other blood products are available for specialized needs, such as hemophilia or other clotting disorders. BEFORE THE TRANSFUSION  Who gives blood for transfusions?  Healthy volunteers who are fully evaluated to make sure their blood is safe. This is blood bank blood. Transfusion therapy is the safest it has ever been in the practice of medicine. Before blood is taken from a donor, a complete history is taken to make sure that person has no history of diseases nor engages in risky social behavior (examples are intravenous drug use or sexual activity with multiple partners). The donor's travel history is screened to minimize risk of transmitting infections, such as malaria. The donated blood is tested for signs of infectious diseases, such as HIV and hepatitis. The blood is then tested to be sure it is compatible with you in order to minimize the chance of a transfusion reaction. If you or a relative donates  blood, this is often done in anticipation of surgery and is not appropriate for emergency situations. It takes many days to process the donated blood. RISKS AND COMPLICATIONS Although transfusion therapy is very safe and saves many lives, the main dangers of transfusion include:  Getting an infectious disease. Developing a transfusion reaction. This is an allergic reaction to something in the blood you were given. Every precaution is taken to prevent this. The decision to have a blood transfusion has been considered carefully by your caregiver before blood is given. Blood is not  given unless the benefits outweigh the risks. AFTER THE TRANSFUSION Right after receiving a blood transfusion, you will usually feel much better and more energetic. This is especially true if your red blood cells have gotten low (anemic). The transfusion raises the level of the red blood cells which carry oxygen, and this usually causes an energy increase. The nurse administering the transfusion will monitor you carefully for complications. HOME CARE INSTRUCTIONS  No special instructions are needed after a transfusion. You may find your energy is better. Speak with your caregiver about any limitations on activity for underlying diseases you may have. SEEK MEDICAL CARE IF:  Your condition is not improving after your transfusion. You develop redness or irritation at the intravenous (IV) site. SEEK IMMEDIATE MEDICAL CARE IF:  Any of the following symptoms occur over the next 12 hours: Shaking chills. You have a temperature by mouth above 102 F (38.9 C), not controlled by medicine. Chest, back, or muscle pain. People around you feel you are not acting correctly or are confused. Shortness of breath or difficulty breathing. Dizziness and fainting. You get a rash or develop hives. You have a decrease in urine output. Your urine turns a dark color or changes to pink, red, or brown. Any of the following symptoms occur over  the next 10 days: You have a temperature by mouth above 102 F (38.9 C), not controlled by medicine. Shortness of breath. Weakness after normal activity. The white part of the eye turns yellow (jaundice). You have a decrease in the amount of urine or are urinating less often. Your urine turns a dark color or changes to pink, red, or brown.

## 2023-06-22 ENCOUNTER — Encounter (HOSPITAL_COMMUNITY): Payer: Self-pay

## 2023-06-22 ENCOUNTER — Encounter (HOSPITAL_COMMUNITY)
Admission: RE | Admit: 2023-06-22 | Discharge: 2023-06-22 | Disposition: A | Discharge status: Home / Self Care | Source: Ambulatory Visit | Attending: Gynecologic Oncology | Admitting: Gynecologic Oncology

## 2023-06-22 ENCOUNTER — Other Ambulatory Visit: Payer: Self-pay

## 2023-06-22 VITALS — BP 151/95 | HR 55 | Temp 98.5°F | Resp 18 | Ht 63.0 in | Wt 215.0 lb

## 2023-06-22 DIAGNOSIS — C519 Malignant neoplasm of vulva, unspecified: Secondary | ICD-10-CM | POA: Diagnosis not present

## 2023-06-22 DIAGNOSIS — Z01818 Encounter for other preprocedural examination: Secondary | ICD-10-CM | POA: Diagnosis not present

## 2023-06-22 DIAGNOSIS — I10 Essential (primary) hypertension: Secondary | ICD-10-CM | POA: Insufficient documentation

## 2023-06-22 HISTORY — DX: Cardiac murmur, unspecified: R01.1

## 2023-06-22 HISTORY — DX: Personal history of other diseases of the digestive system: Z87.19

## 2023-06-22 LAB — CBC
HCT: 43.1 % (ref 36.0–46.0)
Hemoglobin: 13.5 g/dL (ref 12.0–15.0)
MCH: 28.2 pg (ref 26.0–34.0)
MCHC: 31.3 g/dL (ref 30.0–36.0)
MCV: 90 fL (ref 80.0–100.0)
Platelets: 192 10*3/uL (ref 150–400)
RBC: 4.79 MIL/uL (ref 3.87–5.11)
RDW: 12.9 % (ref 11.5–15.5)
WBC: 5.7 10*3/uL (ref 4.0–10.5)
nRBC: 0 % (ref 0.0–0.2)

## 2023-06-22 LAB — COMPREHENSIVE METABOLIC PANEL WITH GFR
ALT: 24 U/L (ref 0–44)
AST: 21 U/L (ref 15–41)
Albumin: 4.3 g/dL (ref 3.5–5.0)
Alkaline Phosphatase: 56 U/L (ref 38–126)
Anion gap: 8 (ref 5–15)
BUN: 16 mg/dL (ref 8–23)
CO2: 26 mmol/L (ref 22–32)
Calcium: 9.5 mg/dL (ref 8.9–10.3)
Chloride: 104 mmol/L (ref 98–111)
Creatinine, Ser: 0.82 mg/dL (ref 0.44–1.00)
GFR, Estimated: >60 mL/min (ref >60)
Glucose, Bld: 106 mg/dL — ABNORMAL HIGH (ref 70–99)
Potassium: 4 mmol/L (ref 3.5–5.1)
Sodium: 138 mmol/L (ref 135–145)
Total Bilirubin: 0.5 mg/dL (ref 0.0–1.2)
Total Protein: 6.8 g/dL (ref 6.5–8.1)

## 2023-06-27 ENCOUNTER — Telehealth: Payer: Self-pay | Admitting: *Deleted

## 2023-06-27 NOTE — Telephone Encounter (Signed)
 Telephone call to check on pre-operative status.  Patient compliant with pre-operative instructions.  Reinforced nothing to eat after midnight. Clear liquids until 0845. Patient to arrive at 0945.  No questions or concerns voiced.  Instructed to call for any needs.

## 2023-06-28 ENCOUNTER — Other Ambulatory Visit: Payer: Self-pay

## 2023-06-28 ENCOUNTER — Inpatient Hospital Stay (HOSPITAL_COMMUNITY)
Admission: RE | Admit: 2023-06-28 | Discharge: 2023-06-30 | DRG: 746 | Disposition: A | Discharge status: Home Health | Attending: Gynecologic Oncology | Admitting: Gynecologic Oncology

## 2023-06-28 ENCOUNTER — Inpatient Hospital Stay (HOSPITAL_COMMUNITY): Admitting: Certified Registered Nurse Anesthetist

## 2023-06-28 ENCOUNTER — Encounter (HOSPITAL_COMMUNITY)
Admission: RE | Disposition: A | Discharge status: Home Health | Payer: Self-pay | Source: Home / Self Care | Attending: Gynecologic Oncology

## 2023-06-28 ENCOUNTER — Inpatient Hospital Stay (HOSPITAL_COMMUNITY): Payer: Self-pay | Admitting: Physician Assistant

## 2023-06-28 ENCOUNTER — Encounter (HOSPITAL_COMMUNITY): Payer: Self-pay | Admitting: Gynecologic Oncology

## 2023-06-28 DIAGNOSIS — I10 Essential (primary) hypertension: Secondary | ICD-10-CM

## 2023-06-28 DIAGNOSIS — Z8042 Family history of malignant neoplasm of prostate: Secondary | ICD-10-CM

## 2023-06-28 DIAGNOSIS — C519 Malignant neoplasm of vulva, unspecified: Secondary | ICD-10-CM

## 2023-06-28 DIAGNOSIS — C774 Secondary and unspecified malignant neoplasm of inguinal and lower limb lymph nodes: Secondary | ICD-10-CM | POA: Diagnosis present

## 2023-06-28 DIAGNOSIS — G4733 Obstructive sleep apnea (adult) (pediatric): Secondary | ICD-10-CM | POA: Diagnosis not present

## 2023-06-28 DIAGNOSIS — Z885 Allergy status to narcotic agent status: Secondary | ICD-10-CM | POA: Diagnosis not present

## 2023-06-28 DIAGNOSIS — M797 Fibromyalgia: Secondary | ICD-10-CM | POA: Diagnosis present

## 2023-06-28 DIAGNOSIS — Z823 Family history of stroke: Secondary | ICD-10-CM

## 2023-06-28 DIAGNOSIS — Z7982 Long term (current) use of aspirin: Secondary | ICD-10-CM

## 2023-06-28 DIAGNOSIS — F419 Anxiety disorder, unspecified: Secondary | ICD-10-CM | POA: Diagnosis present

## 2023-06-28 DIAGNOSIS — Z803 Family history of malignant neoplasm of breast: Secondary | ICD-10-CM

## 2023-06-28 DIAGNOSIS — Z9049 Acquired absence of other specified parts of digestive tract: Secondary | ICD-10-CM | POA: Diagnosis not present

## 2023-06-28 DIAGNOSIS — Z820 Family history of epilepsy and other diseases of the nervous system: Secondary | ICD-10-CM

## 2023-06-28 DIAGNOSIS — E785 Hyperlipidemia, unspecified: Secondary | ICD-10-CM | POA: Diagnosis present

## 2023-06-28 DIAGNOSIS — G4730 Sleep apnea, unspecified: Secondary | ICD-10-CM | POA: Diagnosis present

## 2023-06-28 DIAGNOSIS — Z853 Personal history of malignant neoplasm of breast: Secondary | ICD-10-CM

## 2023-06-28 DIAGNOSIS — R0982 Postnasal drip: Secondary | ICD-10-CM

## 2023-06-28 DIAGNOSIS — Z79899 Other long term (current) drug therapy: Secondary | ICD-10-CM

## 2023-06-28 DIAGNOSIS — Z860100 Personal history of colon polyps, unspecified: Secondary | ICD-10-CM | POA: Diagnosis not present

## 2023-06-28 DIAGNOSIS — Z8041 Family history of malignant neoplasm of ovary: Secondary | ICD-10-CM

## 2023-06-28 DIAGNOSIS — F32A Depression, unspecified: Secondary | ICD-10-CM | POA: Diagnosis present

## 2023-06-28 DIAGNOSIS — Z9079 Acquired absence of other genital organ(s): Secondary | ICD-10-CM | POA: Diagnosis not present

## 2023-06-28 DIAGNOSIS — D071 Carcinoma in situ of vulva: Secondary | ICD-10-CM | POA: Diagnosis not present

## 2023-06-28 DIAGNOSIS — Z800 Family history of malignant neoplasm of digestive organs: Secondary | ICD-10-CM | POA: Diagnosis not present

## 2023-06-28 DIAGNOSIS — Z90710 Acquired absence of both cervix and uterus: Secondary | ICD-10-CM

## 2023-06-28 DIAGNOSIS — G2581 Restless legs syndrome: Secondary | ICD-10-CM | POA: Diagnosis present

## 2023-06-28 DIAGNOSIS — J309 Allergic rhinitis, unspecified: Secondary | ICD-10-CM

## 2023-06-28 DIAGNOSIS — R7303 Prediabetes: Secondary | ICD-10-CM | POA: Diagnosis present

## 2023-06-28 DIAGNOSIS — Z888 Allergy status to other drugs, medicaments and biological substances status: Secondary | ICD-10-CM

## 2023-06-28 DIAGNOSIS — Z8249 Family history of ischemic heart disease and other diseases of the circulatory system: Secondary | ICD-10-CM | POA: Diagnosis not present

## 2023-06-28 DIAGNOSIS — Z90722 Acquired absence of ovaries, bilateral: Secondary | ICD-10-CM

## 2023-06-28 DIAGNOSIS — N3289 Other specified disorders of bladder: Secondary | ICD-10-CM | POA: Diagnosis not present

## 2023-06-28 DIAGNOSIS — K219 Gastro-esophageal reflux disease without esophagitis: Secondary | ICD-10-CM | POA: Diagnosis present

## 2023-06-28 HISTORY — DX: Prediabetes: R73.03

## 2023-06-28 HISTORY — DX: Anemia, unspecified: D64.9

## 2023-06-28 HISTORY — PX: RADICAL VULVECTOMY: SHX6584

## 2023-06-28 HISTORY — PX: INGUINAL LYMPHADENECTOMY: SHX6587

## 2023-06-28 HISTORY — PX: EXAM UNDER ANESTHESIA, PELVIC: SHX7461

## 2023-06-28 LAB — TYPE AND SCREEN
ABO/RH(D): O POS
Antibody Screen: NEGATIVE

## 2023-06-28 LAB — ABO/RH: ABO/RH(D): O POS

## 2023-06-28 SURGERY — EXAM UNDER ANESTHESIA, PELVIC
Anesthesia: General | Site: Vulva

## 2023-06-28 MED ORDER — CEFAZOLIN SODIUM-DEXTROSE 2-4 GM/100ML-% IV SOLN
2.0000 g | INTRAVENOUS | Status: AC
  Administered 2023-06-28: 2 g via INTRAVENOUS
  Filled 2023-06-28: qty 100

## 2023-06-28 MED ORDER — BUPIVACAINE LIPOSOME 1.3 % IJ SUSP
INTRAMUSCULAR | Status: AC
  Filled 2023-06-28: qty 20

## 2023-06-28 MED ORDER — ROPINIROLE HCL 1 MG PO TABS
0.5000 mg | ORAL_TABLET | Freq: Every day | ORAL | Status: DC
  Administered 2023-06-28 – 2023-06-29 (×2): 0.5 mg via ORAL
  Filled 2023-06-28 (×2): qty 1

## 2023-06-28 MED ORDER — SUCRALFATE 1 GM/10ML PO SUSP
1.0000 g | Freq: Two times a day (BID) | ORAL | Status: DC
Start: 2023-06-28 — End: 2023-06-30
  Administered 2023-06-28 – 2023-06-30 (×4): 1 g via ORAL
  Filled 2023-06-28 (×4): qty 10

## 2023-06-28 MED ORDER — ROCURONIUM BROMIDE 10 MG/ML (PF) SYRINGE
PREFILLED_SYRINGE | INTRAVENOUS | Status: DC | PRN
  Administered 2023-06-28: 10 mg via INTRAVENOUS
  Administered 2023-06-28: 50 mg via INTRAVENOUS
  Administered 2023-06-28: 10 mg via INTRAVENOUS

## 2023-06-28 MED ORDER — SIMVASTATIN 20 MG PO TABS
10.0000 mg | ORAL_TABLET | Freq: Every day | ORAL | Status: DC
Start: 2023-06-28 — End: 2023-06-30
  Administered 2023-06-28 – 2023-06-29 (×2): 10 mg via ORAL
  Filled 2023-06-28 (×2): qty 1

## 2023-06-28 MED ORDER — FENTANYL CITRATE (PF) 100 MCG/2ML IJ SOLN
INTRAMUSCULAR | Status: AC
  Filled 2023-06-28: qty 2

## 2023-06-28 MED ORDER — DEXAMETHASONE SODIUM PHOSPHATE 10 MG/ML IJ SOLN
INTRAMUSCULAR | Status: AC
  Filled 2023-06-28: qty 1

## 2023-06-28 MED ORDER — LIDOCAINE HCL (PF) 2 % IJ SOLN
INTRAMUSCULAR | Status: DC | PRN
  Administered 2023-06-28: 100 mg via INTRADERMAL

## 2023-06-28 MED ORDER — IBUPROFEN 400 MG PO TABS
600.0000 mg | ORAL_TABLET | Freq: Four times a day (QID) | ORAL | Status: DC
  Filled 2023-06-28 (×4): qty 1

## 2023-06-28 MED ORDER — OXYCODONE HCL 5 MG PO TABS
5.0000 mg | ORAL_TABLET | Freq: Once | ORAL | Status: AC | PRN
  Administered 2023-06-28: 5 mg via ORAL

## 2023-06-28 MED ORDER — ARTIFICIAL TEARS OPHTHALMIC OINT
TOPICAL_OINTMENT | Freq: Every day | OPHTHALMIC | Status: DC
  Filled 2023-06-28: qty 3.5

## 2023-06-28 MED ORDER — ONDANSETRON HCL 4 MG/2ML IJ SOLN
4.0000 mg | Freq: Four times a day (QID) | INTRAMUSCULAR | Status: DC | PRN
  Filled 2023-06-28: qty 2

## 2023-06-28 MED ORDER — LACTATED RINGERS IV SOLN: INTRAVENOUS | Status: DC

## 2023-06-28 MED ORDER — CHLORHEXIDINE GLUCONATE 0.12 % MT SOLN
15.0000 mL | Freq: Once | OROMUCOSAL | Status: AC
  Administered 2023-06-28: 15 mL via OROMUCOSAL

## 2023-06-28 MED ORDER — HYDROMORPHONE HCL 1 MG/ML IJ SOLN: 0.5000 mg | INTRAMUSCULAR | Status: DC | PRN

## 2023-06-28 MED ORDER — BUPIVACAINE HCL (PF) 0.25 % IJ SOLN
INTRAMUSCULAR | Status: AC
  Filled 2023-06-28: qty 30

## 2023-06-28 MED ORDER — BUPIVACAINE-EPINEPHRINE (PF) 0.25% -1:200000 IJ SOLN
INTRAMUSCULAR | Status: AC
  Filled 2023-06-28: qty 30

## 2023-06-28 MED ORDER — ONDANSETRON HCL 4 MG/2ML IJ SOLN
INTRAMUSCULAR | Status: DC | PRN
  Administered 2023-06-28: 4 mg via INTRAVENOUS

## 2023-06-28 MED ORDER — TRAZODONE HCL 50 MG PO TABS
50.0000 mg | ORAL_TABLET | Freq: Every day | ORAL | Status: DC
  Administered 2023-06-28 – 2023-06-29 (×2): 50 mg via ORAL
  Filled 2023-06-28 (×2): qty 1

## 2023-06-28 MED ORDER — ATENOLOL 25 MG PO TABS
75.0000 mg | ORAL_TABLET | Freq: Every day | ORAL | Status: DC
  Administered 2023-06-28: 25 mg via ORAL
  Administered 2023-06-29: 75 mg via ORAL
  Filled 2023-06-28 (×2): qty 3

## 2023-06-28 MED ORDER — PROPOFOL 10 MG/ML IV BOLUS
INTRAVENOUS | Status: DC | PRN
  Administered 2023-06-28: 150 mg via INTRAVENOUS

## 2023-06-28 MED ORDER — ALPRAZOLAM 0.25 MG PO TABS
0.2500 mg | ORAL_TABLET | Freq: Every evening | ORAL | Status: DC | PRN
Start: 2023-06-28 — End: 2023-06-30
  Administered 2023-06-29 (×2): 0.25 mg via ORAL
  Filled 2023-06-28 (×3): qty 1

## 2023-06-28 MED ORDER — MIDAZOLAM HCL 5 MG/5ML IJ SOLN
INTRAMUSCULAR | Status: DC | PRN
  Administered 2023-06-28: 2 mg via INTRAVENOUS

## 2023-06-28 MED ORDER — PANTOPRAZOLE SODIUM 40 MG PO TBEC
40.0000 mg | DELAYED_RELEASE_TABLET | Freq: Every evening | ORAL | Status: DC
  Administered 2023-06-28 – 2023-06-29 (×2): 40 mg via ORAL
  Filled 2023-06-28 (×2): qty 1

## 2023-06-28 MED ORDER — EPHEDRINE SULFATE-NACL 50-0.9 MG/10ML-% IV SOSY
PREFILLED_SYRINGE | INTRAVENOUS | Status: DC | PRN
  Administered 2023-06-28: 5 mg via INTRAVENOUS
  Administered 2023-06-28 (×2): 10 mg via INTRAVENOUS

## 2023-06-28 MED ORDER — SENNOSIDES-DOCUSATE SODIUM 8.6-50 MG PO TABS
2.0000 | ORAL_TABLET | Freq: Every day | ORAL | Status: DC
  Administered 2023-06-28 – 2023-06-29 (×2): 2 via ORAL
  Filled 2023-06-28 (×2): qty 2

## 2023-06-28 MED ORDER — BUPIVACAINE LIPOSOME 1.3 % IJ SUSP
INTRAMUSCULAR | Status: DC | PRN
Start: 2023-06-28 — End: 2023-06-28
  Administered 2023-06-28: 7.5 mL
  Administered 2023-06-28: 12.5 mL

## 2023-06-28 MED ORDER — DROPERIDOL 2.5 MG/ML IJ SOLN: 0.6250 mg | Freq: Once | INTRAMUSCULAR | Status: DC | PRN

## 2023-06-28 MED ORDER — DEXAMETHASONE SODIUM PHOSPHATE 4 MG/ML IJ SOLN
4.0000 mg | INTRAMUSCULAR | Status: AC
  Administered 2023-06-28: 5 mg via INTRAVENOUS

## 2023-06-28 MED ORDER — FENTANYL CITRATE (PF) 250 MCG/5ML IJ SOLN
INTRAMUSCULAR | Status: AC
  Filled 2023-06-28: qty 5

## 2023-06-28 MED ORDER — FLUTICASONE PROPIONATE 50 MCG/ACT NA SUSP
1.0000 | Freq: Every day | NASAL | Status: DC
  Administered 2023-06-29 – 2023-06-30 (×2): 1 via NASAL
  Filled 2023-06-28: qty 16

## 2023-06-28 MED ORDER — HEPARIN SODIUM (PORCINE) 5000 UNIT/ML IJ SOLN
5000.0000 [IU] | INTRAMUSCULAR | Status: AC
  Administered 2023-06-28: 5000 [IU] via SUBCUTANEOUS
  Filled 2023-06-28: qty 1

## 2023-06-28 MED ORDER — SUGAMMADEX SODIUM 200 MG/2ML IV SOLN
INTRAVENOUS | Status: DC | PRN
  Administered 2023-06-28: 200 mg via INTRAVENOUS

## 2023-06-28 MED ORDER — PHENYLEPHRINE 80 MCG/ML (10ML) SYRINGE FOR IV PUSH (FOR BLOOD PRESSURE SUPPORT)
PREFILLED_SYRINGE | INTRAVENOUS | Status: DC | PRN
  Administered 2023-06-28: 120 ug via INTRAVENOUS
  Administered 2023-06-28 (×3): 80 ug via INTRAVENOUS

## 2023-06-28 MED ORDER — LACTATED RINGERS IV SOLN: INTRAVENOUS | Status: DC | PRN

## 2023-06-28 MED ORDER — BENAZEPRIL HCL 5 MG PO TABS
10.0000 mg | ORAL_TABLET | Freq: Every day | ORAL | Status: DC
  Administered 2023-06-29 – 2023-06-30 (×2): 10 mg via ORAL
  Filled 2023-06-28 (×2): qty 2

## 2023-06-28 MED ORDER — ACETIC ACID 5 % SOLN
Status: AC
  Filled 2023-06-28: qty 50

## 2023-06-28 MED ORDER — LIDOCAINE HCL (PF) 2 % IJ SOLN
INTRAMUSCULAR | Status: AC
  Filled 2023-06-28: qty 5

## 2023-06-28 MED ORDER — BUPIVACAINE HCL 0.25 % IJ SOLN
INTRAMUSCULAR | Status: DC | PRN
  Administered 2023-06-28: 12.5 mL
  Administered 2023-06-28: 7.5 mL

## 2023-06-28 MED ORDER — ONDANSETRON HCL 4 MG PO TABS
4.0000 mg | ORAL_TABLET | Freq: Four times a day (QID) | ORAL | Status: DC | PRN
Start: 2023-06-28 — End: 2023-06-30

## 2023-06-28 MED ORDER — ACETAMINOPHEN 500 MG PO TABS
1000.0000 mg | ORAL_TABLET | Freq: Two times a day (BID) | ORAL | Status: DC
Start: 2023-06-28 — End: 2023-06-30
  Administered 2023-06-28 – 2023-06-30 (×3): 1000 mg via ORAL
  Filled 2023-06-28 (×3): qty 2

## 2023-06-28 MED ORDER — SODIUM CHLORIDE 0.9% FLUSH: 3.0000 mL | INTRAVENOUS | Status: DC | PRN

## 2023-06-28 MED ORDER — ONDANSETRON HCL 4 MG/2ML IJ SOLN
INTRAMUSCULAR | Status: AC
  Filled 2023-06-28: qty 2

## 2023-06-28 MED ORDER — FAMOTIDINE 20 MG PO TABS
20.0000 mg | ORAL_TABLET | Freq: Every day | ORAL | Status: DC
Start: 2023-06-28 — End: 2023-06-30
  Administered 2023-06-28 – 2023-06-29 (×2): 20 mg via ORAL
  Filled 2023-06-28 (×2): qty 1

## 2023-06-28 MED ORDER — OXYCODONE HCL 5 MG PO TABS
5.0000 mg | ORAL_TABLET | ORAL | Status: DC | PRN
  Administered 2023-06-29 – 2023-06-30 (×5): 5 mg via ORAL
  Filled 2023-06-28 (×5): qty 1

## 2023-06-28 MED ORDER — DULOXETINE HCL 60 MG PO CPEP
120.0000 mg | ORAL_CAPSULE | Freq: Every day | ORAL | Status: DC
Start: 2023-06-28 — End: 2023-06-30
  Administered 2023-06-28 – 2023-06-29 (×2): 120 mg via ORAL
  Filled 2023-06-28 (×2): qty 2

## 2023-06-28 MED ORDER — OXYCODONE HCL 5 MG PO TABS
ORAL_TABLET | ORAL | Status: AC
  Filled 2023-06-28: qty 1

## 2023-06-28 MED ORDER — ROCURONIUM BROMIDE 10 MG/ML (PF) SYRINGE
PREFILLED_SYRINGE | INTRAVENOUS | Status: AC
  Filled 2023-06-28: qty 10

## 2023-06-28 MED ORDER — 0.9 % SODIUM CHLORIDE (POUR BTL) OPTIME
TOPICAL | Status: DC | PRN
  Administered 2023-06-28: 1000 mL

## 2023-06-28 MED ORDER — LIDOCAINE HCL (PF) 1 % IJ SOLN
INTRAMUSCULAR | Status: AC
  Filled 2023-06-28: qty 30

## 2023-06-28 MED ORDER — CALCIUM CHLORIDE 10 % IV SOLN
INTRAVENOUS | Status: AC
  Filled 2023-06-28: qty 10

## 2023-06-28 MED ORDER — PHENYLEPHRINE 80 MCG/ML (10ML) SYRINGE FOR IV PUSH (FOR BLOOD PRESSURE SUPPORT)
PREFILLED_SYRINGE | INTRAVENOUS | Status: AC
  Filled 2023-06-28: qty 10

## 2023-06-28 MED ORDER — ORAL CARE MOUTH RINSE: 15.0000 mL | Freq: Once | OROMUCOSAL | Status: AC

## 2023-06-28 MED ORDER — OXYCODONE HCL 5 MG/5ML PO SOLN: 5.0000 mg | Freq: Once | ORAL | Status: AC | PRN

## 2023-06-28 MED ORDER — POVIDONE-IODINE 10 % EX SWAB: 2.0000 | Freq: Once | CUTANEOUS | Status: DC

## 2023-06-28 MED ORDER — REFRESH P.M. OP OINT: TOPICAL_OINTMENT | Freq: Every day | OPHTHALMIC | Status: DC

## 2023-06-28 MED ORDER — SODIUM CHLORIDE 0.9% FLUSH
3.0000 mL | Freq: Two times a day (BID) | INTRAVENOUS | Status: DC
Start: 2023-06-28 — End: 2023-06-30
  Administered 2023-06-28: 10 mL via INTRAVENOUS
  Administered 2023-06-29: 3 mL via INTRAVENOUS
  Administered 2023-06-29: 10 mL via INTRAVENOUS

## 2023-06-28 MED ORDER — AMLODIPINE BESYLATE 5 MG PO TABS
5.0000 mg | ORAL_TABLET | Freq: Every day | ORAL | Status: DC
  Administered 2023-06-29: 5 mg via ORAL
  Filled 2023-06-28 (×2): qty 1

## 2023-06-28 MED ORDER — EPHEDRINE 5 MG/ML INJ
INTRAVENOUS | Status: AC
  Filled 2023-06-28: qty 5

## 2023-06-28 MED ORDER — FENTANYL CITRATE (PF) 100 MCG/2ML IJ SOLN
INTRAMUSCULAR | Status: DC | PRN
  Administered 2023-06-28 (×4): 50 ug via INTRAVENOUS
  Administered 2023-06-28: 25 ug via INTRAVENOUS
  Administered 2023-06-28: 50 ug via INTRAVENOUS
  Administered 2023-06-28: 75 ug via INTRAVENOUS

## 2023-06-28 MED ORDER — HYDROMORPHONE HCL 1 MG/ML IJ SOLN: 0.2500 mg | INTRAMUSCULAR | Status: DC | PRN

## 2023-06-28 MED ORDER — MIDAZOLAM HCL 2 MG/2ML IJ SOLN
INTRAMUSCULAR | Status: AC
  Filled 2023-06-28: qty 2

## 2023-06-28 MED ORDER — ACETAMINOPHEN 500 MG PO TABS
1000.0000 mg | ORAL_TABLET | ORAL | Status: DC
  Filled 2023-06-28: qty 2

## 2023-06-28 SURGICAL SUPPLY — 70 items
APPLIER CLIP 13 LRG OPEN (CLIP) ×3 IMPLANT
BAG COUNTER SPONGE SURGICOUNT (BAG) IMPLANT
BLADE SURG 15 STRL LF DISP TIS (BLADE) ×3 IMPLANT
BNDG GAUZE DERMACEA FLUFF 4 (GAUZE/BANDAGES/DRESSINGS) IMPLANT
CHLORAPREP W/TINT 26 (MISCELLANEOUS) ×3 IMPLANT
CLIP APPLIE 13 LRG OPEN (CLIP) IMPLANT
CLIP TI LARGE 6 (CLIP) IMPLANT
CLIP TI MEDIUM 6 (CLIP) ×3 IMPLANT
CLIP TI MEDIUM LARGE 6 (CLIP) IMPLANT
CNTNR URN SCR LID CUP LEK RST (MISCELLANEOUS) IMPLANT
COVER SURGICAL LIGHT HANDLE (MISCELLANEOUS) ×3 IMPLANT
DERMABOND ADVANCED .7 DNX12 (GAUZE/BANDAGES/DRESSINGS) ×3 IMPLANT
DRAIN CHANNEL 10F 3/8 F FF (DRAIN) IMPLANT
DRAPE HYSTEROSCOPY (MISCELLANEOUS) ×3 IMPLANT
DRAPE LAPAROTOMY T 98X78 PEDS (DRAPES) ×3 IMPLANT
DRAPE SHEET LG 3/4 BI-LAMINATE (DRAPES) ×6 IMPLANT
DRAPE UTILITY XL STRL (DRAPES) IMPLANT
DRSG TEGADERM 4X4.75 (GAUZE/BANDAGES/DRESSINGS) IMPLANT
DRSG TELFA 3X8 NADH STRL (GAUZE/BANDAGES/DRESSINGS) ×3 IMPLANT
ELECT REM PT RETURN 15FT ADLT (MISCELLANEOUS) ×3 IMPLANT
EVACUATOR SILICONE 100CC (DRAIN) IMPLANT
GAUZE 4X4 16PLY ~~LOC~~+RFID DBL (SPONGE) ×3 IMPLANT
GAUZE SPONGE 4X4 12PLY STRL (GAUZE/BANDAGES/DRESSINGS) ×3 IMPLANT
GLOVE BIO SURGEON STRL SZ 6 (GLOVE) ×6 IMPLANT
GLOVE BIO SURGEON STRL SZ 6.5 (GLOVE) ×3 IMPLANT
GOWN STRL REUS W/ TWL LRG LVL3 (GOWN DISPOSABLE) ×6 IMPLANT
KIT BASIN OR (CUSTOM PROCEDURE TRAY) ×3 IMPLANT
KIT TURNOVER KIT A (KITS) IMPLANT
LEGGING LITHOTOMY PAIR STRL (DRAPES) ×3 IMPLANT
NDL HYPO 21X1.5 SAFETY (NEEDLE) ×3 IMPLANT
NDL HYPO 22X1.5 SAFETY MO (MISCELLANEOUS) ×3 IMPLANT
NDL SPNL 22GX7 QUINCKE BK (NEEDLE) IMPLANT
NEEDLE HYPO 21X1.5 SAFETY (NEEDLE) ×3 IMPLANT
NEEDLE HYPO 22X1.5 SAFETY MO (MISCELLANEOUS) ×3 IMPLANT
NEEDLE SPNL 22GX7 QUINCKE BK (NEEDLE) IMPLANT
NS IRRIG 1000ML POUR BTL (IV SOLUTION) ×3 IMPLANT
PACK GENERAL/GYN (CUSTOM PROCEDURE TRAY) ×3 IMPLANT
PACK LITHOTOMY IV (CUSTOM PROCEDURE TRAY) ×3 IMPLANT
PAD MAGNETIC INSTR ST 16X20 (MISCELLANEOUS) IMPLANT
PENCIL SMOKE EVACUATOR (MISCELLANEOUS) IMPLANT
SCOPETTES 8 STERILE (MISCELLANEOUS) IMPLANT
SCRUB CHG 4% DYNA-HEX 4OZ (MISCELLANEOUS) IMPLANT
SHEARS HARMONIC 9CM CVD (BLADE) IMPLANT
SHEET LAVH (DRAPES) IMPLANT
SOL PREP POV-IOD 4OZ 10% (MISCELLANEOUS) ×3 IMPLANT
SPIKE FLUID TRANSFER (MISCELLANEOUS) ×3 IMPLANT
SPONGE DRAIN TRACH 4X4 STRL 2S (GAUZE/BANDAGES/DRESSINGS) IMPLANT
SURGILUBE 2OZ TUBE FLIPTOP (MISCELLANEOUS) ×3 IMPLANT
SUT ETHILON 3 0 PS 1 (SUTURE) IMPLANT
SUT MNCRL AB 4-0 PS2 18 (SUTURE) ×3 IMPLANT
SUT SILK 2-0 18XBRD TIE 12 (SUTURE) ×3 IMPLANT
SUT SILK 3-0 18XBRD TIE 12 (SUTURE) IMPLANT
SUT VIC AB 0 CT1 27XBRD ANTBC (SUTURE) IMPLANT
SUT VIC AB 2-0 CT1 TAPERPNT 27 (SUTURE) IMPLANT
SUT VIC AB 2-0 SH 18 (SUTURE) IMPLANT
SUT VIC AB 2-0 SH 27X BRD (SUTURE) IMPLANT
SUT VIC AB 3-0 CT1 36 (SUTURE) IMPLANT
SUT VIC AB 3-0 SH 18 (SUTURE) IMPLANT
SUT VIC AB 3-0 SH 27X BRD (SUTURE) IMPLANT
SUT VIC AB 3-0 SH 27XBRD (SUTURE) IMPLANT
SUT VIC AB 4-0 PS2 27 (SUTURE) IMPLANT
SYR 20ML LL LF (SYRINGE) ×3 IMPLANT
SYR BULB IRRIG 60ML STRL (SYRINGE) IMPLANT
SYR CONTROL 10ML LL (SYRINGE) ×3 IMPLANT
TOWEL OR 17X26 10 PK STRL BLUE (TOWEL DISPOSABLE) ×3 IMPLANT
TRAY FOLEY MTR SLVR 16FR STAT (SET/KITS/TRAYS/PACK) IMPLANT
UNDERPAD 30X36 HEAVY ABSORB (UNDERPADS AND DIAPERS) ×3 IMPLANT
WATER STERILE IRR 1000ML POUR (IV SOLUTION) ×3 IMPLANT
YANKAUER SUCT BULB TIP 10FT TU (MISCELLANEOUS) IMPLANT
YANKAUER SUCT BULB TIP NO VENT (SUCTIONS) IMPLANT

## 2023-06-28 NOTE — Op Note (Signed)
 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   Postoperative Diagnosis: same as above   Surgery: Partial modified radical left vulvectomy, bilateral inguinal lymphadenectomy   Surgeons:  Eugene Garnet MD   Assistant: Warner Mccreedy NP   Anesthesia: General    Estimated blood loss: 300 ml   IVF:  see I&O flowsheet    Urine output: 400 ml    Complications: None apparent   Pathology: Bilateral inguinal lymph nodes, left vulva stitch at 12 o'clock   Operative findings: 3 cm left inner labial ulcerated mass, no deeper invasion on palpation; palpably enlarged lymph nodes in left groin, one measuring > 2cm and fixed to symphysis and inguinal ligament; several other prominent but non-enlarged left inguinal lymph nodes; several mildly prominent but not enlarged lymph nodes in right groin. Plan to leave foley catheter in post-op for 1 week to assure no obstruction of urethral orifice.   Procedure: The patient was identified in the preoperative holding area. Informed consent was signed on the chart. Patient was seen history was reviewed and exam was performed.    The patient was then taken to the operating room and placed in the supine position with SCD hose on. General anesthesia was then induced without difficulty. She was then placed in the dorsolithotomy position. The perineum was prepped with CHG. The vagina was prepped with CHG. The lower abdomen and upper thighs were prepped with CHG. The patient was then draped after the prep was dried. A Foley catheter was inserted into the bladder under sterile conditions.   Timeout was performed the patient, procedure, antibiotic, allergy, and length of procedure.    Attention was first turned to the inguinal node dissection. The bony landmarks of the pubic tubercle and the ASIS were identified and the palpable location of the femoral  artery. An 8-10 cm incision was made 2 fingerbreadth below the inguinal ligament on the left anterior thigh/groin parallel to the inguinal ligament. The camper's fascia was scored and the inferior and superior skin flaps were created with elevation of skin hooks and use of the bovie for dissection. Attention was turned to the palpably enlarged lymph nodes and surrounding lymph nodes. The fixed node was carefully dissected from from the pubic symphysis and and inguinal ligament medially. This was done with a combination of blunt dissection and monopolar electrocautery, controlling small vessels with small clips.  The generally boundaries of this dissection was the sartorius muscle laterally, the adductor longus tendon medially, the inguinal ligament superiorally, the cribiform fascia posteriorally; however given plan to debulk only enlarged lymph nodes, most of the nodal tissue was taken at the medial aspect of the dissection bed. Small blood vessels were controlled with cautery or clips.  The great saphenous vein was identified and skeletonized and taken given adherence of enlarged lymph node to the vessel. A stab wound was made with an 11 blade scalpel lateral and inferior to the incision and a 10 french flat Blake drain was placed in the bed of the groin bed. It was secured with nylon. The camper's fascia was closed with running 2-0 vicryl overlying the drain. The skin was closed with running 4-0 monocryl and dermabond.   A duplicate dissection was performed on the right and drain left.  On the right, although no enlarged lymph nodes noted, several prominent lymph nodes were excised. Closure was the same for the right.  The vulvar tissues was then examined. The vulvar lesion was identified and the marking pen was used to circumscribe the area with appropriate surgical margins. The 15 blade scalpel was used to make an incision through the skin circumferentially as marked. The skin elipse was grasped and was  separated from the underlying deep dermal tissues with the bovie device. The handheld harmonic was then used to cauterize and transect the subcutaneous tissue and vulvar musculature. The dissection was carried nearly to the endopelvic fascia along the medial aspect of the incision. After the specimen had been completely resected, it was oriented and marked at 12 o'clock with a 0-vicryl suture. Deeper resection margin was taken from the lateral aspect of the incision. The bovie was used to obtain hemostasis at the surgical bed. Along the medial aspect, some bleeding was noted and made hemostatic with running locked 2-0 Vicryl suture. The subcutaneous tissues were irrigated and made hemostatic.    The deep dermal layer was approximated with 2-0 vicryl mattress sutures to bring the skin edges into approximation and off tension. The wound was closed following langher's lines. The more superficial subcutaneous tissue was reapproximated with 3-0 vicryl with mattress sutures. The cutaneous layer was closed with interrupted 4-0 vicryl stitches and mattress sutures to ensure a tension free and hemostatic closure. The perineum was again irrigated. The foley was left in place.   Exparel was injected for local anesthesia.  All instrument, suture, laparotomy, Ray-Tec, and needle counts were correct x2. The patient tolerated the procedure well and was taken recovery room in stable condition.    Eugene Garnet MD Gynecologic Oncology

## 2023-06-28 NOTE — Anesthesia Procedure Notes (Signed)
 Procedure Name: Intubation Date/Time: 06/28/2023 11:27 AM  Performed by: Vanessa [place], CRNAPre-anesthesia Checklist: Patient identified, Emergency Drugs available, Suction available and Patient being monitored Patient Re-evaluated:Patient Re-evaluated prior to induction Oxygen Delivery Method: Circle system utilized Preoxygenation: Pre-oxygenation with 100% oxygen Induction Type: IV induction Ventilation: Two handed mask ventilation required Laryngoscope Size: Mac and 3 Grade View: Grade II Tube type: Oral Tube size: 7.0 mm Number of attempts: 1 Airway Equipment and Method: Stylet Placement Confirmation: ETT inserted through vocal cords under direct vision, positive ETCO2 and breath sounds checked- equal and bilateral Secured at: 21 cm Tube secured with: Tape Dental Injury: Teeth and Oropharynx as per pre-operative assessment

## 2023-06-28 NOTE — Interval H&P Note (Signed)
 History and Physical Interval Note:  06/28/2023 10:35 AM  Doris Reyes  has presented today for surgery, with the diagnosis of VULVAR CANCER.  The various methods of treatment have been discussed with the patient and family. After consideration of risks, benefits and other options for treatment, the patient has consented to  Procedure(s) with comments: EXAM UNDER ANESTHESIA, PELVIC (N/A) VULVECTOMY, RADICAL (N/A) - Possible skin flap LYMPHADENECTOMY, INGUINAL, OPEN (N/A) as a surgical intervention.  The patient's history has been reviewed, patient examined, no change in status, stable for surgery.  I have reviewed the patient's chart and labs.  Questions were answered to the patient's satisfaction.     Carver Fila

## 2023-06-28 NOTE — Transfer of Care (Signed)
 Immediate Anesthesia Transfer of Care Note  Patient: MALLORIE NORROD  Procedure(s) Performed: Francia Greaves UNDER ANESTHESIA, PELVIC (Vagina ) VULVECTOMY, RADICAL (Vulva) LYMPHADENECTOMY, INGUINAL, OPEN (Inguinal)  Patient Location: PACU  Anesthesia Type:General  Level of Consciousness: drowsy  Airway & Oxygen Therapy: Patient Spontanous Breathing and Patient connected to face mask  Post-op Assessment: Report given to RN and Post -op Vital signs reviewed and stable  Post vital signs: Reviewed and stable  Last Vitals:  Vitals Value Taken Time  BP 143/63 06/28/23 1427  Temp    Pulse 61 06/28/23 1429  Resp 15 06/28/23 1429  SpO2 100 % 06/28/23 1429  Vitals shown include unfiled device data.  Last Pain:  Vitals:   06/28/23 1000  TempSrc: Oral         Complications: No notable events documented.

## 2023-06-28 NOTE — Plan of Care (Signed)
  Problem: Health Behavior/Discharge Planning: Goal: Ability to manage health-related needs will improve Outcome: Progressing   Problem: Clinical Measurements: Goal: Will remain free from infection Outcome: Progressing   Problem: Nutrition: Goal: Adequate nutrition will be maintained Outcome: Progressing

## 2023-06-28 NOTE — Anesthesia Preprocedure Evaluation (Addendum)
 Anesthesia Evaluation  Patient identified by MRN, date of birth, ID band Patient awake    Reviewed: Allergy & Precautions, NPO status , Patient's Chart, lab work & pertinent test results, reviewed documented beta blocker date and time   Airway Mallampati: III  TM Distance: >3 FB Neck ROM: Full  Mouth opening: Limited Mouth Opening  Dental  (+) Dental Advisory Given, Teeth Intact   Pulmonary sleep apnea and Continuous Positive Airway Pressure Ventilation , neg recent URI   breath sounds clear to auscultation       Cardiovascular hypertension, Pt. on home beta blockers and Pt. on medications pulmonary hypertension+ Valvular Problems/Murmurs  Rhythm:Regular Rate:Normal + Systolic murmurs CT PET 05/2023 Noted cardiomegaly, DOE, ? Angina - states it's related to anxiety more than exertion.    Echo 08/2016 - Left ventricle: The cavity size was normal. Wall thickness was   normal. Systolic function was normal. The estimated ejection fraction was in the range of 60% to 65%. Wall motion was normal; there were no regional wall motion abnormalities. Features are consistent with a pseudonormal left ventricular filling pattern, with concomitant abnormal relaxation and increased filling pressure (grade 2 diastolic dysfunction).  - Pulmonary arteries: Systolic pressure was mildly increased. PA peak pressure: 35 mm Hg (S).      Neuro/Psych  PSYCHIATRIC DISORDERS Anxiety Depression     Neuromuscular disease    GI/Hepatic Neg liver ROS, hiatal hernia,GERD  Controlled and Medicated,,  Endo/Other  negative endocrine ROS    Renal/GU negative Renal ROS     Musculoskeletal  (+)  Fibromyalgia -  Abdominal  (+) + obese  Peds  Hematology  (+) Blood dyscrasia, anemia   Anesthesia Other Findings   Reproductive/Obstetrics                             Anesthesia Physical Anesthesia Plan  ASA: 4  Anesthesia Plan: General    Post-op Pain Management: Tylenol PO (pre-op)*   Induction: Intravenous  PONV Risk Score and Plan: 3 and Ondansetron, Dexamethasone and Treatment may vary due to age or medical condition  Airway Management Planned: Oral ETT and Video Laryngoscope Planned  Additional Equipment:   Intra-op Plan:   Post-operative Plan: Extubation in OR  Informed Consent: I have reviewed the patients History and Physical, chart, labs and discussed the procedure including the risks, benefits and alternatives for the proposed anesthesia with the patient or authorized representative who has indicated his/her understanding and acceptance.     Dental advisory given  Plan Discussed with: CRNA  Anesthesia Plan Comments:         Anesthesia Quick Evaluation

## 2023-06-29 ENCOUNTER — Encounter (HOSPITAL_COMMUNITY): Payer: Self-pay | Admitting: Gynecologic Oncology

## 2023-06-29 LAB — BASIC METABOLIC PANEL WITH GFR
Anion gap: 10 (ref 5–15)
BUN: 18 mg/dL (ref 8–23)
CO2: 25 mmol/L (ref 22–32)
Calcium: 8.8 mg/dL — ABNORMAL LOW (ref 8.9–10.3)
Chloride: 104 mmol/L (ref 98–111)
Creatinine, Ser: 0.83 mg/dL (ref 0.44–1.00)
GFR, Estimated: >60 mL/min (ref >60)
Glucose, Bld: 117 mg/dL — ABNORMAL HIGH (ref 70–99)
Potassium: 3.8 mmol/L (ref 3.5–5.1)
Sodium: 139 mmol/L (ref 135–145)

## 2023-06-29 LAB — CBC
HCT: 37.6 % (ref 36.0–46.0)
Hemoglobin: 11.8 g/dL — ABNORMAL LOW (ref 12.0–15.0)
MCH: 28.4 pg (ref 26.0–34.0)
MCHC: 31.4 g/dL (ref 30.0–36.0)
MCV: 90.4 fL (ref 80.0–100.0)
Platelets: 209 10*3/uL (ref 150–400)
RBC: 4.16 MIL/uL (ref 3.87–5.11)
RDW: 13.2 % (ref 11.5–15.5)
WBC: 10.1 10*3/uL (ref 4.0–10.5)
nRBC: 0 % (ref 0.0–0.2)

## 2023-06-29 MED ORDER — CHLORHEXIDINE GLUCONATE CLOTH 2 % EX PADS
6.0000 | MEDICATED_PAD | Freq: Every day | CUTANEOUS | Status: DC
Start: 2023-06-30 — End: 2023-06-30
  Administered 2023-06-29 – 2023-06-30 (×2): 6 via TOPICAL

## 2023-06-29 MED ORDER — PHENAZOPYRIDINE HCL 100 MG PO TABS
100.0000 mg | ORAL_TABLET | Freq: Three times a day (TID) | ORAL | Status: DC
  Administered 2023-06-29 – 2023-06-30 (×3): 100 mg via ORAL
  Filled 2023-06-29 (×3): qty 1

## 2023-06-29 MED ORDER — PSEUDOEPHEDRINE HCL 30 MG PO TABS: 30.0000 mg | ORAL_TABLET | ORAL | Status: DC | PRN

## 2023-06-29 MED ORDER — PHENYLEPHRINE HCL 10 MG PO TABS: 10.0000 mg | ORAL_TABLET | ORAL | Status: DC | PRN

## 2023-06-29 MED ORDER — LORATADINE 10 MG PO TABS
10.0000 mg | ORAL_TABLET | Freq: Every day | ORAL | Status: DC
  Administered 2023-06-29 – 2023-06-30 (×2): 10 mg via ORAL
  Filled 2023-06-29 (×2): qty 1

## 2023-06-29 MED ORDER — ENOXAPARIN SODIUM 40 MG/0.4ML IJ SOSY
40.0000 mg | PREFILLED_SYRINGE | INTRAMUSCULAR | Status: DC
Start: 2023-06-29 — End: 2023-06-30
  Administered 2023-06-29: 40 mg via SUBCUTANEOUS
  Filled 2023-06-29: qty 0.4

## 2023-06-29 NOTE — Anesthesia Postprocedure Evaluation (Signed)
 Anesthesia Post Note  Patient: Doris Reyes  Procedure(s) Performed: EXAM UNDER ANESTHESIA, PELVIC (Vagina ) VULVECTOMY, RADICAL (Vulva) LYMPHADENECTOMY, INGUINAL, OPEN (Inguinal)     Patient location during evaluation: PACU Anesthesia Type: General Level of consciousness: sedated and patient cooperative Pain management: pain level controlled Vital Signs Assessment: post-procedure vital signs reviewed and stable Respiratory status: spontaneous breathing Cardiovascular status: stable Anesthetic complications: no   No notable events documented.  Last Vitals:  Vitals:   06/29/23 0831 06/29/23 1000  BP: 125/66 105/60  Pulse:  60  Resp:  18  Temp:  36.8 C  SpO2:  98%    Last Pain:  Vitals:   06/29/23 1000  TempSrc: Oral  PainSc:                  Lewie Loron

## 2023-06-29 NOTE — Progress Notes (Signed)
   06/29/23 0939  TOC Brief Assessment  Insurance and Status Reviewed  Patient has primary care physician Yes  Home environment has been reviewed home alone  Prior level of function: independent  Prior/Current Home Services No current home services  Social Drivers of Health Review SDOH reviewed no interventions necessary  Readmission risk has been reviewed Yes  Transition of care needs no transition of care needs at this time

## 2023-06-29 NOTE — Progress Notes (Signed)
 Teaching on JP drains given to patient and daughter. Emptying and recharging drain demonstrated to patient and all questions answered. Pt and daughter verbalized understanding and able to teach back information.

## 2023-06-29 NOTE — Discharge Instructions (Addendum)
 AFTER SURGERY INSTRUCTIONS   Return to work: 4-6 weeks if applicable   We recommend purchasing several bags of frozen green peas and dividing them into ziploc bags. You will want to keep these in the freezer and have them ready to use as ice packs to the vulvar incision. Once the ice pack is no longer cold, you can get another from the freezer. The frozen peas mold to your body better than a regular ice pack.   Plan to go home with the foley catheter in place for around one week. We will bring you back to the office for evaluation and possible removal. You will also need to keep a recording log of the output from your drains. Empty these multiple times a day. When the amount decreases to a certain amount then we can remove these in the office.   Activity: 1. Be up and out of the bed during the day.  Take a nap if needed.  You may walk up steps but be careful and use the hand rail.  Stair climbing will tire you more than you think, you may need to stop part way and rest.    2. No lifting or straining for 6 weeks over 10 pounds. No pushing, pulling, straining for 6 weeks.   3. No driving for 1-61 days when the following criteria have been met: Do not drive if you are taking narcotic pain medicine and make sure that your reaction time has returned.    4. You can shower as soon as the next day after surgery. Shower daily.  Use your regular soap and water (not directly on the incision) and pat your incision(s) dry afterwards; don't rub.  No tub baths or submerging your body in water until cleared by your surgeon. If you have the soap that was given to you by pre-surgical testing that was used before surgery, you do not need to use it afterwards because this can irritate your incisions.    5. No sexual activity and nothing in the vagina for 4-6 weeks.   6. You may experience a small amount of clear drainage from your incisions, which is normal.  If the drainage persists, increases, or changes color  please call the office.   7. Do not use creams, lotions, or ointments such as neosporin on your incisions after surgery until advised by your surgeon because they can cause removal of the dermabond glue on your incisions.     8. You may experience vulvar spotting after surgery or when the stitches at the top of the vulvar begin to dissolve.  The spotting is normal but if you experience heavy bleeding, call our office.   9. Take Tylenol or ibuprofen first for pain if you are able to take these medications and only use narcotic pain medication for severe pain not relieved by the Tylenol or Ibuprofen.  Monitor your Tylenol intake to a max of 4,000 mg in a 24 hour period. You can alternate these medications after surgery.   Diet: 1. Low sodium Heart Healthy Diet is recommended but you are cleared to resume your normal (before surgery) diet after your procedure.   2. It is safe to use a laxative, such as Miralax or Colace, if you have difficulty moving your bowels before surgery. You have been prescribed Sennakot-S to take at bedtime every evening after surgery to keep bowel movements regular and to prevent constipation.     Wound Care: 1. Keep clean and dry.  Shower daily.  Reasons to call the Doctor: Fever - Oral temperature greater than 100.4 degrees Fahrenheit Foul-smelling vaginal discharge Difficulty urinating Nausea and vomiting Increased pain at the site of the incision that is unrelieved with pain medicine. Difficulty breathing with or without chest pain New calf pain especially if only on one side Sudden, continuing increased vaginal bleeding with or without clots.   Contacts: For questions or concerns you should contact:   Dr. Eugene Garnet at [phone]   Warner Mccreedy, NP at [phone]   After Hours: call [phone] and have the GYN Oncologist paged/contacted (after 5 pm or on the weekends). You will speak with an after hours RN and let he or she know you have had  surgery.   Messages sent via mychart are for non-urgent matters and are not responded to after hours so for urgent needs, please call the after hours number.

## 2023-06-29 NOTE — Progress Notes (Addendum)
 1 Day Post-Op Procedure(s) (LRB): EXAM UNDER ANESTHESIA, PELVIC (N/A) VULVECTOMY, RADICAL (N/A) LYMPHADENECTOMY, INGUINAL, OPEN (N/A)  Subjective: Patient reports feeling limited with her mobility due to moderate pain and feels she is unable to be discharged today. Feels she is having gas pains as well. No nausea or emesis reported. Tolerating diet. Planning on increasing mobility. Feels she is having bladder spasms. Family at the bedside. No other concerns voiced.     Objective: Vital signs in last 24 hours: Temp:  [96.3 F (35.7 C)-98 F (36.7 C)] 97.9 F (36.6 C) (04/03 0606) Pulse Rate:  [58-66] 59 (04/03 0606) Resp:  [12-18] 18 (04/03 0606) BP: (110-133)/(54-95) 125/66 (04/03 0831) SpO2:  [92 %-100 %] 100 % (04/03 0606) Last BM Date : 06/29/23  Intake/Output from previous day: 04/02 0701 - 04/03 0700 In: 2263 [P.O.:1090; I.V.:1073; IV Piggyback:100] Out: 2105 [Urine:1700; Drains:105; Blood:300]  Physical Examination: General: alert, cooperative, and no distress Resp: clear to auscultation bilaterally Cardio: regular rate and rhythm, S1, S2 normal, no murmur, click, rub or gallop GI: soft, non-tender; bowel sounds normal; no masses,  no organomegaly Extremities: extremities normal, atraumatic, no cyanosis or edema Bilateral inguinal groin incisions are intact with dermabond. Bilateral inguinal JP drains stripped by Dr. Pricilla Holm with serosanguinous drainage. Vulvar incision intact with minimal bleeding/drainage on the peripad. Ice peripad to the vulva  Labs: None. Labs ordered for this am.  Assessment: 73 y.o. s/p Procedure(s): EXAM UNDER ANESTHESIA, PELVIC VULVECTOMY, RADICAL LYMPHADENECTOMY, INGUINAL, OPEN: stable Pain:  Pain is somewhat well-controlled on PRN medications.  CV: BP and HR stable post-op. Continue to monitor with routine vital signs while inpatient.  GI:  Tolerating po: yes. Antiemetics ordered if needed.   GU: Foley is in place. To be discharged home  with this in place with re-evaluation in the office around 7 days post-op.    Prophylaxis: SCDs on. Lovenox ordered since pt not being discharged today.  Plan: Labs ordered for this am Foley and JP drain teaching. Pt to be discharged home with foley and drains in place. Pyridium for bladder spasms from foley Case manager to see if home health is an option for incision assessment, drain evaluation Encouraged increasing ambulation today with assist Plan for discharge home tomorrow if patient is doing well and meeting milestones   LOS: 1 day    Azaryah Heathcock D Eneida Evers 06/29/2023, 10:12 AM

## 2023-06-29 NOTE — Progress Notes (Signed)
 Mobility Specialist - Progress Note   06/29/23 0828  Mobility  Activity Ambulated with assistance in hallway  Level of Assistance Modified independent, requires aide device or extra time  Assistive Device Front wheel walker  Distance Ambulated (ft) 500 ft  Activity Response Tolerated well  Mobility Referral Yes  Mobility visit 1 Mobility  Mobility Specialist Start Time (ACUTE ONLY) 0817  Mobility Specialist Stop Time (ACUTE ONLY) 0827  Mobility Specialist Time Calculation (min) (ACUTE ONLY) 10 min   Pt received in room standing and agreeable to mobility. No complaints during session. Pt to recliner for meal after session with all needs met.    [facility]

## 2023-06-30 NOTE — Progress Notes (Signed)
 AVS reviewed w/ pt & daughter - both verbalized an understanding - JP & foley teaching done - foley bag changed - pt did not want to use a leg bag. Leg strap changed & extra one given to pt. PIV removed as noted. Pt dressed for d/c to home. To lobby via w/c

## 2023-06-30 NOTE — Discharge Summary (Signed)
 Physician Discharge Summary  Patient ID: Doris Reyes MRN: [id number] DOB/AGE: 07/09/1950 73 y.o.  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    Hospital Course: On 06/28/2023, the patient underwent the following: Procedure(s): EXAM UNDER ANESTHESIA, PELVIC VULVECTOMY, RADICAL, LYMPHADENECTOMY, INGUINAL, OPEN. The postoperative course was uneventful overall with limited mobility due to pain post-op.  She was discharged to home on postoperative day 2 tolerating a regular diet, ambulating with walker as support, passing flatus, foley in place, instructed on JP drains and foley care, pain controlled with oral medications.   Consults: None  Significant Diagnostic Studies: Labs  Treatments: Surgery: see above  Discharge Exam (performed by Dr. Pricilla Holm): Blood pressure (!) 101/53, pulse 66, temperature 98.3 F (36.8 C), temperature source Oral, resp. rate 16, height 5\' 3"  (1.6 m), weight 215 lb (97.5 kg), SpO2 94%. General appearance: alert, cooperative, and no distress Resp: clear to auscultation bilaterally Cardio: regular rate and rhythm, S1, S2 normal, no murmur, click, rub or gallop GI: soft, non-tender; bowel sounds normal; no masses,  no organomegaly Extremities: extremities normal, atraumatic, no cyanosis or edema Incision/Wound: Bilateral inguinal incisions are intact with dermabond. Bilateral inguinal JP drains are stripped and charged with serosanguinous (more serous) drainage. No significant bleeding on vulvar peripad  Disposition: Discharge disposition: 01-Home or Self Care       Discharge Instructions     Call MD for:  difficulty breathing, headache or visual disturbances   Complete by: As directed    Call MD for:  extreme fatigue    Complete by: As directed    Call MD for:  hives   Complete by: As directed    Call MD for:  persistant dizziness or light-headedness   Complete by: As directed    Call MD for:  persistant nausea and vomiting   Complete by: As directed    Call MD for:  redness, tenderness, or signs of infection (pain, swelling, redness, odor or green/yellow discharge around incision site)   Complete by: As directed    Call MD for:  severe uncontrolled pain   Complete by: As directed    Call MD for:  temperature >100.4   Complete by: As directed    Diet - low sodium heart healthy   Complete by: As directed    Driving Restrictions   Complete by: As directed    No driving for around 1 week(s) until the following criteria have been met.  Do not take narcotics and drive. You need to make sure your reaction time has returned and you can brake safely.   Increase activity slowly   Complete by: As directed    Lifting restrictions   Complete by: As directed    No lifting greater than 10 lbs, pushing, pulling, straining for 6 weeks.   Sexual Activity Restrictions   Complete by: As directed    No sexual activity, nothing in the vagina, for 6 weeks.      Allergies as of 06/30/2023       Reactions   Ceftin [cefuroxime Axetil] Diarrhea   Cefuroxime Other (See Comments)   IBS   Tramadol Hcl Nausea And Vomiting        Medication List     PAUSE taking these medications    estradiol 0.1 MG/GM  vaginal cream Wait to take this until: July 26, 2023 Commonly known as: ESTRACE VAGINAL Place 1 Applicatorful vaginally 3 (three) times a week.       TAKE these medications    acetaminophen 650 MG CR tablet Commonly known as: TYLENOL 1,300 mg in the morning and at bedtime.   ALPRAZolam 0.25 MG tablet Commonly known as: XANAX Take 0.25 mg by mouth at bedtime as needed for anxiety.   amLODipine 5 MG tablet Commonly known as: NORVASC Take 5 mg by mouth daily.   aspirin EC 81 MG tablet Take 81 mg by  mouth at bedtime. Swallow whole.   atenolol 50 MG tablet Commonly known as: TENORMIN Take 75 mg by mouth daily.   benazepril 10 MG tablet Commonly known as: LOTENSIN Take 10 mg by mouth daily.   benzonatate 100 MG capsule Commonly known as: TESSALON Take 100-200 mg by mouth at bedtime.   Biotin 1000 MCG tablet Take 1,000 mcg by mouth daily.   cetirizine 10 MG tablet Commonly known as: ZYRTEC Take 10 mg by mouth at bedtime.   CITRACAL + D PO Take 1 tablet by mouth daily.   cyanocobalamin 1000 MCG tablet Take 1,000 mcg by mouth daily.   diclofenac sodium 1 % Gel Commonly known as: VOLTAREN Apply 1 application  topically daily as needed (for pain).   diphenhydrAMINE 25 MG tablet Commonly known as: BENADRYL Take 25 mg by mouth every 6 (six) hours as needed for allergies.   diphenoxylate-atropine 2.5-0.025 MG tablet Commonly known as: LOMOTIL Take 2 tablets by mouth 4 (four) times daily as needed for diarrhea or loose stools.   DULoxetine 60 MG capsule Commonly known as: CYMBALTA Take 120 mg by mouth at bedtime.   EpiPen 2-Pak 0.3 mg/0.3 mL Soaj injection Generic drug: EPINEPHrine Inject 0.3 mg into the muscle as needed for anaphylaxis.   famotidine 20 MG tablet Commonly known as: PEPCID Take 20 mg by mouth at bedtime.   fexofenadine 180 MG tablet Commonly known as: ALLEGRA Take 180 mg by mouth in the morning.   Fish Oil 1000 MG Caps Take 1,000 mg by mouth daily.   fluticasone 50 MCG/ACT nasal spray Commonly known as: FLONASE Place 1 spray into both nostrils daily.   JUICE PLUS FIBRE PO Take 2 each by mouth daily. Fruits and Vegetables   lidocaine 5 % ointment Commonly known as: XYLOCAINE Apply 1 Application topically 2 (two) times daily as needed for moderate pain (pain score 4-6) (to the vulva). Apply to vulva as needed for discomfort   NON FORMULARY Pt uses a c-pap nightly   ondansetron 4 MG tablet Commonly known as: ZOFRAN Take 4 mg by mouth  every 8 (eight) hours as needed for nausea or vomiting.   oxyCODONE 5 MG immediate release tablet Commonly known as: Oxy IR/ROXICODONE Take 1 tablet (5 mg total) by mouth every 4 (four) hours as needed for severe pain (pain score 7-10). For AFTER surgery only, do not take and drive   pantoprazole 40 MG tablet Commonly known as: PROTONIX Take 40 mg by mouth every evening.   polyvinyl alcohol 1.4 % ophthalmic solution Commonly known as: LIQUIFILM TEARS Place 1 drop into both eyes 5 (five) times daily.   PROBIOTIC & ACIDOPHILUS EX ST PO Take 1 capsule by mouth daily. Ultra Flora Plus Capsules   REFRESH P.M. OP Place 1 Application into both eyes at bedtime.   rOPINIRole 0.5 MG tablet Commonly known as: REQUIP Take 0.5 mg by mouth at bedtime.  senna-docusate 8.6-50 MG tablet Commonly known as: Senokot-S Take 2 tablets by mouth at bedtime. For AFTER surgery, do not take if having diarrhea   simvastatin 10 MG tablet Commonly known as: ZOCOR Take 10 mg by mouth at bedtime.   sucralfate 1 GM/10ML suspension Commonly known as: CARAFATE Take 1 g by mouth 2 (two) times daily.   Systane Complete 0.6 % Soln Generic drug: Propylene Glycol Place 1 drop into both eyes in the morning and at bedtime.   traZODone 50 MG tablet Commonly known as: DESYREL Take 50 mg by mouth at bedtime.   TYLENOL SINUS SEVERE PO Take 2 tablets by mouth daily as needed (drainage).   Vitamin C 500 MG Chew Chew 500 mg by mouth daily.   Vitamin D 50 MCG (2000 UT) tablet Take 2,000 Units by mouth daily.   vitamin E 180 MG (400 UNITS) capsule Take 400 Units by mouth daily.        Follow-up Information     Carver Fila, MD Follow up on 07/05/2023.   Specialty: Gynecologic Oncology Why: in the evening will be a PHONE call with Dr. Pricilla Holm to check in and discuss pathology. The time is variable since she will be in surgery on this day. IN PERSON visit will be on 07/21/23 at 8:45 am at the [facility] information: [address] [place] [address] [phone]         Warner Mccreedy D, NP Follow up on 07/05/2023.   Specialty: Gynecologic Oncology Why: at 11 am at the [facility] information: [address] [place] [address] [phone]                 Greater than thirty minutes were spend for face to face discharge instructions and discharge orders/summary in EPIC.   Signed: Doylene Bode 06/30/2023, 8:57 AM

## 2023-06-30 NOTE — TOC Transition Note (Signed)
 Transition of Care [facility]) - Discharge Note   Patient Details  Name: Doris Reyes MRN: [id number] Date of Birth: 08/11/1950  Transition of Care [facility]) CM/SW Contact:  Amada Jupiter, LCSW Phone Number: 06/30/2023, 9:54 AM   Clinical Narrative:     Met with pt who is aware and agreeable with TOC arranging recommended [facility] visits.  Requests [facility] if possible.  Referral placed and accepted and notes can make first [facility] visit on Tuesday.  Pt will have support at home from daughter and other friends.  No further TOC needs.  Final next level of care: Home w Home Health Services Barriers to Discharge: Barriers Resolved   Patient Goals and CMS Choice Patient states their goals for this hospitalization and ongoing recovery are:: return home          Discharge Placement                       Discharge Plan and Services Additional resources added to the After Visit Summary for                  DME Arranged: N/A DME Agency: NA       HH Arranged: RN HH Agency: [facility] Date [facility] Agency Contacted: 06/30/23 Time HH Agency Contacted: [phone] Representative spoke with at [facility] Agency: Dewayne Hatch  Social Drivers of Health (SDOH) Interventions SDOH Screenings   Food Insecurity: No Food Insecurity (06/28/2023)  Housing: Low Risk  (06/28/2023)  Transportation Needs: No Transportation Needs (06/28/2023)  Utilities: Not At Risk (06/28/2023)  Depression (PHQ2-9): Medium Risk (06/01/2022)  Financial Resource Strain: Low Risk  (02/01/2022)  Social Connections: Moderately Isolated (06/28/2023)  Tobacco Use: Low Risk  (06/28/2023)     Readmission Risk Interventions    06/29/2023    9:38 AM  Readmission Risk Prevention Plan  Post Dischage Appt Complete  Medication Screening Complete  Transportation Screening Complete

## 2023-06-30 NOTE — Progress Notes (Addendum)
 GYN Oncology Progress Note  Feels same as yesterday. Ambulating. Using walker for assist. Has walker at home. Passing flatus. Bilateral inguinal drains with serosanguinous drainage. Foley with clear, yellow urine. Tolerating diet with no nausea or emesis. No BM post-op. Incisions intact with no drainage. Plan for discharge this am. RN to review drain and foley care again.

## 2023-06-30 NOTE — Progress Notes (Signed)
 Mobility Specialist - Progress Note   06/30/23 0828  Mobility  Activity Ambulated with assistance in hallway  Level of Assistance Modified independent, requires aide device or extra time  Assistive Device Front wheel walker  Distance Ambulated (ft) 500 ft  Range of Motion/Exercises Active  Activity Response Tolerated well  Mobility Referral Yes  Mobility visit 1 Mobility  Mobility Specialist Start Time (ACUTE ONLY) [id number]  Mobility Specialist Stop Time (ACUTE ONLY) [id number]  Mobility Specialist Time Calculation (min) (ACUTE ONLY) 14 min   Pt was found sitting EOB and agreeable to ambulate. No complaints with session. At EOS returned to sit EOB with all needs met. Call bell in reach.  Billey Chang Mobility Specialist

## 2023-07-03 ENCOUNTER — Telehealth: Payer: Self-pay | Admitting: *Deleted

## 2023-07-03 LAB — SURGICAL PATHOLOGY

## 2023-07-03 NOTE — Telephone Encounter (Addendum)
 Spoke with Doris Reyes this morning. She states she is eating, drinking and her foley is draining well clear yellow urine. Pt states her two jp drains are still draining and she is emptying them several times a day. Left drain emptied yesterday for 19 tsp. And the right drained 2 tablespoons and 4 tsp. She has  had a BM and is passing gas. She is taking senokot as prescribed and encouraged her to drink plenty of water. She denies fever or chills. Incisions are dry and intact. She rates her pain 2/10. Her pain is controlled with tylenol and she is only taking oxycodone at night.     Instructed to call office with any fever, chills, purulent drainage, uncontrolled pain or any other questions or concerns. Patient verbalizes understanding.   Pt aware of post op appointments as well as the office number [phone] and after hours number [phone] to call if she has any questions or concerns

## 2023-07-04 ENCOUNTER — Encounter: Payer: Self-pay | Admitting: Oncology

## 2023-07-04 DIAGNOSIS — Z466 Encounter for fitting and adjustment of urinary device: Secondary | ICD-10-CM | POA: Diagnosis not present

## 2023-07-04 DIAGNOSIS — D630 Anemia in neoplastic disease: Secondary | ICD-10-CM | POA: Diagnosis not present

## 2023-07-04 DIAGNOSIS — M797 Fibromyalgia: Secondary | ICD-10-CM | POA: Diagnosis not present

## 2023-07-04 DIAGNOSIS — Z79899 Other long term (current) drug therapy: Secondary | ICD-10-CM | POA: Diagnosis not present

## 2023-07-04 DIAGNOSIS — G2581 Restless legs syndrome: Secondary | ICD-10-CM | POA: Diagnosis not present

## 2023-07-04 DIAGNOSIS — K5790 Diverticulosis of intestine, part unspecified, without perforation or abscess without bleeding: Secondary | ICD-10-CM | POA: Diagnosis not present

## 2023-07-04 DIAGNOSIS — F32A Depression, unspecified: Secondary | ICD-10-CM | POA: Diagnosis not present

## 2023-07-04 DIAGNOSIS — R7303 Prediabetes: Secondary | ICD-10-CM | POA: Diagnosis not present

## 2023-07-04 DIAGNOSIS — Z7982 Long term (current) use of aspirin: Secondary | ICD-10-CM | POA: Diagnosis not present

## 2023-07-04 DIAGNOSIS — K589 Irritable bowel syndrome without diarrhea: Secondary | ICD-10-CM | POA: Diagnosis not present

## 2023-07-04 DIAGNOSIS — Z4803 Encounter for change or removal of drains: Secondary | ICD-10-CM | POA: Diagnosis not present

## 2023-07-04 DIAGNOSIS — F5101 Primary insomnia: Secondary | ICD-10-CM | POA: Diagnosis not present

## 2023-07-04 DIAGNOSIS — N3289 Other specified disorders of bladder: Secondary | ICD-10-CM | POA: Diagnosis not present

## 2023-07-04 DIAGNOSIS — E7800 Pure hypercholesterolemia, unspecified: Secondary | ICD-10-CM | POA: Diagnosis not present

## 2023-07-04 DIAGNOSIS — E611 Iron deficiency: Secondary | ICD-10-CM | POA: Diagnosis not present

## 2023-07-04 DIAGNOSIS — K219 Gastro-esophageal reflux disease without esophagitis: Secondary | ICD-10-CM | POA: Diagnosis not present

## 2023-07-04 DIAGNOSIS — E559 Vitamin D deficiency, unspecified: Secondary | ICD-10-CM | POA: Diagnosis not present

## 2023-07-04 DIAGNOSIS — I10 Essential (primary) hypertension: Secondary | ICD-10-CM | POA: Diagnosis not present

## 2023-07-04 DIAGNOSIS — J309 Allergic rhinitis, unspecified: Secondary | ICD-10-CM | POA: Diagnosis not present

## 2023-07-04 DIAGNOSIS — M810 Age-related osteoporosis without current pathological fracture: Secondary | ICD-10-CM | POA: Diagnosis not present

## 2023-07-04 DIAGNOSIS — G4733 Obstructive sleep apnea (adult) (pediatric): Secondary | ICD-10-CM | POA: Diagnosis not present

## 2023-07-04 DIAGNOSIS — Z483 Aftercare following surgery for neoplasm: Secondary | ICD-10-CM | POA: Diagnosis not present

## 2023-07-04 DIAGNOSIS — C519 Malignant neoplasm of vulva, unspecified: Secondary | ICD-10-CM | POA: Diagnosis not present

## 2023-07-04 DIAGNOSIS — F411 Generalized anxiety disorder: Secondary | ICD-10-CM | POA: Diagnosis not present

## 2023-07-04 DIAGNOSIS — G4761 Periodic limb movement disorder: Secondary | ICD-10-CM | POA: Diagnosis not present

## 2023-07-04 NOTE — Progress Notes (Signed)
 Will do - I figured she'd get radiation here and it would be easier to give her concurrent chemo here too. But I'll ask

## 2023-07-05 ENCOUNTER — Inpatient Hospital Stay: Attending: Oncology | Admitting: Gynecologic Oncology

## 2023-07-05 ENCOUNTER — Telehealth: Payer: Self-pay | Admitting: Oncology

## 2023-07-05 ENCOUNTER — Encounter: Payer: Self-pay | Admitting: Gynecologic Oncology

## 2023-07-05 ENCOUNTER — Encounter: Payer: Self-pay | Admitting: Oncology

## 2023-07-05 ENCOUNTER — Inpatient Hospital Stay (HOSPITAL_BASED_OUTPATIENT_CLINIC_OR_DEPARTMENT_OTHER): Admitting: Gynecologic Oncology

## 2023-07-05 VITALS — BP 125/57 | HR 66 | Temp 98.6°F | Resp 16 | Ht 63.0 in | Wt 213.0 lb

## 2023-07-05 DIAGNOSIS — Z803 Family history of malignant neoplasm of breast: Secondary | ICD-10-CM | POA: Diagnosis not present

## 2023-07-05 DIAGNOSIS — I10 Essential (primary) hypertension: Secondary | ICD-10-CM | POA: Diagnosis not present

## 2023-07-05 DIAGNOSIS — C774 Secondary and unspecified malignant neoplasm of inguinal and lower limb lymph nodes: Secondary | ICD-10-CM | POA: Insufficient documentation

## 2023-07-05 DIAGNOSIS — Z8041 Family history of malignant neoplasm of ovary: Secondary | ICD-10-CM | POA: Diagnosis not present

## 2023-07-05 DIAGNOSIS — C519 Malignant neoplasm of vulva, unspecified: Secondary | ICD-10-CM | POA: Insufficient documentation

## 2023-07-05 DIAGNOSIS — Z8042 Family history of malignant neoplasm of prostate: Secondary | ICD-10-CM | POA: Insufficient documentation

## 2023-07-05 DIAGNOSIS — Z9079 Acquired absence of other genital organ(s): Secondary | ICD-10-CM | POA: Insufficient documentation

## 2023-07-05 DIAGNOSIS — G4730 Sleep apnea, unspecified: Secondary | ICD-10-CM | POA: Insufficient documentation

## 2023-07-05 DIAGNOSIS — M797 Fibromyalgia: Secondary | ICD-10-CM | POA: Insufficient documentation

## 2023-07-05 DIAGNOSIS — M810 Age-related osteoporosis without current pathological fracture: Secondary | ICD-10-CM | POA: Insufficient documentation

## 2023-07-05 DIAGNOSIS — T8189XA Other complications of procedures, not elsewhere classified, initial encounter: Secondary | ICD-10-CM

## 2023-07-05 DIAGNOSIS — K219 Gastro-esophageal reflux disease without esophagitis: Secondary | ICD-10-CM | POA: Diagnosis not present

## 2023-07-05 DIAGNOSIS — Z923 Personal history of irradiation: Secondary | ICD-10-CM | POA: Insufficient documentation

## 2023-07-05 DIAGNOSIS — Z7189 Other specified counseling: Secondary | ICD-10-CM

## 2023-07-05 DIAGNOSIS — E785 Hyperlipidemia, unspecified: Secondary | ICD-10-CM | POA: Diagnosis not present

## 2023-07-05 DIAGNOSIS — Z7982 Long term (current) use of aspirin: Secondary | ICD-10-CM | POA: Diagnosis not present

## 2023-07-05 DIAGNOSIS — Z860100 Personal history of colon polyps, unspecified: Secondary | ICD-10-CM | POA: Insufficient documentation

## 2023-07-05 DIAGNOSIS — Z79899 Other long term (current) drug therapy: Secondary | ICD-10-CM | POA: Insufficient documentation

## 2023-07-05 DIAGNOSIS — K449 Diaphragmatic hernia without obstruction or gangrene: Secondary | ICD-10-CM | POA: Insufficient documentation

## 2023-07-05 MED ORDER — SULFAMETHOXAZOLE-TRIMETHOPRIM 800-160 MG PO TABS
1.0000 | ORAL_TABLET | Freq: Two times a day (BID) | ORAL | 0 refills | Status: DC
Start: 2023-07-05 — End: 2023-07-14

## 2023-07-05 NOTE — Patient Instructions (Signed)
 You have moderate swelling in the pubic area and on the vulva. You will need to apply ice daily to this area. You can use frozen peas or an ice pack of your choosing.   Continue to monitor the output from your JP drains and keep the recording sheet in mLs.   Due to the swelling, we will plan on keep the foley in for several more days.   We will plan on starting an antibiotic given the cloudy drainage from the vulvar incision and redness around the left drain site.   Plan to follow up as planned or sooner if needed.

## 2023-07-05 NOTE — Telephone Encounter (Signed)
 Spoke to the triage nurse at [facility] and requested to move up patient's appointment with Dr. Gilman Buttner. She is sending a note the schedulers and they will reach out to the patient with a new appointment.

## 2023-07-05 NOTE — Progress Notes (Signed)
 Gynecologic Oncology Return Clinic Visit  07/05/23  Reason for Visit: treatment planning  Treatment History: Oncology History  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)  06/08/2021 Initial Diagnosis   Breast cancer in female [facility])   07/23/2021 Genetic Testing   Negative hereditary cancer genetic testing: no pathogenic variants detected in [facility] BRCAPlus Panel. Report date is July 23, 2021.  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  The report date is Jul 26, 2021.  The BRCAplus panel offered by [employer] and includes sequencing and deletion/duplication analysis for the following 8 genes: ATM, BRCA1, BRCA2, CDH1, CHEK2, PALB2, PTEN, and TP53.  Results of pan-cancer panel pending.   The CancerNext-Expanded gene panel offered by [facility] and includes sequencing and rearrangement analysis for the following 77 genes: AIP, ALK, APC*, ATM*, AXIN2, BAP1, BARD1, BLM, BMPR1A, BRCA1*, BRCA2*, BRIP1*, CDC73, CDH1*, CDK4, CDKN1B, CDKN2A, CHEK2*, CTNNA1, DICER1, FANCC, FH, FLCN, GALNT12, KIF1B, LZTR1, MAX, MEN1, MET, MLH1*, MSH2*, MSH3, MSH6*, MUTYH*, NBN, NF1*, NF2, NTHL1, PALB2*, PHOX2B, PMS2*, POT1, PRKAR1A, PTCH1, PTEN*, RAD51C*, RAD51D*, RB1, RECQL, RET, SDHA, SDHAF2, SDHB, SDHC, SDHD, SMAD4, SMARCA4, SMARCB1, SMARCE1, STK11, SUFU, TMEM127, TP53*, TSC1, TSC2, VHL and XRCC2 (sequencing and deletion/duplication); EGFR, EGLN1, HOXB13, KIT, MITF, PDGFRA, POLD1, and POLE (sequencing only); EPCAM and GREM1 (deletion/duplication only). DNA and RNA analyses performed for * genes.    08/23/2021 Cancer Staging   Staging form: Breast, AJCC 8th Edition - Pathologic stage from 08/23/2021: Stage IA (pT2(2), pN0(sn), cM0, G1, ER+, PR+, HER2-) - Signed by Dellia Beckwith, MD on 09/29/2021 Histopathologic type: Lobular carcinoma, NOS Stage prefix: Initial  diagnosis Method of lymph node assessment: Sentinel lymph node biopsy Nuclear grade: G1 Multigene prognostic tests performed: EndoPredict Histologic grading system: 3 grade system Residual tumor (R): R0 - None Laterality: Right Tumor size (mm): 24 Multiple tumors: Yes Number of tumors: 2 Lymph-vascular invasion (LVI): LVI not present (absent)/not identified Diagnostic confirmation: Positive histology PLUS positive immunophenotyping and/or positive genetic studies Specimen type: Excision Staged by: Managing physician Menopausal status: Postmenopausal Ki-67 (%): 5 Stage used in treatment planning: Yes National guidelines used in treatment planning: Yes Type of national guideline used in treatment planning: NCCN     Interval History: Doing well. Seen my clinic today and given vulvar edema, catheter to remain in place for another week. Mild erythema around left groin incision thought secondary to non-draining drain (stripped).   Past Medical/Surgical History: Past Medical History:  Diagnosis Date   Anemia    Anxiety    Cancer (HCC)    right breast ILC   Complication of anesthesia    difficulty waking up   Depression    Diverticulitis    Family history of breast cancer    Family history of ovarian cancer    Family history of prostate cancer    Fibromyalgia    GERD (gastroesophageal reflux disease)    Heart murmur    History of colonic polyps    History of hiatal hernia    Hyperlipidemia    Hypertension    IBS (irritable bowel syndrome)    Ischemic colitis (HCC) 03/2012   Osteoporosis    Pre-diabetes    RLS (restless legs syndrome)    Sinusitis    Sleep apnea    CPAP nightly    Past Surgical History:  Procedure Laterality Date   BREAST LUMPECTOMY WITH RADIOACTIVE SEED LOCALIZATION Right 08/04/2021   Procedure: RIGHT  BREAST LUMPECTOMY WITH RADIOACTIVE SEED LOCALIZATION;  Surgeon: Harriette Bouillon, MD;  Location: [facility];  Service: General;  Laterality: Right;    CHOLECYSTECTOMY  2004   COLONOSCOPY WITH PROPOFOL N/A 04/18/2017   Procedure: COLONOSCOPY WITH PROPOFOL;  Surgeon: Charna Elizabeth, MD;  Location: [facility];  Service: Endoscopy;  Laterality: N/A;   ESOPHAGOGASTRODUODENOSCOPY (EGD) WITH PROPOFOL N/A 04/18/2017   Procedure: ESOPHAGOGASTRODUODENOSCOPY (EGD) WITH PROPOFOL;  Surgeon: Charna Elizabeth, MD;  Location: [facility];  Service: Endoscopy;  Laterality: N/A;   EXAM UNDER ANESTHESIA, PELVIC N/A 06/28/2023   Procedure: EXAM UNDER ANESTHESIA, PELVIC;  Surgeon: Carver Fila, MD;  Location: [facility];  Service: Gynecology;  Laterality: N/A;   FLEXIBLE SIGMOIDOSCOPY  04/20/2012   Procedure: FLEXIBLE SIGMOIDOSCOPY;  Surgeon: Theda Belfast, MD;  Location: [facility];  Service: Endoscopy;  Laterality: N/A;   INGUINAL LYMPHADENECTOMY N/A 06/28/2023   Procedure: LYMPHADENECTOMY, INGUINAL, OPEN;  Surgeon: Carver Fila, MD;  Location: [facility];  Service: Gynecology;  Laterality: N/A;   NISSEN FUNDOPLICATION  2011   PARAESOPHAGEAL HERNIA REPAIR  09/11/2009   and Nissen fundoplication   RADICAL VULVECTOMY N/A 06/28/2023   Procedure: VULVECTOMY, RADICAL;  Surgeon: Carver Fila, MD;  Location: [facility];  Service: Gynecology;  Laterality: N/A;  Possible skin flap   RE-EXCISION OF BREAST LUMPECTOMY Right 08/18/2021   Procedure: RE-EXCISION RIGHT BREAST LUMPECTOMY;  Surgeon: Harriette Bouillon, MD;  Location: [facility];  Service: General;  Laterality: Right;   SENTINEL NODE BIOPSY N/A 08/04/2021   Procedure: SENTINEL NODE BIOPSY;  Surgeon: Harriette Bouillon, MD;  Location: [facility];  Service: General;  Laterality: N/A;   TONSILLECTOMY  1960's   TOTAL ABDOMINAL HYSTERECTOMY  2001   w/ BSO    Family History  Problem Relation Age of Onset   Colon cancer Mother 90   Alzheimer's disease Mother    Heart attack Father    Cancer Maternal Aunt        x2   Alzheimer's disease Maternal Aunt    Ovarian cancer Maternal Aunt    Prostate cancer  Maternal Uncle    Kidney disease Maternal Uncle    Atrial fibrillation Maternal Uncle    Stroke Paternal Uncle    Aneurysm Paternal Grandmother        brain   Stroke Paternal Grandfather    Prostate cancer Cousin        paternal first cousin   Prostate cancer Cousin        paternal first cousin   Esophageal cancer Cousin        maternal first cousin   Breast cancer Cousin        DCIS, maternal first cousin   Ovarian cancer Cousin        maternal first cousin   Rheum arthritis Other    Lung disease Neg Hx     Social History   Socioeconomic History   Marital status: Widowed    Spouse name: Fayrene Fearing   Number of children: 1   Years of education: Not on file   Highest education level: Some college, no degree  Occupational History   Occupation: disabled  Tobacco Use   Smoking status: Never   Smokeless tobacco: Never   Tobacco comments:    brief exposure through her husband  Vaping Use   Vaping status: Never Used  Substance and Sexual Activity   Alcohol use: Never    Alcohol/week: 0.0 standard drinks of alcohol   Drug use: Never   Sexual activity:  Not Currently  Other Topics Concern   Not on file  Social History Narrative   From [place] originally. Previously lived in [place]. Previously worked at [employer] also in a [employer]. Has also worked as a Diplomatic Services operational officer for [employer]. No pets currently. No bird exposure. No known mold in her current home.    Lives with husband   Caffeine- coffee 2 c daily   Social Drivers of Health   Financial Resource Strain: Low Risk  (02/01/2022)   Overall Financial Resource Strain (CARDIA)    Difficulty of Paying Living Expenses: Not hard at all  Food Insecurity: No Food Insecurity (06/28/2023)   Hunger Vital Sign    Worried About Running Out of Food in the Last Year: Never true    Ran Out of Food in the Last Year: Never true  Transportation Needs: No Transportation Needs (06/28/2023)   PRAPARE - Scientist, research (physical sciences) (Medical): No    Lack of Transportation (Non-Medical): No  Physical Activity: Not on file  Stress: Not on file  Social Connections: Moderately Isolated (06/28/2023)   Social Connection and Isolation Panel [NHANES]    Frequency of Communication with Friends and Family: More than three times a week    Frequency of Social Gatherings with Friends and Family: Twice a week    Attends Religious Services: More than 4 times per year    Active Member of [employer] or Organizations: No    Attends Banker Meetings: Never    Marital Status: Widowed    Current Medications:  Current Outpatient Medications:    acetaminophen (TYLENOL) 650 MG CR tablet, 1,300 mg in the morning and at bedtime., Disp: , Rfl:    ALPRAZolam (XANAX) 0.25 MG tablet, Take 0.25 mg by mouth at bedtime as needed for anxiety., Disp: , Rfl:    amLODipine (NORVASC) 5 MG tablet, Take 5 mg by mouth daily., Disp: , Rfl:    Ascorbic Acid (VITAMIN C) 500 MG CHEW, Chew 500 mg by mouth daily., Disp: , Rfl:    aspirin EC 81 MG tablet, Take 81 mg by mouth at bedtime. Swallow whole., Disp: , Rfl:    atenolol (TENORMIN) 50 MG tablet, Take 75 mg by mouth daily. , Disp: , Rfl:    benazepril (LOTENSIN) 10 MG tablet, Take 10 mg by mouth daily., Disp: , Rfl:    benzonatate (TESSALON) 100 MG capsule, Take 100-200 mg by mouth at bedtime., Disp: , Rfl:    Biotin 1000 MCG tablet, Take 1,000 mcg by mouth daily., Disp: , Rfl:    Calcium Citrate-Vitamin D (CITRACAL + D PO), Take 1 tablet by mouth daily., Disp: , Rfl:    cetirizine (ZYRTEC) 10 MG tablet, Take 10 mg by mouth at bedtime., Disp: , Rfl:    Cholecalciferol (VITAMIN D) 50 MCG (2000 UT) tablet, Take 2,000 Units by mouth daily., Disp: , Rfl:    cyanocobalamin 1000 MCG tablet, Take 1,000 mcg by mouth daily., Disp: , Rfl:    diclofenac sodium (VOLTAREN) 1 % GEL, Apply 1 application  topically daily as needed (for pain)., Disp: , Rfl:    diphenhydrAMINE (BENADRYL) 25 MG tablet,  Take 25 mg by mouth every 6 (six) hours as needed for allergies., Disp: , Rfl:    diphenoxylate-atropine (LOMOTIL) 2.5-0.025 MG tablet, Take 2 tablets by mouth 4 (four) times daily as needed for diarrhea or loose stools., Disp: , Rfl:    DULoxetine (CYMBALTA) 60 MG capsule, Take 120 mg by mouth  at bedtime., Disp: , Rfl:    EPINEPHrine (EPIPEN 2-PAK) 0.3 mg/0.3 mL IJ SOAJ injection, Inject 0.3 mg into the muscle as needed for anaphylaxis., Disp: , Rfl:    [Paused] estradiol (ESTRACE VAGINAL) 0.1 MG/GM vaginal cream, Place 1 Applicatorful vaginally 3 (three) times a week., Disp: 42.5 g, Rfl: 12   famotidine (PEPCID) 20 MG tablet, Take 20 mg by mouth at bedtime., Disp: , Rfl:    fexofenadine (ALLEGRA) 180 MG tablet, Take 180 mg by mouth in the morning., Disp: , Rfl:    fluticasone (FLONASE) 50 MCG/ACT nasal spray, Place 1 spray into both nostrils daily., Disp: , Rfl:    lidocaine (XYLOCAINE) 5 % ointment, Apply 1 Application topically 2 (two) times daily as needed for moderate pain (pain score 4-6) (to the vulva). Apply to vulva as needed for discomfort, Disp: 35.44 g, Rfl: 3   NON FORMULARY, Pt uses a c-pap nightly, Disp: , Rfl:    Nutritional Supplements (JUICE PLUS FIBRE PO), Take 2 each by mouth daily. Fruits and Vegetables, Disp: , Rfl:    Omega-3 Fatty Acids (FISH OIL) 1000 MG CAPS, Take 1,000 mg by mouth daily. , Disp: , Rfl:    ondansetron (ZOFRAN) 4 MG tablet, Take 4 mg by mouth every 8 (eight) hours as needed for nausea or vomiting., Disp: , Rfl:    oxyCODONE (OXY IR/ROXICODONE) 5 MG immediate release tablet, Take 1 tablet (5 mg total) by mouth every 4 (four) hours as needed for severe pain (pain score 7-10). For AFTER surgery only, do not take and drive, Disp: 10 tablet, Rfl: 0   pantoprazole (PROTONIX) 40 MG tablet, Take 40 mg by mouth every evening., Disp: , Rfl:    Phenylephrine-APAP-Guaifenesin (TYLENOL SINUS SEVERE PO), Take 2 tablets by mouth daily as needed (drainage)., Disp: , Rfl:     polyvinyl alcohol (LIQUIFILM TEARS) 1.4 % ophthalmic solution, Place 1 drop into both eyes 5 (five) times daily., Disp: , Rfl:    Probiotic Product (PROBIOTIC & ACIDOPHILUS EX ST PO), Take 1 capsule by mouth daily. Ultra Flora Plus Capsules, Disp: , Rfl:    Propylene Glycol (SYSTANE COMPLETE) 0.6 % SOLN, Place 1 drop into both eyes in the morning and at bedtime., Disp: , Rfl:    rOPINIRole (REQUIP) 0.5 MG tablet, Take 0.5 mg by mouth at bedtime., Disp: , Rfl:    senna-docusate (SENOKOT-S) 8.6-50 MG tablet, Take 2 tablets by mouth at bedtime. For AFTER surgery, do not take if having diarrhea, Disp: 30 tablet, Rfl: 0   simvastatin (ZOCOR) 10 MG tablet, Take 10 mg by mouth at bedtime.  , Disp: , Rfl:    sucralfate (CARAFATE) 1 GM/10ML suspension, Take 1 g by mouth 2 (two) times daily., Disp: , Rfl:    sulfamethoxazole-trimethoprim (BACTRIM DS) 800-160 MG tablet, Take 1 tablet by mouth 2 (two) times daily., Disp: 14 tablet, Rfl: 0   traZODone (DESYREL) 50 MG tablet, Take 50 mg by mouth at bedtime., Disp: , Rfl:    vitamin E 400 UNIT capsule, Take 400 Units by mouth daily.  , Disp: , Rfl:    White Petrolatum-Mineral Oil (REFRESH P.M. OP), Place 1 Application into both eyes at bedtime., Disp: , Rfl:   Review of Systems: Deferred, see other documentation from today  Physical Exam: There were no vitals taken for this visit. See NP/RN note from today  Laboratory & Radiologic Studies: A. 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 Largest metastasis is  3 cm. Extensive extranodal tumor.  B. LYMPH NODE, RIGHT INGUINAL, RESECTION: Two lymph nodes negative for metastatic carcinoma (0/2).  C. 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 Greatest depth of invasion is 5 mm. Carcinoma in situ focally involves the 6:00 margin. Invasive carcinoma focally less than 1 mm from  6:00 margin. Lymphovascular space involvement by carcinoma. See oncology table and comment.  D. LATERAL DEEP MARGIN: Benign adipose tissue and connective tissue. Negative for carcinoma. Final lateral deep margin negative for carcinoma.  ONCOLOGY TABLE: VULVA, CARCINOMA: Resection Procedure: Pelvic vulvectomy and inguinal lymphadenectomy Tumor Focality: Unifocal Tumor Site: Left vulva Tumor Size: 3 x 2.8 cm Histologic Type: Squamous cell carcinoma Histologic Grade: Moderately differentiated Depth of Invasion:      Specify depth of invasion (mm): 5 mm Other Tissue/ Organ Involvement: Not applicable Lymphovascular Invasion: Present Margins:      Margins Involved by Invasive Carcinoma: All margins negative for invasive carcinoma      Invasive carcinoma focally less than 1 mm from 6:00 margin      Margin Status for HSIL or dVIN: 6:00 margin focally positive for carcinoma in situ Regional Lymph Nodes           Lymph Nodes Examined:                                   0 Sentinel                                   6 non-sentinel                                   6 total           Number of Nodes with Metastasis 5 mm or Greater: 2           Number of Nodes with Metastasis Less than 5 mm (excludes isolated tumor cells): 1           Number of Nodes with Isolated Tumor Cells (0.2 mm or less): 0           Additional Lymph Node Findings: Extensive extranodal extension Distant Metastasis:      Distant Site(s) Involved: Not applicable Pathologic Stage Classification (pTNM, AJCC 8th Edition): pT1b (IB), pN2c (IIIC) Ancillary Studies: Can be performed if requested Representative Tumor Block: C3-C5 Comment(s): The tumor is moderately differentiated invasive squamous cell carcinoma which is 3 cm in greatest dimension and shows 5 mm in greatest stromal invasion.  There is associated carcinoma in situ which focally involves the 6:00 margin and invasive carcinoma is focally less than 1 mm from  the 6:00 margin.  Invasive carcinoma is also focally 2 mm from the medial dermal margin in the midportion of the specimen. (v4.2.0.2)   Assessment & Plan: Doris Reyes is a 73 y.o. woman with Stage IIIC SCC of the vulva who presents for follow-up.  Doing well post-op. Seen by Warner Mccreedy today for exam, drain check, vulvar check. Please see her note for further details.  I was scheduled to have a phone visit with the patient to review pathology but we were able to do this in person. I reviewed findings with patient and her daughter from surgery  again as well as pathology results. Given multiple involved nodes as well as some high-risk features of her vulvar tumor, I recommend radiation (both to vulva as well as to bilateral groins with pelvic coverage) with consideration of addition of cisplatin and pembro (given advanced disease and PDL1 CPS 85%). The patient's preference to receive treatment in . Clydie Braun, our nurse navigator, is working on referrals back to the patient's medical oncologist and radiation oncologist.  12 minutes of total time was spent for this patient encounter, including preparation, face-to-face counseling with the patient and coordination of care, and documentation of the encounter.  Eugene Garnet, MD  Division of Gynecologic Oncology  Department of Obstetrics and Gynecology  [facility]

## 2023-07-05 NOTE — Progress Notes (Unsigned)
 Patient presents to the office for post-operative follow up. She has overall been doing well since surgery. No fever or chills at home. Tolerating diet with no nausea or emesis. Having bowel movements without difficulty and continuing bowel regimen. She has had some bleeding from the vulvar incision. The left JP drain site has been leaking yellow fluid.

## 2023-07-07 ENCOUNTER — Telehealth: Payer: Self-pay | Admitting: *Deleted

## 2023-07-07 NOTE — Telephone Encounter (Signed)
 Spoke with Doris Reyes who states she is  doing well. Pt is still emptying her JP drains. States the right JP drain is hardly draining anything, but the left is still draining, not as much as it original was, but still draining. Pt is still writing down the amounts. Pt also states the left drain site is looking better and less red. Pt denies fever, chills or pain. She was reminded of her follow up appt.with Warner Mccreedy, NP on Tuesday at 1:30. Pt verbalized understanding and thanked the office for calling.

## 2023-07-10 DIAGNOSIS — I10 Essential (primary) hypertension: Secondary | ICD-10-CM | POA: Diagnosis not present

## 2023-07-10 DIAGNOSIS — Z4803 Encounter for change or removal of drains: Secondary | ICD-10-CM | POA: Diagnosis not present

## 2023-07-10 DIAGNOSIS — C519 Malignant neoplasm of vulva, unspecified: Secondary | ICD-10-CM | POA: Diagnosis not present

## 2023-07-10 DIAGNOSIS — F411 Generalized anxiety disorder: Secondary | ICD-10-CM | POA: Diagnosis not present

## 2023-07-10 DIAGNOSIS — D630 Anemia in neoplastic disease: Secondary | ICD-10-CM | POA: Diagnosis not present

## 2023-07-10 DIAGNOSIS — Z483 Aftercare following surgery for neoplasm: Secondary | ICD-10-CM | POA: Diagnosis not present

## 2023-07-10 NOTE — Progress Notes (Unsigned)
 Gynecologic Oncology Post-operative visit  07/11/2023  Reason for Visit: Postop follow up, drain check, foley assessment/eval for possible removal  Treatment History: Oncology History  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)  06/08/2021 Initial Diagnosis   Breast cancer in female [facility])   07/23/2021 Genetic Testing   Negative hereditary cancer genetic testing: no pathogenic variants detected in [facility] BRCAPlus Panel. Report date is July 23, 2021.  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  The report date is Jul 26, 2021.  The BRCAplus panel offered by [employer] and includes sequencing and deletion/duplication analysis for the following 8 genes: ATM, BRCA1, BRCA2, CDH1, CHEK2, PALB2, PTEN, and TP53.  Results of pan-cancer panel pending.   The CancerNext-Expanded gene panel offered by [facility] and includes sequencing and rearrangement analysis for the following 77 genes: AIP, ALK, APC*, ATM*, AXIN2, BAP1, BARD1, BLM, BMPR1A, BRCA1*, BRCA2*, BRIP1*, CDC73, CDH1*, CDK4, CDKN1B, CDKN2A, CHEK2*, CTNNA1, DICER1, FANCC, FH, FLCN, GALNT12, KIF1B, LZTR1, MAX, MEN1, MET, MLH1*, MSH2*, MSH3, MSH6*, MUTYH*, NBN, NF1*, NF2, NTHL1, PALB2*, PHOX2B, PMS2*, POT1, PRKAR1A, PTCH1, PTEN*, RAD51C*, RAD51D*, RB1, RECQL, RET, SDHA, SDHAF2, SDHB, SDHC, SDHD, SMAD4, SMARCA4, SMARCB1, SMARCE1, STK11, SUFU, TMEM127, TP53*, TSC1, TSC2, VHL and XRCC2 (sequencing and deletion/duplication); EGFR, EGLN1, HOXB13, KIT, MITF, PDGFRA, POLD1, and POLE (sequencing only); EPCAM and GREM1 (deletion/duplication only). DNA and RNA analyses performed for * genes.    08/23/2021 Cancer Staging   Staging form: Breast, AJCC 8th Edition - Pathologic stage from 08/23/2021: Stage IA (pT2(2), pN0(sn), cM0, G1, ER+, PR+, HER2-) - Signed by [name], MD on 09/29/2021 Histopathologic type:  Lobular carcinoma, NOS Stage prefix: Initial diagnosis Method of lymph node assessment: Sentinel lymph node biopsy Nuclear grade: G1 Multigene prognostic tests performed: EndoPredict Histologic grading system: 3 grade system Residual tumor (R): R0 - None Laterality: Right Tumor size (mm): 24 Multiple tumors: Yes Number of tumors: 2 Lymph-vascular invasion (LVI): LVI not present (absent)/not identified Diagnostic confirmation: Positive histology PLUS positive immunophenotyping and/or positive genetic studies Specimen type: Excision Staged by: Managing physician Menopausal status: Postmenopausal Ki-67 (%): 5 Stage used in treatment planning: Yes National guidelines used in treatment planning: Yes Type of national guideline used in treatment planning: NCCN    The patient endorses having occasional abnormal pap smears prior to her hysterectomy in 2011 for fibroids. She denies ever having cervical biopsies or procedures done, thinks that Pap smears were just repeated. After her hysterectomy, she had a period of time where she did not get follow-up. Since establishing with her OB/GYN provider, she has been getting Pap smears which were initially normal. Her more recent Pap history is noted below.   09/21/20: ASCUS pap 09/27/2021: ASC-H pap 11/03/21: Vaginoscopy. 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 Biopsy shows squamous mucosa with mild koilocytic atypia c/w LSIL lesion. Vaginal biopsy 10/2022: HPV effect with mild dysplasia (VAIN1). Started using vaginal estrogen  Seen on 05/11/23. Reports new area within the vagina, some pruritus and pain. 1 cm area of ulceration noted along the left mid vulva with tenderness on palpation. Biopsy recommended - she preferred to come back when she had a driver.  Biopsy performed on 05/25/23, revealed  grade 2 SCC, DOI at least 1.25 mm.   06/05/23: PET shows hypermetabolic left inguinal  lymph nodes measuring up to 1.8 cm. No other hypermetabolism noted.   On 06/28/2023, she underwent partial modified radical left vulvectomy, bilateral inguinal lymphadenectomy with Dr. Wiley Hanger. Her post-operative course was overall non-complicated. She stayed in the hospital until POD 2 with improved mobility at that time. Final path returned with: A. LYMPH NODE, LEFT INGUINAL, RESECTION:  Metastatic carcinoma in three of four lymph nodes (3/4).  Largest metastasis is 3 cm.  Extensive extranodal tumor.   B. LYMPH NODE, RIGHT INGUINAL, RESECTION:  Two lymph nodes negative for metastatic carcinoma (0/2).   C. VULVA, LEFT STITCH AT 12, VULVECTOMY:  Invasive squamous cell carcinoma associated with squamous cell carcinoma in situ, 3 cm.  Greatest depth of invasion is 5 mm.  Carcinoma in situ focally involves the 6:00 margin.  Invasive carcinoma focally less than 1 mm from 6:00 margin.  Lymphovascular space involvement by carcinoma.  See oncology table and comment.   D. LATERAL DEEP MARGIN:  Benign adipose tissue and connective tissue.  Negative for carcinoma.  Final lateral deep margin negative for carcinoma.   Interval History:     Patient presents today to the office for continued postoperative follow-up.  She reports she is doing better.  She is tolerating her diet.  Bowels functioning without difficulty.  She has had good output from the foley with clear yellow urine.  No fever or chills reported.  Having normal bowel movements without constipation.  No lower extremity edema or pain reported.  She feels like the vulvar swelling has decreased.  She reports irritation from the catheter rubbing on her leg.  The right JP drain has been draining minimal amount of orange serous drainage.  Left drain is no longer draining at the site with a mild redness at the insertion site has slightly decreased and not  changed.  She has some drainage with no odor from the vulvar incision with some mild bleeding at times. She tolerated bactrim prescribed at her last visit without difficulty.  Drain output:  06/30/2023: left 70 cc right  90 cc  07/01/2023: left 130 cc right 110 cc 07/02/2023: left 70 cc right 35 cc 07/03/2023: left 145 cc right 40 cc 07/04/2023: left 85 cc right 30 cc  07/05/2023: left 50 cc right 15 cc 07/06/2023: left 75 cc right 15 cc 07/07/2023: left 40 cc right 10 cc 07/08/2023: left 42 cc right 10 cc 07/09/2023: left 37 cc right 6 cc 07/10/2023: left 36 cc right 8 cc 07/11/2023: left 12 cc right 1 cc  Past Medical/Surgical History: Past Medical History:  Diagnosis Date   Anemia    Anxiety    Cancer (HCC)    right breast ILC   Complication of anesthesia    difficulty waking up   Depression    Diverticulitis    Family history of breast cancer    Family history of ovarian cancer    Family history of prostate cancer    Fibromyalgia    GERD (gastroesophageal reflux disease)    Heart murmur    History of colonic polyps    History of hiatal hernia    Hyperlipidemia    Hypertension    IBS (irritable bowel syndrome)    Ischemic colitis (HCC) 03/2012   Osteoporosis    Pre-diabetes    RLS (restless legs syndrome)    Sinusitis    Sleep apnea    CPAP nightly    Past Surgical History:  Procedure Laterality Date   BREAST LUMPECTOMY WITH RADIOACTIVE SEED LOCALIZATION  Right 08/04/2021   Procedure: RIGHT BREAST LUMPECTOMY WITH RADIOACTIVE SEED LOCALIZATION;  Surgeon: Harriette Bouillon, MD;  Location: [facility];  Service: General;  Laterality: Right;   CHOLECYSTECTOMY  2004   COLONOSCOPY WITH PROPOFOL N/A 04/18/2017   Procedure: COLONOSCOPY WITH PROPOFOL;  Surgeon: Charna Elizabeth, MD;  Location: [facility];  Service: Endoscopy;  Laterality: N/A;   ESOPHAGOGASTRODUODENOSCOPY (EGD) WITH PROPOFOL N/A 04/18/2017   Procedure: ESOPHAGOGASTRODUODENOSCOPY (EGD) WITH PROPOFOL;  Surgeon: Charna Elizabeth, MD;   Location: [facility];  Service: Endoscopy;  Laterality: N/A;   EXAM UNDER ANESTHESIA, PELVIC N/A 06/28/2023   Procedure: EXAM UNDER ANESTHESIA, PELVIC;  Surgeon: Carver Fila, MD;  Location: [facility];  Service: Gynecology;  Laterality: N/A;   FLEXIBLE SIGMOIDOSCOPY  04/20/2012   Procedure: FLEXIBLE SIGMOIDOSCOPY;  Surgeon: Theda Belfast, MD;  Location: [facility];  Service: Endoscopy;  Laterality: N/A;   INGUINAL LYMPHADENECTOMY N/A 06/28/2023   Procedure: LYMPHADENECTOMY, INGUINAL, OPEN;  Surgeon: Carver Fila, MD;  Location: [facility];  Service: Gynecology;  Laterality: N/A;   NISSEN FUNDOPLICATION  2011   PARAESOPHAGEAL HERNIA REPAIR  09/11/2009   and Nissen fundoplication   RADICAL VULVECTOMY N/A 06/28/2023   Procedure: VULVECTOMY, RADICAL;  Surgeon: Carver Fila, MD;  Location: [facility];  Service: Gynecology;  Laterality: N/A;  Possible skin flap   RE-EXCISION OF BREAST LUMPECTOMY Right 08/18/2021   Procedure: RE-EXCISION RIGHT BREAST LUMPECTOMY;  Surgeon: Harriette Bouillon, MD;  Location: [facility];  Service: General;  Laterality: Right;   SENTINEL NODE BIOPSY N/A 08/04/2021   Procedure: SENTINEL NODE BIOPSY;  Surgeon: Harriette Bouillon, MD;  Location: [facility];  Service: General;  Laterality: N/A;   TONSILLECTOMY  1960's   TOTAL ABDOMINAL HYSTERECTOMY  2001   w/ BSO    Family History  Problem Relation Age of Onset   Colon cancer Mother 26   Alzheimer's disease Mother    Heart attack Father    Cancer Maternal Aunt        x2   Alzheimer's disease Maternal Aunt    Ovarian cancer Maternal Aunt    Prostate cancer Maternal Uncle    Kidney disease Maternal Uncle    Atrial fibrillation Maternal Uncle    Stroke Paternal Uncle    Aneurysm Paternal Grandmother        brain   Stroke Paternal Grandfather    Prostate cancer Cousin        paternal first cousin   Prostate cancer Cousin        paternal first cousin   Esophageal cancer Cousin        maternal first  cousin   Breast cancer Cousin        DCIS, maternal first cousin   Ovarian cancer Cousin        maternal first cousin   Rheum arthritis Other    Lung disease Neg Hx     Social History   Socioeconomic History   Marital status: Widowed    Spouse name: Fayrene Fearing   Number of children: 1   Years of education: Not on file   Highest education level: Some college, no degree  Occupational History   Occupation: disabled  Tobacco Use   Smoking status: Never   Smokeless tobacco: Never   Tobacco comments:    brief exposure through her husband  Vaping Use   Vaping status: Never Used  Substance and Sexual Activity   Alcohol use: Never    Alcohol/week: 0.0 standard drinks of alcohol   Drug  use: Never   Sexual activity: Not Currently  Other Topics Concern   Not on file  Social History Narrative   From [place] originally. Previously lived in [place]. Previously worked at [employer] also in a [employer]. Has also worked as a Diplomatic Services operational officer for [employer]. No pets currently. No bird exposure. No known mold in her current home.    Lives with husband   Caffeine- coffee 2 c daily   Social Drivers of Health   Financial Resource Strain: Low Risk  (02/01/2022)   Overall Financial Resource Strain (CARDIA)    Difficulty of Paying Living Expenses: Not hard at all  Food Insecurity: No Food Insecurity (06/28/2023)   Hunger Vital Sign    Worried About Running Out of Food in the Last Year: Never true    Ran Out of Food in the Last Year: Never true  Transportation Needs: No Transportation Needs (06/28/2023)   PRAPARE - Administrator, Civil Service (Medical): No    Lack of Transportation (Non-Medical): No  Physical Activity: Not on file  Stress: Not on file  Social Connections: Moderately Isolated (06/28/2023)   Social Connection and Isolation Panel [NHANES]    Frequency of Communication with Friends and Family: More than three times a week    Frequency of Social Gatherings with Friends and  Family: Twice a week    Attends Religious Services: More than 4 times per year    Active Member of [employer] or Organizations: No    Attends Banker Meetings: Never    Marital Status: Widowed    Current Medications:  Current Outpatient Medications:    acetaminophen (TYLENOL) 650 MG CR tablet, 1,300 mg in the morning and at bedtime., Disp: , Rfl:    ALPRAZolam (XANAX) 0.25 MG tablet, Take 0.25 mg by mouth at bedtime as needed for anxiety., Disp: , Rfl:    amLODipine (NORVASC) 5 MG tablet, Take 5 mg by mouth daily., Disp: , Rfl:    Ascorbic Acid (VITAMIN C) 500 MG CHEW, Chew 500 mg by mouth daily., Disp: , Rfl:    aspirin EC 81 MG tablet, Take 81 mg by mouth at bedtime. Swallow whole., Disp: , Rfl:    atenolol (TENORMIN) 50 MG tablet, Take 75 mg by mouth daily. , Disp: , Rfl:    benazepril (LOTENSIN) 10 MG tablet, Take 10 mg by mouth daily., Disp: , Rfl:    benzonatate (TESSALON) 100 MG capsule, Take 100-200 mg by mouth at bedtime., Disp: , Rfl:    Biotin 1000 MCG tablet, Take 1,000 mcg by mouth daily., Disp: , Rfl:    Calcium Citrate-Vitamin D (CITRACAL + D PO), Take 1 tablet by mouth daily., Disp: , Rfl:    cetirizine (ZYRTEC) 10 MG tablet, Take 10 mg by mouth at bedtime., Disp: , Rfl:    Cholecalciferol (VITAMIN D) 50 MCG (2000 UT) tablet, Take 2,000 Units by mouth daily., Disp: , Rfl:    cyanocobalamin 1000 MCG tablet, Take 1,000 mcg by mouth daily., Disp: , Rfl:    diclofenac sodium (VOLTAREN) 1 % GEL, Apply 1 application  topically daily as needed (for pain)., Disp: , Rfl:    diphenhydrAMINE (BENADRYL) 25 MG tablet, Take 25 mg by mouth every 6 (six) hours as needed for allergies., Disp: , Rfl:    diphenoxylate-atropine (LOMOTIL) 2.5-0.025 MG tablet, Take 2 tablets by mouth 4 (four) times daily as needed for diarrhea or loose stools., Disp: , Rfl:    DULoxetine (CYMBALTA) 60 MG capsule,  Take 120 mg by mouth at bedtime., Disp: , Rfl:    EPINEPHrine (EPIPEN 2-PAK) 0.3 mg/0.3 mL  IJ SOAJ injection, Inject 0.3 mg into the muscle as needed for anaphylaxis., Disp: , Rfl:    [Paused] estradiol (ESTRACE VAGINAL) 0.1 MG/GM vaginal cream, Place 1 Applicatorful vaginally 3 (three) times a week., Disp: 42.5 g, Rfl: 12   famotidine (PEPCID) 20 MG tablet, Take 20 mg by mouth at bedtime., Disp: , Rfl:    fexofenadine (ALLEGRA) 180 MG tablet, Take 180 mg by mouth in the morning., Disp: , Rfl:    fluticasone (FLONASE) 50 MCG/ACT nasal spray, Place 1 spray into both nostrils daily., Disp: , Rfl:    lidocaine (XYLOCAINE) 5 % ointment, Apply 1 Application topically 2 (two) times daily as needed for moderate pain (pain score 4-6) (to the vulva). Apply to vulva as needed for discomfort, Disp: 35.44 g, Rfl: 3   NON FORMULARY, Pt uses a c-pap nightly, Disp: , Rfl:    Nutritional Supplements (JUICE PLUS FIBRE PO), Take 2 each by mouth daily. Fruits and Vegetables, Disp: , Rfl:    Omega-3 Fatty Acids (FISH OIL) 1000 MG CAPS, Take 1,000 mg by mouth daily. , Disp: , Rfl:    ondansetron (ZOFRAN) 4 MG tablet, Take 4 mg by mouth every 8 (eight) hours as needed for nausea or vomiting., Disp: , Rfl:    oxyCODONE (OXY IR/ROXICODONE) 5 MG immediate release tablet, Take 1 tablet (5 mg total) by mouth every 4 (four) hours as needed for severe pain (pain score 7-10). For AFTER surgery only, do not take and drive, Disp: 10 tablet, Rfl: 0   pantoprazole (PROTONIX) 40 MG tablet, Take 40 mg by mouth every evening., Disp: , Rfl:    Phenylephrine-APAP-Guaifenesin (TYLENOL SINUS SEVERE PO), Take 2 tablets by mouth daily as needed (drainage)., Disp: , Rfl:    polyvinyl alcohol (LIQUIFILM TEARS) 1.4 % ophthalmic solution, Place 1 drop into both eyes 5 (five) times daily., Disp: , Rfl:    Probiotic Product (PROBIOTIC & ACIDOPHILUS EX ST PO), Take 1 capsule by mouth daily. Ultra Flora Plus Capsules, Disp: , Rfl:    Propylene Glycol (SYSTANE COMPLETE) 0.6 % SOLN, Place 1 drop into both eyes in the morning and at bedtime.,  Disp: , Rfl:    rOPINIRole (REQUIP) 0.5 MG tablet, Take 0.5 mg by mouth at bedtime., Disp: , Rfl:    senna-docusate (SENOKOT-S) 8.6-50 MG tablet, Take 2 tablets by mouth at bedtime. For AFTER surgery, do not take if having diarrhea, Disp: 30 tablet, Rfl: 0   simvastatin (ZOCOR) 10 MG tablet, Take 10 mg by mouth at bedtime.  , Disp: , Rfl:    sucralfate (CARAFATE) 1 GM/10ML suspension, Take 1 g by mouth 2 (two) times daily., Disp: , Rfl:    sulfamethoxazole-trimethoprim (BACTRIM DS) 800-160 MG tablet, Take 1 tablet by mouth 2 (two) times daily., Disp: 14 tablet, Rfl: 0   traZODone (DESYREL) 50 MG tablet, Take 50 mg by mouth at bedtime., Disp: , Rfl:    vitamin E 400 UNIT capsule, Take 400 Units by mouth daily.  , Disp: , Rfl:    White Petrolatum-Mineral Oil (REFRESH P.M. OP), Place 1 Application into both eyes at bedtime., Disp: , Rfl:   Review of Systems: See interval. Additional review negative.  Physical Exam: BP (!) 111/51 (BP Location: Right Arm, Patient Position: Sitting)   Pulse (!) 53   Temp 97.9 F (36.6 C) (Oral)   Resp 16   Wt 214  lb (97.1 kg)   SpO2 99%   BMI 37.91 kg/m  General: Alert, oriented, no acute distress. HEENT: Normal. Sclera anicteric. Chest: Clear to auscultation bilaterally.  No wheezes or rhonchi. Cardiovascular: Regular rate and rhythm, no murmurs. Extremities: Grossly normal range of motion.  Warm, well perfused.  No edema bilaterally. Bilateral inguinal incisions with dermabond are intact with less surrounding edema. No erythema or drainage from the incisions. The left JP drain with serous drainage. Drain stripped with serous output in the bulb. At the insertion site for the left drain, the surrounding erythema has slightly decreased and is without significant changes. Right drain with minimal serous output, no erythema or drainage at insertion site. No fluctuance around the drains or incisions. Moderate mons edema with resolving ecchymosis. The mons remains  edematous, less than last assessment, ecchymosis from last assessment resolved. The labia are edematous but less than last assessment. There is fibrinous drainage coming from the incision that has opened superficially  . No significant surrounding erythema. Foley in place. Urethra is not obstructed due to edema. After evaluation, right JP drain was removed without difficulty.     Laboratory & Radiologic Studies: See above  Assessment & Plan: Doris Reyes is a 73 y.o. woman with Stage IIIC SCC of the vulva s/p partial modified radical left vulvectomy, bilateral inguinal lymphadenectomy with Dr. Wiley Hanger on 06/28/2023. She presents today for post-operative follow up, incision check, drain/foley assessment. Vulvar care discussed. Follow up has been made. Patient is advised to call for any new, worsening symptoms to be seen sooner. She would like to hold off on an appointment this Friday at this time and is advised our office will call to check in. Vulvar care discussed. She is advised to continue monitoring output. All questions answered at end of visit. Advised to call for any needs or concerns.   Vira Grieves NP [facility] On[facility]

## 2023-07-11 ENCOUNTER — Inpatient Hospital Stay: Admitting: Gynecologic Oncology

## 2023-07-11 VITALS — BP 111/51 | HR 53 | Temp 97.9°F | Resp 16 | Wt 214.0 lb

## 2023-07-11 DIAGNOSIS — Z9079 Acquired absence of other genital organ(s): Secondary | ICD-10-CM

## 2023-07-11 DIAGNOSIS — C519 Malignant neoplasm of vulva, unspecified: Secondary | ICD-10-CM

## 2023-07-11 DIAGNOSIS — T8189XA Other complications of procedures, not elsewhere classified, initial encounter: Secondary | ICD-10-CM

## 2023-07-11 DIAGNOSIS — Z466 Encounter for fitting and adjustment of urinary device: Secondary | ICD-10-CM

## 2023-07-11 DIAGNOSIS — C774 Secondary and unspecified malignant neoplasm of inguinal and lower limb lymph nodes: Secondary | ICD-10-CM

## 2023-07-11 NOTE — Patient Instructions (Signed)
 Today we removed the foley catheter and the right JP drain.   Continue monitoring the output from the left JP drain. Please call the office when the drainage is 15 cc or mL or less in a 24 hour period and we can get this removed.   The incision on the vulva has opened superficially which happens often. This will heal from the inside out. No evidence of infection on today's exam.   There is a discharge called fibrinous drainage which can be seen with the healing process. In order to help cleanse the incision/control drainage, plan on performing sitz baths at least 3 times daily and continue use of peri bottle several times a day.  If you have trouble urinating, please call the office. After 5 pm, please call [phone] and let the on call nurse know you had recent surgery and your foley catheter removed today and you need the GYN Oncologist on call paged.   If you need to slightly separate the labia during urinating you can do this. If you notice urine makes the open area (incision) burn, you can always use the peri bottle at the same time as urination or void in the sitz bath and clean thoroughly after.   We will call on Friday morning to check in but please call the office before that time for any needs, decreased drainage from the JP drain.   Please call for any needs, new symptoms such as fever/increased drainage/feeling poorly.

## 2023-07-13 ENCOUNTER — Telehealth: Payer: Self-pay

## 2023-07-13 NOTE — Telephone Encounter (Signed)
 Doris Reyes called stating she was seen on Tuesday by Vira Grieves NP, Doris Reyes is calling to give an update of left JP drain output.  Tuesday PM: 12ml,  Wednesday AM:39ml,  Wednesday PM 10ml, Thursday AM 23M  Pt scheduled with Vira Grieves on Friday 4/18 at 1:00 for JP drain removal (according to last office note)

## 2023-07-14 ENCOUNTER — Telehealth: Payer: Self-pay | Admitting: Surgery

## 2023-07-14 ENCOUNTER — Inpatient Hospital Stay

## 2023-07-14 ENCOUNTER — Inpatient Hospital Stay: Admitting: Gynecologic Oncology

## 2023-07-14 VITALS — BP 114/70 | HR 57 | Temp 97.5°F | Resp 16 | Ht 63.0 in | Wt 210.4 lb

## 2023-07-14 DIAGNOSIS — C519 Malignant neoplasm of vulva, unspecified: Secondary | ICD-10-CM

## 2023-07-14 DIAGNOSIS — Z9079 Acquired absence of other genital organ(s): Secondary | ICD-10-CM | POA: Diagnosis not present

## 2023-07-14 DIAGNOSIS — M797 Fibromyalgia: Secondary | ICD-10-CM | POA: Diagnosis not present

## 2023-07-14 DIAGNOSIS — T8189XA Other complications of procedures, not elsewhere classified, initial encounter: Secondary | ICD-10-CM

## 2023-07-14 DIAGNOSIS — N762 Acute vulvitis: Secondary | ICD-10-CM

## 2023-07-14 DIAGNOSIS — K219 Gastro-esophageal reflux disease without esophagitis: Secondary | ICD-10-CM | POA: Diagnosis not present

## 2023-07-14 DIAGNOSIS — C774 Secondary and unspecified malignant neoplasm of inguinal and lower limb lymph nodes: Secondary | ICD-10-CM | POA: Diagnosis not present

## 2023-07-14 DIAGNOSIS — K449 Diaphragmatic hernia without obstruction or gangrene: Secondary | ICD-10-CM | POA: Diagnosis not present

## 2023-07-14 DIAGNOSIS — N9089 Other specified noninflammatory disorders of vulva and perineum: Secondary | ICD-10-CM | POA: Insufficient documentation

## 2023-07-14 LAB — CBC WITH DIFFERENTIAL (CANCER CENTER ONLY)
Abs Immature Granulocytes: 0.03 10*3/uL (ref 0.00–0.07)
Basophils Absolute: 0 10*3/uL (ref 0.0–0.1)
Basophils Relative: 1 %
Eosinophils Absolute: 0.3 10*3/uL (ref 0.0–0.5)
Eosinophils Relative: 4 %
HCT: 38.2 % (ref 36.0–46.0)
Hemoglobin: 12.8 g/dL (ref 12.0–15.0)
Immature Granulocytes: 0 %
Lymphocytes Relative: 21 %
Lymphs Abs: 1.6 10*3/uL (ref 0.7–4.0)
MCH: 28.4 pg (ref 26.0–34.0)
MCHC: 33.5 g/dL (ref 30.0–36.0)
MCV: 84.9 fL (ref 80.0–100.0)
Monocytes Absolute: 0.8 10*3/uL (ref 0.1–1.0)
Monocytes Relative: 10 %
Neutro Abs: 5.2 10*3/uL (ref 1.7–7.7)
Neutrophils Relative %: 64 %
Platelet Count: 309 10*3/uL (ref 150–400)
RBC: 4.5 MIL/uL (ref 3.87–5.11)
RDW: 13.2 % (ref 11.5–15.5)
WBC Count: 7.9 10*3/uL (ref 4.0–10.5)
nRBC: 0 % (ref 0.0–0.2)

## 2023-07-14 MED ORDER — SULFAMETHOXAZOLE-TRIMETHOPRIM 800-160 MG PO TABS: 1.0000 | ORAL_TABLET | Freq: Two times a day (BID) | ORAL | 0 refills | Status: DC

## 2023-07-14 NOTE — Progress Notes (Signed)
 Gynecologic Oncology Post-operative visit  07/14/2023  Reason for Visit: Postop follow up, Left JP drain check/possible removal  Treatment History: Oncology History  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)  06/08/2021 Initial Diagnosis   Breast cancer in female [facility])   07/23/2021 Genetic Testing   Negative hereditary cancer genetic testing: no pathogenic variants detected in [facility] BRCAPlus Panel. Report date is July 23, 2021.  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  The report date is Jul 26, 2021.  The BRCAplus panel offered by [employer] and includes sequencing and deletion/duplication analysis for the following 8 genes: ATM, BRCA1, BRCA2, CDH1, CHEK2, PALB2, PTEN, and TP53.  Results of pan-cancer panel pending.   The CancerNext-Expanded gene panel offered by [facility] and includes sequencing and rearrangement analysis for the following 77 genes: AIP, ALK, APC*, ATM*, AXIN2, BAP1, BARD1, BLM, BMPR1A, BRCA1*, BRCA2*, BRIP1*, CDC73, CDH1*, CDK4, CDKN1B, CDKN2A, CHEK2*, CTNNA1, DICER1, FANCC, FH, FLCN, GALNT12, KIF1B, LZTR1, MAX, MEN1, MET, MLH1*, MSH2*, MSH3, MSH6*, MUTYH*, NBN, NF1*, NF2, NTHL1, PALB2*, PHOX2B, PMS2*, POT1, PRKAR1A, PTCH1, PTEN*, RAD51C*, RAD51D*, RB1, RECQL, RET, SDHA, SDHAF2, SDHB, SDHC, SDHD, SMAD4, SMARCA4, SMARCB1, SMARCE1, STK11, SUFU, TMEM127, TP53*, TSC1, TSC2, VHL and XRCC2 (sequencing and deletion/duplication); EGFR, EGLN1, HOXB13, KIT, MITF, PDGFRA, POLD1, and POLE (sequencing only); EPCAM and GREM1 (deletion/duplication only). DNA and RNA analyses performed for * genes.    08/23/2021 Cancer Staging   Staging form: Breast, AJCC 8th Edition - Pathologic stage from 08/23/2021: Stage IA (pT2(2), pN0(sn), cM0, G1, ER+, PR+, HER2-) - Signed by Cornelius Wanda DEL, MD on 09/29/2021 Histopathologic type: Lobular carcinoma,  NOS Stage prefix: Initial diagnosis Method of lymph node assessment: Sentinel lymph node biopsy Nuclear grade: G1 Multigene prognostic tests performed: EndoPredict Histologic grading system: 3 grade system Residual tumor (R): R0 - None Laterality: Right Tumor size (mm): 24 Multiple tumors: Yes Number of tumors: 2 Lymph-vascular invasion (LVI): LVI not present (absent)/not identified Diagnostic confirmation: Positive histology PLUS positive immunophenotyping and/or positive genetic studies Specimen type: Excision Staged by: Managing physician Menopausal status: Postmenopausal Ki-67 (%): 5 Stage used in treatment planning: Yes National guidelines used in treatment planning: Yes Type of national guideline used in treatment planning: NCCN    The patient endorsed having occasional abnormal pap smears prior to her hysterectomy in 2011 for fibroids. She denied ever having cervical biopsies or procedures done, thinks that Pap smears were just repeated. After her hysterectomy, she had a period of time where she did not get follow-up. Since establishing with her OB/GYN provider, she has been getting Pap smears which were initially normal. Her more recent Pap history is noted below.   09/21/20: ASCUS pap 09/27/2021: ASC-H pap 11/03/21: Vaginoscopy. 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 Biopsy shows squamous mucosa with mild koilocytic atypia c/w LSIL lesion. Vaginal biopsy 10/2022: HPV effect with mild dysplasia (VAIN1). Started using vaginal estrogen  Seen on 05/11/23. Reports new area within the vagina, some pruritus and pain. 1 cm area of ulceration noted along the left mid vulva with tenderness on palpation. Biopsy recommended - she preferred to come back when she had a driver.  Biopsy performed on 05/25/23, revealed grade 2 SCC, DOI  at least 1.25 mm.   06/05/23: PET shows hypermetabolic left inguinal lymph nodes  measuring up to 1.8 cm. No other hypermetabolism noted.   On 06/28/2023, she underwent partial modified radical left vulvectomy, bilateral inguinal lymphadenectomy with Dr. Comer Dollar. Her post-operative course was overall non-complicated. She stayed in the hospital until POD 2 with improved mobility at that time. Final path returned with: A. LYMPH NODE, LEFT INGUINAL, RESECTION:  Metastatic carcinoma in three of four lymph nodes (3/4).  Largest metastasis is 3 cm.  Extensive extranodal tumor.   B. LYMPH NODE, RIGHT INGUINAL, RESECTION:  Two lymph nodes negative for metastatic carcinoma (0/2).   C. VULVA, LEFT STITCH AT 12, VULVECTOMY:  Invasive squamous cell carcinoma associated with squamous cell carcinoma in situ, 3 cm.  Greatest depth of invasion is 5 mm.  Carcinoma in situ focally involves the 6:00 margin.  Invasive carcinoma focally less than 1 mm from 6:00 margin.  Lymphovascular space involvement by carcinoma.  See oncology table and comment.   D. LATERAL DEEP MARGIN:  Benign adipose tissue and connective tissue.  Negative for carcinoma.  Final lateral deep margin negative for carcinoma.   Interval History:     Patient presents today to the office for continued postoperative follow-up and removal of the left JP drain. She continues to tolerate her diet. Voiding without difficulty with no dysuria since foley removal earlier in the week. Bowels continue to move without difficulty. She has been taking tylenol  several times a day for discomfort. No fever or chills reported. Vulvar/groin swelling has been present, not significantly changed, difficult to assess at home given location. She has been using the sitz baths. Continues to have mild vulvar drainage.   Drain output:  06/30/2023: left 70 cc right  90 cc  07/01/2023: left 130 cc right 110 cc 07/02/2023: left 70 cc right 35 cc 07/03/2023: left 145 cc  right 40 cc 07/04/2023: left 85 cc right 30 cc  07/05/2023: left 50 cc right 15 cc 07/06/2023: left 75 cc right 15 cc 07/07/2023: left 40 cc right 10 cc 07/08/2023: left 42 cc right 10 cc 07/09/2023: left 37 cc right 6 cc 07/10/2023: left 36 cc right 8 cc 07/11/2023: left 12 cc right 1 cc  Right drain removed on 07/11/2023  07/12/2023: left 15 cc 07/13/2023: left 15 cc 07/14/2023: left 5 cc  Past Medical/Surgical History: Past Medical History:  Diagnosis Date   Anemia    Anxiety    Cancer (HCC)    right breast ILC   Complication of anesthesia    difficulty waking up   Depression    Diverticulitis    Family history of breast cancer    Family history of ovarian cancer    Family history of prostate cancer    Fibromyalgia    GERD (gastroesophageal reflux disease)    Heart murmur    History of colonic polyps    History of hiatal hernia    Hyperlipidemia    Hypertension    IBS (irritable bowel syndrome)    Ischemic colitis (HCC) 03/2012   Osteoporosis    Pre-diabetes    RLS (restless legs syndrome)    Sinusitis    Sleep apnea    CPAP nightly    Past Surgical History:  Procedure Laterality Date   BREAST LUMPECTOMY WITH RADIOACTIVE SEED LOCALIZATION Right 08/04/2021   Procedure: RIGHT BREAST LUMPECTOMY WITH RADIOACTIVE SEED LOCALIZATION;  Surgeon: Vanderbilt Ned, MD;  Location: [facility];  Service: General;  Laterality: Right;   CHOLECYSTECTOMY  2004   COLONOSCOPY WITH PROPOFOL  N/A 04/18/2017   Procedure: COLONOSCOPY WITH  PROPOFOL ;  Surgeon: Kristie Lamprey, MD;  Location: [facility];  Service: Endoscopy;  Laterality: N/A;   ESOPHAGOGASTRODUODENOSCOPY (EGD) WITH PROPOFOL  N/A 04/18/2017   Procedure: ESOPHAGOGASTRODUODENOSCOPY (EGD) WITH PROPOFOL ;  Surgeon: Kristie Lamprey, MD;  Location: [facility];  Service: Endoscopy;  Laterality: N/A;   EXAM UNDER ANESTHESIA, PELVIC N/A 06/28/2023   Procedure: EXAM UNDER ANESTHESIA, PELVIC;  Surgeon: Viktoria Comer SAUNDERS, MD;  Location: [facility];   Service: Gynecology;  Laterality: N/A;   FLEXIBLE SIGMOIDOSCOPY  04/20/2012   Procedure: FLEXIBLE SIGMOIDOSCOPY;  Surgeon: Belvie JONETTA Just, MD;  Location: [facility];  Service: Endoscopy;  Laterality: N/A;   INGUINAL LYMPHADENECTOMY N/A 06/28/2023   Procedure: LYMPHADENECTOMY, INGUINAL, OPEN;  Surgeon: Viktoria Comer SAUNDERS, MD;  Location: [facility];  Service: Gynecology;  Laterality: N/A;   NISSEN FUNDOPLICATION  2011   PARAESOPHAGEAL HERNIA REPAIR  09/11/2009   and Nissen fundoplication   RADICAL VULVECTOMY N/A 06/28/2023   Procedure: VULVECTOMY, RADICAL;  Surgeon: Viktoria Comer SAUNDERS, MD;  Location: [facility];  Service: Gynecology;  Laterality: N/A;  Possible skin flap   RE-EXCISION OF BREAST LUMPECTOMY Right 08/18/2021   Procedure: RE-EXCISION RIGHT BREAST LUMPECTOMY;  Surgeon: Vanderbilt Ned, MD;  Location: [facility];  Service: General;  Laterality: Right;   SENTINEL NODE BIOPSY N/A 08/04/2021   Procedure: SENTINEL NODE BIOPSY;  Surgeon: Vanderbilt Ned, MD;  Location: [facility];  Service: General;  Laterality: N/A;   TONSILLECTOMY  1960's   TOTAL ABDOMINAL HYSTERECTOMY  2001   w/ BSO    Family History  Problem Relation Age of Onset   Colon cancer Mother 67   Alzheimer's disease Mother    Heart attack Father    Cancer Maternal Aunt        x2   Alzheimer's disease Maternal Aunt    Ovarian cancer Maternal Aunt    Prostate cancer Maternal Uncle    Kidney disease Maternal Uncle    Atrial fibrillation Maternal Uncle    Stroke Paternal Uncle    Aneurysm Paternal Grandmother        brain   Stroke Paternal Grandfather    Prostate cancer Cousin        paternal first cousin   Prostate cancer Cousin        paternal first cousin   Esophageal cancer Cousin        maternal first cousin   Breast cancer Cousin        DCIS, maternal first cousin   Ovarian cancer Cousin        maternal first cousin   Rheum arthritis Other    Lung disease Neg Hx     Social History    Socioeconomic History   Marital status: Widowed    Spouse name: Lynwood   Number of children: 1   Years of education: Not on file   Highest education level: Some college, no degree  Occupational History   Occupation: disabled  Tobacco Use   Smoking status: Never   Smokeless tobacco: Never   Tobacco comments:    brief exposure through her husband  Vaping Use   Vaping status: Never Used  Substance and Sexual Activity   Alcohol use: Never    Alcohol/week: 0.0 standard drinks of alcohol   Drug use: Never   Sexual activity: Not Currently  Other Topics Concern   Not on file  Social History Narrative   From [place] originally. Previously lived in [place]. Previously worked at [employer] also in a [employer]. Has also worked  as a diplomatic services operational officer for [employer]. No pets currently. No bird exposure. No known mold in her current home.    Lives with husband   Caffeine- coffee 2 c daily   Social Drivers of Health   Financial Resource Strain: Low Risk  (02/01/2022)   Overall Financial Resource Strain (CARDIA)    Difficulty of Paying Living Expenses: Not hard at all  Food Insecurity: No Food Insecurity (06/28/2023)   Hunger Vital Sign    Worried About Running Out of Food in the Last Year: Never true    Ran Out of Food in the Last Year: Never true  Transportation Needs: No Transportation Needs (06/28/2023)   PRAPARE - Administrator, Civil Service (Medical): No    Lack of Transportation (Non-Medical): No  Physical Activity: Not on file  Stress: Not on file  Social Connections: Moderately Isolated (06/28/2023)   Social Connection and Isolation Panel [NHANES]    Frequency of Communication with Friends and Family: More than three times a week    Frequency of Social Gatherings with Friends and Family: Twice a week    Attends Religious Services: More than 4 times per year    Active Member of [employer] or Organizations: No    Attends Banker Meetings: Never    Marital  Status: Widowed    Current Medications:  Current Outpatient Medications:    acetaminophen  (TYLENOL ) 650 MG CR tablet, 1,300 mg in the morning and at bedtime., Disp: , Rfl:    ALPRAZolam  (XANAX ) 0.25 MG tablet, Take 0.25 mg by mouth at bedtime as needed for anxiety., Disp: , Rfl:    amLODipine  (NORVASC ) 5 MG tablet, Take 5 mg by mouth daily., Disp: , Rfl:    Ascorbic Acid (VITAMIN C) 500 MG CHEW, Chew 500 mg by mouth daily., Disp: , Rfl:    aspirin EC 81 MG tablet, Take 81 mg by mouth at bedtime. Swallow whole., Disp: , Rfl:    atenolol  (TENORMIN ) 50 MG tablet, Take 75 mg by mouth daily. , Disp: , Rfl:    benazepril  (LOTENSIN ) 10 MG tablet, Take 10 mg by mouth daily., Disp: , Rfl:    benzonatate (TESSALON) 100 MG capsule, Take 100-200 mg by mouth at bedtime., Disp: , Rfl:    Biotin 1000 MCG tablet, Take 1,000 mcg by mouth daily., Disp: , Rfl:    Calcium  Citrate-Vitamin D (CITRACAL + D PO), Take 1 tablet by mouth daily., Disp: , Rfl:    cetirizine (ZYRTEC) 10 MG tablet, Take 10 mg by mouth at bedtime., Disp: , Rfl:    Cholecalciferol (VITAMIN D) 50 MCG (2000 UT) tablet, Take 2,000 Units by mouth daily., Disp: , Rfl:    cyanocobalamin  1000 MCG tablet, Take 1,000 mcg by mouth daily., Disp: , Rfl:    diclofenac sodium (VOLTAREN) 1 % GEL, Apply 1 application  topically daily as needed (for pain)., Disp: , Rfl:    diphenhydrAMINE  (BENADRYL ) 25 MG tablet, Take 25 mg by mouth every 6 (six) hours as needed for allergies., Disp: , Rfl:    diphenoxylate-atropine (LOMOTIL) 2.5-0.025 MG tablet, Take 2 tablets by mouth 4 (four) times daily as needed for diarrhea or loose stools., Disp: , Rfl:    DULoxetine  (CYMBALTA ) 60 MG capsule, Take 120 mg by mouth at bedtime., Disp: , Rfl:    EPINEPHrine  (EPIPEN  2-PAK) 0.3 mg/0.3 mL IJ SOAJ injection, Inject 0.3 mg into the muscle as needed for anaphylaxis., Disp: , Rfl:    [Paused] estradiol  (ESTRACE  VAGINAL) 0.1 MG/GM  vaginal cream, Place 1 Applicatorful vaginally 3  (three) times a week., Disp: 42.5 g, Rfl: 12   famotidine  (PEPCID ) 20 MG tablet, Take 20 mg by mouth at bedtime., Disp: , Rfl:    fexofenadine (ALLEGRA) 180 MG tablet, Take 180 mg by mouth in the morning., Disp: , Rfl:    fluticasone  (FLONASE ) 50 MCG/ACT nasal spray, Place 1 spray into both nostrils daily., Disp: , Rfl:    lidocaine  (XYLOCAINE ) 5 % ointment, Apply 1 Application topically 2 (two) times daily as needed for moderate pain (pain score 4-6) (to the vulva). Apply to vulva as needed for discomfort, Disp: 35.44 g, Rfl: 3   NON FORMULARY, Pt uses a c-pap nightly, Disp: , Rfl:    Nutritional Supplements (JUICE PLUS FIBRE PO), Take 2 each by mouth daily. Fruits and Vegetables, Disp: , Rfl:    Omega-3 Fatty Acids (FISH OIL) 1000 MG CAPS, Take 1,000 mg by mouth daily. , Disp: , Rfl:    ondansetron  (ZOFRAN ) 4 MG tablet, Take 4 mg by mouth every 8 (eight) hours as needed for nausea or vomiting., Disp: , Rfl:    oxyCODONE  (OXY IR/ROXICODONE ) 5 MG immediate release tablet, Take 1 tablet (5 mg total) by mouth every 4 (four) hours as needed for severe pain (pain score 7-10). For AFTER surgery only, do not take and drive, Disp: 10 tablet, Rfl: 0   pantoprazole  (PROTONIX ) 40 MG tablet, Take 40 mg by mouth every evening., Disp: , Rfl:    Phenylephrine -APAP-Guaifenesin (TYLENOL  SINUS SEVERE PO), Take 2 tablets by mouth daily as needed (drainage)., Disp: , Rfl:    polyvinyl alcohol (LIQUIFILM TEARS) 1.4 % ophthalmic solution, Place 1 drop into both eyes 5 (five) times daily., Disp: , Rfl:    Probiotic Product (PROBIOTIC & ACIDOPHILUS EX ST PO), Take 1 capsule by mouth daily. Ultra Flora Plus Capsules, Disp: , Rfl:    Propylene Glycol (SYSTANE COMPLETE) 0.6 % SOLN, Place 1 drop into both eyes in the morning and at bedtime., Disp: , Rfl:    rOPINIRole  (REQUIP ) 0.5 MG tablet, Take 0.5 mg by mouth at bedtime., Disp: , Rfl:    senna-docusate (SENOKOT-S) 8.6-50 MG tablet, Take 2 tablets by mouth at bedtime. For  AFTER surgery, do not take if having diarrhea, Disp: 30 tablet, Rfl: 0   simvastatin  (ZOCOR ) 10 MG tablet, Take 10 mg by mouth at bedtime.  , Disp: , Rfl:    sucralfate  (CARAFATE ) 1 GM/10ML suspension, Take 1 g by mouth 2 (two) times daily., Disp: , Rfl:    sulfamethoxazole -trimethoprim  (BACTRIM  DS) 800-160 MG tablet, Take 1 tablet by mouth 2 (two) times daily., Disp: 14 tablet, Rfl: 0   traZODone  (DESYREL ) 50 MG tablet, Take 50 mg by mouth at bedtime., Disp: , Rfl:    vitamin E 400 UNIT capsule, Take 400 Units by mouth daily.  , Disp: , Rfl:    White Petrolatum -Mineral Oil (REFRESH P.M. OP), Place 1 Application into both eyes at bedtime., Disp: , Rfl:   Review of Systems: See interval. Additional review negative.  Physical Exam: BP 114/70 (BP Location: Left Arm, Patient Position: Sitting)   Pulse (!) 57   Temp (!) 97.5 F (36.4 C) (Oral)   Resp 16   Ht 5' 3 (1.6 m)   Wt 210 lb 6.4 oz (95.4 kg)   SpO2 100%   BMI 37.27 kg/m  General: Alert, oriented, no acute distress. HEENT: Normal. Sclera anicteric. Chest: Breathing unlabored. Extremities: Grossly normal range of motion.  Warm, well  perfused.  No edema bilaterally. Previous right JP drain site assessed. Mild surrounding erythema at the opening has unchanged, felt to be reactive. Dry dressing reapplied. Left JP drain with minimal amount of serous drainage. Since draining minimal amount, left JP drain removed without difficulty. Dry dressing applied over the site. Bilateral groin incisions are intact. Surrounding edema. The mons is moderately edematous, more than last assessment, with demarcated erythema (new development since last visit). Erythema marked. Slightly increased warmth, skin blanching. No fluctuance around the drains or incisions. The labia are edematous and more erythematous, more since last assessment. There continues to be fibrinous drainage coming from the incision that has opened superficially. No significant surrounding  erythema around the incision itself. Urethra is not obstructed due to edema. Culture obtained.   Laboratory & Radiologic Studies: See above. Plan for CBC with diff today  Assessment & Plan: Doris Reyes is a 73 y.o. woman with Stage IIIC SCC of the vulva s/p partial modified radical left vulvectomy, bilateral inguinal lymphadenectomy with Dr. Comer Dollar on 06/28/2023. She presents today for post-operative follow up, incision check, left JP drain assessment and possible removal. Left JP drain removed today. Increased edema and new erythema of the mons and vulva on assessment today. Plan for CBC. Dr. Dollar assessed erythema/edema as well. Will plan for another course of bactrim  per Dr. Dollar and since she tolerated this well and is unable to tolerate keflex . Vulvar care discussed and she will continue with sitz baths. Patient is advised to call for any new, worsening symptoms to be seen sooner. Our office will call to check in on Monday but she is advised to call sooner if needed. All questions answered at end of visit. Advised to call for any needs or concerns. After hours contact information given in case of issues urinating, worsening erythema, feeling poorly, or other needs.  Doris Epps NP [facility] G[facility] Oncology

## 2023-07-14 NOTE — Telephone Encounter (Signed)
 Called patient to relay lab results and LVM for callback.

## 2023-07-14 NOTE — Patient Instructions (Addendum)
 We took a culture from the vulva today and will let you know the results.  We marked the redness on the upper vulva where the redness and swelling are. If you feel the redness increases past the marked line, please call the office.  There is increased swelling of the labia so please monitor for any issues urinating and call the office.  Given the redness and increased warmth, we will plan for another course of bactrim  since you tolerated this well. If things do not improve with this, please call the office so a different antibiotic can be sent in.   Symptoms to report to your health care team include FEVER, FEELING POORLY, UNABLE TO URINATE, vaginal bleeding, rectal bleeding, bloating, weight loss without effort, new and persistent pain, new and  persistent fatigue, new leg swelling, new masses (i.e., bumps in your neck or groin), new and persistent cough, new and persistent nausea and vomiting, change in bowel or bladder habits, and any other concerns.   If you have trouble urinating, please call the office. After 5 pm, please call [phone] and let the on call nurse know you had recent surgery and your foley catheter removed today and you need the GYN Oncologist on call paged.   We will plan on obtaining a CBC lab today to see if your white blood cell count is elevated and will contact you with the results.  We will contact you on Monday morning to check in. We may make you an appointment earlier in the week as well to check the redness if things have not improved.

## 2023-07-14 NOTE — Telephone Encounter (Signed)
 Patient returned call. Advised patient of her lab results and to continue to monitor swelling and redness over the weekend. Advised patient that someone from our office will call her Monday to follow up.

## 2023-07-14 NOTE — Telephone Encounter (Signed)
-----   Message from Suellyn Emory sent at 07/14/2023  2:30 PM EDT ----- Please call and let her know her CBC results. Her WBC count is at 7.9 which is within normal range. Hemoglobin and other blood levels look good. Would have her continue to monitor swelling and redness over the weekend. Our office is supposed to be reaching out first thing on Monday am (before 10 if possible since she has a bone density test).   Thank you

## 2023-07-17 ENCOUNTER — Telehealth: Payer: Self-pay | Admitting: *Deleted

## 2023-07-17 ENCOUNTER — Other Ambulatory Visit: Payer: Self-pay | Admitting: Oncology

## 2023-07-17 ENCOUNTER — Ambulatory Visit (INDEPENDENT_AMBULATORY_CARE_PROVIDER_SITE_OTHER)
Admission: RE | Admit: 2023-07-17 | Discharge: 2023-07-17 | Disposition: A | Discharge status: Home / Self Care | Payer: Medicare Other | Source: Ambulatory Visit | Attending: Oncology | Admitting: Oncology

## 2023-07-17 ENCOUNTER — Encounter: Payer: Self-pay | Admitting: Gynecologic Oncology

## 2023-07-17 DIAGNOSIS — Z780 Asymptomatic menopausal state: Secondary | ICD-10-CM | POA: Diagnosis not present

## 2023-07-17 DIAGNOSIS — M810 Age-related osteoporosis without current pathological fracture: Secondary | ICD-10-CM | POA: Diagnosis not present

## 2023-07-17 DIAGNOSIS — M8589 Other specified disorders of bone density and structure, multiple sites: Secondary | ICD-10-CM | POA: Diagnosis not present

## 2023-07-17 LAB — AEROBIC CULTURE W GRAM STAIN (SUPERFICIAL SPECIMEN): Gram Stain: NONE SEEN

## 2023-07-17 NOTE — Telephone Encounter (Signed)
-----   Message from Suellyn Emory sent at 07/17/2023 11:21 AM EDT ----- She should be on your call list for this am to check in. Please see how she is doing and how the marked redness is and how the swelling is. Make sure she is urinating well too. The culture we took was not overly helpful with just a few bacteria seen. We will continue with plan if she is doing well. ----- Message ----- From: Interface, Lab In [place] Sent: 07/14/2023   7:41 PM EDT To: Suellyn Emory, NP

## 2023-07-17 NOTE — Telephone Encounter (Signed)
Attempted to reach patient. Left voicemail requesting call back.

## 2023-07-17 NOTE — Telephone Encounter (Signed)
-----   Message from Suellyn Emory sent at 07/14/2023  2:30 PM EDT ----- Please call and let her know her CBC results. Her WBC count is at 7.9 which is within normal range. Hemoglobin and other blood levels look good. Would have her continue to monitor swelling and redness over the weekend. Our office is supposed to be reaching out first thing on Monday am (before 10 if possible since she has a bone density test).   Thank you

## 2023-07-17 NOTE — Telephone Encounter (Signed)
2nd attempt to reach patient. Left voicemail requesting call back.

## 2023-07-17 NOTE — Telephone Encounter (Signed)
 Spoke with Ms. Wages who states the redness and swelling has improved a little bit. Pt states she is urinating well without difficulty. Relayed message from Vira Grieves, NP that the urine culture we took was not overly helpful with just a few bacteria seen. We will continue with the plan if pt is doing well. Pt verbalized understanding and reminded of her follow up appt. With Dr. Orvil Bland on Friday. Pt also requested a note for her daughter to be excused from [employer]. A note was emailed to pt's daughter at [email or web address]  per patient's request.

## 2023-07-17 NOTE — Progress Notes (Signed)
 Gynecologic Oncology Multi-Disciplinary Disposition Conference Note  Date of the Conference: 07/17/2023  Patient Name: Doris Reyes  Referring Provider: Dr. Serge Dancer Primary GYN Oncologist: Dr. 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 Disposition is to radiation (both to the vulva as well as to bilateral groins with pelvic coverage) with consideration for the addition of cisplatin and pembrolizumab.  This Multidisciplinary conference took place involving physicians from Gynecologic Oncology, Medical Oncology, Radiation Oncology, Pathology, Radiology along with the Gynecologic Oncology Nurse Practitioner and Gynecologic Oncology Nurse Navigator.  Comprehensive assessment of the patient's malignancy, staging, need for surgery, chemotherapy, radiation therapy, and need for further testing were reviewed. Supportive measures, both inpatient and following discharge were also discussed. The recommended plan of care is documented. Greater than 35 minutes were spent correlating and coordinating this patient's care.

## 2023-07-18 DIAGNOSIS — Z483 Aftercare following surgery for neoplasm: Secondary | ICD-10-CM | POA: Diagnosis not present

## 2023-07-18 DIAGNOSIS — I10 Essential (primary) hypertension: Secondary | ICD-10-CM | POA: Diagnosis not present

## 2023-07-18 DIAGNOSIS — Z4803 Encounter for change or removal of drains: Secondary | ICD-10-CM | POA: Diagnosis not present

## 2023-07-18 DIAGNOSIS — D630 Anemia in neoplastic disease: Secondary | ICD-10-CM | POA: Diagnosis not present

## 2023-07-18 DIAGNOSIS — C519 Malignant neoplasm of vulva, unspecified: Secondary | ICD-10-CM | POA: Diagnosis not present

## 2023-07-18 DIAGNOSIS — F411 Generalized anxiety disorder: Secondary | ICD-10-CM | POA: Diagnosis not present

## 2023-07-19 NOTE — Progress Notes (Signed)
 Doris Reyes  [address] [place],  [place]  [address] [phone]  Clinic Day: 07/20/23  Referring physician:  Elyn Han, MD   Assessment:  Invasive lobular carcinoma This was found on screening mammogram but the MRI reveals focal enhancement surrounding the biopsy clip up to 2.3 cm and there was non-mass enhancement posterior to this known malignancy on MRI.  This area was biopsied in order to guide her ultimate surgery.This is strongly ER/PR positive, HER2 negative and has a low Ki 67 of less than 5%.  She has now had a lumpectomy but had to go back for reexcision of the margins.  The sentinel lymph node was negative but she did have 2 primaries, measuring 2.4 cm and 2.2 cm, for a T2 N0 M0, stage IIA.  Even though she has several favorable characteristics, such as grade 1 histology and a low Ki 67, I still recommended that we pursue Endopredict testing to quantitate her risk for recurrence, and fortunately she has an EpClin score of 2.6, low risk.  This correlates with a 5.1% risk of distant recurrence in the next 10 years. Her benefit from chemotherapy would be 1.0% so we did not pursue. Her risk for a late recurrence is 4.0%. Unfortunately she has refused hormonal therapy.  Vulvar Carcinoma Stage IIIC She has had surgical excision, and bilateral lymphadenectomy with positive nodes. We will plan concurrent chemoradiation with cisplatin. She will also have immunotherapy with pembrolizuab since her PDL 1 score is 85%, and we will continue that as maintenance after the other is completed.   Strong family history Including multiple females with breast cancer, her mother had colon cancer at age 41, and another maternal aunt had ovarian cancer.  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  She has been counseled on the implications of this and  is already getting regular colonoscopy.  However the information will also be passed on to family members to pursue testing.  Osteopenia Her spine shows a T score of -2.6 for osteoporosis and the right femur has osteopenia.  At the very least she should be taking calcium  and vitamin D. Bone density scan done on 07/17/2023 revealed osteopenia.  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 Pathology revealed a Stage IIIC squamous cell carcinoma with a PD-L1/CPS score of 85%. She had a PET scan done on 06/05/2023 which revealed metastatic left inguinal lymph nodes measuring up to 1.8 cm, 4 mm right lower lobe nodule, and no additional evidence of metastatic disease in the neck, chest, abdomen or pelvis. She inquired about the option of chemotherapy as she is not keen on taking it. I informed her that adjuvant chemotherapy and radiation will be the best option due to her prognosis. I mentioned that we can give her a low dose of chemotherapy to act as a radiosensitizer.  and/or immunotherapy and explained the differences and benefits of this. I informed her and her daughter of potential side effects such as fatigue, nausea, pneumonia, low WBC, colitis, skin rashes, and liver issues. I advised she have a port in place and explained what that is to her. Patient had a pelvic vulvectomy, bilateral debulking, radical lymphadenectomy, and inguinal dissection done on 06/28/2023 and patient is healing well but developed a infection at the surgical site, so she was placed on Bactrium. Patient informed me that she threw up 4  times in the morning and thinks it could be due to antibiotics so she did not take the pill this morning. She continues to have bleeding and discharge at the site of the tumor which is common. She will see Dr. Orvil Reyes in the morning and we will see what alternative antibiotics she recommends. She will have CBC and CMP drawn today and meet  with Dr. Cathren Reyes after her visit with me. Diagnostic bilateral mammogram done on 05/04/2023 and was clear. Bone density scan done on 07/17/2023 which revealed osteopenia. We will plan to start treatment in approximately 1 month with concurrent radiation which will include weekly cisplatin and immunotherapy with pembrolizumab. She will have a chemotherapy education prior to that and we will plan weekly follow-up during her treatment. Once this is completed, we will plan maintenance immunotherapy. After a discussion of the risks and benefits, she is willing to take treatment and I assured her she can stop if she experiences excessive toxicities. I discussed the assessment and treatment plan with the patient.  She was provided an opportunity to ask questions and all were answered.  The patient was advised to call back if she has further questions.   I provided 35 minutes of face-to-face time during this this encounter and > 50% was spent counseling as documented under my assessment and plan.   Doris Baumgartner, MD  Doris Reyes Doris Reyes Doris Reyes Doris Reyes [address] Doris Reyes [place] [address] Dept: [phone] Dept Fax: [phone]   No orders of the defined types were placed in this encounter.   CHIEF COMPLAINTS/PURPOSE OF CONSULTATION:  Breast cancer, stage IIA  HISTORY OF PRESENTING ILLNESS:  Doris Reyes 73 y.o. 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  She does have annual mammograms because of her strong family history, but was on hormone replacement therapy for 10 years.  She had a screening [facility] at [facility] on May 05, 2021 which revealed architectural distortion which was felt to be indeterminant.  She had a diagnostic mammogram on March 3 which showed mild nipple retraction and a 1 cm area of focal asymmetry in the retroareolar region.   Ultrasound revealed a hypoechoic irregular mass measuring 9 mm in that area.  On March 14 she had a biopsy performed which revealed a 0.9 cm irregular mass with spiculated margins in the right breast at 12:00, approximately 2 cm from the nipple and anteriorly in the breast.  This was found to be grade 2 invasive mammary carcinoma as well as mammary carcinoma in situ.  The E-cadherin immunohistochemistry stains were negative and so this is consistent with invasive lobular carcinoma.  The estrogen receptors were positive at 95% and progesterone receptors positive at 95% with HER2 by immunohistochemistry positive at 2+ but FISH was negative.  Ki-67 was less than 5%.  The carcinoma in situ was positive for E-cadherin, consistent with ductal carcinoma in situ.  She had an MRI of the breast and was found to have focal enhancement in the right breast surrounding the biopsy clip and some non-mass enhancement posterior to the malignancy.  It was recommended that she have an MRI guided biopsy of the clumped non-mass enhancement area and this revealed invasive lobular carcinoma grade 1 with lobular carcinoma in situ and extensive fibrocystic changes with focal intraluminal microcalcification and intraductal papillomatosis.  Due to her strong family history including multiple females with breast  cancer in her mother with colon cancer at age 53, she did have genetic testing with [facility] and was found to have a carrier mutation of MUTYH.  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  It was recommended that she have colonoscopy beginning at age 89 or 10 years prior to the age of her first-degree use relatives age at colorectal cancer diagnosis.  It was recommended this be repeated every 5 years.  This was explained to her.  She has now had a lumpectomy but had to go back for reexcision of the margins.  The  sentinel lymph node was negative but she did have 2 primaries, measuring 2.4 cm and 2.2 cm, for a T2 N0 M0, stage IIA. This was ER/PR positive, HER 2 negative and a Ki-67 was 5%.  Even though she has several favorable characteristics, such as grade 1 histology and a low Ki 67, I still recommended that we pursue Endopredict testing to quantitate her risk for recurrence, and fortunately she has an EpClin score of 2.6, low risk. This correlates with a 5.1% risk of distant recurrence in the next 10 years, with only a 1.0% benefit of chemotherapy. Her risk of late recurrence is 4.0%. I recommended hormonal therapy but she refused.  I reviewed her records extensively and collaborated the history with the patient.  SUMMARY OF ONCOLOGIC HISTORY: Oncology History  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)  06/08/2021 Initial Diagnosis   Breast cancer in female [facility])   07/23/2021 Genetic Testing   Negative hereditary cancer genetic testing: no pathogenic variants detected in [facility] BRCAPl[facility] Panel. Report date is July 23, 2021.  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  The report date is Jul 26, 2021.  The BRCAplus panel offered by [employer] [facility] and includes sequencing and deletion/duplication analysis for the following 8 genes: ATM, BRCA1, BRCA2, CDH1, CHEK2, PALB2, PTEN, and TP53.  Results of pan-cancer panel pending.   The CancerNext-Expanded gene panel offered by [facility] and includes sequencing and rearrangement analysis for the following 77 genes: AIP, ALK, APC*, ATM*, AXIN2, BAP1, BARD1, BLM, BMPR1A, BRCA1*, BRCA2*, BRIP1*, CDC73, CDH1*, CDK4, CDKN1B, CDKN2A, CHEK2*, CTNNA1, DICER1, FANCC, FH, FLCN, GALNT12, KIF1B, LZTR1, MAX, MEN1, MET, MLH1*, MSH2*, MSH3, MSH6*, MUTYH*, NBN, NF1*, NF2, NTHL1, PALB2*, PHOX2B, PMS2*, POT1, PRKAR1A, PTCH1, PTEN*, RAD51C*,  RAD51D*, RB1, RECQL, RET, SDHA, SDHAF2, SDHB, SDHC, SDHD, SMAD4, SMARCA4, SMARCB1, SMARCE1, STK11, SUFU, TMEM127, TP53*, TSC1, TSC2, VHL and XRCC2 (sequencing and deletion/duplication); EGFR, EGLN1, HOXB13, KIT, MITF, PDGFRA, POLD1, and POLE (sequencing only); EPCAM and GREM1 (deletion/duplication only). DNA and RNA analyses performed for * genes.    08/23/2021 Cancer Staging   Staging form: Breast, AJCC 8th Edition - Pathologic stage from 08/23/2021: Stage IA (pT2(2), pN0(sn), cM0, G1, ER+, PR+, HER2-) - Signed by Doris Baumgartner, MD on 09/29/2021 Histopathologic type: Lobular carcinoma, NOS Stage prefix: Initial diagnosis Method of lymph node assessment: Sentinel lymph node biopsy Nuclear grade: G1 Multigene prognostic tests performed: EndoPredict Histologic grading system: 3 grade system Residual tumor (R): R0 - None Laterality: Right Tumor size (mm): 24 Multiple tumors: Yes Number of tumors: 2 Lymph-vascular invasion (LVI): LVI not present (absent)/not identified Diagnostic confirmation: Positive histology PLUS positive immunophenotyping and/or positive genetic studies Specimen type: Excision Staged by: Managing physician  Menopausal status: Postmenopausal Ki-67 (%): 5 Stage used in treatment planning: Yes National guidelines used in treatment planning: Yes Type of national guideline used in treatment planning: NCCN   In terms of breast cancer risk profile:  She menarched at early age of 21-13 and went to surgical menopause at age 67 She had 1 pregnancy, her first child was born at age 78 She was exposed hormone replacement therapy for 10 years in the form of a patch.  She has significant positive family history of Breast and ovarian and colon cancer  INTERVAL HISTORY: Doris Reyes is here today for a follow up for breast cancer stage IIA, diagnosed in March, 2023. Patient states that she feels ok but complains of pain of her vulva and bilateral groin area rating a 2/10 after taking  tylenol . 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 Pathology revealed a Stage IIIC squamous cell carcinoma with a PD-L1/CPS score of 85%. She had a PET scan done on 06/05/2023 revealed metastatic left inguinal lymph nodes measuring up to 1.8 cm, 4 mm right lower lobe nodule, and no additional evidence of metastatic disease in the neck, chest, abdomen or pelvis. She inquired about the option of chemotherapy as she is not keen on taking it. I informed her that chemotherapy and radiation will be the best option due to her prognosis. I mentioned that we can give her a low dose of chemotherapy and/or immunotherapy and explained the differences and benefits of this. I informed her and her daughter of potential side effects such as fatigue, nausea, pneumonia, low WBC, colitis, skin rashes, and liver issues. I advised she have a port in place and explained what that is to her. Her breast surgeon was Dr. Afton Horse and I will refer her for a port placement. Patient had a pelvic vulvectomy, bilateral debulking, radical lymphadenectomy, and inguinal dissection done on 06/28/2023 and patient is healing well but developed a infection at the surgical site, so she was placed on Bactrium. Patient informed me that she threw up 4 times in the morning and thinks it could be due to antibiotics so she did not take the pill this morning. She continues to have bleeding and discharge at the site of the tumor which is common. She will see Dr. Orvil Reyes in the morning and we will see what alternative antibiotics she recommends. She will have CBC and CMP drawn today and meet with Dr. Cathren Reyes after her visit with me. Diagnostic bilateral mammogram done on 05/04/2023 and was clear. Bone density scan done on 07/17/2023 which revealed osteopenia. We will plan to start treatment in approximately 1 month with concurrent radiation which will include weekly cisplatin and immunotherapy with  pembrolizumab. She will have a chemotherapy education prior to that and we will plan weekly follow-up during her treatment. Once this is completed, we will plan maintenance immunotherapy. After a discussion of the risks and benefits, she is willing to take treatment and I assured her she can stop if she experiences excessive toxicities.    She denies fever, chills, night sweats, or other signs of infection. She denies cardiorespiratory and gastrointestinal issues. She  denies pain. Her appetite is ok and her weight has increased 1 pounds over last week . This patient is accompanied in the office by her daughter, Doris Reyes.    MEDICAL HISTORY:  Past Medical History:  Diagnosis Date   Anemia    Anxiety    Cancer (HCC)  right breast ILC   Complication of anesthesia    difficulty waking up   Depression    Diverticulitis    Family history of breast cancer    Family history of ovarian cancer    Family history of prostate cancer    Fibromyalgia    GERD (gastroesophageal reflux disease)    Heart murmur    History of colonic polyps    History of hiatal hernia    Hyperlipidemia    Hypertension    IBS (irritable bowel syndrome)    Ischemic colitis (HCC) 03/2012   Osteoporosis    Pre-diabetes    RLS (restless legs syndrome)    Sinusitis    Sleep apnea    CPAP nightly  Vitamin D deficiency Iron deficiency treated with IV iron infusions in 2019  SURGICAL HISTORY: Past Surgical History:  Procedure Laterality Date   BREAST LUMPECTOMY WITH RADIOACTIVE SEED LOCALIZATION Right 08/04/2021   Procedure: RIGHT BREAST LUMPECTOMY WITH RADIOACTIVE SEED LOCALIZATION;  Surgeon: Sim Dryer, MD;  Location: [facility] OR;  Service: General;  Laterality: Right;   CHOLECYSTECTOMY  2004   COLONOSCOPY WITH PROPOFOL  N/A 04/18/2017   Procedure: COLONOSCOPY WITH PROPOFOL ;  Surgeon: Tami Falcon, MD;  Location: [facility];  Service: Endoscopy;  Laterality: N/A;   ESOPHAGOGASTRODUODENOSCOPY (EGD) WITH PROPOFOL  N/A  04/18/2017   Procedure: ESOPHAGOGASTRODUODENOSCOPY (EGD) WITH PROPOFOL ;  Surgeon: Tami Falcon, MD;  Location: [facility];  Service: Endoscopy;  Laterality: N/A;   EXAM UNDER ANESTHESIA, PELVIC N/A 06/28/2023   Procedure: EXAM UNDER ANESTHESIA, PELVIC;  Surgeon: Suzi Essex, MD;  Location: [facility];  Service: Gynecology;  Laterality: N/A;   FLEXIBLE SIGMOIDOSCOPY  04/20/2012   Procedure: FLEXIBLE SIGMOIDOSCOPY;  Surgeon: Almeda Aris, MD;  Location: [facility];  Service: Endoscopy;  Laterality: N/A;   INGUINAL LYMPHADENECTOMY N/A 06/28/2023   Procedure: LYMPHADENECTOMY, INGUINAL, OPEN;  Surgeon: Suzi Essex, MD;  Location: [facility];  Service: Gynecology;  Laterality: N/A;   NISSEN FUNDOPLICATION  2011   PARAESOPHAGEAL HERNIA REPAIR  09/11/2009   and Nissen fundoplication   RADICAL VULVECTOMY N/A 06/28/2023   Procedure: VULVECTOMY, RADICAL;  Surgeon: Suzi Essex, MD;  Location: [facility];  Service: Gynecology;  Laterality: N/A;  Possible skin flap   RE-EXCISION OF BREAST LUMPECTOMY Right 08/18/2021   Procedure: RE-EXCISION RIGHT BREAST LUMPECTOMY;  Surgeon: Sim Dryer, MD;  Location: [facility];  Service: General;  Laterality: Right;   SENTINEL NODE BIOPSY N/A 08/04/2021   Procedure: SENTINEL NODE BIOPSY;  Surgeon: Sim Dryer, MD;  Location: [facility];  Service: General;  Laterality: N/A;   TONSILLECTOMY  1960's   TOTAL ABDOMINAL HYSTERECTOMY  2001   w/ BSO  She had 2 large benign tumors of the ovaries removed with bilateral salpingo oophorectomy  SOCIAL HISTORY: Social History   Socioeconomic History   Marital status: Widowed    Spouse name: Royston Cornea   Number of children: 1   Years of education: Not on file   Highest education level: Some college, no degree  Occupational History   Occupation: disabled  Tobacco Use   Smoking status: Never   Smokeless tobacco: Never   Tobacco comments:    brief exposure through her husband  Vaping Use   Vaping  status: Never Used  Substance and Sexual Activity   Alcohol use: Never    Alcohol/week: 0.0 standard drinks of alcohol   Drug use: Never   Sexual activity: Not Currently  Other Topics Concern   Not on file  Social History  Narrative   From [place] originally. Previously lived in [place]. Previously worked at [employer] also in a [employer]. Has also worked as a Diplomatic Services operational officer for [employer]. No pets currently. No bird exposure. No known mold in her current home.    Lives with husband   Caffeine- coffee 2 c daily   Social Drivers of Reyes   Financial Resource Strain: Low Risk  (02/01/2022)   Overall Financial Resource Strain (CARDIA)    Difficulty of Paying Living Expenses: Not hard at all  Food Insecurity: No Food Insecurity (06/28/2023)   Hunger Vital Sign    Worried About Running Out of Food in the Last Year: Never true    Ran Out of Food in the Last Year: Never true  Transportation Needs: No Transportation Needs (06/28/2023)   PRAPARE - Administrator, Civil Service (Medical): No    Lack of Transportation (Non-Medical): No  Physical Activity: Not on file  Stress: Not on file  Social Connections: Moderately Isolated (06/28/2023)   Social Connection and Isolation Panel [NHANES]    Frequency of Communication with Friends and Family: More than three times a week    Frequency of Social Gatherings with Friends and Family: Twice a week    Attends Religious Services: More than 4 times per year    Active Member of [employer] or Organizations: No    Attends Banker Meetings: Never    Marital Status: Widowed  Intimate Partner Violence: Not At Risk (06/28/2023)   Humiliation, Afraid, Rape, and Kick questionnaire    Fear of Current or Ex-Partner: No    Emotionally Abused: No    Physically Abused: No    Sexually Abused: No   The patient is here with her husband Arch Ko and daughter Reyes doctor today.  She has never smoked and does not drink alcohol or use drugs. FAMILY  HISTORY: Family History  Problem Relation Age of Onset   Colon cancer Mother 62   Alzheimer's disease Mother    Heart attack Father    Cancer Maternal Aunt        x2   Alzheimer's disease Maternal Aunt    Ovarian cancer Maternal Aunt    Prostate cancer Maternal Uncle    Kidney disease Maternal Uncle    Atrial fibrillation Maternal Uncle    Stroke Paternal Uncle    Aneurysm Paternal Grandmother        brain   Stroke Paternal Grandfather    Prostate cancer Cousin        paternal first cousin   Prostate cancer Cousin        paternal first cousin   Esophageal cancer Cousin        maternal first cousin   Breast cancer Cousin        DCIS, maternal first cousin   Ovarian cancer Cousin        maternal first cousin   Rheum arthritis Other    Lung disease Neg Hx   Ovarian cancer                                               maternal aunt                         33's Breast cancer  cousin                                     77's Prostate cancer                                              maternal uncle                        45's  ALLERGIES:  is allergic to ceftin [cefuroxime axetil], cefuroxime, other, and tramadol hcl.  MEDICATIONS:  Current Outpatient Medications  Medication Sig Dispense Refill   acetaminophen  (TYLENOL ) 650 MG CR tablet 1,300 mg in the morning and at bedtime.     ALPRAZolam  (XANAX ) 0.25 MG tablet Take 0.25 mg by mouth at bedtime as needed for anxiety.     amLODipine  (NORVASC ) 5 MG tablet Take 5 mg by mouth daily.     Ascorbic Acid (VITAMIN C) 500 MG CHEW Chew 500 mg by mouth daily.     aspirin EC 81 MG tablet Take 81 mg by mouth at bedtime. Swallow whole.     atenolol  (TENORMIN ) 50 MG tablet Take 75 mg by mouth daily.      benazepril  (LOTENSIN ) 10 MG tablet Take 10 mg by mouth daily.     benzonatate (TESSALON) 100 MG capsule Take 100-200 mg by mouth at bedtime.     Biotin 1000 MCG tablet Take 1,000 mcg by mouth  daily.     Calcium  Citrate-Vitamin D (CITRACAL + D PO) Take 1 tablet by mouth daily.     cetirizine (ZYRTEC) 10 MG tablet Take 10 mg by mouth at bedtime.     Cholecalciferol (VITAMIN D) 50 MCG (2000 UT) tablet Take 2,000 Units by mouth daily.     cyanocobalamin 1000 MCG tablet Take 1,000 mcg by mouth daily.     diclofenac sodium (VOLTAREN) 1 % GEL Apply 1 application  topically daily as needed (for pain).     diphenhydrAMINE  (BENADRYL ) 25 MG tablet Take 25 mg by mouth every 6 (six) hours as needed for allergies.     diphenoxylate-atropine (LOMOTIL) 2.5-0.025 MG tablet Take 2 tablets by mouth 4 (four) times daily as needed for diarrhea or loose stools.     DULoxetine  (CYMBALTA ) 60 MG capsule Take 120 mg by mouth at bedtime.     EPINEPHrine  (EPIPEN  2-PAK) 0.3 mg/0.3 mL IJ SOAJ injection Inject 0.3 mg into the muscle as needed for anaphylaxis.     estradiol  (ESTRACE  VAGINAL) 0.1 MG/GM vaginal cream Place 1 Applicatorful vaginally 3 (three) times a week. 42.5 g 12   famotidine  (PEPCID ) 20 MG tablet Take 20 mg by mouth at bedtime.     fexofenadine (ALLEGRA) 180 MG tablet Take 180 mg by mouth in the morning.     fluticasone  (FLONASE ) 50 MCG/ACT nasal spray Place 1 spray into both nostrils daily.     lidocaine  (XYLOCAINE ) 5 % ointment Apply 1 Application topically 2 (two) times daily as needed for moderate pain (pain score 4-6) (to the vulva). Apply to vulva as needed for discomfort 35.44 g 3   NON FORMULARY Pt uses a c-pap nightly     Nutritional Supplements (JUICE PLUS FIBRE PO) Take 2 each by mouth daily. Fruits and Vegetables     Omega-3 Fatty Acids (FISH  OIL) 1000 MG CAPS Take 1,000 mg by mouth daily.      ondansetron  (ZOFRAN ) 4 MG tablet Take 4 mg by mouth every 8 (eight) hours as needed for nausea or vomiting.     pantoprazole  (PROTONIX ) 40 MG tablet Take 40 mg by mouth every evening.     Phenylephrine -APAP-Guaifenesin (TYLENOL  SINUS SEVERE PO) Take 2 tablets by mouth daily as needed (drainage).      polyvinyl alcohol (LIQUIFILM TEARS) 1.4 % ophthalmic solution Place 1 drop into both eyes 5 (five) times daily.     Probiotic Product (PROBIOTIC & ACIDOPHILUS EX ST PO) Take 1 capsule by mouth daily. Ultra Flora Plus Capsules     Propylene Glycol (SYSTANE COMPLETE) 0.6 % SOLN Place 1 drop into both eyes in the morning and at bedtime.     rOPINIRole  (REQUIP ) 0.5 MG tablet Take 0.5 mg by mouth at bedtime.     senna (SENOKOT) 8.6 MG tablet 2 tablets at bedtime as needed Orally Once a day     senna-docusate (SENOKOT-S) 8.6-50 MG tablet Take 2 tablets by mouth at bedtime. For AFTER surgery, do not take if having diarrhea (Patient taking differently: Take 1 tablet by mouth at bedtime.) 30 tablet 0   simvastatin  (ZOCOR ) 10 MG tablet Take 10 mg by mouth at bedtime.       sucralfate  (CARAFATE ) 1 GM/10ML suspension Take 1 g by mouth 2 (two) times daily.     sulfamethoxazole -trimethoprim  (BACTRIM  DS) 800-160 MG tablet Take 1 tablet by mouth 2 (two) times daily. 14 tablet 0   traZODone  (DESYREL ) 50 MG tablet Take 50 mg by mouth at bedtime.     triamcinolone  (KENALOG ) 0.025 % ointment Apply 1 Application topically 2 (two) times daily. 30 g 0   vitamin E 400 UNIT capsule Take 400 Units by mouth daily.       White Petrolatum -Mineral Oil (REFRESH P.M. OP) Place 1 Application into both eyes at bedtime.     No current facility-administered medications for this visit.   REVIEW OF SYSTEMS:   Review of Systems  Constitutional: Negative.  Negative for appetite change, chills, diaphoresis, fatigue, fever and unexpected weight change.  HENT:  Negative.  Negative for hearing loss, lump/mass, mouth sores, nosebleeds, sore throat, tinnitus, trouble swallowing and voice change.   Eyes: Negative.  Negative for eye problems and icterus.  Respiratory: Negative.  Negative for chest tightness, cough, hemoptysis, shortness of breath and wheezing.   Cardiovascular: Negative.  Negative for chest pain, leg swelling and  palpitations.  Gastrointestinal: Negative.  Negative for abdominal distention, abdominal pain, blood in stool, constipation, diarrhea, nausea, rectal pain and vomiting.  Endocrine: Negative.   Genitourinary: Negative.  Negative for bladder incontinence, difficulty urinating, dyspareunia, dysuria, frequency, hematuria, menstrual problem, nocturia, pelvic pain, vaginal bleeding and vaginal discharge.        Pain of her vulva and bilateral groin area rating a 2/10 after taking tylenol .  Musculoskeletal:  Positive for arthralgias (left foot). Negative for back pain, flank pain, gait problem, myalgias, neck pain and neck stiffness.  Skin: Negative.  Negative for itching, rash and wound.  Neurological:  Negative for dizziness, extremity weakness, gait problem, headaches, light-headedness, numbness, seizures and speech difficulty.  Hematological: Negative.  Negative for adenopathy. Does not bruise/bleed easily.  Psychiatric/Behavioral: Negative.  Negative for confusion, decreased concentration, depression, sleep disturbance and suicidal ideas. The patient is not nervous/anxious.    PHYSICAL EXAMINATION: ECOG PERFORMANCE STATUS: 1 - Symptomatic but completely ambulatory  Physical Exam Vitals and nursing  note reviewed. Exam conducted with a chaperone present.  Constitutional:      General: She is not in acute distress.    Appearance: Normal appearance. She is normal weight. She is not ill-appearing, toxic-appearing or diaphoretic.  HENT:     Head: Normocephalic and atraumatic.     Right Ear: Tympanic membrane, ear canal and external ear normal. There is no impacted cerumen.     Left Ear: Tympanic membrane, ear canal and external ear normal. There is no impacted cerumen.     Nose: Nose normal. No congestion or rhinorrhea.     Mouth/Throat:     Mouth: Mucous membranes are moist.     Pharynx: Oropharynx is clear. No oropharyngeal exudate or posterior oropharyngeal erythema.  Eyes:     General: No  scleral icterus.       Right eye: No discharge.        Left eye: No discharge.     Extraocular Movements: Extraocular movements intact.     Conjunctiva/sclera: Conjunctivae normal.     Pupils: Pupils are equal, round, and reactive to light.  Neck:     Vascular: No carotid bruit.  Cardiovascular:     Rate and Rhythm: Normal rate and regular rhythm.     Pulses: Normal pulses.     Heart sounds: Normal heart sounds. No murmur heard.    No friction rub. No gallop.  Pulmonary:     Effort: Pulmonary effort is normal. No respiratory distress.     Breath sounds: Normal breath sounds. No stridor. No wheezing, rhonchi or rales.  Chest:     Chest wall: No tenderness.     Comments: No masses in either breast Well healed right axillary scar Abdominal:     General: Bowel sounds are normal. There is no distension.     Palpations: Abdomen is soft. There is no hepatomegaly, splenomegaly or mass.     Tenderness: There is no abdominal tenderness. There is no right CVA tenderness, left CVA tenderness, guarding or rebound.     Hernia: No hernia is present.  Genitourinary:    Comments: She has bilateral groin incisions that are healing well but she does have some swelling and erythema above the incisions with no drainage. This area is mildly tender but not hot to touch.  Musculoskeletal:        General: No swelling, deformity or signs of injury.     Right shoulder: No tenderness. Normal range of motion.     Cervical back: Normal range of motion and neck supple. No rigidity or tenderness.     Right lower leg: No edema.     Left lower leg: No edema.  Lymphadenopathy:     Cervical: No cervical adenopathy.     Right cervical: No superficial, deep or posterior cervical adenopathy.    Left cervical: No superficial, deep or posterior cervical adenopathy.     Upper Body:     Right upper body: No supraclavicular, axillary or pectoral adenopathy.     Left upper body: No supraclavicular, axillary or pectoral  adenopathy.  Skin:    General: Skin is warm and dry.     Coloration: Skin is not jaundiced or pale.     Findings: No bruising, erythema, lesion or rash.  Neurological:     General: No focal deficit present.     Mental Status: She is alert and oriented to person, place, and time. Mental status is at baseline.     Cranial Nerves: No cranial nerve deficit.  Sensory: No sensory deficit.     Motor: No weakness.     Coordination: Coordination normal.     Gait: Gait normal.     Deep Tendon Reflexes: Reflexes normal.  Psychiatric:        Mood and Affect: Mood normal.        Behavior: Behavior normal.        Thought Content: Thought content normal.        Judgment: Judgment normal.    LABORATORY DATA:  I have reviewed the data as listed Lab Results  Component Value Date   WBC 4.3 07/20/2023   HGB 11.8 (L) 07/20/2023   HCT 36.6 07/20/2023   MCV 86.5 07/20/2023   PLT 227 07/20/2023   Lab Results  Component Value Date   CREATININE 1.02 (H) 07/20/2023   BUN 16 07/20/2023   NA 136 07/20/2023   K 4.7 07/20/2023   CL 103 07/20/2023   CO2 24 07/20/2023      Component Value Date/Time   PROT 6.3 (L) 07/20/2023 1101   ALBUMIN 4.1 07/20/2023 1101   AST 16 07/20/2023 1101   ALT 13 07/20/2023 1101   ALKPHOS 86 07/20/2023 1101   BILITOT 0.2 07/20/2023 1101     RADIOGRAPHIC STUDIES: DG Bone Density Result Date: 07/17/2023 EXAM: DUAL X-RAY ABSORPTIOMETRY (DXA) FOR BONE MINERAL DENSITY 07/17/2023 11:48 am CLINICAL DATA:  73 year old Female Postmenopausal. Screening for osteoporosis Patient is or has been on glucocorticoid therapy. Patient is or has been on bone building therapies. TECHNIQUE: An axial (e.g., hips, spine) and/or appendicular (e.g., radius) exam was performed, as appropriate, using [employer] Secretary/administrator at [employer]. Images are obtained for bone mineral density measurement and are not obtained for diagnostic purposes. [id number] Exclusions: L1 excluded  due to degenerative changes. COMPARISON:  None. New baseline. FINDINGS: Scan quality: Good. LUMBAR SPINE (L2-L4): BMD (in g/cm2): 0.912 T-score: -2.4 Z-score: -0.7 LEFT FEMORAL NECK: BMD (in g/cm2): 0.793 T-score: -1.8 Z-score: 0.0 LEFT TOTAL HIP: BMD (in g/cm2): 0.922 T-score: -0.7 Z-score: 0.9 RIGHT FEMORAL NECK: BMD (in g/cm2): 0.855 T-score: -1.3 Z-score: 0.5 RIGHT TOTAL HIP: BMD (in g/cm2): 0.896 T-score: -0.9 Z-score: 0.7 FRAX 10-YEAR PROBABILITY OF FRACTURE: 10-year fracture risk is performed using the [employer] FRAX calculator based on patient-reported risk factors. Major osteoporotic fracture: 10.5% Hip fracture: 2.0% Other situations known to alter the reliability of the FRAX score should be considered when making treatment decisions, including chronic glucocorticoid use and past treatments. Further guidance on treatment can be found at the [employer] [facility]'s website https://www.patton.com/. IMPRESSION: Osteopenia based on BMD. Fracture risk is unknown due to history of bone building therapy. RECOMMENDATIONS: 1. All patients should optimize calcium  and vitamin D intake. 2. Consider FDA-approved medical therapies in postmenopausal women and men aged 51 years and older, based on the following: - A hip or vertebral (clinical or morphometric) fracture - T-score less than or equal to -2.5 and secondary causes have been excluded. - Low bone mass (T-score between -1.0 and -2.5) and a 10-year probability of a hip fracture greater than or equal to 3% or a 10-year probability of a major osteoporosis-related fracture greater than or equal to 20% based on the [place] -adapted WHO algorithm. - Clinician judgment and/or patient preferences may indicate treatment for people with 10-year fracture probabilities above or below these levels 3. Patients with diagnosis of osteoporosis or at high risk for fracture should have regular bone mineral density tests. For patients eligible for Medicare, routine testing  is allowed once every 2 years. The testing frequency can be increased to one year for patients who have rapidly progressing disease, those who are receiving or discontinuing medical therapy to restore bone mass, or have additional risk factors. Electronically Signed   By: Sundra Engel M.D.   On: 07/17/2023 13:39    EXAM: 06/05/2023 NUCLEAR MEDICINE PET SKULL BASE TO THIGH IMPRESSION: 1. Metastatic left inguinal lymph nodes. No additional evidence of metastatic disease in the neck, chest, abdomen or pelvis. 2. 4 mm right lower lobe nodule, too small for PET resolution. Recommend attention on follow-up. 3. Small to moderate hiatal hernia. 4.  Aortic atherosclerosis (ICD10-I70.0).     I,Jasmine M Lassiter,acting as a scribe for Doris Baumgartner, MD.,have documented all relevant documentation on the behalf of Doris Baumgartner, MD,as directed by  Doris Baumgartner, MD while in the presence of Doris Baumgartner, MD.

## 2023-07-20 ENCOUNTER — Inpatient Hospital Stay (HOSPITAL_BASED_OUTPATIENT_CLINIC_OR_DEPARTMENT_OTHER): Admitting: Oncology

## 2023-07-20 ENCOUNTER — Ambulatory Visit
Admission: RE | Admit: 2023-07-20 | Discharge: 2023-07-20 | Disposition: A | Discharge status: Home / Self Care | Source: Ambulatory Visit | Attending: Radiation Oncology | Admitting: Radiation Oncology

## 2023-07-20 ENCOUNTER — Encounter: Payer: Self-pay | Admitting: Oncology

## 2023-07-20 ENCOUNTER — Other Ambulatory Visit: Payer: Self-pay | Admitting: Oncology

## 2023-07-20 ENCOUNTER — Inpatient Hospital Stay

## 2023-07-20 VITALS — BP 125/66 | HR 68 | Temp 97.5°F | Resp 16 | Ht 63.0 in | Wt 213.5 lb

## 2023-07-20 DIAGNOSIS — Z8041 Family history of malignant neoplasm of ovary: Secondary | ICD-10-CM | POA: Insufficient documentation

## 2023-07-20 DIAGNOSIS — K219 Gastro-esophageal reflux disease without esophagitis: Secondary | ICD-10-CM | POA: Insufficient documentation

## 2023-07-20 DIAGNOSIS — Z7982 Long term (current) use of aspirin: Secondary | ICD-10-CM | POA: Insufficient documentation

## 2023-07-20 DIAGNOSIS — Z923 Personal history of irradiation: Secondary | ICD-10-CM | POA: Insufficient documentation

## 2023-07-20 DIAGNOSIS — E785 Hyperlipidemia, unspecified: Secondary | ICD-10-CM | POA: Insufficient documentation

## 2023-07-20 DIAGNOSIS — C519 Malignant neoplasm of vulva, unspecified: Secondary | ICD-10-CM

## 2023-07-20 DIAGNOSIS — M810 Age-related osteoporosis without current pathological fracture: Secondary | ICD-10-CM | POA: Insufficient documentation

## 2023-07-20 DIAGNOSIS — Z860100 Personal history of colon polyps, unspecified: Secondary | ICD-10-CM | POA: Insufficient documentation

## 2023-07-20 DIAGNOSIS — M797 Fibromyalgia: Secondary | ICD-10-CM | POA: Diagnosis not present

## 2023-07-20 DIAGNOSIS — Z803 Family history of malignant neoplasm of breast: Secondary | ICD-10-CM | POA: Insufficient documentation

## 2023-07-20 DIAGNOSIS — K449 Diaphragmatic hernia without obstruction or gangrene: Secondary | ICD-10-CM | POA: Insufficient documentation

## 2023-07-20 DIAGNOSIS — G4730 Sleep apnea, unspecified: Secondary | ICD-10-CM | POA: Insufficient documentation

## 2023-07-20 DIAGNOSIS — Z9079 Acquired absence of other genital organ(s): Secondary | ICD-10-CM | POA: Diagnosis not present

## 2023-07-20 DIAGNOSIS — C774 Secondary and unspecified malignant neoplasm of inguinal and lower limb lymph nodes: Secondary | ICD-10-CM | POA: Diagnosis not present

## 2023-07-20 DIAGNOSIS — Z8042 Family history of malignant neoplasm of prostate: Secondary | ICD-10-CM | POA: Insufficient documentation

## 2023-07-20 DIAGNOSIS — C50111 Malignant neoplasm of central portion of right female breast: Secondary | ICD-10-CM | POA: Diagnosis not present

## 2023-07-20 DIAGNOSIS — I10 Essential (primary) hypertension: Secondary | ICD-10-CM | POA: Insufficient documentation

## 2023-07-20 DIAGNOSIS — F411 Generalized anxiety disorder: Secondary | ICD-10-CM

## 2023-07-20 DIAGNOSIS — Z79899 Other long term (current) drug therapy: Secondary | ICD-10-CM | POA: Insufficient documentation

## 2023-07-20 LAB — CBC WITH DIFFERENTIAL (CANCER CENTER ONLY)
Abs Immature Granulocytes: 0.02 10*3/uL (ref 0.00–0.07)
Basophils Absolute: 0 10*3/uL (ref 0.0–0.1)
Basophils Relative: 1 %
Eosinophils Absolute: 0.3 10*3/uL (ref 0.0–0.5)
Eosinophils Relative: 7 %
HCT: 36.6 % (ref 36.0–46.0)
Hemoglobin: 11.8 g/dL — ABNORMAL LOW (ref 12.0–15.0)
Immature Granulocytes: 1 %
Lymphocytes Relative: 17 %
Lymphs Abs: 0.7 10*3/uL (ref 0.7–4.0)
MCH: 27.9 pg (ref 26.0–34.0)
MCHC: 32.2 g/dL (ref 30.0–36.0)
MCV: 86.5 fL (ref 80.0–100.0)
Monocytes Absolute: 0.7 10*3/uL (ref 0.1–1.0)
Monocytes Relative: 16 %
Neutro Abs: 2.5 10*3/uL (ref 1.7–7.7)
Neutrophils Relative %: 58 %
Platelet Count: 227 10*3/uL (ref 150–400)
RBC: 4.23 MIL/uL (ref 3.87–5.11)
RDW: 13.4 % (ref 11.5–15.5)
WBC Count: 4.3 10*3/uL (ref 4.0–10.5)
nRBC: 0 % (ref 0.0–0.2)
nRBC: 0 /100{WBCs}

## 2023-07-20 LAB — CMP (CANCER CENTER ONLY)
ALT: 13 U/L (ref 0–44)
AST: 16 U/L (ref 15–41)
Albumin: 4.1 g/dL (ref 3.5–5.0)
Alkaline Phosphatase: 86 U/L (ref 38–126)
Anion gap: 10 (ref 5–15)
BUN: 16 mg/dL (ref 8–23)
CO2: 24 mmol/L (ref 22–32)
Calcium: 9.7 mg/dL (ref 8.9–10.3)
Chloride: 103 mmol/L (ref 98–111)
Creatinine: 1.02 mg/dL — ABNORMAL HIGH (ref 0.44–1.00)
GFR, Estimated: 58 mL/min — ABNORMAL LOW (ref >60)
Glucose, Bld: 110 mg/dL — ABNORMAL HIGH (ref 70–99)
Potassium: 4.7 mmol/L (ref 3.5–5.1)
Sodium: 136 mmol/L (ref 135–145)
Total Bilirubin: 0.2 mg/dL (ref 0.0–1.2)
Total Protein: 6.3 g/dL — ABNORMAL LOW (ref 6.5–8.1)

## 2023-07-21 ENCOUNTER — Telehealth: Payer: Self-pay

## 2023-07-21 ENCOUNTER — Encounter: Payer: Self-pay | Admitting: Gynecologic Oncology

## 2023-07-21 ENCOUNTER — Inpatient Hospital Stay: Admitting: Gynecologic Oncology

## 2023-07-21 ENCOUNTER — Other Ambulatory Visit: Payer: Self-pay | Admitting: Oncology

## 2023-07-21 VITALS — BP 118/61 | HR 57 | Temp 97.6°F | Resp 18 | Ht 63.0 in | Wt 212.4 lb

## 2023-07-21 DIAGNOSIS — Z7189 Other specified counseling: Secondary | ICD-10-CM

## 2023-07-21 DIAGNOSIS — L309 Dermatitis, unspecified: Secondary | ICD-10-CM

## 2023-07-21 DIAGNOSIS — C519 Malignant neoplasm of vulva, unspecified: Secondary | ICD-10-CM

## 2023-07-21 DIAGNOSIS — Z9079 Acquired absence of other genital organ(s): Secondary | ICD-10-CM

## 2023-07-21 DIAGNOSIS — N9089 Other specified noninflammatory disorders of vulva and perineum: Secondary | ICD-10-CM

## 2023-07-21 MED ORDER — TRIAMCINOLONE ACETONIDE 0.025 % EX OINT: 1.0000 | TOPICAL_OINTMENT | Freq: Two times a day (BID) | CUTANEOUS | 0 refills | Status: AC

## 2023-07-21 NOTE — Progress Notes (Signed)
 Gynecologic Oncology Return Clinic Visit  07/21/23  Reason for Visit: follow-up  Treatment History: Oncology History  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)  06/08/2021 Initial Diagnosis   Breast cancer in female [facility])   07/23/2021 Genetic Testing   Negative hereditary cancer genetic testing: no pathogenic variants detected in [facility] BRCAPlus Panel. Report date is July 23, 2021.  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  The report date is Jul 26, 2021.  The BRCAplus panel offered by [facility] and includes sequencing and deletion/duplication analysis for the following 8 genes: ATM, BRCA1, BRCA2, CDH1, CHEK2, PALB2, PTEN, and TP53.  Results of pan-cancer panel pending.   The CancerNext-Expanded gene panel offered by [facility] and includes sequencing and rearrangement analysis for the following 77 genes: AIP, ALK, APC*, ATM*, AXIN2, BAP1, BARD1, BLM, BMPR1A, BRCA1*, BRCA2*, BRIP1*, CDC73, CDH1*, CDK4, CDKN1B, CDKN2A, CHEK2*, CTNNA1, DICER1, FANCC, FH, FLCN, GALNT12, KIF1B, LZTR1, MAX, MEN1, MET, MLH1*, MSH2*, MSH3, MSH6*, MUTYH*, NBN, NF1*, NF2, NTHL1, PALB2*, PHOX2B, PMS2*, POT1, PRKAR1A, PTCH1, PTEN*, RAD51C*, RAD51D*, RB1, RECQL, RET, SDHA, SDHAF2, SDHB, SDHC, SDHD, SMAD4, SMARCA4, SMARCB1, SMARCE1, STK11, SUFU, TMEM127, TP53*, TSC1, TSC2, VHL and XRCC2 (sequencing and deletion/duplication); EGFR, EGLN1, HOXB13, KIT, MITF, PDGFRA, POLD1, and POLE (sequencing only); EPCAM and GREM1 (deletion/duplication only). DNA and RNA analyses performed for * genes.    08/23/2021 Cancer Staging   Staging form: Breast, AJCC 8th Edition - Pathologic stage from 08/23/2021: Stage IA (pT2(2), pN0(sn), cM0, G1, ER+, PR+, HER2-) - Signed by [name], MD on 09/29/2021 Histopathologic type: Lobular carcinoma, NOS Stage prefix: Initial diagnosis Method of  lymph node assessment: Sentinel lymph node biopsy Nuclear grade: G1 Multigene prognostic tests performed: EndoPredict Histologic grading system: 3 grade system Residual tumor (R): R0 - None Laterality: Right Tumor size (mm): 24 Multiple tumors: Yes Number of tumors: 2 Lymph-vascular invasion (LVI): LVI not present (absent)/not identified Diagnostic confirmation: Positive histology PLUS positive immunophenotyping and/or positive genetic studies Specimen type: Excision Staged by: Managing physician Menopausal status: Postmenopausal Ki-67 (%): 5 Stage used in treatment planning: Yes National guidelines used in treatment planning: Yes Type of national guideline used in treatment planning: NCCN    The patient endorsed having occasional abnormal pap smears prior to her hysterectomy in 2011 for fibroids. She denied ever having cervical biopsies or procedures done, thinks that Pap smears were just repeated. After her hysterectomy, she had a period of time where she did not get follow-up. Since establishing with her OB/GYN provider, she has been getting Pap smears which were initially normal. Her more recent Pap history is noted below.    09/21/20: ASCUS pap 09/27/2021: ASC-H pap 11/03/21: Vaginoscopy. 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 Biopsy shows squamous mucosa with mild koilocytic atypia c/w LSIL lesion. Vaginal biopsy 10/2022: HPV effect with mild dysplasia (VAIN1). Started using vaginal estrogen   Seen on 05/11/23. Reports new area within the vagina, some pruritus and pain. 1 cm area of ulceration noted along the left mid vulva with tenderness on palpation. Biopsy recommended - she preferred to come back when she had a driver.  Biopsy performed on 05/25/23, revealed grade 2 SCC, DOI at least 1.25 mm.    06/05/23: PET shows  hypermetabolic left inguinal lymph nodes measuring up to  1.8 cm. No other hypermetabolism noted.    On 06/28/2023, she underwent partial modified radical left vulvectomy, bilateral inguinal lymphadenectomy with Dr. Wiley Hanger. Her post-operative course was overall non-complicated. She stayed in the hospital until POD 2 with improved mobility at that time. Final path returned with: A. LYMPH NODE, LEFT INGUINAL, RESECTION:  Metastatic carcinoma in three of four lymph nodes (3/4).  Largest metastasis is 3 cm.  Extensive extranodal tumor.   B. LYMPH NODE, RIGHT INGUINAL, RESECTION:  Two lymph nodes negative for metastatic carcinoma (0/2).   C. VULVA, LEFT STITCH AT 12, VULVECTOMY:  Invasive squamous cell carcinoma associated with squamous cell carcinoma in situ, 3 cm.  Greatest depth of invasion is 5 mm.  Carcinoma in situ focally involves the 6:00 margin.  Invasive carcinoma focally less than 1 mm from 6:00 margin.  Lymphovascular space involvement by carcinoma.  See oncology table and comment.   D. LATERAL DEEP MARGIN:  Benign adipose tissue and connective tissue.  Negative for carcinoma.  Final lateral deep margin negative for carcinoma.   Interval History: Second JP drain removed on 4/18.  At that time, there was some erythema of the mons suspicious for cellulitis.  CBC was normal.  Bactrim  was restarted.  Patient overall doing well.  Had a day of nausea and emesis, thought to be secondary to the Bactrim .  She stopped this with than 48 hours worth of antibiotics to complete the total course.  She reports resolution of her symptoms.  Endorses normal bowel bladder function.  Some vulvar discharge.  Past Medical/Surgical History: Past Medical History:  Diagnosis Date   Anemia    Anxiety    Cancer (HCC)    right breast ILC   Complication of anesthesia    difficulty waking up   Depression    Diverticulitis    Family history of breast cancer    Family history of ovarian cancer     Family history of prostate cancer    Fibromyalgia    GERD (gastroesophageal reflux disease)    Heart murmur    History of colonic polyps    History of hiatal hernia    Hyperlipidemia    Hypertension    IBS (irritable bowel syndrome)    Ischemic colitis (HCC) 03/2012   Osteoporosis    Pre-diabetes    RLS (restless legs syndrome)    Sinusitis    Sleep apnea    CPAP nightly    Past Surgical History:  Procedure Laterality Date   BREAST LUMPECTOMY WITH RADIOACTIVE SEED LOCALIZATION Right 08/04/2021   Procedure: RIGHT BREAST LUMPECTOMY WITH RADIOACTIVE SEED LOCALIZATION;  Surgeon: Sim Dryer, MD;  Location: [facility];  Service: General;  Laterality: Right;   CHOLECYSTECTOMY  2004   COLONOSCOPY WITH PROPOFOL  N/A 04/18/2017   Procedure: COLONOSCOPY WITH PROPOFOL ;  Surgeon: Tami Falcon, MD;  Location: [facility];  Service: Endoscopy;  Laterality: N/A;   ESOPHAGOGASTRODUODENOSCOPY (EGD) WITH PROPOFOL  N/A 04/18/2017   Procedure: ESOPHAGOGASTRODUODENOSCOPY (EGD) WITH PROPOFOL ;  Surgeon: Tami Falcon, MD;  Location: [facility];  Service: Endoscopy;  Laterality: N/A;   EXAM UNDER ANESTHESIA, PELVIC N/A 06/28/2023   Procedure: EXAM UNDER ANESTHESIA, PELVIC;  Surgeon: Suzi Essex, MD;  Location: [facility];  Service: Gynecology;  Laterality: N/A;   FLEXIBLE SIGMOIDOSCOPY  04/20/2012   Procedure: FLEXIBLE SIGMOIDOSCOPY;  Surgeon: Almeda Aris, MD;  Location: [facility];  Service: Endoscopy;  Laterality: N/A;   INGUINAL LYMPHADENECTOMY N/A 06/28/2023   Procedure: LYMPHADENECTOMY, INGUINAL, OPEN;  Surgeon: Suzi Essex, MD;  Location: [facility];  Service: Gynecology;  Laterality: N/A;   NISSEN FUNDOPLICATION  2011   PARAESOPHAGEAL HERNIA REPAIR  09/11/2009   and Nissen fundoplication   RADICAL VULVECTOMY N/A 06/28/2023   Procedure: VULVECTOMY, RADICAL;  Surgeon: Suzi Essex, MD;  Location: [facility];  Service: Gynecology;  Laterality: N/A;  Possible skin flap   RE-EXCISION OF  BREAST LUMPECTOMY Right 08/18/2021   Procedure: RE-EXCISION RIGHT BREAST LUMPECTOMY;  Surgeon: Sim Dryer, MD;  Location: [facility];  Service: General;  Laterality: Right;   SENTINEL NODE BIOPSY N/A 08/04/2021   Procedure: SENTINEL NODE BIOPSY;  Surgeon: Sim Dryer, MD;  Location: [facility];  Service: General;  Laterality: N/A;   TONSILLECTOMY  1960's   TOTAL ABDOMINAL HYSTERECTOMY  2001   w/ BSO    Family History  Problem Relation Age of Onset   Colon cancer Mother 63   Alzheimer's disease Mother    Heart attack Father    Cancer Maternal Aunt        x2   Alzheimer's disease Maternal Aunt    Ovarian cancer Maternal Aunt    Prostate cancer Maternal Uncle    Kidney disease Maternal Uncle    Atrial fibrillation Maternal Uncle    Stroke Paternal Uncle    Aneurysm Paternal Grandmother        brain   Stroke Paternal Grandfather    Prostate cancer Cousin        paternal first cousin   Prostate cancer Cousin        paternal first cousin   Esophageal cancer Cousin        maternal first cousin   Breast cancer Cousin        DCIS, maternal first cousin   Ovarian cancer Cousin        maternal first cousin   Rheum arthritis Other    Lung disease Neg Hx     Social History   Socioeconomic History   Marital status: Widowed    Spouse name: Royston Cornea   Number of children: 1   Years of education: Not on file   Highest education level: Some college, no degree  Occupational History   Occupation: disabled  Tobacco Use   Smoking status: Never   Smokeless tobacco: Never   Tobacco comments:    brief exposure through her husband  Vaping Use   Vaping status: Never Used  Substance and Sexual Activity   Alcohol use: Never    Alcohol/week: 0.0 standard drinks of alcohol   Drug use: Never   Sexual activity: Not Currently  Other Topics Concern   Not on file  Social History Narrative   From [place] originally. Previously lived in [place]. Previously worked at [employer]  also in a [employer]. Has also worked as a Diplomatic Services operational officer for [employer]. No pets currently. No bird exposure. No known mold in her current home.    Lives with husband   Caffeine- coffee 2 c daily   Social Drivers of Health   Financial Resource Strain: Low Risk  (02/01/2022)   Overall Financial Resource Strain (CARDIA)    Difficulty of Paying Living Expenses: Not hard at all  Food Insecurity: No Food Insecurity (06/28/2023)   Hunger Vital Sign    Worried About Running Out of Food in the Last Year: Never true    Ran Out of Food in the Last Year: Never true  Transportation Needs: No Transportation Needs (06/28/2023)   PRAPARE - Administrator, Civil Service (  Medical): No    Lack of Transportation (Non-Medical): No  Physical Activity: Not on file  Stress: Not on file  Social Connections: Moderately Isolated (06/28/2023)   Social Connection and Isolation Panel [NHANES]    Frequency of Communication with Friends and Family: More than three times a week    Frequency of Social Gatherings with Friends and Family: Twice a week    Attends Religious Services: More than 4 times per year    Active Member of [employer] or Organizations: No    Attends Banker Meetings: Never    Marital Status: Widowed    Current Medications:  Current Outpatient Medications:    Omega-3 Fatty Acids (FISH OIL) 1000 MG CAPS, Take 1,000 mg by mouth daily. , Disp: , Rfl:    triamcinolone (KENALOG) 0.025 % ointment, Apply 1 Application topically 2 (two) times daily., Disp: 30 g, Rfl: 0   acetaminophen  (TYLENOL ) 650 MG CR tablet, 1,300 mg in the morning and at bedtime., Disp: , Rfl:    ALPRAZolam  (XANAX ) 0.25 MG tablet, Take 0.25 mg by mouth at bedtime as needed for anxiety., Disp: , Rfl:    amLODipine  (NORVASC ) 5 MG tablet, Take 5 mg by mouth daily., Disp: , Rfl:    Ascorbic Acid (VITAMIN C) 500 MG CHEW, Chew 500 mg by mouth daily., Disp: , Rfl:    aspirin EC 81 MG tablet, Take 81 mg by mouth at  bedtime. Swallow whole., Disp: , Rfl:    atenolol  (TENORMIN ) 50 MG tablet, Take 75 mg by mouth daily. , Disp: , Rfl:    benazepril  (LOTENSIN ) 10 MG tablet, Take 10 mg by mouth daily., Disp: , Rfl:    benzonatate (TESSALON) 100 MG capsule, Take 100-200 mg by mouth at bedtime., Disp: , Rfl:    Biotin 1000 MCG tablet, Take 1,000 mcg by mouth daily., Disp: , Rfl:    Calcium  Citrate-Vitamin D (CITRACAL + D PO), Take 1 tablet by mouth daily., Disp: , Rfl:    cetirizine (ZYRTEC) 10 MG tablet, Take 10 mg by mouth at bedtime., Disp: , Rfl:    Cholecalciferol (VITAMIN D) 50 MCG (2000 UT) tablet, Take 2,000 Units by mouth daily., Disp: , Rfl:    cyanocobalamin 1000 MCG tablet, Take 1,000 mcg by mouth daily., Disp: , Rfl:    diclofenac sodium (VOLTAREN) 1 % GEL, Apply 1 application  topically daily as needed (for pain)., Disp: , Rfl:    diphenhydrAMINE  (BENADRYL ) 25 MG tablet, Take 25 mg by mouth every 6 (six) hours as needed for allergies., Disp: , Rfl:    diphenoxylate-atropine (LOMOTIL) 2.5-0.025 MG tablet, Take 2 tablets by mouth 4 (four) times daily as needed for diarrhea or loose stools., Disp: , Rfl:    DULoxetine  (CYMBALTA ) 60 MG capsule, Take 120 mg by mouth at bedtime., Disp: , Rfl:    EPINEPHrine  (EPIPEN  2-PAK) 0.3 mg/0.3 mL IJ SOAJ injection, Inject 0.3 mg into the muscle as needed for anaphylaxis., Disp: , Rfl:    [Paused] estradiol  (ESTRACE  VAGINAL) 0.1 MG/GM vaginal cream, Place 1 Applicatorful vaginally 3 (three) times a week., Disp: 42.5 g, Rfl: 12   famotidine  (PEPCID ) 20 MG tablet, Take 20 mg by mouth at bedtime., Disp: , Rfl:    fexofenadine (ALLEGRA) 180 MG tablet, Take 180 mg by mouth in the morning., Disp: , Rfl:    fluticasone  (FLONASE ) 50 MCG/ACT nasal spray, Place 1 spray into both nostrils daily., Disp: , Rfl:    lidocaine  (XYLOCAINE ) 5 % ointment, Apply 1 Application  topically 2 (two) times daily as needed for moderate pain (pain score 4-6) (to the vulva). Apply to vulva as needed  for discomfort, Disp: 35.44 g, Rfl: 3   NON FORMULARY, Pt uses a c-pap nightly, Disp: , Rfl:    Nutritional Supplements (JUICE PLUS FIBRE PO), Take 2 each by mouth daily. Fruits and Vegetables, Disp: , Rfl:    ondansetron  (ZOFRAN ) 4 MG tablet, Take 4 mg by mouth every 8 (eight) hours as needed for nausea or vomiting., Disp: , Rfl:    pantoprazole  (PROTONIX ) 40 MG tablet, Take 40 mg by mouth every evening., Disp: , Rfl:    Phenylephrine -APAP-Guaifenesin (TYLENOL  SINUS SEVERE PO), Take 2 tablets by mouth daily as needed (drainage)., Disp: , Rfl:    polyvinyl alcohol (LIQUIFILM TEARS) 1.4 % ophthalmic solution, Place 1 drop into both eyes 5 (five) times daily., Disp: , Rfl:    Probiotic Product (PROBIOTIC & ACIDOPHILUS EX ST PO), Take 1 capsule by mouth daily. Ultra Flora Plus Capsules, Disp: , Rfl:    Propylene Glycol (SYSTANE COMPLETE) 0.6 % SOLN, Place 1 drop into both eyes in the morning and at bedtime., Disp: , Rfl:    rOPINIRole  (REQUIP ) 0.5 MG tablet, Take 0.5 mg by mouth at bedtime., Disp: , Rfl:    senna (SENOKOT) 8.6 MG tablet, 2 tablets at bedtime as needed Orally Once a day, Disp: , Rfl:    senna-docusate (SENOKOT-S) 8.6-50 MG tablet, Take 2 tablets by mouth at bedtime. For AFTER surgery, do not take if having diarrhea (Patient taking differently: Take 1 tablet by mouth at bedtime.), Disp: 30 tablet, Rfl: 0   simvastatin  (ZOCOR ) 10 MG tablet, Take 10 mg by mouth at bedtime.  , Disp: , Rfl:    sucralfate  (CARAFATE ) 1 GM/10ML suspension, Take 1 g by mouth 2 (two) times daily., Disp: , Rfl:    sulfamethoxazole -trimethoprim  (BACTRIM  DS) 800-160 MG tablet, Take 1 tablet by mouth 2 (two) times daily., Disp: 14 tablet, Rfl: 0   traZODone  (DESYREL ) 50 MG tablet, Take 50 mg by mouth at bedtime., Disp: , Rfl:    vitamin E 400 UNIT capsule, Take 400 Units by mouth daily.  , Disp: , Rfl:    White Petrolatum -Mineral Oil (REFRESH P.M. OP), Place 1 Application into both eyes at bedtime., Disp: , Rfl:    Review of Systems: Denies appetite changes, fevers, chills, fatigue, unexplained weight changes. Denies hearing loss, neck lumps or masses, mouth sores, ringing in ears or voice changes. Denies cough or wheezing.  Denies shortness of breath. Denies chest pain or palpitations. Denies leg swelling. Denies abdominal distention, pain, blood in stools, constipation, diarrhea, nausea, vomiting, or early satiety. Denies pain with intercourse, dysuria, frequency, hematuria or incontinence. Denies hot flashes, pelvic pain, vaginal bleeding.   Denies joint pain, back pain or muscle pain/cramps. Denies itching, rash, or wounds. Denies dizziness, headaches, numbness or seizures. Denies swollen lymph nodes or glands, denies easy bruising or bleeding. Denies anxiety, depression, confusion, or decreased concentration.  Physical Exam: BP 118/61 (BP Location: Left Arm, Patient Position: Sitting)   Pulse (!) 57   Temp 97.6 F (36.4 C) (Oral)   Resp 18   Ht 5\' 3"  (1.6 m)   Wt 212 lb 6.4 oz (96.3 kg)   SpO2 97%   BMI 37.62 kg/m  General: Alert, oriented, no acute distress. HEENT: Posterior oropharynx clear, sclera anicteric. Chest: Unlabored breathing on room air. Abdomen: Obese, soft, nontender.  Area of erythema around both old drain sites where patient appears  to have some dermatitis related to Band-Aid/adhesive use.  Band-Aids were removed.  The erythema on her mons has improved, now less blanching, continued edema. Skin: See above.  Well-healed groin incisions. GU: Significant improvement of vulvar edema.  Loss of size of left labia after recent surgery.  Good granulation tissue noted at the area of dehiscence of her wound.  Minimal fibrinous exudate, no surrounding erythema or induration.  Laboratory & Radiologic Studies:    Latest Ref Rng & Units 07/20/2023   11:01 AM 07/14/2023    1:35 PM 06/29/2023   11:01 AM  CBC  WBC 4.0 - 10.5 K/uL 4.3  7.9  10.1   Hemoglobin 12.0 - 15.0 g/dL 62.1   30.8  65.7   Hematocrit 36.0 - 46.0 % 36.6  38.2  37.6   Platelets 150 - 400 K/uL 227  309  209      Assessment & Plan: RACHAEL FERRIE is a 73 y.o. woman with Stage IIIC SCC of the vulva who presents for follow-up.   Patient is overall doing well, healing well.  Discontinued second course of antibiotics a little bit early secondary to nausea/emesis.  This has resolved.  Overall, exam findings today are most consistent with edema and not ongoing infection.  Plan to see the patient in 1 week to reassess her mons.  Discussed vulvar findings, continued use of sitz bath's and keeping the area clean/dry.  Prescription sent in for low-dose steroid cream to use for dermatitis after Band-Aid use.  Patient met with medical oncology and radiation oncology yesterday.  Plan is for her to start treatment after her grandsons graduation in mid May.  Plan will be for concurrent cisplatin with radiation as well as addition of immunotherapy given advanced disease and PD-L1 CPS of 85%.   If there are any concerns about her continued healing after surgery before starting treatment, we will plan to see her back in mid May.  22 minutes of total time was spent for this patient encounter, including preparation, face-to-face counseling with the patient and coordination of care, and documentation of the encounter.  Wiley Hanger, MD  Division of Gynecologic Oncology  Department of Obstetrics and Gynecology  [facility]

## 2023-07-21 NOTE — Progress Notes (Signed)
 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 female seen at the request of Dr. Orvil Bland.  Earlier this year, she was noted to have an area of ulceration involving the left mid vulva; biopsy was performed, revealing squamous cell carcinoma.  PET/CT imaging was obtained in March; hypermetabolic activity was seen in the left inguinal region.  On 06/28/2023, she underwent partial modified radical left vulvectomy, as well as bilateral inguinal lymphadenectomy, performed by Dr. Orvil Bland.  Pathology revealed invasive squamous cell carcinoma with associated squamous cell carcinoma in situ, measuring 3 cm.  Greatest depth of invasion was 5 mm.  Lymphovascular space invasion was seen.  Left inguinal resection revealed metastatic carcinoma in 3 of 4 lymph nodes, with extensive extranodal tumor.  No carcinoma was present in 2 sampled right inguinal lymph nodes.  She reports doing reasonably well postoperatively.  She is scheduled to see Dr. Orvil Bland in follow-up tomorrow.  Consultation is requested regarding the potential role of radiation in her care.   PREVIOUS RADIATION THERAPY: Yes (Breast)   PAST MEDICAL HISTORY:  Past Medical History:  Diagnosis Date   Anemia    Anxiety    Cancer (HCC)    right breast ILC   Complication of anesthesia    difficulty waking up   Depression    Diverticulitis    Family history of breast cancer    Family history of ovarian cancer    Family history of prostate cancer    Fibromyalgia    GERD (gastroesophageal reflux disease)    Heart murmur    History of colonic polyps    History of hiatal hernia    Hyperlipidemia    Hypertension    IBS (irritable bowel  syndrome)    Ischemic colitis (HCC) 03/2012   Osteoporosis    Pre-diabetes    RLS (restless legs syndrome)    Sinusitis    Sleep apnea    CPAP nightly       PAST SURGICAL HISTORY: Past Surgical History:  Procedure Laterality Date   BREAST LUMPECTOMY WITH RADIOACTIVE SEED LOCALIZATION Right 08/04/2021   Procedure: RIGHT BREAST LUMPECTOMY WITH RADIOACTIVE SEED LOCALIZATION;  Surgeon: Sim Dryer, MD;  Location: [facility];  Service: General;  Laterality: Right;   CHOLECYSTECTOMY  2004   COLONOSCOPY WITH PROPOFOL  N/A 04/18/2017   Procedure: COLONOSCOPY WITH PROPOFOL ;  Surgeon: Tami Falcon, MD;  Location: [facility];  Service: Endoscopy;  Laterality: N/A;   ESOPHAGOGASTRODUODENOSCOPY (EGD) WITH PROPOFOL  N/A 04/18/2017   Procedure: ESOPHAGOGASTRODUODENOSCOPY (EGD) WITH PROPOFOL ;  Surgeon: Tami Falcon, MD;  Location: [facility];  Service: Endoscopy;  Laterality: N/A;   EXAM UNDER ANESTHESIA, PELVIC N/A 06/28/2023   Procedure: EXAM UNDER ANESTHESIA, PELVIC;  Surgeon: Suzi Essex, MD;  Location: [facility];  Service: Gynecology;  Laterality: N/A;   FLEXIBLE SIGMOIDOSCOPY  04/20/2012   Procedure: FLEXIBLE SIGMOIDOSCOPY;  Surgeon: Almeda Aris, MD;  Location: [facility];  Service: Endoscopy;  Laterality: N/A;   INGUINAL LYMPHADENECTOMY N/A 06/28/2023   Procedure: LYMPHADENECTOMY, INGUINAL, OPEN;  Surgeon: Suzi Essex, MD;  Location: [facility];  Service: Gynecology;  Laterality: N/A;   NISSEN FUNDOPLICATION  2011  PARAESOPHAGEAL HERNIA REPAIR  09/11/2009   and Nissen fundoplication   RADICAL VULVECTOMY N/A 06/28/2023   Procedure: VULVECTOMY, RADICAL;  Surgeon: Suzi Essex, MD;  Location: [facility];  Service: Gynecology;  Laterality: N/A;  Possible skin flap   RE-EXCISION OF BREAST LUMPECTOMY Right 08/18/2021   Procedure: RE-EXCISION RIGHT BREAST LUMPECTOMY;  Surgeon: Sim Dryer, MD;  Location: [facility];  Service: General;  Laterality: Right;   SENTINEL  NODE BIOPSY N/A 08/04/2021   Procedure: SENTINEL NODE BIOPSY;  Surgeon: Sim Dryer, MD;  Location: [facility];  Service: General;  Laterality: N/A;   TONSILLECTOMY  1960's   TOTAL ABDOMINAL HYSTERECTOMY  2001   w/ BSO     FAMILY HISTORY:  Family History  Problem Relation Age of Onset   Colon cancer Mother 35   Alzheimer's disease Mother    Heart attack Father    Cancer Maternal Aunt        x2   Alzheimer's disease Maternal Aunt    Ovarian cancer Maternal Aunt    Prostate cancer Maternal Uncle    Kidney disease Maternal Uncle    Atrial fibrillation Maternal Uncle    Stroke Paternal Uncle    Aneurysm Paternal Grandmother        brain   Stroke Paternal Grandfather    Prostate cancer Cousin        paternal first cousin   Prostate cancer Cousin        paternal first cousin   Esophageal cancer Cousin        maternal first cousin   Breast cancer Cousin        DCIS, maternal first cousin   Ovarian cancer Cousin        maternal first cousin   Rheum arthritis Other    Lung disease Neg Hx      SOCIAL HISTORY:  reports that she has never smoked. She has never used smokeless tobacco. She reports that she does not drink alcohol and does not use drugs.   ALLERGIES: Ceftin [cefuroxime axetil], Cefuroxime, Other, and Tramadol hcl   MEDICATIONS:  Current Outpatient Medications  Medication Sig Dispense Refill   acetaminophen  (TYLENOL ) 650 MG CR tablet 1,300 mg in the morning and at bedtime.     ALPRAZolam  (XANAX ) 0.25 MG tablet Take 0.25 mg by mouth at bedtime as needed for anxiety.     amLODipine  (NORVASC ) 5 MG tablet Take 5 mg by mouth daily.     Ascorbic Acid (VITAMIN C) 500 MG CHEW Chew 500 mg by mouth daily.     aspirin EC 81 MG tablet Take 81 mg by mouth at bedtime. Swallow whole.     atenolol  (TENORMIN ) 50 MG tablet Take 75 mg by mouth daily.      benazepril  (LOTENSIN ) 10 MG tablet Take 10 mg by mouth daily.     benzonatate (TESSALON) 100 MG capsule Take 100-200 mg by  mouth at bedtime.     Biotin 1000 MCG tablet Take 1,000 mcg by mouth daily.     Calcium  Citrate-Vitamin D (CITRACAL + D PO) Take 1 tablet by mouth daily.     cetirizine (ZYRTEC) 10 MG tablet Take 10 mg by mouth at bedtime.     Cholecalciferol (VITAMIN D) 50 MCG (2000 UT) tablet Take 2,000 Units by mouth daily.     cyanocobalamin 1000 MCG tablet Take 1,000 mcg by mouth daily.     diclofenac sodium (VOLTAREN) 1 % GEL Apply 1 application  topically daily as needed (for  pain).     diphenhydrAMINE  (BENADRYL ) 25 MG tablet Take 25 mg by mouth every 6 (six) hours as needed for allergies.     diphenoxylate-atropine (LOMOTIL) 2.5-0.025 MG tablet Take 2 tablets by mouth 4 (four) times daily as needed for diarrhea or loose stools.     DULoxetine  (CYMBALTA ) 60 MG capsule Take 120 mg by mouth at bedtime.     EPINEPHrine  (EPIPEN  2-PAK) 0.3 mg/0.3 mL IJ SOAJ injection Inject 0.3 mg into the muscle as needed for anaphylaxis.     [Paused] estradiol  (ESTRACE  VAGINAL) 0.1 MG/GM vaginal cream Place 1 Applicatorful vaginally 3 (three) times a week. 42.5 g 12   famotidine  (PEPCID ) 20 MG tablet Take 20 mg by mouth at bedtime.     fexofenadine (ALLEGRA) 180 MG tablet Take 180 mg by mouth in the morning.     fluticasone  (FLONASE ) 50 MCG/ACT nasal spray Place 1 spray into both nostrils daily.     lidocaine  (XYLOCAINE ) 5 % ointment Apply 1 Application topically 2 (two) times daily as needed for moderate pain (pain score 4-6) (to the vulva). Apply to vulva as needed for discomfort 35.44 g 3   NON FORMULARY Pt uses a c-pap nightly     Nutritional Supplements (JUICE PLUS FIBRE PO) Take 2 each by mouth daily. Fruits and Vegetables     Omega-3 Fatty Acids (FISH OIL) 1000 MG CAPS Take 1,000 mg by mouth daily.      ondansetron  (ZOFRAN ) 4 MG tablet Take 4 mg by mouth every 8 (eight) hours as needed for nausea or vomiting.     pantoprazole  (PROTONIX ) 40 MG tablet Take 40 mg by mouth every evening.     Phenylephrine -APAP-Guaifenesin  (TYLENOL  SINUS SEVERE PO) Take 2 tablets by mouth daily as needed (drainage).     polyvinyl alcohol (LIQUIFILM TEARS) 1.4 % ophthalmic solution Place 1 drop into both eyes 5 (five) times daily.     Probiotic Product (PROBIOTIC & ACIDOPHILUS EX ST PO) Take 1 capsule by mouth daily. Ultra Flora Plus Capsules     Propylene Glycol (SYSTANE COMPLETE) 0.6 % SOLN Place 1 drop into both eyes in the morning and at bedtime.     rOPINIRole  (REQUIP ) 0.5 MG tablet Take 0.5 mg by mouth at bedtime.     senna (SENOKOT) 8.6 MG tablet 2 tablets at bedtime as needed Orally Once a day     senna-docusate (SENOKOT-S) 8.6-50 MG tablet Take 2 tablets by mouth at bedtime. For AFTER surgery, do not take if having diarrhea (Patient taking differently: Take 1 tablet by mouth at bedtime.) 30 tablet 0   simvastatin  (ZOCOR ) 10 MG tablet Take 10 mg by mouth at bedtime.       sucralfate  (CARAFATE ) 1 GM/10ML suspension Take 1 g by mouth 2 (two) times daily.     sulfamethoxazole -trimethoprim  (BACTRIM  DS) 800-160 MG tablet Take 1 tablet by mouth 2 (two) times daily. 14 tablet 0   traZODone  (DESYREL ) 50 MG tablet Take 50 mg by mouth at bedtime.     triamcinolone (KENALOG) 0.025 % ointment Apply 1 Application topically 2 (two) times daily. 30 g 0   vitamin E 400 UNIT capsule Take 400 Units by mouth daily.       White Petrolatum -Mineral Oil (REFRESH P.M. OP) Place 1 Application into both eyes at bedtime.     No current facility-administered medications for this encounter.     REVIEW OF SYSTEMS: On review of systems, the patient reports that she is doing well overall.  She denies  any chest pain, shortness of breath, cough, fevers, chills, night sweats, unintended weight changes.  She denies any bowel or bladder disturbances, and denies abdominal pain, nausea or vomiting.  She denies any new musculoskeletal or joint aches or pains.  She reports no vaginal bleeding or discharge.  A complete review of systems is obtained and is otherwise  negative.     PHYSICAL EXAM:  Wt Readings from Last 3 Encounters:  07/21/23 212 lb 6.4 oz (96.3 kg)  07/20/23 213 lb 8 oz (96.8 kg)  07/14/23 210 lb 6.4 oz (95.4 kg)   Temp Readings from Last 3 Encounters:  07/21/23 97.6 F (36.4 C) (Oral)  07/20/23 (!) 97.5 F (36.4 C) (Oral)  07/14/23 (!) 97.5 F (36.4 C) (Oral)   BP Readings from Last 3 Encounters:  07/21/23 118/61  07/20/23 125/66  07/14/23 114/70   Pulse Readings from Last 3 Encounters:  07/21/23 (!) 57  07/20/23 68  07/14/23 (!) 57     In general this is a well appearing female in no acute distress.  She's alert and oriented x4 and appropriate throughout the examination. Cardiopulmonary assessment is negative for acute distress and she exhibits normal effort.  Examination of her bilateral inguinal regions reveals her well-healed surgical incisions.  Examination of her vulva reveals normal-appearing labia majora.  Examination of her left labia minora reveals a small area of induration, with stitches still in place.  No nodularity is appreciated.    ECOG = 1  0 - Asymptomatic (Fully active, able to carry on all predisease activities without restriction)  1 - Symptomatic but completely ambulatory (Restricted in physically strenuous activity but ambulatory and able to carry out work of a light or sedentary nature. For example, light housework, office work)  2 - Symptomatic, <50% in bed during the day (Ambulatory and capable of all self care but unable to carry out any work activities. Up and about more than 50% of waking hours)  3 - Symptomatic, >50% in bed, but not bedbound (Capable of only limited self-care, confined to bed or chair 50% or more of waking hours)  4 - Bedbound (Completely disabled. Cannot carry on any self-care. Totally confined to bed or chair)  5 - Death   Aurea Blossom MM, Creech RH, Tormey DC, et al. [phone]). "Toxicity and response criteria of the [facility]". Am. Hillard [employer]. Oncol. 5  (6): 649-55    LABORATORY DATA:  Lab Results  Component Value Date   WBC 4.3 07/20/2023   HGB 11.8 (L) 07/20/2023   HCT 36.6 07/20/2023   MCV 86.5 07/20/2023   PLT 227 07/20/2023   Lab Results  Component Value Date   NA 136 07/20/2023   K 4.7 07/20/2023   CL 103 07/20/2023   CO2 24 07/20/2023   Lab Results  Component Value Date   ALT 13 07/20/2023   AST 16 07/20/2023   ALKPHOS 86 07/20/2023   BILITOT 0.2 07/20/2023      RADIOGRAPHY: DG Bone Density Result Date: 07/17/2023 EXAM: DUAL X-RAY ABSORPTIOMETRY (DXA) FOR BONE MINERAL DENSITY 07/17/2023 11:48 am CLINICAL DATA:  73 year old Female Postmenopausal. Screening for osteoporosis Patient is or has been on glucocorticoid therapy. Patient is or has been on bone building therapies. TECHNIQUE: An axial (e.g., hips, spine) and/or appendicular (e.g., radius) exam was performed, as appropriate, using [employer] Secretary/administrator at [employer]. Images are obtained for bone mineral density measurement and are not obtained for diagnostic purposes. [id number] Exclusions: L1 excluded  due to degenerative changes. COMPARISON:  None. New baseline. FINDINGS: Scan quality: Good. LUMBAR SPINE (L2-L4): BMD (in g/cm2): 0.912 T-score: -2.4 Z-score: -0.7 LEFT FEMORAL NECK: BMD (in g/cm2): 0.793 T-score: -1.8 Z-score: 0.0 LEFT TOTAL HIP: BMD (in g/cm2): 0.922 T-score: -0.7 Z-score: 0.9 RIGHT FEMORAL NECK: BMD (in g/cm2): 0.855 T-score: -1.3 Z-score: 0.5 RIGHT TOTAL HIP: BMD (in g/cm2): 0.896 T-score: -0.9 Z-score: 0.7 FRAX 10-YEAR PROBABILITY OF FRACTURE: 10-year fracture risk is performed using the [employer] FRAX calculator based on patient-reported risk factors. Major osteoporotic fracture: 10.5% Hip fracture: 2.0% Other situations known to alter the reliability of the FRAX score should be considered when making treatment decisions, including chronic glucocorticoid use and past treatments. Further guidance on treatment can be found  at the [facility] Osteoporosis Foundation's website https://www.patton.com/. IMPRESSION: Osteopenia based on BMD. Fracture risk is unknown due to history of bone building therapy. RECOMMENDATIONS: 1. All patients should optimize calcium  and vitamin D intake. 2. Consider FDA-approved medical therapies in postmenopausal women and men aged 60 years and older, based on the following: - A hip or vertebral (clinical or morphometric) fracture - T-score less than or equal to -2.5 and secondary causes have been excluded. - Low bone mass (T-score between -1.0 and -2.5) and a 10-year probability of a hip fracture greater than or equal to 3% or a 10-year probability of a major osteoporosis-related fracture greater than or equal to 20% based on the [place] -adapted WHO algorithm. - Clinician judgment and/or patient preferences may indicate treatment for people with 10-year fracture probabilities above or below these levels 3. Patients with diagnosis of osteoporosis or at high risk for fracture should have regular bone mineral density tests. For patients eligible for Medicare, routine testing is allowed once every 2 years. The testing frequency can be increased to one year for patients who have rapidly progressing disease, those who are receiving or discontinuing medical therapy to restore bone mass, or have additional risk factors. Electronically Signed   By: Sundra Engel M.D.   On: 07/17/2023 13:39       IMPRESSION/PLAN: 1.  The patient is a 73 year old female status post partial modified radical left vulvectomy and bilateral inguinal lymphadenectomy.  Surgical pathology revealed extensive extranodal tumor on the left, as well as involvement of 3 out of 4 lymph nodes.  Additionally, lymphovascular space invasion was seen.  I reviewed the patient's pathology with her and her daughter in detail.  I have recommended proceeding with adjuvant radiation to her left vulvar region, as well as her bilateral inguinal lymph nodes, and pelvic lymph  nodes.  I explained the rationale behind this recommendation, as well as the potential side effects of external beam radiation in her clinical scenario.  I explained that these may include, but are not limited to, fatigue, bowel irritation, bladder irritation, and significant irritation of the skin involving her perineum and external genitalia.  I also discussed long-term side effects of radiation, which may include, but are not limited to, chronic injury to bowel, bladder, and other oral tissues.  I also discussed the possibility of vaginal dryness.  She expressed understanding, and is agreeable to proceed.  Of note, she is being seen in consultation by Dr. Almer Jacobson today as well; I will be in touch with Dr. Almer Jacobson regarding coordination of care.  I will make arrangements for Doris Reyes to return to our clinic for simulation and treatment planning.  In a visit lasting 60 minutes, greater than 50% of the time was spent face to  face discussing the patient's condition, in preparation for the discussion, and coordinating the patient's care.    Tosh Glaze A. Cathren Coaster, MD   **Disclaimer: This note was dictated with voice recognition software. Similar sounding words can inadvertently be transcribed and this note may contain transcription errors which may not have been corrected upon publication of note.**

## 2023-07-21 NOTE — Telephone Encounter (Signed)
-----   Message from [name] sent at 07/20/2023  6:13 PM EDT ----- Regarding: port She needs a port placed.  Her breast surgeon is Dr. Afton Horse and [place] and I do not know if he places ports.  If he does, please refer.  If not we will need to refer her to interventional radiology for port placement.

## 2023-07-21 NOTE — Telephone Encounter (Signed)
 Dr. Afton Horse does not port placements.

## 2023-07-21 NOTE — Patient Instructions (Signed)
 You are healing well!  I am sending in a prescription for a low-dose steroid cream that you can put around the skin reaction in either groin.  We will see you next week to ensure continued healing.

## 2023-07-26 DIAGNOSIS — C519 Malignant neoplasm of vulva, unspecified: Secondary | ICD-10-CM | POA: Diagnosis not present

## 2023-07-26 DIAGNOSIS — F411 Generalized anxiety disorder: Secondary | ICD-10-CM | POA: Diagnosis not present

## 2023-07-26 DIAGNOSIS — I10 Essential (primary) hypertension: Secondary | ICD-10-CM | POA: Diagnosis not present

## 2023-07-26 DIAGNOSIS — Z4803 Encounter for change or removal of drains: Secondary | ICD-10-CM | POA: Diagnosis not present

## 2023-07-26 DIAGNOSIS — D630 Anemia in neoplastic disease: Secondary | ICD-10-CM | POA: Diagnosis not present

## 2023-07-26 DIAGNOSIS — Z483 Aftercare following surgery for neoplasm: Secondary | ICD-10-CM | POA: Diagnosis not present

## 2023-07-27 ENCOUNTER — Other Ambulatory Visit: Payer: Self-pay

## 2023-07-27 NOTE — Progress Notes (Signed)
 This information is for discussion at Tumor Board only and is not an official part of the treatment plan.

## 2023-07-28 ENCOUNTER — Other Ambulatory Visit: Payer: Self-pay | Admitting: Radiology

## 2023-07-28 ENCOUNTER — Inpatient Hospital Stay: Attending: Oncology | Admitting: Gynecologic Oncology

## 2023-07-28 ENCOUNTER — Other Ambulatory Visit: Payer: Self-pay | Admitting: Student

## 2023-07-28 VITALS — BP 129/51 | HR 58 | Temp 98.0°F | Resp 18 | Ht 63.0 in | Wt 213.0 lb

## 2023-07-28 DIAGNOSIS — C519 Malignant neoplasm of vulva, unspecified: Secondary | ICD-10-CM

## 2023-07-28 DIAGNOSIS — Z79899 Other long term (current) drug therapy: Secondary | ICD-10-CM | POA: Insufficient documentation

## 2023-07-28 DIAGNOSIS — Z853 Personal history of malignant neoplasm of breast: Secondary | ICD-10-CM | POA: Insufficient documentation

## 2023-07-28 DIAGNOSIS — Z9079 Acquired absence of other genital organ(s): Secondary | ICD-10-CM

## 2023-07-28 DIAGNOSIS — Z90710 Acquired absence of both cervix and uterus: Secondary | ICD-10-CM | POA: Insufficient documentation

## 2023-07-28 DIAGNOSIS — Z90722 Acquired absence of ovaries, bilateral: Secondary | ICD-10-CM | POA: Insufficient documentation

## 2023-07-28 DIAGNOSIS — C774 Secondary and unspecified malignant neoplasm of inguinal and lower limb lymph nodes: Secondary | ICD-10-CM

## 2023-07-28 NOTE — H&P (Incomplete)
 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  Referring Provider(s): Almer Jacobson.C  Supervising Physician: Terrence Ferron  Patient Status: [facility] - Out-pt  History of Present Illness: Doris Reyes is a 73 y.o. female with past medical history significant for anemia, anxiety/depression, right breast cancer 2023, fibromyalgia, GERD, colon polyps, hyperlipidemia, hypertension, IBS, ischemic colitis, osteoporosis, prediabetes, sleep apnea who presents now with newly diagnosed vulvar carcinoma.  She is status post partial modified radical left vulvectomy and bilateral inguinal lymphadenectomy on 06/28/2023.  She presents today for Port-A-Cath placement to assist with treatment.   Patient is Full Code  Past Medical History:  Diagnosis Date   Anemia    Anxiety    Cancer (HCC)    right breast ILC   Complication of anesthesia    difficulty waking up   Depression    Diverticulitis    Family history of breast cancer    Family history of ovarian cancer    Family history of prostate cancer    Fibromyalgia    GERD (gastroesophageal reflux disease)    Heart murmur    History of colonic polyps    History of hiatal hernia    Hyperlipidemia    Hypertension    IBS (irritable bowel syndrome)    Ischemic colitis (HCC) 03/2012   Osteoporosis    Pre-diabetes    RLS (restless legs syndrome)    Sinusitis    Sleep apnea    CPAP nightly    Past Surgical History:  Procedure Laterality Date   BREAST LUMPECTOMY WITH RADIOACTIVE SEED LOCALIZATION Right 08/04/2021   Procedure: RIGHT BREAST LUMPECTOMY WITH RADIOACTIVE SEED LOCALIZATION;  Surgeon: Sim Dryer, MD;  Location: [facility];  Service: General;  Laterality: Right;   CHOLECYSTECTOMY  2004   COLONOSCOPY WITH PROPOFOL  N/A 04/18/2017   Procedure: COLONOSCOPY WITH PROPOFOL ;  Surgeon: Tami Falcon, MD;   Location: [facility];  Service: Endoscopy;  Laterality: N/A;   ESOPHAGOGASTRODUODENOSCOPY (EGD) WITH PROPOFOL  N/A 04/18/2017   Procedure: ESOPHAGOGASTRODUODENOSCOPY (EGD) WITH PROPOFOL ;  Surgeon: Tami Falcon, MD;  Location: [facility];  Service: Endoscopy;  Laterality: N/A;   EXAM UNDER ANESTHESIA, PELVIC N/A 06/28/2023   Procedure: EXAM UNDER ANESTHESIA, PELVIC;  Surgeon: Suzi Essex, MD;  Location: [facility];  Service: Gynecology;  Laterality: N/A;   FLEXIBLE SIGMOIDOSCOPY  04/20/2012   Procedure: FLEXIBLE SIGMOIDOSCOPY;  Surgeon: Almeda Aris, MD;  Location: [facility];  Service: Endoscopy;  Laterality: N/A;   INGUINAL LYMPHADENECTOMY N/A 06/28/2023   Procedure: LYMPHADENECTOMY, INGUINAL, OPEN;  Surgeon: Suzi Essex, MD;  Location: [facility];  Service: Gynecology;  Laterality: N/A;   NISSEN FUNDOPLICATION  2011   PARAESOPHAGEAL HERNIA REPAIR  09/11/2009   and Nissen fundoplication   RADICAL VULVECTOMY N/A 06/28/2023   Procedure: VULVECTOMY, RADICAL;  Surgeon: Suzi Essex, MD;  Location: [facility];  Service: Gynecology;  Laterality: N/A;  Possible skin flap   RE-EXCISION OF BREAST LUMPECTOMY Right 08/18/2021   Procedure: RE-EXCISION RIGHT BREAST LUMPECTOMY;  Surgeon: Sim Dryer, MD;  Location: [facility];  Service: General;  Laterality: Right;   SENTINEL NODE BIOPSY N/A 08/04/2021   Procedure: SENTINEL NODE BIOPSY;  Surgeon: Sim Dryer, MD;  Location: [facility];  Service: General;  Laterality: N/A;   TONSILLECTOMY  1960's   TOTAL ABDOMINAL HYSTERECTOMY  2001   w/ BSO    Allergies: Ceftin [cefuroxime axetil], Cefuroxime, Other, and Tramadol hcl  Medications: Prior to Admission medications   Medication Sig Start Date End Date Taking? Authorizing Provider  acetaminophen  (TYLENOL ) 650 MG CR tablet 1,300 mg in the morning and at bedtime.    [provider]  ALPRAZolam  (XANAX ) 0.25 MG tablet Take 0.25 mg by mouth at bedtime as needed for  anxiety.    [provider]  amLODipine  (NORVASC ) 5 MG tablet Take 5 mg by mouth daily.    [provider]  Ascorbic Acid (VITAMIN C) 500 MG CHEW Chew 500 mg by mouth daily.    [provider]  aspirin EC 81 MG tablet Take 81 mg by mouth at bedtime. Swallow whole.    [provider]  atenolol  (TENORMIN ) 50 MG tablet Take 75 mg by mouth daily.     [provider]  benazepril  (LOTENSIN ) 10 MG tablet Take 10 mg by mouth daily.    [provider]  benzonatate (TESSALON) 100 MG capsule Take 100-200 mg by mouth at bedtime.    [provider]  Biotin 1000 MCG tablet Take 1,000 mcg by mouth daily.    [provider]  Calcium  Citrate-Vitamin D (CITRACAL + D PO) Take 1 tablet by mouth daily.    [provider]  cetirizine (ZYRTEC) 10 MG tablet Take 10 mg by mouth at bedtime.    [provider]  Cholecalciferol (VITAMIN D) 50 MCG (2000 UT) tablet Take 2,000 Units by mouth daily.    [provider]  cyanocobalamin 1000 MCG tablet Take 1,000 mcg by mouth daily.    [provider]  diclofenac sodium (VOLTAREN) 1 % GEL Apply 1 application  topically daily as needed (for pain). 09/21/12   [provider]  diphenhydrAMINE  (BENADRYL ) 25 MG tablet Take 25 mg by mouth every 6 (six) hours as needed for allergies.    [provider]  diphenoxylate-atropine (LOMOTIL) 2.5-0.025 MG tablet Take 2 tablets by mouth 4 (four) times daily as needed for diarrhea or loose stools.    [provider]  DULoxetine  (CYMBALTA ) 60 MG capsule Take 120 mg by mouth at bedtime.    [provider]  EPINEPHrine  (EPIPEN  2-PAK) 0.3 mg/0.3 mL IJ SOAJ injection Inject 0.3 mg into the muscle as needed for anaphylaxis.    [provider]  estradiol  (ESTRACE  VAGINAL) 0.1 MG/GM vaginal cream Place 1 Applicatorful vaginally 3 (three) times a week. 12/02/22   Suzi Essex, MD  famotidine  (PEPCID ) 20  MG tablet Take 20 mg by mouth at bedtime.    [provider]  fexofenadine (ALLEGRA) 180 MG tablet Take 180 mg by mouth in the morning.    [provider]  fluticasone  (FLONASE ) 50 MCG/ACT nasal spray Place 1 spray into both nostrils daily.    [provider]  lidocaine  (XYLOCAINE ) 5 % ointment Apply 1 Application topically 2 (two) times daily as needed for moderate pain (pain score 4-6) (to the vulva). Apply to vulva as needed for discomfort 06/05/23   Cross, Melissa D, NP  NON FORMULARY Pt uses a c-pap nightly    [provider]  Nutritional Supplements (JUICE PLUS FIBRE PO) Take 2 each by mouth daily. Fruits and Vegetables    [provider]  Omega-3 Fatty Acids (FISH OIL) 1000 MG CAPS Take 1,000 mg by mouth daily.     [provider]  ondansetron  (ZOFRAN ) 4 MG tablet Take 4 mg by mouth every 8 (eight) hours as needed for nausea or vomiting.    [provider]  pantoprazole  (PROTONIX ) 40 MG tablet Take 40 mg by mouth every evening.    [provider]  Phenylephrine -APAP-Guaifenesin (TYLENOL  SINUS SEVERE PO) Take 2 tablets by mouth daily as needed (drainage).    [provider]  polyvinyl alcohol (LIQUIFILM TEARS) 1.4 % ophthalmic solution Place 1 drop into both eyes 5 (five) times daily.    [provider]  Probiotic Product (PROBIOTIC & ACIDOPHILUS EX ST PO) Take 1 capsule by mouth daily. Ultra Flora Plus Capsules    [provider]  Propylene Glycol (SYSTANE COMPLETE) 0.6 % SOLN Place 1 drop into both eyes in the morning and at bedtime.    [provider]  rOPINIRole  (REQUIP ) 0.5 MG tablet Take 0.5 mg by mouth at bedtime.    [provider]  senna (SENOKOT) 8.6 MG tablet 2 tablets at bedtime as needed Orally Once a day    [provider]  senna-docusate (SENOKOT-S) 8.6-50 MG tablet Take 2 tablets by mouth at bedtime. For AFTER surgery, do not take if having  diarrhea Patient taking differently: Take 1 tablet by mouth at bedtime. 06/09/23   Cross, Melissa D, NP  simvastatin  (ZOCOR ) 10 MG tablet Take 10 mg by mouth at bedtime.      [provider]  sucralfate  (CARAFATE ) 1 GM/10ML suspension Take 1 g by mouth 2 (two) times daily.    [provider]  sulfamethoxazole -trimethoprim  (BACTRIM  DS) 800-160 MG tablet Take 1 tablet by mouth 2 (two) times daily. 07/14/23   Cross, Melissa D, NP  traZODone  (DESYREL ) 50 MG tablet Take 50 mg by mouth at bedtime.    [provider]  triamcinolone  (KENALOG ) 0.025 % ointment Apply 1 Application topically 2 (two) times daily. 07/21/23   Tucker, Katherine R, MD  vitamin E 400 UNIT capsule Take 400 Units by mouth daily.      [provider]  White Petrolatum -Mineral Oil (REFRESH P.M. OP) Place 1 Application into both eyes at bedtime.    [provider]     Family History  Problem Relation Age of Onset   Colon cancer Mother 51   Alzheimer's disease Mother    Heart attack Father    Cancer Maternal Aunt        x2   Alzheimer's disease Maternal Aunt    Ovarian cancer Maternal Aunt    Prostate cancer Maternal Uncle    Kidney disease Maternal Uncle    Atrial fibrillation Maternal Uncle    Stroke Paternal Uncle    Aneurysm Paternal Grandmother        brain   Stroke Paternal Grandfather    Prostate cancer Cousin        paternal first cousin   Prostate cancer Cousin        paternal first cousin   Esophageal cancer Cousin        maternal first cousin   Breast cancer Cousin        DCIS, maternal first cousin   Ovarian cancer Cousin        maternal first cousin   Rheum arthritis Other    Lung disease Neg Hx     Social History   Socioeconomic History   Marital status: Widowed    Spouse name: Royston Cornea   Number of children: 1   Years of education: Not on file   Highest education level: Some college, no degree  Occupational History   Occupation: disabled  Tobacco Use    Smoking status: Never   Smokeless tobacco: Never  Tobacco comments:    brief exposure through her husband  Vaping Use   Vaping status: Never Used  Substance and Sexual Activity   Alcohol use: Never    Alcohol/week: 0.0 standard drinks of alcohol   Drug use: Never   Sexual activity: Not Currently  Other Topics Concern   Not on file  Social History Narrative   From [place] originally. Previously lived in [place]. Previously worked at [employer] also in a [employer]. Has also worked as a Diplomatic Services operational officer for [employer]. No pets currently. No bird exposure. No known mold in her current home.    Lives with husband   Caffeine- coffee 2 c daily   Social Drivers of Health   Financial Resource Strain: Low Risk  (02/01/2022)   Overall Financial Resource Strain (CARDIA)    Difficulty of Paying Living Expenses: Not hard at all  Food Insecurity: No Food Insecurity (06/28/2023)   Hunger Vital Sign    Worried About Running Out of Food in the Last Year: Never true    Ran Out of Food in the Last Year: Never true  Transportation Needs: No Transportation Needs (06/28/2023)   PRAPARE - Administrator, Civil Service (Medical): No    Lack of Transportation (Non-Medical): No  Physical Activity: Not on file  Stress: Not on file  Social Connections: Moderately Isolated (06/28/2023)   Social Connection and Isolation Panel [NHANES]    Frequency of Communication with Friends and Family: More than three times a week    Frequency of Social Gatherings with Friends and Family: Twice a week    Attends Religious Services: More than 4 times per year    Active Member of [employer] or Organizations: No    Attends Banker Meetings: Never    Marital Status: Widowed       Review of Systems denies fever, chest pain, dyspnea, abdominal pain, nausea, vomiting.  She does have headache, occasional cough, back pain, occasional vaginal bleeding  Vital Signs: Vitals:   07/31/23 0751  BP: 117/70   Pulse: (!) 54  Resp: 16  Temp: 98 F (36.7 C)  SpO2: 95%      Advance Care Plan: No documents on file     Physical Exam awake, alert.  Chest clear to auscultation bilaterally.  Heart with regular rate and rhythm.  Abdomen obese, soft, positive bowel sounds, nontender.  No lower extremity edema.  Imaging: DG Bone Density Result Date: 07/17/2023 EXAM: DUAL X-RAY ABSORPTIOMETRY (DXA) FOR BONE MINERAL DENSITY 07/17/2023 11:48 am CLINICAL DATA:  73 year old Female Postmenopausal. Screening for osteoporosis Patient is or has been on glucocorticoid therapy. Patient is or has been on bone building therapies. TECHNIQUE: An axial (e.g., hips, spine) and/or appendicular (e.g., radius) exam was performed, as appropriate, using GE Secretary/administrator at [employer]. Images are obtained for bone mineral density measurement and are not obtained for diagnostic purposes. [id number] Exclusions: L1 excluded due to degenerative changes. COMPARISON:  None. New baseline. FINDINGS: Scan quality: Good. LUMBAR SPINE (L2-L4): BMD (in g/cm2): 0.912 T-score: -2.4 Z-score: -0.7 LEFT FEMORAL NECK: BMD (in g/cm2): 0.793 T-score: -1.8 Z-score: 0.0 LEFT TOTAL HIP: BMD (in g/cm2): 0.922 T-score: -0.7 Z-score: 0.9 RIGHT FEMORAL NECK: BMD (in g/cm2): 0.855 T-score: -1.3 Z-score: 0.5 RIGHT TOTAL HIP: BMD (in g/cm2): 0.896 T-score: -0.9 Z-score: 0.7 FRAX 10-YEAR PROBABILITY OF FRACTURE: 10-year fracture risk is performed using the [employer] FRAX calculator based on patient-reported risk factors. Major osteoporotic fracture: 10.5% Hip  fracture: 2.0% Other situations known to alter the reliability of the FRAX score should be considered when making treatment decisions, including chronic glucocorticoid use and past treatments. Further guidance on treatment can be found at the [facility]oporosis Foundation's website [email or web address] IMPRESSION: Osteopenia based on BMD. Fracture risk is unknown due to  history of bone building therapy. RECOMMENDATIONS: 1. All patients should optimize calcium  and vitamin D intake. 2. Consider FDA-approved medical therapies in postmenopausal women and men aged 35 years and older, based on the following: - A hip or vertebral (clinical or morphometric) fracture - T-score less than or equal to -2.5 and secondary causes have been excluded. - Low bone mass (T-score between -1.0 and -2.5) and a 10-year probability of a hip fracture greater than or equal to 3% or a 10-year probability of a major osteoporosis-related fracture greater than or equal to 20% based on the [place] -adapted WHO algorithm. - Clinician judgment and/or patient preferences may indicate treatment for people with 10-year fracture probabilities above or below these levels 3. Patients with diagnosis of osteoporosis or at high risk for fracture should have regular bone mineral density tests. For patients eligible for Medicare, routine testing is allowed once every 2 years. The testing frequency can be increased to one year for patients who have rapidly progressing disease, those who are receiving or discontinuing medical therapy to restore bone mass, or have additional risk factors. Electronically Signed   By: Sundra Engel M.D.   On: 07/17/2023 13:39    Labs:  CBC: Recent Labs    06/22/23 1333 06/29/23 1101 07/14/23 1335 07/20/23 1101  WBC 5.7 10.1 7.9 4.3  HGB 13.5 11.8* 12.8 11.8*  HCT 43.1 37.6 38.2 36.6  PLT 192 209 309 227    COAGS: No results for input(s): "INR", "APTT" in the last 8760 hours.  BMP: Recent Labs    04/06/23 1538 06/22/23 1333 06/29/23 1101 07/20/23 1101  NA 140 138 139 136  K 4.0 4.0 3.8 4.7  CL 102 104 104 103  CO2 28 26 25 24   GLUCOSE 108* 106* 117* 110*  BUN 15 16 18 16   CALCIUM  10.0 9.5 8.8* 9.7  CREATININE 0.98 0.82 0.83 1.02*  GFRNONAA >60 >60 >60 58*    LIVER FUNCTION TESTS: Recent Labs    10/28/22 1109 04/06/23 1538 06/22/23 1333 07/20/23 1101  BILITOT  0.7 0.3 0.5 0.2  AST 22 27 21 16   ALT 32 32 24 13  ALKPHOS 51 72 56 86  PROT 6.9 7.0 6.8 6.3*  ALBUMIN 4.0 4.5 4.3 4.1    TUMOR MARKERS: No results for input(s): "AFPTM", "CEA", "CA199", "CHROMGRNA" in the last 8760 hours.  Assessment and Plan: 73 y.o. female with past medical history significant for anemia, anxiety/depression, right breast cancer 2023, fibromyalgia, GERD, colon polyps, hyperlipidemia, hypertension, IBS, ischemic colitis, osteoporosis, prediabetes, sleep apnea who presents now with newly diagnosed vulvar carcinoma.  She is status post partial modified radical left vulvectomy and bilateral inguinal lymphadenectomy on 06/28/2023.  She presents today for Port-A-Cath placement to assist with treatment.Risks and benefits of image guided port-a-catheter placement was discussed with the patient including, but not limited to bleeding, infection, pneumothorax, or fibrin sheath development and need for additional procedures.  All of the patient's questions were answered, patient is agreeable to proceed. Consent signed and in chart.    Thank you for allowing our service to participate in Doris Reyes 's care.  Electronically Signed: D. Honore Lux, PA-C   07/28/2023, 2:57 PM  I spent a total of  15 Minutes   in face to face in clinical consultation, greater than 50% of which was counseling/coordinating care for Port-A-Cath placement

## 2023-07-28 NOTE — Patient Instructions (Addendum)
 You continue to heal well from surgery. The mild redness at the top of the vulva has not changed since the last visit and is soft.   If you notice any changes including increased redness, warmth, swelling, tenderness please call to be seen soon.   Continue with the daily sitz baths.   We can see you any time in between your upcoming appointments for any concerns or new symptoms.   Please call for any needs or concerns and we will plan on seeing you in the office after the completion of treatment.

## 2023-07-28 NOTE — Progress Notes (Unsigned)
 Gynecologic Oncology Return Clinic Visit  07/28/23  Reason for Visit: follow-up  Treatment History: Oncology History  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)  06/08/2021 Initial Diagnosis   Breast cancer in female [facility])   07/23/2021 Genetic Testing   Negative hereditary cancer genetic testing: no pathogenic variants detected in [facility] BRCAPlus Panel. Report date is July 23, 2021.  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  The report date is Jul 26, 2021.  The BRCAplus panel offered by [employer] and includes sequencing and deletion/duplication analysis for the following 8 genes: ATM, BRCA1, BRCA2, CDH1, CHEK2, PALB2, PTEN, and TP53.  Results of pan-cancer panel pending.   The CancerNext-Expanded gene panel offered by [facility] and includes sequencing and rearrangement analysis for the following 77 genes: AIP, ALK, APC*, ATM*, AXIN2, BAP1, BARD1, BLM, BMPR1A, BRCA1*, BRCA2*, BRIP1*, CDC73, CDH1*, CDK4, CDKN1B, CDKN2A, CHEK2*, CTNNA1, DICER1, FANCC, FH, FLCN, GALNT12, KIF1B, LZTR1, MAX, MEN1, MET, MLH1*, MSH2*, MSH3, MSH6*, MUTYH*, NBN, NF1*, NF2, NTHL1, PALB2*, PHOX2B, PMS2*, POT1, PRKAR1A, PTCH1, PTEN*, RAD51C*, RAD51D*, RB1, RECQL, RET, SDHA, SDHAF2, SDHB, SDHC, SDHD, SMAD4, SMARCA4, SMARCB1, SMARCE1, STK11, SUFU, TMEM127, TP53*, TSC1, TSC2, VHL and XRCC2 (sequencing and deletion/duplication); EGFR, EGLN1, HOXB13, KIT, MITF, PDGFRA, POLD1, and POLE (sequencing only); EPCAM and GREM1 (deletion/duplication only). DNA and RNA analyses performed for * genes.    08/23/2021 Cancer Staging   Staging form: Breast, AJCC 8th Edition - Pathologic stage from 08/23/2021: Stage IA (pT2(2), pN0(sn), cM0, G1, ER+, PR+, HER2-) - Signed by [name], MD on 09/29/2021 Histopathologic type: Lobular carcinoma, NOS Stage prefix: Initial diagnosis Method of  lymph node assessment: Sentinel lymph node biopsy Nuclear grade: G1 Multigene prognostic tests performed: EndoPredict Histologic grading system: 3 grade system Residual tumor (R): R0 - None Laterality: Right Tumor size (mm): 24 Multiple tumors: Yes Number of tumors: 2 Lymph-vascular invasion (LVI): LVI not present (absent)/not identified Diagnostic confirmation: Positive histology PLUS positive immunophenotyping and/or positive genetic studies Specimen type: Excision Staged by: Managing physician Menopausal status: Postmenopausal Ki-67 (%): 5 Stage used in treatment planning: Yes National guidelines used in treatment planning: Yes Type of national guideline used in treatment planning: NCCN    The patient endorsed having occasional abnormal pap smears prior to her hysterectomy in 2011 for fibroids. She denied ever having cervical biopsies or procedures done, thinks that Pap smears were just repeated. After her hysterectomy, she had a period of time where she did not get follow-up. Since establishing with her OB/GYN provider, she has been getting Pap smears which were initially normal. Her more recent Pap history is noted below.    09/21/20: ASCUS pap 09/27/2021: ASC-H pap 11/03/21: Vaginoscopy. 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 Biopsy shows squamous mucosa with mild koilocytic atypia c/w LSIL lesion. Vaginal biopsy 10/2022: HPV effect with mild dysplasia (VAIN1). Started using vaginal estrogen   Seen on 05/11/23. Reports new area within the vagina, some pruritus and pain. 1 cm area of ulceration noted along the left mid vulva with tenderness on palpation. Biopsy recommended - she preferred to come back when she had a driver.  Biopsy performed on 05/25/23, revealed grade 2 SCC, DOI at least 1.25 mm.    06/05/23: PET shows  hypermetabolic left inguinal lymph nodes measuring up to  1.8 cm. No other hypermetabolism noted.    On 06/28/2023, she underwent partial modified radical left vulvectomy, bilateral inguinal lymphadenectomy with Dr. Wiley Hanger. Her post-operative course was overall non-complicated. She stayed in the hospital until POD 2 with improved mobility at that time. Final path returned with: A. LYMPH NODE, LEFT INGUINAL, RESECTION:  Metastatic carcinoma in three of four lymph nodes (3/4).  Largest metastasis is 3 cm.  Extensive extranodal tumor.   B. LYMPH NODE, RIGHT INGUINAL, RESECTION:  Two lymph nodes negative for metastatic carcinoma (0/2).   C. VULVA, LEFT STITCH AT 12, VULVECTOMY:  Invasive squamous cell carcinoma associated with squamous cell carcinoma in situ, 3 cm.  Greatest depth of invasion is 5 mm.  Carcinoma in situ focally involves the 6:00 margin.  Invasive carcinoma focally less than 1 mm from 6:00 margin.  Lymphovascular space involvement by carcinoma.  See oncology table and comment.   D. LATERAL DEEP MARGIN:  Benign adipose tissue and connective tissue.  Negative for carcinoma.  Final lateral deep margin negative for carcinoma.   Interval History: Patient presents today for continued postop follow-up.  She reports overall doing well.  She is tolerating her diet with no nausea or emesis.  No fever or chills.  No pain reported.  No lower extremity edema reported.  Emptying bladder without difficulty.  Having bowel movements and trying to avoid straining.  She does feel like she may have a small fissure in the perineal area that can be sore at times.  Still has mild vaginal drainage with spotting.  The groin areas are improving and she continues to use the topical steroid cream as needed.  The mons erythema has not changed significantly and is not increased in tenderness.  She has plans for Port-A-Cath placement this upcoming Monday.  Her brother is coming to visit over the weekend  to stay and take her to this appointment. No concerning symptoms voiced.   Past Medical/Surgical History: Past Medical History:  Diagnosis Date   Anemia    Anxiety    Cancer (HCC)    right breast ILC   Complication of anesthesia    difficulty waking up   Depression    Diverticulitis    Family history of breast cancer    Family history of ovarian cancer    Family history of prostate cancer    Fibromyalgia    GERD (gastroesophageal reflux disease)    Heart murmur    History of colonic polyps    History of hiatal hernia    Hyperlipidemia    Hypertension    IBS (irritable bowel syndrome)    Ischemic colitis (HCC) 03/2012   Osteoporosis    Pre-diabetes    RLS (restless legs syndrome)    Sinusitis    Sleep apnea    CPAP nightly    Past Surgical History:  Procedure Laterality Date   BREAST LUMPECTOMY WITH RADIOACTIVE SEED LOCALIZATION Right 08/04/2021   Procedure: RIGHT BREAST LUMPECTOMY WITH RADIOACTIVE SEED LOCALIZATION;  Surgeon: Sim Dryer, MD;  Location: [facility];  Service: General;  Laterality: Right;   CHOLECYSTECTOMY  2004   COLONOSCOPY WITH PROPOFOL  N/A 04/18/2017   Procedure: COLONOSCOPY WITH PROPOFOL ;  Surgeon: Tami Falcon, MD;  Location: [facility];  Service: Endoscopy;  Laterality: N/A;   ESOPHAGOGASTRODUODENOSCOPY (EGD) WITH PROPOFOL  N/A 04/18/2017   Procedure: ESOPHAGOGASTRODUODENOSCOPY (EGD) WITH PROPOFOL ;  Surgeon: Tami Falcon, MD;  Location: [facility];  Service: Endoscopy;  Laterality: N/A;   EXAM UNDER ANESTHESIA, PELVIC N/A 06/28/2023   Procedure:  EXAM UNDER ANESTHESIA, PELVIC;  Surgeon: Suzi Essex, MD;  Location: [facility];  Service: Gynecology;  Laterality: N/A;   FLEXIBLE SIGMOIDOSCOPY  04/20/2012   Procedure: FLEXIBLE SIGMOIDOSCOPY;  Surgeon: Almeda Aris, MD;  Location: [facility];  Service: Endoscopy;  Laterality: N/A;   INGUINAL LYMPHADENECTOMY N/A 06/28/2023   Procedure: LYMPHADENECTOMY, INGUINAL, OPEN;  Surgeon: Suzi Essex,  MD;  Location: [facility];  Service: Gynecology;  Laterality: N/A;   NISSEN FUNDOPLICATION  2011   PARAESOPHAGEAL HERNIA REPAIR  09/11/2009   and Nissen fundoplication   RADICAL VULVECTOMY N/A 06/28/2023   Procedure: VULVECTOMY, RADICAL;  Surgeon: Suzi Essex, MD;  Location: [facility];  Service: Gynecology;  Laterality: N/A;  Possible skin flap   RE-EXCISION OF BREAST LUMPECTOMY Right 08/18/2021   Procedure: RE-EXCISION RIGHT BREAST LUMPECTOMY;  Surgeon: Sim Dryer, MD;  Location: [facility];  Service: General;  Laterality: Right;   SENTINEL NODE BIOPSY N/A 08/04/2021   Procedure: SENTINEL NODE BIOPSY;  Surgeon: Sim Dryer, MD;  Location: [facility];  Service: General;  Laterality: N/A;   TONSILLECTOMY  1960's   TOTAL ABDOMINAL HYSTERECTOMY  2001   w/ BSO    Family History  Problem Relation Age of Onset   Colon cancer Mother 55   Alzheimer's disease Mother    Heart attack Father    Cancer Maternal Aunt        x2   Alzheimer's disease Maternal Aunt    Ovarian cancer Maternal Aunt    Prostate cancer Maternal Uncle    Kidney disease Maternal Uncle    Atrial fibrillation Maternal Uncle    Stroke Paternal Uncle    Aneurysm Paternal Grandmother        brain   Stroke Paternal Grandfather    Prostate cancer Cousin        paternal first cousin   Prostate cancer Cousin        paternal first cousin   Esophageal cancer Cousin        maternal first cousin   Breast cancer Cousin        DCIS, maternal first cousin   Ovarian cancer Cousin        maternal first cousin   Rheum arthritis Other    Lung disease Neg Hx     Social History   Socioeconomic History   Marital status: Widowed    Spouse name: Royston Cornea   Number of children: 1   Years of education: Not on file   Highest education level: Some college, no degree  Occupational History   Occupation: disabled  Tobacco Use   Smoking status: Never   Smokeless tobacco: Never   Tobacco comments:    brief exposure  through her husband  Vaping Use   Vaping status: Never Used  Substance and Sexual Activity   Alcohol use: Never    Alcohol/week: 0.0 standard drinks of alcohol   Drug use: Never   Sexual activity: Not Currently  Other Topics Concern   Not on file  Social History Narrative   From [place] originally. Previously lived in [place]. Previously worked at [employer] also in a [employer]. Has also worked as a Diplomatic Services operational officer for [employer]. No pets currently. No bird exposure. No known mold in her current home.    Lives with husband   Caffeine- coffee 2 c daily   Social Drivers of Health   Financial Resource Strain: Low Risk  (02/01/2022)   Overall Financial Resource Strain (CARDIA)    Difficulty  of Paying Living Expenses: Not hard at all  Food Insecurity: No Food Insecurity (06/28/2023)   Hunger Vital Sign    Worried About Running Out of Food in the Last Year: Never true    Ran Out of Food in the Last Year: Never true  Transportation Needs: No Transportation Needs (06/28/2023)   PRAPARE - Administrator, Civil Service (Medical): No    Lack of Transportation (Non-Medical): No  Physical Activity: Not on file  Stress: Not on file  Social Connections: Moderately Isolated (06/28/2023)   Social Connection and Isolation Panel [NHANES]    Frequency of Communication with Friends and Family: More than three times a week    Frequency of Social Gatherings with Friends and Family: Twice a week    Attends Religious Services: More than 4 times per year    Active Member of [employer] or Organizations: No    Attends Banker Meetings: Never    Marital Status: Widowed    Current Medications:  Current Outpatient Medications:    acetaminophen  (TYLENOL ) 650 MG CR tablet, 1,300 mg in the morning and at bedtime., Disp: , Rfl:    ALPRAZolam  (XANAX ) 0.25 MG tablet, Take 0.25 mg by mouth at bedtime as needed for anxiety., Disp: , Rfl:    amLODipine  (NORVASC ) 5 MG tablet, Take 5 mg by mouth  daily., Disp: , Rfl:    Ascorbic Acid (VITAMIN C) 500 MG CHEW, Chew 500 mg by mouth daily., Disp: , Rfl:    aspirin EC 81 MG tablet, Take 81 mg by mouth at bedtime. Swallow whole., Disp: , Rfl:    atenolol  (TENORMIN ) 50 MG tablet, Take 75 mg by mouth daily. , Disp: , Rfl:    benazepril  (LOTENSIN ) 10 MG tablet, Take 10 mg by mouth daily., Disp: , Rfl:    benzonatate (TESSALON) 100 MG capsule, Take 100-200 mg by mouth at bedtime., Disp: , Rfl:    Biotin 1000 MCG tablet, Take 1,000 mcg by mouth daily., Disp: , Rfl:    Calcium  Citrate-Vitamin D (CITRACAL + D PO), Take 1 tablet by mouth daily., Disp: , Rfl:    cetirizine (ZYRTEC) 10 MG tablet, Take 10 mg by mouth at bedtime., Disp: , Rfl:    Cholecalciferol (VITAMIN D) 50 MCG (2000 UT) tablet, Take 2,000 Units by mouth daily., Disp: , Rfl:    cyanocobalamin 1000 MCG tablet, Take 1,000 mcg by mouth daily., Disp: , Rfl:    diclofenac sodium (VOLTAREN) 1 % GEL, Apply 1 application  topically daily as needed (for pain)., Disp: , Rfl:    diphenhydrAMINE  (BENADRYL ) 25 MG tablet, Take 25 mg by mouth every 6 (six) hours as needed for allergies., Disp: , Rfl:    diphenoxylate-atropine (LOMOTIL) 2.5-0.025 MG tablet, Take 2 tablets by mouth 4 (four) times daily as needed for diarrhea or loose stools., Disp: , Rfl:    DULoxetine  (CYMBALTA ) 60 MG capsule, Take 120 mg by mouth at bedtime., Disp: , Rfl:    EPINEPHrine  (EPIPEN  2-PAK) 0.3 mg/0.3 mL IJ SOAJ injection, Inject 0.3 mg into the muscle as needed for anaphylaxis., Disp: , Rfl:    estradiol  (ESTRACE  VAGINAL) 0.1 MG/GM vaginal cream, Place 1 Applicatorful vaginally 3 (three) times a week., Disp: 42.5 g, Rfl: 12   famotidine  (PEPCID ) 20 MG tablet, Take 20 mg by mouth at bedtime., Disp: , Rfl:    fexofenadine (ALLEGRA) 180 MG tablet, Take 180 mg by mouth in the morning., Disp: , Rfl:    fluticasone  (FLONASE ) 50  MCG/ACT nasal spray, Place 1 spray into both nostrils daily., Disp: , Rfl:    lidocaine  (XYLOCAINE ) 5  % ointment, Apply 1 Application topically 2 (two) times daily as needed for moderate pain (pain score 4-6) (to the vulva). Apply to vulva as needed for discomfort, Disp: 35.44 g, Rfl: 3   NON FORMULARY, Pt uses a c-pap nightly, Disp: , Rfl:    Nutritional Supplements (JUICE PLUS FIBRE PO), Take 2 each by mouth daily. Fruits and Vegetables, Disp: , Rfl:    Omega-3 Fatty Acids (FISH OIL) 1000 MG CAPS, Take 1,000 mg by mouth daily. , Disp: , Rfl:    ondansetron  (ZOFRAN ) 4 MG tablet, Take 4 mg by mouth every 8 (eight) hours as needed for nausea or vomiting., Disp: , Rfl:    pantoprazole  (PROTONIX ) 40 MG tablet, Take 40 mg by mouth every evening., Disp: , Rfl:    Phenylephrine -APAP-Guaifenesin (TYLENOL  SINUS SEVERE PO), Take 2 tablets by mouth daily as needed (drainage)., Disp: , Rfl:    polyvinyl alcohol (LIQUIFILM TEARS) 1.4 % ophthalmic solution, Place 1 drop into both eyes 5 (five) times daily., Disp: , Rfl:    Probiotic Product (PROBIOTIC & ACIDOPHILUS EX ST PO), Take 1 capsule by mouth daily. Ultra Flora Plus Capsules, Disp: , Rfl:    Propylene Glycol (SYSTANE COMPLETE) 0.6 % SOLN, Place 1 drop into both eyes in the morning and at bedtime., Disp: , Rfl:    rOPINIRole  (REQUIP ) 0.5 MG tablet, Take 0.5 mg by mouth at bedtime., Disp: , Rfl:    senna (SENOKOT) 8.6 MG tablet, 2 tablets at bedtime as needed Orally Once a day, Disp: , Rfl:    senna-docusate (SENOKOT-S) 8.6-50 MG tablet, Take 2 tablets by mouth at bedtime. For AFTER surgery, do not take if having diarrhea (Patient taking differently: Take 1 tablet by mouth at bedtime.), Disp: 30 tablet, Rfl: 0   simvastatin  (ZOCOR ) 10 MG tablet, Take 10 mg by mouth at bedtime.  , Disp: , Rfl:    sucralfate  (CARAFATE ) 1 GM/10ML suspension, Take 1 g by mouth 2 (two) times daily., Disp: , Rfl:    sulfamethoxazole -trimethoprim  (BACTRIM  DS) 800-160 MG tablet, Take 1 tablet by mouth 2 (two) times daily., Disp: 14 tablet, Rfl: 0   traZODone  (DESYREL ) 50 MG tablet,  Take 50 mg by mouth at bedtime., Disp: , Rfl:    triamcinolone  (KENALOG ) 0.025 % ointment, Apply 1 Application topically 2 (two) times daily., Disp: 30 g, Rfl: 0   vitamin E 400 UNIT capsule, Take 400 Units by mouth daily.  , Disp: , Rfl:    White Petrolatum -Mineral Oil (REFRESH P.M. OP), Place 1 Application into both eyes at bedtime., Disp: , Rfl:   Review of Systems: See interval. Additional review is negative.  Physical Exam: BP (!) 129/51 (BP Location: Left Arm, Patient Position: Sitting)   Pulse (!) 58   Temp 98 F (36.7 C) (Oral)   Resp 18   Ht 5\' 3"  (1.6 m)   Wt 213 lb (96.6 kg)   SpO2 99%   BMI 37.73 kg/m  General: Alert, oriented, no acute distress. HEENT: Sclera anicteric. Chest: Unlabored breathing on room air. Lungs clear. Heart regular in rate and rhythm.  Abdomen: Obese, soft, nontender.  Area of erythema around both old drain sites has improved. The erythema on her mons is stable, now less blanching, with no increased warmth. Edema is slightly less. Skin: Well-healed groin incisions. GU: Significant improvement of vulvar edema.  Loss of size of left labia  after recent surgery.  Good granulation tissue noted at the area of dehiscence of her wound.  Minimal fibrinous exudate, no surrounding erythema or induration.  Laboratory & Radiologic Studies:    Latest Ref Rng & Units 07/20/2023   11:01 AM 07/14/2023    1:35 PM 06/29/2023   11:01 AM  CBC  WBC 4.0 - 10.5 K/uL 4.3  7.9  10.1   Hemoglobin 12.0 - 15.0 g/dL 16.1  09.6  04.5   Hematocrit 36.0 - 46.0 % 36.6  38.2  37.6   Platelets 150 - 400 K/uL 227  309  209     Assessment & Plan: LEMA STIDHAM is a 73 y.o. woman with Stage IIIC SCC of the vulva who presents for follow-up.   Patient is overall doing well, healing well with exam findings most consistent with mild edema post-op. She is advised to continue use of sitz baths and keeping the area clean/dry.    Patient has met with medical oncology and radiation  oncology. Plan for port placement on Monday.  Plan is for her to start treatment after her grandsons graduation in mid May.  Plan will be for concurrent cisplatin with radiation as well as addition of immunotherapy given advanced disease and PD-L1 CPS of 85%.   If there are any concerns about her continued healing after surgery before starting treatment, we are happy to see her back. Instead of scheduling a follow up appointment with our office, the patient will plan to call for any needs, change in symptoms etc to be seen. Reportable signs and symptoms reviewed. Follow up will also be arranged after the completion of treatment if not seen sooner.  20 minutes of total time was spent for this patient encounter, including preparation, face-to-face counseling with the patient and coordination of care, and documentation of the encounter.  Vira Grieves NP [facility] GYN Oncology

## 2023-07-31 ENCOUNTER — Ambulatory Visit (HOSPITAL_COMMUNITY)
Admission: RE | Admit: 2023-07-31 | Discharge: 2023-07-31 | Disposition: A | Discharge status: Home / Self Care | Source: Ambulatory Visit | Attending: Oncology | Admitting: Oncology

## 2023-07-31 ENCOUNTER — Other Ambulatory Visit: Payer: Self-pay

## 2023-07-31 ENCOUNTER — Encounter (HOSPITAL_COMMUNITY): Payer: Self-pay

## 2023-07-31 DIAGNOSIS — C519 Malignant neoplasm of vulva, unspecified: Secondary | ICD-10-CM | POA: Diagnosis not present

## 2023-07-31 DIAGNOSIS — Z79899 Other long term (current) drug therapy: Secondary | ICD-10-CM | POA: Insufficient documentation

## 2023-07-31 DIAGNOSIS — M797 Fibromyalgia: Secondary | ICD-10-CM | POA: Diagnosis not present

## 2023-07-31 DIAGNOSIS — F32A Depression, unspecified: Secondary | ICD-10-CM | POA: Insufficient documentation

## 2023-07-31 DIAGNOSIS — G4730 Sleep apnea, unspecified: Secondary | ICD-10-CM | POA: Insufficient documentation

## 2023-07-31 DIAGNOSIS — K589 Irritable bowel syndrome without diarrhea: Secondary | ICD-10-CM | POA: Insufficient documentation

## 2023-07-31 DIAGNOSIS — Z853 Personal history of malignant neoplasm of breast: Secondary | ICD-10-CM | POA: Diagnosis not present

## 2023-07-31 DIAGNOSIS — E785 Hyperlipidemia, unspecified: Secondary | ICD-10-CM | POA: Diagnosis not present

## 2023-07-31 DIAGNOSIS — F419 Anxiety disorder, unspecified: Secondary | ICD-10-CM | POA: Diagnosis not present

## 2023-07-31 DIAGNOSIS — I10 Essential (primary) hypertension: Secondary | ICD-10-CM | POA: Diagnosis not present

## 2023-07-31 DIAGNOSIS — K219 Gastro-esophageal reflux disease without esophagitis: Secondary | ICD-10-CM | POA: Diagnosis not present

## 2023-07-31 HISTORY — PX: IR IMAGING GUIDED PORT INSERTION: IMG5740

## 2023-07-31 MED ORDER — FENTANYL CITRATE (PF) 100 MCG/2ML IJ SOLN
INTRAMUSCULAR | Status: AC
Start: 2023-07-31 — End: ?
  Filled 2023-07-31: qty 2

## 2023-07-31 MED ORDER — LIDOCAINE HCL 1 % IJ SOLN
20.0000 mL | Freq: Once | INTRAMUSCULAR | Status: AC
  Administered 2023-07-31: 15 mL via INTRADERMAL

## 2023-07-31 MED ORDER — FENTANYL CITRATE (PF) 100 MCG/2ML IJ SOLN
INTRAMUSCULAR | Status: AC | PRN
  Administered 2023-07-31 (×2): 50 ug via INTRAVENOUS

## 2023-07-31 MED ORDER — LIDOCAINE HCL 1 % IJ SOLN
INTRAMUSCULAR | Status: AC
  Filled 2023-07-31: qty 20

## 2023-07-31 MED ORDER — MIDAZOLAM HCL 2 MG/2ML IJ SOLN
INTRAMUSCULAR | Status: AC
  Filled 2023-07-31: qty 2

## 2023-07-31 MED ORDER — HEPARIN SOD (PORK) LOCK FLUSH 100 UNIT/ML IV SOLN
500.0000 [IU] | Freq: Once | INTRAVENOUS | Status: AC
Start: 2023-07-31 — End: 2023-07-31
  Administered 2023-07-31: 500 [IU]

## 2023-07-31 MED ORDER — MIDAZOLAM HCL 2 MG/2ML IJ SOLN
INTRAMUSCULAR | Status: AC | PRN
  Administered 2023-07-31 (×2): 1 mg via INTRAVENOUS

## 2023-07-31 MED ORDER — SODIUM CHLORIDE 0.9 % IV SOLN: INTRAVENOUS | Status: DC

## 2023-07-31 MED ORDER — HEPARIN SOD (PORK) LOCK FLUSH 100 UNIT/ML IV SOLN
INTRAVENOUS | Status: AC
Start: 2023-07-31 — End: ?
  Filled 2023-07-31: qty 5

## 2023-07-31 NOTE — Discharge Instructions (Signed)

## 2023-07-31 NOTE — Procedures (Signed)
 Interventional Radiology Procedure Note  Procedure: Single Lumen Power Port Placement    Access:  Right IJ vein.  Findings: Catheter tip positioned at SVC/RA junction. Port is ready for immediate use.   Complications: None  EBL: < 10 mL  Recommendations:  - Ok to shower in 24 hours - Do not submerge for 7 days - Routine line care   Maxi Carreras T. Fredia Sorrow, M.D Pager:  [phone]

## 2023-08-01 DIAGNOSIS — C519 Malignant neoplasm of vulva, unspecified: Secondary | ICD-10-CM | POA: Diagnosis not present

## 2023-08-02 ENCOUNTER — Telehealth: Payer: Self-pay | Admitting: Oncology

## 2023-08-02 NOTE — Telephone Encounter (Signed)
 Doris Reyes called and had her port placed on Monday at [facility].  She took the dressing off this morning and is now having itching around the site where the dressing was - not on the incision area.  She said it is not red.  She is wondering if she can put triamcinolone  cream on the area that itches.  Advised her the itching may be from the dressing and to try applying some benadryl  cream or hydrocortisone cream but not on the incision area.  Advised I will call and check on her tomorrow to see if it is better.

## 2023-08-02 NOTE — Telephone Encounter (Signed)
 Patient has been scheduled. Aware of appt date and time.

## 2023-08-02 NOTE — Telephone Encounter (Signed)
 Contacted pt to schedule an appt for labs/ Chemo Ed some time next week. Unable to reach via phone, voicemail was left.

## 2023-08-03 NOTE — Telephone Encounter (Signed)
 Called Doris Reyes and the itching is much better today.  She used hydrocortisone cream around the site where it was itching which really helped.

## 2023-08-04 NOTE — Progress Notes (Signed)
 [facility] CARE CLINIC CONSULT NOTE [facility] [place] [phone]   [phone]   PATIENT CARE TEAM: Patient Care Team: Roselind Congo, MD as PCP - General (Family Medicine) Tami Falcon, MD as Consulting Physician (Gastroenterology) Saddie Crane, RN as Registered Nurse Verlie Glisson, MD as Consulting Physician (Radiation Oncology) [name], MD as Consulting Physician (Oncology)   Name of the patient: Doris Reyes  [id number]  [birth year]   Date of visit: 08/08/23  Diagnosis: Vulvar cancer  Chief complaint/Reason for visit- Initial Meeting for [facility], preparing for starting chemotherapy  Heme/Onc history:  Oncology History  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)  06/08/2021 Initial Diagnosis   Breast cancer in female [facility])   07/23/2021 Genetic Testing   Negative hereditary cancer genetic testing: no pathogenic variants detected in [facility] BRCAPlus Panel. Report date is July 23, 2021.  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  The report date is Jul 26, 2021.  The BRCAplus panel offered by [employer] and includes sequencing and deletion/duplication analysis for the following 8 genes: ATM, BRCA1, BRCA2, CDH1, CHEK2, PALB2, PTEN, and TP53.  Results of pan-cancer panel pending.   The CancerNext-Expanded gene panel offered by [facility] and includes sequencing and rearrangement analysis for the following 77 genes: AIP, ALK, APC*, ATM*, AXIN2, BAP1, BARD1, BLM, BMPR1A, BRCA1*, BRCA2*, BRIP1*, CDC73, CDH1*, CDK4, CDKN1B, CDKN2A, CHEK2*, CTNNA1, DICER1, FANCC, FH, FLCN, GALNT12, KIF1B, LZTR1, MAX, MEN1, MET, MLH1*, MSH2*, MSH3, MSH6*, MUTYH*, NBN, NF1*, NF2, NTHL1, PALB2*, PHOX2B, PMS2*, POT1, PRKAR1A, PTCH1, PTEN*, RAD51C*, RAD51D*, RB1, RECQL, RET, SDHA, SDHAF2, SDHB, SDHC, SDHD,  SMAD4, SMARCA4, SMARCB1, SMARCE1, STK11, SUFU, TMEM127, TP53*, TSC1, TSC2, VHL and XRCC2 (sequencing and deletion/duplication); EGFR, EGLN1, HOXB13, KIT, MITF, PDGFRA, POLD1, and POLE (sequencing only); EPCAM and GREM1 (deletion/duplication only). DNA and RNA analyses performed for * genes.    08/23/2021 Cancer Staging   Staging form: Breast, AJCC 8th Edition - Pathologic stage from 08/23/2021: Stage IA (pT2(2), pN0(sn), cM0, G1, ER+, PR+, HER2-) - Signed by [name], MD on 09/29/2021 Histopathologic type: Lobular carcinoma, NOS Stage prefix: Initial diagnosis Method of lymph node assessment: Sentinel lymph node biopsy Nuclear grade: G1 Multigene prognostic tests performed: EndoPredict Histologic grading system: 3 grade system Residual tumor (R): R0 - None Laterality: Right Tumor size (mm): 24 Multiple tumors: Yes Number of tumors: 2 Lymph-vascular invasion (LVI): LVI not present (absent)/not identified Diagnostic confirmation: Positive histology PLUS positive immunophenotyping and/or positive genetic studies Specimen type: Excision Staged by: Managing physician Menopausal status: Postmenopausal Ki-67 (%): 5 Stage used in treatment planning: Yes National guidelines used in treatment planning: Yes Type of national guideline used in treatment planning: NCCN   Vulvar cancer (HCC)  06/28/2023 Initial Diagnosis   Vulvar cancer (HCC)   06/28/2023 Cancer Staging   Staging form: Vulva, AJCC V9 - Clinical stage from 06/28/2023: FIGO Stage IIIC (cT1b, cN1c, cM0) - Signed by [name], MD on 07/31/2023 Histopathologic type: Squamous cell carcinoma, NOS Stage prefix: Initial diagnosis Method of lymph node assessment: Lymph node dissection Histologic grade (G): G2 Histologic grading system: 3 grade system Tumor size (mm): 30 Lymph-vascular invasion (LVI): LVI present/identified, NOS Diagnostic confirmation: Positive histology Specimen type: Excision Staged by: Managing  physician Femoral-inguinal nodal status: Positive Solitary (s) or multifocal (m) tumors in the primary site: Solitary Perineural invasion (PNI): Unknown Stage used in treatment planning: Yes  National guidelines used in treatment planning: Yes Type of national guideline used in treatment planning: NCCN   08/21/2023 -  Chemotherapy   Patient is on Treatment Plan : CERVICAL Cisplatin (40) q7d + Pembrolizumab q21d + XRT     10/02/2023 -  Chemotherapy   Patient is on Treatment Plan : CERVICAL Pembrolizumab (200) q21d       Interval history-  The patient presents to chemo care clinic today for initial meeting in preparation for starting chemotherapy. I introduced the chemo care clinic and we discussed that the role of the clinic is to assist those who are at an increased risk of emergency room visits and/or complications during the course of chemotherapy treatment. We discussed that the increased risk takes into account factors such as age, performance status, and co-morbidities. We also discussed that for some, this might include barriers to care such as not having a primary care provider, lack of insurance/transportation, or not being able to afford medications. We discussed that the goal of the program is to help prevent unplanned ER visits and help reduce complications during chemotherapy. We do this by discussing specific risk factors to each individual and identifying ways that we can help improve these risk factors and reduce barriers to care.   She feels her vulvar incision is well healed but she still has occasional clear drainage and slight bleeding from the area.  She is concerned that there is redness around her left inguinal incisions.  She denies fevers or chills.  She also asks about enlargement of her heart seen on PET scan.  She has previously seen Dr. Dean Every and had an echocardiogram in 2018.  She does not have any cardiac symptoms at this time.   Physical examination- Deep erythema  along her left inguinal incisions.  There is no overlying erythema, warmth or tenderness.  Allergies  Allergen Reactions   Ceftin [Cefuroxime Axetil] Diarrhea   Cefuroxime Other (See Comments)    IBS   Other Other (See Comments)    Pneumonia vaccine (uncoded)   Sulfa  Antibiotics Nausea And Vomiting   Tramadol Hcl Nausea And Vomiting    Past Medical History:  Diagnosis Date   Anemia    Anxiety    Cancer (HCC)    right breast ILC   Complication of anesthesia    difficulty waking up   Depression    Diverticulitis    Family history of breast cancer    Family history of ovarian cancer    Family history of prostate cancer    Fibromyalgia    GERD (gastroesophageal reflux disease)    Heart murmur    History of colonic polyps    History of hiatal hernia    Hyperlipidemia    Hypertension    IBS (irritable bowel syndrome)    Ischemic colitis (HCC) 03/2012   Osteoporosis    Pre-diabetes    RLS (restless legs syndrome)    Sinusitis    Sleep apnea    CPAP nightly    Past Surgical History:  Procedure Laterality Date   BREAST LUMPECTOMY WITH RADIOACTIVE SEED LOCALIZATION Right 08/04/2021   Procedure: RIGHT BREAST LUMPECTOMY WITH RADIOACTIVE SEED LOCALIZATION;  Surgeon: Sim Dryer, MD;  Location: [facility];  Service: General;  Laterality: Right;   CHOLECYSTECTOMY  2004   COLONOSCOPY WITH PROPOFOL  N/A 04/18/2017   Procedure: COLONOSCOPY WITH PROPOFOL ;  Surgeon: Tami Falcon, MD;  Location: [facility];  Service: Endoscopy;  Laterality: N/A;   ESOPHAGOGASTRODUODENOSCOPY (EGD) WITH PROPOFOL  N/A 04/18/2017  Procedure: ESOPHAGOGASTRODUODENOSCOPY (EGD) WITH PROPOFOL ;  Surgeon: Tami Falcon, MD;  Location: [facility];  Service: Endoscopy;  Laterality: N/A;   EXAM UNDER ANESTHESIA, PELVIC N/A 06/28/2023   Procedure: EXAM UNDER ANESTHESIA, PELVIC;  Surgeon: Suzi Essex, MD;  Location: [facility];  Service: Gynecology;  Laterality: N/A;   FLEXIBLE SIGMOIDOSCOPY  04/20/2012    Procedure: FLEXIBLE SIGMOIDOSCOPY;  Surgeon: Almeda Aris, MD;  Location: [facility];  Service: Endoscopy;  Laterality: N/A;   INGUINAL LYMPHADENECTOMY N/A 06/28/2023   Procedure: LYMPHADENECTOMY, INGUINAL, OPEN;  Surgeon: Suzi Essex, MD;  Location: [facility];  Service: Gynecology;  Laterality: N/A;   IR IMAGING GUIDED PORT INSERTION  07/31/2023   NISSEN FUNDOPLICATION  2011   PARAESOPHAGEAL HERNIA REPAIR  09/11/2009   and Nissen fundoplication   RADICAL VULVECTOMY N/A 06/28/2023   Procedure: VULVECTOMY, RADICAL;  Surgeon: Suzi Essex, MD;  Location: [facility];  Service: Gynecology;  Laterality: N/A;  Possible skin flap   RE-EXCISION OF BREAST LUMPECTOMY Right 08/18/2021   Procedure: RE-EXCISION RIGHT BREAST LUMPECTOMY;  Surgeon: Sim Dryer, MD;  Location: [facility];  Service: General;  Laterality: Right;   SENTINEL NODE BIOPSY N/A 08/04/2021   Procedure: SENTINEL NODE BIOPSY;  Surgeon: Sim Dryer, MD;  Location: [facility];  Service: General;  Laterality: N/A;   TONSILLECTOMY  1960's   TOTAL ABDOMINAL HYSTERECTOMY  2001   w/ BSO    Social History   Socioeconomic History   Marital status: Widowed    Spouse name: Royston Cornea   Number of children: 1   Years of education: Not on file   Highest education level: Some college, no degree  Occupational History   Occupation: disabled  Tobacco Use   Smoking status: Never   Smokeless tobacco: Never   Tobacco comments:    brief exposure through her husband  Vaping Use   Vaping status: Never Used  Substance and Sexual Activity   Alcohol use: Never    Alcohol/week: 0.0 standard drinks of alcohol   Drug use: Never   Sexual activity: Not Currently  Other Topics Concern   Not on file  Social History Narrative   From [place] originally. Previously lived in [place]. Previously worked at [employer] also in a [employer]. Has also worked as a Diplomatic Services operational officer for [employer]. No pets currently. No bird exposure. No known  mold in her current home.    Lives with husband   Caffeine- coffee 2 c daily   Social Drivers of Health   Financial Resource Strain: Low Risk  (02/01/2022)   Overall Financial Resource Strain (CARDIA)    Difficulty of Paying Living Expenses: Not hard at all  Food Insecurity: No Food Insecurity (06/28/2023)   Hunger Vital Sign    Worried About Running Out of Food in the Last Year: Never true    Ran Out of Food in the Last Year: Never true  Transportation Needs: No Transportation Needs (06/28/2023)   PRAPARE - Administrator, Civil Service (Medical): No    Lack of Transportation (Non-Medical): No  Physical Activity: Not on file  Stress: Not on file  Social Connections: Moderately Isolated (06/28/2023)   Social Connection and Isolation Panel [NHANES]    Frequency of Communication with Friends and Family: More than three times a week    Frequency of Social Gatherings with Friends and Family: Twice a week    Attends Religious Services: More than 4 times per year    Active Member of Clubs or  Organizations: No    Attends Banker Meetings: Never    Marital Status: Widowed  Intimate Partner Violence: Not At Risk (06/28/2023)   Humiliation, Afraid, Rape, and Kick questionnaire    Fear of Current or Ex-Partner: No    Emotionally Abused: No    Physically Abused: No    Sexually Abused: No    Family History  Problem Relation Age of Onset   Colon cancer Mother 77   Alzheimer's disease Mother    Heart attack Father    Cancer Maternal Aunt        x2   Alzheimer's disease Maternal Aunt    Ovarian cancer Maternal Aunt    Prostate cancer Maternal Uncle    Kidney disease Maternal Uncle    Atrial fibrillation Maternal Uncle    Stroke Paternal Uncle    Aneurysm Paternal Grandmother        brain   Stroke Paternal Grandfather    Prostate cancer Cousin        paternal first cousin   Prostate cancer Cousin        paternal first cousin   Esophageal cancer Cousin         maternal first cousin   Breast cancer Cousin        DCIS, maternal first cousin   Ovarian cancer Cousin        maternal first cousin   Rheum arthritis Other    Lung disease Neg Hx      Current Outpatient Medications:    acetaminophen  (TYLENOL ) 650 MG CR tablet, 1,300 mg in the morning and at bedtime., Disp: , Rfl:    ALPRAZolam  (XANAX ) 0.25 MG tablet, Take 0.25 mg by mouth at bedtime as needed for anxiety., Disp: , Rfl:    amLODipine  (NORVASC ) 5 MG tablet, Take 5 mg by mouth daily., Disp: , Rfl:    Ascorbic Acid (VITAMIN C) 500 MG CHEW, Chew 500 mg by mouth daily., Disp: , Rfl:    aspirin EC 81 MG tablet, Take 81 mg by mouth at bedtime. Swallow whole., Disp: , Rfl:    atenolol  (TENORMIN ) 50 MG tablet, Take 75 mg by mouth daily. , Disp: , Rfl:    benazepril  (LOTENSIN ) 10 MG tablet, Take 10 mg by mouth daily., Disp: , Rfl:    benzonatate (TESSALON) 100 MG capsule, Take 100-200 mg by mouth at bedtime., Disp: , Rfl:    Biotin 1000 MCG tablet, Take 1,000 mcg by mouth daily., Disp: , Rfl:    Calcium  Citrate-Vitamin D (CITRACAL + D PO), Take 1 tablet by mouth daily., Disp: , Rfl:    cetirizine (ZYRTEC) 10 MG tablet, Take 10 mg by mouth at bedtime., Disp: , Rfl:    Cholecalciferol (VITAMIN D) 50 MCG (2000 UT) tablet, Take 2,000 Units by mouth daily., Disp: , Rfl:    cyanocobalamin 1000 MCG tablet, Take 1,000 mcg by mouth daily., Disp: , Rfl:    diclofenac sodium (VOLTAREN) 1 % GEL, Apply 1 application  topically daily as needed (for pain)., Disp: , Rfl:    diphenhydrAMINE  (BENADRYL ) 25 MG tablet, Take 25 mg by mouth every 6 (six) hours as needed for allergies., Disp: , Rfl:    diphenoxylate-atropine (LOMOTIL) 2.5-0.025 MG tablet, Take 2 tablets by mouth 4 (four) times daily as needed for diarrhea or loose stools., Disp: , Rfl:    DULoxetine  (CYMBALTA ) 60 MG capsule, Take 120 mg by mouth at bedtime., Disp: , Rfl:    EPINEPHrine  (EPIPEN  2-PAK) 0.3 mg/0.3 mL IJ  SOAJ injection, Inject 0.3 mg into the  muscle as needed for anaphylaxis., Disp: , Rfl:    estradiol  (ESTRACE  VAGINAL) 0.1 MG/GM vaginal cream, Place 1 Applicatorful vaginally 3 (three) times a week., Disp: 42.5 g, Rfl: 12   famotidine  (PEPCID ) 20 MG tablet, Take 20 mg by mouth at bedtime., Disp: , Rfl:    fexofenadine (ALLEGRA) 180 MG tablet, Take 180 mg by mouth in the morning., Disp: , Rfl:    fluticasone  (FLONASE ) 50 MCG/ACT nasal spray, Place 1 spray into both nostrils daily., Disp: , Rfl:    lidocaine  (XYLOCAINE ) 5 % ointment, Apply 1 Application topically 2 (two) times daily as needed for moderate pain (pain score 4-6) (to the vulva). Apply to vulva as needed for discomfort, Disp: 35.44 g, Rfl: 3   lidocaine -prilocaine  (EMLA ) cream, Apply to affected area once, Disp: 30 g, Rfl: 3   NON FORMULARY, Pt uses a c-pap nightly, Disp: , Rfl:    Nutritional Supplements (JUICE PLUS FIBRE PO), Take 2 each by mouth daily. Fruits and Vegetables, Disp: , Rfl:    Omega-3 Fatty Acids (FISH OIL) 1000 MG CAPS, Take 1,000 mg by mouth daily. , Disp: , Rfl:    ondansetron  (ZOFRAN ) 4 MG tablet, Take 4 mg by mouth every 8 (eight) hours as needed for nausea or vomiting., Disp: , Rfl:    ondansetron  (ZOFRAN ) 8 MG tablet, Take 1 tablet (8 mg total) by mouth every 8hrs as needed for nausea or vomiting. Start on the third day after cisplatin., Disp: 30 tablet, Rfl: 1   pantoprazole  (PROTONIX ) 40 MG tablet, Take 40 mg by mouth every evening., Disp: , Rfl:    Phenylephrine -APAP-Guaifenesin (TYLENOL  SINUS SEVERE PO), Take 2 tablets by mouth daily as needed (drainage)., Disp: , Rfl:    polyvinyl alcohol (LIQUIFILM TEARS) 1.4 % ophthalmic solution, Place 1 drop into both eyes 5 (five) times daily., Disp: , Rfl:    Probiotic Product (PROBIOTIC & ACIDOPHILUS EX ST PO), Take 1 capsule by mouth daily. Ultra Flora Plus Capsules, Disp: , Rfl:    prochlorperazine  (COMPAZINE ) 10 MG tablet, Take 1 tablet (10 mg total) by mouth every 6 (six) hours as needed for nausea or  vomiting., Disp: 30 tablet, Rfl: 1   Propylene Glycol (SYSTANE COMPLETE) 0.6 % SOLN, Place 1 drop into both eyes in the morning and at bedtime., Disp: , Rfl:    rOPINIRole  (REQUIP ) 0.5 MG tablet, Take 0.5 mg by mouth at bedtime., Disp: , Rfl:    senna (SENOKOT) 8.6 MG tablet, 2 tablets at bedtime as needed Orally Once a day, Disp: , Rfl:    senna-docusate (SENOKOT-S) 8.6-50 MG tablet, Take 2 tablets by mouth at bedtime. For AFTER surgery, do not take if having diarrhea (Patient taking differently: Take 1 tablet by mouth at bedtime.), Disp: 30 tablet, Rfl: 0   simvastatin  (ZOCOR ) 10 MG tablet, Take 10 mg by mouth at bedtime.  , Disp: , Rfl:    sucralfate  (CARAFATE ) 1 GM/10ML suspension, Take 1 g by mouth 2 (two) times daily., Disp: , Rfl:    traZODone  (DESYREL ) 50 MG tablet, Take 50 mg by mouth at bedtime., Disp: , Rfl:    triamcinolone  (KENALOG ) 0.025 % ointment, Apply 1 Application topically 2 (two) times daily., Disp: 30 g, Rfl: 0   vitamin E 400 UNIT capsule, Take 400 Units by mouth daily.  , Disp: , Rfl:    White Petrolatum -Mineral Oil (REFRESH P.M. OP), Place 1 Application into both eyes at bedtime., Disp: , Rfl:  Latest Ref Rng & Units 07/20/2023   11:01 AM  CMP  Glucose 70 - 99 mg/dL 161   BUN 8 - 23 mg/dL 16   Creatinine 0.96 - 1.00 mg/dL 0.45   Sodium 409 - 811 mmol/L 136   Potassium 3.5 - 5.1 mmol/L 4.7   Chloride 98 - 111 mmol/L 103   CO2 22 - 32 mmol/L 24   Calcium  8.9 - 10.3 mg/dL 9.7   Total Protein 6.5 - 8.1 g/dL 6.3   Total Bilirubin 0.0 - 1.2 mg/dL 0.2   Alkaline Phos 38 - 126 U/L 86   AST 15 - 41 U/L 16   ALT 0 - 44 U/L 13       Latest Ref Rng & Units 07/20/2023   11:01 AM  CBC  WBC 4.0 - 10.5 K/uL 4.3   Hemoglobin 12.0 - 15.0 g/dL 91.4   Hematocrit 78.2 - 46.0 % 36.6   Platelets 150 - 400 K/uL 227     No images are attached to the encounter.  IR IMAGING GUIDED PORT INSERTION Result Date: 07/31/2023 CLINICAL DATA:  Vulvar carcinoma and need for porta cath  for chemotherapy. EXAM: IMPLANTED PORT A CATH PLACEMENT WITH ULTRASOUND AND FLUOROSCOPIC GUIDANCE ANESTHESIA/SEDATION: Moderate (conscious) sedation was employed during this procedure. A total of Versed  2.0 mg and Fentanyl  100 mcg was administered intravenously. Moderate Sedation Time: 34 minutes. The patient's level of consciousness and vital signs were monitored continuously by radiology nursing throughout the procedure under my direct supervision. FLUOROSCOPY: 36 seconds.  6.0 mGy. PROCEDURE: The procedure, risks, benefits, and alternatives were explained to the patient. Questions regarding the procedure were encouraged and answered. The patient understands and consents to the procedure. A time-out was performed prior to initiating the procedure. Ultrasound was utilized to confirm patency of the right internal jugular vein. An ultrasound image was saved in recorder. The right neck and chest were prepped with chlorhexidine  in a sterile fashion, and a sterile drape was applied covering the operative field. Maximum barrier sterile technique with sterile gowns and gloves were used for the procedure. Local anesthesia was provided with 1% lidocaine . After creating a small venotomy incision, a 21 gauge needle was advanced into the right internal jugular vein under direct, real-time ultrasound guidance. Ultrasound image documentation was performed. After securing guidewire access, an 8 Fr dilator was placed. A J-wire was kinked to measure appropriate catheter length. A subcutaneous port pocket was then created along the upper chest wall utilizing sharp and blunt dissection. Portable cautery was utilized. The pocket was irrigated with sterile saline. A single lumen power injectable port was chosen for placement. The 8 Fr catheter was tunneled from the port pocket site to the venotomy incision. The port was placed in the pocket. External catheter was trimmed to appropriate length based on guidewire measurement. At the  venotomy, an 8 Fr peel-away sheath was placed over a guidewire. The catheter was then placed through the sheath and the sheath removed. Final catheter positioning was confirmed and documented with a fluoroscopic spot image. The port was accessed with a needle and aspirated and flushed with heparinized saline. The access needle was removed. The venotomy and port pocket incisions were closed with subcutaneous 3-0 Monocryl and subcuticular 4-0 Vicryl. Dermabond was applied to both incisions. COMPLICATIONS: COMPLICATIONS None FINDINGS: After catheter placement, the tip lies at the cavo-atrial junction. The catheter aspirates normally and is ready for immediate use. IMPRESSION: Placement of single lumen port a cath via right internal jugular vein. The  catheter tip lies at the cavo-atrial junction. A power injectable port a cath was placed and is ready for immediate use. Electronically Signed   By: Erica Hau M.D.   On: 07/31/2023 10:57   DG Bone Density Result Date: 07/17/2023 EXAM: DUAL X-RAY ABSORPTIOMETRY (DXA) FOR BONE MINERAL DENSITY 07/17/2023 11:48 am CLINICAL DATA:  73 year old Female Postmenopausal. Screening for osteoporosis Patient is or has been on glucocorticoid therapy. Patient is or has been on bone building therapies. TECHNIQUE: An axial (e.g., hips, spine) and/or appendicular (e.g., radius) exam was performed, as appropriate, using GE Secretary/administrator at [employer]. Images are obtained for bone mineral density measurement and are not obtained for diagnostic purposes. [id number] Exclusions: L1 excluded due to degenerative changes. COMPARISON:  None. New baseline. FINDINGS: Scan quality: Good. LUMBAR SPINE (L2-L4): BMD (in g/cm2): 0.912 T-score: -2.4 Z-score: -0.7 LEFT FEMORAL NECK: BMD (in g/cm2): 0.793 T-score: -1.8 Z-score: 0.0 LEFT TOTAL HIP: BMD (in g/cm2): 0.922 T-score: -0.7 Z-score: 0.9 RIGHT FEMORAL NECK: BMD (in g/cm2): 0.855 T-score: -1.3 Z-score: 0.5 RIGHT TOTAL  HIP: BMD (in g/cm2): 0.896 T-score: -0.9 Z-score: 0.7 FRAX 10-YEAR PROBABILITY OF FRACTURE: 10-year fracture risk is performed using the [employer] FRAX calculator based on patient-reported risk factors. Major osteoporotic fracture: 10.5% Hip fracture: 2.0% Other situations known to alter the reliability of the FRAX score should be considered when making treatment decisions, including chronic glucocorticoid use and past treatments. Further guidance on treatment can be found at the [facility] Osteoporosis Foundation's website [email or web address]://www.patton.com/. IMPRESSION: Osteopenia based on BMD. Fracture risk is unknown due to history of bone building therapy. RECOMMENDATIONS: 1. All patients should optimize calcium  and vitamin D intake. 2. Consider FDA-approved medical therapies in postmenopausal women and men aged 32 years and older, based on the following: - A hip or vertebral (clinical or morphometric) fracture - T-score less than or equal to -2.5 and secondary causes have been excluded. - Low bone mass (T-score between -1.0 and -2.5) and a 10-year probability of a hip fracture greater than or equal to 3% or a 10-year probability of a major osteoporosis-related fracture greater than or equal to 20% based on the [place] -adapted WHO algorithm. - Clinician judgment and/or patient preferences may indicate treatment for people with 10-year fracture probabilities above or below these levels 3. Patients with diagnosis of osteoporosis or at high risk for fracture should have regular bone mineral density tests. For patients eligible for Medicare, routine testing is allowed once every 2 years. The testing frequency can be increased to one year for patients who have rapidly progressing disease, those who are receiving or discontinuing medical therapy to restore bone mass, or have additional risk factors. Electronically Signed   By: Sundra Engel M.D.   On: 07/17/2023 13:39     Assessment and plan-  The patient is a 73 y.o. female who  presents to Chemo Care Clinic for initial meeting in preparation for starting chemotherapy for the treatment of  1. Vulvar cancer (HCC)    Her incisions are well-healed.  There is no evidence of infection.   Chemo Care Clinic/High Risk for ER/Hospitalization during chemotherapy- We discussed the role of the chemo care clinic and identified patient specific risk factors. I discussed that patient was identified as high risk primarily based on:  Patient has past medical history positive for: Past Medical History:  Diagnosis Date   Anemia    Anxiety    Cancer (HCC)    right breast ILC   Complication  of anesthesia    difficulty waking up   Depression    Diverticulitis    Family history of breast cancer    Family history of ovarian cancer    Family history of prostate cancer    Fibromyalgia    GERD (gastroesophageal reflux disease)    Heart murmur    History of colonic polyps    History of hiatal hernia    Hyperlipidemia    Hypertension    IBS (irritable bowel syndrome)    Ischemic colitis (HCC) 03/2012   Osteoporosis    Pre-diabetes    RLS (restless legs syndrome)    Sinusitis    Sleep apnea    CPAP nightly    Patient has past surgical history positive for: Past Surgical History:  Procedure Laterality Date   BREAST LUMPECTOMY WITH RADIOACTIVE SEED LOCALIZATION Right 08/04/2021   Procedure: RIGHT BREAST LUMPECTOMY WITH RADIOACTIVE SEED LOCALIZATION;  Surgeon: Sim Dryer, MD;  Location: [facility];  Service: General;  Laterality: Right;   CHOLECYSTECTOMY  2004   COLONOSCOPY WITH PROPOFOL  N/A 04/18/2017   Procedure: COLONOSCOPY WITH PROPOFOL ;  Surgeon: Tami Falcon, MD;  Location: [facility];  Service: Endoscopy;  Laterality: N/A;   ESOPHAGOGASTRODUODENOSCOPY (EGD) WITH PROPOFOL  N/A 04/18/2017   Procedure: ESOPHAGOGASTRODUODENOSCOPY (EGD) WITH PROPOFOL ;  Surgeon: Tami Falcon, MD;  Location: [facility];  Service: Endoscopy;  Laterality: N/A;   EXAM UNDER ANESTHESIA,  PELVIC N/A 06/28/2023   Procedure: EXAM UNDER ANESTHESIA, PELVIC;  Surgeon: Suzi Essex, MD;  Location: [facility];  Service: Gynecology;  Laterality: N/A;   FLEXIBLE SIGMOIDOSCOPY  04/20/2012   Procedure: FLEXIBLE SIGMOIDOSCOPY;  Surgeon: Almeda Aris, MD;  Location: [facility];  Service: Endoscopy;  Laterality: N/A;   INGUINAL LYMPHADENECTOMY N/A 06/28/2023   Procedure: LYMPHADENECTOMY, INGUINAL, OPEN;  Surgeon: Suzi Essex, MD;  Location: [facility];  Service: Gynecology;  Laterality: N/A;   IR IMAGING GUIDED PORT INSERTION  07/31/2023   NISSEN FUNDOPLICATION  2011   PARAESOPHAGEAL HERNIA REPAIR  09/11/2009   and Nissen fundoplication   RADICAL VULVECTOMY N/A 06/28/2023   Procedure: VULVECTOMY, RADICAL;  Surgeon: Suzi Essex, MD;  Location: [facility];  Service: Gynecology;  Laterality: N/A;  Possible skin flap   RE-EXCISION OF BREAST LUMPECTOMY Right 08/18/2021   Procedure: RE-EXCISION RIGHT BREAST LUMPECTOMY;  Surgeon: Sim Dryer, MD;  Location: [facility];  Service: General;  Laterality: Right;   SENTINEL NODE BIOPSY N/A 08/04/2021   Procedure: SENTINEL NODE BIOPSY;  Surgeon: Sim Dryer, MD;  Location: [facility];  Service: General;  Laterality: N/A;   TONSILLECTOMY  1960's   TOTAL ABDOMINAL HYSTERECTOMY  2001   w/ BSO    Provided general information including the following:  1.  Date of education: 08/08/2023 2.  Physician name: Dr. Aurther Blue 3.  Diagnosis: Vulvar cancer 4.  Stage: IIIC 5.  Cure  6.  Chemotherapy plan including drugs and how often: Weekly cisplatin and pembrolizumab every 3 weeks with concurrent radiation 7.  Start date: 08/14/1923 8.  Other referrals: None at this time 9.  The patient is to call our office with any questions or concerns.  Our office number [phone], if after hours or on the weekend, call the same number and wait for the answering service.  There is always an oncologist on call. 10.  Medications prescribed:  ondansetron , prochlorperazine  11.  The patient has verbalized understanding of the treatment plan and has no barriers to adherence or understanding.   Obtained signed consent from patient.  Discussed symptoms including:  1.  Low blood counts including white blood cells red blood cells, and platelets.  If experience increased fatigue or abnormal bruising or bleeding, call our office. 2.  Infection including to avoid large crowds, wash hands frequently, and stay away from people who were sick.  If fever develops of 100.4 or higher, call our office. 3.  Mucositis:  Instructions on mouth rinse given (baking soda and salt mixture).  Keep mouth clean.  Use soft bristle toothbrush.  Avoid alcohol containing mouthwash.  If mouth sores develop, call our office. 4.  Nausea/vomiting:  Prescriptions given: ondansetron  8 mg every 8 hours as needed for nausea or vomiting and prochlorperazine  10 mg every 6 hours as needed for nausea or vomiting, may alternate these medications and take around the clock if persistent.  If nausea and vomiting is not controlled, call our office 5.  Diarrhea: Use over-the-counter Imodium.  Call our office if diarrhea is not controlled. 6.  Constipation: Use senna-S, 1 to 2 tablets twice a day.  Call our office if no BM in 2 to 3 days. 7.  Loss of appetite:  Try to eat small meals every 2-3 hours.  Call our office if not able to eat or drink. 8.  Taste changes:  Try zinc 50 mg daily.  If becomes severe call clinic. 9.  Drink 2 to 3 quarts of water per day. Call our office if not able to drink enough for urine to be pale yellow. 10. Avoid alcoholic beverages. 11. Peripheral neuropathy: Call office if numbness or tingling in hands or feet worsens or is suddenly severe. 12.  Ringing in the ears or hearing loss.  Call our office if this develops.    The patient was given written information printed from [employer] patient education on individual chemotherapy agents which includes: Name  of medications Approved uses Dose and schedule Storage and handling Handling body fluids and waste Drug and food interactions Possible side effects and management Pregnancy, sexual activity, and contraception Obtaining medication   Gave information on the supportive care team and how to contact them regarding services.  Discussed advanced directives.  The patient does not have their advanced directives.   We discussed that social determinants of health may have significant impacts on health and outcomes for cancer patients.  Today we discussed specific social determinants of performance status, alcohol use, depression, financial needs, food insecurity, housing, interpersonal violence, social connections, stress, tobacco use, and transportation.    After lengthy discussion the following were identified as areas of need:   Outpatient services: We discussed options including home based and outpatient services, DME, nutrition counseling, and supportive care program. We discussed that patients who participate in regular physical activity report fewer negative impacts of cancer and treatments and report less fatigue.   Financial Concerns: We discussed that living with cancer can create tremendous financial burden.  We discussed options for assistance. I asked that if assistance is needed in affording medications or paying bills to please let us  know so that we can provide assistance. We discussed options for food including social services.  Referral to Social work:  Introduced services available, such as support with utility bill, cell phone and gas vouchers.  Introduced [place], [place] who can provide individual counseling.    Support groups: We discussed options for support groups at [facility]. We discussed options for managing stress including healthy eating and exercise, as well as participating in no charge counseling services at the cancer center  and support groups.  If these are of  interest, patient can notify either myself or primary nursing team.We discussed options for management including medications.  Transportation: We discussed options for transportation.  The patient will contact our office if she requires assistance with transportation.  Palliative care services: We have palliative care services available in the cancer center to discuss goals of care and advanced care planning.  Please let us  know if you have any questions or would like to speak to our palliative care practitioner.  Symptom Management Clinic: We discussed our symptom management clinic which is available for acute concerns while receiving treatment such as nausea, vomiting or diarrhea.  We can be reached via telephone at [phone].  We are available for virtual or in person visits on the same day from 9 to 4 PM Monday through Friday.   She denies needing specific assistance at this time.  She is seeing our dietitian today for support during therapy.  She will be followed by Dr. Johny Nap clinical team.   Disposition: RTC on Aug 22, 2023  Visit Diagnosis 1. Vulvar cancer (HCC)     I discussed the assessment and treatment plan with the patient.  The patient was provided an opportunity to ask questions and all were answered. The patient expressed understanding and was in agreement with this plan. She also understands that she can call clinic at any time with any questions, concerns, or complaints.   I provided 40 minutes of face-to-face time during this encounter, and > 50% was spent counseling as documented under my assessment & plan.   Yosiah Jasmin A. Veverly Grace, PA-C [facility] [phone]

## 2023-08-08 ENCOUNTER — Other Ambulatory Visit: Payer: Self-pay | Admitting: Oncology

## 2023-08-08 ENCOUNTER — Inpatient Hospital Stay

## 2023-08-08 ENCOUNTER — Encounter: Payer: Self-pay | Admitting: Oncology

## 2023-08-08 ENCOUNTER — Inpatient Hospital Stay (HOSPITAL_BASED_OUTPATIENT_CLINIC_OR_DEPARTMENT_OTHER): Admitting: Hematology and Oncology

## 2023-08-08 ENCOUNTER — Encounter: Payer: Self-pay | Admitting: Hematology and Oncology

## 2023-08-08 ENCOUNTER — Inpatient Hospital Stay: Admitting: Dietician

## 2023-08-08 ENCOUNTER — Ambulatory Visit: Payer: Medicare Other | Admitting: Oncology

## 2023-08-08 ENCOUNTER — Other Ambulatory Visit: Payer: Self-pay | Admitting: Pharmacist

## 2023-08-08 VITALS — BP 120/87 | HR 55 | Temp 98.1°F | Resp 20 | Ht 63.0 in | Wt 212.5 lb

## 2023-08-08 DIAGNOSIS — C519 Malignant neoplasm of vulva, unspecified: Secondary | ICD-10-CM

## 2023-08-08 DIAGNOSIS — Z79899 Other long term (current) drug therapy: Secondary | ICD-10-CM | POA: Diagnosis not present

## 2023-08-08 DIAGNOSIS — Z853 Personal history of malignant neoplasm of breast: Secondary | ICD-10-CM | POA: Diagnosis not present

## 2023-08-08 DIAGNOSIS — Z90710 Acquired absence of both cervix and uterus: Secondary | ICD-10-CM | POA: Diagnosis not present

## 2023-08-08 DIAGNOSIS — Z90722 Acquired absence of ovaries, bilateral: Secondary | ICD-10-CM | POA: Diagnosis not present

## 2023-08-08 MED ORDER — ONDANSETRON HCL 8 MG PO TABS
ORAL_TABLET | ORAL | 1 refills | Status: AC
Start: 2023-08-08 — End: ?

## 2023-08-08 MED ORDER — LIDOCAINE-PRILOCAINE 2.5-2.5 % EX CREA: TOPICAL_CREAM | CUTANEOUS | 3 refills | Status: DC

## 2023-08-08 MED ORDER — PROCHLORPERAZINE MALEATE 10 MG PO TABS
10.0000 mg | ORAL_TABLET | Freq: Four times a day (QID) | ORAL | 1 refills | Status: DC | PRN
Start: 2023-08-08 — End: 2023-09-26

## 2023-08-08 NOTE — Progress Notes (Signed)
 08/08/23 NO LABS DRAWN PER KELLI MOSHER PAC.

## 2023-08-08 NOTE — Progress Notes (Signed)
 Nutrition Assessment: Reached out to patient at home telephone number.    Reason for Assessment: New Patient Assessment   ASSESSMENT: Patient is a 73 year old female with recently diagnosed vulvar cancer s/p pelvic vulvectomy, bilateral debulking, radical lymphadenectomy, and inguinal dissection done on 06/28/2023, PMHx that includes breast cancer, osteopenia, IBS, many allergic reactions to what she believe to be MSG. Treatment plan is for concurrent radiation which will include weekly cisplatin and immunotherapy with pembrolizumab. She will be followed by Drs. Almer Jacobson and [place].  Problematic foods have included Beef pot roast, from a restaurant, shrimp, fries at [employer].  She's had five very bad reactions since 2013 resulting in diarrhea, then swollen face, lips and throat closing.  Usual foods that work well with her IBS include: Breakfast: Blueberries and Activia yogurt, cheese stick and coffee (can also eat eggs if out with friends) Lunch: Sandwich or soup Dinner: Meat not every day, (mostly chicken) will eat fin fish (salmon) like starchy vegetables, beans, doesn't know about soy products or tofu. Likes pizza, pasta and sweets and sweet tea,  Used to cook from garden since spouse past doesn't cook much eats out with friends. Doesn't drink milk much (lactose intolerant), Fluids are water mostly, some sprite or Gingerale to settle stomach.  Doesn't eat much citrus or tomato juices due to GERD.  Nutrition Focused Physical Exam: unable to perform NFPE   Medications: Many vitamins and supplements all ordered by MDs. probiotic     Labs: 07/20/23 Hgb 11.8   Anthropometrics:   Height: 63" Weight: 212.5 UBW: 212-215# BMI: 37.64   Estimated Energy Needs  Kcals: 2800 Protein: 96-115 g Fluid: 3 L   NUTRITION DIAGNOSIS: Food and Nutrition Related Knowledge Deficit related to cancer and associated treatments as evidenced by no prior need for nutrition related information.     INTERVENTION:  Encouraged no more than 100mg  Vitamin C daily as supplement. Relayed that nutrition services are wrap around service provided at no charge and encouraged continued communication if experiencing any nutritional impact symptoms (NIS). Educated on importance of adequate nourishment with calorie and protein energy intake with nutrient dense foods when possible to maintain weight/strength and QOL.   Discussed strategies for anemia. Provided Nutrition Tip sheet for Anemia with Contact information   MONITORING, EVALUATION, GOAL: weight trends, nutrition impact symptoms, PO intake, labs   Next Visit: PRN at patient or provider request.  Carleen Chary, RDN, LDN Registered Dietitian, [facility] Part Time Remote (Usual office hours: Tuesday-Thursday) Mobile: [phone]

## 2023-08-09 ENCOUNTER — Encounter: Payer: Self-pay | Admitting: Oncology

## 2023-08-09 ENCOUNTER — Other Ambulatory Visit: Payer: Self-pay

## 2023-08-10 ENCOUNTER — Encounter: Payer: Self-pay | Admitting: Oncology

## 2023-08-14 ENCOUNTER — Other Ambulatory Visit

## 2023-08-14 ENCOUNTER — Ambulatory Visit

## 2023-08-14 ENCOUNTER — Encounter: Payer: Self-pay | Admitting: Oncology

## 2023-08-15 ENCOUNTER — Ambulatory Visit
Admission: RE | Admit: 2023-08-15 | Discharge: 2023-08-15 | Disposition: A | Discharge status: Home / Self Care | Source: Ambulatory Visit | Attending: Radiation Oncology | Admitting: Radiation Oncology

## 2023-08-15 ENCOUNTER — Encounter: Payer: Self-pay | Admitting: Oncology

## 2023-08-15 ENCOUNTER — Other Ambulatory Visit: Payer: Self-pay

## 2023-08-15 DIAGNOSIS — C519 Malignant neoplasm of vulva, unspecified: Secondary | ICD-10-CM | POA: Diagnosis not present

## 2023-08-15 DIAGNOSIS — C774 Secondary and unspecified malignant neoplasm of inguinal and lower limb lymph nodes: Secondary | ICD-10-CM | POA: Diagnosis not present

## 2023-08-15 DIAGNOSIS — Z853 Personal history of malignant neoplasm of breast: Secondary | ICD-10-CM | POA: Diagnosis not present

## 2023-08-15 DIAGNOSIS — Z510 Encounter for antineoplastic radiation therapy: Secondary | ICD-10-CM | POA: Insufficient documentation

## 2023-08-17 NOTE — Progress Notes (Signed)
 [facility]  [address] [place],  [place]  [address] [phone]  Clinic Day: 08/18/2023  Referring physician:  Elyn Han, MD   Assessment:  Invasive lobular carcinoma This was found on screening mammogram but the MRI reveals focal enhancement surrounding the biopsy clip up to 2.3 cm and there was non-mass enhancement posterior to this known malignancy on MRI.  This area was biopsied in order to guide her ultimate surgery.This is strongly ER/PR positive, HER2 negative and has a low Ki 67 of less than 5%.  She has now had a lumpectomy but had to go back for reexcision of the margins.  The sentinel lymph node was negative but she did have 2 primaries, measuring 2.4 cm and 2.2 cm, for a T2 N0 M0, stage IIA.  Even though she has several favorable characteristics, such as grade 1 histology and a low Ki 67, I still recommended that we pursue Endopredict testing to quantitate her risk for recurrence, and fortunately she has an EpClin score of 2.6, low risk.  This correlates with a 5.1% risk of distant recurrence in the next 10 years. Her benefit from chemotherapy would be 1.0% so we did not pursue. Her risk for a late recurrence is 4.0%. Unfortunately she has refused hormonal therapy.  Vulvar Carcinoma Stage IIIC She had a pelvic vulvectomy, bilateral debulking, radical lymphadenectomy, and inguinal dissection done on 06/28/2023, with 3/4 positive nodes on the left side, up to 3 cm and with extensive extranodal tumor. This was moderately differentiated squamous cell carcinoma measuring 3 cm with depth of invasion 5 mm and carcinoma in situ, for a T1b N2c M0. She had lymphovascular invasion and carcinoma in situ at the 6 o'clock margin with < 1 mm margin for invasive carcinoma. We will plan concurrent chemoradiation with cisplatin. She will also have immunotherapy with pembrolizuab since her PDL  1 score is 85%, and we will continue that as maintenance after the other is completed. PET scan revealed metastatic left inguinal lymph nodes up to 1.8 cm in diameter, but no additional evidence of metastatic disease in the neck, chest, abdomen or pelvis. There was a 4 mm right lower lobe nodule, too small for PET resolution.  We have recommended concurrent radiation, chemotherapy and immunotherapy but start of treatment has been delayed several times due to infection/cellulitis of the vulvar area.  Strong family history Including multiple females with breast cancer, her mother had colon cancer at age 67, and another maternal aunt had ovarian cancer.  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  She has been counseled on the implications of this and is already getting regular colonoscopy.  However the information will also be passed on to family members to pursue testing.  Osteopenia Her spine shows a T score of -2.6 for osteoporosis and the right femur has osteopenia.  At the very least she should be taking calcium  and vitamin D. Bone density scan done on 07/17/2023 revealed osteopenia.  Cellulitis of the Genitalia This has improved but now is much more red and inflamed consistent with cellulitis. We will therefore give her Augmentin  875mg  BID for 10 days and postpone the start of her treatment by 1 week.  She will be reevaluated next week by [facility].   Plan: PET scan revealed metastatic left inguinal lymph nodes up to 1.8 cm in diameter, but no additional evidence of metastatic disease in the neck, chest, abdomen or pelvis. There was a  4 mm right lower lobe nodule, too small for PET resolution.  Patient had a pelvic vulvectomy, bilateral debulking, radical lymphadenectomy, and inguinal dissection done on 06/28/2023.  Pathology revealed 3/4 positive nodes on the left side, up to 3 cm and with extensive extranodal tumor, and negative nodes on the right side.  The vulvar carcinoma measured 3 cm with depth of invasion 5 mm and carcinoma in situ, for a T1b N2c M0. She had lymphovascular invasion and carcinoma in situ at the 6 o'clock margin with < 1 mm margin for invasive carcinoma. We had her scheduled to start chemoradiation next week with weekly Cisplatin along with immunotherapy of pembrolizumab, but will now need to postpone that by 1 week. Patient states that she feels well but complains of left lower back/flank pain rating 3/10. During physical exam I noted that her groin incisions are still healing well with no drainage however, the entire area is erythematous and warm to touch consistent with cellulitis. I will prescribe Augmentin  875 mg BID for 10 days. Her day 1 cycle 1 of Cisplatin and Pembrolizumab is scheduled on 08/29/2023. However, she will come back in 1 week to be check by Kelli to make sure she can proceed on schedule. She has a WBC of 5.6, hemoglobin of 12.3, and platelet count of 224,000. Her CMP and magnesium are completely normal and her TSH and T4 levels are pending. We will see her back in 1 week with CBC, CMP, magnesium, TSH, and T4. I discussed the assessment and treatment plan with the patient.  She was provided an opportunity to ask questions and all were answered.  The patient was advised to call back if she has further questions.   I provided 20 minutes of face-to-face time during this this encounter and > 50% was spent counseling as documented under my assessment and plan.   [name], MD  [name] - A DEPT OF [name] [address] [place] [address] Dept: [phone] Dept Fax: [phone]   No orders of the defined types were placed in this encounter.   CHIEF COMPLAINTS/PURPOSE OF CONSULTATION:  Breast cancer, stage IIA  HISTORY OF PRESENTING ILLNESS:  Doris Reyes 73 y.o. 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  She does have annual mammograms because of her strong family history, but was on hormone replacement therapy for 10 years.  She had a screening mammogram at [facility] on May 05, 2021 which revealed architectural distortion which was felt to be indeterminant.  She had a diagnostic mammogram on March 3 which showed mild nipple retraction and a 1 cm area of focal asymmetry in the retroareolar region.  Ultrasound revealed a hypoechoic irregular mass measuring 9 mm in that area.  On March 14 she had a biopsy performed which revealed a 0.9 cm irregular mass with spiculated margins in the right breast at 12:00, approximately 2 cm from the nipple and anteriorly in the breast.  This was found to be grade 2 invasive mammary carcinoma as well as mammary carcinoma in situ.  The E-cadherin immunohistochemistry stains were negative and so this is consistent with invasive lobular carcinoma.  The estrogen receptors were positive at 95% and progesterone receptors positive at 95% with HER2 by immunohistochemistry positive at 2+ but FISH was negative.  Ki-67 was less than 5%.  The carcinoma in situ was positive for E-cadherin, consistent  with ductal carcinoma in situ.  She had an MRI of the breast and was found to have focal enhancement in the right breast surrounding the biopsy clip and some non-mass enhancement posterior to the malignancy.  It was recommended that she have an MRI guided biopsy of the clumped non-mass enhancement area and this revealed invasive lobular carcinoma grade 1 with lobular carcinoma in situ and extensive fibrocystic changes with focal intraluminal microcalcification and intraductal papillomatosis.  Due to her strong family history including multiple females with breast cancer in her mother with colon cancer at age 60, she did have genetic testing with [facility] and was found to have a carrier mutation of MUTYH.  This has been explained to  her that she may have some increased risk for colon cancer but the main concern is that she is a carrier with heterozygous mutation.  It was recommended that she have colonoscopy beginning at age 45 or 10 years prior to the age of her first-degree use relatives age at colorectal cancer diagnosis.  It was recommended this be repeated every 5 years.  This was explained to her.  She has now had a lumpectomy but had to go back for reexcision of the margins.  The sentinel lymph node was negative but she did have 2 primaries, measuring 2.4 cm and 2.2 cm, for a T2 N0 M0, stage IIA. This was ER/PR positive, HER 2 negative and a Ki-67 was 5%.  Even though she has several favorable characteristics, such as grade 1 histology and a low Ki 67, I still recommended that we pursue Endopredict testing to quantitate her risk for recurrence, and fortunately she has an EpClin score of 2.6, low risk. This correlates with a 5.1% risk of distant recurrence in the next 10 years, with only a 1.0% benefit of chemotherapy. Her risk of late recurrence is 4.0%. I recommended hormonal therapy but she refused.  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 PET scan revealed metastatic left inguinal lymph nodes up to 1.8 cm in diameter, but no additional evidence of metastatic disease in the neck, chest, abdomen or pelvis. There was a 4 mm right lower lobe nodule, too small for PET resolution.  Patient had a pelvic vulvectomy, bilateral debulking, radical lymphadenectomy, and inguinal dissection done on 06/28/2023.  Pathology revealed 3/4 positive nodes on the left side, up to 3 cm and with extensive extranodal tumor, and negative nodes on the right side. The vulvar carcinoma measured 3 cm with depth of invasion 5 mm and carcinoma in situ, for a T1b N2c M0. She had lymphovascular invasion and carcinoma in situ at the 6 o'clock margin with < 1 mm margin for invasive carcinoma.Diagnostic  bilateral mammogram done on 05/04/2023 was clear. Bone density scan done on 07/17/2023 revealed osteopenia.  I reviewed her records extensively and collaborated the history with the patient.  SUMMARY OF ONCOLOGIC HISTORY: Oncology History  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)  06/08/2021 Initial Diagnosis   Breast cancer in female [facility])   07/23/2021 Genetic Testing   Negative hereditary cancer genetic testing: no pathogenic variants detected in [facility] Panel. Report date is July 23, 2021.  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  The report date is Jul 26, 2021.  The BRCAplus panel offered by [facility] and includes sequencing and deletion/duplication analysis for the following 8  genes: ATM, BRCA1, BRCA2, CDH1, CHEK2, PALB2, PTEN, and TP53.  Results of pan-cancer panel pending.   The CancerNext-Expanded gene panel offered by [facility] and includes sequencing and rearrangement analysis for the following 77 genes: AIP, ALK, APC*, ATM*, AXIN2, BAP1, BARD1, BLM, BMPR1A, BRCA1*, BRCA2*, BRIP1*, CDC73, CDH1*, CDK4, CDKN1B, CDKN2A, CHEK2*, CTNNA1, DICER1, FANCC, FH, FLCN, GALNT12, KIF1B, LZTR1, MAX, MEN1, MET, MLH1*, MSH2*, MSH3, MSH6*, MUTYH*, NBN, NF1*, NF2, NTHL1, PALB2*, PHOX2B, PMS2*, POT1, PRKAR1A, PTCH1, PTEN*, RAD51C*, RAD51D*, RB1, RECQL, RET, SDHA, SDHAF2, SDHB, SDHC, SDHD, SMAD4, SMARCA4, SMARCB1, SMARCE1, STK11, SUFU, TMEM127, TP53*, TSC1, TSC2, VHL and XRCC2 (sequencing and deletion/duplication); EGFR, EGLN1, HOXB13, KIT, MITF, PDGFRA, POLD1, and POLE (sequencing only); EPCAM and GREM1 (deletion/duplication only). DNA and RNA analyses performed for * genes.    08/23/2021 Cancer Staging   Staging form: Breast, AJCC 8th Edition - Pathologic stage from 08/23/2021: Stage IA (pT2(2), pN0(sn), cM0, G1, ER+, PR+, HER2-) - Signed by [name], MD on 09/29/2021 Histopathologic type: Lobular carcinoma, NOS Stage prefix: Initial diagnosis Method of lymph node assessment: Sentinel lymph node biopsy Nuclear grade: G1 Multigene prognostic tests performed: EndoPredict Histologic grading system: 3 grade system Residual tumor (R): R0 - None Laterality: Right Tumor size (mm): 24 Multiple tumors: Yes Number of tumors: 2 Lymph-vascular invasion (LVI): LVI not present (absent)/not identified Diagnostic confirmation: Positive histology PLUS positive immunophenotyping and/or positive genetic studies Specimen type: Excision Staged by: Managing physician Menopausal status: Postmenopausal Ki-67 (%): 5 Stage used in treatment planning: Yes National guidelines used in treatment planning: Yes Type of national guideline used in treatment planning: NCCN   Vulvar cancer (HCC)  06/28/2023 Initial Diagnosis   Vulvar cancer (HCC)   06/28/2023 Cancer Staging   Staging form: Vulva, AJCC V9 - Clinical stage from 06/28/2023: FIGO Stage IIIC (cT1b, cN1c, cM0) - Signed by [name], MD on 07/31/2023 Histopathologic type: Squamous cell carcinoma, NOS Stage prefix: Initial diagnosis Method of lymph node assessment: Lymph node dissection Histologic grade (G): G2 Histologic grading system: 3 grade system Tumor size (mm): 30 Lymph-vascular invasion (LVI): LVI present/identified, NOS Diagnostic confirmation: Positive histology Specimen type: Excision Staged by: Managing physician Femoral-inguinal nodal status: Positive Solitary (s) or multifocal (m) tumors in the primary site: Solitary Perineural invasion (PNI): Unknown Stage used in treatment planning: Yes National guidelines used in treatment planning: Yes Type of national guideline used in treatment planning: NCCN   08/29/2023 -  Chemotherapy   Patient is on Treatment Plan : CERVICAL Cisplatin (40) q7d + Pembrolizumab q21d + XRT     10/09/2023 -  Chemotherapy   Patient is on  Treatment Plan : CERVICAL Pembrolizumab (200) q21d     In terms of breast cancer risk profile:  She menarched at early age of 82-13 and went to surgical menopause at age 10 She had 1 pregnancy, her first child was born at age 7 She was exposed hormone replacement therapy for 10 years in the form of a patch.  She has significant positive family history of Breast and ovarian and colon cancer  INTERVAL HISTORY: Doris Reyes is here today for a follow up for newly diagnosed vulvar carcinoma. She also had a breast cancer stage IIA, diagnosed in March of 2023. 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 PET scan revealed metastatic left inguinal lymph nodes up to 1.8 cm in diameter, but  no additional evidence of metastatic disease in the neck, chest, abdomen or pelvis. There was a 4 mm right lower lobe nodule, too small for PET resolution.  Patient had a pelvic vulvectomy, bilateral debulking, radical lymphadenectomy, and inguinal dissection done on 06/28/2023.  Pathology revealed 3/4 positive nodes on the left side, up to 3 cm and with extensive extranodal tumor, and negative nodes on the right side. The vulvar carcinoma measured 3 cm with depth of invasion 5 mm and carcinoma in situ, for a T1b N2c M0. She had lymphovascular invasion and carcinoma in situ at the 6 o'clock margin with < 1 mm margin for invasive carcinoma. We had her scheduled to start chemoradiation next week with weekly Cisplatin but will now need to postpone that by 1 week. Patient states that she feels well but complains of left lower back/flank pain rating 3/10. During physical exam I noted that her groin incisions are still healing well with no drainage however, the entire area is erythematous and warm to touch consistent with cellulitis. I will prescribed Augmentin  875mg  BID for 10 days. Her day  1 cycle 1 of Cisplatin and Pembrolizumab is scheduled on 08/29/2023. However, she will come back in 1 week to be check by Kelli to make sure she can proceed on schedule. She has a WBC of 5.6, hemoglobin of 12.3, and platelet count of 224,000. Her CMP and magnesium are completely normal and her TSH and T4 levels are pending. We will see her back in 1 week with CBC, CMP, magnesium, TSH, and T4.  She denies fever, chills, night sweats, or other signs of infection. She denies cardiorespiratory and gastrointestinal issues. She  denies pain. Her appetite is good and Her weight has increased 1 pounds over last week.      MEDICAL HISTORY:  Past Medical History:  Diagnosis Date   Anemia    Anxiety    Cancer (HCC)    right breast ILC   Complication of anesthesia    difficulty waking up   Depression    Diverticulitis    Family history of breast cancer    Family history of ovarian cancer    Family history of prostate cancer    Fibromyalgia    GERD (gastroesophageal reflux disease)    Heart murmur    History of colonic polyps    History of hiatal hernia    Hyperlipidemia    Hypertension    IBS (irritable bowel syndrome)    Ischemic colitis (HCC) 03/2012   Osteoporosis    Pre-diabetes    RLS (restless legs syndrome)    Sinusitis    Sleep apnea    CPAP nightly  Vitamin D deficiency Iron deficiency treated with IV iron infusions in 2019  SURGICAL HISTORY: Past Surgical History:  Procedure Laterality Date   BREAST LUMPECTOMY WITH RADIOACTIVE SEED LOCALIZATION Right 08/04/2021   Procedure: RIGHT BREAST LUMPECTOMY WITH RADIOACTIVE SEED LOCALIZATION;  Surgeon: Sim Dryer, MD;  Location: [facility];  Service: General;  Laterality: Right;   CHOLECYSTECTOMY  2004   COLONOSCOPY WITH PROPOFOL  N/A 04/18/2017   Procedure: COLONOSCOPY WITH PROPOFOL ;  Surgeon: Tami Falcon, MD;  Location: [facility];  Service: Endoscopy;  Laterality: N/A;   ESOPHAGOGASTRODUODENOSCOPY (EGD) WITH PROPOFOL  N/A  04/18/2017   Procedure: ESOPHAGOGASTRODUODENOSCOPY (EGD) WITH PROPOFOL ;  Surgeon: Tami Falcon, MD;  Location: [facility];  Service: Endoscopy;  Laterality: N/A;   EXAM UNDER ANESTHESIA, PELVIC N/A 06/28/2023   Procedure: EXAM UNDER ANESTHESIA, PELVIC;  Surgeon: Suzi Essex, MD;  Location:  [facility];  Service: Gynecology;  Laterality: N/A;   FLEXIBLE SIGMOIDOSCOPY  04/20/2012   Procedure: FLEXIBLE SIGMOIDOSCOPY;  Surgeon: Almeda Aris, MD;  Location: [facility];  Service: Endoscopy;  Laterality: N/A;   INGUINAL LYMPHADENECTOMY N/A 06/28/2023   Procedure: LYMPHADENECTOMY, INGUINAL, OPEN;  Surgeon: Suzi Essex, MD;  Location: [facility];  Service: Gynecology;  Laterality: N/A;   IR IMAGING GUIDED PORT INSERTION  07/31/2023   NISSEN FUNDOPLICATION  2011   PARAESOPHAGEAL HERNIA REPAIR  09/11/2009   and Nissen fundoplication   RADICAL VULVECTOMY N/A 06/28/2023   Procedure: VULVECTOMY, RADICAL;  Surgeon: Suzi Essex, MD;  Location: [facility];  Service: Gynecology;  Laterality: N/A;  Possible skin flap   RE-EXCISION OF BREAST LUMPECTOMY Right 08/18/2021   Procedure: RE-EXCISION RIGHT BREAST LUMPECTOMY;  Surgeon: Sim Dryer, MD;  Location: [facility];  Service: General;  Laterality: Right;   SENTINEL NODE BIOPSY N/A 08/04/2021   Procedure: SENTINEL NODE BIOPSY;  Surgeon: Sim Dryer, MD;  Location: [facility];  Service: General;  Laterality: N/A;   TONSILLECTOMY  1960's   TOTAL ABDOMINAL HYSTERECTOMY  2001   w/ BSO  She had 2 large benign tumors of the ovaries removed with bilateral salpingo oophorectomy  SOCIAL HISTORY: Social History   Socioeconomic History   Marital status: Widowed    Spouse name: Royston Cornea   Number of children: 1   Years of education: Not on file   Highest education level: Some college, no degree  Occupational History   Occupation: disabled  Tobacco Use   Smoking status: Never   Smokeless tobacco: Never   Tobacco comments:    brief  exposure through her husband  Vaping Use   Vaping status: Never Used  Substance and Sexual Activity   Alcohol use: Never    Alcohol/week: 0.0 standard drinks of alcohol   Drug use: Never   Sexual activity: Not Currently  Other Topics Concern   Not on file  Social History Narrative   From [place] originally. Previously lived in [place]. Previously worked at [employer] also in a [employer]. Has also worked as a Diplomatic Services operational officer for [employer]. No pets currently. No bird exposure. No known mold in her current home.    Lives with husband   Caffeine- coffee 2 c daily   Social Drivers of Health   Financial Resource Strain: Low Risk  (02/01/2022)   Overall Financial Resource Strain (CARDIA)    Difficulty of Paying Living Expenses: Not hard at all  Food Insecurity: No Food Insecurity (06/28/2023)   Hunger Vital Sign    Worried About Running Out of Food in the Last Year: Never true    Ran Out of Food in the Last Year: Never true  Transportation Needs: No Transportation Needs (06/28/2023)   PRAPARE - Administrator, Civil Service (Medical): No    Lack of Transportation (Non-Medical): No  Physical Activity: Not on file  Stress: Not on file  Social Connections: Moderately Isolated (06/28/2023)   Social Connection and Isolation Panel [NHANES]    Frequency of Communication with Friends and Family: More than three times a week    Frequency of Social Gatherings with Friends and Family: Twice a week    Attends Religious Services: More than 4 times per year    Active Member of [employer] or Organizations: No    Attends Banker Meetings: Never    Marital Status: Widowed  Intimate Partner Violence: Not At Risk (06/28/2023)   Humiliation, Afraid,  Rape, and Kick questionnaire    Fear of Current or Ex-Partner: No    Emotionally Abused: No    Physically Abused: No    Sexually Abused: No   The patient is here with her husband Arch Ko and daughter Hospital doctor today.  She has never smoked and  does not drink alcohol or use drugs. FAMILY HISTORY: Family History  Problem Relation Age of Onset   Colon cancer Mother 52   Alzheimer's disease Mother    Heart attack Father    Cancer Maternal Aunt        x2   Alzheimer's disease Maternal Aunt    Ovarian cancer Maternal Aunt    Prostate cancer Maternal Uncle    Kidney disease Maternal Uncle    Atrial fibrillation Maternal Uncle    Stroke Paternal Uncle    Aneurysm Paternal Grandmother        brain   Stroke Paternal Grandfather    Prostate cancer Cousin        paternal first cousin   Prostate cancer Cousin        paternal first cousin   Esophageal cancer Cousin        maternal first cousin   Breast cancer Cousin        DCIS, maternal first cousin   Ovarian cancer Cousin        maternal first cousin   Rheum arthritis Other    Lung disease Neg Hx   Ovarian cancer                                               maternal aunt                         36's Breast cancer                                                 cousin                                     26's Prostate cancer                                              maternal uncle                        26's  ALLERGIES:  is allergic to ceftin [cefuroxime axetil], cefuroxime, other, sulfa  antibiotics, and tramadol hcl.  MEDICATIONS:  Current Outpatient Medications  Medication Sig Dispense Refill   acetaminophen  (TYLENOL ) 650 MG CR tablet 1,300 mg in the morning and at bedtime.     ALPRAZolam  (XANAX ) 0.25 MG tablet Take 0.25 mg by mouth at bedtime as needed for anxiety.     amLODipine  (NORVASC ) 5 MG tablet Take 5 mg by mouth daily.     amoxicillin -clavulanate (AUGMENTIN ) 875-125 MG tablet Take 1 tablet by mouth 2 (two) times daily for 10 days. 20 tablet 0   Ascorbic Acid (VITAMIN C) 500 MG CHEW Chew 500 mg  by mouth daily.     aspirin EC 81 MG tablet Take 81 mg by mouth at bedtime. Swallow whole.     atenolol  (TENORMIN ) 50 MG tablet Take 75 mg by mouth daily.       benazepril  (LOTENSIN ) 10 MG tablet Take 10 mg by mouth daily.     benzonatate (TESSALON) 100 MG capsule Take 100-200 mg by mouth at bedtime.     Biotin 1000 MCG tablet Take 1,000 mcg by mouth daily.     Calcium  Citrate-Vitamin D (CITRACAL + D PO) Take 1 tablet by mouth daily.     cetirizine (ZYRTEC) 10 MG tablet Take 10 mg by mouth at bedtime.     Cholecalciferol (VITAMIN D) 50 MCG (2000 UT) tablet Take 2,000 Units by mouth daily.     cyanocobalamin 1000 MCG tablet Take 1,000 mcg by mouth daily.     diclofenac sodium (VOLTAREN) 1 % GEL Apply 1 application  topically daily as needed (for pain).     diphenhydrAMINE  (BENADRYL ) 25 MG tablet Take 25 mg by mouth every 6 (six) hours as needed for allergies.     diphenoxylate-atropine (LOMOTIL) 2.5-0.025 MG tablet Take 2 tablets by mouth 4 (four) times daily as needed for diarrhea or loose stools.     DULoxetine  (CYMBALTA ) 60 MG capsule Take 120 mg by mouth at bedtime.     EPINEPHrine  (EPIPEN  2-PAK) 0.3 mg/0.3 mL IJ SOAJ injection Inject 0.3 mg into the muscle as needed for anaphylaxis.     estradiol  (ESTRACE  VAGINAL) 0.1 MG/GM vaginal cream Place 1 Applicatorful vaginally 3 (three) times a week. 42.5 g 12   famotidine  (PEPCID ) 20 MG tablet Take 20 mg by mouth at bedtime.     fexofenadine (ALLEGRA) 180 MG tablet Take 180 mg by mouth in the morning.     fluconazole  (DIFLUCAN ) 100 MG tablet Take 1 tablet (100 mg total) by mouth daily. 10 tablet 1   fluticasone  (FLONASE ) 50 MCG/ACT nasal spray Place 1 spray into both nostrils daily.     lidocaine  (XYLOCAINE ) 5 % ointment Apply 1 Application topically 2 (two) times daily as needed for moderate pain (pain score 4-6) (to the vulva). Apply to vulva as needed for discomfort 35.44 g 3   lidocaine -prilocaine  (EMLA ) cream Apply to affected area once 30 g 3   NON FORMULARY Pt uses a c-pap nightly     Nutritional Supplements (JUICE PLUS FIBRE PO) Take 2 each by mouth daily. Fruits and Vegetables     Omega-3 Fatty  Acids (FISH OIL) 1000 MG CAPS Take 1,000 mg by mouth daily.      ondansetron  (ZOFRAN ) 4 MG tablet Take 4 mg by mouth every 8 (eight) hours as needed for nausea or vomiting.     ondansetron  (ZOFRAN ) 8 MG tablet Take 1 tablet (8 mg total) by mouth every 8hrs as needed for nausea or vomiting. Start on the third day after cisplatin. 30 tablet 1   pantoprazole  (PROTONIX ) 40 MG tablet Take 40 mg by mouth every evening.     Phenylephrine -APAP-Guaifenesin (TYLENOL  SINUS SEVERE PO) Take 2 tablets by mouth daily as needed (drainage).     polyvinyl alcohol (LIQUIFILM TEARS) 1.4 % ophthalmic solution Place 1 drop into both eyes 5 (five) times daily.     Probiotic Product (PROBIOTIC & ACIDOPHILUS EX ST PO) Take 1 capsule by mouth daily. Ultra Flora Plus Capsules     prochlorperazine  (COMPAZINE ) 10 MG tablet Take 1 tablet (10 mg total) by mouth every 6 (six) hours as  needed for nausea or vomiting. 30 tablet 1   Propylene Glycol (SYSTANE COMPLETE) 0.6 % SOLN Place 1 drop into both eyes in the morning and at bedtime.     rOPINIRole  (REQUIP ) 0.5 MG tablet Take 0.5 mg by mouth at bedtime.     senna (SENOKOT) 8.6 MG tablet 2 tablets at bedtime as needed Orally Once a day     senna-docusate (SENOKOT-S) 8.6-50 MG tablet Take 2 tablets by mouth at bedtime. For AFTER surgery, do not take if having diarrhea (Patient taking differently: Take 1 tablet by mouth at bedtime.) 30 tablet 0   simvastatin  (ZOCOR ) 10 MG tablet Take 10 mg by mouth at bedtime.       sucralfate  (CARAFATE ) 1 GM/10ML suspension Take 1 g by mouth 2 (two) times daily.     traZODone  (DESYREL ) 50 MG tablet Take 50 mg by mouth at bedtime.     triamcinolone  (KENALOG ) 0.025 % ointment Apply 1 Application topically 2 (two) times daily. 30 g 0   vitamin E 400 UNIT capsule Take 400 Units by mouth daily.       White Petrolatum -Mineral Oil (REFRESH P.M. OP) Place 1 Application into both eyes at bedtime.     No current facility-administered medications for this  visit.   REVIEW OF SYSTEMS:   Review of Systems  Constitutional: Negative.  Negative for appetite change, chills, diaphoresis, fatigue, fever and unexpected weight change.  HENT:  Negative.  Negative for hearing loss, lump/mass, mouth sores, nosebleeds, sore throat, tinnitus, trouble swallowing and voice change.   Eyes: Negative.  Negative for eye problems and icterus.  Respiratory: Negative.  Negative for chest tightness, cough, hemoptysis, shortness of breath and wheezing.   Cardiovascular: Negative.  Negative for chest pain, leg swelling and palpitations.  Gastrointestinal: Negative.  Negative for abdominal distention, abdominal pain, blood in stool, constipation, diarrhea, nausea, rectal pain and vomiting.  Endocrine: Negative.   Genitourinary: Negative.  Negative for bladder incontinence, difficulty urinating, dyspareunia, dysuria, frequency, hematuria, menstrual problem, nocturia, pelvic pain, vaginal bleeding and vaginal discharge.        Pain of her vulva and bilateral groin area rating a 2/10 after taking tylenol .  Musculoskeletal:  Positive for arthralgias (left foot) and back pain (lower back/flank, 3/10). Negative for flank pain, gait problem, myalgias, neck pain and neck stiffness.  Skin: Negative.  Negative for itching, rash and wound.  Neurological:  Negative for dizziness, extremity weakness, gait problem, headaches, light-headedness, numbness, seizures and speech difficulty.  Hematological: Negative.  Negative for adenopathy. Does not bruise/bleed easily.  Psychiatric/Behavioral: Negative.  Negative for confusion, decreased concentration, depression, sleep disturbance and suicidal ideas. The patient is not nervous/anxious.    PHYSICAL EXAMINATION: ECOG PERFORMANCE STATUS: 1 - Symptomatic but completely ambulatory  Physical Exam Vitals and nursing note reviewed.  Constitutional:      General: She is not in acute distress.    Appearance: Normal appearance. She is normal  weight. She is not ill-appearing, toxic-appearing or diaphoretic.  HENT:     Head: Normocephalic and atraumatic.     Right Ear: Tympanic membrane, ear canal and external ear normal. There is no impacted cerumen.     Left Ear: Tympanic membrane, ear canal and external ear normal. There is no impacted cerumen.     Nose: Nose normal. No congestion or rhinorrhea.     Mouth/Throat:     Mouth: Mucous membranes are moist.     Pharynx: Oropharynx is clear. No oropharyngeal exudate or posterior oropharyngeal erythema.  Eyes:  General: No scleral icterus.       Right eye: No discharge.        Left eye: No discharge.     Extraocular Movements: Extraocular movements intact.     Conjunctiva/sclera: Conjunctivae normal.     Pupils: Pupils are equal, round, and reactive to light.  Neck:     Vascular: No carotid bruit.  Cardiovascular:     Rate and Rhythm: Normal rate and regular rhythm.     Pulses: Normal pulses.     Heart sounds: Normal heart sounds. No murmur heard.    No friction rub. No gallop.  Pulmonary:     Effort: Pulmonary effort is normal. No respiratory distress.     Breath sounds: Normal breath sounds. No stridor. No wheezing, rhonchi or rales.  Chest:     Chest wall: No tenderness.     Comments: No masses in either breast Well healed right axillary scar Abdominal:     General: Bowel sounds are normal. There is no distension.     Palpations: Abdomen is soft. There is no hepatomegaly, splenomegaly or mass.     Tenderness: There is no abdominal tenderness. There is no right CVA tenderness, left CVA tenderness, guarding or rebound.     Hernia: No hernia is present.  Genitourinary:    Comments: Her groin incisions are still healing well with no drainage but the entire area is erythematous and warm to touch consistent with cellulitis.  Musculoskeletal:        General: No swelling, deformity or signs of injury. Normal range of motion.     Right shoulder: No tenderness. Normal range  of motion.     Cervical back: Normal range of motion and neck supple. No rigidity or tenderness.     Right lower leg: No edema.     Left lower leg: No edema.  Lymphadenopathy:     Cervical: No cervical adenopathy.     Right cervical: No superficial, deep or posterior cervical adenopathy.    Left cervical: No superficial, deep or posterior cervical adenopathy.     Upper Body:     Right upper body: No supraclavicular, axillary or pectoral adenopathy.     Left upper body: No supraclavicular, axillary or pectoral adenopathy.  Skin:    General: Skin is warm and dry.     Coloration: Skin is not jaundiced or pale.     Findings: No bruising, erythema, lesion or rash.  Neurological:     General: No focal deficit present.     Mental Status: She is alert and oriented to person, place, and time. Mental status is at baseline.     Cranial Nerves: No cranial nerve deficit.     Sensory: No sensory deficit.     Motor: No weakness.     Coordination: Coordination normal.     Gait: Gait normal.     Deep Tendon Reflexes: Reflexes normal.  Psychiatric:        Mood and Affect: Mood normal.        Behavior: Behavior normal.        Thought Content: Thought content normal.        Judgment: Judgment normal.    LABORATORY DATA:  I have reviewed the data as listed Lab Results  Component Value Date   WBC 5.6 08/18/2023   HGB 12.3 08/18/2023   HCT 37.8 08/18/2023   MCV 84.9 08/18/2023   PLT 224 08/18/2023   Lab Results  Component Value Date   CREATININE 0.98  08/18/2023   BUN 19 08/18/2023   NA 139 08/18/2023   K 3.9 08/18/2023   CL 103 08/18/2023   CO2 23 08/18/2023      Component Value Date/Time   PROT 6.5 08/18/2023 1403   ALBUMIN 4.4 08/18/2023 1403   AST 18 08/18/2023 1403   AST 16 07/20/2023 1101   ALT 16 08/18/2023 1403   ALT 13 07/20/2023 1101   ALKPHOS 75 08/18/2023 1403   BILITOT <0.2 08/18/2023 1403   BILITOT 0.2 07/20/2023 1101   Lab Results  Component Value Date   TSH  0.771 08/18/2023   T4TOTAL 8.2 08/18/2023   PATHOLOGY:   SURGICAL PATHOLOGY CASE: [id number] PATIENT: Doris Reyes Surgical Pathology Report  Clinical History: Vulvar cancer (jlr)  FINAL MICROSCOPIC DIAGNOSIS:  A. 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 Largest metastasis is 3 cm. Extensive extranodal tumor.  B. LYMPH NODE, RIGHT INGUINAL, RESECTION: Two lymph nodes negative for metastatic carcinoma (0/2).  C. 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 Greatest depth of invasion is 5 mm. Carcinoma in situ focally involves the 6:00 margin. Invasive carcinoma focally less than 1 mm from 6:00 margin. Lymphovascular space involvement by carcinoma. See oncology table and comment.  D. LATERAL DEEP MARGIN: Benign adipose tissue and connective tissue. Negative for carcinoma. Final lateral deep margin negative for carcinoma.  ONCOLOGY TABLE: VULVA, CARCINOMA: Resection Procedure: Pelvic vulvectomy and inguinal lymphadenectomy Tumor Focality: Unifocal Tumor Site: Left vulva Tumor Size: 3 x 2.8 cm Histologic Type: Squamous cell carcinoma Histologic Grade: Moderately differentiated Depth of Invasion:      Specify depth of invasion (mm): 5 mm Other Tissue/ Organ Involvement: Not applicable Lymphovascular Invasion: Present Margins:      Margins Involved by Invasive Carcinoma: All margins negative for invasive carcinoma      Invasive carcinoma focally less than 1 mm from 6:00 margin      Margin Status for HSIL or dVIN: 6:00 margin focally positive for carcinoma in situ Regional Lymph Nodes           Lymph Nodes Examined:                                   0 Sentinel                                   6 non-sentinel                                   6 total           Number of Nodes with Metastasis 5 mm or Greater: 2           Number  of Nodes with Metastasis Less than 5 mm (excludes isolated tumor cells): 1           Number of Nodes with Isolated Tumor Cells (0.2 mm or less): 0           Additional Lymph Node Findings: Extensive extranodal extension Distant Metastasis:      Distant Site(s) Involved: Not applicable Pathologic Stage Classification (pTNM, AJCC 8th Edition): pT1b (IB), pN2c (IIIC) Ancillary Studies: Can be performed if requested Representative Tumor Block: C3-C5 Comment(s):  The tumor is moderately differentiated invasive squamous cell carcinoma which is 3 cm in greatest dimension and shows 5 mm in greatest stromal invasion.  There is associated carcinoma in situ which focally involves the 6:00 margin and invasive carcinoma is focally less than 1 mm from the 6:00 margin.  Invasive carcinoma is also focally 2 mm from the medial dermal margin in the midportion of the specimen. (v4.2.0.2)    RADIOGRAPHIC STUDIES: IR IMAGING GUIDED PORT INSERTION Result Date: 07/31/2023 CLINICAL DATA:  Vulvar carcinoma and need for porta cath for chemotherapy. EXAM: IMPLANTED PORT A CATH PLACEMENT WITH ULTRASOUND AND FLUOROSCOPIC GUIDANCE ANESTHESIA/SEDATION: Moderate (conscious) sedation was employed during this procedure. A total of Versed  2.0 mg and Fentanyl  100 mcg was administered intravenously. Moderate Sedation Time: 34 minutes. The patient's level of consciousness and vital signs were monitored continuously by radiology nursing throughout the procedure under my direct supervision. FLUOROSCOPY: 36 seconds.  6.0 mGy. PROCEDURE: The procedure, risks, benefits, and alternatives were explained to the patient. Questions regarding the procedure were encouraged and answered. The patient understands and consents to the procedure. A time-out was performed prior to initiating the procedure. Ultrasound was utilized to confirm patency of the right internal jugular vein. An ultrasound image was saved in recorder. The right neck and chest  were prepped with chlorhexidine  in a sterile fashion, and a sterile drape was applied covering the operative field. Maximum barrier sterile technique with sterile gowns and gloves were used for the procedure. Local anesthesia was provided with 1% lidocaine . After creating a small venotomy incision, a 21 gauge needle was advanced into the right internal jugular vein under direct, real-time ultrasound guidance. Ultrasound image documentation was performed. After securing guidewire access, an 8 Fr dilator was placed. A J-wire was kinked to measure appropriate catheter length. A subcutaneous port pocket was then created along the upper chest wall utilizing sharp and blunt dissection. Portable cautery was utilized. The pocket was irrigated with sterile saline. A single lumen power injectable port was chosen for placement. The 8 Fr catheter was tunneled from the port pocket site to the venotomy incision. The port was placed in the pocket. External catheter was trimmed to appropriate length based on guidewire measurement. At the venotomy, an 8 Fr peel-away sheath was placed over a guidewire. The catheter was then placed through the sheath and the sheath removed. Final catheter positioning was confirmed and documented with a fluoroscopic spot image. The port was accessed with a needle and aspirated and flushed with heparinized saline. The access needle was removed. The venotomy and port pocket incisions were closed with subcutaneous 3-0 Monocryl and subcuticular 4-0 Vicryl. Dermabond was applied to both incisions. COMPLICATIONS: COMPLICATIONS None FINDINGS: After catheter placement, the tip lies at the cavo-atrial junction. The catheter aspirates normally and is ready for immediate use. IMPRESSION: Placement of single lumen port a cath via right internal jugular vein. The catheter tip lies at the cavo-atrial junction. A power injectable port a cath was placed and is ready for immediate use. Electronically Signed   By: Erica Hau M.D.   On: 07/31/2023 10:57   EXAM: 07/17/2023 DUAL X-RAY ABSORPTIOMETRY (DXA) FOR BONE MINERAL DENSITY LUMBAR SPINE (L2-L4): BMD (in g/cm2): 0.912 T-score: -2.4 Z-score: -0.7   LEFT FEMORAL NECK: BMD (in g/cm2): 0.793 T-score: -1.8 Z-score: 0.0   LEFT TOTAL HIP: BMD (in g/cm2): 0.922 T-score: -0.7 Z-score: 0.9   RIGHT FEMORAL NECK: BMD (in g/cm2): 0.855 T-score: -1.3 Z-score: 0.5   RIGHT TOTAL HIP: BMD (in  g/cm2): 0.896 T-score: -0.9 Z-score: 0.7    EXAM: 06/05/2023 NUCLEAR MEDICINE PET SKULL BASE TO THIGH IMPRESSION: 1. Metastatic left inguinal lymph nodes. No additional evidence of metastatic disease in the neck, chest, abdomen or pelvis. 2. 4 mm right lower lobe nodule, too small for PET resolution. Recommend attention on follow-up. 3. Small to moderate hiatal hernia. 4.  Aortic atherosclerosis (ICD10-I70.0).     I,Jasmine M Lassiter,acting as a scribe for [name], MD.,have documented all relevant documentation on the behalf of [name], MD,as directed by  [name], MD while in the presence of [name], MD.

## 2023-08-18 ENCOUNTER — Other Ambulatory Visit: Payer: Self-pay | Admitting: Oncology

## 2023-08-18 ENCOUNTER — Other Ambulatory Visit: Payer: Self-pay | Admitting: Pharmacist

## 2023-08-18 ENCOUNTER — Inpatient Hospital Stay

## 2023-08-18 ENCOUNTER — Encounter: Payer: Self-pay | Admitting: Oncology

## 2023-08-18 ENCOUNTER — Inpatient Hospital Stay (HOSPITAL_BASED_OUTPATIENT_CLINIC_OR_DEPARTMENT_OTHER): Admitting: Oncology

## 2023-08-18 VITALS — BP 136/70 | HR 56 | Temp 97.7°F | Resp 18 | Ht 63.0 in | Wt 213.2 lb

## 2023-08-18 DIAGNOSIS — C519 Malignant neoplasm of vulva, unspecified: Secondary | ICD-10-CM

## 2023-08-18 DIAGNOSIS — L03315 Cellulitis of perineum: Secondary | ICD-10-CM

## 2023-08-18 DIAGNOSIS — C50111 Malignant neoplasm of central portion of right female breast: Secondary | ICD-10-CM

## 2023-08-18 DIAGNOSIS — Z90710 Acquired absence of both cervix and uterus: Secondary | ICD-10-CM | POA: Diagnosis not present

## 2023-08-18 DIAGNOSIS — Z79899 Other long term (current) drug therapy: Secondary | ICD-10-CM | POA: Diagnosis not present

## 2023-08-18 DIAGNOSIS — Z90722 Acquired absence of ovaries, bilateral: Secondary | ICD-10-CM | POA: Diagnosis not present

## 2023-08-18 DIAGNOSIS — Z853 Personal history of malignant neoplasm of breast: Secondary | ICD-10-CM | POA: Diagnosis not present

## 2023-08-18 LAB — COMPREHENSIVE METABOLIC PANEL WITH GFR
ALT: 16 U/L (ref 0–44)
AST: 18 U/L (ref 15–41)
Albumin: 4.4 g/dL (ref 3.5–5.0)
Alkaline Phosphatase: 75 U/L (ref 38–126)
Anion gap: 12 (ref 5–15)
BUN: 19 mg/dL (ref 8–23)
CO2: 23 mmol/L (ref 22–32)
Calcium: 10 mg/dL (ref 8.9–10.3)
Chloride: 103 mmol/L (ref 98–111)
Creatinine, Ser: 0.98 mg/dL (ref 0.44–1.00)
GFR, Estimated: >60 mL/min (ref >60)
Glucose, Bld: 123 mg/dL — ABNORMAL HIGH (ref 70–99)
Potassium: 3.9 mmol/L (ref 3.5–5.1)
Sodium: 139 mmol/L (ref 135–145)
Total Bilirubin: <0.2 mg/dL (ref 0.0–1.2)
Total Protein: 6.5 g/dL (ref 6.5–8.1)

## 2023-08-18 LAB — CBC WITH DIFFERENTIAL (CANCER CENTER ONLY)
Abs Immature Granulocytes: 0.02 10*3/uL (ref 0.00–0.07)
Basophils Absolute: 0 10*3/uL (ref 0.0–0.1)
Basophils Relative: 1 %
Eosinophils Absolute: 0.3 10*3/uL (ref 0.0–0.5)
Eosinophils Relative: 6 %
HCT: 37.8 % (ref 36.0–46.0)
Hemoglobin: 12.3 g/dL (ref 12.0–15.0)
Immature Granulocytes: 0 %
Lymphocytes Relative: 27 %
Lymphs Abs: 1.5 10*3/uL (ref 0.7–4.0)
MCH: 27.6 pg (ref 26.0–34.0)
MCHC: 32.5 g/dL (ref 30.0–36.0)
MCV: 84.9 fL (ref 80.0–100.0)
Monocytes Absolute: 0.7 10*3/uL (ref 0.1–1.0)
Monocytes Relative: 12 %
Neutro Abs: 3.1 10*3/uL (ref 1.7–7.7)
Neutrophils Relative %: 54 %
Platelet Count: 224 10*3/uL (ref 150–400)
RBC: 4.45 MIL/uL (ref 3.87–5.11)
RDW: 13.2 % (ref 11.5–15.5)
WBC Count: 5.6 10*3/uL (ref 4.0–10.5)
nRBC: 0 % (ref 0.0–0.2)

## 2023-08-18 LAB — TSH: TSH: 0.771 u[IU]/mL (ref 0.350–4.500)

## 2023-08-18 LAB — MAGNESIUM: Magnesium: 2 mg/dL (ref 1.7–2.4)

## 2023-08-18 MED ORDER — AMOXICILLIN-POT CLAVULANATE 875-125 MG PO TABS: 1.0000 | ORAL_TABLET | Freq: Two times a day (BID) | ORAL | 0 refills | Status: DC

## 2023-08-19 LAB — T4: T4, Total: 8.2 ug/dL (ref 4.5–12.0)

## 2023-08-22 ENCOUNTER — Inpatient Hospital Stay: Admitting: Oncology

## 2023-08-22 ENCOUNTER — Inpatient Hospital Stay

## 2023-08-22 ENCOUNTER — Ambulatory Visit

## 2023-08-22 DIAGNOSIS — C774 Secondary and unspecified malignant neoplasm of inguinal and lower limb lymph nodes: Secondary | ICD-10-CM | POA: Diagnosis not present

## 2023-08-22 DIAGNOSIS — C519 Malignant neoplasm of vulva, unspecified: Secondary | ICD-10-CM | POA: Diagnosis not present

## 2023-08-22 DIAGNOSIS — Z510 Encounter for antineoplastic radiation therapy: Secondary | ICD-10-CM | POA: Diagnosis not present

## 2023-08-22 DIAGNOSIS — Z853 Personal history of malignant neoplasm of breast: Secondary | ICD-10-CM | POA: Diagnosis not present

## 2023-08-23 NOTE — Progress Notes (Signed)
 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 There was lymphovascular invasion and carcinoma in situ at the 6 o'clock margin with < 1 mm margin for invasive carcinoma. PET scan revealed metastatic left inguinal lymph nodes up to 1.8 cm in diameter, but no additional evidence of metastatic disease in the neck, chest, abdomen or pelvis. There was Doris 4 mm right lower lobe nodule, too small for PET resolution.   We plan concurrent chemoradiation with cisplatin. She will also have immunotherapy with pembrolizuab since her PDL 1 score is 85%, and continue pembrolizumab maintenance after the other is completed. Treatment was held last week due to new cellulitis lower abdomen. This is improving but her vulvar incision is not completely healed. She also had vulvar candidiasis and probable candidiasis of the skin under the pannus.  We will continue Augmentin  another 10 days and place her on fluconazole  for 10 days. We will plan to see her back in 1 week for re-evalauation prior to starting adjuvant chemoradiation.    The patient was seen, examined and plan formulated with Dr. Almer Reyes.  The patient understands the plans discussed today and is in agreement with them.  She knows to contact our office if she develops concerns prior to her next appointment.   I provided 20 minutes of face-to-face time during this encounter and > 50% was spent counseling as documented under  my assessment and plan.    Doris Reyes Doris Jazminn Pomales, PA-C  [facility] Doris Reyes. [place] Reyes [address] [place] [address] Dept: [phone] Dept Fax: [phone]   No orders of the defined types were placed in this encounter.     CHIEF COMPLAINT:  CC: Stage IIIC vulvar cancer  Current Treatment: Adjuvant chemoradiation with weekly cisplatin and pembrolizumab every 3 weeks  HISTORY OF PRESENT ILLNESS:   Oncology History  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)  06/08/2021 Initial Diagnosis   Breast cancer in female Doris Reyes)   07/23/2021 Genetic Testing   Negative hereditary cancer genetic testing: no pathogenic variants detected in [facility] BRCAPlus Panel. Report date is July 23, 2021.  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  The patient is Doris carrier for MYH-associated polyposis but is not affected.  The report date is Jul 26, 2021.  The BRCAplus panel offered by [employer] and includes sequencing and deletion/duplication analysis for the following 8 genes: ATM, BRCA1, BRCA2, CDH1, CHEK2, PALB2, PTEN, and TP53.  Results of pan-cancer panel pending.   The CancerNext-Expanded gene panel offered by [facility] and includes sequencing and rearrangement analysis for the following 77 genes: AIP, ALK, APC*, ATM*, AXIN2, BAP1, BARD1, BLM, BMPR1A, BRCA1*, BRCA2*, BRIP1*, CDC73, CDH1*, CDK4, CDKN1B, CDKN2A, CHEK2*, CTNNA1, DICER1, FANCC, FH, FLCN, GALNT12, KIF1B, LZTR1, MAX, MEN1, MET, MLH1*, MSH2*, MSH3, MSH6*, MUTYH*, NBN, NF1*, NF2, NTHL1, PALB2*, PHOX2B, PMS2*, POT1, PRKAR1A, PTCH1, PTEN*, RAD51C*, RAD51D*, RB1, RECQL, RET,  SDHA, SDHAF2, SDHB, SDHC, SDHD, SMAD4, SMARCA4, SMARCB1, SMARCE1, STK11, SUFU, TMEM127, TP53*, TSC1, TSC2, VHL and XRCC2 (sequencing and deletion/duplication); EGFR, EGLN1, HOXB13, KIT, MITF, PDGFRA, POLD1, and POLE (sequencing only); EPCAM and  GREM1 (deletion/duplication only). DNA and RNA analyses performed for * genes.    08/23/2021 Cancer Staging   Staging form: Breast, AJCC 8th Edition - Pathologic stage from 08/23/2021: Stage IA (pT2(2), pN0(sn), cM0, G1, ER+, PR+, HER2-) - Signed by Doris Baumgartner, MD on 09/29/2021 Histopathologic type: Lobular carcinoma, NOS Stage prefix: Initial diagnosis Method of lymph node assessment: Sentinel lymph node biopsy Nuclear grade: G1 Multigene prognostic tests performed: EndoPredict Histologic grading system: 3 grade system Residual tumor (R): R0 - None Laterality: Right Tumor size (mm): 24 Multiple tumors: Yes Number of tumors: 2 Lymph-vascular invasion (LVI): LVI not present (absent)/not identified Diagnostic confirmation: Positive histology PLUS positive immunophenotyping and/or positive genetic studies Specimen type: Excision Staged by: Managing physician Menopausal status: Postmenopausal Ki-67 (%): 5 Stage used in treatment planning: Yes National guidelines used in treatment planning: Yes Type of national guideline used in treatment planning: NCCN   Vulvar cancer (HCC)  06/28/2023 Initial Diagnosis   Vulvar cancer (HCC)   06/28/2023 Cancer Staging   Staging form: Vulva, AJCC V9 - Clinical stage from 06/28/2023: FIGO Stage IIIC (cT1b, cN1c, cM0) - Signed by Doris Baumgartner, MD on 07/31/2023 Histopathologic type: Squamous cell carcinoma, NOS Stage prefix: Initial diagnosis Method of lymph node assessment: Lymph node dissection Histologic grade (G): G2 Histologic grading system: 3 grade system Tumor size (mm): 30 Lymph-vascular invasion (LVI): LVI present/identified, NOS Diagnostic confirmation: Positive histology Specimen type: Excision Staged by: Managing physician Femoral-inguinal nodal status: Positive Solitary (s) or multifocal (m) tumors in the primary site: Solitary Perineural invasion (PNI): Unknown Stage used in treatment planning: Yes National guidelines used  in treatment planning: Yes Type of national guideline used in treatment planning: NCCN   08/29/2023 -  Chemotherapy   Patient is on Treatment Plan : CERVICAL Cisplatin (40) q7d + Pembrolizumab q21d + XRT     10/10/2023 -  Chemotherapy   Patient is on Treatment Plan : CERVICAL Pembrolizumab (200) q21d        INTERVAL HISTORY: Doris Reyes is here today for repeat clinical assessment prior to starting adjuvant therapy.  This was delayed last week due to cellulitis of the lower abdomen.  She states she noticed increased streaking of the redness of her lower abdomen last week before starting the antibiotic.  She feels the redness is improving at this time.  She still reports slight tan colored vaginal discharge.  She denies vulvar bleeding.  She reports intermittent headaches.  She also has occasional shortness of breath with exertion.  She denies cough or chest pain.  She states she remains fatigued ever since surgery.  She denies fevers or chills. She denies pain. Her appetite is good. Her weight has decreased 3 pounds over last week.  REVIEW OF SYSTEMS:  Review of Systems  Constitutional:  Positive for fatigue. Negative for appetite change, chills, fever and unexpected weight change.  HENT:   Negative for lump/mass, mouth sores and sore throat.   Respiratory:  Positive for shortness of breath (intermittent with exertion). Negative for cough and wheezing.   Cardiovascular:  Negative for chest pain and leg swelling.  Gastrointestinal:  Negative for abdominal pain, constipation, diarrhea, nausea and vomiting.  Genitourinary:  Positive for vaginal discharge. Negative for difficulty urinating, dysuria, frequency, hematuria and vaginal bleeding.   Musculoskeletal:  Negative for arthralgias,  back pain, gait problem and myalgias.  Skin:  Negative for rash.  Neurological:  Positive for headaches (intermittent). Negative for dizziness, extremity weakness, gait problem, light-headedness and numbness.   Hematological:  Negative for adenopathy. Does not bruise/bleed easily.  Psychiatric/Behavioral:  Negative for depression and sleep disturbance. The patient is not nervous/anxious.      VITALS:  Blood pressure 125/71, pulse (!) 58, temperature 98.1 F (36.7 C), temperature source Oral, resp. rate 20, height 5\' 3"  (1.6 m), weight 210 lb (95.3 kg), SpO2 96%. Wt Readings from Last 3 Encounters:  08/25/23 210 lb (95.3 kg)  08/18/23 213 lb 3.2 oz (96.7 kg)  08/08/23 212 lb 8 oz (96.4 kg)  Body mass index is 37.2 kg/m.  Performance status (ECOG): 1 - Symptomatic but completely ambulatory   PHYSICAL EXAM:  Physical Exam Vitals and nursing note reviewed.  Constitutional:      General: She is not in acute distress.    Appearance: Normal appearance.  HENT:     Head: Normocephalic and atraumatic.     Mouth/Throat:     Mouth: Mucous membranes are moist.     Pharynx: Oropharynx is clear. No oropharyngeal exudate or posterior oropharyngeal erythema.  Eyes:     General: No scleral icterus.    Extraocular Movements: Extraocular movements intact.     Conjunctiva/sclera: Conjunctivae normal.     Pupils: Pupils are equal, round, and reactive to light.  Cardiovascular:     Rate and Rhythm: Normal rate and regular rhythm.     Heart sounds: Normal heart sounds. No murmur heard.    No friction rub. No gallop.  Pulmonary:     Effort: Pulmonary effort is normal.     Breath sounds: Normal breath sounds. No wheezing, rhonchi or rales.  Abdominal:     General: There is no distension.     Palpations: Abdomen is soft. There is no mass.     Tenderness: There is no abdominal tenderness.  Genitourinary:    Comments: The vulvar incision is still not completely healed and she has creamy vaginal discharge. Musculoskeletal:        General: Normal range of motion.     Cervical back: Normal range of motion and neck supple. No tenderness.     Right lower leg: No edema.     Left lower leg: No edema.   Lymphadenopathy:     Cervical: No cervical adenopathy.  Skin:    General: Skin is warm and dry.     Coloration: Skin is not jaundiced.     Findings: Erythema present. No rash.     Comments: The erythema of the lower abdomen is improving.  There is quite Doris bit of subcutaneous edema.  There is new erythema under the pannus.  Neurological:     Mental Status: She is alert and oriented to person, place, and time.     Cranial Nerves: No cranial nerve deficit.  Psychiatric:        Mood and Affect: Mood normal.        Behavior: Behavior normal.        Thought Content: Thought content normal.   LABS:      Latest Ref Rng & Units 08/18/2023    2:03 PM 07/20/2023   11:01 AM 07/14/2023    1:35 PM  CBC  WBC 4.0 - 10.5 K/uL 5.6  4.3  7.9   Hemoglobin 12.0 - 15.0 g/dL 16.1  09.6  04.5   Hematocrit 36.0 - 46.0 % 37.8  36.6  38.2   Platelets 150 - 400 K/uL 224  227  309       Latest Ref Rng & Units 08/18/2023    2:03 PM 07/20/2023   11:01 AM 06/29/2023   11:01 AM  CMP  Glucose 70 - 99 mg/dL 161  096  045   BUN 8 - 23 mg/dL 19  16  18    Creatinine 0.44 - 1.00 mg/dL 4.09  8.11  9.14   Sodium 135 - 145 mmol/L 139  136  139   Potassium 3.5 - 5.1 mmol/L 3.9  4.7  3.8   Chloride 98 - 111 mmol/L 103  103  104   CO2 22 - 32 mmol/L 23  24  25    Calcium  8.9 - 10.3 mg/dL 78.2  9.7  8.8   Total Protein 6.5 - 8.1 g/dL 6.5  6.3    Total Bilirubin 0.0 - 1.2 mg/dL <9.5  0.2    Alkaline Phos 38 - 126 U/L 75  86    AST 15 - 41 U/L 18  16    ALT 0 - 44 U/L 16  13       STUDIES:  IR IMAGING GUIDED PORT INSERTION Result Date: 07/31/2023 CLINICAL DATA:  Vulvar carcinoma and need for porta cath for chemotherapy. EXAM: IMPLANTED PORT Doris CATH PLACEMENT WITH ULTRASOUND AND FLUOROSCOPIC GUIDANCE ANESTHESIA/SEDATION: Moderate (conscious) sedation was employed during this procedure. Doris total of Versed  2.0 mg and Fentanyl  100 mcg was administered intravenously. Moderate Sedation Time: 34 minutes. The patient's level of  consciousness and vital signs were monitored continuously by radiology nursing throughout the procedure under my direct supervision. FLUOROSCOPY: 36 seconds.  6.0 mGy. PROCEDURE: The procedure, risks, benefits, and alternatives were explained to the patient. Questions regarding the procedure were encouraged and answered. The patient understands and consents to the procedure. Doris time-out was performed prior to initiating the procedure. Ultrasound was utilized to confirm patency of the right internal jugular vein. An ultrasound image was saved in recorder. The right neck and chest were prepped with chlorhexidine  in Doris sterile fashion, and Doris sterile drape was applied covering the operative field. Maximum barrier sterile technique with sterile gowns and gloves were used for the procedure. Local anesthesia was provided with 1% lidocaine . After creating Doris small venotomy incision, Doris 21 gauge needle was advanced into the right internal jugular vein under direct, real-time ultrasound guidance. Ultrasound image documentation was performed. After securing guidewire access, an 8 Fr dilator was placed. Doris J-wire was kinked to measure appropriate catheter length. Doris subcutaneous port pocket was then created along the upper chest wall utilizing sharp and blunt dissection. Portable cautery was utilized. The pocket was irrigated with sterile saline. Doris single lumen power injectable port was chosen for placement. The 8 Fr catheter was tunneled from the port pocket site to the venotomy incision. The port was placed in the pocket. External catheter was trimmed to appropriate length based on guidewire measurement. At the venotomy, an 8 Fr peel-away sheath was placed over Doris guidewire. The catheter was then placed through the sheath and the sheath removed. Final catheter positioning was confirmed and documented with Doris fluoroscopic spot image. The port was accessed with Doris needle and aspirated and flushed with heparinized saline. The access  needle was removed. The venotomy and port pocket incisions were closed with subcutaneous 3-0 Monocryl and subcuticular 4-0 Vicryl. Dermabond was applied to both incisions. COMPLICATIONS: COMPLICATIONS None FINDINGS: After catheter placement, the tip lies at the cavo-atrial  junction. The catheter aspirates normally and is ready for immediate use. IMPRESSION: Placement of single lumen port Doris cath via right internal jugular vein. The catheter tip lies at the cavo-atrial junction. Doris power injectable port Doris cath was placed and is ready for immediate use. Electronically Signed   By: Erica Hau M.D.   On: 07/31/2023 10:57      HISTORY:   Past Medical History:  Diagnosis Date   Anemia    Anxiety    Cancer (HCC)    right breast ILC   Complication of anesthesia    difficulty waking up   Depression    Diverticulitis    Family history of breast cancer    Family history of ovarian cancer    Family history of prostate cancer    Fibromyalgia    GERD (gastroesophageal reflux disease)    Heart murmur    History of colonic polyps    History of hiatal hernia    Hyperlipidemia    Hypertension    IBS (irritable bowel syndrome)    Ischemic colitis (HCC) 03/2012   Osteoporosis    Pre-diabetes    RLS (restless legs syndrome)    Sinusitis    Sleep apnea    CPAP nightly    Past Surgical History:  Procedure Laterality Date   BREAST LUMPECTOMY WITH RADIOACTIVE SEED LOCALIZATION Right 08/04/2021   Procedure: RIGHT BREAST LUMPECTOMY WITH RADIOACTIVE SEED LOCALIZATION;  Surgeon: Sim Dryer, MD;  Location: [facility];  Service: General;  Laterality: Right;   CHOLECYSTECTOMY  2004   COLONOSCOPY WITH PROPOFOL  N/Doris 04/18/2017   Procedure: COLONOSCOPY WITH PROPOFOL ;  Surgeon: Tami Falcon, MD;  Location: [facility];  Service: Endoscopy;  Laterality: N/Doris;   ESOPHAGOGASTRODUODENOSCOPY (EGD) WITH PROPOFOL  N/Doris 04/18/2017   Procedure: ESOPHAGOGASTRODUODENOSCOPY (EGD) WITH PROPOFOL ;  Surgeon: Tami Falcon,  MD;  Location: [facility];  Service: Endoscopy;  Laterality: N/Doris;   EXAM UNDER ANESTHESIA, PELVIC N/Doris 06/28/2023   Procedure: EXAM UNDER ANESTHESIA, PELVIC;  Surgeon: Suzi Essex, MD;  Location: [facility];  Service: Gynecology;  Laterality: N/Doris;   FLEXIBLE SIGMOIDOSCOPY  04/20/2012   Procedure: FLEXIBLE SIGMOIDOSCOPY;  Surgeon: Almeda Aris, MD;  Location: [facility];  Service: Endoscopy;  Laterality: N/Doris;   INGUINAL LYMPHADENECTOMY N/Doris 06/28/2023   Procedure: LYMPHADENECTOMY, INGUINAL, OPEN;  Surgeon: Suzi Essex, MD;  Location: [facility];  Service: Gynecology;  Laterality: N/Doris;   IR IMAGING GUIDED PORT INSERTION  07/31/2023   NISSEN FUNDOPLICATION  2011   PARAESOPHAGEAL HERNIA REPAIR  09/11/2009   and Nissen fundoplication   RADICAL VULVECTOMY N/Doris 06/28/2023   Procedure: VULVECTOMY, RADICAL;  Surgeon: Suzi Essex, MD;  Location: [facility];  Service: Gynecology;  Laterality: N/Doris;  Possible skin flap   RE-EXCISION OF BREAST LUMPECTOMY Right 08/18/2021   Procedure: RE-EXCISION RIGHT BREAST LUMPECTOMY;  Surgeon: Sim Dryer, MD;  Location: [facility];  Service: General;  Laterality: Right;   SENTINEL NODE BIOPSY N/Doris 08/04/2021   Procedure: SENTINEL NODE BIOPSY;  Surgeon: Sim Dryer, MD;  Location: [facility];  Service: General;  Laterality: N/Doris;   TONSILLECTOMY  1960's   TOTAL ABDOMINAL HYSTERECTOMY  2001   w/ BSO    Family History  Problem Relation Age of Onset   Colon cancer Mother 84   Alzheimer's disease Mother    Heart attack Father    Cancer Maternal Aunt        x2   Alzheimer's disease Maternal Aunt    Ovarian cancer Maternal Aunt  Prostate cancer Maternal Uncle    Kidney disease Maternal Uncle    Atrial fibrillation Maternal Uncle    Stroke Paternal Uncle    Aneurysm Paternal Grandmother        brain   Stroke Paternal Grandfather    Prostate cancer Cousin        paternal first cousin   Prostate cancer Cousin        paternal first cousin    Esophageal cancer Cousin        maternal first cousin   Breast cancer Cousin        DCIS, maternal first cousin   Ovarian cancer Cousin        maternal first cousin   Rheum arthritis Other    Lung disease Neg Hx     Social History:  reports that she has never smoked. She has never used smokeless tobacco. She reports that she does not drink alcohol and does not use drugs.The patient is alone today.  Allergies:  Allergies  Allergen Reactions   Ceftin [Cefuroxime Axetil] Diarrhea   Cefuroxime Other (See Comments)    IBS   Other Other (See Comments)    Pneumonia vaccine (uncoded)   Sulfa  Antibiotics Nausea And Vomiting   Tramadol Hcl Nausea And Vomiting    Current Medications: Current Outpatient Medications  Medication Sig Dispense Refill   fluconazole  (DIFLUCAN ) 100 MG tablet Take 1 tablet (100 mg total) by mouth daily. 10 tablet 1   acetaminophen  (TYLENOL ) 650 MG CR tablet 1,300 mg in the morning and at bedtime.     ALPRAZolam  (XANAX ) 0.25 MG tablet Take 0.25 mg by mouth at bedtime as needed for anxiety.     amLODipine  (NORVASC ) 5 MG tablet Take 5 mg by mouth daily.     amoxicillin -clavulanate (AUGMENTIN ) 875-125 MG tablet Take 1 tablet by mouth 2 (two) times daily for 10 days. 20 tablet 0   Ascorbic Acid (VITAMIN C) 500 MG CHEW Chew 500 mg by mouth daily.     aspirin EC 81 MG tablet Take 81 mg by mouth at bedtime. Swallow whole.     atenolol  (TENORMIN ) 50 MG tablet Take 75 mg by mouth daily.      benazepril  (LOTENSIN ) 10 MG tablet Take 10 mg by mouth daily.     benzonatate (TESSALON) 100 MG capsule Take 100-200 mg by mouth at bedtime.     Biotin 1000 MCG tablet Take 1,000 mcg by mouth daily.     Calcium  Citrate-Vitamin D (CITRACAL + D PO) Take 1 tablet by mouth daily.     cetirizine (ZYRTEC) 10 MG tablet Take 10 mg by mouth at bedtime.     Cholecalciferol (VITAMIN D) 50 MCG (2000 UT) tablet Take 2,000 Units by mouth daily.     cyanocobalamin 1000 MCG tablet Take 1,000 mcg  by mouth daily.     diclofenac sodium (VOLTAREN) 1 % GEL Apply 1 application  topically daily as needed (for pain).     diphenhydrAMINE  (BENADRYL ) 25 MG tablet Take 25 mg by mouth every 6 (six) hours as needed for allergies.     diphenoxylate-atropine (LOMOTIL) 2.5-0.025 MG tablet Take 2 tablets by mouth 4 (four) times daily as needed for diarrhea or loose stools.     DULoxetine  (CYMBALTA ) 60 MG capsule Take 120 mg by mouth at bedtime.     EPINEPHrine  (EPIPEN  2-PAK) 0.3 mg/0.3 mL IJ SOAJ injection Inject 0.3 mg into the muscle as needed for anaphylaxis.     estradiol  (ESTRACE  VAGINAL) 0.1 MG/GM  vaginal cream Place 1 Applicatorful vaginally 3 (three) times Doris week. 42.5 g 12   famotidine  (PEPCID ) 20 MG tablet Take 20 mg by mouth at bedtime.     fexofenadine (ALLEGRA) 180 MG tablet Take 180 mg by mouth in the morning.     fluticasone  (FLONASE ) 50 MCG/ACT nasal spray Place 1 spray into both nostrils daily.     lidocaine  (XYLOCAINE ) 5 % ointment Apply 1 Application topically 2 (two) times daily as needed for moderate pain (pain score 4-6) (to the vulva). Apply to vulva as needed for discomfort 35.44 g 3   lidocaine -prilocaine  (EMLA ) cream Apply to affected area once 30 g 3   NON FORMULARY Pt uses Doris c-pap nightly     Nutritional Supplements (JUICE PLUS FIBRE PO) Take 2 each by mouth daily. Fruits and Vegetables     Omega-3 Fatty Acids (FISH OIL) 1000 MG CAPS Take 1,000 mg by mouth daily.      ondansetron  (ZOFRAN ) 4 MG tablet Take 4 mg by mouth every 8 (eight) hours as needed for nausea or vomiting.     ondansetron  (ZOFRAN ) 8 MG tablet Take 1 tablet (8 mg total) by mouth every 8hrs as needed for nausea or vomiting. Start on the third day after cisplatin. 30 tablet 1   pantoprazole  (PROTONIX ) 40 MG tablet Take 40 mg by mouth every evening.     Phenylephrine -APAP-Guaifenesin (TYLENOL  SINUS SEVERE PO) Take 2 tablets by mouth daily as needed (drainage).     polyvinyl alcohol (LIQUIFILM TEARS) 1.4 %  ophthalmic solution Place 1 drop into both eyes 5 (five) times daily.     Probiotic Product (PROBIOTIC & ACIDOPHILUS EX ST PO) Take 1 capsule by mouth daily. Ultra Flora Plus Capsules     prochlorperazine  (COMPAZINE ) 10 MG tablet Take 1 tablet (10 mg total) by mouth every 6 (six) hours as needed for nausea or vomiting. 30 tablet 1   Propylene Glycol (SYSTANE COMPLETE) 0.6 % SOLN Place 1 drop into both eyes in the morning and at bedtime.     rOPINIRole  (REQUIP ) 0.5 MG tablet Take 0.5 mg by mouth at bedtime.     senna (SENOKOT) 8.6 MG tablet 2 tablets at bedtime as needed Orally Once Doris day     senna-docusate (SENOKOT-S) 8.6-50 MG tablet Take 2 tablets by mouth at bedtime. For AFTER surgery, do not take if having diarrhea (Patient taking differently: Take 1 tablet by mouth at bedtime.) 30 tablet 0   simvastatin  (ZOCOR ) 10 MG tablet Take 10 mg by mouth at bedtime.       sucralfate  (CARAFATE ) 1 GM/10ML suspension Take 1 g by mouth 2 (two) times daily.     traZODone  (DESYREL ) 50 MG tablet Take 50 mg by mouth at bedtime.     triamcinolone  (KENALOG ) 0.025 % ointment Apply 1 Application topically 2 (two) times daily. 30 g 0   vitamin E 400 UNIT capsule Take 400 Units by mouth daily.       White Petrolatum -Mineral Oil (REFRESH P.M. OP) Place 1 Application into both eyes at bedtime.     No current facility-administered medications for this visit.

## 2023-08-24 ENCOUNTER — Ambulatory Visit: Admitting: Radiation Oncology

## 2023-08-24 ENCOUNTER — Other Ambulatory Visit: Payer: Self-pay

## 2023-08-25 ENCOUNTER — Encounter: Payer: Self-pay | Admitting: Oncology

## 2023-08-25 ENCOUNTER — Other Ambulatory Visit: Payer: Self-pay

## 2023-08-25 ENCOUNTER — Ambulatory Visit

## 2023-08-25 ENCOUNTER — Encounter: Payer: Self-pay | Admitting: Hematology and Oncology

## 2023-08-25 ENCOUNTER — Telehealth: Payer: Self-pay | Admitting: Hematology and Oncology

## 2023-08-25 ENCOUNTER — Other Ambulatory Visit

## 2023-08-25 ENCOUNTER — Telehealth: Payer: Self-pay

## 2023-08-25 ENCOUNTER — Inpatient Hospital Stay (HOSPITAL_BASED_OUTPATIENT_CLINIC_OR_DEPARTMENT_OTHER): Admitting: Hematology and Oncology

## 2023-08-25 ENCOUNTER — Ambulatory Visit: Admitting: Hematology and Oncology

## 2023-08-25 VITALS — BP 125/71 | HR 58 | Temp 98.1°F | Resp 20 | Ht 63.0 in | Wt 210.0 lb

## 2023-08-25 DIAGNOSIS — Z90710 Acquired absence of both cervix and uterus: Secondary | ICD-10-CM | POA: Diagnosis not present

## 2023-08-25 DIAGNOSIS — C519 Malignant neoplasm of vulva, unspecified: Secondary | ICD-10-CM

## 2023-08-25 DIAGNOSIS — Z90722 Acquired absence of ovaries, bilateral: Secondary | ICD-10-CM | POA: Diagnosis not present

## 2023-08-25 DIAGNOSIS — L03315 Cellulitis of perineum: Secondary | ICD-10-CM

## 2023-08-25 DIAGNOSIS — Z853 Personal history of malignant neoplasm of breast: Secondary | ICD-10-CM | POA: Diagnosis not present

## 2023-08-25 DIAGNOSIS — Z79899 Other long term (current) drug therapy: Secondary | ICD-10-CM | POA: Diagnosis not present

## 2023-08-25 MED ORDER — AMOXICILLIN-POT CLAVULANATE 875-125 MG PO TABS: 1.0000 | ORAL_TABLET | Freq: Two times a day (BID) | ORAL | 0 refills | Status: AC

## 2023-08-25 MED ORDER — FLUCONAZOLE 100 MG PO TABS: 100.0000 mg | ORAL_TABLET | Freq: Every day | ORAL | 1 refills | Status: DC

## 2023-08-25 NOTE — Telephone Encounter (Signed)
 Patient has been scheduled for follow-up visit per 08/25/23 LOS.  Pt noted appt details on personal planner/calendar.

## 2023-08-25 NOTE — Assessment & Plan Note (Addendum)
 She was treated with pelvic vulvectomy, bilateral debulking, radical lymphadenectomy, and inguinal dissection done June 28, 2023, with 3/4 positive nodes on the left side, up to 3 cm and with extensive extranodal tumor. 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 There was lymphovascular invasion and carcinoma in situ at the 6 o'clock margin with < 1 mm margin for invasive carcinoma. PET scan revealed metastatic left inguinal lymph nodes up to 1.8 cm in diameter, but no additional evidence of metastatic disease in the neck, chest, abdomen or pelvis. There was a 4 mm right lower lobe nodule, too small for PET resolution.   We plan concurrent chemoradiation with cisplatin. She will also have immunotherapy with pembrolizuab since her PDL 1 score is 85%, and continue pembrolizumab maintenance after the other is completed. Treatment was held last week due to new cellulitis lower abdomen. This is improving but her vulvar incision is not completely healed. She also had vulvar candidiasis and probable candidiasis of the skin under the pannus.  We will continue Augmentin another 10 days and place her on fluconazole for 10 days. We will plan to see her back in 1 week for re-evalauation prior to starting adjuvant chemoradiation.

## 2023-08-26 ENCOUNTER — Other Ambulatory Visit: Payer: Self-pay

## 2023-08-28 ENCOUNTER — Ambulatory Visit

## 2023-08-29 ENCOUNTER — Inpatient Hospital Stay: Admitting: Hematology and Oncology

## 2023-08-29 ENCOUNTER — Inpatient Hospital Stay

## 2023-08-29 ENCOUNTER — Ambulatory Visit

## 2023-08-29 NOTE — Progress Notes (Addendum)
 [facility]  [address] [place],  [place]  [address] [phone]  Clinic Day:09/01/23  Referring physician:  Arloa Fallow, MD   Assessment:  Invasive lobular carcinoma This was found on screening mammogram but the MRI reveals focal enhancement surrounding the biopsy clip up to 2.3 cm and there was non-mass enhancement posterior to this known malignancy on MRI.  This area was biopsied in order to guide her ultimate surgery.This is strongly ER/PR positive, HER2 negative and has a low Ki 67 of less than 5%.  She has now had a lumpectomy but had to go back for reexcision of the margins.  The sentinel lymph node was negative but she did have 2 primaries, measuring 2.4 cm and 2.2 cm, for a T2 N0 M0, stage IIA.  Even though she has several favorable characteristics, such as grade 1 histology and a low Ki 67, I still recommended that we pursue Endopredict testing to quantitate her risk for recurrence, and fortunately she has an EpClin score of 2.6, low risk.  This correlates with a 5.1% risk of distant recurrence in the next 10 years. Her benefit from chemotherapy would be 1.0% so we did not pursue. Her risk for a late recurrence is 4.0%. Unfortunately she has refused hormonal therapy.  Vulvar Carcinoma Stage IIIC She had a pelvic vulvectomy, bilateral debulking, radical lymphadenectomy, and inguinal dissection done on 06/28/2023, with 3/4 positive nodes on the left side, up to 3 cm and with extensive extranodal tumor. This was moderately differentiated squamous cell carcinoma measuring 3 cm with depth of invasion 5 mm and carcinoma in situ, for a T1b N2c M0. She had lymphovascular invasion and carcinoma in situ at the 6 o'clock margin with < 1 mm margin for invasive carcinoma. We will plan concurrent chemoradiation with cisplatin . She will also have immunotherapy with pembrolizuab since her PDL 1  score is 85%, and we will continue that as maintenance after the other is completed. PET scan revealed metastatic left inguinal lymph nodes up to 1.8 cm in diameter, but no additional evidence of metastatic disease in the neck, chest, abdomen or pelvis. There was a 4 mm right lower lobe nodule, too small for PET resolution.  We have recommended concurrent radiation, chemotherapy and immunotherapy but start of treatment has been delayed several times due to infection/cellulitis of the vulvar area.  We will finally proceed on June 9.  Strong family history Including multiple females with breast cancer, her mother had colon cancer at age 51, and another maternal aunt had ovarian cancer.  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  She has been counseled on the implications of this and is already getting regular colonoscopy.  However the information will also be passed on to family members to pursue testing.  Osteopenia Her spine shows a T score of -2.6 for osteoporosis and the right femur has osteopenia.  At the very least she should be taking calcium  and vitamin D. Bone density scan done on 07/17/2023 revealed osteopenia.  Cellulitis of the Genitalia This has improved but now is much more red and inflamed consistent with cellulitis. We gave her Augmentin  875mg  BID for 10 days and postponed the start of her treatment by 3 weeks now.    Plan: PET scan revealed metastatic left inguinal lymph nodes up to 1.8 cm in diameter, but no additional evidence of metastatic disease in the neck, chest, abdomen or pelvis. There was a 4 mm right  lower lobe nodule, too small for PET resolution.  Patient had a pelvic vulvectomy, bilateral debulking, radical lymphadenectomy, and inguinal dissection done on 06/28/2023.  Pathology revealed 3/4 positive nodes on the left side, up to 3 cm and with extensive extranodal tumor, and negative nodes on the right side. The vulvar carcinoma  measured 3 cm with depth of invasion 5 mm and carcinoma in situ, for a T1b N2c M0. She had lymphovascular invasion and carcinoma in situ at the 6 o'clock margin with < 1 mm margin for invasive carcinoma. We had her scheduled to start chemoradiation next week with weekly Cisplatin  but have had to postpone this by 3 weeks now due to persistent cellulitis of the area which is finally clearing.  She also has some yeast infection in the area and I will prescribe Mycostatin  powder.  She has slight serous drainage but the erythema, swelling and pain has improved.  I think she is ready to start treatment and we will proceed on June 9 with concurrent chemoradiation and immunotherapy.  We will see her back 1 weekly with CBC, CMP, magnesium , TSH, and T4. I discussed the assessment and treatment plan with the patient.  She was provided an opportunity to ask questions and all were answered.  The patient was advised to call back if she has further questions.   I provided 30 minutes of face-to-face time during this this encounter and > 50% was spent counseling as documented under my assessment and plan.   Doris VEAR Cornish, MD  [facility] OF [facility] [address] [place] [address] Dept: [phone] Dept Fax: [phone]   Orders Placed This Encounter  Procedures   Hemoglobin A1c    Standing Status:   Future    Number of Occurrences:   1    Expiration Date:   08/31/2024    CHIEF COMPLAINTS/PURPOSE OF CONSULTATION:  Breast cancer, stage IIA  HISTORY OF PRESENTING ILLNESS:  Doris Reyes 73 y.o. 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  She does have annual mammograms because of her strong family history, but was on hormone replacement therapy for 10 years.  She had a screening mammogram at [facility] on May 05, 2021 which revealed architectural  distortion which was felt to be indeterminant.  She had a diagnostic mammogram on March 3 which showed mild nipple retraction and a 1 cm area of focal asymmetry in the retroareolar region.  Ultrasound revealed a hypoechoic irregular mass measuring 9 mm in that area.  On March 14 she had a biopsy performed which revealed a 0.9 cm irregular mass with spiculated margins in the right breast at 12:00, approximately 2 cm from the nipple and anteriorly in the breast.  This was found to be grade 2 invasive mammary carcinoma as well as mammary carcinoma in situ.  The E-cadherin immunohistochemistry stains were negative and so this is consistent with invasive lobular carcinoma.  The estrogen receptors were positive at 95% and progesterone receptors positive at 95% with HER2 by immunohistochemistry positive at 2+ but FISH was negative.  Ki-67 was less than 5%.  The carcinoma in situ was positive for E-cadherin, consistent with ductal carcinoma in situ.  She had an MRI of the breast and was found to have focal enhancement in the right breast surrounding the biopsy clip and some non-mass enhancement posterior to the malignancy.  It  was recommended that she have an MRI guided biopsy of the clumped non-mass enhancement area and this revealed invasive lobular carcinoma grade 1 with lobular carcinoma in situ and extensive fibrocystic changes with focal intraluminal microcalcification and intraductal papillomatosis.  Due to her strong family history including multiple females with breast cancer in her mother with colon cancer at age 83, she did have genetic testing with [facility] and was found to have a carrier mutation of MUTYH.  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  It was recommended that she have colonoscopy beginning at age 26 or 10 years prior to the age of her first-degree use relatives age at  colorectal cancer diagnosis.  It was recommended this be repeated every 5 years.  This was explained to her.  She has now had a lumpectomy but had to go back for reexcision of the margins.  The sentinel lymph node was negative but she did have 2 primaries, measuring 2.4 cm and 2.2 cm, for a T2 N0 M0, stage IIA. This was ER/PR positive, HER 2 negative and a Ki-67 was 5%.  Even though she has several favorable characteristics, such as grade 1 histology and a low Ki 67, I still recommended that we pursue Endopredict testing to quantitate her risk for recurrence, and fortunately she has an EpClin score of 2.6, low risk. This correlates with a 5.1% risk of distant recurrence in the next 10 years, with only a 1.0% benefit of chemotherapy. Her risk of late recurrence is 4.0%. I recommended hormonal therapy but she refused.  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 PET scan revealed metastatic left inguinal lymph nodes up to 1.8 cm in diameter, but no additional evidence of metastatic disease in the neck, chest, abdomen or pelvis. There was a 4 mm right lower lobe nodule, too small for PET resolution.  Patient had a pelvic vulvectomy, bilateral debulking, radical lymphadenectomy, and inguinal dissection done on 06/28/2023.  Pathology revealed 3/4 positive nodes on the left side, up to 3 cm and with extensive extranodal tumor, and negative nodes on the right side. The vulvar carcinoma measured 3 cm with depth of invasion 5 mm and carcinoma in situ, for a T1b N2c M0. She had lymphovascular invasion and carcinoma in situ at the 6 o'clock margin with < 1 mm margin for invasive carcinoma.Diagnostic bilateral mammogram done on 05/04/2023 was clear. Bone density scan done on 07/17/2023 revealed osteopenia.  I reviewed her records extensively and collaborated the history with the patient.  SUMMARY OF ONCOLOGIC HISTORY: Oncology History  Cancer of central portion  of right breast (HCC)  06/08/2021 Initial Diagnosis   Breast cancer in female [facility])   07/23/2021 Genetic Testing   Negative hereditary cancer genetic testing: no pathogenic variants detected in [facility] Panel. Report date is July 23, 2021.  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  The report date is Jul 26, 2021.  The BRCAplus panel offered by [employer] and includes sequencing and deletion/duplication analysis for the following 8 genes: ATM, BRCA1, BRCA2, CDH1, CHEK2, PALB2, PTEN, and TP53.  Results of pan-cancer panel pending.   The CancerNext-Expanded gene panel offered by [employer] and includes sequencing and rearrangement analysis for the following 77 genes: AIP,  ALK, APC*, ATM*, AXIN2, BAP1, BARD1, BLM, BMPR1A, BRCA1*, BRCA2*, BRIP1*, CDC73, CDH1*, CDK4, CDKN1B, CDKN2A, CHEK2*, CTNNA1, DICER1, FANCC, FH, FLCN, GALNT12, KIF1B, LZTR1, MAX, MEN1, MET, MLH1*, MSH2*, MSH3, MSH6*, MUTYH*, NBN, NF1*, NF2, NTHL1, PALB2*, PHOX2B, PMS2*, POT1, PRKAR1A, PTCH1, PTEN*, RAD51C*, RAD51D*, RB1, RECQL, RET, SDHA, SDHAF2, SDHB, SDHC, SDHD, SMAD4, SMARCA4, SMARCB1, SMARCE1, STK11, SUFU, TMEM127, TP53*, TSC1, TSC2, VHL and XRCC2 (sequencing and deletion/duplication); EGFR, EGLN1, HOXB13, KIT, MITF, PDGFRA, POLD1, and POLE (sequencing only); EPCAM and GREM1 (deletion/duplication only). DNA and RNA analyses performed for * genes.    08/23/2021 Cancer Staging   Staging form: Breast, AJCC 8th Edition - Pathologic stage from 08/23/2021: Stage IA (pT2(2), pN0(sn), cM0, G1, ER+, PR+, HER2-) - Signed by Cornelius Doris DEL, MD on 09/29/2021 Histopathologic type: Lobular carcinoma, NOS Stage prefix: Initial diagnosis Method of lymph node assessment: Sentinel lymph node biopsy Nuclear grade: G1 Multigene prognostic tests performed: EndoPredict Histologic grading system: 3  grade system Residual tumor (R): R0 - None Laterality: Right Tumor size (mm): 24 Multiple tumors: Yes Number of tumors: 2 Lymph-vascular invasion (LVI): LVI not present (absent)/not identified Diagnostic confirmation: Positive histology PLUS positive immunophenotyping and/or positive genetic studies Specimen type: Excision Staged by: Managing physician Menopausal status: Postmenopausal Ki-67 (%): 5 Stage used in treatment planning: Yes National guidelines used in treatment planning: Yes Type of national guideline used in treatment planning: NCCN   Vulvar cancer (HCC)  06/28/2023 Initial Diagnosis   Vulvar cancer (HCC)   06/28/2023 Cancer Staging   Staging form: Vulva, AJCC V9 - Clinical stage from 06/28/2023: FIGO Stage IIIC (cT1b, cN1c, cM0) - Signed by Cornelius Doris DEL, MD on 07/31/2023 Histopathologic type: Squamous cell carcinoma, NOS Stage prefix: Initial diagnosis Method of lymph node assessment: Lymph node dissection Histologic grade (G): G2 Histologic grading system: 3 grade system Tumor size (mm): 30 Lymph-vascular invasion (LVI): LVI present/identified, NOS Diagnostic confirmation: Positive histology Specimen type: Excision Staged by: Managing physician Femoral-inguinal nodal status: Positive Solitary (s) or multifocal (m) tumors in the primary site: Solitary Perineural invasion (PNI): Unknown Stage used in treatment planning: Yes National guidelines used in treatment planning: Yes Type of national guideline used in treatment planning: NCCN   09/04/2023 -  Chemotherapy   Patient is on Treatment Plan : CERVICAL Cisplatin  (40) q7d + Pembrolizumab  q21d + XRT     10/16/2023 -  Chemotherapy   Patient is on Treatment Plan : CERVICAL Pembrolizumab  (200) q21d     In terms of breast cancer risk profile:  She menarched at early age of 51-13 and went to surgical menopause at age 20 She had 1 pregnancy, her first child was born at age 58 She was exposed hormone replacement  therapy for 10 years in the form of a patch.  She has significant positive family history of Breast and ovarian and colon cancer  INTERVAL HISTORY: Doris Reyes is here today for a follow up for her vulvar carcinoma. She also had a breast cancer stage IIA, diagnosed in March of 2023. 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 PET scan revealed metastatic left inguinal lymph nodes up to 1.8 cm in diameter, but no additional evidence of metastatic disease in the neck, chest, abdomen or pelvis. There was a 4 mm right lower lobe nodule, too small for PET resolution.  Patient had a pelvic vulvectomy, bilateral debulking, radical lymphadenectomy, and inguinal  dissection done on 06/28/2023.  Pathology revealed 3/4 positive nodes on the left side, up to 3 cm and with extensive extranodal tumor, and negative nodes on the right side. The vulvar carcinoma measured 3 cm with depth of invasion 5 mm and carcinoma in situ, for a T1b N2c M0. She had lymphovascular invasion and carcinoma in situ at the 6 o'clock margin with < 1 mm margin for invasive carcinoma. We had her scheduled to start chemoradiation next week with weekly Cisplatin  but have had to postpone this by 3 weeks now due to persistent cellulitis of the area which is finally clearing.  She also has some yeast infection in the area and I will prescribe Mycostatin  powder.  She has slight serous drainage but the erythema, swelling and pain has improved.  I think she is ready to start treatment and we will proceed on June 9 with concurrent chemoradiation and immunotherapy. She complains of some back pain at a 3 out of 10.  I will plan weekly follow-up with CBC, CMP, magnesium  level, and TSH.  She denies fever, chills, night sweats, or other signs of infection. She denies cardiorespiratory and gastrointestinal issues. She   denies pain. Her appetite is good and Her weight has been stable.    MEDICAL HISTORY:  Past Medical History:  Diagnosis Date   Anemia    Anxiety    Cancer (HCC)    right breast ILC   Complication of anesthesia    difficulty waking up   Depression    Diverticulitis    Family history of breast cancer    Family history of ovarian cancer    Family history of prostate cancer    Fibromyalgia    GERD (gastroesophageal reflux disease)    Heart murmur    History of colonic polyps    History of hiatal hernia    Hyperlipidemia    Hypertension    IBS (irritable bowel syndrome)    Ischemic colitis (HCC) 03/2012   Osteoporosis    Pre-diabetes    RLS (restless legs syndrome)    Sinusitis    Sleep apnea    CPAP nightly  Vitamin D deficiency Iron deficiency treated with IV iron infusions in 2019  SURGICAL HISTORY: Past Surgical History:  Procedure Laterality Date   BREAST LUMPECTOMY WITH RADIOACTIVE SEED LOCALIZATION Right 08/04/2021   Procedure: RIGHT BREAST LUMPECTOMY WITH RADIOACTIVE SEED LOCALIZATION;  Surgeon: Vanderbilt Ned, MD;  Location: [facility];  Service: General;  Laterality: Right;   CHOLECYSTECTOMY  2004   COLONOSCOPY WITH PROPOFOL  N/A 04/18/2017   Procedure: COLONOSCOPY WITH PROPOFOL ;  Surgeon: Kristie Lamprey, MD;  Location: [facility];  Service: Endoscopy;  Laterality: N/A;   ESOPHAGOGASTRODUODENOSCOPY (EGD) WITH PROPOFOL  N/A 04/18/2017   Procedure: ESOPHAGOGASTRODUODENOSCOPY (EGD) WITH PROPOFOL ;  Surgeon: Kristie Lamprey, MD;  Location: [facility];  Service: Endoscopy;  Laterality: N/A;   EXAM UNDER ANESTHESIA, PELVIC N/A 06/28/2023   Procedure: EXAM UNDER ANESTHESIA, PELVIC;  Surgeon: Viktoria Comer SAUNDERS, MD;  Location: [facility];  Service: Gynecology;  Laterality: N/A;   FLEXIBLE SIGMOIDOSCOPY  04/20/2012   Procedure: FLEXIBLE SIGMOIDOSCOPY;  Surgeon: Belvie JONETTA Just, MD;  Location: [facility];  Service: Endoscopy;  Laterality: N/A;   INGUINAL LYMPHADENECTOMY N/A 06/28/2023    Procedure: LYMPHADENECTOMY, INGUINAL, OPEN;  Surgeon: Viktoria Comer SAUNDERS, MD;  Location: [facility];  Service: Gynecology;  Laterality: N/A;   IR IMAGING GUIDED PORT INSERTION  07/31/2023   NISSEN FUNDOPLICATION  2011   PARAESOPHAGEAL HERNIA REPAIR  09/11/2009   and Nissen  fundoplication   RADICAL VULVECTOMY N/A 06/28/2023   Procedure: VULVECTOMY, RADICAL;  Surgeon: Viktoria Comer SAUNDERS, MD;  Location: [facility];  Service: Gynecology;  Laterality: N/A;  Possible skin flap   RE-EXCISION OF BREAST LUMPECTOMY Right 08/18/2021   Procedure: RE-EXCISION RIGHT BREAST LUMPECTOMY;  Surgeon: Vanderbilt Ned, MD;  Location: [facility];  Service: General;  Laterality: Right;   SENTINEL NODE BIOPSY N/A 08/04/2021   Procedure: SENTINEL NODE BIOPSY;  Surgeon: Vanderbilt Ned, MD;  Location: [facility];  Service: General;  Laterality: N/A;   TONSILLECTOMY  1960's   TOTAL ABDOMINAL HYSTERECTOMY  2001   w/ BSO  She had 2 large benign tumors of the ovaries removed with bilateral salpingo oophorectomy  SOCIAL HISTORY: Social History   Socioeconomic History   Marital status: Widowed    Spouse name: Lynwood   Number of children: 1   Years of education: Not on file   Highest education level: Some college, no degree  Occupational History   Occupation: disabled  Tobacco Use   Smoking status: Never   Smokeless tobacco: Never   Tobacco comments:    brief exposure through her husband  Vaping Use   Vaping status: Never Used  Substance and Sexual Activity   Alcohol use: Never    Alcohol/week: 0.0 standard drinks of alcohol   Drug use: Never   Sexual activity: Not Currently  Other Topics Concern   Not on file  Social History Narrative   From [place] originally. Previously lived in [place]. Previously worked at [employer] also in a [employer]. Has also worked as a Diplomatic Services operational officer for [employer]. No pets currently. No bird exposure. No known mold in her current home.    Lives with husband   Caffeine-  coffee 2 c daily   Social Drivers of Health   Financial Resource Strain: Low Risk  (02/01/2022)   Overall Financial Resource Strain (CARDIA)    Difficulty of Paying Living Expenses: Not hard at all  Food Insecurity: No Food Insecurity (06/28/2023)   Hunger Vital Sign    Worried About Running Out of Food in the Last Year: Never true    Ran Out of Food in the Last Year: Never true  Transportation Needs: No Transportation Needs (06/28/2023)   PRAPARE - Administrator, Civil Service (Medical): No    Lack of Transportation (Non-Medical): No  Physical Activity: Not on file  Stress: Not on file  Social Connections: Moderately Isolated (06/28/2023)   Social Connection and Isolation Panel    Frequency of Communication with Friends and Family: More than three times a week    Frequency of Social Gatherings with Friends and Family: Twice a week    Attends Religious Services: More than 4 times per year    Active Member of [employer] or Organizations: No    Attends Banker Meetings: Never    Marital Status: Widowed  Intimate Partner Violence: Not At Risk (06/28/2023)   Humiliation, Afraid, Rape, and Kick questionnaire    Fear of Current or Ex-Partner: No    Emotionally Abused: No    Physically Abused: No    Sexually Abused: No   The patient is here with her husband Alverna and daughter Hospital doctor today.  She has never smoked and does not drink alcohol or use drugs. FAMILY HISTORY: Family History  Problem Relation Age of Onset   Colon cancer Mother 34   Alzheimer's disease Mother    Heart attack Father    Cancer Maternal  Aunt        x2   Alzheimer's disease Maternal Aunt    Ovarian cancer Maternal Aunt    Prostate cancer Maternal Uncle    Kidney disease Maternal Uncle    Atrial fibrillation Maternal Uncle    Stroke Paternal Uncle    Aneurysm Paternal Grandmother        brain   Stroke Paternal Grandfather    Prostate cancer Cousin        paternal first cousin   Prostate cancer  Cousin        paternal first cousin   Esophageal cancer Cousin        maternal first cousin   Breast cancer Cousin        DCIS, maternal first cousin   Ovarian cancer Cousin        maternal first cousin   Rheum arthritis Other    Lung disease Neg Hx   Ovarian cancer                                               maternal aunt                         82's Breast cancer                                                 cousin                                     33's Prostate cancer                                              maternal uncle                        49's  ALLERGIES:  is allergic to ceftin [cefuroxime axetil], cefuroxime, other, sulfa  antibiotics, tramadol hcl, and zofran  [ondansetron ].  MEDICATIONS:  Current Outpatient Medications  Medication Sig Dispense Refill   LORazepam  (ATIVAN ) 1 MG tablet Take 1 tablet (1 mg total) by mouth every 8 (eight) hours. 30 tablet 0   acetaminophen  (TYLENOL ) 650 MG CR tablet 1,300 mg in the morning and at bedtime.     ALPRAZolam  (XANAX ) 0.25 MG tablet Take 0.25 mg by mouth at bedtime as needed for anxiety. (Patient not taking: Reported on 09/18/2023)     amLODipine  (NORVASC ) 5 MG tablet Take 5 mg by mouth daily.     Ascorbic Acid (VITAMIN C) 500 MG CHEW Chew 500 mg by mouth daily.     aspirin EC 81 MG tablet Take 81 mg by mouth at bedtime. Swallow whole.     atenolol  (TENORMIN ) 50 MG tablet Take 75 mg by mouth daily.      benazepril  (LOTENSIN ) 10 MG tablet Take 10 mg by mouth daily.     benzonatate (TESSALON) 100 MG capsule Take 100-200 mg by mouth at bedtime.     Biotin 1000 MCG tablet Take 1,000 mcg by mouth daily.  Calcium  Citrate-Vitamin D (CITRACAL + D PO) Take 1 tablet by mouth daily.     cetirizine (ZYRTEC) 10 MG tablet Take 10 mg by mouth at bedtime.     Cholecalciferol (VITAMIN D) 50 MCG (2000 UT) tablet Take 2,000 Units by mouth daily.     cyanocobalamin 1000 MCG tablet Take 1,000 mcg by mouth daily.     diclofenac sodium  (VOLTAREN) 1 % GEL Apply 1 application  topically daily as needed (for pain).     diphenhydrAMINE  (BENADRYL ) 25 MG tablet Take 25 mg by mouth every 6 (six) hours as needed for allergies.     diphenoxylate-atropine (LOMOTIL) 2.5-0.025 MG tablet Take 2 tablets by mouth 4 (four) times daily as needed for diarrhea or loose stools.     DULoxetine  (CYMBALTA ) 60 MG capsule Take 120 mg by mouth at bedtime.     EPINEPHrine  (EPIPEN  2-PAK) 0.3 mg/0.3 mL IJ SOAJ injection Inject 0.3 mg into the muscle as needed for anaphylaxis. (Patient not taking: Reported on 09/11/2023)     estradiol  (ESTRACE  VAGINAL) 0.1 MG/GM vaginal cream Place 1 Applicatorful vaginally 3 (three) times a week. (Patient not taking: Reported on 09/18/2023) 42.5 g 12   famotidine  (PEPCID ) 20 MG tablet Take 20 mg by mouth at bedtime.     fexofenadine (ALLEGRA) 180 MG tablet Take 180 mg by mouth in the morning.     fluticasone  (FLONASE ) 50 MCG/ACT nasal spray Place 1 spray into both nostrils daily.     lidocaine  (XYLOCAINE ) 5 % ointment Apply 1 Application topically 2 (two) times daily as needed for moderate pain (pain score 4-6) (to the vulva). Apply to vulva as needed for discomfort 35.44 g 3   lidocaine -prilocaine  (EMLA ) cream Apply to affected area once 30 g 3   loratadine  (CLARITIN ) 10 MG tablet Take 10 mg by mouth daily. (Patient not taking: Reported on 09/18/2023)     NON FORMULARY Pt uses a c-pap nightly     Nutritional Supplements (JUICE PLUS FIBRE PO) Take 2 each by mouth daily. Fruits and Vegetables     nystatin  (MYCOSTATIN /NYSTOP ) powder Apply 1 Application topically 3 (three) times daily. 30 g 5   Omega-3 Fatty Acids (FISH OIL) 1000 MG CAPS Take 1,000 mg by mouth daily.      pantoprazole  (PROTONIX ) 40 MG tablet Take 40 mg by mouth every evening.     Phenylephrine -APAP-Guaifenesin (TYLENOL  SINUS SEVERE PO) Take 2 tablets by mouth daily as needed (drainage).     polyvinyl alcohol (LIQUIFILM TEARS) 1.4 % ophthalmic solution Place 1 drop  into both eyes 5 (five) times daily.     Probiotic Product (PROBIOTIC & ACIDOPHILUS EX ST PO) Take 1 capsule by mouth daily. Ultra Flora Plus Capsules     prochlorperazine  (COMPAZINE ) 10 MG tablet Take 1 tablet (10 mg total) by mouth every 6 (six) hours as needed for nausea or vomiting. 30 tablet 1   Propylene Glycol (SYSTANE COMPLETE) 0.6 % SOLN Place 1 drop into both eyes in the morning and at bedtime.     rOPINIRole  (REQUIP ) 0.5 MG tablet Take 0.5 mg by mouth at bedtime.     senna (SENOKOT) 8.6 MG tablet 2 tablets at bedtime as needed Orally Once a day     senna-docusate (SENOKOT-S) 8.6-50 MG tablet Take 2 tablets by mouth at bedtime. For AFTER surgery, do not take if having diarrhea 30 tablet 0   simvastatin  (ZOCOR ) 10 MG tablet Take 10 mg by mouth at bedtime.       sucralfate  (  CARAFATE ) 1 GM/10ML suspension Take 1 g by mouth 2 (two) times daily.     traZODone  (DESYREL ) 50 MG tablet Take 50 mg by mouth at bedtime.     triamcinolone  (KENALOG ) 0.025 % ointment Apply 1 Application topically 2 (two) times daily. 30 g 0   vitamin E 400 UNIT capsule Take 400 Units by mouth daily.       White Petrolatum -Mineral Oil (REFRESH P.M. OP) Place 1 Application into both eyes at bedtime.     No current facility-administered medications for this visit.   REVIEW OF SYSTEMS:   Review of Systems  Constitutional: Negative.  Negative for appetite change, chills, diaphoresis, fatigue, fever and unexpected weight change.  HENT:  Negative.  Negative for hearing loss, lump/mass, mouth sores, nosebleeds, sore throat, tinnitus, trouble swallowing and voice change.   Eyes: Negative.  Negative for eye problems and icterus.  Respiratory: Negative.  Negative for chest tightness, cough, hemoptysis, shortness of breath and wheezing.   Cardiovascular: Negative.  Negative for chest pain, leg swelling and palpitations.  Gastrointestinal: Negative.  Negative for abdominal distention, abdominal pain, blood in stool,  constipation, diarrhea, nausea, rectal pain and vomiting.  Endocrine: Negative.   Genitourinary: Negative.  Negative for bladder incontinence, difficulty urinating, dyspareunia, dysuria, frequency, hematuria, menstrual problem, nocturia, pelvic pain, vaginal bleeding and vaginal discharge.        Pain of her vulva and bilateral groin area rating a 2/10 after taking tylenol .  Musculoskeletal:  Positive for arthralgias (left foot) and back pain (lower back/flank, 3/10). Negative for flank pain, gait problem, myalgias, neck pain and neck stiffness.  Skin: Negative.  Negative for itching, rash and wound.  Neurological:  Negative for dizziness, extremity weakness, gait problem, headaches, light-headedness, numbness, seizures and speech difficulty.  Hematological: Negative.  Negative for adenopathy. Does not bruise/bleed easily.  Psychiatric/Behavioral: Negative.  Negative for confusion, decreased concentration, depression, sleep disturbance and suicidal ideas. The patient is not nervous/anxious.    PHYSICAL EXAMINATION: ECOG PERFORMANCE STATUS: 1 - Symptomatic but completely ambulatory  Physical Exam Vitals and nursing note reviewed.  Constitutional:      General: She is not in acute distress.    Appearance: Normal appearance. She is normal weight. She is not ill-appearing, toxic-appearing or diaphoretic.  HENT:     Head: Normocephalic and atraumatic.     Right Ear: Tympanic membrane, ear canal and external ear normal. There is no impacted cerumen.     Left Ear: Tympanic membrane, ear canal and external ear normal. There is no impacted cerumen.     Nose: Nose normal. No congestion or rhinorrhea.     Mouth/Throat:     Mouth: Mucous membranes are moist.     Pharynx: Oropharynx is clear. No oropharyngeal exudate or posterior oropharyngeal erythema.   Eyes:     General: No scleral icterus.       Right eye: No discharge.        Left eye: No discharge.     Extraocular Movements: Extraocular  movements intact.     Conjunctiva/sclera: Conjunctivae normal.     Pupils: Pupils are equal, round, and reactive to light.   Neck:     Vascular: No carotid bruit.   Cardiovascular:     Rate and Rhythm: Normal rate and regular rhythm.     Pulses: Normal pulses.     Heart sounds: Normal heart sounds. No murmur heard.    No friction rub. No gallop.  Pulmonary:     Effort: Pulmonary effort is normal.  No respiratory distress.     Breath sounds: Normal breath sounds. No stridor. No wheezing, rhonchi or rales.  Chest:     Chest wall: No tenderness.     Comments: No masses in either breast Well healed right axillary scar Abdominal:     General: Bowel sounds are normal. There is no distension.     Palpations: Abdomen is soft. There is no hepatomegaly, splenomegaly or mass.     Tenderness: There is no abdominal tenderness. There is no right CVA tenderness, left CVA tenderness, guarding or rebound.     Hernia: No hernia is present.  Genitourinary:    Comments: Her groin incisions are still healing well with no drainage and the erythema is nearly resolved    Musculoskeletal:        General: No swelling, deformity or signs of injury. Normal range of motion.     Right shoulder: No tenderness. Normal range of motion.     Cervical back: Normal range of motion and neck supple. No rigidity or tenderness.     Right lower leg: No edema.     Left lower leg: No edema.  Lymphadenopathy:     Cervical: No cervical adenopathy.     Right cervical: No superficial, deep or posterior cervical adenopathy.    Left cervical: No superficial, deep or posterior cervical adenopathy.     Upper Body:     Right upper body: No supraclavicular, axillary or pectoral adenopathy.     Left upper body: No supraclavicular, axillary or pectoral adenopathy.   Skin:    General: Skin is warm and dry.     Coloration: Skin is not jaundiced or pale.     Findings: No bruising, erythema, lesion or rash.   Neurological:      General: No focal deficit present.     Mental Status: She is alert and oriented to person, place, and time. Mental status is at baseline.     Cranial Nerves: No cranial nerve deficit.     Sensory: No sensory deficit.     Motor: No weakness.     Coordination: Coordination normal.     Gait: Gait normal.     Deep Tendon Reflexes: Reflexes normal.   Psychiatric:        Mood and Affect: Mood normal.        Behavior: Behavior normal.        Thought Content: Thought content normal.        Judgment: Judgment normal.   LABORATORY DATA:  I have reviewed the data as listed Lab Results  Component Value Date   WBC 3.3 (L) 09/18/2023   HGB 10.1 (L) 09/18/2023   HCT 30.7 (L) 09/18/2023   MCV 84.8 09/18/2023   PLT 121 (L) 09/18/2023    Lab Results  Component Value Date   CREATININE 0.99 09/18/2023   BUN 17 09/18/2023   NA 138 09/18/2023   K 4.1 09/18/2023   CL 104 09/18/2023   CO2 24 09/18/2023      Component Value Date/Time   PROT 6.0 (L) 09/18/2023 0815   ALBUMIN 4.1 09/18/2023 0815   AST 21 09/18/2023 0815   ALT 21 09/18/2023 0815   ALKPHOS 67 09/18/2023 0815   BILITOT 0.2 09/18/2023 0815   Lab Results  Component Value Date   TSH 0.771 08/18/2023   T4TOTAL 8.2 08/18/2023   PATHOLOGY:   SURGICAL PATHOLOGY CASE: [id number] PATIENT: Doris Reyes Surgical Pathology Report  Clinical History: Vulvar cancer (jlr)  FINAL MICROSCOPIC DIAGNOSIS:  A. 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 Largest metastasis is 3 cm. Extensive extranodal tumor.  B. LYMPH NODE, RIGHT INGUINAL, RESECTION: Two lymph nodes negative for metastatic carcinoma (0/2).  C. 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 Greatest depth of invasion is 5 mm. Carcinoma in situ focally involves the 6:00 margin. Invasive carcinoma focally less than 1 mm from 6:00  margin. Lymphovascular space involvement by carcinoma. See oncology table and comment.  D. LATERAL DEEP MARGIN: Benign adipose tissue and connective tissue. Negative for carcinoma. Final lateral deep margin negative for carcinoma.  ONCOLOGY TABLE: VULVA, CARCINOMA: Resection Procedure: Pelvic vulvectomy and inguinal lymphadenectomy Tumor Focality: Unifocal Tumor Site: Left vulva Tumor Size: 3 x 2.8 cm Histologic Type: Squamous cell carcinoma Histologic Grade: Moderately differentiated Depth of Invasion:      Specify depth of invasion (mm): 5 mm Other Tissue/ Organ Involvement: Not applicable Lymphovascular Invasion: Present Margins:      Margins Involved by Invasive Carcinoma: All margins negative for invasive carcinoma      Invasive carcinoma focally less than 1 mm from 6:00 margin      Margin Status for HSIL or dVIN: 6:00 margin focally positive for carcinoma in situ Regional Lymph Nodes           Lymph Nodes Examined:                                   0 Sentinel                                   6 non-sentinel                                   6 total           Number of Nodes with Metastasis 5 mm or Greater: 2           Number of Nodes with Metastasis Less than 5 mm (excludes isolated tumor cells): 1           Number of Nodes with Isolated Tumor Cells (0.2 mm or less): 0           Additional Lymph Node Findings: Extensive extranodal extension Distant Metastasis:      Distant Site(s) Involved: Not applicable Pathologic Stage Classification (pTNM, AJCC 8th Edition): pT1b (IB), pN2c (IIIC) Ancillary Studies: Can be performed if requested Representative Tumor Block: C3-C5 Comment(s): The tumor is moderately differentiated invasive squamous cell carcinoma which is 3 cm in greatest dimension and shows 5 mm in greatest stromal invasion.  There is associated carcinoma in situ which focally involves the 6:00 margin and invasive carcinoma is focally less than 1 mm from the  6:00 margin.  Invasive carcinoma is also focally 2 mm from the medial dermal margin in the midportion of the specimen. (v4.2.0.2)    RADIOGRAPHIC STUDIES: No results found.  EXAM: 07/17/2023 DUAL X-RAY ABSORPTIOMETRY (DXA) FOR BONE MINERAL DENSITY LUMBAR SPINE (L2-L4): BMD (in g/cm2): 0.912 T-score: -2.4 Z-score: -0.7   LEFT FEMORAL NECK: BMD (in g/cm2): 0.793 T-score: -1.8 Z-score: 0.0   LEFT TOTAL HIP: BMD (in g/cm2): 0.922 T-score: -0.7 Z-score: 0.9   RIGHT FEMORAL NECK: BMD (  in g/cm2): 0.855 T-score: -1.3 Z-score: 0.5   RIGHT TOTAL HIP: BMD (in g/cm2): 0.896 T-score: -0.9 Z-score: 0.7    EXAM: 06/05/2023 NUCLEAR MEDICINE PET SKULL BASE TO THIGH IMPRESSION: 1. Metastatic left inguinal lymph nodes. No additional evidence of metastatic disease in the neck, chest, abdomen or pelvis. 2. 4 mm right lower lobe nodule, too small for PET resolution. Recommend attention on follow-up. 3. Small to moderate hiatal hernia. 4.  Aortic atherosclerosis (ICD10-I70.0).     I,Jasmine M Lassiter,acting as a scribe for Doris VEAR Cornish, MD.,have documented all relevant documentation on the behalf of Doris VEAR Cornish, MD,as directed by  Doris VEAR Cornish, MD while in the presence of Doris VEAR Cornish, MD.

## 2023-08-30 ENCOUNTER — Telehealth: Payer: Self-pay

## 2023-08-30 ENCOUNTER — Ambulatory Visit

## 2023-08-30 NOTE — Telephone Encounter (Signed)
-----   Message from Nurse Shelvy Dickens E sent at 08/25/2023  4:03 PM EDT ----- Regarding: FW: Radiation start Holding off on Radiation for another week per Kelli and Dr. Almer Jacobson.  Shelvy Dickens, LPN ----- Message ----- From: Shelbie Dess, RN Sent: 08/24/2023  12:21 PM EDT To: Earma Gloss, LPN; Alfonso Ike, PA-C; # Subject: Radiation start                                You are seeing Ms. Chesney on Friday 08/25/2023.  We are wanting to start radiation on Monday 08/28/2023.  Let me know if that is okay after you examine her.

## 2023-08-31 ENCOUNTER — Encounter: Payer: Self-pay | Admitting: Oncology

## 2023-08-31 ENCOUNTER — Ambulatory Visit

## 2023-08-31 ENCOUNTER — Other Ambulatory Visit: Payer: Self-pay | Admitting: Pharmacist

## 2023-08-31 DIAGNOSIS — I10 Essential (primary) hypertension: Secondary | ICD-10-CM | POA: Diagnosis not present

## 2023-08-31 DIAGNOSIS — M797 Fibromyalgia: Secondary | ICD-10-CM | POA: Diagnosis not present

## 2023-08-31 DIAGNOSIS — K219 Gastro-esophageal reflux disease without esophagitis: Secondary | ICD-10-CM | POA: Diagnosis not present

## 2023-08-31 DIAGNOSIS — R7303 Prediabetes: Secondary | ICD-10-CM | POA: Diagnosis not present

## 2023-08-31 DIAGNOSIS — F5101 Primary insomnia: Secondary | ICD-10-CM | POA: Diagnosis not present

## 2023-08-31 DIAGNOSIS — Z0000 Encounter for general adult medical examination without abnormal findings: Secondary | ICD-10-CM | POA: Diagnosis not present

## 2023-08-31 DIAGNOSIS — G259 Extrapyramidal and movement disorder, unspecified: Secondary | ICD-10-CM | POA: Diagnosis not present

## 2023-08-31 DIAGNOSIS — E7800 Pure hypercholesterolemia, unspecified: Secondary | ICD-10-CM | POA: Diagnosis not present

## 2023-08-31 DIAGNOSIS — F419 Anxiety disorder, unspecified: Secondary | ICD-10-CM | POA: Diagnosis not present

## 2023-08-31 DIAGNOSIS — R053 Chronic cough: Secondary | ICD-10-CM | POA: Diagnosis not present

## 2023-08-31 DIAGNOSIS — M545 Low back pain, unspecified: Secondary | ICD-10-CM | POA: Diagnosis not present

## 2023-08-31 DIAGNOSIS — J309 Allergic rhinitis, unspecified: Secondary | ICD-10-CM | POA: Diagnosis not present

## 2023-09-01 ENCOUNTER — Other Ambulatory Visit: Payer: Self-pay | Admitting: Oncology

## 2023-09-01 ENCOUNTER — Encounter: Payer: Self-pay | Admitting: Oncology

## 2023-09-01 ENCOUNTER — Inpatient Hospital Stay (HOSPITAL_BASED_OUTPATIENT_CLINIC_OR_DEPARTMENT_OTHER): Admitting: Oncology

## 2023-09-01 ENCOUNTER — Other Ambulatory Visit: Payer: Self-pay

## 2023-09-01 ENCOUNTER — Inpatient Hospital Stay

## 2023-09-01 ENCOUNTER — Ambulatory Visit

## 2023-09-01 VITALS — BP 136/75 | HR 67 | Temp 97.8°F | Resp 16 | Ht 63.0 in | Wt 212.7 lb

## 2023-09-01 DIAGNOSIS — R7303 Prediabetes: Secondary | ICD-10-CM

## 2023-09-01 DIAGNOSIS — C519 Malignant neoplasm of vulva, unspecified: Secondary | ICD-10-CM | POA: Insufficient documentation

## 2023-09-01 DIAGNOSIS — Z853 Personal history of malignant neoplasm of breast: Secondary | ICD-10-CM | POA: Insufficient documentation

## 2023-09-01 DIAGNOSIS — Z923 Personal history of irradiation: Secondary | ICD-10-CM | POA: Insufficient documentation

## 2023-09-01 DIAGNOSIS — C50111 Malignant neoplasm of central portion of right female breast: Secondary | ICD-10-CM

## 2023-09-01 DIAGNOSIS — Z79899 Other long term (current) drug therapy: Secondary | ICD-10-CM | POA: Diagnosis not present

## 2023-09-01 DIAGNOSIS — C774 Secondary and unspecified malignant neoplasm of inguinal and lower limb lymph nodes: Secondary | ICD-10-CM | POA: Diagnosis not present

## 2023-09-01 DIAGNOSIS — D61818 Other pancytopenia: Secondary | ICD-10-CM | POA: Insufficient documentation

## 2023-09-01 DIAGNOSIS — Z170 Estrogen receptor positive status [ER+]: Secondary | ICD-10-CM | POA: Insufficient documentation

## 2023-09-01 DIAGNOSIS — Z5111 Encounter for antineoplastic chemotherapy: Secondary | ICD-10-CM | POA: Insufficient documentation

## 2023-09-01 DIAGNOSIS — R911 Solitary pulmonary nodule: Secondary | ICD-10-CM | POA: Insufficient documentation

## 2023-09-01 DIAGNOSIS — Z510 Encounter for antineoplastic radiation therapy: Secondary | ICD-10-CM | POA: Diagnosis not present

## 2023-09-01 DIAGNOSIS — C50811 Malignant neoplasm of overlapping sites of right female breast: Secondary | ICD-10-CM | POA: Insufficient documentation

## 2023-09-01 LAB — CMP (CANCER CENTER ONLY)
ALT: 17 U/L (ref 0–44)
AST: 17 U/L (ref 15–41)
Albumin: 4.4 g/dL (ref 3.5–5.0)
Alkaline Phosphatase: 74 U/L (ref 38–126)
Anion gap: 11 (ref 5–15)
BUN: 17 mg/dL (ref 8–23)
CO2: 23 mmol/L (ref 22–32)
Calcium: 9.8 mg/dL (ref 8.9–10.3)
Chloride: 105 mmol/L (ref 98–111)
Creatinine: 0.98 mg/dL (ref 0.44–1.00)
GFR, Estimated: >60 mL/min (ref >60)
Glucose, Bld: 115 mg/dL — ABNORMAL HIGH (ref 70–99)
Potassium: 4 mmol/L (ref 3.5–5.1)
Sodium: 139 mmol/L (ref 135–145)
Total Bilirubin: <0.2 mg/dL (ref 0.0–1.2)
Total Protein: 6.5 g/dL (ref 6.5–8.1)

## 2023-09-01 LAB — CBC WITH DIFFERENTIAL (CANCER CENTER ONLY)
Abs Immature Granulocytes: 0.01 10*3/uL (ref 0.00–0.07)
Basophils Absolute: 0 10*3/uL (ref 0.0–0.1)
Basophils Relative: 1 %
Eosinophils Absolute: 0.2 10*3/uL (ref 0.0–0.5)
Eosinophils Relative: 4 %
HCT: 37 % (ref 36.0–46.0)
Hemoglobin: 12.2 g/dL (ref 12.0–15.0)
Immature Granulocytes: 0 %
Lymphocytes Relative: 29 %
Lymphs Abs: 1.5 10*3/uL (ref 0.7–4.0)
MCH: 27.7 pg (ref 26.0–34.0)
MCHC: 33 g/dL (ref 30.0–36.0)
MCV: 84.1 fL (ref 80.0–100.0)
Monocytes Absolute: 0.6 10*3/uL (ref 0.1–1.0)
Monocytes Relative: 11 %
Neutro Abs: 2.9 10*3/uL (ref 1.7–7.7)
Neutrophils Relative %: 55 %
Platelet Count: 211 10*3/uL (ref 150–400)
RBC: 4.4 MIL/uL (ref 3.87–5.11)
RDW: 13.2 % (ref 11.5–15.5)
WBC Count: 5.3 10*3/uL (ref 4.0–10.5)
nRBC: 0 % (ref 0.0–0.2)

## 2023-09-01 MED ORDER — NYSTATIN 100000 UNIT/GM EX POWD: 1.0000 | Freq: Three times a day (TID) | CUTANEOUS | 5 refills | Status: DC

## 2023-09-04 ENCOUNTER — Other Ambulatory Visit: Payer: Self-pay

## 2023-09-04 ENCOUNTER — Encounter: Payer: Self-pay | Admitting: Oncology

## 2023-09-04 ENCOUNTER — Ambulatory Visit
Admission: RE | Admit: 2023-09-04 | Discharge: 2023-09-04 | Disposition: A | Discharge status: Home / Self Care | Source: Ambulatory Visit | Attending: Radiation Oncology | Admitting: Radiation Oncology

## 2023-09-04 ENCOUNTER — Ambulatory Visit

## 2023-09-04 ENCOUNTER — Inpatient Hospital Stay

## 2023-09-04 VITALS — BP 133/81 | HR 68 | Temp 97.9°F | Resp 20 | Ht 63.0 in | Wt 213.0 lb

## 2023-09-04 DIAGNOSIS — Z79899 Other long term (current) drug therapy: Secondary | ICD-10-CM | POA: Diagnosis not present

## 2023-09-04 DIAGNOSIS — C519 Malignant neoplasm of vulva, unspecified: Secondary | ICD-10-CM | POA: Insufficient documentation

## 2023-09-04 DIAGNOSIS — Z853 Personal history of malignant neoplasm of breast: Secondary | ICD-10-CM | POA: Insufficient documentation

## 2023-09-04 DIAGNOSIS — C50111 Malignant neoplasm of central portion of right female breast: Secondary | ICD-10-CM | POA: Diagnosis not present

## 2023-09-04 DIAGNOSIS — C774 Secondary and unspecified malignant neoplasm of inguinal and lower limb lymph nodes: Secondary | ICD-10-CM | POA: Diagnosis not present

## 2023-09-04 DIAGNOSIS — Z510 Encounter for antineoplastic radiation therapy: Secondary | ICD-10-CM | POA: Insufficient documentation

## 2023-09-04 LAB — RAD ONC ARIA SESSION SUMMARY
Course Elapsed Days: 0
Plan Fractions Treated to Date: 1
Plan Prescribed Dose Per Fraction: 1.8 Gy
Plan Total Fractions Prescribed: 28
Plan Total Prescribed Dose: 50.4 Gy
Reference Point Dosage Given to Date: 1.8 Gy
Reference Point Session Dosage Given: 1.8 Gy
Session Number: 1

## 2023-09-04 LAB — HEMOGLOBIN A1C
Hgb A1c MFr Bld: 6.2 % — ABNORMAL HIGH (ref 4.8–5.6)
Mean Plasma Glucose: 131 mg/dL

## 2023-09-04 MED ORDER — APREPITANT 130 MG/18ML IV EMUL
130.0000 mg | Freq: Once | INTRAVENOUS | Status: AC
  Administered 2023-09-04: 130 mg via INTRAVENOUS
  Filled 2023-09-04: qty 18

## 2023-09-04 MED ORDER — POTASSIUM CHLORIDE IN NACL 20-0.9 MEQ/L-% IV SOLN
Freq: Once | INTRAVENOUS | Status: AC
  Filled 2023-09-04: qty 1000

## 2023-09-04 MED ORDER — SODIUM CHLORIDE 0.9 % IV SOLN: INTRAVENOUS | Status: DC

## 2023-09-04 MED ORDER — MAGNESIUM SULFATE 2 GM/50ML IV SOLN
2.0000 g | Freq: Once | INTRAVENOUS | Status: AC
  Administered 2023-09-04: 2 g via INTRAVENOUS
  Filled 2023-09-04: qty 50

## 2023-09-04 MED ORDER — SODIUM CHLORIDE 0.9% FLUSH
10.0000 mL | INTRAVENOUS | Status: DC | PRN
  Administered 2023-09-04: 10 mL

## 2023-09-04 MED ORDER — DEXAMETHASONE SODIUM PHOSPHATE 10 MG/ML IJ SOLN
10.0000 mg | Freq: Once | INTRAMUSCULAR | Status: AC
  Administered 2023-09-04: 10 mg via INTRAVENOUS
  Filled 2023-09-04: qty 1

## 2023-09-04 MED ORDER — SODIUM CHLORIDE 0.9 % IV SOLN
200.0000 mg | Freq: Once | INTRAVENOUS | Status: AC
  Administered 2023-09-04: 200 mg via INTRAVENOUS
  Filled 2023-09-04: qty 8

## 2023-09-04 MED ORDER — HEPARIN SOD (PORK) LOCK FLUSH 100 UNIT/ML IV SOLN
500.0000 [IU] | Freq: Once | INTRAVENOUS | Status: AC | PRN
  Administered 2023-09-04: 500 [IU]

## 2023-09-04 MED ORDER — PALONOSETRON HCL INJECTION 0.25 MG/5ML
0.2500 mg | Freq: Once | INTRAVENOUS | Status: AC
  Administered 2023-09-04: 0.25 mg via INTRAVENOUS
  Filled 2023-09-04: qty 5

## 2023-09-04 MED ORDER — SODIUM CHLORIDE 0.9 % IV SOLN
40.0000 mg/m2 | Freq: Once | INTRAVENOUS | Status: AC
  Administered 2023-09-04: 83 mg via INTRAVENOUS
  Filled 2023-09-04: qty 83

## 2023-09-04 NOTE — Progress Notes (Signed)
..  Pharmacist Chemotherapy Monitoring - Initial Assessment    Anticipated start date: 09/04/23  The following has been reviewed per standard work regarding the patient's treatment regimen: The patient's diagnosis, treatment plan and drug doses, and organ/hematologic function Lab orders and baseline tests specific to treatment regimen  The treatment plan start date, drug sequencing, and pre-medications Prior authorization status  Patient's documented medication list, including drug-drug interaction screen and prescriptions for anti-emetics and supportive care specific to the treatment regimen The drug concentrations, fluid compatibility, administration routes, and timing of the medications to be used The patient's access for treatment and lifetime cumulative dose history, if applicable  The patient's medication allergies and previous infusion related reactions, if applicable   Changes made to treatment plan:  N/A  Follow up needed:  N/A   Deretha Fleck, [facility], 09/04/2023  8:47 AM

## 2023-09-04 NOTE — Patient Instructions (Signed)
 Cisplatin Injection What is this medication? CISPLATIN (SIS pla tin) treats some types of cancer. It works by slowing down the growth of cancer cells. This medicine may be used for other purposes; ask your health care provider or pharmacist if you have questions. COMMON BRAND NAME(S): Platinol, Platinol -AQ What should I tell my care team before I take this medication? They need to know if you have any of these conditions: Eye disease, vision problems Hearing problems Kidney disease Low blood counts, such as low white cells, platelets, or red blood cells Tingling of the fingers or toes, or other nerve disorder An unusual or allergic reaction to cisplatin, carboplatin, oxaliplatin, other medications, foods, dyes, or preservatives If you or your partner are pregnant or trying to get pregnant Breast-feeding How should I use this medication? This medication is injected into a vein. It is given by your care team in a hospital or clinic setting. Talk to your care team about the use of this medication in children. Special care may be needed. Overdosage: If you think you have taken too much of this medicine contact a poison control center or emergency room at once. NOTE: This medicine is only for you. Do not share this medicine with others. What if I miss a dose? Keep appointments for follow-up doses. It is important not to miss your dose. Call your care team if you are unable to keep an appointment. What may interact with this medication? Do not take this medication with any of the following: Live virus vaccines This medication may also interact with the following: Certain antibiotics, such as amikacin, gentamicin, neomycin, polymyxin B, streptomycin, tobramycin, vancomycin  Foscarnet This list may not describe all possible interactions. Give your health care provider a list of all the medicines, herbs, non-prescription drugs, or dietary supplements you use. Also tell them if you smoke, drink  alcohol, or use illegal drugs. Some items may interact with your medicine. What should I watch for while using this medication? Your condition will be monitored carefully while you are receiving this medication. You may need blood work done while taking this medication. This medication may make you feel generally unwell. This is not uncommon, as chemotherapy can affect healthy cells as well as cancer cells. Report any side effects. Continue your course of treatment even though you feel ill unless your care team tells you to stop. This medication may increase your risk of getting an infection. Call your care team for advice if you get a fever, chills, sore throat, or other symptoms of a cold or flu. Do not treat yourself. Try to avoid being around people who are sick. Avoid taking medications that contain aspirin, acetaminophen , ibuprofen , naproxen, or ketoprofen unless instructed by your care team. These medications may hide a fever. This medication may increase your risk to bruise or bleed. Call your care team if you notice any unusual bleeding. Be careful brushing or flossing your teeth or using a toothpick because you may get an infection or bleed more easily. If you have any dental work done, tell your dentist you are receiving this medication. Drink fluids as directed while you are taking this medication. This will help protect your kidneys. Call your care team if you get diarrhea. Do not treat yourself. Talk to your care team if you or your partner wish to become pregnant or think you might be pregnant. This medication can cause serious birth defects if taken during pregnancy and for 14 months after the last dose. A negative pregnancy  test is required before starting this medication. A reliable form of contraception is recommended while taking this medication and for 14 months after the last dose. Talk to your care team about effective forms of contraception. Do not father a child while taking this  medication and for 11 months after the last dose. Use a condom during sex during this time period. Do not breast-feed while taking this medication. This medication may cause infertility. Talk to your care team if you are concerned about your fertility. What side effects may I notice from receiving this medication? Side effects that you should report to your care team as soon as possible: Allergic reactions--skin rash, itching, hives, swelling of the face, lips, tongue, or throat Eye pain, change in vision, vision loss Hearing loss, ringing in ears Infection--fever, chills, cough, sore throat, wounds that don't heal, pain or trouble when passing urine, general feeling of discomfort or being unwell Kidney injury--decrease in the amount of urine, swelling of the ankles, hands, or feet Low red blood cell level--unusual weakness or fatigue, dizziness, headache, trouble breathing Painful swelling, warmth, or redness of the skin, blisters or sores at the infusion site Pain, tingling, or numbness in the hands or feet Unusual bruising or bleeding Side effects that usually do not require medical attention (report to your care team if they continue or are bothersome): Hair loss Nausea Vomiting This list may not describe all possible side effects. Call your doctor for medical advice about side effects. You may report side effects to FDA at 1-800-FDA-1088. Where should I keep my medication? This medication is given in a hospital or clinic. It will not be stored at home. NOTE: This sheet is a summary. It may not cover all possible information. If you have questions about this medicine, talk to your doctor, pharmacist, or health care provider.  2024 [employer] Standard (2021-07-16 00:00:00)Pembrolizumab Injection What is this medication? PEMBROLIZUMAB (PEM broe LIZ ue mab) treats some types of cancer. It works by helping your immune system slow or stop the spread of cancer cells. It is a monoclonal  antibody. This medicine may be used for other purposes; ask your health care provider or pharmacist if you have questions. COMMON BRAND NAME(S): Keytruda What should I tell my care team before I take this medication? They need to know if you have any of these conditions: Allogeneic stem cell transplant (uses someone else's stem cells) Autoimmune diseases, such as Crohn disease, ulcerative colitis, lupus History of chest radiation Nervous system problems, such as Guillain-Barre syndrome, myasthenia gravis Organ transplant An unusual or allergic reaction to pembrolizumab, other medications, foods, dyes, or preservatives Pregnant or trying to get pregnant Breast-feeding How should I use this medication? This medication is injected into a vein. It is given by your care team in a hospital or clinic setting. A special MedGuide will be given to you before each treatment. Be sure to read this information carefully each time. Talk to your care team about the use of this medication in children. While it may be prescribed for children as young as 6 months for selected conditions, precautions do apply. Overdosage: If you think you have taken too much of this medicine contact a poison control center or emergency room at once. NOTE: This medicine is only for you. Do not share this medicine with others. What if I miss a dose? Keep appointments for follow-up doses. It is important not to miss your dose. Call your care team if you are unable to keep an appointment.  What may interact with this medication? Interactions have not been studied. This list may not describe all possible interactions. Give your health care provider a list of all the medicines, herbs, non-prescription drugs, or dietary supplements you use. Also tell them if you smoke, drink alcohol, or use illegal drugs. Some items may interact with your medicine. What should I watch for while using this medication? Your condition will be monitored  carefully while you are receiving this medication. You may need blood work while taking this medication. This medication may cause serious skin reactions. They can happen weeks to months after starting the medication. Contact your care team right away if you notice fevers or flu-like symptoms with a rash. The rash may be red or purple and then turn into blisters or peeling of the skin. You may also notice a red rash with swelling of the face, lips, or lymph nodes in your neck or under your arms. Tell your care team right away if you have any change in your eyesight. Talk to your care team if you may be pregnant. Serious birth defects can occur if you take this medication during pregnancy and for 4 months after the last dose. You will need a negative pregnancy test before starting this medication. Contraception is recommended while taking this medication and for 4 months after the last dose. Your care team can help you find the option that works for you. Do not breastfeed while taking this medication and for 4 months after the last dose. What side effects may I notice from receiving this medication? Side effects that you should report to your care team as soon as possible: Allergic reactions--skin rash, itching, hives, swelling of the face, lips, tongue, or throat Dry cough, shortness of breath or trouble breathing Eye pain, redness, irritation, or discharge with blurry or decreased vision Heart muscle inflammation--unusual weakness or fatigue, shortness of breath, chest pain, fast or irregular heartbeat, dizziness, swelling of the ankles, feet, or hands Hormone gland problems--headache, sensitivity to light, unusual weakness or fatigue, dizziness, fast or irregular heartbeat, increased sensitivity to cold or heat, excessive sweating, constipation, hair loss, increased thirst or amount of urine, tremors or shaking, irritability Infusion reactions--chest pain, shortness of breath or trouble breathing,  feeling faint or lightheaded Kidney injury (glomerulonephritis)--decrease in the amount of urine, red or dark brown urine, foamy or bubbly urine, swelling of the ankles, hands, or feet Liver injury--right upper belly pain, loss of appetite, nausea, light-colored stool, dark yellow or brown urine, yellowing skin or eyes, unusual weakness or fatigue Pain, tingling, or numbness in the hands or feet, muscle weakness, change in vision, confusion or trouble speaking, loss of balance or coordination, trouble walking, seizures Rash, fever, and swollen lymph nodes Redness, blistering, peeling, or loosening of the skin, including inside the mouth Sudden or severe stomach pain, bloody diarrhea, fever, nausea, vomiting Side effects that usually do not require medical attention (report to your care team if they continue or are bothersome): Bone, joint, or muscle pain Diarrhea Fatigue Loss of appetite Nausea Skin rash This list may not describe all possible side effects. Call your doctor for medical advice about side effects. You may report side effects to FDA at 1-800-FDA-1088. Where should I keep my medication? This medication is given in a hospital or clinic. It will not be stored at home. NOTE: This sheet is a summary. It may not cover all possible information. If you have questions about this medicine, talk to your doctor, pharmacist, or health care  provider.  2024 [employer]/Gold Standard (2021-07-27 00:00:00)

## 2023-09-04 NOTE — Progress Notes (Signed)
 Pt voided of clear yellow urine at 1110

## 2023-09-05 ENCOUNTER — Ambulatory Visit

## 2023-09-05 ENCOUNTER — Ambulatory Visit
Admission: RE | Admit: 2023-09-05 | Discharge: 2023-09-05 | Disposition: A | Discharge status: Home / Self Care | Source: Ambulatory Visit | Attending: Radiation Oncology | Admitting: Radiation Oncology

## 2023-09-05 ENCOUNTER — Telehealth: Payer: Self-pay

## 2023-09-05 ENCOUNTER — Ambulatory Visit: Admitting: Oncology

## 2023-09-05 ENCOUNTER — Ambulatory Visit
Admission: RE | Admit: 2023-09-05 | Discharge: 2023-09-05 | Disposition: A | Discharge status: Home / Self Care | Source: Ambulatory Visit | Attending: Radiation Oncology

## 2023-09-05 ENCOUNTER — Other Ambulatory Visit

## 2023-09-05 ENCOUNTER — Other Ambulatory Visit: Payer: Self-pay

## 2023-09-05 DIAGNOSIS — C519 Malignant neoplasm of vulva, unspecified: Secondary | ICD-10-CM | POA: Diagnosis not present

## 2023-09-05 DIAGNOSIS — Z79899 Other long term (current) drug therapy: Secondary | ICD-10-CM | POA: Diagnosis not present

## 2023-09-05 DIAGNOSIS — C50111 Malignant neoplasm of central portion of right female breast: Secondary | ICD-10-CM | POA: Diagnosis not present

## 2023-09-05 DIAGNOSIS — C774 Secondary and unspecified malignant neoplasm of inguinal and lower limb lymph nodes: Secondary | ICD-10-CM | POA: Diagnosis not present

## 2023-09-05 DIAGNOSIS — Z853 Personal history of malignant neoplasm of breast: Secondary | ICD-10-CM | POA: Diagnosis not present

## 2023-09-05 DIAGNOSIS — Z510 Encounter for antineoplastic radiation therapy: Secondary | ICD-10-CM | POA: Diagnosis not present

## 2023-09-05 LAB — RAD ONC ARIA SESSION SUMMARY
Course Elapsed Days: 1
Plan Fractions Treated to Date: 2
Plan Prescribed Dose Per Fraction: 1.8 Gy
Plan Total Fractions Prescribed: 28
Plan Total Prescribed Dose: 50.4 Gy
Reference Point Dosage Given to Date: 3.6 Gy
Reference Point Session Dosage Given: 1.8 Gy
Session Number: 2

## 2023-09-05 NOTE — Telephone Encounter (Signed)
 I called to check on pt after her first infusion of Cisplatin and Keytruda yesterday. Pt states, "I didn't feel any different than I do everyday. I slept great. I did good yesterday. After sitting there so long, I came home then went outside and picked my blueberries". She denies N/V, skin rash, itching, cough, SOB, fever, and chills. We discussed some possible side effects she may notice in next few days/weeks -- mouth sores, numbness/tingling in hands or feet, muscle/joint pain, and changes in hearing (Cisplatin). She will let us  know if she experiences any of them. Pt understands to call us  if she develops temp of 100.4 or higher, day or night.

## 2023-09-06 ENCOUNTER — Other Ambulatory Visit: Payer: Self-pay

## 2023-09-06 ENCOUNTER — Ambulatory Visit
Admission: RE | Admit: 2023-09-06 | Discharge: 2023-09-06 | Disposition: A | Discharge status: Home / Self Care | Source: Ambulatory Visit | Attending: Radiation Oncology | Admitting: Radiation Oncology

## 2023-09-06 ENCOUNTER — Ambulatory Visit

## 2023-09-06 ENCOUNTER — Encounter: Payer: Self-pay | Admitting: Oncology

## 2023-09-06 DIAGNOSIS — C519 Malignant neoplasm of vulva, unspecified: Secondary | ICD-10-CM | POA: Diagnosis not present

## 2023-09-06 DIAGNOSIS — Z510 Encounter for antineoplastic radiation therapy: Secondary | ICD-10-CM | POA: Diagnosis not present

## 2023-09-06 DIAGNOSIS — Z79899 Other long term (current) drug therapy: Secondary | ICD-10-CM | POA: Diagnosis not present

## 2023-09-06 DIAGNOSIS — Z853 Personal history of malignant neoplasm of breast: Secondary | ICD-10-CM | POA: Diagnosis not present

## 2023-09-06 DIAGNOSIS — C774 Secondary and unspecified malignant neoplasm of inguinal and lower limb lymph nodes: Secondary | ICD-10-CM | POA: Diagnosis not present

## 2023-09-06 DIAGNOSIS — C50111 Malignant neoplasm of central portion of right female breast: Secondary | ICD-10-CM | POA: Diagnosis not present

## 2023-09-06 LAB — RAD ONC ARIA SESSION SUMMARY
Course Elapsed Days: 2
Plan Fractions Treated to Date: 3
Plan Prescribed Dose Per Fraction: 1.8 Gy
Plan Total Fractions Prescribed: 28
Plan Total Prescribed Dose: 50.4 Gy
Reference Point Dosage Given to Date: 5.4 Gy
Reference Point Session Dosage Given: 1.8 Gy
Session Number: 3

## 2023-09-06 NOTE — Progress Notes (Signed)
 VS:  wt 218.6lb  T 97.8, BP 124/64, P 55, O2 100%, R18.  Standing BP 130/60, P57  After arriving home after radiation treatment yesterday patient felt dizziness, little unsteady, and extremities felt heavy.  She ate lunch and sat on couch where is fell asleep for over an hours.  When she woke she states her near vision was altered. (This resolved)   She was up until close to midnight were she fell asleep on couch again and woke about 1:30.  She had one bout of nausea which she took medication and felt better.  Today she still feels better but remains foggy headed, vision and dizziness are better.   She will see me again tomorrow after radiation to evaluate how she is feeling.     Baldomero Bone NP aware.

## 2023-09-07 ENCOUNTER — Ambulatory Visit

## 2023-09-07 ENCOUNTER — Ambulatory Visit
Admission: RE | Admit: 2023-09-07 | Discharge: 2023-09-07 | Disposition: A | Discharge status: Home / Self Care | Source: Ambulatory Visit | Attending: Radiation Oncology | Admitting: Radiation Oncology

## 2023-09-07 ENCOUNTER — Other Ambulatory Visit: Payer: Self-pay

## 2023-09-07 DIAGNOSIS — C774 Secondary and unspecified malignant neoplasm of inguinal and lower limb lymph nodes: Secondary | ICD-10-CM | POA: Diagnosis not present

## 2023-09-07 DIAGNOSIS — C519 Malignant neoplasm of vulva, unspecified: Secondary | ICD-10-CM | POA: Diagnosis not present

## 2023-09-07 DIAGNOSIS — Z79899 Other long term (current) drug therapy: Secondary | ICD-10-CM | POA: Diagnosis not present

## 2023-09-07 DIAGNOSIS — Z510 Encounter for antineoplastic radiation therapy: Secondary | ICD-10-CM | POA: Diagnosis not present

## 2023-09-07 DIAGNOSIS — Z853 Personal history of malignant neoplasm of breast: Secondary | ICD-10-CM | POA: Diagnosis not present

## 2023-09-07 DIAGNOSIS — C50111 Malignant neoplasm of central portion of right female breast: Secondary | ICD-10-CM | POA: Diagnosis not present

## 2023-09-07 LAB — RAD ONC ARIA SESSION SUMMARY
Course Elapsed Days: 3
Plan Fractions Treated to Date: 4
Plan Prescribed Dose Per Fraction: 1.8 Gy
Plan Total Fractions Prescribed: 28
Plan Total Prescribed Dose: 50.4 Gy
Reference Point Dosage Given to Date: 7.2 Gy
Reference Point Session Dosage Given: 1.8 Gy
Session Number: 4

## 2023-09-08 ENCOUNTER — Ambulatory Visit

## 2023-09-08 ENCOUNTER — Other Ambulatory Visit: Payer: Self-pay

## 2023-09-08 ENCOUNTER — Ambulatory Visit
Admission: RE | Admit: 2023-09-08 | Discharge: 2023-09-08 | Disposition: A | Discharge status: Home / Self Care | Source: Ambulatory Visit | Attending: Radiation Oncology

## 2023-09-08 DIAGNOSIS — Z853 Personal history of malignant neoplasm of breast: Secondary | ICD-10-CM | POA: Diagnosis not present

## 2023-09-08 DIAGNOSIS — C519 Malignant neoplasm of vulva, unspecified: Secondary | ICD-10-CM | POA: Diagnosis not present

## 2023-09-08 DIAGNOSIS — C50111 Malignant neoplasm of central portion of right female breast: Secondary | ICD-10-CM | POA: Diagnosis not present

## 2023-09-08 DIAGNOSIS — Z79899 Other long term (current) drug therapy: Secondary | ICD-10-CM | POA: Diagnosis not present

## 2023-09-08 DIAGNOSIS — C774 Secondary and unspecified malignant neoplasm of inguinal and lower limb lymph nodes: Secondary | ICD-10-CM | POA: Diagnosis not present

## 2023-09-08 DIAGNOSIS — Z510 Encounter for antineoplastic radiation therapy: Secondary | ICD-10-CM | POA: Diagnosis not present

## 2023-09-08 LAB — RAD ONC ARIA SESSION SUMMARY
Course Elapsed Days: 4
Plan Fractions Treated to Date: 5
Plan Prescribed Dose Per Fraction: 1.8 Gy
Plan Total Fractions Prescribed: 28
Plan Total Prescribed Dose: 50.4 Gy
Reference Point Dosage Given to Date: 9 Gy
Reference Point Session Dosage Given: 1.8 Gy
Session Number: 5

## 2023-09-11 ENCOUNTER — Other Ambulatory Visit: Payer: Self-pay

## 2023-09-11 ENCOUNTER — Encounter: Payer: Self-pay | Admitting: Oncology

## 2023-09-11 ENCOUNTER — Encounter: Payer: Self-pay | Admitting: Hematology and Oncology

## 2023-09-11 ENCOUNTER — Other Ambulatory Visit: Payer: Self-pay | Admitting: Pharmacist

## 2023-09-11 ENCOUNTER — Ambulatory Visit
Admission: RE | Admit: 2023-09-11 | Discharge: 2023-09-11 | Disposition: A | Discharge status: Home / Self Care | Source: Ambulatory Visit | Attending: Radiation Oncology | Admitting: Radiation Oncology

## 2023-09-11 ENCOUNTER — Inpatient Hospital Stay (HOSPITAL_BASED_OUTPATIENT_CLINIC_OR_DEPARTMENT_OTHER): Admitting: Hematology and Oncology

## 2023-09-11 ENCOUNTER — Inpatient Hospital Stay

## 2023-09-11 VITALS — BP 124/75 | HR 60 | Temp 98.1°F | Resp 18 | Ht 63.0 in | Wt 214.1 lb

## 2023-09-11 DIAGNOSIS — C519 Malignant neoplasm of vulva, unspecified: Secondary | ICD-10-CM

## 2023-09-11 DIAGNOSIS — C774 Secondary and unspecified malignant neoplasm of inguinal and lower limb lymph nodes: Secondary | ICD-10-CM | POA: Diagnosis not present

## 2023-09-11 DIAGNOSIS — E860 Dehydration: Secondary | ICD-10-CM | POA: Insufficient documentation

## 2023-09-11 DIAGNOSIS — C50111 Malignant neoplasm of central portion of right female breast: Secondary | ICD-10-CM | POA: Diagnosis not present

## 2023-09-11 DIAGNOSIS — Z510 Encounter for antineoplastic radiation therapy: Secondary | ICD-10-CM | POA: Diagnosis not present

## 2023-09-11 DIAGNOSIS — Z79899 Other long term (current) drug therapy: Secondary | ICD-10-CM | POA: Diagnosis not present

## 2023-09-11 DIAGNOSIS — Z853 Personal history of malignant neoplasm of breast: Secondary | ICD-10-CM | POA: Diagnosis not present

## 2023-09-11 LAB — COMPREHENSIVE METABOLIC PANEL WITH GFR
ALT: 26 U/L (ref 0–44)
AST: 22 U/L (ref 15–41)
Albumin: 4.5 g/dL (ref 3.5–5.0)
Alkaline Phosphatase: 70 U/L (ref 38–126)
Anion gap: 11 (ref 5–15)
BUN: 20 mg/dL (ref 8–23)
CO2: 26 mmol/L (ref 22–32)
Calcium: 10.1 mg/dL (ref 8.9–10.3)
Chloride: 100 mmol/L (ref 98–111)
Creatinine, Ser: 1.08 mg/dL — ABNORMAL HIGH (ref 0.44–1.00)
GFR, Estimated: 54 mL/min — ABNORMAL LOW (ref >60)
Glucose, Bld: 110 mg/dL — ABNORMAL HIGH (ref 70–99)
Potassium: 4.6 mmol/L (ref 3.5–5.1)
Sodium: 137 mmol/L (ref 135–145)
Total Bilirubin: 0.2 mg/dL (ref 0.0–1.2)
Total Protein: 6.6 g/dL (ref 6.5–8.1)

## 2023-09-11 LAB — RAD ONC ARIA SESSION SUMMARY
Course Elapsed Days: 7
Plan Fractions Treated to Date: 6
Plan Prescribed Dose Per Fraction: 1.8 Gy
Plan Total Fractions Prescribed: 28
Plan Total Prescribed Dose: 50.4 Gy
Reference Point Dosage Given to Date: 10.8 Gy
Reference Point Session Dosage Given: 1.8 Gy
Session Number: 6

## 2023-09-11 LAB — CBC WITH DIFFERENTIAL (CANCER CENTER ONLY)
Abs Immature Granulocytes: 0.04 10*3/uL (ref 0.00–0.07)
Basophils Absolute: 0 10*3/uL (ref 0.0–0.1)
Basophils Relative: 1 %
Eosinophils Absolute: 0.2 10*3/uL (ref 0.0–0.5)
Eosinophils Relative: 4 %
HCT: 36.5 % (ref 36.0–46.0)
Hemoglobin: 11.7 g/dL — ABNORMAL LOW (ref 12.0–15.0)
Immature Granulocytes: 1 %
Lymphocytes Relative: 17 %
Lymphs Abs: 0.7 10*3/uL (ref 0.7–4.0)
MCH: 27.4 pg (ref 26.0–34.0)
MCHC: 32.1 g/dL (ref 30.0–36.0)
MCV: 85.5 fL (ref 80.0–100.0)
Monocytes Absolute: 0.5 10*3/uL (ref 0.1–1.0)
Monocytes Relative: 11 %
Neutro Abs: 2.8 10*3/uL (ref 1.7–7.7)
Neutrophils Relative %: 66 %
Platelet Count: 213 10*3/uL (ref 150–400)
RBC: 4.27 MIL/uL (ref 3.87–5.11)
RDW: 13.1 % (ref 11.5–15.5)
WBC Count: 4.2 10*3/uL (ref 4.0–10.5)
nRBC: 0 % (ref 0.0–0.2)

## 2023-09-11 LAB — MAGNESIUM: Magnesium: 2 mg/dL (ref 1.7–2.4)

## 2023-09-11 MED ORDER — SODIUM CHLORIDE 0.9 % IV SOLN
40.0000 mg/m2 | Freq: Once | INTRAVENOUS | Status: AC
  Filled 2023-09-11: qty 83

## 2023-09-11 MED ORDER — APREPITANT 130 MG/18ML IV EMUL
130.0000 mg | Freq: Once | INTRAVENOUS | Status: AC
  Filled 2023-09-11: qty 18

## 2023-09-11 MED ORDER — SODIUM CHLORIDE 0.9 % IV SOLN: INTRAVENOUS | Status: DC

## 2023-09-11 MED ORDER — POTASSIUM CHLORIDE IN NACL 20-0.9 MEQ/L-% IV SOLN
Freq: Once | INTRAVENOUS | Status: AC
  Filled 2023-09-11: qty 1000

## 2023-09-11 MED ORDER — PALONOSETRON HCL INJECTION 0.25 MG/5ML
0.2500 mg | Freq: Once | INTRAVENOUS | Status: AC
  Filled 2023-09-11: qty 5

## 2023-09-11 MED ORDER — MAGNESIUM SULFATE 2 GM/50ML IV SOLN
2.0000 g | Freq: Once | INTRAVENOUS | Status: AC
  Filled 2023-09-11: qty 50

## 2023-09-11 MED ORDER — SODIUM CHLORIDE 0.9% FLUSH: 10.0000 mL | INTRAVENOUS | Status: DC | PRN

## 2023-09-11 MED ORDER — HEPARIN SOD (PORK) LOCK FLUSH 100 UNIT/ML IV SOLN: 500.0000 [IU] | Freq: Once | INTRAVENOUS | Status: AC | PRN

## 2023-09-11 MED ORDER — DEXAMETHASONE SODIUM PHOSPHATE 10 MG/ML IJ SOLN
10.0000 mg | Freq: Once | INTRAMUSCULAR | Status: AC
  Filled 2023-09-11: qty 1

## 2023-09-11 NOTE — Progress Notes (Signed)
 [facility]  [address] [place],  [place]  [address] [phone]  Clinic Day: 09/11/23   Referring physician:  Elyn Han, MD   Assessment:  Invasive lobular carcinoma This was found on screening mammogram but the MRI reveals focal enhancement surrounding the biopsy clip up to 2.3 cm and there was non-mass enhancement posterior to this known malignancy on MRI.  This area was biopsied in order to guide her ultimate surgery.This is strongly ER/PR positive, HER2 negative and has a low Ki 67 of less than 5%.  She has now had a lumpectomy but had to go back for reexcision of the margins.  The sentinel lymph node was negative but she did have 2 primaries, measuring 2.4 cm and 2.2 cm, for a T2 N0 M0, stage IIA.  Even though she has several favorable characteristics, such as grade 1 histology and a low Ki 67, I still recommended that we pursue Endopredict testing to quantitate her risk for recurrence, and fortunately she has an EpClin score of 2.6, low risk.  This correlates with a 5.1% risk of distant recurrence in the next 10 years. Her benefit from chemotherapy would be 1.0% so we did not pursue. Her risk for a late recurrence is 4.0%. Unfortunately she has refused hormonal therapy.  Vulvar Carcinoma Stage IIIC She had a pelvic vulvectomy, bilateral debulking, radical lymphadenectomy, and inguinal dissection done on 06/28/2023, with 3/4 positive nodes on the left side, up to 3 cm and with extensive extranodal tumor. This was moderately differentiated squamous cell carcinoma measuring 3 cm with depth of invasion 5 mm and carcinoma in situ, for a T1b N2c M0. She had lymphovascular invasion and carcinoma in situ at the 6 o'clock margin with < 1 mm margin for invasive carcinoma. We will plan concurrent chemoradiation with cisplatin . She will also have immunotherapy with pembrolizuab since her PDL 1  score is 85%, and we will continue that as maintenance after the other is completed. PET scan revealed metastatic left inguinal lymph nodes up to 1.8 cm in diameter, but no additional evidence of metastatic disease in the neck, chest, abdomen or pelvis. There was a 4 mm right lower lobe nodule, too small for PET resolution.  We have recommended concurrent radiation, chemotherapy and immunotherapy but start of treatment has been delayed several times due to infection/cellulitis of the vulvar area.  Strong family history Including multiple females with breast cancer, her mother had colon cancer at age 94, and another maternal aunt had ovarian cancer.  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  She has been counseled on the implications of this and is already getting regular colonoscopy.  However the information will also be passed on to family members to pursue testing.  Osteopenia Her spine shows a T score of -2.6 for osteoporosis and the right femur has osteopenia.  At the very least she should be taking calcium  and vitamin D. Bone density scan done on 07/17/2023 revealed osteopenia.  Cellulitis of the Genitalia This has improved but now is much more red and inflamed consistent with cellulitis. We will therefore give her Augmentin  875mg  BID for 10 days and postpone the start of her treatment by 1 week.  She will be reevaluated next week by [facility].   Plan: PET scan revealed metastatic left inguinal lymph nodes up to 1.8 cm in diameter, but no additional evidence of metastatic disease in the neck, chest, abdomen or pelvis. There was a  4 mm right lower lobe nodule, too small for PET resolution.  Patient had a pelvic vulvectomy, bilateral debulking, radical lymphadenectomy, and inguinal dissection done on 06/28/2023.  Pathology revealed 3/4 positive nodes on the left side, up to 3 cm and with extensive extranodal tumor, and negative nodes on the right side. The  vulvar carcinoma measured 3 cm with depth of invasion 5 mm and carcinoma in situ, for a T1b N2c M0. She had lymphovascular invasion and carcinoma in situ at the 6 o'clock margin with < 1 mm margin for invasive carcinoma. She had a vulvar cellulitis that delayed her chemotherapy. This has improved significantly. She tolerated cycle 1 fairly well. She did note nausea and severe fatigue. She noted vision changes that were fairly severe including blurred vision. She notes this was worse after using her compazine  and zofran . She does not want to take those anymore. I will switch her to ativan at home for nausea. She is on xanax  for anxiety and I have asked her to stop this and use Ativan if needed for nausea; this will also help with her anxiety. We will schedule her for weekly IVF on Wednesdays or Thursdays to help with side effects of treatment. We will see her back in 1 week with CBC, CMP, magnesium , TSH, and T4. I discussed the assessment and treatment plan with the patient.  She was provided an opportunity to ask questions and all were answered.  The patient was advised to call back if she has further questions.   I provided 20 minutes of face-to-face time during this this encounter and > 50% was spent counseling as documented under my assessment and plan.   Baldomero Bone, FNP- [facility] [place] [facility] - A DEPT OF [name] [address] [place] [address] Dept: [phone] Dept Fax: [phone]   No orders of the defined types were placed in this encounter.   CHIEF COMPLAINTS/PURPOSE OF CONSULTATION:  Breast cancer, stage IIA  HISTORY OF PRESENTING ILLNESS:  KHAMIYA VARIN 73 y.o. 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  She does have annual mammograms because of her strong family history, but was on hormone replacement therapy for 10 years.  She had a  screening mammogram at [facility] on May 05, 2021 which revealed architectural distortion which was felt to be indeterminant.  She had a diagnostic mammogram on March 3 which showed mild nipple retraction and a 1 cm area of focal asymmetry in the retroareolar region.  Ultrasound revealed a hypoechoic irregular mass measuring 9 mm in that area.  On March 14 she had a biopsy performed which revealed a 0.9 cm irregular mass with spiculated margins in the right breast at 12:00, approximately 2 cm from the nipple and anteriorly in the breast.  This was found to be grade 2 invasive mammary carcinoma as well as mammary carcinoma in situ.  The E-cadherin immunohistochemistry stains were negative and so this is consistent with invasive lobular carcinoma.  The estrogen receptors were positive at 95% and progesterone receptors positive at 95% with HER2 by immunohistochemistry positive at 2+ but FISH was negative.  Ki-67 was less than 5%.  The carcinoma in situ was positive for E-cadherin, consistent with ductal carcinoma in situ.  She had an MRI of the breast and was found to have focal enhancement in the right breast surrounding the biopsy clip and some non-mass enhancement posterior  to the malignancy.  It was recommended that she have an MRI guided biopsy of the clumped non-mass enhancement area and this revealed invasive lobular carcinoma grade 1 with lobular carcinoma in situ and extensive fibrocystic changes with focal intraluminal microcalcification and intraductal papillomatosis.  Due to her strong family history including multiple females with breast cancer in her mother with colon cancer at age 39, she did have genetic testing with [facility] and was found to have a carrier mutation of MUTYH.  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  It was recommended that she have colonoscopy beginning at age  9 or 10 years prior to the age of her first-degree use relatives age at colorectal cancer diagnosis.  It was recommended this be repeated every 5 years.  This was explained to her.  She has now had a lumpectomy but had to go back for reexcision of the margins.  The sentinel lymph node was negative but she did have 2 primaries, measuring 2.4 cm and 2.2 cm, for a T2 N0 M0, stage IIA. This was ER/PR positive, HER 2 negative and a Ki-67 was 5%.  Even though she has several favorable characteristics, such as grade 1 histology and a low Ki 67, I still recommended that we pursue Endopredict testing to quantitate her risk for recurrence, and fortunately she has an EpClin score of 2.6, low risk. This correlates with a 5.1% risk of distant recurrence in the next 10 years, with only a 1.0% benefit of chemotherapy. Her risk of late recurrence is 4.0%. I recommended hormonal therapy but she refused.  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 PET scan revealed metastatic left inguinal lymph nodes up to 1.8 cm in diameter, but no additional evidence of metastatic disease in the neck, chest, abdomen or pelvis. There was a 4 mm right lower lobe nodule, too small for PET resolution.  Patient had a pelvic vulvectomy, bilateral debulking, radical lymphadenectomy, and inguinal dissection done on 06/28/2023.  Pathology revealed 3/4 positive nodes on the left side, up to 3 cm and with extensive extranodal tumor, and negative nodes on the right side. The vulvar carcinoma measured 3 cm with depth of invasion 5 mm and carcinoma in situ, for a T1b N2c M0. She had lymphovascular invasion and carcinoma in situ at the 6 o'clock margin with < 1 mm margin for invasive carcinoma.Diagnostic bilateral mammogram done on 05/04/2023 was clear. Bone density scan done on 07/17/2023 revealed osteopenia.  I reviewed her records extensively and collaborated the history with the  patient.  SUMMARY OF ONCOLOGIC HISTORY: Oncology History  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)  06/08/2021 Initial Diagnosis   Breast cancer in female [facility])   07/23/2021 Genetic Testing   Negative hereditary cancer genetic testing: no pathogenic variants detected in [facility] Panel. Report date is July 23, 2021.  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  The report date is Jul 26, 2021.  The BRCAplus panel offered by [employer] and includes sequencing and deletion/duplication analysis for the following 8 genes: ATM, BRCA1, BRCA2, CDH1, CHEK2, PALB2, PTEN, and TP53.  Results of pan-cancer panel pending.   The CancerNext-Expanded gene panel offered by [employer] and includes sequencing and rearrangement analysis for  the following 77 genes: AIP, ALK, APC*, ATM*, AXIN2, BAP1, BARD1, BLM, BMPR1A, BRCA1*, BRCA2*, BRIP1*, CDC73, CDH1*, CDK4, CDKN1B, CDKN2A, CHEK2*, CTNNA1, DICER1, FANCC, FH, FLCN, GALNT12, KIF1B, LZTR1, MAX, MEN1, MET, MLH1*, MSH2*, MSH3, MSH6*, MUTYH*, NBN, NF1*, NF2, NTHL1, PALB2*, PHOX2B, PMS2*, POT1, PRKAR1A, PTCH1, PTEN*, RAD51C*, RAD51D*, RB1, RECQL, RET, SDHA, SDHAF2, SDHB, SDHC, SDHD, SMAD4, SMARCA4, SMARCB1, SMARCE1, STK11, SUFU, TMEM127, TP53*, TSC1, TSC2, VHL and XRCC2 (sequencing and deletion/duplication); EGFR, EGLN1, HOXB13, KIT, MITF, PDGFRA, POLD1, and POLE (sequencing only); EPCAM and GREM1 (deletion/duplication only). DNA and RNA analyses performed for * genes.    08/23/2021 Cancer Staging   Staging form: Breast, AJCC 8th Edition - Pathologic stage from 08/23/2021: Stage IA (pT2(2), pN0(sn), cM0, G1, ER+, PR+, HER2-) - Signed by [name], MD on 09/29/2021 Histopathologic type: Lobular carcinoma, NOS Stage prefix: Initial diagnosis Method of lymph node assessment: Sentinel lymph node biopsy Nuclear grade:  G1 Multigene prognostic tests performed: EndoPredict Histologic grading system: 3 grade system Residual tumor (R): R0 - None Laterality: Right Tumor size (mm): 24 Multiple tumors: Yes Number of tumors: 2 Lymph-vascular invasion (LVI): LVI not present (absent)/not identified Diagnostic confirmation: Positive histology PLUS positive immunophenotyping and/or positive genetic studies Specimen type: Excision Staged by: Managing physician Menopausal status: Postmenopausal Ki-67 (%): 5 Stage used in treatment planning: Yes National guidelines used in treatment planning: Yes Type of national guideline used in treatment planning: NCCN   Vulvar cancer (HCC)  06/28/2023 Initial Diagnosis   Vulvar cancer (HCC)   06/28/2023 Cancer Staging   Staging form: Vulva, AJCC V9 - Clinical stage from 06/28/2023: FIGO Stage IIIC (cT1b, cN1c, cM0) - Signed by [name], MD on 07/31/2023 Histopathologic type: Squamous cell carcinoma, NOS Stage prefix: Initial diagnosis Method of lymph node assessment: Lymph node dissection Histologic grade (G): G2 Histologic grading system: 3 grade system Tumor size (mm): 30 Lymph-vascular invasion (LVI): LVI present/identified, NOS Diagnostic confirmation: Positive histology Specimen type: Excision Staged by: Managing physician Femoral-inguinal nodal status: Positive Solitary (s) or multifocal (m) tumors in the primary site: Solitary Perineural invasion (PNI): Unknown Stage used in treatment planning: Yes National guidelines used in treatment planning: Yes Type of national guideline used in treatment planning: NCCN   09/04/2023 -  Chemotherapy   Patient is on Treatment Plan : CERVICAL Cisplatin  (40) q7d + Pembrolizumab  q21d + XRT     10/16/2023 -  Chemotherapy   Patient is on Treatment Plan : CERVICAL Pembrolizumab  (200) q21d     In terms of breast cancer risk profile:  She menarched at early age of 74-13 and went to surgical menopause at age 85 She had 1  pregnancy, her first child was born at age 45 She was exposed hormone replacement therapy for 10 years in the form of a patch.  She has significant positive family history of Breast and ovarian and colon cancer  INTERVAL HISTORY: Yanelle is here today for a follow up for her vulvar carcinoma. She also had a breast cancer stage IIA, diagnosed in March of 2023. 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 PET scan revealed metastatic left inguinal lymph nodes up to 1.8 cm in diameter, but no additional evidence of metastatic disease in the neck, chest, abdomen or pelvis. There was a 4 mm right lower lobe nodule, too small for PET resolution.  Patient had a pelvic vulvectomy, bilateral  debulking, radical lymphadenectomy, and inguinal dissection done on 06/28/2023.  Pathology revealed 3/4 positive nodes on the left side, up to 3 cm and with extensive extranodal tumor, and negative nodes on the right side. The vulvar carcinoma measured 3 cm with depth of invasion 5 mm and carcinoma in situ, for a T1b N2c M0. She had lymphovascular invasion and carcinoma in situ at the 6 o'clock margin with < 1 mm margin for invasive carcinoma. She had some nausea and fatigue after her first cycle. She also experienced vision changes she believes were caused by zofran  and/or compazine .  She denies fever, chills, night sweats, or other signs of infection. She denies cardiorespiratory and gastrointestinal issues. She  denies pain. Her appetite is fair and Her weight has been stable.  MEDICAL HISTORY:  Past Medical History:  Diagnosis Date   Anemia    Anxiety    Cancer (HCC)    right breast ILC   Complication of anesthesia    difficulty waking up   Depression    Diverticulitis    Family history of breast cancer    Family history of ovarian cancer    Family history of  prostate cancer    Fibromyalgia    GERD (gastroesophageal reflux disease)    Heart murmur    History of colonic polyps    History of hiatal hernia    Hyperlipidemia    Hypertension    IBS (irritable bowel syndrome)    Ischemic colitis (HCC) 03/2012   Osteoporosis    Pre-diabetes    RLS (restless legs syndrome)    Sinusitis    Sleep apnea    CPAP nightly  Vitamin D deficiency Iron deficiency treated with IV iron infusions in 2019  SURGICAL HISTORY: Past Surgical History:  Procedure Laterality Date   BREAST LUMPECTOMY WITH RADIOACTIVE SEED LOCALIZATION Right 08/04/2021   Procedure: RIGHT BREAST LUMPECTOMY WITH RADIOACTIVE SEED LOCALIZATION;  Surgeon: Sim Dryer, MD;  Location: [facility] OR;  Service: General;  Laterality: Right;   CHOLECYSTECTOMY  2004   COLONOSCOPY WITH PROPOFOL  N/A 04/18/2017   Procedure: COLONOSCOPY WITH PROPOFOL ;  Surgeon: Tami Falcon, MD;  Location: [facility];  Service: Endoscopy;  Laterality: N/A;   ESOPHAGOGASTRODUODENOSCOPY (EGD) WITH PROPOFOL  N/A 04/18/2017   Procedure: ESOPHAGOGASTRODUODENOSCOPY (EGD) WITH PROPOFOL ;  Surgeon: Tami Falcon, MD;  Location: [facility];  Service: Endoscopy;  Laterality: N/A;   EXAM UNDER ANESTHESIA, PELVIC N/A 06/28/2023   Procedure: EXAM UNDER ANESTHESIA, PELVIC;  Surgeon: Suzi Essex, MD;  Location: [facility]S;  Service: Gynecology;  Laterality: N/A;   FLEXIBLE SIGMOIDOSCOPY  04/20/2012   Procedure: FLEXIBLE SIGMOIDOSCOPY;  Surgeon: Almeda Aris, MD;  Location: [facility];  Service: Endoscopy;  Laterality: N/A;   INGUINAL LYMPHADENECTOMY N/A 06/28/2023   Procedure: LYMPHADENECTOMY, INGUINAL, OPEN;  Surgeon: Suzi Essex, MD;  Location: [facility];  Service: Gynecology;  Laterality: N/A;   IR IMAGING GUIDED PORT INSERTION  07/31/2023   NISSEN FUNDOPLICATION  2011   PARAESOPHAGEAL HERNIA REPAIR  09/11/2009   and Nissen fundoplication   RADICAL VULVECTOMY N/A 06/28/2023   Procedure: VULVECTOMY, RADICAL;  Surgeon:  Suzi Essex, MD;  Location: [facility];  Service: Gynecology;  Laterality: N/A;  Possible skin flap   RE-EXCISION OF BREAST LUMPECTOMY Right 08/18/2021   Procedure: RE-EXCISION RIGHT BREAST LUMPECTOMY;  Surgeon: Sim Dryer, MD;  Location: [facility];  Service: General;  Laterality: Right;   SENTINEL NODE BIOPSY N/A 08/04/2021   Procedure: SENTINEL NODE BIOPSY;  Surgeon: Sim Dryer, MD;  Location: [facility];  Service: General;  Laterality: N/A;   TONSILLECTOMY  1960's   TOTAL ABDOMINAL HYSTERECTOMY  2001   w/ BSO  She had 2 large benign tumors of the ovaries removed with bilateral salpingo oophorectomy  SOCIAL HISTORY: Social History   Socioeconomic History   Marital status: Widowed    Spouse name: Royston Cornea   Number of children: 1   Years of education: Not on file   Highest education level: Some college, no degree  Occupational History   Occupation: disabled  Tobacco Use   Smoking status: Never   Smokeless tobacco: Never   Tobacco comments:    brief exposure through her husband  Vaping Use   Vaping status: Never Used  Substance and Sexual Activity   Alcohol use: Never    Alcohol/week: 0.0 standard drinks of alcohol   Drug use: Never   Sexual activity: Not Currently  Other Topics Concern   Not on file  Social History Narrative   From [place] originally. Previously lived in [place]. Previously worked at [employer] also in a [employer]. Has also worked as a Diplomatic Services operational officer for [employer]. No pets currently. No bird exposure. No known mold in her current home.    Lives with husband   Caffeine- coffee 2 c daily   Social Drivers of Health   Financial Resource Strain: Low Risk  (02/01/2022)   Overall Financial Resource Strain (CARDIA)    Difficulty of Paying Living Expenses: Not hard at all  Food Insecurity: No Food Insecurity (06/28/2023)   Hunger Vital Sign    Worried About Running Out of Food in the Last Year: Never true    Ran Out of Food in the Last  Year: Never true  Transportation Needs: No Transportation Needs (06/28/2023)   PRAPARE - Administrator, Civil Service (Medical): No    Lack of Transportation (Non-Medical): No  Physical Activity: Not on file  Stress: Not on file  Social Connections: Moderately Isolated (06/28/2023)   Social Connection and Isolation Panel    Frequency of Communication with Friends and Family: More than three times a week    Frequency of Social Gatherings with Friends and Family: Twice a week    Attends Religious Services: More than 4 times per year    Active Member of [employer] or Organizations: No    Attends Banker Meetings: Never    Marital Status: Widowed  Intimate Partner Violence: Not At Risk (06/28/2023)   Humiliation, Afraid, Rape, and Kick questionnaire    Fear of Current or Ex-Partner: No    Emotionally Abused: No    Physically Abused: No    Sexually Abused: No   The patient is here with her husband Arch Ko and daughter Hospital doctor today.  She has never smoked and does not drink alcohol or use drugs. FAMILY HISTORY: Family History  Problem Relation Age of Onset   Colon cancer Mother 71   Alzheimer's disease Mother    Heart attack Father    Cancer Maternal Aunt        x2   Alzheimer's disease Maternal Aunt    Ovarian cancer Maternal Aunt    Prostate cancer Maternal Uncle    Kidney disease Maternal Uncle    Atrial fibrillation Maternal Uncle    Stroke Paternal Uncle    Aneurysm Paternal Grandmother        brain   Stroke Paternal Grandfather    Prostate cancer Cousin        paternal first  cousin   Prostate cancer Cousin        paternal first cousin   Esophageal cancer Cousin        maternal first cousin   Breast cancer Cousin        DCIS, maternal first cousin   Ovarian cancer Cousin        maternal first cousin   Rheum arthritis Other    Lung disease Neg Hx   Ovarian cancer                                               maternal aunt                          50's Breast cancer                                                 cousin                                     50's Prostate cancer                                              maternal uncle                        78's  ALLERGIES:  is allergic to ceftin [cefuroxime axetil], cefuroxime, other, sulfa  antibiotics, and tramadol hcl.  MEDICATIONS:  Current Outpatient Medications  Medication Sig Dispense Refill   acetaminophen  (TYLENOL ) 650 MG CR tablet 1,300 mg in the morning and at bedtime.     ALPRAZolam  (XANAX ) 0.25 MG tablet Take 0.25 mg by mouth at bedtime as needed for anxiety.     amLODipine  (NORVASC ) 5 MG tablet Take 5 mg by mouth daily.     Ascorbic Acid (VITAMIN C) 500 MG CHEW Chew 500 mg by mouth daily.     aspirin EC 81 MG tablet Take 81 mg by mouth at bedtime. Swallow whole.     atenolol  (TENORMIN ) 50 MG tablet Take 75 mg by mouth daily.      benazepril  (LOTENSIN ) 10 MG tablet Take 10 mg by mouth daily.     benzonatate (TESSALON) 100 MG capsule Take 100-200 mg by mouth at bedtime.     Biotin 1000 MCG tablet Take 1,000 mcg by mouth daily.     Calcium  Citrate-Vitamin D (CITRACAL + D PO) Take 1 tablet by mouth daily.     cetirizine (ZYRTEC) 10 MG tablet Take 10 mg by mouth at bedtime.     Cholecalciferol (VITAMIN D) 50 MCG (2000 UT) tablet Take 2,000 Units by mouth daily.     cyanocobalamin 1000 MCG tablet Take 1,000 mcg by mouth daily.     diclofenac sodium (VOLTAREN) 1 % GEL Apply 1 application  topically daily as needed (for pain).     diphenhydrAMINE  (BENADRYL ) 25 MG tablet Take 25 mg by mouth every 6 (six) hours as needed for allergies.     diphenoxylate-atropine (LOMOTIL)  2.5-0.025 MG tablet Take 2 tablets by mouth 4 (four) times daily as needed for diarrhea or loose stools.     DULoxetine  (CYMBALTA ) 60 MG capsule Take 120 mg by mouth at bedtime.     EPINEPHrine  (EPIPEN  2-PAK) 0.3 mg/0.3 mL IJ SOAJ injection Inject 0.3 mg into the muscle as needed for anaphylaxis.     estradiol   (ESTRACE  VAGINAL) 0.1 MG/GM vaginal cream Place 1 Applicatorful vaginally 3 (three) times a week. 42.5 g 12   famotidine  (PEPCID ) 20 MG tablet Take 20 mg by mouth at bedtime.     fexofenadine (ALLEGRA) 180 MG tablet Take 180 mg by mouth in the morning.     fluticasone  (FLONASE ) 50 MCG/ACT nasal spray Place 1 spray into both nostrils daily.     lidocaine  (XYLOCAINE ) 5 % ointment Apply 1 Application topically 2 (two) times daily as needed for moderate pain (pain score 4-6) (to the vulva). Apply to vulva as needed for discomfort 35.44 g 3   lidocaine -prilocaine  (EMLA ) cream Apply to affected area once 30 g 3   NON FORMULARY Pt uses a c-pap nightly     Nutritional Supplements (JUICE PLUS FIBRE PO) Take 2 each by mouth daily. Fruits and Vegetables     nystatin  (MYCOSTATIN /NYSTOP ) powder Apply 1 Application topically 3 (three) times daily. 30 g 5   Omega-3 Fatty Acids (FISH OIL) 1000 MG CAPS Take 1,000 mg by mouth daily.      ondansetron  (ZOFRAN ) 4 MG tablet Take 4 mg by mouth every 8 (eight) hours as needed for nausea or vomiting.     ondansetron  (ZOFRAN ) 8 MG tablet Take 1 tablet (8 mg total) by mouth every 8hrs as needed for nausea or vomiting. Start on the third day after cisplatin . 30 tablet 1   pantoprazole  (PROTONIX ) 40 MG tablet Take 40 mg by mouth every evening.     Phenylephrine -APAP-Guaifenesin (TYLENOL  SINUS SEVERE PO) Take 2 tablets by mouth daily as needed (drainage).     polyvinyl alcohol (LIQUIFILM TEARS) 1.4 % ophthalmic solution Place 1 drop into both eyes 5 (five) times daily.     Probiotic Product (PROBIOTIC & ACIDOPHILUS EX ST PO) Take 1 capsule by mouth daily. Ultra Flora Plus Capsules     prochlorperazine  (COMPAZINE ) 10 MG tablet Take 1 tablet (10 mg total) by mouth every 6 (six) hours as needed for nausea or vomiting. 30 tablet 1   Propylene Glycol (SYSTANE COMPLETE) 0.6 % SOLN Place 1 drop into both eyes in the morning and at bedtime.     rOPINIRole  (REQUIP ) 0.5 MG tablet Take  0.5 mg by mouth at bedtime.     senna (SENOKOT) 8.6 MG tablet 2 tablets at bedtime as needed Orally Once a day     senna-docusate (SENOKOT-S) 8.6-50 MG tablet Take 2 tablets by mouth at bedtime. For AFTER surgery, do not take if having diarrhea (Patient taking differently: Take 1 tablet by mouth at bedtime.) 30 tablet 0   simvastatin  (ZOCOR ) 10 MG tablet Take 10 mg by mouth at bedtime.       sucralfate  (CARAFATE ) 1 GM/10ML suspension Take 1 g by mouth 2 (two) times daily.     traZODone  (DESYREL ) 50 MG tablet Take 50 mg by mouth at bedtime.     triamcinolone  (KENALOG ) 0.025 % ointment Apply 1 Application topically 2 (two) times daily. 30 g 0   vitamin E 400 UNIT capsule Take 400 Units by mouth daily.       White Petrolatum -Mineral Oil (REFRESH P.M. OP) Place  1 Application into both eyes at bedtime.     No current facility-administered medications for this visit.   Facility-Administered Medications Ordered in Other Visits  Medication Dose Route Frequency Provider Last Rate Last Admin   0.9 %  sodium chloride  infusion   Intravenous Continuous [name], MD 10 mL/hr at 09/04/23 1419 Infusion Verify at 09/04/23 1419   sodium chloride  flush (NS) 0.9 % injection 10 mL  10 mL Intracatheter PRN [name], MD   10 mL at 09/04/23 1613   REVIEW OF SYSTEMS:   Review of Systems  Constitutional: Negative.  Negative for appetite change, chills, diaphoresis, fatigue, fever and unexpected weight change.  HENT:  Negative.  Negative for hearing loss, lump/mass, mouth sores, nosebleeds, sore throat, tinnitus, trouble swallowing and voice change.   Eyes: Negative.  Negative for eye problems and icterus.  Respiratory: Negative.  Negative for chest tightness, cough, hemoptysis, shortness of breath and wheezing.   Cardiovascular: Negative.  Negative for chest pain, leg swelling and palpitations.  Gastrointestinal: Negative.  Negative for abdominal distention, abdominal pain, blood in stool,  constipation, diarrhea, nausea, rectal pain and vomiting.  Endocrine: Negative.   Genitourinary: Negative.  Negative for bladder incontinence, difficulty urinating, dyspareunia, dysuria, frequency, hematuria, menstrual problem, nocturia, pelvic pain, vaginal bleeding and vaginal discharge.        Pain of her vulva and bilateral groin area rating a 2/10 after taking tylenol .  Musculoskeletal:  Positive for arthralgias (left foot) and back pain (lower back/flank, 3/10). Negative for flank pain, gait problem, myalgias, neck pain and neck stiffness.  Skin: Negative.  Negative for itching, rash and wound.  Neurological:  Negative for dizziness, extremity weakness, gait problem, headaches, light-headedness, numbness, seizures and speech difficulty.  Hematological: Negative.  Negative for adenopathy. Does not bruise/bleed easily.  Psychiatric/Behavioral: Negative.  Negative for confusion, decreased concentration, depression, sleep disturbance and suicidal ideas. The patient is not nervous/anxious.    PHYSICAL EXAMINATION: ECOG PERFORMANCE STATUS: 1 - Symptomatic but completely ambulatory  Physical Exam Vitals and nursing note reviewed.  Constitutional:      General: She is not in acute distress.    Appearance: Normal appearance. She is normal weight. She is not ill-appearing, toxic-appearing or diaphoretic.  HENT:     Head: Normocephalic and atraumatic.     Right Ear: Tympanic membrane, ear canal and external ear normal. There is no impacted cerumen.     Left Ear: Tympanic membrane, ear canal and external ear normal. There is no impacted cerumen.     Nose: Nose normal. No congestion or rhinorrhea.     Mouth/Throat:     Mouth: Mucous membranes are moist.     Pharynx: Oropharynx is clear. No oropharyngeal exudate or posterior oropharyngeal erythema.   Eyes:     General: No scleral icterus.       Right eye: No discharge.        Left eye: No discharge.     Extraocular Movements: Extraocular  movements intact.     Conjunctiva/sclera: Conjunctivae normal.     Pupils: Pupils are equal, round, and reactive to light.   Neck:     Vascular: No carotid bruit.   Cardiovascular:     Rate and Rhythm: Normal rate and regular rhythm.     Pulses: Normal pulses.     Heart sounds: Normal heart sounds. No murmur heard.    No friction rub. No gallop.  Pulmonary:     Effort: Pulmonary effort is normal. No respiratory distress.  Breath sounds: Normal breath sounds. No stridor. No wheezing, rhonchi or rales.  Chest:     Chest wall: No tenderness.     Comments: No masses in either breast Well healed right axillary scar Abdominal:     General: Bowel sounds are normal. There is no distension.     Palpations: Abdomen is soft. There is no hepatomegaly, splenomegaly or mass.     Tenderness: There is no abdominal tenderness. There is no right CVA tenderness, left CVA tenderness, guarding or rebound.     Hernia: No hernia is present.  Genitourinary:    Comments:    Musculoskeletal:        General: No swelling, deformity or signs of injury. Normal range of motion.     Right shoulder: No tenderness. Normal range of motion.     Cervical back: Normal range of motion and neck supple. No rigidity or tenderness.     Right lower leg: No edema.     Left lower leg: No edema.  Lymphadenopathy:     Cervical: No cervical adenopathy.     Right cervical: No superficial, deep or posterior cervical adenopathy.    Left cervical: No superficial, deep or posterior cervical adenopathy.     Upper Body:     Right upper body: No supraclavicular, axillary or pectoral adenopathy.     Left upper body: No supraclavicular, axillary or pectoral adenopathy.   Skin:    General: Skin is warm and dry.     Coloration: Skin is not jaundiced or pale.     Findings: No bruising, erythema, lesion or rash.   Neurological:     General: No focal deficit present.     Mental Status: She is alert and oriented to person, place,  and time. Mental status is at baseline.     Cranial Nerves: No cranial nerve deficit.     Sensory: No sensory deficit.     Motor: No weakness.     Coordination: Coordination normal.     Gait: Gait normal.     Deep Tendon Reflexes: Reflexes normal.   Psychiatric:        Mood and Affect: Mood normal.        Behavior: Behavior normal.        Thought Content: Thought content normal.        Judgment: Judgment normal.    LABORATORY DATA:  I have reviewed the data as listed Lab Results  Component Value Date   WBC 5.3 09/01/2023   HGB 12.2 09/01/2023   HCT 37.0 09/01/2023   MCV 84.1 09/01/2023   PLT 211 09/01/2023    Lab Results  Component Value Date   CREATININE 0.98 09/01/2023   BUN 17 09/01/2023   NA 139 09/01/2023   K 4.0 09/01/2023   CL 105 09/01/2023   CO2 23 09/01/2023      Component Value Date/Time   PROT 6.5 09/01/2023 1431   ALBUMIN 4.4 09/01/2023 1431   AST 17 09/01/2023 1431   ALT 17 09/01/2023 1431   ALKPHOS 74 09/01/2023 1431   BILITOT <0.2 09/01/2023 1431   Lab Results  Component Value Date   TSH 0.771 08/18/2023   T4TOTAL 8.2 08/18/2023   PATHOLOGY:   SURGICAL PATHOLOGY CASE: [id number] PATIENT: Doris Reyes Surgical Pathology Report  Clinical History: Vulvar cancer (jlr)  FINAL MICROSCOPIC DIAGNOSIS:  A. 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 Largest metastasis is 3 cm. Extensive extranodal tumor.  B. LYMPH  NODE, RIGHT INGUINAL, RESECTION: Two lymph nodes negative for metastatic carcinoma (0/2).  C. 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 Greatest depth of invasion is 5 mm. Carcinoma in situ focally involves the 6:00 margin. Invasive carcinoma focally less than 1 mm from 6:00 margin. Lymphovascular space involvement by carcinoma. See oncology table and comment.  D. LATERAL DEEP  MARGIN: Benign adipose tissue and connective tissue. Negative for carcinoma. Final lateral deep margin negative for carcinoma.  ONCOLOGY TABLE: VULVA, CARCINOMA: Resection Procedure: Pelvic vulvectomy and inguinal lymphadenectomy Tumor Focality: Unifocal Tumor Site: Left vulva Tumor Size: 3 x 2.8 cm Histologic Type: Squamous cell carcinoma Histologic Grade: Moderately differentiated Depth of Invasion:      Specify depth of invasion (mm): 5 mm Other Tissue/ Organ Involvement: Not applicable Lymphovascular Invasion: Present Margins:      Margins Involved by Invasive Carcinoma: All margins negative for invasive carcinoma      Invasive carcinoma focally less than 1 mm from 6:00 margin      Margin Status for HSIL or dVIN: 6:00 margin focally positive for carcinoma in situ Regional Lymph Nodes           Lymph Nodes Examined:                                   0 Sentinel                                   6 non-sentinel                                   6 total           Number of Nodes with Metastasis 5 mm or Greater: 2           Number of Nodes with Metastasis Less than 5 mm (excludes isolated tumor cells): 1           Number of Nodes with Isolated Tumor Cells (0.2 mm or less): 0           Additional Lymph Node Findings: Extensive extranodal extension Distant Metastasis:      Distant Site(s) Involved: Not applicable Pathologic Stage Classification (pTNM, AJCC 8th Edition): pT1b (IB), pN2c (IIIC) Ancillary Studies: Can be performed if requested Representative Tumor Block: C3-C5 Comment(s): The tumor is moderately differentiated invasive squamous cell carcinoma which is 3 cm in greatest dimension and shows 5 mm in greatest stromal invasion.  There is associated carcinoma in situ which focally involves the 6:00 margin and invasive carcinoma is focally less than 1 mm from the 6:00 margin.  Invasive carcinoma is also focally 2 mm from the medial dermal margin in the midportion of the  specimen. (v4.2.0.2)    RADIOGRAPHIC STUDIES: No results found.  EXAM: 07/17/2023 DUAL X-RAY ABSORPTIOMETRY (DXA) FOR BONE MINERAL DENSITY LUMBAR SPINE (L2-L4): BMD (in g/cm2): 0.912 T-score: -2.4 Z-score: -0.7   LEFT FEMORAL NECK: BMD (in g/cm2): 0.793 T-score: -1.8 Z-score: 0.0   LEFT TOTAL HIP: BMD (in g/cm2): 0.922 T-score: -0.7 Z-score: 0.9   RIGHT FEMORAL NECK: BMD (in g/cm2): 0.855 T-score: -1.3 Z-score: 0.5   RIGHT TOTAL HIP: BMD (in g/cm2): 0.896 T-score: -0.9 Z-score: 0.7    EXAM: 06/05/2023 NUCLEAR  MEDICINE PET SKULL BASE TO THIGH IMPRESSION: 1. Metastatic left inguinal lymph nodes. No additional evidence of metastatic disease in the neck, chest, abdomen or pelvis. 2. 4 mm right lower lobe nodule, too small for PET resolution. Recommend attention on follow-up. 3. Small to moderate hiatal hernia. 4.  Aortic atherosclerosis (ICD10-I70.0).

## 2023-09-11 NOTE — Patient Instructions (Signed)
 Cisplatin Injection What is this medication? CISPLATIN (SIS pla tin) treats some types of cancer. It works by slowing down the growth of cancer cells. This medicine may be used for other purposes; ask your health care provider or pharmacist if you have questions. COMMON BRAND NAME(S): Platinol, Platinol -AQ What should I tell my care team before I take this medication? They need to know if you have any of these conditions: Eye disease, vision problems Hearing problems Kidney disease Low blood counts, such as low white cells, platelets, or red blood cells Tingling of the fingers or toes, or other nerve disorder An unusual or allergic reaction to cisplatin, carboplatin, oxaliplatin, other medications, foods, dyes, or preservatives If you or your partner are pregnant or trying to get pregnant Breast-feeding How should I use this medication? This medication is injected into a vein. It is given by your care team in a hospital or clinic setting. Talk to your care team about the use of this medication in children. Special care may be needed. Overdosage: If you think you have taken too much of this medicine contact a poison control center or emergency room at once. NOTE: This medicine is only for you. Do not share this medicine with others. What if I miss a dose? Keep appointments for follow-up doses. It is important not to miss your dose. Call your care team if you are unable to keep an appointment. What may interact with this medication? Do not take this medication with any of the following: Live virus vaccines This medication may also interact with the following: Certain antibiotics, such as amikacin, gentamicin, neomycin, polymyxin B, streptomycin, tobramycin, vancomycin Foscarnet This list may not describe all possible interactions. Give your health care provider a list of all the medicines, herbs, non-prescription drugs, or dietary supplements you use. Also tell them if you smoke, drink  alcohol, or use illegal drugs. Some items may interact with your medicine. What should I watch for while using this medication? Your condition will be monitored carefully while you are receiving this medication. You may need blood work done while taking this medication. This medication may make you feel generally unwell. This is not uncommon, as chemotherapy can affect healthy cells as well as cancer cells. Report any side effects. Continue your course of treatment even though you feel ill unless your care team tells you to stop. This medication may increase your risk of getting an infection. Call your care team for advice if you get a fever, chills, sore throat, or other symptoms of a cold or flu. Do not treat yourself. Try to avoid being around people who are sick. Avoid taking medications that contain aspirin, acetaminophen, ibuprofen, naproxen, or ketoprofen unless instructed by your care team. These medications may hide a fever. This medication may increase your risk to bruise or bleed. Call your care team if you notice any unusual bleeding. Be careful brushing or flossing your teeth or using a toothpick because you may get an infection or bleed more easily. If you have any dental work done, tell your dentist you are receiving this medication. Drink fluids as directed while you are taking this medication. This will help protect your kidneys. Call your care team if you get diarrhea. Do not treat yourself. Talk to your care team if you or your partner wish to become pregnant or think you might be pregnant. This medication can cause serious birth defects if taken during pregnancy and for 14 months after the last dose. A negative pregnancy  test is required before starting this medication. A reliable form of contraception is recommended while taking this medication and for 14 months after the last dose. Talk to your care team about effective forms of contraception. Do not father a child while taking this  medication and for 11 months after the last dose. Use a condom during sex during this time period. Do not breast-feed while taking this medication. This medication may cause infertility. Talk to your care team if you are concerned about your fertility. What side effects may I notice from receiving this medication? Side effects that you should report to your care team as soon as possible: Allergic reactions--skin rash, itching, hives, swelling of the face, lips, tongue, or throat Eye pain, change in vision, vision loss Hearing loss, ringing in ears Infection--fever, chills, cough, sore throat, wounds that don't heal, pain or trouble when passing urine, general feeling of discomfort or being unwell Kidney injury--decrease in the amount of urine, swelling of the ankles, hands, or feet Low red blood cell level--unusual weakness or fatigue, dizziness, headache, trouble breathing Painful swelling, warmth, or redness of the skin, blisters or sores at the infusion site Pain, tingling, or numbness in the hands or feet Unusual bruising or bleeding Side effects that usually do not require medical attention (report to your care team if they continue or are bothersome): Hair loss Nausea Vomiting This list may not describe all possible side effects. Call your doctor for medical advice about side effects. You may report side effects to FDA at 1-800-FDA-1088. Where should I keep my medication? This medication is given in a hospital or clinic. It will not be stored at home. NOTE: This sheet is a summary. It may not cover all possible information. If you have questions about this medicine, talk to your doctor, pharmacist, or health care provider.  2024 [employer]vier/Gold Standard (2021-07-16 00:00:00)

## 2023-09-12 ENCOUNTER — Encounter: Payer: Self-pay | Admitting: Oncology

## 2023-09-12 ENCOUNTER — Other Ambulatory Visit: Payer: Self-pay | Admitting: Hematology and Oncology

## 2023-09-12 ENCOUNTER — Ambulatory Visit

## 2023-09-12 ENCOUNTER — Other Ambulatory Visit (HOSPITAL_BASED_OUTPATIENT_CLINIC_OR_DEPARTMENT_OTHER): Payer: Self-pay

## 2023-09-12 ENCOUNTER — Other Ambulatory Visit: Payer: Self-pay

## 2023-09-12 ENCOUNTER — Ambulatory Visit: Admitting: Oncology

## 2023-09-12 ENCOUNTER — Ambulatory Visit
Admission: RE | Admit: 2023-09-12 | Discharge: 2023-09-12 | Disposition: A | Discharge status: Home / Self Care | Source: Ambulatory Visit | Attending: Radiation Oncology | Admitting: Radiation Oncology

## 2023-09-12 ENCOUNTER — Other Ambulatory Visit

## 2023-09-12 DIAGNOSIS — Z853 Personal history of malignant neoplasm of breast: Secondary | ICD-10-CM | POA: Diagnosis not present

## 2023-09-12 DIAGNOSIS — Z79899 Other long term (current) drug therapy: Secondary | ICD-10-CM | POA: Diagnosis not present

## 2023-09-12 DIAGNOSIS — Z510 Encounter for antineoplastic radiation therapy: Secondary | ICD-10-CM | POA: Diagnosis not present

## 2023-09-12 DIAGNOSIS — C774 Secondary and unspecified malignant neoplasm of inguinal and lower limb lymph nodes: Secondary | ICD-10-CM | POA: Diagnosis not present

## 2023-09-12 DIAGNOSIS — C519 Malignant neoplasm of vulva, unspecified: Secondary | ICD-10-CM | POA: Diagnosis not present

## 2023-09-12 DIAGNOSIS — C50111 Malignant neoplasm of central portion of right female breast: Secondary | ICD-10-CM | POA: Diagnosis not present

## 2023-09-12 LAB — RAD ONC ARIA SESSION SUMMARY
Course Elapsed Days: 8
Plan Fractions Treated to Date: 7
Plan Prescribed Dose Per Fraction: 1.8 Gy
Plan Total Fractions Prescribed: 28
Plan Total Prescribed Dose: 50.4 Gy
Reference Point Dosage Given to Date: 12.6 Gy
Reference Point Session Dosage Given: 1.8 Gy
Session Number: 7

## 2023-09-12 MED ORDER — LORAZEPAM 1 MG PO TABS
1.0000 mg | ORAL_TABLET | Freq: Three times a day (TID) | ORAL | 0 refills | Status: DC
  Filled 2023-09-12: qty 30, 10d supply, fill #0

## 2023-09-13 ENCOUNTER — Ambulatory Visit
Admission: RE | Admit: 2023-09-13 | Discharge: 2023-09-13 | Disposition: A | Discharge status: Home / Self Care | Source: Ambulatory Visit | Attending: Radiation Oncology

## 2023-09-13 ENCOUNTER — Other Ambulatory Visit: Payer: Self-pay

## 2023-09-13 DIAGNOSIS — C519 Malignant neoplasm of vulva, unspecified: Secondary | ICD-10-CM | POA: Diagnosis not present

## 2023-09-13 DIAGNOSIS — Z79899 Other long term (current) drug therapy: Secondary | ICD-10-CM | POA: Diagnosis not present

## 2023-09-13 DIAGNOSIS — Z853 Personal history of malignant neoplasm of breast: Secondary | ICD-10-CM | POA: Diagnosis not present

## 2023-09-13 DIAGNOSIS — C50111 Malignant neoplasm of central portion of right female breast: Secondary | ICD-10-CM | POA: Diagnosis not present

## 2023-09-13 DIAGNOSIS — Z510 Encounter for antineoplastic radiation therapy: Secondary | ICD-10-CM | POA: Diagnosis not present

## 2023-09-13 DIAGNOSIS — C774 Secondary and unspecified malignant neoplasm of inguinal and lower limb lymph nodes: Secondary | ICD-10-CM | POA: Diagnosis not present

## 2023-09-13 LAB — RAD ONC ARIA SESSION SUMMARY
Course Elapsed Days: 9
Plan Fractions Treated to Date: 8
Plan Prescribed Dose Per Fraction: 1.8 Gy
Plan Total Fractions Prescribed: 28
Plan Total Prescribed Dose: 50.4 Gy
Reference Point Dosage Given to Date: 14.4 Gy
Reference Point Session Dosage Given: 1.8 Gy
Session Number: 8

## 2023-09-14 ENCOUNTER — Inpatient Hospital Stay

## 2023-09-14 ENCOUNTER — Encounter: Payer: Self-pay | Admitting: Oncology

## 2023-09-14 ENCOUNTER — Ambulatory Visit
Admission: RE | Admit: 2023-09-14 | Discharge: 2023-09-14 | Disposition: A | Discharge status: Home / Self Care | Source: Ambulatory Visit | Attending: Radiation Oncology | Admitting: Radiation Oncology

## 2023-09-14 ENCOUNTER — Other Ambulatory Visit: Payer: Self-pay

## 2023-09-14 VITALS — BP 128/57 | HR 59 | Temp 98.1°F

## 2023-09-14 DIAGNOSIS — C50111 Malignant neoplasm of central portion of right female breast: Secondary | ICD-10-CM | POA: Diagnosis not present

## 2023-09-14 DIAGNOSIS — Z510 Encounter for antineoplastic radiation therapy: Secondary | ICD-10-CM | POA: Diagnosis not present

## 2023-09-14 DIAGNOSIS — Z79899 Other long term (current) drug therapy: Secondary | ICD-10-CM | POA: Diagnosis not present

## 2023-09-14 DIAGNOSIS — Z853 Personal history of malignant neoplasm of breast: Secondary | ICD-10-CM | POA: Diagnosis not present

## 2023-09-14 DIAGNOSIS — E860 Dehydration: Secondary | ICD-10-CM

## 2023-09-14 DIAGNOSIS — C774 Secondary and unspecified malignant neoplasm of inguinal and lower limb lymph nodes: Secondary | ICD-10-CM | POA: Diagnosis not present

## 2023-09-14 DIAGNOSIS — C519 Malignant neoplasm of vulva, unspecified: Secondary | ICD-10-CM | POA: Diagnosis not present

## 2023-09-14 LAB — RAD ONC ARIA SESSION SUMMARY
Course Elapsed Days: 10
Plan Fractions Treated to Date: 9
Plan Prescribed Dose Per Fraction: 1.8 Gy
Plan Total Fractions Prescribed: 28
Plan Total Prescribed Dose: 50.4 Gy
Reference Point Dosage Given to Date: 16.2 Gy
Reference Point Session Dosage Given: 1.8 Gy
Session Number: 9

## 2023-09-14 MED ORDER — HEPARIN SOD (PORK) LOCK FLUSH 100 UNIT/ML IV SOLN
500.0000 [IU] | Freq: Once | INTRAVENOUS | Status: AC | PRN
  Administered 2023-09-14: 500 [IU]

## 2023-09-14 MED ORDER — SODIUM CHLORIDE 0.9 % IV SOLN: Freq: Once | INTRAVENOUS | Status: AC

## 2023-09-14 MED ORDER — SODIUM CHLORIDE 0.9% FLUSH
10.0000 mL | Freq: Once | INTRAVENOUS | Status: AC | PRN
  Administered 2023-09-14: 10 mL

## 2023-09-14 NOTE — Addendum Note (Signed)
 Encounter addended by: Karolynn Pack on: 09/14/2023 12:17 PM  Actions taken: Imaging Exam ended

## 2023-09-15 ENCOUNTER — Ambulatory Visit

## 2023-09-16 ENCOUNTER — Other Ambulatory Visit: Payer: Self-pay

## 2023-09-18 ENCOUNTER — Encounter: Payer: Self-pay | Admitting: Hematology and Oncology

## 2023-09-18 ENCOUNTER — Other Ambulatory Visit: Payer: Self-pay

## 2023-09-18 ENCOUNTER — Ambulatory Visit
Admission: RE | Admit: 2023-09-18 | Discharge: 2023-09-18 | Disposition: A | Discharge status: Home / Self Care | Source: Ambulatory Visit | Attending: Radiation Oncology | Admitting: Radiation Oncology

## 2023-09-18 ENCOUNTER — Inpatient Hospital Stay

## 2023-09-18 ENCOUNTER — Inpatient Hospital Stay (HOSPITAL_BASED_OUTPATIENT_CLINIC_OR_DEPARTMENT_OTHER): Admitting: Hematology and Oncology

## 2023-09-18 VITALS — BP 155/90 | HR 72 | Temp 97.9°F | Resp 18 | Ht 63.0 in | Wt 214.4 lb

## 2023-09-18 DIAGNOSIS — R32 Unspecified urinary incontinence: Secondary | ICD-10-CM

## 2023-09-18 DIAGNOSIS — C50111 Malignant neoplasm of central portion of right female breast: Secondary | ICD-10-CM | POA: Diagnosis not present

## 2023-09-18 DIAGNOSIS — C519 Malignant neoplasm of vulva, unspecified: Secondary | ICD-10-CM

## 2023-09-18 DIAGNOSIS — Z853 Personal history of malignant neoplasm of breast: Secondary | ICD-10-CM | POA: Diagnosis not present

## 2023-09-18 DIAGNOSIS — C774 Secondary and unspecified malignant neoplasm of inguinal and lower limb lymph nodes: Secondary | ICD-10-CM | POA: Diagnosis not present

## 2023-09-18 DIAGNOSIS — I499 Cardiac arrhythmia, unspecified: Secondary | ICD-10-CM

## 2023-09-18 DIAGNOSIS — Z79899 Other long term (current) drug therapy: Secondary | ICD-10-CM | POA: Diagnosis not present

## 2023-09-18 DIAGNOSIS — Z510 Encounter for antineoplastic radiation therapy: Secondary | ICD-10-CM | POA: Diagnosis not present

## 2023-09-18 LAB — RAD ONC ARIA SESSION SUMMARY
Course Elapsed Days: 14
Plan Fractions Treated to Date: 10
Plan Prescribed Dose Per Fraction: 1.8 Gy
Plan Total Fractions Prescribed: 28
Plan Total Prescribed Dose: 50.4 Gy
Reference Point Dosage Given to Date: 18 Gy
Reference Point Session Dosage Given: 1.8 Gy
Session Number: 10

## 2023-09-18 LAB — URINALYSIS, COMPLETE (UACMP) WITH MICROSCOPIC
Bilirubin Urine: NEGATIVE
Glucose, UA: NEGATIVE mg/dL
Ketones, ur: NEGATIVE mg/dL
Nitrite: NEGATIVE
Protein, ur: NEGATIVE mg/dL
Specific Gravity, Urine: 1.025 (ref 1.005–1.030)
WBC, UA: >50 WBC/hpf (ref 0–5)
pH: 6 (ref 5.0–8.0)

## 2023-09-18 LAB — CBC WITH DIFFERENTIAL (CANCER CENTER ONLY)
Abs Immature Granulocytes: 0.01 10*3/uL (ref 0.00–0.07)
Basophils Absolute: 0 10*3/uL (ref 0.0–0.1)
Basophils Relative: 0 %
Eosinophils Absolute: 0.1 10*3/uL (ref 0.0–0.5)
Eosinophils Relative: 4 %
HCT: 30.7 % — ABNORMAL LOW (ref 36.0–46.0)
Hemoglobin: 10.1 g/dL — ABNORMAL LOW (ref 12.0–15.0)
Immature Granulocytes: 0 %
Lymphocytes Relative: 13 %
Lymphs Abs: 0.4 10*3/uL — ABNORMAL LOW (ref 0.7–4.0)
MCH: 27.9 pg (ref 26.0–34.0)
MCHC: 32.9 g/dL (ref 30.0–36.0)
MCV: 84.8 fL (ref 80.0–100.0)
Monocytes Absolute: 0.5 10*3/uL (ref 0.1–1.0)
Monocytes Relative: 14 %
Neutro Abs: 2.2 10*3/uL (ref 1.7–7.7)
Neutrophils Relative %: 69 %
Platelet Count: 121 10*3/uL — ABNORMAL LOW (ref 150–400)
RBC: 3.62 MIL/uL — ABNORMAL LOW (ref 3.87–5.11)
RDW: 12.8 % (ref 11.5–15.5)
WBC Count: 3.3 10*3/uL — ABNORMAL LOW (ref 4.0–10.5)
nRBC: 0 % (ref 0.0–0.2)

## 2023-09-18 LAB — MAGNESIUM: Magnesium: 1.6 mg/dL — ABNORMAL LOW (ref 1.7–2.4)

## 2023-09-18 LAB — CMP (CANCER CENTER ONLY)
ALT: 21 U/L (ref 0–44)
AST: 21 U/L (ref 15–41)
Albumin: 4.1 g/dL (ref 3.5–5.0)
Alkaline Phosphatase: 67 U/L (ref 38–126)
Anion gap: 11 (ref 5–15)
BUN: 17 mg/dL (ref 8–23)
CO2: 24 mmol/L (ref 22–32)
Calcium: 9.3 mg/dL (ref 8.9–10.3)
Chloride: 104 mmol/L (ref 98–111)
Creatinine: 0.99 mg/dL (ref 0.44–1.00)
GFR, Estimated: 60 mL/min — ABNORMAL LOW (ref >60)
Glucose, Bld: 100 mg/dL — ABNORMAL HIGH (ref 70–99)
Potassium: 4.1 mmol/L (ref 3.5–5.1)
Sodium: 138 mmol/L (ref 135–145)
Total Bilirubin: 0.2 mg/dL (ref 0.0–1.2)
Total Protein: 6 g/dL — ABNORMAL LOW (ref 6.5–8.1)

## 2023-09-18 MED ORDER — POTASSIUM CHLORIDE IN NACL 20-0.9 MEQ/L-% IV SOLN
Freq: Once | INTRAVENOUS | Status: AC
  Filled 2023-09-18: qty 1000

## 2023-09-18 MED ORDER — HEPARIN SOD (PORK) LOCK FLUSH 100 UNIT/ML IV SOLN
500.0000 [IU] | Freq: Once | INTRAVENOUS | Status: AC | PRN
  Administered 2023-09-18: 500 [IU]

## 2023-09-18 MED ORDER — APREPITANT 130 MG/18ML IV EMUL
130.0000 mg | Freq: Once | INTRAVENOUS | Status: AC
  Administered 2023-09-18: 130 mg via INTRAVENOUS
  Filled 2023-09-18: qty 18

## 2023-09-18 MED ORDER — DEXAMETHASONE SODIUM PHOSPHATE 10 MG/ML IJ SOLN
10.0000 mg | Freq: Once | INTRAMUSCULAR | Status: AC
  Administered 2023-09-18: 10 mg via INTRAVENOUS
  Filled 2023-09-18: qty 1

## 2023-09-18 MED ORDER — SODIUM CHLORIDE 0.9% FLUSH
10.0000 mL | INTRAVENOUS | Status: DC | PRN
  Administered 2023-09-18: 10 mL

## 2023-09-18 MED ORDER — SODIUM CHLORIDE 0.9 % IV SOLN
40.0000 mg/m2 | Freq: Once | INTRAVENOUS | Status: AC
  Administered 2023-09-18: 83 mg via INTRAVENOUS
  Filled 2023-09-18: qty 83

## 2023-09-18 MED ORDER — MAGNESIUM SULFATE 2 GM/50ML IV SOLN
2.0000 g | Freq: Once | INTRAVENOUS | Status: AC
  Administered 2023-09-18: 2 g via INTRAVENOUS
  Filled 2023-09-18: qty 50

## 2023-09-18 MED ORDER — PALONOSETRON HCL INJECTION 0.25 MG/5ML
0.2500 mg | Freq: Once | INTRAVENOUS | Status: AC
  Administered 2023-09-18: 0.25 mg via INTRAVENOUS
  Filled 2023-09-18: qty 5

## 2023-09-18 MED ORDER — SODIUM CHLORIDE 0.9 % IV SOLN: INTRAVENOUS | Status: DC

## 2023-09-18 NOTE — Patient Instructions (Signed)
 Cisplatin Injection What is this medication? CISPLATIN (SIS pla tin) treats some types of cancer. It works by slowing down the growth of cancer cells. This medicine may be used for other purposes; ask your health care provider or pharmacist if you have questions. COMMON BRAND NAME(S): Platinol, Platinol -AQ What should I tell my care team before I take this medication? They need to know if you have any of these conditions: Eye disease, vision problems Hearing problems Kidney disease Low blood counts, such as low white cells, platelets, or red blood cells Tingling of the fingers or toes, or other nerve disorder An unusual or allergic reaction to cisplatin, carboplatin, oxaliplatin, other medications, foods, dyes, or preservatives If you or your partner are pregnant or trying to get pregnant Breast-feeding How should I use this medication? This medication is injected into a vein. It is given by your care team in a hospital or clinic setting. Talk to your care team about the use of this medication in children. Special care may be needed. Overdosage: If you think you have taken too much of this medicine contact a poison control center or emergency room at once. NOTE: This medicine is only for you. Do not share this medicine with others. What if I miss a dose? Keep appointments for follow-up doses. It is important not to miss your dose. Call your care team if you are unable to keep an appointment. What may interact with this medication? Do not take this medication with any of the following: Live virus vaccines This medication may also interact with the following: Certain antibiotics, such as amikacin, gentamicin, neomycin, polymyxin B, streptomycin, tobramycin, vancomycin Foscarnet This list may not describe all possible interactions. Give your health care provider a list of all the medicines, herbs, non-prescription drugs, or dietary supplements you use. Also tell them if you smoke, drink  alcohol, or use illegal drugs. Some items may interact with your medicine. What should I watch for while using this medication? Your condition will be monitored carefully while you are receiving this medication. You may need blood work done while taking this medication. This medication may make you feel generally unwell. This is not uncommon, as chemotherapy can affect healthy cells as well as cancer cells. Report any side effects. Continue your course of treatment even though you feel ill unless your care team tells you to stop. This medication may increase your risk of getting an infection. Call your care team for advice if you get a fever, chills, sore throat, or other symptoms of a cold or flu. Do not treat yourself. Try to avoid being around people who are sick. Avoid taking medications that contain aspirin, acetaminophen, ibuprofen, naproxen, or ketoprofen unless instructed by your care team. These medications may hide a fever. This medication may increase your risk to bruise or bleed. Call your care team if you notice any unusual bleeding. Be careful brushing or flossing your teeth or using a toothpick because you may get an infection or bleed more easily. If you have any dental work done, tell your dentist you are receiving this medication. Drink fluids as directed while you are taking this medication. This will help protect your kidneys. Call your care team if you get diarrhea. Do not treat yourself. Talk to your care team if you or your partner wish to become pregnant or think you might be pregnant. This medication can cause serious birth defects if taken during pregnancy and for 14 months after the last dose. A negative pregnancy  test is required before starting this medication. A reliable form of contraception is recommended while taking this medication and for 14 months after the last dose. Talk to your care team about effective forms of contraception. Do not father a child while taking this  medication and for 11 months after the last dose. Use a condom during sex during this time period. Do not breast-feed while taking this medication. This medication may cause infertility. Talk to your care team if you are concerned about your fertility. What side effects may I notice from receiving this medication? Side effects that you should report to your care team as soon as possible: Allergic reactions--skin rash, itching, hives, swelling of the face, lips, tongue, or throat Eye pain, change in vision, vision loss Hearing loss, ringing in ears Infection--fever, chills, cough, sore throat, wounds that don't heal, pain or trouble when passing urine, general feeling of discomfort or being unwell Kidney injury--decrease in the amount of urine, swelling of the ankles, hands, or feet Low red blood cell level--unusual weakness or fatigue, dizziness, headache, trouble breathing Painful swelling, warmth, or redness of the skin, blisters or sores at the infusion site Pain, tingling, or numbness in the hands or feet Unusual bruising or bleeding Side effects that usually do not require medical attention (report to your care team if they continue or are bothersome): Hair loss Nausea Vomiting This list may not describe all possible side effects. Call your doctor for medical advice about side effects. You may report side effects to FDA at 1-800-FDA-1088. Where should I keep my medication? This medication is given in a hospital or clinic. It will not be stored at home. NOTE: This sheet is a summary. It may not cover all possible information. If you have questions about this medicine, talk to your doctor, pharmacist, or health care provider.  2024 [employer]vier/Gold Standard (2021-07-16 00:00:00)

## 2023-09-18 NOTE — Assessment & Plan Note (Signed)
 Mild hypomagnesemia likely multifactorial due to diarrhea and cisplatin .  Will give additional IV  magnesium  today.  She knows to control her diarrhea with medication.

## 2023-09-18 NOTE — Progress Notes (Signed)
 [facility]  [address] 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 There was lymphovascular invasion and carcinoma in situ at the 6 o'clock margin with < 1 mm margin for invasive carcinoma. PET scan revealed metastatic left inguinal lymph nodes up to 1.8 cm in diameter, but no additional evidence of metastatic disease in the neck, chest, abdomen or pelvis. There was a 4 mm right lower lobe nodule, too small for PET resolution.   We plan concurrent chemoradiation with cisplatin . She will also have immunotherapy with pembrolizuab since her PDL 1 score is 85%, and continue pembrolizumab  maintenance after the other is completed.  She started chemoradiation with weekly cisplatin  and pembrolizumab  every 3 weeks on June 9.  She has had some difficulty tolerating treatment due to nausea, vomiting and diarrhea, currently controlled with medications.  We have recommended additional IV fluids weekly on Thursdays.  EKG was done today for an irregularly irregular rhythm on examination, but revealed normal sinus rhythm.  Her magnesium  is borderline low, so we will give additional IV magnesium  today.  She has new pancytopenia likely due to treatment.  She will proceed with her third cycle of weekly cisplatin  we will plan to see her back in 1 week with a CBC, comprehensive metabolic panel and magnesium  prior to fourth cycle of cisplatin , at which time she will be due for pembrolizumab .  Urinary  incontinence Episodes of urinary incontinence, this may be due to infection versus radiation.  I will obtain urinalysis and urine culture today for further evaluation.  Hypomagnesemia Mild hypomagnesemia likely multifactorial due to diarrhea and cisplatin .  Will give additional IV  magnesium  today.  She knows to control her diarrhea with medication.    The patient understands the plans discussed today and is in agreement with them.  She knows to contact our office if she develops concerns prior to her next appointment.   I provided 40 minutes of face-to-face time during this encounter and > 50% was spent counseling as documented under my assessment and plan.    Doris DELENA Foy, PA-C  [facility] [place] - A DEPT OF [facility] [address] [place] [address] Dept: [phone] Dept Fax: [phone]   Orders Placed This Encounter  Procedures   Urine Culture    Standing Status:   Future    Number of Occurrences:   1    Expected Date:   09/18/2023    Expiration Date:   12/17/2023   Urinalysis, Complete w Microscopic    Standing Status:   Future    Number of Occurrences:   1    Expected Date:   09/18/2023    Expiration Date:   12/17/2023   CMP (Cancer Center only)    Standing Status:   Future    Number of Occurrences:   1    Expected Date:   09/18/2023    Expiration Date:   12/17/2023   EKG 12-Lead  Standing Status:   Future    Number of Occurrences:   1    Expected Date:   09/18/2023    Expiration Date:   12/17/2023      CHIEF COMPLAINT:  CC: Stage IIIC vulvar cancer  Current Treatment: Concurrent chemoradiation with weekly cisplatin  and pembrolizumab  every 3 weeks  HISTORY OF PRESENT ILLNESS:   Oncology History  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)  06/08/2021 Initial Diagnosis   Breast cancer in female [name]   07/23/2021 Genetic Testing   Negative hereditary cancer genetic testing: no pathogenic variants  detected in [facility] BRCAPlus Panel. Report date is July 23, 2021.  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  The report date is Jul 26, 2021.  The BRCAplus panel offered by [employer] and includes sequencing and deletion/duplication analysis for the following 8 genes: ATM, BRCA1, BRCA2, CDH1, CHEK2, PALB2, PTEN, and TP53.  Results of pan-cancer panel pending.   The CancerNext-Expanded gene panel offered by [facility] and includes sequencing and rearrangement analysis for the following 77 genes: AIP, ALK, APC*, ATM*, AXIN2, BAP1, BARD1, BLM, BMPR1A, BRCA1*, BRCA2*, BRIP1*, CDC73, CDH1*, CDK4, CDKN1B, CDKN2A, CHEK2*, CTNNA1, DICER1, FANCC, FH, FLCN, GALNT12, KIF1B, LZTR1, MAX, MEN1, MET, MLH1*, MSH2*, MSH3, MSH6*, MUTYH*, NBN, NF1*, NF2, NTHL1, PALB2*, PHOX2B, PMS2*, POT1, PRKAR1A, PTCH1, PTEN*, RAD51C*, RAD51D*, RB1, RECQL, RET, SDHA, SDHAF2, SDHB, SDHC, SDHD, SMAD4, SMARCA4, SMARCB1, SMARCE1, STK11, SUFU, TMEM127, TP53*, TSC1, TSC2, VHL and XRCC2 (sequencing and deletion/duplication); EGFR, EGLN1, HOXB13, KIT, MITF, PDGFRA, POLD1, and POLE (sequencing only); EPCAM and GREM1 (deletion/duplication only). DNA and RNA analyses performed for * genes.    08/23/2021 Cancer Staging   Staging form: Breast, AJCC 8th Edition - Pathologic stage from 08/23/2021: Stage IA (pT2(2), pN0(sn), cM0, G1, ER+, PR+, HER2-) - Signed by Doris Wanda DEL, MD on 09/29/2021 Histopathologic type: Lobular carcinoma, NOS Stage prefix: Initial diagnosis Method of lymph node assessment: Sentinel lymph node biopsy Nuclear grade: G1 Multigene prognostic tests performed: EndoPredict Histologic grading system: 3 grade system Residual tumor (R): R0 - None Laterality: Right Tumor size (mm): 24 Multiple tumors: Yes Number of tumors: 2 Lymph-vascular invasion (LVI): LVI not present (absent)/not  identified Diagnostic confirmation: Positive histology PLUS positive immunophenotyping and/or positive genetic studies Specimen type: Excision Staged by: Managing physician Menopausal status: Postmenopausal Ki-67 (%): 5 Stage used in treatment planning: Yes National guidelines used in treatment planning: Yes Type of national guideline used in treatment planning: NCCN   Vulvar cancer (HCC)  06/28/2023 Initial Diagnosis   Vulvar cancer (HCC)   06/28/2023 Cancer Staging   Staging form: Vulva, AJCC V9 - Clinical stage from 06/28/2023: FIGO Stage IIIC (cT1b, cN1c, cM0) - Signed by Doris Wanda DEL, MD on 07/31/2023 Histopathologic type: Squamous cell carcinoma, NOS Stage prefix: Initial diagnosis Method of lymph node assessment: Lymph node dissection Histologic grade (G): G2 Histologic grading system: 3 grade system Tumor size (mm): 30 Lymph-vascular invasion (LVI): LVI present/identified, NOS Diagnostic confirmation: Positive histology Specimen type: Excision Staged by: Managing physician Femoral-inguinal nodal status: Positive Solitary (s) or multifocal (m) tumors in the primary site: Solitary Perineural invasion (PNI): Unknown Stage used in treatment planning: Yes National guidelines used in treatment planning: Yes Type of national guideline used in treatment planning: NCCN   09/04/2023 -  Chemotherapy   Patient is on Treatment Plan : CERVICAL Cisplatin  (40) q7d + Pembrolizumab  q21d + XRT  10/16/2023 -  Chemotherapy   Patient is on Treatment Plan : CERVICAL Pembrolizumab  (200) q21d         INTERVAL HISTORY:   Doris Reyes is here today for repeat clinical assessment prior to third cycle of weekly cisplatin . She reports fatigue causing her to be less active than previously.  She reports nausea/vomiting after chemotherapy and diarrhea after chemotherapy. Compazine  and Zofran  caused blurry vision, so she was switched to Ativan . Started weekly IV fluids Thursday.  Imodium slow to work,  so missed XRT Friday.  She reports taste changes, and water especially tastes bad.  She adding fruit to water, as well as using liquid IV.   She is using milk of magnesia sitz bath's and applying radiation gel to her skin.  She has had urinary incontinence, but denies dysuria, frequency or urgency of urination.  She reports shortness of breath with exertion and occasional dizziness.  She denies reports intermittent chills and sweats, but denies fevers. She denies pain. Her appetite is decreased. Her weight has been stable.   REVIEW OF SYSTEMS:   Review of Systems  Constitutional:  Positive for chills and diaphoresis. Negative for appetite change, fatigue, fever and unexpected weight change.  HENT:   Negative for lump/mass, mouth sores and sore throat.   Eyes:  Negative for eye problems.  Respiratory:  Positive for shortness of breath. Negative for cough.   Cardiovascular:  Negative for chest pain and leg swelling.  Gastrointestinal:  Positive for diarrhea and nausea. Negative for abdominal pain, blood in stool, constipation and vomiting.  Genitourinary:  Positive for bladder incontinence. Negative for difficulty urinating, dysuria, frequency, hematuria, vaginal bleeding and vaginal discharge.   Musculoskeletal:  Negative for arthralgias, back pain, gait problem and myalgias.  Skin:  Positive for itching (vulva). Negative for rash.  Neurological:  Positive for dizziness (intermittent). Negative for extremity weakness, gait problem, headaches and numbness.  Hematological:  Negative for adenopathy. Does not bruise/bleed easily.  Psychiatric/Behavioral:  Negative for depression and sleep disturbance. The patient is not nervous/anxious.      VITALS:   Blood pressure (!) 155/90, pulse 72, temperature 97.9 F (36.6 C), temperature source Oral, resp. rate 18, height 5' 3 (1.6 m), weight 214 lb 6.4 oz (97.3 kg), SpO2 100%.  Wt Readings from Last 3 Encounters:  09/18/23 214 lb 6.4 oz (97.3 kg)   09/11/23 214 lb 1.6 oz (97.1 kg)  09/04/23 213 lb 0.6 oz (96.6 kg)    Body mass index is 37.98 kg/m.  Performance status (ECOG): 2 - Symptomatic, <50% confined to bed    PHYSICAL EXAM:   Physical Exam Vitals and nursing note reviewed.  Constitutional:      General: She is not in acute distress.    Appearance: Normal appearance.  HENT:     Head: Normocephalic and atraumatic.     Mouth/Throat:     Mouth: Mucous membranes are moist.     Pharynx: Oropharynx is clear. No oropharyngeal exudate or posterior oropharyngeal erythema.   Eyes:     General: No scleral icterus.    Extraocular Movements: Extraocular movements intact.     Conjunctiva/sclera: Conjunctivae normal.     Pupils: Pupils are equal, round, and reactive to light.    Cardiovascular:     Rate and Rhythm: Normal rate and regular rhythm.     Heart sounds: Normal heart sounds. No murmur heard.    No friction rub. No gallop.  Pulmonary:     Effort: Pulmonary effort is normal.  Breath sounds: Normal breath sounds. No wheezing, rhonchi or rales.  Abdominal:     General: There is no distension.     Palpations: Abdomen is soft. There is no hepatomegaly, splenomegaly or mass.     Tenderness: There is no abdominal tenderness.  Genitourinary:    Labia:        Right: No lesion.        Left: No lesion.      Comments: There is no vaginal discharge.    Musculoskeletal:        General: Normal range of motion.     Cervical back: Normal range of motion and neck supple. No tenderness.     Right lower leg: No edema.     Left lower leg: No edema.  Lymphadenopathy:     Cervical: No cervical adenopathy.     Upper Body:     Right upper body: No supraclavicular or axillary adenopathy.     Left upper body: No supraclavicular or axillary adenopathy.     Lower Body: No right inguinal adenopathy. No left inguinal adenopathy.   Skin:    General: Skin is warm and dry.     Coloration: Skin is not jaundiced.     Findings: No  rash.   Neurological:     Mental Status: She is alert and oriented to person, place, and time.     Cranial Nerves: No cranial nerve deficit.   Psychiatric:        Mood and Affect: Mood normal.        Behavior: Behavior normal.        Thought Content: Thought content normal.     LABS:      Latest Ref Rng & Units 09/18/2023    8:15 AM 09/11/2023    8:44 AM 09/01/2023    2:31 PM  CBC  WBC 4.0 - 10.5 K/uL 3.3  4.2  5.3   Hemoglobin 12.0 - 15.0 g/dL 89.8  88.2  87.7   Hematocrit 36.0 - 46.0 % 30.7  36.5  37.0   Platelets 150 - 400 K/uL 121  213  211     Latest Reference Range & Units 09/18/23 08:15  Neutrophils % 69  Lymphocytes % 13  Monocytes Relative % 14  Eosinophil % 4  Basophil % 0  Immature Granulocytes % 0  NEUT# 1.7 - 7.7 K/uL 2.2  Lymphs Abs 0.7 - 4.0 K/uL 0.4 (L)  Monocyte # 0.1 - 1.0 K/uL 0.5  Eosinophils Absolute 0.0 - 0.5 K/uL 0.1  Basophils Absolute 0.0 - 0.1 K/uL 0.0  (L): Data is abnormally low    Latest Ref Rng & Units 09/18/2023    8:15 AM 09/11/2023    8:45 AM 09/01/2023    2:31 PM  CMP  Glucose 70 - 99 mg/dL 899  889  884   BUN 8 - 23 mg/dL 17  20  17    Creatinine 0.44 - 1.00 mg/dL 9.00  8.91  9.01   Sodium 135 - 145 mmol/L 138  137  139   Potassium 3.5 - 5.1 mmol/L 4.1  4.6  4.0   Chloride 98 - 111 mmol/L 104  100  105   CO2 22 - 32 mmol/L 24  26  23    Calcium  8.9 - 10.3 mg/dL 9.3  89.8  9.8   Total Protein 6.5 - 8.1 g/dL 6.0  6.6  6.5   Total Bilirubin 0.0 - 1.2 mg/dL 0.2  0.2  <9.7   Alkaline Phos 38 -  126 U/L 67  70  74   AST 15 - 41 U/L 21  22  17    ALT 0 - 44 U/L 21  26  17       No results found for: CEA1, CEA / No results found for: CEA1, CEA No results found for: PSA1 No results found for: CAN199 No results found for: CAN125  No results found for: TOTALPROTELP, ALBUMINELP, A1GS, A2GS, BETS, BETA2SER, GAMS, MSPIKE, SPEI No results found for: TIBC, FERRITIN, IRONPCTSAT No results found for:  LDH  STUDIES:   No results found.    HISTORY:   Past Medical History:  Diagnosis Date   Anemia    Anxiety    Cancer (HCC)    right breast ILC   Complication of anesthesia    difficulty waking up   Depression    Diverticulitis    Family history of breast cancer    Family history of ovarian cancer    Family history of prostate cancer    Fibromyalgia    GERD (gastroesophageal reflux disease)    Heart murmur    History of colonic polyps    History of hiatal hernia    Hyperlipidemia    Hypertension    IBS (irritable bowel syndrome)    Ischemic colitis (HCC) 03/2012   Osteoporosis    Pre-diabetes    RLS (restless legs syndrome)    Sinusitis    Sleep apnea    CPAP nightly    Past Surgical History:  Procedure Laterality Date   BREAST LUMPECTOMY WITH RADIOACTIVE SEED LOCALIZATION Right 08/04/2021   Procedure: RIGHT BREAST LUMPECTOMY WITH RADIOACTIVE SEED LOCALIZATION;  Surgeon: Vanderbilt Ned, MD;  Location: [facility];  Service: General;  Laterality: Right;   CHOLECYSTECTOMY  2004   COLONOSCOPY WITH PROPOFOL  N/A 04/18/2017   Procedure: COLONOSCOPY WITH PROPOFOL ;  Surgeon: Kristie Lamprey, MD;  Location: [facility];  Service: Endoscopy;  Laterality: N/A;   ESOPHAGOGASTRODUODENOSCOPY (EGD) WITH PROPOFOL  N/A 04/18/2017   Procedure: ESOPHAGOGASTRODUODENOSCOPY (EGD) WITH PROPOFOL ;  Surgeon: Kristie Lamprey, MD;  Location: [facility];  Service: Endoscopy;  Laterality: N/A;   EXAM UNDER ANESTHESIA, PELVIC N/A 06/28/2023   Procedure: EXAM UNDER ANESTHESIA, PELVIC;  Surgeon: Viktoria Comer SAUNDERS, MD;  Location: [facility];  Service: Gynecology;  Laterality: N/A;   FLEXIBLE SIGMOIDOSCOPY  04/20/2012   Procedure: FLEXIBLE SIGMOIDOSCOPY;  Surgeon: Belvie JONETTA Just, MD;  Location: [facility];  Service: Endoscopy;  Laterality: N/A;   INGUINAL LYMPHADENECTOMY N/A 06/28/2023   Procedure: LYMPHADENECTOMY, INGUINAL, OPEN;  Surgeon: Viktoria Comer SAUNDERS, MD;  Location: [facility];  Service: Gynecology;   Laterality: N/A;   IR IMAGING GUIDED PORT INSERTION  07/31/2023   NISSEN FUNDOPLICATION  2011   PARAESOPHAGEAL HERNIA REPAIR  09/11/2009   and Nissen fundoplication   RADICAL VULVECTOMY N/A 06/28/2023   Procedure: VULVECTOMY, RADICAL;  Surgeon: Viktoria Comer SAUNDERS, MD;  Location: [facility];  Service: Gynecology;  Laterality: N/A;  Possible skin flap   RE-EXCISION OF BREAST LUMPECTOMY Right 08/18/2021   Procedure: RE-EXCISION RIGHT BREAST LUMPECTOMY;  Surgeon: Vanderbilt Ned, MD;  Location: [facility];  Service: General;  Laterality: Right;   SENTINEL NODE BIOPSY N/A 08/04/2021   Procedure: SENTINEL NODE BIOPSY;  Surgeon: Vanderbilt Ned, MD;  Location: [facility];  Service: General;  Laterality: N/A;   TONSILLECTOMY  1960's   TOTAL ABDOMINAL HYSTERECTOMY  2001   w/ BSO    Family History  Problem Relation Age of Onset   Colon cancer Mother 60   Alzheimer's disease  Mother    Heart attack Father    Cancer Maternal Aunt        x2   Alzheimer's disease Maternal Aunt    Ovarian cancer Maternal Aunt    Prostate cancer Maternal Uncle    Kidney disease Maternal Uncle    Atrial fibrillation Maternal Uncle    Stroke Paternal Uncle    Aneurysm Paternal Grandmother        brain   Stroke Paternal Grandfather    Prostate cancer Cousin        paternal first cousin   Prostate cancer Cousin        paternal first cousin   Esophageal cancer Cousin        maternal first cousin   Breast cancer Cousin        DCIS, maternal first cousin   Ovarian cancer Cousin        maternal first cousin   Rheum arthritis Other    Lung disease Neg Hx     Social History:  reports that she has never smoked. She has never used smokeless tobacco. She reports that she does not drink alcohol and does not use drugs.The patient is alone today.  Allergies:  Allergies  Allergen Reactions   Ceftin [Cefuroxime Axetil] Diarrhea   Cefuroxime Other (See Comments)    IBS   Other Other (See Comments)    Pneumonia  vaccine (uncoded)   Sulfa  Antibiotics Nausea And Vomiting   Tramadol Hcl Nausea And Vomiting   Zofran  [Ondansetron ] Other (See Comments)    Vision changes    Current Medications: Current Outpatient Medications  Medication Sig Dispense Refill   LORazepam  (ATIVAN ) 1 MG tablet Take 1 tablet (1 mg total) by mouth every 8 (eight) hours. 30 tablet 0   acetaminophen  (TYLENOL ) 650 MG CR tablet 1,300 mg in the morning and at bedtime.     ALPRAZolam  (XANAX ) 0.25 MG tablet Take 0.25 mg by mouth at bedtime as needed for anxiety. (Patient not taking: Reported on 09/18/2023)     amLODipine  (NORVASC ) 5 MG tablet Take 5 mg by mouth daily.     Ascorbic Acid (VITAMIN C) 500 MG CHEW Chew 500 mg by mouth daily.     aspirin EC 81 MG tablet Take 81 mg by mouth at bedtime. Swallow whole.     atenolol  (TENORMIN ) 50 MG tablet Take 75 mg by mouth daily.      benazepril  (LOTENSIN ) 10 MG tablet Take 10 mg by mouth daily.     benzonatate (TESSALON) 100 MG capsule Take 100-200 mg by mouth at bedtime.     Biotin 1000 MCG tablet Take 1,000 mcg by mouth daily.     Calcium  Citrate-Vitamin D (CITRACAL + D PO) Take 1 tablet by mouth daily.     cetirizine (ZYRTEC) 10 MG tablet Take 10 mg by mouth at bedtime.     Cholecalciferol (VITAMIN D) 50 MCG (2000 UT) tablet Take 2,000 Units by mouth daily.     cyanocobalamin 1000 MCG tablet Take 1,000 mcg by mouth daily.     diclofenac sodium (VOLTAREN) 1 % GEL Apply 1 application  topically daily as needed (for pain).     diphenhydrAMINE  (BENADRYL ) 25 MG tablet Take 25 mg by mouth every 6 (six) hours as needed for allergies.     diphenoxylate-atropine (LOMOTIL) 2.5-0.025 MG tablet Take 2 tablets by mouth 4 (four) times daily as needed for diarrhea or loose stools.     DULoxetine  (CYMBALTA ) 60 MG capsule Take 120 mg by  mouth at bedtime.     EPINEPHrine  (EPIPEN  2-PAK) 0.3 mg/0.3 mL IJ SOAJ injection Inject 0.3 mg into the muscle as needed for anaphylaxis. (Patient not taking: Reported on  09/11/2023)     estradiol  (ESTRACE  VAGINAL) 0.1 MG/GM vaginal cream Place 1 Applicatorful vaginally 3 (three) times a week. (Patient not taking: Reported on 09/18/2023) 42.5 g 12   famotidine  (PEPCID ) 20 MG tablet Take 20 mg by mouth at bedtime.     fexofenadine (ALLEGRA) 180 MG tablet Take 180 mg by mouth in the morning.     fluticasone  (FLONASE ) 50 MCG/ACT nasal spray Place 1 spray into both nostrils daily.     lidocaine  (XYLOCAINE ) 5 % ointment Apply 1 Application topically 2 (two) times daily as needed for moderate pain (pain score 4-6) (to the vulva). Apply to vulva as needed for discomfort 35.44 g 3   lidocaine -prilocaine  (EMLA ) cream Apply to affected area once 30 g 3   loratadine  (CLARITIN ) 10 MG tablet Take 10 mg by mouth daily. (Patient not taking: Reported on 09/18/2023)     NON FORMULARY Pt uses a c-pap nightly     Nutritional Supplements (JUICE PLUS FIBRE PO) Take 2 each by mouth daily. Fruits and Vegetables     nystatin  (MYCOSTATIN /NYSTOP ) powder Apply 1 Application topically 3 (three) times daily. 30 g 5   Omega-3 Fatty Acids (FISH OIL) 1000 MG CAPS Take 1,000 mg by mouth daily.      pantoprazole  (PROTONIX ) 40 MG tablet Take 40 mg by mouth every evening.     Phenylephrine -APAP-Guaifenesin (TYLENOL  SINUS SEVERE PO) Take 2 tablets by mouth daily as needed (drainage).     polyvinyl alcohol (LIQUIFILM TEARS) 1.4 % ophthalmic solution Place 1 drop into both eyes 5 (five) times daily.     Probiotic Product (PROBIOTIC & ACIDOPHILUS EX ST PO) Take 1 capsule by mouth daily. Ultra Flora Plus Capsules     prochlorperazine  (COMPAZINE ) 10 MG tablet Take 1 tablet (10 mg total) by mouth every 6 (six) hours as needed for nausea or vomiting. 30 tablet 1   Propylene Glycol (SYSTANE COMPLETE) 0.6 % SOLN Place 1 drop into both eyes in the morning and at bedtime.     rOPINIRole  (REQUIP ) 0.5 MG tablet Take 0.5 mg by mouth at bedtime.     senna (SENOKOT) 8.6 MG tablet 2 tablets at bedtime as needed Orally  Once a day     senna-docusate (SENOKOT-S) 8.6-50 MG tablet Take 2 tablets by mouth at bedtime. For AFTER surgery, do not take if having diarrhea 30 tablet 0   simvastatin  (ZOCOR ) 10 MG tablet Take 10 mg by mouth at bedtime.       sucralfate  (CARAFATE ) 1 GM/10ML suspension Take 1 g by mouth 2 (two) times daily.     traZODone  (DESYREL ) 50 MG tablet Take 50 mg by mouth at bedtime.     triamcinolone  (KENALOG ) 0.025 % ointment Apply 1 Application topically 2 (two) times daily. 30 g 0   vitamin E 400 UNIT capsule Take 400 Units by mouth daily.       White Petrolatum -Mineral Oil (REFRESH P.M. OP) Place 1 Application into both eyes at bedtime.     No current facility-administered medications for this visit.   Facility-Administered Medications Ordered in Other Visits  Medication Dose Route Frequency Provider Last Rate Last Admin   0.9 %  sodium chloride  infusion   Intravenous Continuous Doris Wanda DEL, MD   Stopped at 09/18/23 1539   sodium chloride  flush (NS) 0.9 %  injection 10 mL  10 mL Intracatheter PRN Doris Wanda DEL, MD   10 mL at 09/18/23 1541

## 2023-09-18 NOTE — Assessment & Plan Note (Signed)
 Episodes of urinary incontinence, this may be due to infection versus radiation.  I will obtain urinalysis and urine culture today for further evaluation.

## 2023-09-18 NOTE — Assessment & Plan Note (Addendum)
 She was treated with pelvic vulvectomy, bilateral debulking, radical lymphadenectomy, and inguinal dissection done June 28, 2023, with 3/4 positive nodes on the left side, up to 3 cm and with extensive extranodal tumor. 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 There was lymphovascular invasion and carcinoma in situ at the 6 o'clock margin with < 1 mm margin for invasive carcinoma. PET scan revealed metastatic left inguinal lymph nodes up to 1.8 cm in diameter, but no additional evidence of metastatic disease in the neck, chest, abdomen or pelvis. There was a 4 mm right lower lobe nodule, too small for PET resolution.   We plan concurrent chemoradiation with cisplatin . She will also have immunotherapy with pembrolizuab since her PDL 1 score is 85%, and continue pembrolizumab  maintenance after the other is completed.  She started chemoradiation with weekly cisplatin  and pembrolizumab  every 3 weeks on June 9.  She has had some difficulty tolerating treatment due to nausea, vomiting and diarrhea, currently controlled with medications.  We have recommended additional IV fluids weekly on Thursdays.  EKG was done today for an irregularly irregular rhythm on examination, but revealed normal sinus rhythm.  Her magnesium  is borderline low, so we will give additional IV magnesium  today.  She has new pancytopenia likely due to treatment.  She will proceed with her third cycle of weekly cisplatin  we will plan to see her back in 1 week with a CBC, comprehensive metabolic panel and magnesium  prior to fourth cycle of cisplatin , at which time she will be due for pembrolizumab .

## 2023-09-19 ENCOUNTER — Ambulatory Visit: Admitting: Oncology

## 2023-09-19 ENCOUNTER — Other Ambulatory Visit: Payer: Self-pay

## 2023-09-19 ENCOUNTER — Ambulatory Visit

## 2023-09-19 ENCOUNTER — Ambulatory Visit
Admission: RE | Admit: 2023-09-19 | Discharge: 2023-09-19 | Disposition: A | Discharge status: Home / Self Care | Source: Ambulatory Visit | Attending: Radiation Oncology | Admitting: Radiation Oncology

## 2023-09-19 ENCOUNTER — Telehealth: Payer: Self-pay

## 2023-09-19 ENCOUNTER — Other Ambulatory Visit

## 2023-09-19 DIAGNOSIS — Z853 Personal history of malignant neoplasm of breast: Secondary | ICD-10-CM | POA: Diagnosis not present

## 2023-09-19 DIAGNOSIS — C50111 Malignant neoplasm of central portion of right female breast: Secondary | ICD-10-CM | POA: Diagnosis not present

## 2023-09-19 DIAGNOSIS — C774 Secondary and unspecified malignant neoplasm of inguinal and lower limb lymph nodes: Secondary | ICD-10-CM | POA: Diagnosis not present

## 2023-09-19 DIAGNOSIS — Z510 Encounter for antineoplastic radiation therapy: Secondary | ICD-10-CM | POA: Diagnosis not present

## 2023-09-19 DIAGNOSIS — Z79899 Other long term (current) drug therapy: Secondary | ICD-10-CM | POA: Diagnosis not present

## 2023-09-19 DIAGNOSIS — C519 Malignant neoplasm of vulva, unspecified: Secondary | ICD-10-CM | POA: Diagnosis not present

## 2023-09-19 LAB — URINE CULTURE: Culture: <10000 — AB

## 2023-09-19 LAB — RAD ONC ARIA SESSION SUMMARY
Course Elapsed Days: 15
Plan Fractions Treated to Date: 11
Plan Prescribed Dose Per Fraction: 1.8 Gy
Plan Total Fractions Prescribed: 28
Plan Total Prescribed Dose: 50.4 Gy
Reference Point Dosage Given to Date: 19.8 Gy
Reference Point Session Dosage Given: 1.8 Gy
Session Number: 11

## 2023-09-19 NOTE — Telephone Encounter (Signed)
-----   Message from [name] sent at 09/19/2023  3:04 PM EDT ----- Please let her know her urine had small amount of bacteria, but not considered infected. Let us  know if symptoms worsen. Thanks

## 2023-09-19 NOTE — Telephone Encounter (Signed)
 Detailed message left for patient.

## 2023-09-20 ENCOUNTER — Encounter: Payer: Self-pay | Admitting: Oncology

## 2023-09-20 ENCOUNTER — Other Ambulatory Visit: Payer: Self-pay

## 2023-09-20 ENCOUNTER — Ambulatory Visit
Admission: RE | Admit: 2023-09-20 | Discharge: 2023-09-20 | Disposition: A | Discharge status: Home / Self Care | Source: Ambulatory Visit | Attending: Radiation Oncology | Admitting: Radiation Oncology

## 2023-09-20 DIAGNOSIS — C50111 Malignant neoplasm of central portion of right female breast: Secondary | ICD-10-CM | POA: Diagnosis not present

## 2023-09-20 DIAGNOSIS — Z79899 Other long term (current) drug therapy: Secondary | ICD-10-CM | POA: Diagnosis not present

## 2023-09-20 DIAGNOSIS — C519 Malignant neoplasm of vulva, unspecified: Secondary | ICD-10-CM | POA: Diagnosis not present

## 2023-09-20 DIAGNOSIS — Z510 Encounter for antineoplastic radiation therapy: Secondary | ICD-10-CM | POA: Diagnosis not present

## 2023-09-20 DIAGNOSIS — Z853 Personal history of malignant neoplasm of breast: Secondary | ICD-10-CM | POA: Diagnosis not present

## 2023-09-20 DIAGNOSIS — C774 Secondary and unspecified malignant neoplasm of inguinal and lower limb lymph nodes: Secondary | ICD-10-CM | POA: Diagnosis not present

## 2023-09-20 LAB — RAD ONC ARIA SESSION SUMMARY
Course Elapsed Days: 16
Plan Fractions Treated to Date: 12
Plan Prescribed Dose Per Fraction: 1.8 Gy
Plan Total Fractions Prescribed: 28
Plan Total Prescribed Dose: 50.4 Gy
Reference Point Dosage Given to Date: 21.6 Gy
Reference Point Session Dosage Given: 1.8 Gy
Session Number: 12

## 2023-09-21 ENCOUNTER — Telehealth: Payer: Self-pay

## 2023-09-21 ENCOUNTER — Ambulatory Visit

## 2023-09-21 ENCOUNTER — Inpatient Hospital Stay

## 2023-09-21 ENCOUNTER — Encounter: Payer: Self-pay | Admitting: Oncology

## 2023-09-21 DIAGNOSIS — A419 Sepsis, unspecified organism: Secondary | ICD-10-CM | POA: Diagnosis not present

## 2023-09-21 DIAGNOSIS — K521 Toxic gastroenteritis and colitis: Secondary | ICD-10-CM | POA: Diagnosis not present

## 2023-09-21 DIAGNOSIS — D84821 Immunodeficiency due to drugs: Secondary | ICD-10-CM | POA: Diagnosis not present

## 2023-09-21 DIAGNOSIS — R1111 Vomiting without nausea: Secondary | ICD-10-CM | POA: Diagnosis not present

## 2023-09-21 DIAGNOSIS — I10 Essential (primary) hypertension: Secondary | ICD-10-CM | POA: Diagnosis not present

## 2023-09-21 DIAGNOSIS — R509 Fever, unspecified: Secondary | ICD-10-CM | POA: Diagnosis not present

## 2023-09-21 DIAGNOSIS — L03315 Cellulitis of perineum: Secondary | ICD-10-CM | POA: Diagnosis not present

## 2023-09-21 DIAGNOSIS — R197 Diarrhea, unspecified: Secondary | ICD-10-CM | POA: Diagnosis not present

## 2023-09-21 DIAGNOSIS — R9431 Abnormal electrocardiogram [ECG] [EKG]: Secondary | ICD-10-CM | POA: Diagnosis not present

## 2023-09-21 DIAGNOSIS — I959 Hypotension, unspecified: Secondary | ICD-10-CM | POA: Diagnosis not present

## 2023-09-21 DIAGNOSIS — T7840XA Allergy, unspecified, initial encounter: Secondary | ICD-10-CM | POA: Diagnosis not present

## 2023-09-21 DIAGNOSIS — D61810 Antineoplastic chemotherapy induced pancytopenia: Secondary | ICD-10-CM | POA: Diagnosis not present

## 2023-09-21 DIAGNOSIS — C779 Secondary and unspecified malignant neoplasm of lymph node, unspecified: Secondary | ICD-10-CM | POA: Diagnosis not present

## 2023-09-21 DIAGNOSIS — N179 Acute kidney failure, unspecified: Secondary | ICD-10-CM | POA: Diagnosis not present

## 2023-09-21 NOTE — Progress Notes (Incomplete)
 [facility]  [address] [place],  [place]  [address] [phone]  Clinic Day:  09/21/2023  Referring physician: [name], MD  ASSESSMENT & PLAN:  Assessment:    Plan: The patient understands the plans discussed today and is in agreement with them.  She knows to contact our office if she develops concerns prior to her next appointment.   I provided 40 minutes of face-to-face time during this encounter and > 50% was spent counseling as documented under my assessment and plan.   Doris VEAR Cornish, MD   CANCER CENTER [facility] - A DEPT OF [facility] [address] [place] [address] Dept: [phone] Dept Fax: [phone]   No orders of the defined types were placed in this encounter.   CHIEF COMPLAINT:  CC: Stage IIIC vulvar cancer  Current Treatment: Concurrent chemoradiation with weekly cisplatin  and pembrolizumab  every 3 weeks  HISTORY OF PRESENT ILLNESS:   Oncology History  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)  06/08/2021 Initial Diagnosis   Breast cancer in female Doris Reyes)   07/23/2021 Genetic Testing   Negative hereditary cancer genetic testing: no pathogenic variants detected in [facility] BRCAPlus Panel. Report date is July 23, 2021.  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  The report date is Jul 26, 2021.  The BRCAplus panel offered by [employer] and includes sequencing and deletion/duplication analysis for the following 8 genes: ATM, BRCA1, BRCA2, CDH1, CHEK2, PALB2, PTEN, and TP53.  Results of pan-cancer panel pending.   The CancerNext-Expanded gene panel offered by [facility] and includes sequencing and rearrangement analysis for the following 77 genes: AIP, ALK, APC*, ATM*, AXIN2, BAP1, BARD1, BLM, BMPR1A, BRCA1*, BRCA2*, BRIP1*, CDC73, CDH1*,  CDK4, CDKN1B, CDKN2A, CHEK2*, CTNNA1, DICER1, FANCC, FH, FLCN, GALNT12, KIF1B, LZTR1, MAX, MEN1, MET, MLH1*, MSH2*, MSH3, MSH6*, MUTYH*, NBN, NF1*, NF2, NTHL1, PALB2*, PHOX2B, PMS2*, POT1, PRKAR1A, PTCH1, PTEN*, RAD51C*, RAD51D*, RB1, RECQL, RET, SDHA, SDHAF2, SDHB, SDHC, SDHD, SMAD4, SMARCA4, SMARCB1, SMARCE1, STK11, SUFU, TMEM127, TP53*, TSC1, TSC2, VHL and XRCC2 (sequencing and deletion/duplication); EGFR, EGLN1, HOXB13, KIT, MITF, PDGFRA, POLD1, and POLE (sequencing only); EPCAM and GREM1 (deletion/duplication only). DNA and RNA analyses performed for * genes.    08/23/2021 Cancer Staging   Staging form: Breast, AJCC 8th Edition - Pathologic stage from 08/23/2021: Stage IA (pT2(2), pN0(sn), cM0, G1, ER+, PR+, HER2-) - Signed by Reyes Doris VEAR, MD on 09/29/2021 Histopathologic type: Lobular carcinoma, NOS Stage prefix: Initial diagnosis Method of lymph node assessment: Sentinel lymph node biopsy Nuclear grade: G1 Multigene prognostic tests performed: EndoPredict Histologic grading system: 3 grade system Residual tumor (R): R0 - None Laterality: Right Tumor size (mm): 24 Multiple tumors: Yes Number of tumors: 2 Lymph-vascular invasion (LVI): LVI not present (absent)/not identified Diagnostic confirmation: Positive histology PLUS positive immunophenotyping and/or positive genetic studies Specimen type: Excision Staged by: Managing physician Menopausal status: Postmenopausal Ki-67 (%): 5 Stage used in treatment planning: Yes National guidelines used in treatment planning: Yes Type of national guideline used in treatment planning: NCCN   Vulvar cancer (HCC)  06/28/2023 Initial Diagnosis   Vulvar cancer (HCC)   06/28/2023 Cancer Staging   Staging form: Vulva, AJCC V9 - Clinical stage from 06/28/2023: FIGO Stage IIIC (cT1b, cN1c, cM0) - Signed by Reyes Doris VEAR, MD on 07/31/2023 Histopathologic type: Squamous cell carcinoma, NOS Stage prefix: Initial diagnosis Method of  lymph node  assessment: Lymph node dissection Histologic grade (G): G2 Histologic grading system: 3 grade system Tumor size (mm): 30 Lymph-vascular invasion (LVI): LVI present/identified, NOS Diagnostic confirmation: Positive histology Specimen type: Excision Staged by: Managing physician Femoral-inguinal nodal status: Positive Solitary (s) or multifocal (m) tumors in the primary site: Solitary Perineural invasion (PNI): Unknown Stage used in treatment planning: Yes National guidelines used in treatment planning: Yes Type of national guideline used in treatment planning: NCCN   09/04/2023 -  Chemotherapy   Patient is on Treatment Plan : CERVICAL Cisplatin  (40) q7d + Pembrolizumab  q21d + XRT     10/16/2023 -  Chemotherapy   Patient is on Treatment Plan : CERVICAL Pembrolizumab  (200) q21d         INTERVAL HISTORY:   Doris Reyes is here today for repeat clinical assessment prior to third cycle of weekly cisplatin . She reports fatigue causing her to be less active than previously.  She reports nausea/vomiting after chemotherapy and diarrhea after chemotherapy. Compazine  and Zofran  caused blurry vision, so she was switched to Ativan . Started weekly IV fluids Thursday.  Imodium slow to work, so missed XRT Friday.  She reports taste changes, and water especially tastes bad.  She adding fruit to water, as well as using liquid IV.   She is using milk of magnesia sitz bath's and applying radiation gel to her skin.  She has had urinary incontinence, but denies dysuria, frequency or urgency of urination.  She reports shortness of breath with exertion and occasional dizziness.  She denies reports intermittent chills and sweats, but denies fevers. She denies pain. Her appetite is decreased. Her weight has been stable.   REVIEW OF SYSTEMS:   Review of Systems  Constitutional:  Positive for chills and diaphoresis. Negative for appetite change, fatigue, fever and unexpected weight change.  HENT:   Negative for  lump/mass, mouth sores and sore throat.   Eyes:  Negative for eye problems.  Respiratory:  Positive for shortness of breath. Negative for cough.   Cardiovascular:  Negative for chest pain and leg swelling.  Gastrointestinal:  Positive for diarrhea and nausea. Negative for abdominal pain, blood in stool, constipation and vomiting.  Genitourinary:  Positive for bladder incontinence. Negative for difficulty urinating, dysuria, frequency, hematuria, vaginal bleeding and vaginal discharge.   Musculoskeletal:  Negative for arthralgias, back pain, gait problem and myalgias.  Skin:  Positive for itching (vulva). Negative for rash.  Neurological:  Positive for dizziness (intermittent). Negative for extremity weakness, gait problem, headaches and numbness.  Hematological:  Negative for adenopathy. Does not bruise/bleed easily.  Psychiatric/Behavioral:  Negative for depression and sleep disturbance. The patient is not nervous/anxious.      VITALS:   There were no vitals taken for this visit.  Wt Readings from Last 3 Encounters:  09/18/23 214 lb 6.4 oz (97.3 kg)  09/11/23 214 lb 1.6 oz (97.1 kg)  09/04/23 213 lb 0.6 oz (96.6 kg)    There is no height or weight on file to calculate BMI.  Performance status (ECOG): 2 - Symptomatic, <50% confined to bed    PHYSICAL EXAM:   Physical Exam Vitals and nursing note reviewed.  Constitutional:      General: She is not in acute distress.    Appearance: Normal appearance.  HENT:     Head: Normocephalic and atraumatic.     Mouth/Throat:     Mouth: Mucous membranes are moist.     Pharynx: Oropharynx is clear. No oropharyngeal exudate or posterior oropharyngeal erythema.  Eyes:     General: No scleral icterus.    Extraocular Movements: Extraocular movements intact.     Conjunctiva/sclera: Conjunctivae normal.     Pupils: Pupils are equal, round, and reactive to light.    Cardiovascular:     Rate and Rhythm: Normal rate and regular rhythm.      Heart sounds: Normal heart sounds. No murmur heard.    No friction rub. No gallop.  Pulmonary:     Effort: Pulmonary effort is normal.     Breath sounds: Normal breath sounds. No wheezing, rhonchi or rales.  Abdominal:     General: There is no distension.     Palpations: Abdomen is soft. There is no hepatomegaly, splenomegaly or mass.     Tenderness: There is no abdominal tenderness.  Genitourinary:    Labia:        Right: No lesion.        Left: No lesion.      Comments: There is no vaginal discharge.    Musculoskeletal:        General: Normal range of motion.     Cervical back: Normal range of motion and neck supple. No tenderness.     Right lower leg: No edema.     Left lower leg: No edema.  Lymphadenopathy:     Cervical: No cervical adenopathy.     Upper Body:     Right upper body: No supraclavicular or axillary adenopathy.     Left upper body: No supraclavicular or axillary adenopathy.     Lower Body: No right inguinal adenopathy. No left inguinal adenopathy.   Skin:    General: Skin is warm and dry.     Coloration: Skin is not jaundiced.     Findings: No rash.   Neurological:     Mental Status: She is alert and oriented to person, place, and time.     Cranial Nerves: No cranial nerve deficit.   Psychiatric:        Mood and Affect: Mood normal.        Behavior: Behavior normal.        Thought Content: Thought content normal.     LABS:      Latest Ref Rng & Units 09/18/2023    8:15 AM 09/11/2023    8:44 AM 09/01/2023    2:31 PM  CBC  WBC 4.0 - 10.5 K/uL 3.3  4.2  5.3   Hemoglobin 12.0 - 15.0 g/dL 89.8  88.2  87.7   Hematocrit 36.0 - 46.0 % 30.7  36.5  37.0   Platelets 150 - 400 K/uL 121  213  211     Latest Reference Range & Units 09/18/23 08:15  Neutrophils % 69  Lymphocytes % 13  Monocytes Relative % 14  Eosinophil % 4  Basophil % 0  Immature Granulocytes % 0  NEUT# 1.7 - 7.7 K/uL 2.2  Lymphs Abs 0.7 - 4.0 K/uL 0.4 (L)  Monocyte # 0.1 - 1.0 K/uL 0.5   Eosinophils Absolute 0.0 - 0.5 K/uL 0.1  Basophils Absolute 0.0 - 0.1 K/uL 0.0  (L): Data is abnormally low    Latest Ref Rng & Units 09/18/2023    8:15 AM 09/11/2023    8:45 AM 09/01/2023    2:31 PM  CMP  Glucose 70 - 99 mg/dL 899  889  884   BUN 8 - 23 mg/dL 17  20  17    Creatinine 0.44 - 1.00 mg/dL 9.00  8.91  9.01  Sodium 135 - 145 mmol/L 138  137  139   Potassium 3.5 - 5.1 mmol/L 4.1  4.6  4.0   Chloride 98 - 111 mmol/L 104  100  105   CO2 22 - 32 mmol/L 24  26  23    Calcium  8.9 - 10.3 mg/dL 9.3  89.8  9.8   Total Protein 6.5 - 8.1 g/dL 6.0  6.6  6.5   Total Bilirubin 0.0 - 1.2 mg/dL 0.2  0.2  <9.7   Alkaline Phos 38 - 126 U/L 67  70  74   AST 15 - 41 U/L 21  22  17    ALT 0 - 44 U/L 21  26  17     Lab Results  Component Value Date   TSH 0.771 08/18/2023   T4TOTAL 8.2 08/18/2023   No results found for: CEA1, CEA / No results found for: CEA1, CEA No results found for: PSA1 No results found for: CAN199 No results found for: CAN125  No results found for: TOTALPROTELP, ALBUMINELP, A1GS, A2GS, BETS, BETA2SER, GAMS, MSPIKE, SPEI No results found for: TIBC, FERRITIN, IRONPCTSAT No results found for: LDH  STUDIES:  EXAM: 07/17/2023 DUAL X-RAY ABSORPTIOMETRY (DXA) FOR BONE MINERAL DENSITY LUMBAR SPINE (L2-L4): BMD (in g/cm2): 0.912 T-score: -2.4 Z-score: -0.7   LEFT FEMORAL NECK: BMD (in g/cm2): 0.793 T-score: -1.8 Z-score: 0.0  LEFT TOTAL HIP: BMD (in g/cm2): 0.922 T-score: -0.7 Z-score: 0.9   RIGHT FEMORAL NECK: BMD (in g/cm2): 0.855 T-score: -1.3 Z-score: 0.5   RIGHT TOTAL HIP: BMD (in g/cm2): 0.896 T-score:-0.9 Z-score: 0.7  EXAM: 06/05/2023 NUCLEAR MEDICINE PET SKULL BASE TO THIGH IMPRESSION: 1. Metastatic left inguinal lymph nodes. No additional evidence of metastatic disease in the neck, chest, abdomen or pelvis. 2. 4 mm right lower lobe nodule, too small for PET resolution. Recommend attention on  follow-up. 3. Small to moderate hiatal hernia. 4.  Aortic atherosclerosis (ICD10-I70.0).    HISTORY:   Past Medical History:  Diagnosis Date   Anemia    Anxiety    Cancer (HCC)    right breast ILC   Complication of anesthesia    difficulty waking up   Depression    Diverticulitis    Family history of breast cancer    Family history of ovarian cancer    Family history of prostate cancer    Fibromyalgia    GERD (gastroesophageal reflux disease)    Heart murmur    History of colonic polyps    History of hiatal hernia    Hyperlipidemia    Hypertension    IBS (irritable bowel syndrome)    Ischemic colitis (HCC) 03/2012   Osteoporosis    Pre-diabetes    RLS (restless legs syndrome)    Sinusitis    Sleep apnea    CPAP nightly    Past Surgical History:  Procedure Laterality Date   BREAST LUMPECTOMY WITH RADIOACTIVE SEED LOCALIZATION Right 08/04/2021   Procedure: RIGHT BREAST LUMPECTOMY WITH RADIOACTIVE SEED LOCALIZATION;  Surgeon: Vanderbilt Ned, MD;  Location: [facility];  Service: General;  Laterality: Right;   CHOLECYSTECTOMY  2004   COLONOSCOPY WITH PROPOFOL  N/A 04/18/2017   Procedure: COLONOSCOPY WITH PROPOFOL ;  Surgeon: Kristie Lamprey, MD;  Location: [facility];  Service: Endoscopy;  Laterality: N/A;   ESOPHAGOGASTRODUODENOSCOPY (EGD) WITH PROPOFOL  N/A 04/18/2017   Procedure: ESOPHAGOGASTRODUODENOSCOPY (EGD) WITH PROPOFOL ;  Surgeon: Kristie Lamprey, MD;  Location: [facility];  Service: Endoscopy;  Laterality: N/A;   EXAM UNDER ANESTHESIA, PELVIC N/A 06/28/2023   Procedure: ERASMO  UNDER ANESTHESIA, PELVIC;  Surgeon: Viktoria Comer SAUNDERS, MD;  Location: [facility];  Service: Gynecology;  Laterality: N/A;   FLEXIBLE SIGMOIDOSCOPY  04/20/2012   Procedure: FLEXIBLE SIGMOIDOSCOPY;  Surgeon: Belvie JONETTA Just, MD;  Location: [facility];  Service: Endoscopy;  Laterality: N/A;   INGUINAL LYMPHADENECTOMY N/A 06/28/2023   Procedure: LYMPHADENECTOMY, INGUINAL, OPEN;  Surgeon: Viktoria Comer SAUNDERS, MD;  Location: [facility];  Service: Gynecology;  Laterality: N/A;   IR IMAGING GUIDED PORT INSERTION  07/31/2023   NISSEN FUNDOPLICATION  2011   PARAESOPHAGEAL HERNIA REPAIR  09/11/2009   and Nissen fundoplication   RADICAL VULVECTOMY N/A 06/28/2023   Procedure: VULVECTOMY, RADICAL;  Surgeon: Viktoria Comer SAUNDERS, MD;  Location: [facility];  Service: Gynecology;  Laterality: N/A;  Possible skin flap   RE-EXCISION OF BREAST LUMPECTOMY Right 08/18/2021   Procedure: RE-EXCISION RIGHT BREAST LUMPECTOMY;  Surgeon: Vanderbilt Ned, MD;  Location: [facility];  Service: General;  Laterality: Right;   SENTINEL NODE BIOPSY N/A 08/04/2021   Procedure: SENTINEL NODE BIOPSY;  Surgeon: Vanderbilt Ned, MD;  Location: [facility];  Service: General;  Laterality: N/A;   TONSILLECTOMY  1960's   TOTAL ABDOMINAL HYSTERECTOMY  2001   w/ BSO    Family History  Problem Relation Age of Onset   Colon cancer Mother 18   Alzheimer's disease Mother    Heart attack Father    Cancer Maternal Aunt        x2   Alzheimer's disease Maternal Aunt    Ovarian cancer Maternal Aunt    Prostate cancer Maternal Uncle    Kidney disease Maternal Uncle    Atrial fibrillation Maternal Uncle    Stroke Paternal Uncle    Aneurysm Paternal Grandmother        brain   Stroke Paternal Grandfather    Prostate cancer Cousin        paternal first cousin   Prostate cancer Cousin        paternal first cousin   Esophageal cancer Cousin        maternal first cousin   Breast cancer Cousin        DCIS, maternal first cousin   Ovarian cancer Cousin        maternal first cousin   Rheum arthritis Other    Lung disease Neg Hx     Social History:  reports that she has never smoked. She has never used smokeless tobacco. She reports that she does not drink alcohol and does not use drugs.The patient is alone today.  Allergies:  Allergies  Allergen Reactions   Ceftin [Cefuroxime Axetil] Diarrhea   Cefuroxime Other (See Comments)     IBS   Other Other (See Comments)    Pneumonia vaccine (uncoded)   Sulfa  Antibiotics Nausea And Vomiting   Tramadol Hcl Nausea And Vomiting   Zofran  [Ondansetron ] Other (See Comments)    Vision changes    Current Medications: Current Outpatient Medications  Medication Sig Dispense Refill   LORazepam  (ATIVAN ) 1 MG tablet Take 1 tablet (1 mg total) by mouth every 8 (eight) hours. 30 tablet 0   acetaminophen  (TYLENOL ) 650 MG CR tablet 1,300 mg in the morning and at bedtime.     ALPRAZolam  (XANAX ) 0.25 MG tablet Take 0.25 mg by mouth at bedtime as needed for anxiety. (Patient not taking: Reported on 09/18/2023)     amLODipine  (NORVASC ) 5 MG tablet Take 5 mg by mouth daily.     Ascorbic Acid (VITAMIN C) 500 MG CHEW  Chew 500 mg by mouth daily.     aspirin EC 81 MG tablet Take 81 mg by mouth at bedtime. Swallow whole.     atenolol  (TENORMIN ) 50 MG tablet Take 75 mg by mouth daily.      benazepril  (LOTENSIN ) 10 MG tablet Take 10 mg by mouth daily.     benzonatate (TESSALON) 100 MG capsule Take 100-200 mg by mouth at bedtime.     Biotin 1000 MCG tablet Take 1,000 mcg by mouth daily.     Calcium  Citrate-Vitamin D (CITRACAL + D PO) Take 1 tablet by mouth daily.     cetirizine (ZYRTEC) 10 MG tablet Take 10 mg by mouth at bedtime.     Cholecalciferol (VITAMIN D) 50 MCG (2000 UT) tablet Take 2,000 Units by mouth daily.     cyanocobalamin 1000 MCG tablet Take 1,000 mcg by mouth daily.     diclofenac sodium (VOLTAREN) 1 % GEL Apply 1 application  topically daily as needed (for pain).     diphenhydrAMINE  (BENADRYL ) 25 MG tablet Take 25 mg by mouth every 6 (six) hours as needed for allergies.     diphenoxylate-atropine (LOMOTIL) 2.5-0.025 MG tablet Take 2 tablets by mouth 4 (four) times daily as needed for diarrhea or loose stools.     DULoxetine  (CYMBALTA ) 60 MG capsule Take 120 mg by mouth at bedtime.     EPINEPHrine  (EPIPEN  2-PAK) 0.3 mg/0.3 mL IJ SOAJ injection Inject 0.3 mg into the muscle as needed  for anaphylaxis. (Patient not taking: Reported on 09/11/2023)     famotidine  (PEPCID ) 20 MG tablet Take 20 mg by mouth at bedtime.     fexofenadine (ALLEGRA) 180 MG tablet Take 180 mg by mouth in the morning.     fluticasone  (FLONASE ) 50 MCG/ACT nasal spray Place 1 spray into both nostrils daily.     lidocaine  (XYLOCAINE ) 5 % ointment Apply 1 Application topically 2 (two) times daily as needed for moderate pain (pain score 4-6) (to the vulva). Apply to vulva as needed for discomfort 35.44 g 3   lidocaine -prilocaine  (EMLA ) cream Apply to affected area once 30 g 3   NON FORMULARY Pt uses a c-pap nightly     Nutritional Supplements (JUICE PLUS FIBRE PO) Take 2 each by mouth daily. Fruits and Vegetables     nystatin  (MYCOSTATIN /NYSTOP ) powder Apply 1 Application topically 3 (three) times daily. 30 g 5   Omega-3 Fatty Acids (FISH OIL) 1000 MG CAPS Take 1,000 mg by mouth daily.      pantoprazole  (PROTONIX ) 40 MG tablet Take 40 mg by mouth every evening.     Phenylephrine -APAP-Guaifenesin (TYLENOL  SINUS SEVERE PO) Take 2 tablets by mouth daily as needed (drainage).     polyvinyl alcohol (LIQUIFILM TEARS) 1.4 % ophthalmic solution Place 1 drop into both eyes 5 (five) times daily.     Probiotic Product (PROBIOTIC & ACIDOPHILUS EX ST PO) Take 1 capsule by mouth daily. Ultra Flora Plus Capsules     prochlorperazine  (COMPAZINE ) 10 MG tablet Take 1 tablet (10 mg total) by mouth every 6 (six) hours as needed for nausea or vomiting. 30 tablet 1   Propylene Glycol (SYSTANE COMPLETE) 0.6 % SOLN Place 1 drop into both eyes in the morning and at bedtime.     rOPINIRole  (REQUIP ) 0.5 MG tablet Take 0.5 mg by mouth at bedtime.     senna (SENOKOT) 8.6 MG tablet 2 tablets at bedtime as needed Orally Once a day     senna-docusate (SENOKOT-S) 8.6-50 MG tablet  Take 2 tablets by mouth at bedtime. For AFTER surgery, do not take if having diarrhea 30 tablet 0   simvastatin  (ZOCOR ) 10 MG tablet Take 10 mg by mouth at bedtime.        sucralfate  (CARAFATE ) 1 GM/10ML suspension Take 1 g by mouth 2 (two) times daily.     traZODone  (DESYREL ) 50 MG tablet Take 50 mg by mouth at bedtime.     triamcinolone  (KENALOG ) 0.025 % ointment Apply 1 Application topically 2 (two) times daily. 30 g 0   vitamin E 400 UNIT capsule Take 400 Units by mouth daily.       White Petrolatum -Mineral Oil (REFRESH P.M. OP) Place 1 Application into both eyes at bedtime.     No current facility-administered medications for this visit.    I,Jasmine M Lassiter,acting as a scribe for Doris VEAR Cornish, MD.,have documented all relevant documentation on the behalf of Doris VEAR Cornish, MD,as directed by  Doris VEAR Cornish, MD while in the presence of Doris VEAR Cornish, MD.

## 2023-09-21 NOTE — Telephone Encounter (Signed)
 Called and spoke with daughter, as she called out for radiation today. The N/V started about 3am ; taking Ativan  for nausea but not helping can't take the other nausea medicine Emesis is bile & food particles. This is the worse it has been Doesn't feel like its a virus. T99. Daughter states she's just really weak   Kelli Mosher,PA:Dr. McC states she either needs to come her for fluids and IV antiemetics or go the ED. We can add reglan, but usually recommend the zofran , but she said it caused her blurry vision. Is she is willing she could try that now? Thanks

## 2023-09-22 ENCOUNTER — Ambulatory Visit

## 2023-09-22 ENCOUNTER — Encounter: Payer: Self-pay | Admitting: Oncology

## 2023-09-22 ENCOUNTER — Inpatient Hospital Stay: Admitting: Oncology

## 2023-09-22 ENCOUNTER — Inpatient Hospital Stay

## 2023-09-22 ENCOUNTER — Other Ambulatory Visit: Payer: Self-pay

## 2023-09-22 ENCOUNTER — Other Ambulatory Visit

## 2023-09-22 ENCOUNTER — Ambulatory Visit: Admitting: Oncology

## 2023-09-22 ENCOUNTER — Other Ambulatory Visit: Payer: Self-pay | Admitting: Pharmacist

## 2023-09-22 DIAGNOSIS — M8580 Other specified disorders of bone density and structure, unspecified site: Secondary | ICD-10-CM | POA: Diagnosis not present

## 2023-09-22 DIAGNOSIS — R509 Fever, unspecified: Secondary | ICD-10-CM | POA: Diagnosis not present

## 2023-09-22 DIAGNOSIS — Z853 Personal history of malignant neoplasm of breast: Secondary | ICD-10-CM | POA: Diagnosis not present

## 2023-09-22 DIAGNOSIS — C519 Malignant neoplasm of vulva, unspecified: Secondary | ICD-10-CM | POA: Diagnosis not present

## 2023-09-22 DIAGNOSIS — F32A Depression, unspecified: Secondary | ICD-10-CM | POA: Diagnosis not present

## 2023-09-22 DIAGNOSIS — T451X5A Adverse effect of antineoplastic and immunosuppressive drugs, initial encounter: Secondary | ICD-10-CM | POA: Diagnosis not present

## 2023-09-22 DIAGNOSIS — A419 Sepsis, unspecified organism: Secondary | ICD-10-CM | POA: Diagnosis not present

## 2023-09-22 DIAGNOSIS — D6959 Other secondary thrombocytopenia: Secondary | ICD-10-CM | POA: Diagnosis not present

## 2023-09-22 DIAGNOSIS — L03311 Cellulitis of abdominal wall: Secondary | ICD-10-CM | POA: Diagnosis not present

## 2023-09-22 DIAGNOSIS — K521 Toxic gastroenteritis and colitis: Secondary | ICD-10-CM | POA: Diagnosis not present

## 2023-09-22 DIAGNOSIS — G4730 Sleep apnea, unspecified: Secondary | ICD-10-CM | POA: Diagnosis not present

## 2023-09-22 DIAGNOSIS — B029 Zoster without complications: Secondary | ICD-10-CM | POA: Diagnosis not present

## 2023-09-22 DIAGNOSIS — C779 Secondary and unspecified malignant neoplasm of lymph node, unspecified: Secondary | ICD-10-CM | POA: Diagnosis not present

## 2023-09-22 DIAGNOSIS — E785 Hyperlipidemia, unspecified: Secondary | ICD-10-CM | POA: Diagnosis not present

## 2023-09-22 DIAGNOSIS — F419 Anxiety disorder, unspecified: Secondary | ICD-10-CM | POA: Diagnosis not present

## 2023-09-22 DIAGNOSIS — R9431 Abnormal electrocardiogram [ECG] [EKG]: Secondary | ICD-10-CM | POA: Diagnosis not present

## 2023-09-22 DIAGNOSIS — G2581 Restless legs syndrome: Secondary | ICD-10-CM | POA: Diagnosis not present

## 2023-09-22 DIAGNOSIS — D649 Anemia, unspecified: Secondary | ICD-10-CM | POA: Diagnosis not present

## 2023-09-22 DIAGNOSIS — E860 Dehydration: Secondary | ICD-10-CM | POA: Diagnosis not present

## 2023-09-22 DIAGNOSIS — D6481 Anemia due to antineoplastic chemotherapy: Secondary | ICD-10-CM | POA: Diagnosis not present

## 2023-09-22 DIAGNOSIS — R112 Nausea with vomiting, unspecified: Secondary | ICD-10-CM | POA: Diagnosis not present

## 2023-09-22 DIAGNOSIS — E876 Hypokalemia: Secondary | ICD-10-CM | POA: Diagnosis not present

## 2023-09-22 DIAGNOSIS — M797 Fibromyalgia: Secondary | ICD-10-CM | POA: Diagnosis not present

## 2023-09-22 DIAGNOSIS — I959 Hypotension, unspecified: Secondary | ICD-10-CM | POA: Diagnosis not present

## 2023-09-22 DIAGNOSIS — I10 Essential (primary) hypertension: Secondary | ICD-10-CM | POA: Diagnosis not present

## 2023-09-22 DIAGNOSIS — N179 Acute kidney failure, unspecified: Secondary | ICD-10-CM | POA: Diagnosis not present

## 2023-09-22 DIAGNOSIS — D61810 Antineoplastic chemotherapy induced pancytopenia: Secondary | ICD-10-CM | POA: Diagnosis not present

## 2023-09-22 DIAGNOSIS — D84821 Immunodeficiency due to drugs: Secondary | ICD-10-CM | POA: Diagnosis not present

## 2023-09-22 DIAGNOSIS — L03315 Cellulitis of perineum: Secondary | ICD-10-CM | POA: Diagnosis not present

## 2023-09-22 NOTE — Telephone Encounter (Signed)
 Pt was admitted yesterday evening @ [facility], Dr Cornelius aware.

## 2023-09-23 ENCOUNTER — Other Ambulatory Visit: Payer: Self-pay

## 2023-09-25 ENCOUNTER — Ambulatory Visit

## 2023-09-25 ENCOUNTER — Ambulatory Visit: Admission: RE | Admit: 2023-09-25 | Source: Ambulatory Visit

## 2023-09-25 ENCOUNTER — Encounter: Payer: Self-pay | Admitting: Oncology

## 2023-09-25 ENCOUNTER — Inpatient Hospital Stay

## 2023-09-26 ENCOUNTER — Ambulatory Visit

## 2023-09-26 ENCOUNTER — Other Ambulatory Visit

## 2023-09-26 ENCOUNTER — Other Ambulatory Visit: Payer: Self-pay

## 2023-09-26 ENCOUNTER — Ambulatory Visit
Admission: RE | Admit: 2023-09-26 | Discharge: 2023-09-26 | Disposition: A | Discharge status: Home / Self Care | Source: Ambulatory Visit | Attending: Radiation Oncology | Admitting: Radiation Oncology

## 2023-09-26 ENCOUNTER — Other Ambulatory Visit: Payer: Self-pay | Admitting: Oncology

## 2023-09-26 ENCOUNTER — Ambulatory Visit: Admitting: Hematology and Oncology

## 2023-09-26 DIAGNOSIS — Z510 Encounter for antineoplastic radiation therapy: Secondary | ICD-10-CM | POA: Insufficient documentation

## 2023-09-26 DIAGNOSIS — D6481 Anemia due to antineoplastic chemotherapy: Secondary | ICD-10-CM

## 2023-09-26 DIAGNOSIS — Z79899 Other long term (current) drug therapy: Secondary | ICD-10-CM | POA: Insufficient documentation

## 2023-09-26 DIAGNOSIS — E876 Hypokalemia: Secondary | ICD-10-CM

## 2023-09-26 DIAGNOSIS — T451X5A Adverse effect of antineoplastic and immunosuppressive drugs, initial encounter: Secondary | ICD-10-CM

## 2023-09-26 DIAGNOSIS — C519 Malignant neoplasm of vulva, unspecified: Secondary | ICD-10-CM

## 2023-09-26 DIAGNOSIS — B029 Zoster without complications: Secondary | ICD-10-CM

## 2023-09-26 DIAGNOSIS — L03311 Cellulitis of abdominal wall: Secondary | ICD-10-CM

## 2023-09-26 DIAGNOSIS — Z853 Personal history of malignant neoplasm of breast: Secondary | ICD-10-CM | POA: Insufficient documentation

## 2023-09-26 DIAGNOSIS — C50111 Malignant neoplasm of central portion of right female breast: Secondary | ICD-10-CM | POA: Insufficient documentation

## 2023-09-26 DIAGNOSIS — C774 Secondary and unspecified malignant neoplasm of inguinal and lower limb lymph nodes: Secondary | ICD-10-CM | POA: Insufficient documentation

## 2023-09-26 DIAGNOSIS — D649 Anemia, unspecified: Secondary | ICD-10-CM

## 2023-09-27 ENCOUNTER — Ambulatory Visit

## 2023-09-27 ENCOUNTER — Other Ambulatory Visit: Payer: Self-pay

## 2023-09-27 ENCOUNTER — Ambulatory Visit: Admission: RE | Admit: 2023-09-27 | Source: Ambulatory Visit

## 2023-09-28 ENCOUNTER — Inpatient Hospital Stay

## 2023-09-28 ENCOUNTER — Other Ambulatory Visit: Payer: Self-pay

## 2023-09-28 ENCOUNTER — Ambulatory Visit

## 2023-09-28 ENCOUNTER — Inpatient Hospital Stay: Admitting: Oncology

## 2023-10-02 ENCOUNTER — Ambulatory Visit

## 2023-10-02 ENCOUNTER — Ambulatory Visit: Admitting: Radiation Oncology

## 2023-10-02 ENCOUNTER — Telehealth: Payer: Self-pay | Admitting: Hematology and Oncology

## 2023-10-02 ENCOUNTER — Inpatient Hospital Stay: Admitting: Hematology and Oncology

## 2023-10-02 ENCOUNTER — Inpatient Hospital Stay

## 2023-10-02 ENCOUNTER — Other Ambulatory Visit: Payer: Self-pay | Admitting: Hematology and Oncology

## 2023-10-02 DIAGNOSIS — C519 Malignant neoplasm of vulva, unspecified: Secondary | ICD-10-CM

## 2023-10-02 NOTE — Telephone Encounter (Signed)
 10/02/23 Spoke with patient and she was just discharged from hospital.Will reschedule to later date.

## 2023-10-03 ENCOUNTER — Ambulatory Visit: Admitting: Radiation Oncology

## 2023-10-03 ENCOUNTER — Ambulatory Visit

## 2023-10-03 ENCOUNTER — Other Ambulatory Visit: Payer: Self-pay

## 2023-10-03 DIAGNOSIS — E860 Dehydration: Secondary | ICD-10-CM | POA: Diagnosis not present

## 2023-10-03 DIAGNOSIS — F411 Generalized anxiety disorder: Secondary | ICD-10-CM | POA: Diagnosis not present

## 2023-10-03 DIAGNOSIS — G4733 Obstructive sleep apnea (adult) (pediatric): Secondary | ICD-10-CM | POA: Diagnosis not present

## 2023-10-03 DIAGNOSIS — F5101 Primary insomnia: Secondary | ICD-10-CM | POA: Diagnosis not present

## 2023-10-03 DIAGNOSIS — K589 Irritable bowel syndrome without diarrhea: Secondary | ICD-10-CM | POA: Diagnosis not present

## 2023-10-03 DIAGNOSIS — R112 Nausea with vomiting, unspecified: Secondary | ICD-10-CM | POA: Diagnosis not present

## 2023-10-03 DIAGNOSIS — G2581 Restless legs syndrome: Secondary | ICD-10-CM | POA: Diagnosis not present

## 2023-10-03 DIAGNOSIS — M797 Fibromyalgia: Secondary | ICD-10-CM | POA: Diagnosis not present

## 2023-10-03 DIAGNOSIS — K219 Gastro-esophageal reflux disease without esophagitis: Secondary | ICD-10-CM | POA: Diagnosis not present

## 2023-10-03 DIAGNOSIS — M810 Age-related osteoporosis without current pathological fracture: Secondary | ICD-10-CM | POA: Diagnosis not present

## 2023-10-03 DIAGNOSIS — N3289 Other specified disorders of bladder: Secondary | ICD-10-CM | POA: Diagnosis not present

## 2023-10-03 DIAGNOSIS — R197 Diarrhea, unspecified: Secondary | ICD-10-CM | POA: Diagnosis not present

## 2023-10-03 DIAGNOSIS — G4761 Periodic limb movement disorder: Secondary | ICD-10-CM | POA: Diagnosis not present

## 2023-10-03 DIAGNOSIS — A419 Sepsis, unspecified organism: Secondary | ICD-10-CM | POA: Diagnosis not present

## 2023-10-03 DIAGNOSIS — D696 Thrombocytopenia, unspecified: Secondary | ICD-10-CM | POA: Diagnosis not present

## 2023-10-03 DIAGNOSIS — B029 Zoster without complications: Secondary | ICD-10-CM | POA: Diagnosis not present

## 2023-10-03 DIAGNOSIS — R7303 Prediabetes: Secondary | ICD-10-CM | POA: Diagnosis not present

## 2023-10-03 DIAGNOSIS — F32A Depression, unspecified: Secondary | ICD-10-CM | POA: Diagnosis not present

## 2023-10-03 DIAGNOSIS — T451X5D Adverse effect of antineoplastic and immunosuppressive drugs, subsequent encounter: Secondary | ICD-10-CM | POA: Diagnosis not present

## 2023-10-03 DIAGNOSIS — L03315 Cellulitis of perineum: Secondary | ICD-10-CM | POA: Diagnosis not present

## 2023-10-03 DIAGNOSIS — D84821 Immunodeficiency due to drugs: Secondary | ICD-10-CM | POA: Diagnosis not present

## 2023-10-03 DIAGNOSIS — I10 Essential (primary) hypertension: Secondary | ICD-10-CM | POA: Diagnosis not present

## 2023-10-03 DIAGNOSIS — K5790 Diverticulosis of intestine, part unspecified, without perforation or abscess without bleeding: Secondary | ICD-10-CM | POA: Diagnosis not present

## 2023-10-03 DIAGNOSIS — C519 Malignant neoplasm of vulva, unspecified: Secondary | ICD-10-CM | POA: Diagnosis not present

## 2023-10-03 DIAGNOSIS — D630 Anemia in neoplastic disease: Secondary | ICD-10-CM | POA: Diagnosis not present

## 2023-10-03 NOTE — Progress Notes (Signed)
 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  She was treated with pelvic vulvectomy, bilateral debulking, radical lymphadenectomy, and inguinal dissection in April 2025.  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  There was lymphovascular invasion and carcinoma in situ at the 6 o'clock margin with < 1 mm margin for invasive carcinoma. PET scan revealed metastatic left inguinal lymph nodes up to 1.8 cm in diameter, but no additional evidence of metastatic disease in the neck, chest, abdomen or pelvis. There was a 4 mm right lower lobe nodule, too small for PET resolution.   She was receiving concurrent chemoradiation with weekly cisplatin , as well as pembrolizuab every 3 weeks since her PDL 1 score is 85%, followed by maintenance pembrolizumab .  She had difficulty tolerating treatment due to nausea, vomiting and diarrhea, as well as hypomagnesemia.  We gave her additional IV fluids weekly and IV magnesium  as needed.  She developed mild pancytopenia felt to be most likely due to treatment.  After her third cycle of weekly cisplatin , she was hospitalized with sepsis due to recurrent cellulitis of the left vulva and thigh.  She then developed shingles of the left thigh.  She had pancytopenia at least in part due to chemotherapy.  At discharge on July 4, WBCs were 2.5, hemoglobin 8.7 and platelets 142,000.  She had hypokalemia and hypomagnesemia resolved with IV Supple Tatian.  She was discharged on doxycycline and valacyclovir.  Due to the multiple issues due to chemotherapy, we recommended stopping cisplatin   and transitioning to maintenance pembrolizumab  once radiation is complete.  She is feeling better at this time, however she has recurrent hypokalemia and hypomagnesemia and is clinically dehydrated.  She will receive IV fluids, potassium and magnesium  today.  She does not wish to start oral potassium supplement, so we will hold off on that for now.  I gave her a list of potassium containing foods.  She wishes to delay starting radiation therapy until July 21.  We will plan to see her weekly with a CBC, comprehensive metabolic panel and magnesium  for continued supportive care.   Hypomagnesemia Mild recurrent hypomagnesemia, possibly due to previous cisplatin  chemotherapy versus GI losses versus decreased intake. She will receive IV magnesium  today. We will continue to check magnesium  weekly for now.  Hypokalemia Mild recurrent hypokalemia, possibly due to previous chemotherapy versus GI losses versus decreased intake. She will receive IV potassium today.  She does not wish to take oral potassium supplement.  I gave her a list of potassium containing foods.  We will continue to check potassium weekly for now.   Cellulitis Persistent cellulitis of the lower abdomen despite doxycycline. We recommend starting an alternative antibiotic. She did not tolerate cephalosporins due to diarrhea nor sulfa  drugs due to nausea.  She is amenable to trying amoxicillin .  I will place her on amoxicillin  875 mg twice daily for 10 days.  This may need to be extended if he does not have complete resolution when we see her next week.  Shingles Herpes zoster of the left inner thigh is dry and nearly resolved.  She will complete valacyclovir  as prescribed.  Anemia due to antineoplastic chemotherapy Anemia most likely due to chemotherapy.  She has had previous iron deficiency.  So I will repeat iron studies today.  I will also obtain B12 and folate.     The patient understands the plans discussed today and is in  agreement with them.  She knows to contact our office if she develops concerns prior to her next appointment.   I provided 45 minutes of face-to-face time during this encounter and > 50% was spent counseling as documented under my assessment and plan.    [name], PA-C  [facility] [place] - A DEPT OF [facility] [address] [place] [address] Dept: [phone] Dept Fax: [phone]   Orders Placed This Encounter  Procedures   Ferritin    Standing Status:   Future    Expected Date:   10/04/2023    Expiration Date:   01/02/2024   Folate    Standing Status:   Future    Expected Date:   10/04/2023    Expiration Date:   01/02/2024   Iron and TIBC    Standing Status:   Future    Expected Date:   10/04/2023    Expiration Date:   01/02/2024   Vitamin B12    Standing Status:   Future    Expected Date:   10/04/2023    Expiration Date:   01/02/2024      CHIEF COMPLAINT:  CC: Stage IIIC vulvar cancer  Current Treatment: Concurrent chemoradiation with weekly cisplatin  and pembrolizumab  every 3 weeks  HISTORY OF PRESENT ILLNESS:  Doris Reyes 73 y.o. 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 PET scan revealed metastatic left inguinal lymph nodes up to 1.8 cm in diameter, but no additional evidence of metastatic disease in the neck, chest, abdomen or pelvis. There was a 4 mm right lower lobe nodule, too small for PET resolution.  She was treated vulvectomy, bilateral debulking, radical lymphadenectomy, and inguinal dissection in April 2025.  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  There was lymphovascular invasion and carcinoma in situ at the 6 o'clock margin with < 1 mm  margin for invasive carcinoma.   She has a history of stage IIA hormone receptor positive right breast cancer diagnosed in March 2023.  Screening mamm[facility] at [facility] in February 2023 revealed architectural distortion felt to be indeterminant.  Diagnostic mammogram March revealed mild nipple retraction and a 1 cm area of focal asymmetry in the retroareolar region.  Ultrasound revealed a hypoechoic irregular mass measuring 9 mm in that area.  Biopsy revealed grade 2, invasive mammary carcinoma, as well as mammary carcinoma in situ.  E-cadherin immunohistochemistry stains were negative, so this was consistent with invasive lobular carcinoma.  Estrogen receptors were positive at 95% and progesterone receptors positive at 95%.  HER2 by immunohistochemistry was positive at 2+ but negative by FISH.  Ki-67 was less than 5%.  The carcinoma in situ was positive for E-cadherin, consistent with ductal carcinoma in situ.  MRI of the breast and was found to have focal enhancement in the right breast surrounding the biopsy clip and some non-mass enhancement posterior to the malignancy.  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 Endopredict testing revealed an EpClin score of 2.6, which is low risk. This correlates with a 5.1% risk of distant recurrence in the next 10 years, with only a 1.0% benefit of chemotherapy. Her risk of late recurrence is 4.0%. I recommended hormonal therapy, but she refused.   Due to her personal and family history of malignancy, she underwent testing for hereditary cancer syndromes with the [employer].  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  This may be associated with increased risk for colon cancer but the main concern is that she is a  carrier with heterozygous mutation.  It was recommended that she have colonoscopy beginning at age 35 or 10 years prior to the age of her first-degree use relatives age at colorectal cancer diagnosis.  It was recommended this be repeated every 5 years.  This was explained to her.  Diagnostic bilateral mammogram done on 05/04/2023 was clear. Bone density scan done on 07/17/2023 revealed osteopenia.     Oncology History  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)  06/08/2021 Initial Diagnosis   Breast cancer in female [facility])   07/23/2021 Genetic Testing   Negative hereditary cancer genetic testing: no pathogenic variants detected in [facility] BRCAPlus Panel. Report date is July 23, 2021.  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  The report date is Jul 26, 2021.  The BRCAplus panel offered by [employer] and includes sequencing and deletion/duplication analysis for the following 8 genes: ATM, BRCA1, BRCA2, CDH1, CHEK2, PALB2, PTEN, and TP53.  Results of pan-cancer panel pending.   The CancerNext-Expanded gene panel offered by [facility] and includes sequencing and rearrangement analysis for the following 77 genes: AIP, ALK, APC*, ATM*, AXIN2, BAP1, BARD1, BLM, BMPR1A, BRCA1*, BRCA2*, BRIP1*, CDC73, CDH1*, CDK4, CDKN1B, CDKN2A, CHEK2*, CTNNA1, DICER1, FANCC, FH, FLCN, GALNT12, KIF1B, LZTR1, MAX, MEN1, MET, MLH1*, MSH2*, MSH3, MSH6*, MUTYH*, NBN, NF1*, NF2, NTHL1, PALB2*, PHOX2B, PMS2*, POT1, PRKAR1A, PTCH1, PTEN*, RAD51C*, RAD51D*, RB1, RECQL, RET, SDHA, SDHAF2, SDHB, SDHC, SDHD, SMAD4, SMARCA4, SMARCB1, SMARCE1, STK11, SUFU, TMEM127, TP53*, TSC1, TSC2, VHL and XRCC2 (sequencing and deletion/duplication); EGFR, EGLN1, HOXB13, KIT, MITF, PDGFRA, POLD1, and POLE (sequencing only); EPCAM and GREM1 (deletion/duplication only). DNA and RNA analyses performed for *  genes.    08/23/2021 Cancer Staging   Staging form: Breast, AJCC 8th Edition - Pathologic stage from 08/23/2021: Stage IA (pT2(2), pN0(sn), cM0, G1, ER+, PR+, HER2-) - Signed by Cornelius Wanda DEL, MD on 09/29/2021 Histopathologic type: Lobular carcinoma, NOS Stage prefix: Initial diagnosis Method of lymph node assessment: Sentinel lymph node biopsy Nuclear grade: G1 Multigene prognostic tests performed: EndoPredict Histologic grading system: 3 grade system Residual tumor (R): R0 - None Laterality: Right Tumor size (mm): 24 Multiple tumors: Yes Number of tumors: 2 Lymph-vascular invasion (LVI): LVI not present (absent)/not identified Diagnostic confirmation: Positive histology PLUS positive immunophenotyping and/or positive genetic studies Specimen type: Excision Staged by: Managing physician Menopausal status: Postmenopausal Ki-67 (%): 5 Stage used in treatment planning: Yes National guidelines used in treatment planning: Yes Type of national guideline used in treatment planning: NCCN   Vulvar cancer (HCC)  06/28/2023 Initial Diagnosis   Vulvar cancer (HCC)   06/28/2023 Cancer Staging   Staging form: Vulva,  AJCC V9 - Clinical stage from 06/28/2023: FIGO Stage IIIC (cT1b, cN1c, cM0) - Signed by Cornelius Wanda DEL, MD on 07/31/2023 Histopathologic type: Squamous cell carcinoma, NOS Stage prefix: Initial diagnosis Method of lymph node assessment: Lymph node dissection Histologic grade (G): G2 Histologic grading system: 3 grade system Tumor size (mm): 30 Lymph-vascular invasion (LVI): LVI present/identified, NOS Diagnostic confirmation: Positive histology Specimen type: Excision Staged by: Managing physician Femoral-inguinal nodal status: Positive Solitary (s) or multifocal (m) tumors in the primary site: Solitary Perineural invasion (PNI): Unknown Stage used in treatment planning: Yes National guidelines used in treatment planning: Yes Type of national guideline used in  treatment planning: NCCN   09/04/2023 - 09/18/2023 Chemotherapy   Patient is on Treatment Plan : CERVICAL Pembrolizumab  q21d + XRT (cisplatin  d/c'd 09/25/23)     10/23/2023 -  Chemotherapy   Patient is on Treatment Plan : CERVICAL Pembrolizumab  (200) q21d         INTERVAL HISTORY:   Braylynn is here today for repeat clinical assessment prior to a second cycle of pembrolizumab .  She had severe difficulty tolerating chemotherapy due to nausea and vomiting.  She did not tolerate ondansetron  or prochlorperazine .  Ativan  and Reglan were ineffective.  She was hospitalized on June 26 after a third cycle of weekly cisplatin  with sepsis due to recurrent cellulitis of the left vulva and thigh.  She then developed shingles of the left thigh.  She had anemia which worsened with IV hydration.  She had pancytopenia likely due to chemotherapy, with WBC of 3.7, hemoglobin 10.8 and platelets 128,000.  Her anemia worsened with hydration.  Hemoglobin was 8.7 on discharge on July 4.  WBCs were lower on discharge at 2.5, but platelets had normalized.  She developed bilateral lower extremity edema, so fluids were discontinued and she was given furosemide.  Her kidney function worsened on July 3, so she was kept an additional day for further IV fluids.  Her creatinine improved from 1.66 to 1.3.  She also had hypokalemia and hypomagnesemia corrected with IV supplementation.  She was discharged on doxycycline and valacyclovir.  Due to the multiple issues with chemotherapy, we recommended stopping cisplatin .  We plan to give maintenance pembrolizumab  once radiation is complete.   She denies fevers or chills.  She states her shingles is drying up.  Her lower abdominal skin remains red and slightly warm.  She reports an episode of vomiting this morning, but denies continued nausea, vomiting or diarrhea.  She denies pain. Her appetite is good. Her weight has decreased 3 pounds over last 2 weeks.  She states she will finish her  doxycycline today.  She states she does not wish to take oral potassium supplement.  She states she does not feel ready to start radiation at this time.  She is seeing Dr. Jomarie after our visit today.   REVIEW OF SYSTEMS:   Review of Systems  Constitutional:  Negative for appetite change, chills, diaphoresis, fatigue, fever and unexpected weight change.  HENT:   Negative for lump/mass, mouth sores, nosebleeds, sore throat and trouble swallowing.   Respiratory:  Positive for shortness of breath (with exertion). Negative for cough and hemoptysis.   Cardiovascular:  Positive for leg swelling. Negative for chest pain and palpitations.  Gastrointestinal:  Negative for abdominal pain, blood in stool, constipation, diarrhea, nausea and vomiting.  Genitourinary:  Negative for difficulty urinating, dysuria, frequency, hematuria and vaginal bleeding.   Musculoskeletal:  Positive for gait problem (chronic balance issues). Negative for arthralgias, back pain,  myalgias and neck pain.  Skin:  Positive for rash (shingles).  Neurological:  Positive for gait problem (chronic balance issues), headaches (intermittent since chemo, severe today), light-headedness and numbness (neuropathy of the feet). Negative for dizziness and extremity weakness.  Hematological:  Negative for adenopathy. Does not bruise/bleed easily.  Psychiatric/Behavioral:  Negative for depression and sleep disturbance. The patient is not nervous/anxious.      VITALS:   Blood pressure 134/76, pulse (!) 57, temperature 98.6 F (37 C), resp. rate 18, height 5' 3 (1.6 m), weight 211 lb (95.7 kg), SpO2 97%.  Wt Readings from Last 3 Encounters:  10/04/23 211 lb (95.7 kg)  09/18/23 214 lb 6.4 oz (97.3 kg)  09/11/23 214 lb 1.6 oz (97.1 kg)    Body mass index is 37.38 kg/m.  Performance status (ECOG): 2 - Symptomatic, <50% confined to bed    PHYSICAL EXAM:   Physical Exam Vitals and nursing note reviewed.  Constitutional:       General: She is not in acute distress.    Appearance: Normal appearance.  HENT:     Head: Normocephalic and atraumatic.     Mouth/Throat:     Mouth: Mucous membranes are moist.     Pharynx: Oropharynx is clear. No oropharyngeal exudate or posterior oropharyngeal erythema.  Eyes:     General: No scleral icterus.    Extraocular Movements: Extraocular movements intact.     Conjunctiva/sclera: Conjunctivae normal.     Pupils: Pupils are equal, round, and reactive to light.  Cardiovascular:     Rate and Rhythm: Normal rate and regular rhythm. Frequent Extrasystoles are present.    Heart sounds: Normal heart sounds. No murmur heard.    No friction rub. No gallop.  Pulmonary:     Effort: Pulmonary effort is normal.     Breath sounds: Normal breath sounds. No wheezing, rhonchi or rales.  Abdominal:     General: There is no distension.     Palpations: Abdomen is soft. There is no hepatomegaly, splenomegaly or mass.     Tenderness: There is no abdominal tenderness.  Musculoskeletal:        General: Normal range of motion.     Cervical back: Normal range of motion and neck supple. No tenderness.     Right lower leg: No edema.     Left lower leg: No edema.  Lymphadenopathy:     Cervical: No cervical adenopathy.     Upper Body:     Right upper body: No supraclavicular or axillary adenopathy.     Left upper body: No supraclavicular or axillary adenopathy.     Lower Body: No right inguinal adenopathy. No left inguinal adenopathy.  Skin:    General: Skin is warm and dry.     Coloration: Skin is not jaundiced.     Findings: Erythema and rash present.     Comments: Persistent erythema with mild overlying warmth of the lower abdomen.  Herpes zoster is drying up and nearly resolved.  There is a rash of the bilateral lower extremities, most consistent with stasis changes  Neurological:     Mental Status: She is alert and oriented to person, place, and time.     Cranial Nerves: No cranial nerve  deficit.  Psychiatric:        Mood and Affect: Mood normal.        Behavior: Behavior normal.        Thought Content: Thought content normal.     LABS:  Latest Ref Rng & Units 10/04/2023    8:50 AM 09/18/2023    8:15 AM 09/11/2023    8:44 AM  CBC  WBC 4.0 - 10.5 K/uL 2.7  3.3  4.2   Hemoglobin 12.0 - 15.0 g/dL 8.5  89.8  88.2   Hematocrit 36.0 - 46.0 % 25.9  30.7  36.5   Platelets 150 - 400 K/uL 169  121  213       Latest Ref Rng & Units 10/04/2023    8:50 AM 09/18/2023    8:15 AM 09/11/2023    8:45 AM  CMP  Glucose 70 - 99 mg/dL 892  899  889   BUN 8 - 23 mg/dL 25  17  20    Creatinine 0.44 - 1.00 mg/dL 8.77  9.00  8.91   Sodium 135 - 145 mmol/L 139  138  137   Potassium 3.5 - 5.1 mmol/L 3.1  4.1  4.6   Chloride 98 - 111 mmol/L 103  104  100   CO2 22 - 32 mmol/L 24  24  26    Calcium  8.9 - 10.3 mg/dL 9.3  9.3  89.8   Total Protein 6.5 - 8.1 g/dL 6.2  6.0  6.6   Total Bilirubin 0.0 - 1.2 mg/dL 0.3  0.2  0.2   Alkaline Phos 38 - 126 U/L 80  67  70   AST 15 - 41 U/L 24  21  22    ALT 0 - 44 U/L 26  21  26       No results found for: CEA1, CEA / No results found for: CEA1, CEA No results found for: PSA1 No results found for: CAN199 No results found for: CAN125  No results found for: TOTALPROTELP, ALBUMINELP, A1GS, A2GS, BETS, BETA2SER, GAMS, MSPIKE, SPEI No results found for: TIBC, FERRITIN, IRONPCTSAT No results found for: LDH  STUDIES:   No results found.    HISTORY:   Past Medical History:  Diagnosis Date   Anemia    Anxiety    Cancer (HCC)    right breast ILC   Complication of anesthesia    difficulty waking up   Depression    Diverticulitis    Family history of breast cancer    Family history of ovarian cancer    Family history of prostate cancer    Fibromyalgia    GERD (gastroesophageal reflux disease)    Heart murmur    History of colonic polyps    History of hiatal hernia    Hyperlipidemia     Hypertension    IBS (irritable bowel syndrome)    Ischemic colitis (HCC) 03/2012   Osteoporosis    Pre-diabetes    RLS (restless legs syndrome)    Sinusitis    Sleep apnea    CPAP nightly    Past Surgical History:  Procedure Laterality Date   BREAST LUMPECTOMY WITH RADIOACTIVE SEED LOCALIZATION Right 08/04/2021   Procedure: RIGHT BREAST LUMPECTOMY WITH RADIOACTIVE SEED LOCALIZATION;  Surgeon: Vanderbilt Ned, MD;  Location: [facility];  Service: General;  Laterality: Right;   CHOLECYSTECTOMY  2004   COLONOSCOPY WITH PROPOFOL  N/A 04/18/2017   Procedure: COLONOSCOPY WITH PROPOFOL ;  Surgeon: Kristie Lamprey, MD;  Location: [facility];  Service: Endoscopy;  Laterality: N/A;   ESOPHAGOGASTRODUODENOSCOPY (EGD) WITH PROPOFOL  N/A 04/18/2017   Procedure: ESOPHAGOGASTRODUODENOSCOPY (EGD) WITH PROPOFOL ;  Surgeon: Kristie Lamprey, MD;  Location: [facility];  Service: Endoscopy;  Laterality: N/A;   EXAM UNDER ANESTHESIA, PELVIC N/A 06/28/2023   Procedure: ERASMO  UNDER ANESTHESIA, PELVIC;  Surgeon: Viktoria Comer SAUNDERS, MD;  Location: [facility];  Service: Gynecology;  Laterality: N/A;   FLEXIBLE SIGMOIDOSCOPY  04/20/2012   Procedure: FLEXIBLE SIGMOIDOSCOPY;  Surgeon: Belvie JONETTA Just, MD;  Location: [facility];  Service: Endoscopy;  Laterality: N/A;   INGUINAL LYMPHADENECTOMY N/A 06/28/2023   Procedure: LYMPHADENECTOMY, INGUINAL, OPEN;  Surgeon: Viktoria Comer SAUNDERS, MD;  Location: [facility];  Service: Gynecology;  Laterality: N/A;   IR IMAGING GUIDED PORT INSERTION  07/31/2023   NISSEN FUNDOPLICATION  2011   PARAESOPHAGEAL HERNIA REPAIR  09/11/2009   and Nissen fundoplication   RADICAL VULVECTOMY N/A 06/28/2023   Procedure: VULVECTOMY, RADICAL;  Surgeon: Viktoria Comer SAUNDERS, MD;  Location: [facility];  Service: Gynecology;  Laterality: N/A;  Possible skin flap   RE-EXCISION OF BREAST LUMPECTOMY Right 08/18/2021   Procedure: RE-EXCISION RIGHT BREAST LUMPECTOMY;  Surgeon: Vanderbilt Ned, MD;  Location: [facility];  Service: General;  Laterality: Right;   SENTINEL NODE BIOPSY N/A 08/04/2021   Procedure: SENTINEL NODE BIOPSY;  Surgeon: Vanderbilt Ned, MD;  Location: [facility];  Service: General;  Laterality: N/A;   TONSILLECTOMY  1960's   TOTAL ABDOMINAL HYSTERECTOMY  2001   w/ BSO    Family History  Problem Relation Age of Onset   Colon cancer Mother 70   Alzheimer's disease Mother    Heart attack Father    Cancer Maternal Aunt        x2   Alzheimer's disease Maternal Aunt    Ovarian cancer Maternal Aunt    Prostate cancer Maternal Uncle    Kidney disease Maternal Uncle    Atrial fibrillation Maternal Uncle    Stroke Paternal Uncle    Aneurysm Paternal Grandmother        brain   Stroke Paternal Grandfather    Prostate cancer Cousin        paternal first cousin   Prostate cancer Cousin        paternal first cousin   Esophageal cancer Cousin        maternal first cousin   Breast cancer Cousin        DCIS, maternal first cousin   Ovarian cancer Cousin        maternal first cousin   Rheum arthritis Other    Lung disease Neg Hx     Social History:  reports that she has never smoked. She has never used smokeless tobacco. She reports that she does not drink alcohol and does not use drugs.The patient is accompanied by her daughter, Jeoffrey, today.  Allergies:  Allergies  Allergen Reactions   Ceftin [Cefuroxime Axetil] Diarrhea   Cefuroxime Other (See Comments)    IBS   Compazine  [Prochlorperazine ] Other (See Comments)    Vision problems   Other Other (See Comments)    Pneumonia vaccine (uncoded)   Sulfa  Antibiotics Nausea And Vomiting   Tramadol Hcl Nausea And Vomiting   Zofran  [Ondansetron ] Other (See Comments)    Vision changes    Current Medications: Current Outpatient Medications  Medication Sig Dispense Refill   amoxicillin  (AMOXIL ) 875 MG tablet Take 1 tablet (875 mg total) by mouth 2 (two) times daily. 20 tablet 0   dicyclomine (BENTYL) 10 MG capsule Take 1 capsule  by mouth as needed.     doxycycline (MONODOX) 100 MG capsule Take 100 mg by mouth 2 (two) times daily.     lidocaine  4 % Place 1 patch onto the skin daily.     LORazepam  (ATIVAN )  1 MG tablet Take 1 tablet (1 mg total) by mouth every 8 (eight) hours. 30 tablet 0   valACYclovir (VALTREX) 1000 MG tablet Take 1,000 mg by mouth 3 (three) times daily.     acetaminophen  (TYLENOL ) 650 MG CR tablet 1,300 mg in the morning and at bedtime.     ALPRAZolam  (XANAX ) 0.25 MG tablet Take 0.25 mg by mouth at bedtime as needed for anxiety. (Patient not taking: Reported on 09/18/2023)     amLODipine  (NORVASC ) 5 MG tablet Take 5 mg by mouth daily.     Ascorbic Acid (VITAMIN C) 500 MG CHEW Chew 500 mg by mouth daily.     aspirin EC 81 MG tablet Take 81 mg by mouth at bedtime. Swallow whole.     atenolol  (TENORMIN ) 50 MG tablet Take 75 mg by mouth daily.      benazepril  (LOTENSIN ) 10 MG tablet Take 10 mg by mouth daily.     benzonatate (TESSALON) 100 MG capsule Take 100-200 mg by mouth at bedtime.     Biotin 1000 MCG tablet Take 1,000 mcg by mouth daily.     Calcium  Citrate-Vitamin D (CITRACAL + D PO) Take 1 tablet by mouth daily.     cetirizine (ZYRTEC) 10 MG tablet Take 10 mg by mouth at bedtime.     Cholecalciferol (VITAMIN D) 50 MCG (2000 UT) tablet Take 2,000 Units by mouth daily.     cyanocobalamin  1000 MCG tablet Take 1,000 mcg by mouth daily.     diclofenac sodium (VOLTAREN) 1 % GEL Apply 1 application  topically daily as needed (for pain).     diphenhydrAMINE  (BENADRYL ) 25 MG tablet Take 25 mg by mouth every 6 (six) hours as needed for allergies.     diphenoxylate-atropine (LOMOTIL) 2.5-0.025 MG tablet Take 2 tablets by mouth 4 (four) times daily as needed for diarrhea or loose stools.     DULoxetine  (CYMBALTA ) 60 MG capsule Take 120 mg by mouth at bedtime.     EPINEPHrine  (EPIPEN  2-PAK) 0.3 mg/0.3 mL IJ SOAJ injection Inject 0.3 mg into the muscle as needed for anaphylaxis. (Patient not taking: Reported on  09/11/2023)     famotidine  (PEPCID ) 20 MG tablet Take 20 mg by mouth at bedtime.     fexofenadine (ALLEGRA) 180 MG tablet Take 180 mg by mouth in the morning.     fluticasone  (FLONASE ) 50 MCG/ACT nasal spray Place 1 spray into both nostrils daily.     lidocaine  (XYLOCAINE ) 5 % ointment Apply 1 Application topically 2 (two) times daily as needed for moderate pain (pain score 4-6) (to the vulva). Apply to vulva as needed for discomfort 35.44 g 3   NON FORMULARY Pt uses a c-pap nightly     Nutritional Supplements (JUICE PLUS FIBRE PO) Take 2 each by mouth daily. Fruits and Vegetables     nystatin  (MYCOSTATIN /NYSTOP ) powder Apply 1 Application topically 3 (three) times daily. 30 g 5   Omega-3 Fatty Acids (FISH OIL) 1000 MG CAPS Take 1,000 mg by mouth daily.      pantoprazole  (PROTONIX ) 40 MG tablet Take 40 mg by mouth every evening.     Phenylephrine -APAP-Guaifenesin (TYLENOL  SINUS SEVERE PO) Take 2 tablets by mouth daily as needed (drainage).     polyvinyl alcohol (LIQUIFILM TEARS) 1.4 % ophthalmic solution Place 1 drop into both eyes 5 (five) times daily.     Probiotic Product (PROBIOTIC & ACIDOPHILUS EX ST PO) Take 1 capsule by mouth daily. Ultra Flora Plus Capsules  Propylene Glycol (SYSTANE COMPLETE) 0.6 % SOLN Place 1 drop into both eyes in the morning and at bedtime.     rOPINIRole  (REQUIP ) 0.5 MG tablet Take 0.5 mg by mouth at bedtime.     senna (SENOKOT) 8.6 MG tablet 2 tablets at bedtime as needed Orally Once a day (Patient not taking: Reported on 10/04/2023)     senna-docusate (SENOKOT-S) 8.6-50 MG tablet Take 2 tablets by mouth at bedtime. For AFTER surgery, do not take if having diarrhea 30 tablet 0   simvastatin  (ZOCOR ) 10 MG tablet Take 10 mg by mouth at bedtime.       sucralfate  (CARAFATE ) 1 GM/10ML suspension Take 1 g by mouth 2 (two) times daily.     traZODone  (DESYREL ) 50 MG tablet Take 50 mg by mouth at bedtime.     triamcinolone  (KENALOG ) 0.025 % ointment Apply 1 Application  topically 2 (two) times daily. 30 g 0   vitamin E 400 UNIT capsule Take 400 Units by mouth daily.       White Petrolatum -Mineral Oil (REFRESH P.M. OP) Place 1 Application into both eyes at bedtime.     No current facility-administered medications for this visit.

## 2023-10-04 ENCOUNTER — Inpatient Hospital Stay: Attending: Oncology | Admitting: Hematology and Oncology

## 2023-10-04 ENCOUNTER — Inpatient Hospital Stay

## 2023-10-04 ENCOUNTER — Ambulatory Visit

## 2023-10-04 ENCOUNTER — Other Ambulatory Visit (HOSPITAL_BASED_OUTPATIENT_CLINIC_OR_DEPARTMENT_OTHER): Payer: Self-pay

## 2023-10-04 ENCOUNTER — Ambulatory Visit
Admission: RE | Admit: 2023-10-04 | Discharge: 2023-10-04 | Disposition: A | Discharge status: Home / Self Care | Source: Ambulatory Visit | Attending: Radiation Oncology | Admitting: Radiation Oncology

## 2023-10-04 ENCOUNTER — Encounter: Payer: Self-pay | Admitting: Hematology and Oncology

## 2023-10-04 ENCOUNTER — Other Ambulatory Visit: Payer: Self-pay | Admitting: Pharmacist

## 2023-10-04 VITALS — BP 134/76 | HR 57 | Temp 98.6°F | Resp 18 | Ht 63.0 in | Wt 211.0 lb

## 2023-10-04 DIAGNOSIS — E876 Hypokalemia: Secondary | ICD-10-CM | POA: Diagnosis not present

## 2023-10-04 DIAGNOSIS — B029 Zoster without complications: Secondary | ICD-10-CM | POA: Diagnosis not present

## 2023-10-04 DIAGNOSIS — Z79899 Other long term (current) drug therapy: Secondary | ICD-10-CM | POA: Diagnosis not present

## 2023-10-04 DIAGNOSIS — C519 Malignant neoplasm of vulva, unspecified: Secondary | ICD-10-CM | POA: Insufficient documentation

## 2023-10-04 DIAGNOSIS — E785 Hyperlipidemia, unspecified: Secondary | ICD-10-CM | POA: Insufficient documentation

## 2023-10-04 DIAGNOSIS — C50812 Malignant neoplasm of overlapping sites of left female breast: Secondary | ICD-10-CM | POA: Insufficient documentation

## 2023-10-04 DIAGNOSIS — T451X5A Adverse effect of antineoplastic and immunosuppressive drugs, initial encounter: Secondary | ICD-10-CM | POA: Diagnosis not present

## 2023-10-04 DIAGNOSIS — Z803 Family history of malignant neoplasm of breast: Secondary | ICD-10-CM | POA: Insufficient documentation

## 2023-10-04 DIAGNOSIS — D6481 Anemia due to antineoplastic chemotherapy: Secondary | ICD-10-CM

## 2023-10-04 DIAGNOSIS — D649 Anemia, unspecified: Secondary | ICD-10-CM

## 2023-10-04 DIAGNOSIS — Z7982 Long term (current) use of aspirin: Secondary | ICD-10-CM | POA: Diagnosis not present

## 2023-10-04 DIAGNOSIS — Z170 Estrogen receptor positive status [ER+]: Secondary | ICD-10-CM | POA: Insufficient documentation

## 2023-10-04 DIAGNOSIS — E860 Dehydration: Secondary | ICD-10-CM

## 2023-10-04 DIAGNOSIS — Z800 Family history of malignant neoplasm of digestive organs: Secondary | ICD-10-CM | POA: Insufficient documentation

## 2023-10-04 DIAGNOSIS — I10 Essential (primary) hypertension: Secondary | ICD-10-CM | POA: Insufficient documentation

## 2023-10-04 DIAGNOSIS — L03311 Cellulitis of abdominal wall: Secondary | ICD-10-CM

## 2023-10-04 DIAGNOSIS — L0390 Cellulitis, unspecified: Secondary | ICD-10-CM | POA: Insufficient documentation

## 2023-10-04 LAB — CBC WITH DIFFERENTIAL (CANCER CENTER ONLY)
Abs Immature Granulocytes: 0.02 K/uL (ref 0.00–0.07)
Basophils Absolute: 0 K/uL (ref 0.0–0.1)
Basophils Relative: 1 %
Eosinophils Absolute: 0 K/uL (ref 0.0–0.5)
Eosinophils Relative: 1 %
HCT: 25.9 % — ABNORMAL LOW (ref 36.0–46.0)
Hemoglobin: 8.5 g/dL — ABNORMAL LOW (ref 12.0–15.0)
Immature Granulocytes: 1 %
Lymphocytes Relative: 15 %
Lymphs Abs: 0.4 K/uL — ABNORMAL LOW (ref 0.7–4.0)
MCH: 28.1 pg (ref 26.0–34.0)
MCHC: 32.8 g/dL (ref 30.0–36.0)
MCV: 85.5 fL (ref 80.0–100.0)
Monocytes Absolute: 0.5 K/uL (ref 0.1–1.0)
Monocytes Relative: 19 %
Neutro Abs: 1.7 K/uL (ref 1.7–7.7)
Neutrophils Relative %: 63 %
Platelet Count: 169 K/uL (ref 150–400)
RBC: 3.03 MIL/uL — ABNORMAL LOW (ref 3.87–5.11)
RDW: 15.1 % (ref 11.5–15.5)
WBC Count: 2.7 K/uL — ABNORMAL LOW (ref 4.0–10.5)
nRBC: 0 % (ref 0.0–0.2)

## 2023-10-04 LAB — CMP (CANCER CENTER ONLY)
ALT: 26 U/L (ref 0–44)
AST: 24 U/L (ref 15–41)
Albumin: 3.8 g/dL (ref 3.5–5.0)
Alkaline Phosphatase: 80 U/L (ref 38–126)
Anion gap: 13 (ref 5–15)
BUN: 25 mg/dL — ABNORMAL HIGH (ref 8–23)
CO2: 24 mmol/L (ref 22–32)
Calcium: 9.3 mg/dL (ref 8.9–10.3)
Chloride: 103 mmol/L (ref 98–111)
Creatinine: 1.22 mg/dL — ABNORMAL HIGH (ref 0.44–1.00)
GFR, Estimated: 47 mL/min — ABNORMAL LOW (ref >60)
Glucose, Bld: 107 mg/dL — ABNORMAL HIGH (ref 70–99)
Potassium: 3.1 mmol/L — ABNORMAL LOW (ref 3.5–5.1)
Sodium: 139 mmol/L (ref 135–145)
Total Bilirubin: 0.3 mg/dL (ref 0.0–1.2)
Total Protein: 6.2 g/dL — ABNORMAL LOW (ref 6.5–8.1)

## 2023-10-04 LAB — MAGNESIUM: Magnesium: 1.6 mg/dL — ABNORMAL LOW (ref 1.7–2.4)

## 2023-10-04 MED ORDER — POTASSIUM CHLORIDE IN NACL 20-0.9 MEQ/L-% IV SOLN
Freq: Once | INTRAVENOUS | Status: AC
  Filled 2023-10-04: qty 1000

## 2023-10-04 MED ORDER — ACETAMINOPHEN 325 MG PO TABS
650.0000 mg | ORAL_TABLET | Freq: Once | ORAL | Status: AC
  Administered 2023-10-04: 650 mg via ORAL
  Filled 2023-10-04: qty 2

## 2023-10-04 MED ORDER — HEPARIN SOD (PORK) LOCK FLUSH 100 UNIT/ML IV SOLN
500.0000 [IU] | Freq: Once | INTRAVENOUS | Status: AC | PRN
  Administered 2023-10-04: 500 [IU]

## 2023-10-04 MED ORDER — SODIUM CHLORIDE 0.9% FLUSH
10.0000 mL | Freq: Once | INTRAVENOUS | Status: AC | PRN
  Administered 2023-10-04: 10 mL

## 2023-10-04 MED ORDER — AMOXICILLIN 875 MG PO TABS
875.0000 mg | ORAL_TABLET | Freq: Two times a day (BID) | ORAL | 0 refills | Status: DC
  Filled 2023-10-04: qty 20, 10d supply, fill #0

## 2023-10-04 MED ORDER — MORPHINE SULFATE (PF) 2 MG/ML IV SOLN
1.0000 mg | Freq: Once | INTRAVENOUS | Status: AC
  Administered 2023-10-04: 1 mg via INTRAVENOUS
  Filled 2023-10-04: qty 1

## 2023-10-04 MED ORDER — MAGNESIUM SULFATE 2 GM/50ML IV SOLN
2.0000 g | Freq: Once | INTRAVENOUS | Status: AC
  Administered 2023-10-04: 2 g via INTRAVENOUS
  Filled 2023-10-04: qty 50

## 2023-10-04 NOTE — Assessment & Plan Note (Signed)
 Herpes zoster of the left inner thigh is dry and nearly resolved.  She will complete valacyclovir as prescribed.

## 2023-10-04 NOTE — Assessment & Plan Note (Signed)
 Mild recurrent hypokalemia, possibly due to previous chemotherapy versus GI losses versus decreased intake. She will receive IV potassium today.  She does not wish to take oral potassium supplement.  I gave her a list of potassium containing foods.  We will continue to check potassium weekly for now.

## 2023-10-04 NOTE — Patient Instructions (Signed)
 Magnesium Sulfate Injection What is this medication? MAGNESIUM SULFATE (mag NEE zee um SUL fate) prevents and treats low levels of magnesium in your body. It may also be used to prevent and treat seizures during pregnancy in people with high blood pressure disorders, such as preeclampsia or eclampsia. Magnesium plays an important role in maintaining the health of your muscles and nervous system. This medicine may be used for other purposes; ask your health care provider or pharmacist if you have questions. What should I tell my care team before I take this medication? They need to know if you have any of these conditions: Heart disease History of irregular heart beat Kidney disease An unusual or allergic reaction to magnesium sulfate, medications, foods, dyes, or preservatives Pregnant or trying to get pregnant Breast-feeding How should I use this medication? This medication is for infusion into a vein. It is given in a hospital or clinic setting. Talk to your care team about the use of this medication in children. While this medication may be prescribed for selected conditions, precautions do apply. Overdosage: If you think you have taken too much of this medicine contact a poison control center or emergency room at once. NOTE: This medicine is only for you. Do not share this medicine with others. What if I miss a dose? This does not apply. What may interact with this medication? Certain medications for anxiety or sleep Certain medications for seizures, such phenobarbital Digoxin Medications that relax muscles for surgery Narcotic medications for pain This list may not describe all possible interactions. Give your health care provider a list of all the medicines, herbs, non-prescription drugs, or dietary supplements you use. Also tell them if you smoke, drink alcohol, or use illegal drugs. Some items may interact with your medicine. What should I watch for while using this  medication? Your condition will be monitored carefully while you are receiving this medication. You may need blood work done while you are receiving this medication. What side effects may I notice from receiving this medication? Side effects that you should report to your care team as soon as possible: Allergic reactions--skin rash, itching, hives, swelling of the face, lips, tongue, or throat High magnesium level--confusion, drowsiness, facial flushing, redness, sweating, muscle weakness, fast or irregular heartbeat, trouble breathing Low blood pressure--dizziness, feeling faint or lightheaded, blurry vision Side effects that usually do not require medical attention (report to your care team if they continue or are bothersome): Headache Nausea This list may not describe all possible side effects. Call your doctor for medical advice about side effects. You may report side effects to FDA at 1-800-FDA-1088. Where should I keep my medication? This medication is given in a hospital or clinic and will not be stored at home. NOTE: This sheet is a summary. It may not cover all possible information. If you have questions about this medicine, talk to your doctor, pharmacist, or health care provider.  2024 Elsevier/Gold Standard (2020-11-25 00:00:00)

## 2023-10-04 NOTE — Assessment & Plan Note (Addendum)
 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  She was treated with pelvic vulvectomy, bilateral debulking, radical lymphadenectomy, and inguinal dissection in April 2025.  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  There was lymphovascular invasion and carcinoma in situ at the 6 o'clock margin with < 1 mm margin for invasive carcinoma. PET scan revealed metastatic left inguinal lymph nodes up to 1.8 cm in diameter, but no additional evidence of metastatic disease in the neck, chest, abdomen or pelvis. There was a 4 mm right lower lobe nodule, too small for PET resolution.   She was receiving concurrent chemoradiation with weekly cisplatin , as well as pembrolizuab every 3 weeks since her PDL 1 score is 85%, followed by maintenance pembrolizumab .  She had difficulty tolerating treatment due to nausea, vomiting and diarrhea, as well as hypomagnesemia.  We gave her additional IV fluids weekly and IV magnesium  as needed.  She developed mild pancytopenia felt to be most likely due to treatment.  After her third cycle of weekly cisplatin , she was hospitalized with sepsis due to recurrent cellulitis of the left vulva and thigh.  She then developed shingles of the left thigh.  She had pancytopenia at least in part due to chemotherapy.  At discharge on July 4, WBCs were 2.5, hemoglobin 8.7 and platelets 142,000.  She had hypokalemia and hypomagnesemia resolved with IV Supple Tatian.  She was discharged on doxycycline and valacyclovir.  Due to the multiple issues due to chemotherapy, we recommended stopping cisplatin  and transitioning to maintenance pembrolizumab  once radiation is complete.  She is feeling better at this time, however she has recurrent hypokalemia and hypomagnesemia and is clinically dehydrated.  She will receive IV fluids,  potassium and magnesium  today.  She does not wish to start oral potassium supplement, so we will hold off on that for now.  I gave her a list of potassium containing foods.  She wishes to delay starting radiation therapy until July 21.  We will plan to see her weekly with a CBC, comprehensive metabolic panel and magnesium  for continued supportive care.

## 2023-10-04 NOTE — Assessment & Plan Note (Addendum)
 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  She has had previous iron deficiency.  So I will repeat iron studies today.  I will also obtain B12 and folate.

## 2023-10-04 NOTE — Assessment & Plan Note (Signed)
 Mild recurrent hypomagnesemia, possibly due to previous cisplatin  chemotherapy versus GI losses versus decreased intake. She will receive IV magnesium  today. We will continue to check magnesium  weekly for now.

## 2023-10-04 NOTE — Assessment & Plan Note (Signed)
 Persistent cellulitis of the lower abdomen despite doxycycline. We recommend starting an alternative antibiotic. She did not tolerate cephalosporins due to diarrhea nor sulfa  drugs due to nausea.  She is amenable to trying amoxicillin .  I will place her on amoxicillin  875 mg twice daily for 10 days.  This may need to be extended if he does not have complete resolution when we see her next week.

## 2023-10-05 ENCOUNTER — Ambulatory Visit

## 2023-10-05 ENCOUNTER — Encounter: Payer: Self-pay | Admitting: Oncology

## 2023-10-05 ENCOUNTER — Other Ambulatory Visit: Payer: Self-pay | Admitting: Oncology

## 2023-10-05 ENCOUNTER — Inpatient Hospital Stay

## 2023-10-05 DIAGNOSIS — B3731 Acute candidiasis of vulva and vagina: Secondary | ICD-10-CM

## 2023-10-05 DIAGNOSIS — H35372 Puckering of macula, left eye: Secondary | ICD-10-CM | POA: Diagnosis not present

## 2023-10-05 MED ORDER — FLUCONAZOLE 100 MG PO TABS: 100.0000 mg | ORAL_TABLET | Freq: Every day | ORAL | 5 refills | Status: DC

## 2023-10-05 NOTE — Progress Notes (Signed)
 Progress Note  The patient has completed 11 out of 28 planned radiation treatments for her squamous cell carcinoma of the vulva, postoperative.  She was last treated on 09/19/2023, having missed multiple treatment secondary to cellulitis, diarrhea, and shingles.  ROS: She states that she is feeling somewhat better overall, though she continues to be moderately fatigued.  Her shingles are resolving, and are currently not painful.  Her diarrhea has largely subsided.  A/P: She is doing better overall, but wishes to defer radiation for another week or so.  Of note, she is no longer receiving chemotherapy.  Will tentatively arrange for her to restart radiation on July 21; I encouraged her to contact me in the interim with any questions or concerns she may have.    Dez Stauffer A. Jomarie, MD

## 2023-10-06 ENCOUNTER — Other Ambulatory Visit: Payer: Self-pay

## 2023-10-06 ENCOUNTER — Ambulatory Visit

## 2023-10-09 ENCOUNTER — Ambulatory Visit

## 2023-10-09 DIAGNOSIS — I10 Essential (primary) hypertension: Secondary | ICD-10-CM | POA: Diagnosis not present

## 2023-10-09 DIAGNOSIS — E7800 Pure hypercholesterolemia, unspecified: Secondary | ICD-10-CM | POA: Diagnosis not present

## 2023-10-09 DIAGNOSIS — L03119 Cellulitis of unspecified part of limb: Secondary | ICD-10-CM | POA: Diagnosis not present

## 2023-10-09 DIAGNOSIS — R7303 Prediabetes: Secondary | ICD-10-CM | POA: Diagnosis not present

## 2023-10-09 DIAGNOSIS — C519 Malignant neoplasm of vulva, unspecified: Secondary | ICD-10-CM | POA: Diagnosis not present

## 2023-10-09 DIAGNOSIS — D649 Anemia, unspecified: Secondary | ICD-10-CM | POA: Diagnosis not present

## 2023-10-10 ENCOUNTER — Ambulatory Visit

## 2023-10-10 ENCOUNTER — Ambulatory Visit: Admitting: Radiation Oncology

## 2023-10-10 DIAGNOSIS — L03315 Cellulitis of perineum: Secondary | ICD-10-CM | POA: Diagnosis not present

## 2023-10-10 DIAGNOSIS — D630 Anemia in neoplastic disease: Secondary | ICD-10-CM | POA: Diagnosis not present

## 2023-10-10 DIAGNOSIS — C519 Malignant neoplasm of vulva, unspecified: Secondary | ICD-10-CM | POA: Diagnosis not present

## 2023-10-10 DIAGNOSIS — A419 Sepsis, unspecified organism: Secondary | ICD-10-CM | POA: Diagnosis not present

## 2023-10-10 DIAGNOSIS — I10 Essential (primary) hypertension: Secondary | ICD-10-CM | POA: Diagnosis not present

## 2023-10-10 DIAGNOSIS — B029 Zoster without complications: Secondary | ICD-10-CM | POA: Diagnosis not present

## 2023-10-11 ENCOUNTER — Inpatient Hospital Stay (HOSPITAL_BASED_OUTPATIENT_CLINIC_OR_DEPARTMENT_OTHER): Admitting: Hematology and Oncology

## 2023-10-11 ENCOUNTER — Other Ambulatory Visit: Payer: Self-pay | Admitting: Hematology and Oncology

## 2023-10-11 ENCOUNTER — Inpatient Hospital Stay

## 2023-10-11 ENCOUNTER — Encounter: Payer: Self-pay | Admitting: Oncology

## 2023-10-11 ENCOUNTER — Encounter (HOSPITAL_BASED_OUTPATIENT_CLINIC_OR_DEPARTMENT_OTHER): Payer: Self-pay | Admitting: Pharmacy Technician

## 2023-10-11 ENCOUNTER — Other Ambulatory Visit: Payer: Self-pay

## 2023-10-11 ENCOUNTER — Other Ambulatory Visit (HOSPITAL_BASED_OUTPATIENT_CLINIC_OR_DEPARTMENT_OTHER): Payer: Self-pay

## 2023-10-11 ENCOUNTER — Ambulatory Visit

## 2023-10-11 VITALS — BP 142/72 | HR 61 | Temp 98.6°F | Resp 16 | Ht 63.0 in | Wt 211.4 lb

## 2023-10-11 VITALS — BP 145/78

## 2023-10-11 DIAGNOSIS — C519 Malignant neoplasm of vulva, unspecified: Secondary | ICD-10-CM

## 2023-10-11 DIAGNOSIS — E785 Hyperlipidemia, unspecified: Secondary | ICD-10-CM | POA: Diagnosis not present

## 2023-10-11 DIAGNOSIS — E860 Dehydration: Secondary | ICD-10-CM

## 2023-10-11 DIAGNOSIS — C50812 Malignant neoplasm of overlapping sites of left female breast: Secondary | ICD-10-CM | POA: Diagnosis not present

## 2023-10-11 DIAGNOSIS — R102 Pelvic and perineal pain: Secondary | ICD-10-CM

## 2023-10-11 DIAGNOSIS — D649 Anemia, unspecified: Secondary | ICD-10-CM

## 2023-10-11 DIAGNOSIS — Z170 Estrogen receptor positive status [ER+]: Secondary | ICD-10-CM | POA: Diagnosis not present

## 2023-10-11 LAB — CBC WITH DIFFERENTIAL (CANCER CENTER ONLY)
Abs Immature Granulocytes: 0.03 K/uL (ref 0.00–0.07)
Basophils Absolute: 0 K/uL (ref 0.0–0.1)
Basophils Relative: 0 %
Eosinophils Absolute: 0.1 K/uL (ref 0.0–0.5)
Eosinophils Relative: 2 %
HCT: 27 % — ABNORMAL LOW (ref 36.0–46.0)
Hemoglobin: 8.7 g/dL — ABNORMAL LOW (ref 12.0–15.0)
Immature Granulocytes: 1 %
Lymphocytes Relative: 22 %
Lymphs Abs: 0.8 K/uL (ref 0.7–4.0)
MCH: 28.3 pg (ref 26.0–34.0)
MCHC: 32.2 g/dL (ref 30.0–36.0)
MCV: 87.9 fL (ref 80.0–100.0)
Monocytes Absolute: 0.8 K/uL (ref 0.1–1.0)
Monocytes Relative: 22 %
Neutro Abs: 1.9 K/uL (ref 1.7–7.7)
Neutrophils Relative %: 53 %
Platelet Count: 299 K/uL (ref 150–400)
RBC: 3.07 MIL/uL — ABNORMAL LOW (ref 3.87–5.11)
RDW: 18.4 % — ABNORMAL HIGH (ref 11.5–15.5)
WBC Count: 3.6 K/uL — ABNORMAL LOW (ref 4.0–10.5)
nRBC: 0 % (ref 0.0–0.2)

## 2023-10-11 LAB — CMP (CANCER CENTER ONLY)
ALT: 30 U/L (ref 0–44)
AST: 24 U/L (ref 15–41)
Albumin: 3.8 g/dL (ref 3.5–5.0)
Alkaline Phosphatase: 92 U/L (ref 38–126)
Anion gap: 13 (ref 5–15)
BUN: 22 mg/dL (ref 8–23)
CO2: 25 mmol/L (ref 22–32)
Calcium: 9.4 mg/dL (ref 8.9–10.3)
Chloride: 103 mmol/L (ref 98–111)
Creatinine: 1.13 mg/dL — ABNORMAL HIGH (ref 0.44–1.00)
GFR, Estimated: 51 mL/min — ABNORMAL LOW (ref >60)
Glucose, Bld: 104 mg/dL — ABNORMAL HIGH (ref 70–99)
Potassium: 4 mmol/L (ref 3.5–5.1)
Sodium: 141 mmol/L (ref 135–145)
Total Bilirubin: 0.2 mg/dL (ref 0.0–1.2)
Total Protein: 6.4 g/dL — ABNORMAL LOW (ref 6.5–8.1)

## 2023-10-11 LAB — VITAMIN B12: Vitamin B-12: 700 pg/mL (ref 180–914)

## 2023-10-11 LAB — IRON AND TIBC
Iron: 61 ug/dL (ref 28–170)
Saturation Ratios: 14 % (ref 10.4–31.8)
TIBC: 441 ug/dL (ref 250–450)
UIBC: 380 ug/dL

## 2023-10-11 LAB — FOLATE: Folate: 6.9 ng/mL (ref >5.9)

## 2023-10-11 LAB — FERRITIN: Ferritin: 65 ng/mL (ref 11–307)

## 2023-10-11 LAB — MAGNESIUM: Magnesium: 1.8 mg/dL (ref 1.7–2.4)

## 2023-10-11 MED ORDER — SODIUM CHLORIDE 0.9 % IV SOLN: INTRAVENOUS | Status: DC

## 2023-10-11 MED ORDER — SODIUM CHLORIDE 0.9% FLUSH
3.0000 mL | Freq: Once | INTRAVENOUS | Status: AC | PRN
  Administered 2023-10-11: 10 mL

## 2023-10-11 MED ORDER — LIDOCAINE 5 % EX OINT
1.0000 | TOPICAL_OINTMENT | Freq: Two times a day (BID) | CUTANEOUS | 3 refills | Status: AC | PRN
  Filled 2023-10-11: qty 35.44, 18d supply, fill #0

## 2023-10-11 MED ORDER — HEPARIN SOD (PORK) LOCK FLUSH 100 UNIT/ML IV SOLN
250.0000 [IU] | Freq: Once | INTRAVENOUS | Status: AC | PRN
  Administered 2023-10-11: 500 [IU]

## 2023-10-11 MED ORDER — NYSTATIN 100000 UNIT/ML MT SUSP
5.0000 mL | Freq: Four times a day (QID) | OROMUCOSAL | 2 refills | Status: DC
  Filled 2023-10-11: qty 60, 3d supply, fill #0

## 2023-10-11 NOTE — Patient Instructions (Signed)

## 2023-10-12 ENCOUNTER — Other Ambulatory Visit (HOSPITAL_BASED_OUTPATIENT_CLINIC_OR_DEPARTMENT_OTHER): Payer: Self-pay

## 2023-10-12 ENCOUNTER — Ambulatory Visit

## 2023-10-12 ENCOUNTER — Inpatient Hospital Stay

## 2023-10-13 ENCOUNTER — Ambulatory Visit

## 2023-10-13 ENCOUNTER — Encounter: Payer: Self-pay | Admitting: Oncology

## 2023-10-13 DIAGNOSIS — L03315 Cellulitis of perineum: Secondary | ICD-10-CM | POA: Diagnosis not present

## 2023-10-13 DIAGNOSIS — D630 Anemia in neoplastic disease: Secondary | ICD-10-CM | POA: Diagnosis not present

## 2023-10-13 DIAGNOSIS — A419 Sepsis, unspecified organism: Secondary | ICD-10-CM | POA: Diagnosis not present

## 2023-10-13 DIAGNOSIS — C519 Malignant neoplasm of vulva, unspecified: Secondary | ICD-10-CM | POA: Diagnosis not present

## 2023-10-13 DIAGNOSIS — B029 Zoster without complications: Secondary | ICD-10-CM | POA: Diagnosis not present

## 2023-10-13 DIAGNOSIS — I10 Essential (primary) hypertension: Secondary | ICD-10-CM | POA: Diagnosis not present

## 2023-10-13 NOTE — Progress Notes (Signed)
 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  She was treated with pelvic vulvectomy, bilateral debulking, radical lymphadenectomy, and inguinal dissection in April 2025.  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  There was lymphovascular invasion and carcinoma in situ at the 6 o'clock margin with < 1 mm margin for invasive carcinoma. PET scan revealed metastatic left inguinal lymph nodes up to 1.8 cm in diameter, but no additional evidence of metastatic disease in the neck, chest, abdomen or pelvis. There was a 4 mm right lower lobe nodule, too small for PET resolution.    She was receiving concurrent chemoradiation with weekly cisplatin , as well as pembrolizuab every 3 weeks since her PDL 1 score is 85%, followed by maintenance pembrolizumab .  She had difficulty tolerating treatment due to nausea, vomiting and diarrhea, as well as hypomagnesemia.  We gave her additional IV fluids weekly and IV magnesium  as needed.  She developed mild pancytopenia felt to be most likely due to treatment.  After her third cycle of weekly cisplatin , she was hospitalized with sepsis due to recurrent cellulitis of the left vulva and thigh.  She then developed shingles of the left thigh.  She had pancytopenia at least in part due to chemotherapy.  At discharge on July 4, WBCs were 2.5, hemoglobin 8.7 and platelets 142,000.  She had hypokalemia and hypomagnesemia resolved with IV Supple Tatian.  She was discharged on doxycycline and valacyclovir.  Due to the multiple issues due to chemotherapy, we recommended stopping  cisplatin  and transitioning to maintenance pembrolizumab  once radiation is complete.   She is feeling better at this time, however she has recurrent hypokalemia and hypomagnesemia and is clinically dehydrated.  She will receive IV fluids today.  She wishes to delay radiation therapy and is asking to speak with Dr. Jomarie who agreed to delay radiation until July 31.  We will plan to see her weekly with a CBC, comprehensive metabolic panel and magnesium  for continued supportive care.     Hypomagnesemia Mild recurrent hypomagnesemia, possibly due to previous cisplatin  chemotherapy versus GI losses versus decreased intake. This is improved today.   Hypokalemia Mild recurrent hypokalemia, possibly due to previous chemotherapy versus GI losses versus decreased intake. This is improved today.  Cellulitis Persistent cellulitis of the lower abdomen despite doxycycline. We recommend starting an alternative antibiotic. She did not tolerate cephalosporins due to diarrhea nor sulfa  drugs due to nausea.  She is amenable to trying amoxicillin . She was placed on amoxicillin  875 mg twice daily for 10 days.  This may need to be extended if he does not have complete resolution when we see her next week.   Shingles Herpes zoster of the left inner thigh is dry and nearly resolved.  She will complete valacyclovir as prescribed.   Anemia due to antineoplastic chemotherapy Anemia most likely due to chemotherapy.  She has had previous iron deficiency.  This is slightly improved today.     The patient understands the plans discussed today and is in agreement with them.  She knows to contact our office if she develops concerns prior to her  next appointment.   I provided 20 minutes of face-to-face time during this encounter and > 50% was spent counseling as documented under my assessment and plan.    Doris DELENA Bach, NP  Doris [facility]. [facility] [address] [place] [address] Dept: [phone] Dept Fax: [phone]   No orders of the defined types were placed in this encounter.     CHIEF COMPLAINT:  CC: Stage IIIC vulvar cancer  Current Treatment: Concurrent chemoradiation with weekly cisplatin  and pembrolizumab  every 3 weeks  HISTORY OF PRESENT ILLNESS:  Doris Reyes 73 y.o. 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 PET scan revealed metastatic left inguinal lymph nodes up to 1.8 cm in diameter, but no additional evidence of metastatic disease in the neck, chest, abdomen or pelvis. There was a 4 mm right lower lobe nodule, too small for PET resolution.  She was treated vulvectomy, bilateral debulking, radical lymphadenectomy, and inguinal dissection in April 2025.  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  There was lymphovascular invasion and carcinoma in situ at the 6 o'clock margin with < 1 mm margin for invasive carcinoma.   She has a history of stage IIA hormone receptor positive right breast cancer diagnosed in March 2023.  Screening mammogram at [facility] in February 2023 revealed architectural distortion felt to be indeterminant.  Diagnostic mammogram March revealed mild nipple retraction and a 1 cm area of focal asymmetry in the retroareolar region.  Ultrasound revealed a hypoechoic irregular mass measuring 9 mm in that area.  Biopsy revealed grade 2, invasive mammary carcinoma, as well as mammary carcinoma in situ.  E-cadherin immunohistochemistry stains were negative, so this was consistent with invasive lobular carcinoma.  Estrogen receptors were positive at 95% and progesterone receptors positive at 95%.  HER2 by immunohistochemistry was positive  at 2+ but negative by FISH.  Ki-67 was less than 5%.  The carcinoma in situ was positive for E-cadherin, consistent with ductal carcinoma in situ.  MRI of the breast and was found to have focal enhancement in the right breast surrounding the biopsy clip and some non-mass enhancement posterior to the malignancy.  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 Endopredict testing revealed an EpClin score of 2.6, which is low risk. This correlates with a 5.1% risk of distant recurrence in the next 10 years, with only a 1.0% benefit of chemotherapy. Her risk of late recurrence is 4.0%. I recommended hormonal therapy, but she refused.   Due to her personal and family history of malignancy, she underwent testing for hereditary cancer syndromes with the [employer].  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  This may be associated with increased risk for colon cancer but the main concern is that she is a carrier with heterozygous mutation.  It was recommended that she have colonoscopy beginning at age 55 or 10 years prior to the age of her first-degree use relatives age at colorectal cancer diagnosis.  It was recommended this be repeated every 5 years.  This was explained to her.  Diagnostic bilateral mammogram done on 05/04/2023 was clear. Bone density scan done on 07/17/2023 revealed osteopenia.     Oncology History  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)  06/08/2021 Initial Diagnosis   Breast cancer in female [facility])   07/23/2021 Genetic Testing   Negative hereditary cancer genetic testing: no pathogenic variants detected in [facility] BRCAPlus Panel. Report date is July 23, 2021.  MUTYH [id number] single pathogenic mutation identified on the  CancerNext-Expanded+RNAinsight panel.  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  The report date is Jul 26, 2021.  The BRCAplus panel offered by [employer] and includes sequencing and deletion/duplication analysis for the following 8 genes: ATM, BRCA1, BRCA2, CDH1, CHEK2, PALB2, PTEN, and TP53.  Results of pan-cancer panel pending.   The CancerNext-Expanded gene panel offered by [facility] and includes sequencing and rearrangement analysis for the following 77 genes: AIP, ALK, APC*, ATM*, AXIN2, BAP1, BARD1, BLM, BMPR1A, BRCA1*, BRCA2*, BRIP1*, CDC73, CDH1*, CDK4, CDKN1B, CDKN2A, CHEK2*, CTNNA1, DICER1, FANCC, FH, FLCN, GALNT12, KIF1B, LZTR1, MAX, MEN1, MET, MLH1*, MSH2*, MSH3, MSH6*, MUTYH*, NBN, NF1*, NF2, NTHL1, PALB2*, PHOX2B, PMS2*, POT1, PRKAR1A, PTCH1, PTEN*, RAD51C*, RAD51D*, RB1, RECQL, RET, SDHA, SDHAF2, SDHB, SDHC, SDHD, SMAD4, SMARCA4, SMARCB1, SMARCE1, STK11, SUFU, TMEM127, TP53*, TSC1, TSC2, VHL and XRCC2 (sequencing and deletion/duplication); EGFR, EGLN1, HOXB13, KIT, MITF, PDGFRA, POLD1, and POLE (sequencing only); EPCAM and GREM1 (deletion/duplication only). DNA and RNA analyses performed for * genes.    08/23/2021 Cancer Staging   Staging form: Breast, AJCC 8th Edition - Pathologic stage from 08/23/2021: Stage IA (pT2(2), pN0(sn), cM0, G1, ER+, PR+, HER2-) - Signed by Cornelius Wanda DEL, MD on 09/29/2021 Histopathologic type: Lobular carcinoma, NOS Stage prefix: Initial diagnosis Method of lymph node assessment: Sentinel lymph node biopsy Nuclear grade: G1 Multigene prognostic tests performed: EndoPredict Histologic grading system: 3 grade system Residual tumor (R): R0 - None Laterality: Right Tumor size (mm): 24 Multiple tumors: Yes Number of tumors: 2 Lymph-vascular invasion (LVI): LVI not present (absent)/not identified Diagnostic confirmation: Positive histology PLUS positive immunophenotyping and/or positive genetic studies Specimen  type: Excision Staged by: Managing physician Menopausal status: Postmenopausal Ki-67 (%): 5 Stage used in treatment planning: Yes National guidelines used in treatment planning: Yes Type of national guideline used in treatment planning: NCCN   Vulvar cancer (HCC)  06/28/2023 Initial Diagnosis   Vulvar cancer (HCC)   06/28/2023 Cancer Staging   Staging form: Vulva, AJCC V9 - Clinical stage from 06/28/2023: FIGO Stage IIIC (cT1b, cN1c, cM0) - Signed by Cornelius Wanda DEL, MD on 07/31/2023 Histopathologic type: Squamous cell carcinoma, NOS Stage prefix: Initial diagnosis Method of lymph node assessment: Lymph node dissection Histologic grade (G): G2 Histologic grading system: 3 grade system Tumor size (mm): 30 Lymph-vascular invasion (LVI): LVI present/identified, NOS Diagnostic confirmation: Positive histology Specimen type: Excision Staged by: Managing physician Femoral-inguinal nodal status: Positive Solitary (s) or multifocal (m) tumors in the primary site: Solitary Perineural invasion (PNI): Unknown Stage used in treatment planning: Yes National guidelines used in treatment planning: Yes Type of national guideline used in treatment planning: NCCN   09/04/2023 - 09/18/2023 Chemotherapy   Patient is on Treatment Plan : CERVICAL Pembrolizumab  q21d + XRT (cisplatin  d/c'd 09/25/23)     10/23/2023 -  Chemotherapy   Patient is on Treatment Plan : CERVICAL Pembrolizumab  (200) q21d         INTERVAL HISTORY:   Doris Reyes is here today for repeat clinical assessment prior to  a second cycle of pembrolizumab .  She had severe difficulty tolerating chemotherapy due to nausea and vomiting.  She did not tolerate ondansetron  or prochlorperazine .  Ativan  and Reglan were ineffective.  She was hospitalized on June 26 after a third cycle of weekly cisplatin  with sepsis due to recurrent cellulitis of the left vulva and thigh.  She then developed shingles of the left thigh.  She had anemia which worsened with  IV hydration.  She had pancytopenia likely due to chemotherapy, with WBC of 3.7, hemoglobin 10.8 and platelets 128,000.  Her anemia worsened with hydration.  Hemoglobin was 8.7 on discharge on July 4.  WBCs were lower on discharge at 2.5, but platelets had normalized.  She developed bilateral lower extremity edema, so fluids were discontinued and she was given furosemide.  Her kidney function worsened on July 3, so she was kept an additional day for further IV fluids.  Her creatinine improved from 1.66 to 1.3.  She also had hypokalemia and hypomagnesemia corrected with IV supplementation.  She was discharged on doxycycline and valacyclovir.  Due to the multiple issues with chemotherapy, we recommended stopping cisplatin .  We plan to give maintenance pembrolizumab  once radiation is complete.   She denies fevers or chills.  She states her shingles is drying up.  Her lower abdominal skin remains red and slightly warm.  She reports an episode of vomiting this morning, but denies continued nausea, vomiting or diarrhea.  She denies pain. Her appetite is good. Her weight is stable. She states she does not wish to take oral potassium supplement.  She states she does not feel ready to start radiation at this time.  She is seeing Dr. Jomarie after our visit today.   REVIEW OF SYSTEMS:   Review of Systems  Constitutional:  Negative for appetite change, chills, diaphoresis, fatigue, fever and unexpected weight change.  HENT:   Negative for lump/mass, mouth sores, nosebleeds, sore throat and trouble swallowing.   Respiratory:  Positive for shortness of breath (with exertion). Negative for cough and hemoptysis.   Cardiovascular:  Positive for leg swelling. Negative for chest pain and palpitations.  Gastrointestinal:  Negative for abdominal pain, blood in stool, constipation, diarrhea, nausea and vomiting.  Genitourinary:  Negative for difficulty urinating, dysuria, frequency, hematuria and vaginal bleeding.    Musculoskeletal:  Positive for gait problem (chronic balance issues). Negative for arthralgias, back pain, myalgias and neck pain.  Skin:  Positive for rash (shingles).  Neurological:  Positive for gait problem (chronic balance issues), headaches (intermittent since chemo, severe today), light-headedness and numbness (neuropathy of the feet). Negative for dizziness and extremity weakness.  Hematological:  Negative for adenopathy. Does not bruise/bleed easily.  Psychiatric/Behavioral:  Negative for depression and sleep disturbance. The patient is not nervous/anxious.      VITALS:   Blood pressure (!) 142/72, pulse 61, temperature 98.6 F (37 C), temperature source Oral, resp. rate 16, height 5' 3 (1.6 m), weight 211 lb 6.4 oz (95.9 kg), SpO2 97%.  Wt Readings from Last 3 Encounters:  10/11/23 211 lb 6.4 oz (95.9 kg)  10/04/23 211 lb (95.7 kg)  09/18/23 214 lb 6.4 oz (97.3 kg)    Body mass index is 37.45 kg/m.  Performance status (ECOG): 2 - Symptomatic, <50% confined to bed    PHYSICAL EXAM:   Physical Exam Vitals and nursing note reviewed.  Constitutional:      General: She is not in acute distress.    Appearance: Normal appearance.  HENT:  Head: Normocephalic and atraumatic.     Mouth/Throat:     Mouth: Mucous membranes are moist.     Pharynx: Oropharynx is clear. No oropharyngeal exudate or posterior oropharyngeal erythema.  Eyes:     General: No scleral icterus.    Extraocular Movements: Extraocular movements intact.     Conjunctiva/sclera: Conjunctivae normal.     Pupils: Pupils are equal, round, and reactive to light.  Cardiovascular:     Rate and Rhythm: Normal rate and regular rhythm. Frequent Extrasystoles are present.    Heart sounds: Normal heart sounds. No murmur heard.    No friction rub. No gallop.  Pulmonary:     Effort: Pulmonary effort is normal.     Breath sounds: Normal breath sounds. No wheezing, rhonchi or rales.  Abdominal:     General: There  is no distension.     Palpations: Abdomen is soft. There is no hepatomegaly, splenomegaly or mass.     Tenderness: There is no abdominal tenderness.  Musculoskeletal:        General: Normal range of motion.     Cervical back: Normal range of motion and neck supple. No tenderness.     Right lower leg: No edema.     Left lower leg: No edema.  Lymphadenopathy:     Cervical: No cervical adenopathy.     Upper Body:     Right upper body: No supraclavicular or axillary adenopathy.     Left upper body: No supraclavicular or axillary adenopathy.     Lower Body: No right inguinal adenopathy. No left inguinal adenopathy.  Skin:    General: Skin is warm and dry.     Coloration: Skin is not jaundiced.     Findings: Erythema and rash present.     Comments: Persistent erythema with mild overlying warmth of the lower abdomen.  Herpes zoster is drying up and nearly resolved.  There is a rash of the bilateral lower extremities, most consistent with stasis changes  Neurological:     Mental Status: She is alert and oriented to person, place, and time.     Cranial Nerves: No cranial nerve deficit.  Psychiatric:        Mood and Affect: Mood normal.        Behavior: Behavior normal.        Thought Content: Thought content normal.     LABS:      Latest Ref Rng & Units 10/11/2023   12:45 PM 10/04/2023    8:50 AM 09/18/2023    8:15 AM  CBC  WBC 4.0 - 10.5 K/uL 3.6  2.7  3.3   Hemoglobin 12.0 - 15.0 g/dL 8.7  8.5  89.8   Hematocrit 36.0 - 46.0 % 27.0  25.9  30.7   Platelets 150 - 400 K/uL 299  169  121       Latest Ref Rng & Units 10/11/2023   12:45 PM 10/04/2023    8:50 AM 09/18/2023    8:15 AM  CMP  Glucose 70 - 99 mg/dL 895  892  899   BUN 8 - 23 mg/dL 22  25  17    Creatinine 0.44 - 1.00 mg/dL 8.86  8.77  9.00   Sodium 135 - 145 mmol/L 141  139  138   Potassium 3.5 - 5.1 mmol/L 4.0  3.1  4.1   Chloride 98 - 111 mmol/L 103  103  104   CO2 22 - 32 mmol/L 25  24  24    Calcium   8.9 - 10.3 mg/dL 9.4   9.3  9.3   Total Protein 6.5 - 8.1 g/dL 6.4  6.2  6.0   Total Bilirubin 0.0 - 1.2 mg/dL 0.2  0.3  0.2   Alkaline Phos 38 - 126 U/L 92  80  67   AST 15 - 41 U/L 24  24  21    ALT 0 - 44 U/L 30  26  21       No results found for: CEA1, CEA / No results found for: CEA1, CEA No results found for: PSA1 No results found for: CAN199 No results found for: CAN125  No results found for: TOTALPROTELP, ALBUMINELP, A1GS, A2GS, BETS, BETA2SER, GAMS, MSPIKE, SPEI Lab Results  Component Value Date   TIBC 441 10/11/2023   FERRITIN 65 10/11/2023   IRONPCTSAT 14 10/11/2023   No results found for: LDH  STUDIES:   No results found.    HISTORY:   Past Medical History:  Diagnosis Date   Anemia    Anxiety    Cancer (HCC)    right breast ILC   Complication of anesthesia    difficulty waking up   Depression    Diverticulitis    Family history of breast cancer    Family history of ovarian cancer    Family history of prostate cancer    Fibromyalgia    GERD (gastroesophageal reflux disease)    Heart murmur    History of colonic polyps    History of hiatal hernia    Hyperlipidemia    Hypertension    IBS (irritable bowel syndrome)    Ischemic colitis (HCC) 03/2012   Osteoporosis    Pre-diabetes    RLS (restless legs syndrome)    Sinusitis    Sleep apnea    CPAP nightly    Past Surgical History:  Procedure Laterality Date   BREAST LUMPECTOMY WITH RADIOACTIVE SEED LOCALIZATION Right 08/04/2021   Procedure: RIGHT BREAST LUMPECTOMY WITH RADIOACTIVE SEED LOCALIZATION;  Surgeon: Vanderbilt Ned, MD;  Location: [facility];  Service: General;  Laterality: Right;   CHOLECYSTECTOMY  2004   COLONOSCOPY WITH PROPOFOL  N/A 04/18/2017   Procedure: COLONOSCOPY WITH PROPOFOL ;  Surgeon: Kristie Lamprey, MD;  Location: [facility];  Service: Endoscopy;  Laterality: N/A;   ESOPHAGOGASTRODUODENOSCOPY (EGD) WITH PROPOFOL  N/A 04/18/2017   Procedure: ESOPHAGOGASTRODUODENOSCOPY  (EGD) WITH PROPOFOL ;  Surgeon: Kristie Lamprey, MD;  Location: [facility];  Service: Endoscopy;  Laterality: N/A;   EXAM UNDER ANESTHESIA, PELVIC N/A 06/28/2023   Procedure: EXAM UNDER ANESTHESIA, PELVIC;  Surgeon: Viktoria Comer SAUNDERS, MD;  Location: [facility];  Service: Gynecology;  Laterality: N/A;   FLEXIBLE SIGMOIDOSCOPY  04/20/2012   Procedure: FLEXIBLE SIGMOIDOSCOPY;  Surgeon: Belvie JONETTA Just, MD;  Location: [facility];  Service: Endoscopy;  Laterality: N/A;   INGUINAL LYMPHADENECTOMY N/A 06/28/2023   Procedure: LYMPHADENECTOMY, INGUINAL, OPEN;  Surgeon: Viktoria Comer SAUNDERS, MD;  Location: [facility];  Service: Gynecology;  Laterality: N/A;   IR IMAGING GUIDED PORT INSERTION  07/31/2023   NISSEN FUNDOPLICATION  2011   PARAESOPHAGEAL HERNIA REPAIR  09/11/2009   and Nissen fundoplication   RADICAL VULVECTOMY N/A 06/28/2023   Procedure: VULVECTOMY, RADICAL;  Surgeon: Viktoria Comer SAUNDERS, MD;  Location: [facility];  Service: Gynecology;  Laterality: N/A;  Possible skin flap   RE-EXCISION OF BREAST LUMPECTOMY Right 08/18/2021   Procedure: RE-EXCISION RIGHT BREAST LUMPECTOMY;  Surgeon: Vanderbilt Ned, MD;  Location: [facility];  Service: General;  Laterality: Right;   SENTINEL NODE BIOPSY N/A 08/04/2021  Procedure: SENTINEL NODE BIOPSY;  Surgeon: Vanderbilt Ned, MD;  Location: [facility];  Service: General;  Laterality: N/A;   TONSILLECTOMY  1960's   TOTAL ABDOMINAL HYSTERECTOMY  2001   w/ BSO    Family History  Problem Relation Age of Onset   Colon cancer Mother 26   Alzheimer's disease Mother    Heart attack Father    Cancer Maternal Aunt        x2   Alzheimer's disease Maternal Aunt    Ovarian cancer Maternal Aunt    Prostate cancer Maternal Uncle    Kidney disease Maternal Uncle    Atrial fibrillation Maternal Uncle    Stroke Paternal Uncle    Aneurysm Paternal Grandmother        brain   Stroke Paternal Grandfather    Prostate cancer Cousin        paternal first cousin    Prostate cancer Cousin        paternal first cousin   Esophageal cancer Cousin        maternal first cousin   Breast cancer Cousin        DCIS, maternal first cousin   Ovarian cancer Cousin        maternal first cousin   Rheum arthritis Other    Lung disease Neg Hx     Social History:  reports that she has never smoked. She has never used smokeless tobacco. She reports that she does not drink alcohol and does not use drugs.The patient is accompanied by her daughter, Jeoffrey, today.  Allergies:  Allergies  Allergen Reactions   Ceftin [Cefuroxime Axetil] Diarrhea   Cefuroxime Other (See Comments)    IBS   Compazine  [Prochlorperazine ] Other (See Comments)    Vision problems   Other Other (See Comments)    Pneumonia vaccine (uncoded)   Sulfa  Antibiotics Nausea And Vomiting   Tramadol Hcl Nausea And Vomiting   Zofran  [Ondansetron ] Other (See Comments)    Vision changes    Current Medications: Current Outpatient Medications  Medication Sig Dispense Refill   acetaminophen  (TYLENOL ) 650 MG CR tablet 1,300 mg in the morning and at bedtime.     amLODipine  (NORVASC ) 5 MG tablet Take 5 mg by mouth daily.     Ascorbic Acid (VITAMIN C) 500 MG CHEW Chew 500 mg by mouth daily.     aspirin EC 81 MG tablet Take 81 mg by mouth at bedtime. Swallow whole.     atenolol  (TENORMIN ) 50 MG tablet Take 75 mg by mouth daily.      benazepril  (LOTENSIN ) 10 MG tablet Take 10 mg by mouth daily.     benzonatate (TESSALON) 100 MG capsule Take 100-200 mg by mouth at bedtime.     Biotin 1000 MCG tablet Take 1,000 mcg by mouth daily.     Calcium  Citrate-Vitamin D (CITRACAL + D PO) Take 1 tablet by mouth daily.     cetirizine (ZYRTEC) 10 MG tablet Take 10 mg by mouth at bedtime.     Cholecalciferol (VITAMIN D) 50 MCG (2000 UT) tablet Take 2,000 Units by mouth daily.     cyanocobalamin 1000 MCG tablet Take 1,000 mcg by mouth daily.     diclofenac sodium (VOLTAREN) 1 % GEL Apply 1 application  topically daily as  needed (for pain).     dicyclomine (BENTYL) 10 MG capsule Take 1 capsule by mouth as needed.     diphenhydrAMINE  (BENADRYL ) 25 MG tablet Take 25 mg by mouth every 6 (six) hours as  needed for allergies.     diphenoxylate-atropine (LOMOTIL) 2.5-0.025 MG tablet Take 2 tablets by mouth 4 (four) times daily as needed for diarrhea or loose stools.     doxycycline (MONODOX) 100 MG capsule Take 100 mg by mouth 2 (two) times daily.     DULoxetine  (CYMBALTA ) 60 MG capsule Take 120 mg by mouth at bedtime.     famotidine  (PEPCID ) 20 MG tablet Take 20 mg by mouth at bedtime.     fexofenadine (ALLEGRA) 180 MG tablet Take 180 mg by mouth in the morning.     fluconazole  (DIFLUCAN ) 100 MG tablet Take 1 tablet (100 mg total) by mouth daily. 10 tablet 5   fluticasone  (FLONASE ) 50 MCG/ACT nasal spray Place 1 spray into both nostrils daily.     lidocaine  4 % Place 1 patch onto the skin daily.     LORazepam  (ATIVAN ) 1 MG tablet Take 1 tablet (1 mg total) by mouth every 8 (eight) hours. 30 tablet 0   NON FORMULARY Pt uses a c-pap nightly     Nutritional Supplements (JUICE PLUS FIBRE PO) Take 2 each by mouth daily. Fruits and Vegetables     nystatin  (MYCOSTATIN /NYSTOP ) powder Apply 1 Application topically 3 (three) times daily. 30 g 5   Omega-3 Fatty Acids (FISH OIL) 1000 MG CAPS Take 1,000 mg by mouth daily.      pantoprazole  (PROTONIX ) 40 MG tablet Take 40 mg by mouth every evening.     Phenylephrine -APAP-Guaifenesin (TYLENOL  SINUS SEVERE PO) Take 2 tablets by mouth daily as needed (drainage).     polyvinyl alcohol (LIQUIFILM TEARS) 1.4 % ophthalmic solution Place 1 drop into both eyes 5 (five) times daily.     Probiotic Product (PROBIOTIC & ACIDOPHILUS EX ST PO) Take 1 capsule by mouth daily. Ultra Flora Plus Capsules     Propylene Glycol (SYSTANE COMPLETE) 0.6 % SOLN Place 1 drop into both eyes in the morning and at bedtime.     rOPINIRole  (REQUIP ) 0.5 MG tablet Take 0.5 mg by mouth at bedtime.     senna-docusate  (SENOKOT-S) 8.6-50 MG tablet Take 2 tablets by mouth at bedtime. For AFTER surgery, do not take if having diarrhea 30 tablet 0   simvastatin  (ZOCOR ) 10 MG tablet Take 10 mg by mouth at bedtime.       sucralfate  (CARAFATE ) 1 GM/10ML suspension Take 1 g by mouth 2 (two) times daily.     traZODone  (DESYREL ) 50 MG tablet Take 50 mg by mouth at bedtime.     triamcinolone  (KENALOG ) 0.025 % ointment Apply 1 Application topically 2 (two) times daily. 30 g 0   valACYclovir (VALTREX) 1000 MG tablet Take 1,000 mg by mouth 3 (three) times daily.     vitamin E 400 UNIT capsule Take 400 Units by mouth daily.       White Petrolatum -Mineral Oil (REFRESH P.M. OP) Place 1 Application into both eyes at bedtime.     amoxicillin  (AMOXIL ) 875 MG tablet Take 1 tablet (875 mg total) by mouth 2 (two) times daily. 20 tablet 0   EPINEPHrine  (EPIPEN  2-PAK) 0.3 mg/0.3 mL IJ SOAJ injection Inject 0.3 mg into the muscle as needed for anaphylaxis. (Patient not taking: Reported on 09/11/2023)     lidocaine  (XYLOCAINE ) 5 % ointment Apply 1 Application topically 2 (two) times daily as needed for moderate pain (pain score 4-6) (to the vulva). Apply to vulva as needed for discomfort 35.44 g 3   nystatin  (MYCOSTATIN ) 100000 UNIT/ML suspension Take 5 mLs (500,000 Units  total) by mouth 4 (four) times daily. 60 mL 2   No current facility-administered medications for this visit.

## 2023-10-14 ENCOUNTER — Other Ambulatory Visit: Payer: Self-pay

## 2023-10-16 ENCOUNTER — Ambulatory Visit

## 2023-10-16 DIAGNOSIS — C519 Malignant neoplasm of vulva, unspecified: Secondary | ICD-10-CM | POA: Diagnosis not present

## 2023-10-16 DIAGNOSIS — B029 Zoster without complications: Secondary | ICD-10-CM | POA: Diagnosis not present

## 2023-10-16 DIAGNOSIS — L03315 Cellulitis of perineum: Secondary | ICD-10-CM | POA: Diagnosis not present

## 2023-10-16 DIAGNOSIS — D630 Anemia in neoplastic disease: Secondary | ICD-10-CM | POA: Diagnosis not present

## 2023-10-16 DIAGNOSIS — A419 Sepsis, unspecified organism: Secondary | ICD-10-CM | POA: Diagnosis not present

## 2023-10-16 DIAGNOSIS — I10 Essential (primary) hypertension: Secondary | ICD-10-CM | POA: Diagnosis not present

## 2023-10-17 ENCOUNTER — Ambulatory Visit

## 2023-10-17 ENCOUNTER — Other Ambulatory Visit: Payer: Self-pay | Admitting: Hematology and Oncology

## 2023-10-17 DIAGNOSIS — I10 Essential (primary) hypertension: Secondary | ICD-10-CM | POA: Diagnosis not present

## 2023-10-17 DIAGNOSIS — B029 Zoster without complications: Secondary | ICD-10-CM | POA: Diagnosis not present

## 2023-10-17 DIAGNOSIS — C519 Malignant neoplasm of vulva, unspecified: Secondary | ICD-10-CM

## 2023-10-17 DIAGNOSIS — D630 Anemia in neoplastic disease: Secondary | ICD-10-CM | POA: Diagnosis not present

## 2023-10-17 DIAGNOSIS — L03315 Cellulitis of perineum: Secondary | ICD-10-CM | POA: Diagnosis not present

## 2023-10-17 DIAGNOSIS — A419 Sepsis, unspecified organism: Secondary | ICD-10-CM | POA: Diagnosis not present

## 2023-10-18 ENCOUNTER — Inpatient Hospital Stay (HOSPITAL_BASED_OUTPATIENT_CLINIC_OR_DEPARTMENT_OTHER): Admitting: Hematology and Oncology

## 2023-10-18 ENCOUNTER — Encounter: Payer: Self-pay | Admitting: Hematology and Oncology

## 2023-10-18 ENCOUNTER — Other Ambulatory Visit

## 2023-10-18 ENCOUNTER — Ambulatory Visit

## 2023-10-18 ENCOUNTER — Telehealth: Payer: Self-pay | Admitting: Hematology and Oncology

## 2023-10-18 ENCOUNTER — Inpatient Hospital Stay

## 2023-10-18 VITALS — BP 140/69 | HR 60 | Temp 98.0°F | Resp 18 | Ht 63.0 in | Wt 206.7 lb

## 2023-10-18 DIAGNOSIS — C519 Malignant neoplasm of vulva, unspecified: Secondary | ICD-10-CM

## 2023-10-18 DIAGNOSIS — T451X5A Adverse effect of antineoplastic and immunosuppressive drugs, initial encounter: Secondary | ICD-10-CM

## 2023-10-18 DIAGNOSIS — E860 Dehydration: Secondary | ICD-10-CM

## 2023-10-18 DIAGNOSIS — Z170 Estrogen receptor positive status [ER+]: Secondary | ICD-10-CM | POA: Diagnosis not present

## 2023-10-18 DIAGNOSIS — D6481 Anemia due to antineoplastic chemotherapy: Secondary | ICD-10-CM

## 2023-10-18 DIAGNOSIS — Z510 Encounter for antineoplastic radiation therapy: Secondary | ICD-10-CM | POA: Diagnosis not present

## 2023-10-18 DIAGNOSIS — E785 Hyperlipidemia, unspecified: Secondary | ICD-10-CM | POA: Diagnosis not present

## 2023-10-18 DIAGNOSIS — C50812 Malignant neoplasm of overlapping sites of left female breast: Secondary | ICD-10-CM | POA: Diagnosis not present

## 2023-10-18 DIAGNOSIS — Z95828 Presence of other vascular implants and grafts: Secondary | ICD-10-CM

## 2023-10-18 LAB — CMP (CANCER CENTER ONLY)
ALT: 17 U/L (ref 0–44)
AST: 18 U/L (ref 15–41)
Albumin: 4 g/dL (ref 3.5–5.0)
Alkaline Phosphatase: 83 U/L (ref 38–126)
Anion gap: 12 (ref 5–15)
BUN: 25 mg/dL — ABNORMAL HIGH (ref 8–23)
CO2: 24 mmol/L (ref 22–32)
Calcium: 9.6 mg/dL (ref 8.9–10.3)
Chloride: 104 mmol/L (ref 98–111)
Creatinine: 1.06 mg/dL — ABNORMAL HIGH (ref 0.44–1.00)
GFR, Estimated: 55 mL/min — ABNORMAL LOW (ref >60)
Glucose, Bld: 101 mg/dL — ABNORMAL HIGH (ref 70–99)
Potassium: 4.3 mmol/L (ref 3.5–5.1)
Sodium: 140 mmol/L (ref 135–145)
Total Bilirubin: 0.3 mg/dL (ref 0.0–1.2)
Total Protein: 6.4 g/dL — ABNORMAL LOW (ref 6.5–8.1)

## 2023-10-18 LAB — CBC WITH DIFFERENTIAL (CANCER CENTER ONLY)
Abs Immature Granulocytes: 0.04 K/uL (ref 0.00–0.07)
Basophils Absolute: 0 K/uL (ref 0.0–0.1)
Basophils Relative: 0 %
Eosinophils Absolute: 0.1 K/uL (ref 0.0–0.5)
Eosinophils Relative: 2 %
HCT: 28.2 % — ABNORMAL LOW (ref 36.0–46.0)
Hemoglobin: 9.3 g/dL — ABNORMAL LOW (ref 12.0–15.0)
Immature Granulocytes: 1 %
Lymphocytes Relative: 19 %
Lymphs Abs: 0.7 K/uL (ref 0.7–4.0)
MCH: 29.2 pg (ref 26.0–34.0)
MCHC: 33 g/dL (ref 30.0–36.0)
MCV: 88.4 fL (ref 80.0–100.0)
Monocytes Absolute: 0.8 K/uL (ref 0.1–1.0)
Monocytes Relative: 19 %
Neutro Abs: 2.3 K/uL (ref 1.7–7.7)
Neutrophils Relative %: 59 %
Platelet Count: 221 K/uL (ref 150–400)
RBC: 3.19 MIL/uL — ABNORMAL LOW (ref 3.87–5.11)
RDW: 18.6 % — ABNORMAL HIGH (ref 11.5–15.5)
WBC Count: 3.9 K/uL — ABNORMAL LOW (ref 4.0–10.5)
nRBC: 0 % (ref 0.0–0.2)

## 2023-10-18 LAB — MAGNESIUM: Magnesium: 2.1 mg/dL (ref 1.7–2.4)

## 2023-10-18 MED ORDER — SODIUM CHLORIDE FLUSH 0.9 % IV SOLN
10.0000 mL | Freq: Once | INTRAVENOUS | Status: AC
  Administered 2023-10-18: 10 mL via INTRAVENOUS

## 2023-10-18 MED ORDER — SODIUM CHLORIDE 0.9 % IV SOLN: INTRAVENOUS | Status: DC

## 2023-10-18 MED ORDER — HEPARIN SOD (PORK) LOCK FLUSH 100 UNIT/ML IV SOLN
500.0000 [IU] | Freq: Once | INTRAVENOUS | Status: AC
  Administered 2023-10-18: 500 [IU] via INTRAVENOUS

## 2023-10-18 NOTE — Telephone Encounter (Signed)
 Patient has been scheduled for follow-up visit per 10/18/23 LOS.  Pt given an appt calendar with date and time.

## 2023-10-18 NOTE — Patient Instructions (Signed)

## 2023-10-18 NOTE — Progress Notes (Signed)
 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  She was treated with pelvic vulvectomy, bilateral debulking, radical lymphadenectomy, and inguinal dissection in April 2025.  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  There was lymphovascular invasion and carcinoma in situ at the 6 o'clock margin with < 1 mm margin for invasive carcinoma. PET scan revealed metastatic left inguinal lymph nodes up to 1.8 cm in diameter, but no additional evidence of metastatic disease in the neck, chest, abdomen or pelvis. There was a 4 mm right lower lobe nodule, too small for PET resolution.   She was receiving concurrent chemoradiation with weekly cisplatin , as well as pembrolizuab every 3 weeks since her PDL 1 score is 85%, followed by maintenance pembrolizumab .  She had difficulty tolerating treatment due to nausea, vomiting and diarrhea, as well as hypomagnesemia.  We gave her additional IV fluids weekly and IV magnesium  as needed.  She developed mild pancytopenia felt to be most likely due to treatment.  After her third cycle of weekly cisplatin , she was hospitalized with sepsis due to recurrent cellulitis of the left vulva and thigh.  She then developed shingles of the left thigh.  She had pancytopenia at least in part due to chemotherapy.  At discharge on July 4, WBCs were 2.5, hemoglobin 8.7 and platelets 142,000.  She had hypokalemia and hypomagnesemia resolved with IV Supple Tatian.  She was discharged on doxycycline and valacyclovir.  Due to the multiple issues due to chemotherapy, we recommended stopping  cisplatin  and transitioning to maintenance pembrolizumab  once radiation is complete.  The patient delayed her radiation therapy again until next week.  At this point, she states she does not desire any further radiation or immunotherapy, as she does not feel she can tolerate it. She remains without evidence of recurrence. There is no obvious cause of the pruritis.  Still we are concerned about the risk of recurrence without continued treatment. I will discuss with Dr. Cornelius. We will plan to see her weekly with a CBC, comprehensive metabolic panel and magnesium  for continued supportive care.   Anemia due to antineoplastic chemotherapy Anemia most likely due to chemotherapy.  She has had previous iron deficiency.  Repeat iron studies did not reveal recurrent iron deficiency.  B12 and folate were normal. Her hemoglobin is slightly better today.  Dehydration She is clinically dehydrated, so I will give her IV fluids again today. I encouraged her to continue to increase her clear liquid intake.    The patient understands the plans discussed today and is in agreement with them.  She knows to contact our office if she develops concerns prior to her next appointment.   I provided 30 minutes of face-to-face time during this encounter and > 50% was spent counseling as documented under my assessment and plan.    Kharon Hixon A Lorrain Rivers, PA-C  [facility] [place] - A DEPT OF [facility]. [facility] [address] [place] [address] Dept: [phone] Dept Fax: [phone]   No orders of the defined types were placed in this encounter.  CHIEF COMPLAINT:  CC: Stage IIIC vulvar cancer  Current Treatment: Chemoradiation on hold  HISTORY OF PRESENT ILLNESS:   Oncology History  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)  06/08/2021 Initial Diagnosis   Breast cancer in female [facility])   07/23/2021 Genetic Testing   Negative hereditary cancer genetic testing:  no pathogenic variants detected in [facility] BRCAPlus Panel. Report date is July 23, 2021.  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  The report date is Jul 26, 2021.  The BRCAplus panel offered by [facility] and includes sequencing and deletion/duplication analysis for the following 8 genes: ATM, BRCA1, BRCA2, CDH1, CHEK2, PALB2, PTEN, and TP53.  Results of pan-cancer panel pending.   The CancerNext-Expanded gene panel offered by [facility] and includes sequencing and rearrangement analysis for the following 77 genes: AIP, ALK, APC*, ATM*, AXIN2, BAP1, BARD1, BLM, BMPR1A, BRCA1*, BRCA2*, BRIP1*, CDC73, CDH1*, CDK4, CDKN1B, CDKN2A, CHEK2*, CTNNA1, DICER1, FANCC, FH, FLCN, GALNT12, KIF1B, LZTR1, MAX, MEN1, MET, MLH1*, MSH2*, MSH3, MSH6*, MUTYH*, NBN, NF1*, NF2, NTHL1, PALB2*, PHOX2B, PMS2*, POT1, PRKAR1A, PTCH1, PTEN*, RAD51C*, RAD51D*, RB1, RECQL, RET, SDHA, SDHAF2, SDHB, SDHC, SDHD, SMAD4, SMARCA4, SMARCB1, SMARCE1, STK11, SUFU, TMEM127, TP53*, TSC1, TSC2, VHL and XRCC2 (sequencing and deletion/duplication); EGFR, EGLN1, HOXB13, KIT, MITF, PDGFRA, POLD1, and POLE (sequencing only); EPCAM and GREM1 (deletion/duplication only). DNA and RNA analyses performed for * genes.    08/23/2021 Cancer Staging   Staging form: Breast, AJCC 8th Edition - Pathologic stage from 08/23/2021: Stage IA (pT2(2), pN0(sn), cM0, G1, ER+, PR+, HER2-) - Signed by Cornelius Wanda DEL, MD on 09/29/2021 Histopathologic type: Lobular carcinoma, NOS Stage prefix: Initial diagnosis Method of lymph node assessment: Sentinel lymph node biopsy Nuclear grade: G1 Multigene prognostic tests performed: EndoPredict Histologic grading system: 3 grade system Residual tumor (R): R0 - None Laterality: Right Tumor size (mm): 24 Multiple tumors: Yes Number of tumors: 2 Lymph-vascular invasion (LVI): LVI  not present (absent)/not identified Diagnostic confirmation: Positive histology PLUS positive immunophenotyping and/or positive genetic studies Specimen type: Excision Staged by: Managing physician Menopausal status: Postmenopausal Ki-67 (%): 5 Stage used in treatment planning: Yes National guidelines used in treatment planning: Yes Type of national guideline used in treatment planning: NCCN   Vulvar cancer (HCC)  06/28/2023 Initial Diagnosis   Vulvar cancer (HCC)   06/28/2023 Cancer Staging   Staging form: Vulva, AJCC V9 - Clinical stage from 06/28/2023: FIGO Stage IIIC (cT1b, cN1c, cM0) - Signed by Cornelius Wanda DEL, MD on 07/31/2023 Histopathologic type: Squamous cell carcinoma, NOS Stage prefix: Initial diagnosis Method of lymph node assessment: Lymph node dissection Histologic grade (G): G2 Histologic grading system: 3 grade system Tumor size (mm): 30 Lymph-vascular invasion (LVI): LVI present/identified, NOS Diagnostic confirmation: Positive histology Specimen type: Excision Staged by: Managing physician Femoral-inguinal nodal status: Positive Solitary (s) or multifocal (m) tumors in the primary site: Solitary Perineural invasion (PNI): Unknown Stage used in treatment planning: Yes National guidelines used in treatment planning: Yes Type of national guideline used in treatment planning: NCCN   09/04/2023 - 09/18/2023 Chemotherapy   Patient is on Treatment Plan : CERVICAL Pembrolizumab  q21d + XRT (cisplatin  d/c'd 09/25/23)     10/23/2023 -  Chemotherapy   Patient is on Treatment Plan : CERVICAL Pembrolizumab  (200) q21d         INTERVAL HISTORY:   Gionni is here today for repeat clinical assessment. She reports continued fatigue and occasional headaches.  She denies fevers or chills. Her appetite is decreased, mainly due to taste changes.  She states she is drinking about 4 small bottles of water or liquid IV a day.  Her weight has decreased 5 pounds over last week. She reports  severe vaginal itching last night. She denies vaginal discharge. She reports occasional blood on the tissue after urination and continued bladder leakage. She denies dysuria, frequency of urination or cloudy, malodorous urine. She reports pain with bowel movements and questions narrowing of the canal. Stools are of normal consistency and caliber.  REVIEW OF SYSTEMS:   Review of Systems  Constitutional:  Positive for appetite change (decreased) and fatigue. Negative for chills, fever and unexpected weight change.  HENT:   Negative for lump/mass, mouth sores and sore throat.   Respiratory:  Negative for cough and shortness of breath.   Cardiovascular:  Negative for chest pain and leg swelling.  Gastrointestinal:  Negative for abdominal pain, constipation, diarrhea, nausea and vomiting.  Genitourinary:  Positive for bladder incontinence. Negative for difficulty urinating, dysuria, frequency, hematuria, pelvic pain, vaginal bleeding and vaginal discharge.   Musculoskeletal:  Negative for arthralgias, back pain, gait problem and myalgias.  Skin:  Negative for rash.  Neurological:  Positive for headaches (intermittent). Negative for dizziness, extremity weakness, gait problem, light-headedness and numbness.  Hematological:  Negative for adenopathy. Does not bruise/bleed easily.  Psychiatric/Behavioral:  Negative for depression and sleep disturbance. The patient is not nervous/anxious.      VITALS:   Blood pressure (!) 140/69, pulse 60, temperature 98 F (36.7 C), temperature source Oral, resp. rate 18, height 5' 3 (1.6 m), weight 206 lb 11.2 oz (93.8 kg), SpO2 100%.  Wt Readings from Last 3 Encounters:  10/18/23 206 lb 11.2 oz (93.8 kg)  10/11/23 211 lb 6.4 oz (95.9 kg)  10/04/23 211 lb (95.7 kg)    Body mass index is 36.62 kg/m.  Performance status (ECOG): 2 - Symptomatic, <50% confined to bed    PHYSICAL EXAM:   Physical Exam Vitals and nursing note reviewed.  Constitutional:       General: She is not in acute distress.    Appearance: Normal appearance.  HENT:     Head: Normocephalic and atraumatic.     Mouth/Throat:     Mouth: Mucous membranes are moist.     Pharynx: Oropharynx is clear. No oropharyngeal exudate or posterior oropharyngeal erythema.  Eyes:     General: No scleral icterus.    Extraocular Movements: Extraocular movements intact.     Conjunctiva/sclera: Conjunctivae normal.     Pupils: Pupils are equal, round, and reactive to light.  Cardiovascular:     Rate and Rhythm: Normal rate and regular rhythm.     Heart sounds: Normal heart sounds. No murmur heard.    No friction rub. No gallop.  Pulmonary:     Effort: Pulmonary effort is normal.     Breath sounds: Normal breath sounds. No wheezing, rhonchi or rales.  Abdominal:     General: There is no distension.     Palpations: Abdomen is soft. There is no hepatomegaly, splenomegaly or mass.     Tenderness: There is no abdominal tenderness.  Genitourinary:    Labia:        Right: No rash, tenderness or lesion.        Left: No rash, tenderness or lesion.      Rectum: Tenderness present. No mass. Abnormal anal tone.     Comments: Mild increase anal tone and tenderness,  no mass, no stool in vault. Musculoskeletal:        General: Normal range of motion.     Cervical back: Normal range of motion and neck supple. No tenderness.     Right lower leg: No edema.     Left lower leg: No edema.  Lymphadenopathy:     Cervical: No cervical adenopathy.     Upper Body:     Right upper body: No supraclavicular or axillary adenopathy.     Left upper body: No supraclavicular or axillary adenopathy.     Lower Body: No right inguinal adenopathy. No left inguinal adenopathy.  Skin:    General: Skin is warm and dry.     Coloration: Skin is not jaundiced.     Findings: No rash.     Comments: Minimal erythema of the lower abdomen, groins  Neurological:     Mental Status: She is alert and oriented to person, place,  and time.     Cranial Nerves: No cranial nerve deficit.  Psychiatric:        Mood and Affect: Mood normal.        Behavior: Behavior normal.        Thought Content: Thought content normal.     LABS:      Latest Ref Rng & Units 10/18/2023   10:52 AM 10/11/2023   12:45 PM 10/04/2023    8:50 AM  CBC  WBC 4.0 - 10.5 K/uL 3.9  3.6  2.7   Hemoglobin 12.0 - 15.0 g/dL 9.3  8.7  8.5   Hematocrit 36.0 - 46.0 % 28.2  27.0  25.9   Platelets 150 - 400 K/uL 221  299  169       Latest Ref Rng & Units 10/18/2023   10:52 AM 10/11/2023   12:45 PM 10/04/2023    8:50 AM  CMP  Glucose 70 - 99 mg/dL 898  895  892   BUN 8 - 23 mg/dL 25  22  25    Creatinine 0.44 - 1.00 mg/dL 8.93  8.86  8.77   Sodium 135 - 145 mmol/L 140  141  139   Potassium 3.5 - 5.1 mmol/L 4.3  4.0  3.1   Chloride 98 - 111 mmol/L 104  103  103   CO2 22 - 32 mmol/L 24  25  24    Calcium  8.9 - 10.3 mg/dL 9.6  9.4  9.3   Total Protein 6.5 - 8.1 g/dL 6.4  6.4  6.2   Total Bilirubin 0.0 - 1.2 mg/dL 0.3  0.2  0.3   Alkaline Phos 38 - 126 U/L 83  92  80   AST 15 - 41 U/L 18  24  24    ALT 0 - 44 U/L 17  30  26       Lab Results  Component Value Date   TIBC 441 10/11/2023   FERRITIN 65 10/11/2023   IRONPCTSAT 14 10/11/2023     STUDIES:   No results found.    HISTORY:   Past Medical History:  Diagnosis Date   Anemia    Anxiety    Cancer (HCC)    right breast ILC   Complication of anesthesia    difficulty waking up   Depression    Diverticulitis    Family history of breast cancer    Family history of ovarian cancer    Family history of prostate cancer    Fibromyalgia    GERD (gastroesophageal reflux disease)    Heart  murmur    History of colonic polyps    History of hiatal hernia    Hyperlipidemia    Hypertension    IBS (irritable bowel syndrome)    Ischemic colitis (HCC) 03/2012   Osteoporosis    Pre-diabetes    RLS (restless legs syndrome)    Sinusitis    Sleep apnea    CPAP nightly    Past Surgical  History:  Procedure Laterality Date   BREAST LUMPECTOMY WITH RADIOACTIVE SEED LOCALIZATION Right 08/04/2021   Procedure: RIGHT BREAST LUMPECTOMY WITH RADIOACTIVE SEED LOCALIZATION;  Surgeon: Vanderbilt Ned, MD;  Location: [facility];  Service: General;  Laterality: Right;   CHOLECYSTECTOMY  2004   COLONOSCOPY WITH PROPOFOL  N/A 04/18/2017   Procedure: COLONOSCOPY WITH PROPOFOL ;  Surgeon: Kristie Lamprey, MD;  Location: [facility];  Service: Endoscopy;  Laterality: N/A;   ESOPHAGOGASTRODUODENOSCOPY (EGD) WITH PROPOFOL  N/A 04/18/2017   Procedure: ESOPHAGOGASTRODUODENOSCOPY (EGD) WITH PROPOFOL ;  Surgeon: Kristie Lamprey, MD;  Location: [facility];  Service: Endoscopy;  Laterality: N/A;   EXAM UNDER ANESTHESIA, PELVIC N/A 06/28/2023   Procedure: EXAM UNDER ANESTHESIA, PELVIC;  Surgeon: Viktoria Comer SAUNDERS, MD;  Location: [facility];  Service: Gynecology;  Laterality: N/A;   FLEXIBLE SIGMOIDOSCOPY  04/20/2012   Procedure: FLEXIBLE SIGMOIDOSCOPY;  Surgeon: Belvie JONETTA Just, MD;  Location: [facility];  Service: Endoscopy;  Laterality: N/A;   INGUINAL LYMPHADENECTOMY N/A 06/28/2023   Procedure: LYMPHADENECTOMY, INGUINAL, OPEN;  Surgeon: Viktoria Comer SAUNDERS, MD;  Location: [facility];  Service: Gynecology;  Laterality: N/A;   IR IMAGING GUIDED PORT INSERTION  07/31/2023   NISSEN FUNDOPLICATION  2011   PARAESOPHAGEAL HERNIA REPAIR  09/11/2009   and Nissen fundoplication   RADICAL VULVECTOMY N/A 06/28/2023   Procedure: VULVECTOMY, RADICAL;  Surgeon: Viktoria Comer SAUNDERS, MD;  Location: [facility];  Service: Gynecology;  Laterality: N/A;  Possible skin flap   RE-EXCISION OF BREAST LUMPECTOMY Right 08/18/2021   Procedure: RE-EXCISION RIGHT BREAST LUMPECTOMY;  Surgeon: Vanderbilt Ned, MD;  Location: [facility];  Service: General;  Laterality: Right;   SENTINEL NODE BIOPSY N/A 08/04/2021   Procedure: SENTINEL NODE BIOPSY;  Surgeon: Vanderbilt Ned, MD;  Location: [facility];  Service: General;  Laterality: N/A;    TONSILLECTOMY  1960's   TOTAL ABDOMINAL HYSTERECTOMY  2001   w/ BSO    Family History  Problem Relation Age of Onset   Colon cancer Mother 89   Alzheimer's disease Mother    Heart attack Father    Cancer Maternal Aunt        x2   Alzheimer's disease Maternal Aunt    Ovarian cancer Maternal Aunt    Prostate cancer Maternal Uncle    Kidney disease Maternal Uncle    Atrial fibrillation Maternal Uncle    Stroke Paternal Uncle    Aneurysm Paternal Grandmother        brain   Stroke Paternal Grandfather    Prostate cancer Cousin        paternal first cousin   Prostate cancer Cousin        paternal first cousin   Esophageal cancer Cousin        maternal first cousin   Breast cancer Cousin        DCIS, maternal first cousin   Ovarian cancer Cousin        maternal first cousin   Rheum arthritis Other    Lung disease Neg Hx     Social History:  reports that she has never smoked. She has  never used smokeless tobacco. She reports that she does not drink alcohol and does not use drugs.The patient is alone today.  Allergies:  Allergies  Allergen Reactions   Ceftin [Cefuroxime Axetil] Diarrhea   Cefuroxime Other (See Comments)    IBS   Compazine  [Prochlorperazine ] Other (See Comments)    Vision problems   Other Other (See Comments)    Pneumonia vaccine (uncoded)   Sulfa  Antibiotics Nausea And Vomiting   Tramadol Hcl Nausea And Vomiting   Zofran  [Ondansetron ] Other (See Comments)    Vision changes    Current Medications: Current Outpatient Medications  Medication Sig Dispense Refill   acetaminophen  (TYLENOL ) 650 MG CR tablet 1,300 mg in the morning and at bedtime.     amLODipine  (NORVASC ) 5 MG tablet Take 5 mg by mouth daily.     amoxicillin  (AMOXIL ) 875 MG tablet Take 1 tablet (875 mg total) by mouth 2 (two) times daily. 20 tablet 0   Ascorbic Acid (VITAMIN C) 500 MG CHEW Chew 500 mg by mouth daily.     aspirin EC 81 MG tablet Take 81 mg by mouth at bedtime. Swallow whole.      atenolol  (TENORMIN ) 50 MG tablet Take 75 mg by mouth daily.      benazepril  (LOTENSIN ) 10 MG tablet Take 10 mg by mouth daily.     benzonatate (TESSALON) 100 MG capsule Take 100-200 mg by mouth at bedtime.     Biotin 1000 MCG tablet Take 1,000 mcg by mouth daily.     Calcium  Citrate-Vitamin D (CITRACAL + D PO) Take 1 tablet by mouth daily.     cetirizine (ZYRTEC) 10 MG tablet Take 10 mg by mouth at bedtime.     Cholecalciferol (VITAMIN D) 50 MCG (2000 UT) tablet Take 2,000 Units by mouth daily.     cyanocobalamin 1000 MCG tablet Take 1,000 mcg by mouth daily.     diclofenac sodium (VOLTAREN) 1 % GEL Apply 1 application  topically daily as needed (for pain).     dicyclomine (BENTYL) 10 MG capsule Take 1 capsule by mouth as needed.     diphenhydrAMINE  (BENADRYL ) 25 MG tablet Take 25 mg by mouth every 6 (six) hours as needed for allergies.     diphenoxylate-atropine (LOMOTIL) 2.5-0.025 MG tablet Take 2 tablets by mouth 4 (four) times daily as needed for diarrhea or loose stools.     doxycycline (MONODOX) 100 MG capsule Take 100 mg by mouth 2 (two) times daily. (Patient not taking: Reported on 10/18/2023)     DULoxetine  (CYMBALTA ) 60 MG capsule Take 120 mg by mouth at bedtime.     EPINEPHrine  (EPIPEN  2-PAK) 0.3 mg/0.3 mL IJ SOAJ injection Inject 0.3 mg into the muscle as needed for anaphylaxis. (Patient not taking: Reported on 09/11/2023)     famotidine  (PEPCID ) 20 MG tablet Take 20 mg by mouth at bedtime.     fexofenadine (ALLEGRA) 180 MG tablet Take 180 mg by mouth in the morning.     fluconazole  (DIFLUCAN ) 100 MG tablet Take 1 tablet (100 mg total) by mouth daily. 10 tablet 5   fluticasone  (FLONASE ) 50 MCG/ACT nasal spray Place 1 spray into both nostrils daily.     lidocaine  (XYLOCAINE ) 5 % ointment Apply 1 Application topically 2 (two) times daily as needed for moderate pain (pain score 4-6) (to the vulva). Apply to vulva as needed for discomfort 35.44 g 3   lidocaine  4 % Place 1 patch onto  the skin daily.     LORazepam  (ATIVAN )  1 MG tablet Take 1 tablet (1 mg total) by mouth every 8 (eight) hours. (Patient not taking: Reported on 10/18/2023) 30 tablet 0   NON FORMULARY Pt uses a c-pap nightly     Nutritional Supplements (JUICE PLUS FIBRE PO) Take 2 each by mouth daily. Fruits and Vegetables     nystatin  (MYCOSTATIN ) 100000 UNIT/ML suspension Take 5 mLs (500,000 Units total) by mouth 4 (four) times daily. 60 mL 2   nystatin  (MYCOSTATIN /NYSTOP ) powder Apply 1 Application topically 3 (three) times daily. 30 g 5   Omega-3 Fatty Acids (FISH OIL) 1000 MG CAPS Take 1,000 mg by mouth daily.      pantoprazole  (PROTONIX ) 40 MG tablet Take 40 mg by mouth every evening.     Phenylephrine -APAP-Guaifenesin (TYLENOL  SINUS SEVERE PO) Take 2 tablets by mouth daily as needed (drainage).     polyvinyl alcohol (LIQUIFILM TEARS) 1.4 % ophthalmic solution Place 1 drop into both eyes 5 (five) times daily.     Probiotic Product (PROBIOTIC & ACIDOPHILUS EX ST PO) Take 1 capsule by mouth daily. Ultra Flora Plus Capsules     Propylene Glycol (SYSTANE COMPLETE) 0.6 % SOLN Place 1 drop into both eyes in the morning and at bedtime.     rOPINIRole  (REQUIP ) 0.5 MG tablet Take 0.5 mg by mouth at bedtime.     senna-docusate (SENOKOT-S) 8.6-50 MG tablet Take 2 tablets by mouth at bedtime. For AFTER surgery, do not take if having diarrhea 30 tablet 0   simvastatin  (ZOCOR ) 10 MG tablet Take 10 mg by mouth at bedtime.       sucralfate  (CARAFATE ) 1 GM/10ML suspension Take 1 g by mouth 2 (two) times daily.     traZODone  (DESYREL ) 50 MG tablet Take 50 mg by mouth at bedtime.     triamcinolone  (KENALOG ) 0.025 % ointment Apply 1 Application topically 2 (two) times daily. 30 g 0   valACYclovir (VALTREX) 1000 MG tablet Take 1,000 mg by mouth 3 (three) times daily.     vitamin E 400 UNIT capsule Take 400 Units by mouth daily.       White Petrolatum -Mineral Oil (REFRESH P.M. OP) Place 1 Application into both eyes at bedtime.      No current facility-administered medications for this visit.   Facility-Administered Medications Ordered in Other Visits  Medication Dose Route Frequency Provider Last Rate Last Admin   0.9 %  sodium chloride  infusion   Intravenous Continuous [place] A, PA-C   Stopped at 10/18/23 1414

## 2023-10-18 NOTE — Assessment & Plan Note (Signed)
 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  She has had previous iron deficiency.  Repeat iron studies did not reveal recurrent iron deficiency.  B12 and folate were normal. Her hemoglobin is slightly better today.

## 2023-10-18 NOTE — Assessment & Plan Note (Signed)
 She is clinically dehydrated, so I will give her IV fluids again today. I encouraged her to continue to increase her clear liquid intake.

## 2023-10-18 NOTE — Assessment & Plan Note (Addendum)
 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  She was treated with pelvic vulvectomy, bilateral debulking, radical lymphadenectomy, and inguinal dissection in April 2025.  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  There was lymphovascular invasion and carcinoma in situ at the 6 o'clock margin with < 1 mm margin for invasive carcinoma. PET scan revealed metastatic left inguinal lymph nodes up to 1.8 cm in diameter, but no additional evidence of metastatic disease in the neck, chest, abdomen or pelvis. There was a 4 mm right lower lobe nodule, too small for PET resolution.   She was receiving concurrent chemoradiation with weekly cisplatin , as well as pembrolizuab every 3 weeks since her PDL 1 score is 85%, followed by maintenance pembrolizumab .  She had difficulty tolerating treatment due to nausea, vomiting and diarrhea, as well as hypomagnesemia.  We gave her additional IV fluids weekly and IV magnesium  as needed.  She developed mild pancytopenia felt to be most likely due to treatment.  After her third cycle of weekly cisplatin , she was hospitalized with sepsis due to recurrent cellulitis of the left vulva and thigh.  She then developed shingles of the left thigh.  She had pancytopenia at least in part due to chemotherapy.  At discharge on July 4, WBCs were 2.5, hemoglobin 8.7 and platelets 142,000.  She had hypokalemia and hypomagnesemia resolved with IV Supple Tatian.  She was discharged on doxycycline and valacyclovir.  Due to the multiple issues due to chemotherapy, we recommended stopping cisplatin  and transitioning to maintenance pembrolizumab  once radiation is complete.  The patient delayed her radiation therapy again until next week.  At this point, she states she does not desire any further radiation or immunotherapy,  as she does not feel she can tolerate it. She remains without evidence of recurrence. There is no obvious cause of the pruritis.  Still we are concerned about the risk of recurrence without continued treatment. I will discuss with Dr. Cornelius. We will plan to see her weekly with a CBC, comprehensive metabolic panel and magnesium  for continued supportive care.

## 2023-10-19 ENCOUNTER — Ambulatory Visit

## 2023-10-20 ENCOUNTER — Ambulatory Visit

## 2023-10-20 DIAGNOSIS — I10 Essential (primary) hypertension: Secondary | ICD-10-CM | POA: Diagnosis not present

## 2023-10-20 DIAGNOSIS — D630 Anemia in neoplastic disease: Secondary | ICD-10-CM | POA: Diagnosis not present

## 2023-10-20 DIAGNOSIS — A419 Sepsis, unspecified organism: Secondary | ICD-10-CM | POA: Diagnosis not present

## 2023-10-20 DIAGNOSIS — L03315 Cellulitis of perineum: Secondary | ICD-10-CM | POA: Diagnosis not present

## 2023-10-20 DIAGNOSIS — C519 Malignant neoplasm of vulva, unspecified: Secondary | ICD-10-CM | POA: Diagnosis not present

## 2023-10-20 DIAGNOSIS — B029 Zoster without complications: Secondary | ICD-10-CM | POA: Diagnosis not present

## 2023-10-23 ENCOUNTER — Encounter: Payer: Self-pay | Admitting: Oncology

## 2023-10-23 ENCOUNTER — Ambulatory Visit

## 2023-10-24 ENCOUNTER — Ambulatory Visit

## 2023-10-24 DIAGNOSIS — I10 Essential (primary) hypertension: Secondary | ICD-10-CM | POA: Diagnosis not present

## 2023-10-24 DIAGNOSIS — B029 Zoster without complications: Secondary | ICD-10-CM | POA: Diagnosis not present

## 2023-10-24 DIAGNOSIS — L03315 Cellulitis of perineum: Secondary | ICD-10-CM | POA: Diagnosis not present

## 2023-10-24 DIAGNOSIS — C519 Malignant neoplasm of vulva, unspecified: Secondary | ICD-10-CM | POA: Diagnosis not present

## 2023-10-24 DIAGNOSIS — A419 Sepsis, unspecified organism: Secondary | ICD-10-CM | POA: Diagnosis not present

## 2023-10-24 DIAGNOSIS — D630 Anemia in neoplastic disease: Secondary | ICD-10-CM | POA: Diagnosis not present

## 2023-10-25 ENCOUNTER — Inpatient Hospital Stay

## 2023-10-25 ENCOUNTER — Ambulatory Visit

## 2023-10-25 ENCOUNTER — Inpatient Hospital Stay (HOSPITAL_BASED_OUTPATIENT_CLINIC_OR_DEPARTMENT_OTHER): Admitting: Hematology and Oncology

## 2023-10-25 ENCOUNTER — Encounter: Payer: Self-pay | Admitting: Hematology and Oncology

## 2023-10-25 ENCOUNTER — Other Ambulatory Visit: Payer: Self-pay | Admitting: Pharmacist

## 2023-10-25 VITALS — BP 138/67 | HR 77 | Temp 99.7°F | Resp 20 | Ht 63.0 in | Wt 208.4 lb

## 2023-10-25 DIAGNOSIS — T451X5A Adverse effect of antineoplastic and immunosuppressive drugs, initial encounter: Secondary | ICD-10-CM

## 2023-10-25 DIAGNOSIS — E785 Hyperlipidemia, unspecified: Secondary | ICD-10-CM | POA: Diagnosis not present

## 2023-10-25 DIAGNOSIS — E860 Dehydration: Secondary | ICD-10-CM

## 2023-10-25 DIAGNOSIS — C519 Malignant neoplasm of vulva, unspecified: Secondary | ICD-10-CM

## 2023-10-25 DIAGNOSIS — Z170 Estrogen receptor positive status [ER+]: Secondary | ICD-10-CM | POA: Diagnosis not present

## 2023-10-25 DIAGNOSIS — L03311 Cellulitis of abdominal wall: Secondary | ICD-10-CM | POA: Diagnosis not present

## 2023-10-25 DIAGNOSIS — D6481 Anemia due to antineoplastic chemotherapy: Secondary | ICD-10-CM

## 2023-10-25 DIAGNOSIS — C50812 Malignant neoplasm of overlapping sites of left female breast: Secondary | ICD-10-CM | POA: Diagnosis not present

## 2023-10-25 LAB — CMP (CANCER CENTER ONLY)
ALT: 19 U/L (ref 0–44)
AST: 18 U/L (ref 15–41)
Albumin: 4 g/dL (ref 3.5–5.0)
Alkaline Phosphatase: 85 U/L (ref 38–126)
Anion gap: 13 (ref 5–15)
BUN: 21 mg/dL (ref 8–23)
CO2: 22 mmol/L (ref 22–32)
Calcium: 9.3 mg/dL (ref 8.9–10.3)
Chloride: 99 mmol/L (ref 98–111)
Creatinine: 1.02 mg/dL — ABNORMAL HIGH (ref 0.44–1.00)
GFR, Estimated: 58 mL/min — ABNORMAL LOW (ref >60)
Glucose, Bld: 130 mg/dL — ABNORMAL HIGH (ref 70–99)
Potassium: 4 mmol/L (ref 3.5–5.1)
Sodium: 134 mmol/L — ABNORMAL LOW (ref 135–145)
Total Bilirubin: 0.4 mg/dL (ref 0.0–1.2)
Total Protein: 6.4 g/dL — ABNORMAL LOW (ref 6.5–8.1)

## 2023-10-25 LAB — CBC WITH DIFFERENTIAL (CANCER CENTER ONLY)
Abs Immature Granulocytes: 0.05 K/uL (ref 0.00–0.07)
Basophils Absolute: 0 K/uL (ref 0.0–0.1)
Basophils Relative: 0 %
Eosinophils Absolute: 0 K/uL (ref 0.0–0.5)
Eosinophils Relative: 0 %
HCT: 29.1 % — ABNORMAL LOW (ref 36.0–46.0)
Hemoglobin: 9.7 g/dL — ABNORMAL LOW (ref 12.0–15.0)
Immature Granulocytes: 1 %
Lymphocytes Relative: 3 %
Lymphs Abs: 0.3 K/uL — ABNORMAL LOW (ref 0.7–4.0)
MCH: 29 pg (ref 26.0–34.0)
MCHC: 33.3 g/dL (ref 30.0–36.0)
MCV: 87.1 fL (ref 80.0–100.0)
Monocytes Absolute: 0.6 K/uL (ref 0.1–1.0)
Monocytes Relative: 8 %
Neutro Abs: 7.3 K/uL (ref 1.7–7.7)
Neutrophils Relative %: 88 %
Platelet Count: 159 K/uL (ref 150–400)
RBC: 3.34 MIL/uL — ABNORMAL LOW (ref 3.87–5.11)
RDW: 18.2 % — ABNORMAL HIGH (ref 11.5–15.5)
WBC Count: 8.3 K/uL (ref 4.0–10.5)
nRBC: 0 % (ref 0.0–0.2)

## 2023-10-25 LAB — TSH: TSH: 0.815 u[IU]/mL (ref 0.350–4.500)

## 2023-10-25 LAB — MAGNESIUM: Magnesium: 1.5 mg/dL — ABNORMAL LOW (ref 1.7–2.4)

## 2023-10-25 MED ORDER — HEPARIN SOD (PORK) LOCK FLUSH 100 UNIT/ML IV SOLN
500.0000 [IU] | Freq: Once | INTRAVENOUS | Status: AC | PRN
  Administered 2023-10-25: 500 [IU]

## 2023-10-25 MED ORDER — SODIUM CHLORIDE 0.9 % IV SOLN
Freq: Once | INTRAVENOUS | Status: AC
Start: 2023-10-25 — End: 2023-10-25

## 2023-10-25 MED ORDER — SODIUM CHLORIDE 0.9% FLUSH
10.0000 mL | Freq: Once | INTRAVENOUS | Status: AC | PRN
Start: 2023-10-25 — End: 2023-10-25
  Administered 2023-10-25: 10 mL

## 2023-10-25 MED ORDER — SODIUM CHLORIDE 0.9 % IV SOLN
2.0000 g | Freq: Once | INTRAVENOUS | Status: AC
  Administered 2023-10-25: 2 g via INTRAVENOUS
  Filled 2023-10-25: qty 20

## 2023-10-25 MED ORDER — OXYCODONE HCL 5 MG PO TABS
5.0000 mg | ORAL_TABLET | Freq: Once | ORAL | Status: AC
  Administered 2023-10-25: 5 mg via ORAL
  Filled 2023-10-25: qty 1

## 2023-10-25 MED ORDER — ACETAMINOPHEN 325 MG PO TABS
650.0000 mg | ORAL_TABLET | Freq: Four times a day (QID) | ORAL | Status: DC | PRN
  Administered 2023-10-25: 650 mg via ORAL
  Filled 2023-10-25: qty 2

## 2023-10-25 MED FILL — Ceftriaxone Sodium For Inj 2 GM: INTRAMUSCULAR | Qty: 20 | Status: AC

## 2023-10-25 NOTE — Progress Notes (Signed)
 [facility]  [address] 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  She was treated with pelvic vulvectomy, bilateral debulking, radical lymphadenectomy, and inguinal dissection in April 2025.  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  There was lymphovascular invasion and carcinoma in situ at the 6 o'clock margin with < 1 mm margin for invasive carcinoma. PET scan revealed metastatic left inguinal lymph nodes up to 1.8 cm in diameter, but no additional evidence of metastatic disease in the neck, chest, abdomen or pelvis. There was a 4 mm right lower lobe nodule, too small for PET resolution.   She was receiving concurrent chemoradiation with weekly cisplatin , as well as pembrolizuab every 3 weeks since her PDL 1 score is 85%, followed by maintenance pembrolizumab .  She had difficulty tolerating treatment due to nausea, vomiting and diarrhea, as well as hypomagnesemia.  We gave her additional IV fluids weekly and IV magnesium  as needed.  She developed mild pancytopenia felt to be most likely due to treatment.  After her third cycle of weekly cisplatin , she was hospitalized with sepsis due to recurrent cellulitis of the left vulva and thigh.  She then developed shingles of the left thigh.  She had pancytopenia at least in part due to chemotherapy.  At discharge on July 4, WBCs were 2.5, hemoglobin 8.7 and platelets 142,000.  She had hypokalemia and hypomagnesemia resolved with IV Supple Tatian.  She was discharged on doxycycline and valacyclovir.  Due to the multiple issues due to chemotherapy, we recommended stopping  cisplatin  and transitioning to maintenance pembrolizumab  once radiation is complete.  The patient has declined any further radiation or immunotherapy.  She remains without evidence of recurrence.  We are very concerned about the risk of recurrence without continued treatment.  We will continue to see her weekly with a CBC, comprehensive metabolic panel and magnesium  for continued supportive care.   Cellulitis Recurrent cellulitis of the lower abdomen. Despite the fact that she has nausea and diarrhea associated with cefuroxime, we will give her IV ceftriaxone  daily for 3 days and if improved transition her to oral cephalexin .  Dehydration She is clinically dehydrated, so I will give her IV fluids again today. I encouraged her to continue to increase her clear liquid intake.  Hypomagnesemia Mild recurrent hypomagnesemia. She will receive IV magnesium  this week. We will continue to check magnesium  weekly for now.  Anemia due to antineoplastic chemotherapy Anemia most likely due to chemotherapy.  She has had previous iron deficiency.  Repeat iron studies did not reveal recurrent iron deficiency.  B12 and folate were normal. Her hemoglobin is stable.    The patient understands the plans discussed today and is in agreement with them.  She knows to contact our office if she develops concerns prior to her next appointment.   I provided 30 minutes of face-to-face time during this encounter and > 50% was spent counseling as documented under my assessment and plan.    Eragon Hammond A Mithcell Schumpert, PA-C  [facility] [place] - A DEPT OF [facility]. [facility] [address] [place] [address] Dept: [phone]  Dept Fax: [phone]   No orders of the defined types were placed in this encounter.     CHIEF COMPLAINT:  CC: Vulvar cancer  Current Treatment: Chemoradiation discontinued per patient  HISTORY OF PRESENT ILLNESS:   Oncology History  Cancer of central  portion of right breast (HCC)  06/08/2021 Initial Diagnosis   Breast cancer in female [facility])   07/23/2021 Genetic Testing   Negative hereditary cancer genetic testing: no pathogenic variants detected in [facility] BRCAPlus Panel. Report date is July 23, 2021.  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  The report date is Jul 26, 2021.  The BRCAplus panel offered by [employer] and includes sequencing and deletion/duplication analysis for the following 8 genes: ATM, BRCA1, BRCA2, CDH1, CHEK2, PALB2, PTEN, and TP53.  Results of pan-cancer panel pending.   The CancerNext-Expanded gene panel offered by [facility] and includes sequencing and rearrangement analysis for the following 77 genes: AIP, ALK, APC*, ATM*, AXIN2, BAP1, BARD1, BLM, BMPR1A, BRCA1*, BRCA2*, BRIP1*, CDC73, CDH1*, CDK4, CDKN1B, CDKN2A, CHEK2*, CTNNA1, DICER1, FANCC, FH, FLCN, GALNT12, KIF1B, LZTR1, MAX, MEN1, MET, MLH1*, MSH2*, MSH3, MSH6*, MUTYH*, NBN, NF1*, NF2, NTHL1, PALB2*, PHOX2B, PMS2*, POT1, PRKAR1A, PTCH1, PTEN*, RAD51C*, RAD51D*, RB1, RECQL, RET, SDHA, SDHAF2, SDHB, SDHC, SDHD, SMAD4, SMARCA4, SMARCB1, SMARCE1, STK11, SUFU, TMEM127, TP53*, TSC1, TSC2, VHL and XRCC2 (sequencing and deletion/duplication); EGFR, EGLN1, HOXB13, KIT, MITF, PDGFRA, POLD1, and POLE (sequencing only); EPCAM and GREM1 (deletion/duplication only). DNA and RNA analyses performed for * genes.    08/23/2021 Cancer Staging   Staging form: Breast, AJCC 8th Edition - Pathologic stage from 08/23/2021: Stage IA (pT2(2), pN0(sn), cM0, G1, ER+, PR+, HER2-) - Signed by Cornelius Wanda DEL, MD on 09/29/2021 Histopathologic type: Lobular carcinoma, NOS Stage prefix: Initial diagnosis Method of lymph node assessment: Sentinel lymph node biopsy Nuclear grade: G1 Multigene prognostic tests performed: EndoPredict Histologic grading  system: 3 grade system Residual tumor (R): R0 - None Laterality: Right Tumor size (mm): 24 Multiple tumors: Yes Number of tumors: 2 Lymph-vascular invasion (LVI): LVI not present (absent)/not identified Diagnostic confirmation: Positive histology PLUS positive immunophenotyping and/or positive genetic studies Specimen type: Excision Staged by: Managing physician Menopausal status: Postmenopausal Ki-67 (%): 5 Stage used in treatment planning: Yes National guidelines used in treatment planning: Yes Type of national guideline used in treatment planning: NCCN   Vulvar cancer (HCC)  06/28/2023 Initial Diagnosis   Vulvar cancer (HCC)   06/28/2023 Cancer Staging   Staging form: Vulva, AJCC V9 - Clinical stage from 06/28/2023: FIGO Stage IIIC (cT1b, cN1c, cM0) - Signed by Cornelius Wanda DEL, MD on 07/31/2023 Histopathologic type: Squamous cell carcinoma, NOS Stage prefix: Initial diagnosis Method of lymph node assessment: Lymph node dissection Histologic grade (G): G2 Histologic grading system: 3 grade system Tumor size (mm): 30 Lymph-vascular invasion (LVI): LVI present/identified, NOS Diagnostic confirmation: Positive histology Specimen type: Excision Staged by: Managing physician Femoral-inguinal nodal status: Positive Solitary (s) or multifocal (m) tumors in the primary site: Solitary Perineural invasion (PNI): Unknown Stage used in treatment planning: Yes National guidelines used in treatment planning: Yes Type of national guideline used in treatment planning: NCCN   09/04/2023 - 09/18/2023 Chemotherapy   Patient is on Treatment Plan : CERVICAL Pembrolizumab  q21d + XRT (cisplatin  d/c'd 09/25/23)     10/23/2023 -  Chemotherapy   Patient is on Treatment Plan : CERVICAL Pembrolizumab  (200) q21d  INTERVAL HISTORY:   Bernadette is here today for repeat clinical assessment.  She states she has noted increased redness and warmth of the lower abdomen.  She had an episode of vomiting  this morning.  She has a severe headache this morning.  She reports fairly frequent headaches.  She reports intermittent lightheadedness.  She denies other neurologic symptoms.  She reports continued shortness of breath with exertion.  She reports chills without fever last night. She denies pain. Her appetite is good. Her weight has decreased 2 pounds over last week.   REVIEW OF SYSTEMS:   Review of Systems  Constitutional:  Positive for appetite change, chills and fatigue. Negative for diaphoresis, fever and unexpected weight change.  HENT:   Negative for lump/mass, mouth sores and sore throat.   Respiratory:  Positive for shortness of breath. Negative for cough.   Cardiovascular:  Negative for chest pain and leg swelling.  Gastrointestinal:  Positive for nausea and vomiting. Negative for abdominal pain, blood in stool, constipation and diarrhea.  Endocrine: Negative for hot flashes.  Genitourinary:  Positive for bladder incontinence. Negative for difficulty urinating, dysuria, frequency, hematuria and vaginal bleeding.   Musculoskeletal:  Negative for arthralgias, back pain, gait problem and myalgias.  Skin:  Negative for rash.  Neurological:  Positive for headaches and light-headedness. Negative for dizziness, extremity weakness and gait problem.  Hematological:  Negative for adenopathy. Does not bruise/bleed easily.  Psychiatric/Behavioral:  Negative for depression and sleep disturbance. The patient is not nervous/anxious.      VITALS:   Blood pressure 138/67, pulse 77, temperature 99.7 F (37.6 C), temperature source Oral, resp. rate 20, height 5' 3 (1.6 m), weight 208 lb 6.4 oz (94.5 kg), SpO2 100%.  Wt Readings from Last 3 Encounters:  10/25/23 208 lb 6.4 oz (94.5 kg)  10/18/23 206 lb 11.2 oz (93.8 kg)  10/11/23 211 lb 6.4 oz (95.9 kg)    Body mass index is 36.92 kg/m.  Performance status (ECOG): 1 - Symptomatic but completely ambulatory    PHYSICAL EXAM:   Physical  Exam Vitals and nursing note reviewed.  Constitutional:      General: She is not in acute distress.    Appearance: Normal appearance.  HENT:     Head: Normocephalic and atraumatic.     Mouth/Throat:     Mouth: Mucous membranes are dry.     Pharynx: Oropharynx is clear. No oropharyngeal exudate or posterior oropharyngeal erythema.  Eyes:     General: No scleral icterus.    Extraocular Movements: Extraocular movements intact.     Conjunctiva/sclera: Conjunctivae normal.     Pupils: Pupils are equal, round, and reactive to light.  Cardiovascular:     Rate and Rhythm: Normal rate and regular rhythm.     Heart sounds: Normal heart sounds. No murmur heard.    No friction rub. No gallop.  Pulmonary:     Effort: Pulmonary effort is normal.     Breath sounds: Normal breath sounds. No wheezing, rhonchi or rales.  Abdominal:     General: There is no distension.     Palpations: Abdomen is soft. There is no hepatomegaly, splenomegaly or mass.     Tenderness: There is no abdominal tenderness.  Musculoskeletal:        General: Normal range of motion.     Cervical back: Normal range of motion and neck supple. No tenderness.     Right lower leg: No edema.     Left lower leg: No edema.  Lymphadenopathy:     Cervical: No cervical adenopathy.     Upper Body:     Right upper body: No supraclavicular or axillary adenopathy.     Left upper body: No supraclavicular or axillary adenopathy.     Lower Body: No right inguinal adenopathy. No left inguinal adenopathy.  Skin:    General: Skin is warm and dry.     Coloration: Skin is not jaundiced.     Findings: Erythema present. No rash.     Comments: Large area of erythema, warmth and edema of the skin of the lower abdomen  Neurological:     Mental Status: She is alert and oriented to person, place, and time.     Cranial Nerves: No cranial nerve deficit.  Psychiatric:        Mood and Affect: Mood normal.        Behavior: Behavior normal.         Thought Content: Thought content normal.     LABS:      Latest Ref Rng & Units 10/25/2023   10:26 AM 10/18/2023   10:52 AM 10/11/2023   12:45 PM  CBC  WBC 4.0 - 10.5 K/uL 8.3  3.9  3.6   Hemoglobin 12.0 - 15.0 g/dL 9.7  9.3  8.7   Hematocrit 36.0 - 46.0 % 29.1  28.2  27.0   Platelets 150 - 400 K/uL 159  221  299       Latest Ref Rng & Units 10/25/2023   10:26 AM 10/18/2023   10:52 AM 10/11/2023   12:45 PM  CMP  Glucose 70 - 99 mg/dL 869  898  895   BUN 8 - 23 mg/dL 21  25  22    Creatinine 0.44 - 1.00 mg/dL 8.97  8.93  8.86   Sodium 135 - 145 mmol/L 134  140  141   Potassium 3.5 - 5.1 mmol/L 4.0  4.3  4.0   Chloride 98 - 111 mmol/L 99  104  103   CO2 22 - 32 mmol/L 22  24  25    Calcium  8.9 - 10.3 mg/dL 9.3  9.6  9.4   Total Protein 6.5 - 8.1 g/dL 6.4  6.4  6.4   Total Bilirubin 0.0 - 1.2 mg/dL 0.4  0.3  0.2   Alkaline Phos 38 - 126 U/L 85  83  92   AST 15 - 41 U/L 18  18  24    ALT 0 - 44 U/L 19  17  30      Lab Results  Component Value Date   TIBC 441 10/11/2023   FERRITIN 65 10/11/2023   IRONPCTSAT 14 10/11/2023    STUDIES:   No results found.    HISTORY:   Past Medical History:  Diagnosis Date  . Anemia   . Anxiety   . Cancer (HCC)    right breast ILC  . Complication of anesthesia    difficulty waking up  . Depression   . Diverticulitis   . Family history of breast cancer   . Family history of ovarian cancer   . Family history of prostate cancer   . Fibromyalgia   . GERD (gastroesophageal reflux disease)   . Heart murmur   . History of colonic polyps   . History of hiatal hernia   . Hyperlipidemia   . Hypertension   . IBS (irritable bowel syndrome)   . Ischemic colitis (HCC) 03/2012  . Osteoporosis   . Pre-diabetes   . RLS (  restless legs syndrome)   . Sinusitis   . Sleep apnea    CPAP nightly    Past Surgical History:  Procedure Laterality Date  . BREAST LUMPECTOMY WITH RADIOACTIVE SEED LOCALIZATION Right 08/04/2021   Procedure: RIGHT  BREAST LUMPECTOMY WITH RADIOACTIVE SEED LOCALIZATION;  Surgeon: Vanderbilt Ned, MD;  Location: [facility];  Service: General;  Laterality: Right;  . CHOLECYSTECTOMY  2004  . COLONOSCOPY WITH PROPOFOL  N/A 04/18/2017   Procedure: COLONOSCOPY WITH PROPOFOL ;  Surgeon: Kristie Lamprey, MD;  Location: [facility];  Service: Endoscopy;  Laterality: N/A;  . ESOPHAGOGASTRODUODENOSCOPY (EGD) WITH PROPOFOL  N/A 04/18/2017   Procedure: ESOPHAGOGASTRODUODENOSCOPY (EGD) WITH PROPOFOL ;  Surgeon: Kristie Lamprey, MD;  Location: [facility];  Service: Endoscopy;  Laterality: N/A;  . EXAM UNDER ANESTHESIA, PELVIC N/A 06/28/2023   Procedure: EXAM UNDER ANESTHESIA, PELVIC;  Surgeon: Viktoria Comer SAUNDERS, MD;  Location: [facility];  Service: Gynecology;  Laterality: N/A;  . FLEXIBLE SIGMOIDOSCOPY  04/20/2012   Procedure: FLEXIBLE SIGMOIDOSCOPY;  Surgeon: Belvie JONETTA Just, MD;  Location: [facility];  Service: Endoscopy;  Laterality: N/A;  . INGUINAL LYMPHADENECTOMY N/A 06/28/2023   Procedure: LYMPHADENECTOMY, INGUINAL, OPEN;  Surgeon: Viktoria Comer SAUNDERS, MD;  Location: [facility];  Service: Gynecology;  Laterality: N/A;  . IR IMAGING GUIDED PORT INSERTION  07/31/2023  . NISSEN FUNDOPLICATION  2011  . PARAESOPHAGEAL HERNIA REPAIR  09/11/2009   and Nissen fundoplication  . RADICAL VULVECTOMY N/A 06/28/2023   Procedure: VULVECTOMY, RADICAL;  Surgeon: Viktoria Comer SAUNDERS, MD;  Location: [facility];  Service: Gynecology;  Laterality: N/A;  Possible skin flap  . RE-EXCISION OF BREAST LUMPECTOMY Right 08/18/2021   Procedure: RE-EXCISION RIGHT BREAST LUMPECTOMY;  Surgeon: Vanderbilt Ned, MD;  Location: [facility];  Service: General;  Laterality: Right;  . SENTINEL NODE BIOPSY N/A 08/04/2021   Procedure: SENTINEL NODE BIOPSY;  Surgeon: Vanderbilt Ned, MD;  Location: [facility];  Service: General;  Laterality: N/A;  . TONSILLECTOMY  1960's  . TOTAL ABDOMINAL HYSTERECTOMY  2001   w/ BSO    Family History  Problem Relation Age of Onset   . Colon cancer Mother 38  . Alzheimer's disease Mother   . Heart attack Father   . Cancer Maternal Aunt        x2  . Alzheimer's disease Maternal Aunt   . Ovarian cancer Maternal Aunt   . Prostate cancer Maternal Uncle   . Kidney disease Maternal Uncle   . Atrial fibrillation Maternal Uncle   . Stroke Paternal Uncle   . Aneurysm Paternal Grandmother        brain  . Stroke Paternal Grandfather   . Prostate cancer Cousin        paternal first cousin  . Prostate cancer Cousin        paternal first cousin  . Esophageal cancer Cousin        maternal first cousin  . Breast cancer Cousin        DCIS, maternal first cousin  . Ovarian cancer Cousin        maternal first cousin  . Rheum arthritis Other   . Lung disease Neg Hx     Social History:  reports that she has never smoked. She has never used smokeless tobacco. She reports that she does not drink alcohol and does not use drugs.The patient is alone today.  Allergies:  Allergies  Allergen Reactions  . Cefuroxime Other (See Comments), Diarrhea and Nausea Only    IBS  . Sulfa  Antibiotics Nausea And  Vomiting and Nausea Only  . Tramadol Other (See Comments) and Nausea Only  . Ceftin [Cefuroxime Axetil] Diarrhea  . Compazine  [Prochlorperazine ] Other (See Comments)    Vision problems  . Ondansetron  Hcl   . Other Other (See Comments)    Pneumonia vaccine (uncoded)  . Tramadol Hcl Nausea And Vomiting  . Zofran  [Ondansetron ] Other (See Comments)    Vision changes    Current Medications: Current Outpatient Medications  Medication Sig Dispense Refill  . acetaminophen  (TYLENOL ) 650 MG CR tablet 650 mg every 8 (eight) hours as needed.    . Calcium  Citrate-Vitamin D3 315-6.25 MG-MCG TABS Take by mouth daily.    . acetaminophen  (TYLENOL ) 650 MG CR tablet 1,300 mg in the morning and at bedtime.    . amLODipine  (NORVASC ) 5 MG tablet Take 5 mg by mouth daily.    . amoxicillin  (AMOXIL ) 875 MG tablet Take 1 tablet (875 mg total) by  mouth 2 (two) times daily. 20 tablet 0  . Ascorbic Acid (VITAMIN C) 500 MG CHEW Chew 500 mg by mouth daily.    SABRA aspirin EC 81 MG tablet Take 81 mg by mouth at bedtime. Swallow whole.    . atenolol  (TENORMIN ) 50 MG tablet Take 75 mg by mouth daily.     . benazepril  (LOTENSIN ) 10 MG tablet Take 10 mg by mouth daily.    . benzonatate (TESSALON) 100 MG capsule Take 100-200 mg by mouth at bedtime.    . Biotin 1000 MCG tablet Take 1,000 mcg by mouth daily.    . Calcium  Citrate-Vitamin D (CITRACAL + D PO) Take 1 tablet by mouth daily.    . cetirizine (ZYRTEC) 10 MG tablet Take 10 mg by mouth at bedtime.    . Cholecalciferol (VITAMIN D) 50 MCG (2000 UT) tablet Take 2,000 Units by mouth daily.    . cyanocobalamin 1000 MCG tablet Take 1,000 mcg by mouth daily.    . diclofenac sodium (VOLTAREN) 1 % GEL Apply 1 application  topically daily as needed (for pain).    SABRA dicyclomine (BENTYL) 10 MG capsule Take 1 capsule by mouth as needed.    . diphenhydrAMINE  (BENADRYL ) 25 MG tablet Take 25 mg by mouth every 6 (six) hours as needed for allergies.    . diphenoxylate-atropine (LOMOTIL) 2.5-0.025 MG tablet Take 2 tablets by mouth 4 (four) times daily as needed for diarrhea or loose stools.    SABRA doxycycline (MONODOX) 100 MG capsule Take 100 mg by mouth 2 (two) times daily. (Patient not taking: Reported on 10/18/2023)    . DULoxetine  (CYMBALTA ) 60 MG capsule Take 120 mg by mouth at bedtime.    . DULoxetine  (CYMBALTA ) 60 MG capsule 2 capsules Orally once a day    . EPINEPHrine  (EPIPEN  2-PAK) 0.3 mg/0.3 mL IJ SOAJ injection Inject 0.3 mg into the muscle as needed for anaphylaxis. (Patient not taking: Reported on 09/11/2023)    . famotidine  (PEPCID ) 20 MG tablet Take 20 mg by mouth at bedtime.    . fexofenadine (ALLEGRA) 180 MG tablet Take 180 mg by mouth in the morning.    . fluconazole  (DIFLUCAN ) 100 MG tablet Take 1 tablet (100 mg total) by mouth daily. 10 tablet 5  . fluticasone  (FLONASE ) 50 MCG/ACT nasal spray  Place 1 spray into both nostrils daily.    . lidocaine  (XYLOCAINE ) 5 % ointment Apply 1 Application topically 2 (two) times daily as needed for moderate pain (pain score 4-6) (to the vulva). Apply to vulva as needed for discomfort 35.44  g 3  . lidocaine  4 % Place 1 patch onto the skin daily.    . LORazepam  (ATIVAN ) 1 MG tablet Take 1 tablet (1 mg total) by mouth every 8 (eight) hours. (Patient not taking: Reported on 10/18/2023) 30 tablet 0  . NON FORMULARY Pt uses a c-pap nightly    . Nutritional Supplements (JUICE PLUS FIBRE PO) Take 2 each by mouth daily. Fruits and Vegetables    . nystatin  (MYCOSTATIN ) 100000 UNIT/ML suspension Take 5 mLs (500,000 Units total) by mouth 4 (four) times daily. 60 mL 2  . nystatin  (MYCOSTATIN /NYSTOP ) powder Apply 1 Application topically 3 (three) times daily. 30 g 5  . Omega-3 Fatty Acids (FISH OIL) 1000 MG CAPS Take 1,000 mg by mouth daily.     . pantoprazole  (PROTONIX ) 40 MG tablet Take 40 mg by mouth every evening.    . Phenylephrine -APAP-Guaifenesin (TYLENOL  SINUS SEVERE PO) Take 2 tablets by mouth daily as needed (drainage).    . polyvinyl alcohol (LIQUIFILM TEARS) 1.4 % ophthalmic solution Place 1 drop into both eyes 5 (five) times daily.    . Probiotic Product (PROBIOTIC & ACIDOPHILUS EX ST PO) Take 1 capsule by mouth daily. Ultra Flora Plus Capsules    . Propylene Glycol (SYSTANE COMPLETE) 0.6 % SOLN Place 1 drop into both eyes in the morning and at bedtime.    . rOPINIRole  (REQUIP ) 0.5 MG tablet Take 0.5 mg by mouth at bedtime.    . senna (SENNA-TIME) 8.6 MG tablet Take by mouth.    . senna-docusate (SENOKOT-S) 8.6-50 MG tablet Take 2 tablets by mouth at bedtime. For AFTER surgery, do not take if having diarrhea 30 tablet 0  . simvastatin  (ZOCOR ) 10 MG tablet Take 10 mg by mouth at bedtime.      . sucralfate  (CARAFATE ) 1 GM/10ML suspension Take 1 g by mouth 2 (two) times daily.    . traZODone  (DESYREL ) 50 MG tablet Take 50 mg by mouth at bedtime.    .  triamcinolone  (KENALOG ) 0.025 % ointment Apply 1 Application topically 2 (two) times daily. 30 g 0  . valACYclovir (VALTREX) 1000 MG tablet Take 1,000 mg by mouth 3 (three) times daily.    . vitamin E 400 UNIT capsule Take 400 Units by mouth daily.      . White Petrolatum -Mineral Oil (REFRESH P.M. OP) Place 1 Application into both eyes at bedtime.     No current facility-administered medications for this visit.

## 2023-10-26 ENCOUNTER — Encounter: Payer: Self-pay | Admitting: Hematology and Oncology

## 2023-10-26 ENCOUNTER — Ambulatory Visit

## 2023-10-26 ENCOUNTER — Encounter: Payer: Self-pay | Admitting: Oncology

## 2023-10-26 VITALS — BP 136/76 | HR 68 | Temp 98.7°F | Resp 20

## 2023-10-26 DIAGNOSIS — D630 Anemia in neoplastic disease: Secondary | ICD-10-CM | POA: Diagnosis not present

## 2023-10-26 DIAGNOSIS — Z170 Estrogen receptor positive status [ER+]: Secondary | ICD-10-CM | POA: Diagnosis not present

## 2023-10-26 DIAGNOSIS — C50812 Malignant neoplasm of overlapping sites of left female breast: Secondary | ICD-10-CM | POA: Diagnosis not present

## 2023-10-26 DIAGNOSIS — E860 Dehydration: Secondary | ICD-10-CM

## 2023-10-26 DIAGNOSIS — I10 Essential (primary) hypertension: Secondary | ICD-10-CM | POA: Diagnosis not present

## 2023-10-26 DIAGNOSIS — B029 Zoster without complications: Secondary | ICD-10-CM | POA: Diagnosis not present

## 2023-10-26 DIAGNOSIS — A419 Sepsis, unspecified organism: Secondary | ICD-10-CM | POA: Diagnosis not present

## 2023-10-26 DIAGNOSIS — C519 Malignant neoplasm of vulva, unspecified: Secondary | ICD-10-CM | POA: Diagnosis not present

## 2023-10-26 DIAGNOSIS — E785 Hyperlipidemia, unspecified: Secondary | ICD-10-CM | POA: Diagnosis not present

## 2023-10-26 DIAGNOSIS — L03315 Cellulitis of perineum: Secondary | ICD-10-CM | POA: Diagnosis not present

## 2023-10-26 LAB — T4: T4, Total: 7.9 ug/dL (ref 4.5–12.0)

## 2023-10-26 MED ORDER — MAGNESIUM SULFATE 2 GM/50ML IV SOLN
2.0000 g | Freq: Once | INTRAVENOUS | Status: AC
  Administered 2023-10-26: 2 g via INTRAVENOUS
  Filled 2023-10-26: qty 50

## 2023-10-26 MED ORDER — SODIUM CHLORIDE 0.9% FLUSH
10.0000 mL | INTRAVENOUS | Status: DC | PRN
  Administered 2023-10-26: 10 mL via INTRAVENOUS

## 2023-10-26 MED ORDER — SODIUM CHLORIDE 0.9 % IV SOLN
2.0000 g | Freq: Once | INTRAVENOUS | Status: AC
  Administered 2023-10-26: 2 g via INTRAVENOUS
  Filled 2023-10-26: qty 20

## 2023-10-26 MED ORDER — SODIUM CHLORIDE 0.9 % IV SOLN: INTRAVENOUS | Status: DC

## 2023-10-26 MED FILL — Ceftriaxone Sodium For Inj 2 GM: INTRAMUSCULAR | Qty: 20 | Status: AC

## 2023-10-26 NOTE — Patient Instructions (Signed)
 Hypomagnesemia Hypomagnesemia is a condition in which the level of magnesium  in the blood is too low. Magnesium  is a mineral that is found in many foods. It is used in many different processes in the body. Hypomagnesemia can affect every organ in the body. In severe cases, it can cause life-threatening problems. What are the causes? This condition may be caused by: Not getting enough magnesium  in your diet or not having enough healthy foods to eat (malnutrition). Problems with magnesium  absorption in the intestines. Dehydration. Excessive use of alcohol. Vomiting. Severe or long-term (chronic) diarrhea. Some medicines, including medicines that make you urinate more often (diuretics). Certain diseases, such as kidney disease, diabetes, celiac disease, and overactive thyroid . What are the signs or symptoms? Symptoms of this condition include: Loss of appetite, nausea, and vomiting. Involuntary shaking or trembling of a body part (tremor). Muscle weakness or tingling in the arms and legs. Sudden tightening of muscles (muscle spasms). Confusion. Psychiatric issues, such as: Depression and irritability. Psychosis. A feeling of fluttering of the heart (palpitations). Seizures. These symptoms are more severe if magnesium  levels drop suddenly. How is this diagnosed? This condition may be diagnosed based on: Your symptoms and medical history. A physical exam. Blood and urine tests. How is this treated? Treatment depends on the cause and the severity of the condition. It may be treated by: Taking a magnesium  supplement. This can be taken in pill form. If the condition is severe, magnesium  is usually given through an IV. Making changes to your diet. You may be directed to eat foods that have a lot of magnesium , such as green leafy vegetables, peas, beans, and nuts. Not drinking alcohol. If you are struggling not to drink, ask your health care provider for help. Follow these instructions at  home: Eating and drinking     Make sure that your diet includes foods with magnesium . Foods that have a lot of magnesium  in them include: Green leafy vegetables, such as spinach and broccoli. Beans and peas. Nuts and seeds, such as almonds and sunflower seeds. Whole grains, such as whole grain bread and fortified cereals. Drink fluids that contain salts and minerals (electrolytes), such as sports drinks, when you are active. Do not drink alcohol. General instructions Take over-the-counter and prescription medicines only as told by your health care provider. Take magnesium  supplements as directed if your health care provider tells you to take them. Have your magnesium  levels monitored as told by your health care provider. Keep all follow-up visits. This is important. Contact a health care provider if: You get worse instead of better. Your symptoms return. Get help right away if: You develop severe muscle weakness. You have trouble breathing. You feel that your heart is racing. These symptoms may represent a serious problem that is an emergency. Do not wait to see if the symptoms will go away. Get medical help right away. Call your local emergency services (911 in the [place] Do not drive yourself to the hospital. Summary Hypomagnesemia is a condition in which the level of magnesium  in the blood is too low. Hypomagnesemia can affect every organ in the body. Treatment may include eating more foods that contain magnesium , taking magnesium  supplements, and not drinking alcohol. Have your magnesium  levels monitored as told by your health care provider. This information is not intended to replace advice given to you by your health care provider. Make sure you discuss any questions you have with your health care provider. Document Revised: 08/11/2020 Document Reviewed: 08/11/2020 Elsevier Patient Education  2024 [employer]Ceftriaxone  Injection What is this medication? CEFTRIAXONE  (sef  try AX one) treats infections caused by bacteria. It belongs to a group of medications called cephalosporin antibiotics. It will not treat colds, the flu, or infections caused by viruses. This medicine may be used for other purposes; ask your health care provider or pharmacist if you have questions. COMMON BRAND NAME(S): Ceftri-IM, Ceftrisol Plus, Rocephin  What should I tell my care team before I take this medication? They need to know if you have any of these conditions: Bleeding disorder High bilirubin level in newborn patients Kidney disease Liver disease Poor nutrition An unusual or allergic reaction to ceftriaxone , other penicillin or cephalosporin antibiotics, other medications, foods, dyes, or preservatives Pregnant or trying to get pregnant Breast-feeding How should I use this medication? This medication is injected into a vein or a muscle. It is usually given by your care team in a hospital or clinic setting. It may also be given at home. If you get this medication at home, you will be taught how to prepare and give it. Use exactly as directed. Take it as directed on the prescription label at the same time every day. Keep taking it even if you think you are better. It is important that you put your used needles and syringes in a special sharps container. Do not put them in a trash can. If you do not have a sharps container, call your pharmacist or care team to get one. Talk to your care team about the use of this medication in children. While it may be prescribed for children as young as newborns for selected conditions, precautions do apply. Overdosage: If you think you have taken too much of this medicine contact a poison control center or emergency room at once. NOTE: This medicine is only for you. Do not share this medicine with others. What if I miss a dose? If you get this medication at the hospital or clinic: It is important not to miss your dose. Call your care team if you are  unable to keep an appointment. If you give yourself this medication at home: If you miss a dose, take it as soon as you can. Then continue your normal schedule. If it is almost time for your next dose, take only that dose. Do not take double or extra doses. Call your care team with questions. What may interact with this medication? Estrogen or progestin hormones Intravenous calcium  This list may not describe all possible interactions. Give your health care provider a list of all the medicines, herbs, non-prescription drugs, or dietary supplements you use. Also tell them if you smoke, drink alcohol, or use illegal drugs. Some items may interact with your medicine. What should I watch for while using this medication? Tell your care team if your symptoms do not start to get better or if they get worse. Do not treat diarrhea with over the counter products. Contact your care team if you have diarrhea that lasts more than 2 days or if it is severe and watery. If you have diabetes, you may get a false-positive result for sugar in your urine. Check with your care team. If you are being treated for a sexually transmitted infection (STI), avoid sexual contact until you have finished your treatment. Your partner may also need treatment. What side effects may I notice from receiving this medication? Side effects that you should report to your care team as soon as possible: Allergic reactions--skin rash, itching, hives, swelling of the face,  lips, tongue, or throat Hemolytic anemia--unusual weakness or fatigue, dizziness, headache, trouble breathing, dark urine, yellowing skin or eyes Severe diarrhea, fever Unusual vaginal discharge, itching, or odor Side effects that usually do not require medical attention (report to your care team if they continue or are bothersome): Diarrhea Headache Nausea Pain, redness, or irritation at injection site This list may not describe all possible side effects. Call your  doctor for medical advice about side effects. You may report side effects to FDA at 1-800-FDA-1088. Where should I keep my medication? Keep out of the reach of children and pets. You will be instructed on how to store this medication. Get rid of any unused medication after the expiration date. To get rid of medications that are no longer needed or have expired: Take the medication to a medication take-back program. Check with your pharmacy or law enforcement to find a location. If you cannot return the medication, ask your pharmacist or care team how to get rid of this medication safely. NOTE: This sheet is a summary. It may not cover all possible information. If you have questions about this medicine, talk to your doctor, pharmacist, or health care provider.  2024 Elsevier/Gold Standard (2021-05-24 00:00:00)

## 2023-10-26 NOTE — Assessment & Plan Note (Signed)
 Mild recurrent hypomagnesemia. She will receive IV magnesium  this week. We will continue to check magnesium  weekly for now.

## 2023-10-26 NOTE — Assessment & Plan Note (Signed)
 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  She has had previous iron deficiency.  Repeat iron studies did not reveal recurrent iron deficiency.  B12 and folate were normal. Her hemoglobin is stable.

## 2023-10-26 NOTE — Assessment & Plan Note (Addendum)
 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  She was treated with pelvic vulvectomy, bilateral debulking, radical lymphadenectomy, and inguinal dissection in April 2025.  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  There was lymphovascular invasion and carcinoma in situ at the 6 o'clock margin with < 1 mm margin for invasive carcinoma. PET scan revealed metastatic left inguinal lymph nodes up to 1.8 cm in diameter, but no additional evidence of metastatic disease in the neck, chest, abdomen or pelvis. There was a 4 mm right lower lobe nodule, too small for PET resolution.   She was receiving concurrent chemoradiation with weekly cisplatin , as well as pembrolizuab every 3 weeks since her PDL 1 score is 85%, followed by maintenance pembrolizumab .  She had difficulty tolerating treatment due to nausea, vomiting and diarrhea, as well as hypomagnesemia.  We gave her additional IV fluids weekly and IV magnesium  as needed.  She developed mild pancytopenia felt to be most likely due to treatment.  After her third cycle of weekly cisplatin , she was hospitalized with sepsis due to recurrent cellulitis of the left vulva and thigh.  She then developed shingles of the left thigh.  She had pancytopenia at least in part due to chemotherapy.  At discharge on July 4, WBCs were 2.5, hemoglobin 8.7 and platelets 142,000.  She had hypokalemia and hypomagnesemia resolved with IV Supple Tatian.  She was discharged on doxycycline and valacyclovir.  Due to the multiple issues due to chemotherapy, we recommended stopping cisplatin  and transitioning to maintenance pembrolizumab  once radiation is complete.  The patient has declined any further radiation or immunotherapy.  She remains without evidence of recurrence.  We are very concerned about the risk of  recurrence without continued treatment.  We will continue to see her weekly with a CBC, comprehensive metabolic panel and magnesium  for continued supportive care.

## 2023-10-26 NOTE — Radiation Completion Notes (Signed)
 Patient Name: Doris Reyes, Doris Reyes MRN: [id number] Date of Birth: [birth year] Referring Physician: ELSIE LESCHES, M.D. Date of Service: 2023-10-26 Radiation Oncologist: Lynwood Cedar, M.D. 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, N=pN0, M=cM0 Staging on 2021-06-08: 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) T=cT1c, N=cN0, M=cM0 Staging on 2023-06-28: Vulvar cancer (HCC) T=cT1b, N=cN1c, M=cM0 Intent: Curative     ==========DELIVERED PLANS==========  First Treatment Date: 2023-08-22 Last Treatment Date: 2023-09-20   Plan Name: Pelvis_Vulva Site: Vulva Technique: IMRT Mode: Photon Dose Per Fraction: 1.8 Gy Prescribed Dose (Delivered / Prescribed): 21.6 Gy / 50.4 Gy Prescribed Fxs (Delivered / Prescribed): 12 / 28     ==========ON TREATMENT VISIT DATES========== 2023-09-05, 2023-09-12, 2023-09-19     ==========UPCOMING VISITS========== 10/31/2023 [facility] INFUSION 2HR (120) [facility] INFUSION CHAIR 6  10/31/2023 [facility] EST PT 30 Cornelius Wanda DEL, MD  10/31/2023 [facility]  10/27/2023 [facility] ON[facility] INFUSION 1HR30MIN (90) [facility] INFUSION CHAIR 6  10/26/2023 [facility] INFUSION 3HR (180) [facility] INFUSION CHAIR 7        ==========APPENDIX - ON TREATMENT VISIT NOTES==========   See weekly On Treatment Notes in Epic for details in the Media tab (listed as Progress notes on the On Treatment Visit Dates listed above).

## 2023-10-26 NOTE — Progress Notes (Signed)
 Patient reports that her perineal area is still red and irritated. States she had fever and chills last pm but that she has had no fever today.

## 2023-10-26 NOTE — Assessment & Plan Note (Addendum)
 Recurrent cellulitis of the lower abdomen. Despite the fact that she has nausea and diarrhea associated with cefuroxime, we will give her IV ceftriaxone  daily for 3 days and if improved transition her to oral cephalexin .

## 2023-10-26 NOTE — Assessment & Plan Note (Signed)
 She is clinically dehydrated, so I will give her IV fluids again today. I encouraged her to continue to increase her clear liquid intake.

## 2023-10-27 ENCOUNTER — Inpatient Hospital Stay: Attending: Oncology

## 2023-10-27 ENCOUNTER — Inpatient Hospital Stay: Admitting: Hematology and Oncology

## 2023-10-27 ENCOUNTER — Encounter: Payer: Self-pay | Admitting: Hematology and Oncology

## 2023-10-27 ENCOUNTER — Other Ambulatory Visit (HOSPITAL_BASED_OUTPATIENT_CLINIC_OR_DEPARTMENT_OTHER): Payer: Self-pay

## 2023-10-27 ENCOUNTER — Ambulatory Visit

## 2023-10-27 VITALS — BP 138/66 | HR 58 | Temp 97.7°F | Resp 20

## 2023-10-27 DIAGNOSIS — Z1509 Genetic susceptibility to other malignant neoplasm: Secondary | ICD-10-CM | POA: Diagnosis not present

## 2023-10-27 DIAGNOSIS — I700 Atherosclerosis of aorta: Secondary | ICD-10-CM | POA: Diagnosis not present

## 2023-10-27 DIAGNOSIS — M8589 Other specified disorders of bone density and structure, multiple sites: Secondary | ICD-10-CM | POA: Diagnosis not present

## 2023-10-27 DIAGNOSIS — Z800 Family history of malignant neoplasm of digestive organs: Secondary | ICD-10-CM | POA: Diagnosis not present

## 2023-10-27 DIAGNOSIS — L03311 Cellulitis of abdominal wall: Secondary | ICD-10-CM

## 2023-10-27 DIAGNOSIS — Z1732 Human epidermal growth factor receptor 2 negative status: Secondary | ICD-10-CM | POA: Insufficient documentation

## 2023-10-27 DIAGNOSIS — Z79899 Other long term (current) drug therapy: Secondary | ICD-10-CM | POA: Diagnosis not present

## 2023-10-27 DIAGNOSIS — Z803 Family history of malignant neoplasm of breast: Secondary | ICD-10-CM | POA: Insufficient documentation

## 2023-10-27 DIAGNOSIS — C519 Malignant neoplasm of vulva, unspecified: Secondary | ICD-10-CM

## 2023-10-27 DIAGNOSIS — M810 Age-related osteoporosis without current pathological fracture: Secondary | ICD-10-CM | POA: Diagnosis not present

## 2023-10-27 DIAGNOSIS — Z8041 Family history of malignant neoplasm of ovary: Secondary | ICD-10-CM | POA: Diagnosis not present

## 2023-10-27 DIAGNOSIS — K449 Diaphragmatic hernia without obstruction or gangrene: Secondary | ICD-10-CM | POA: Diagnosis not present

## 2023-10-27 DIAGNOSIS — Z7982 Long term (current) use of aspirin: Secondary | ICD-10-CM | POA: Insufficient documentation

## 2023-10-27 DIAGNOSIS — Z1501 Genetic susceptibility to malignant neoplasm of breast: Secondary | ICD-10-CM | POA: Diagnosis not present

## 2023-10-27 DIAGNOSIS — Z923 Personal history of irradiation: Secondary | ICD-10-CM | POA: Diagnosis not present

## 2023-10-27 DIAGNOSIS — C50811 Malignant neoplasm of overlapping sites of right female breast: Secondary | ICD-10-CM | POA: Insufficient documentation

## 2023-10-27 DIAGNOSIS — Z170 Estrogen receptor positive status [ER+]: Secondary | ICD-10-CM | POA: Insufficient documentation

## 2023-10-27 DIAGNOSIS — Z1721 Progesterone receptor positive status: Secondary | ICD-10-CM | POA: Diagnosis not present

## 2023-10-27 DIAGNOSIS — Z1502 Genetic susceptibility to malignant neoplasm of ovary: Secondary | ICD-10-CM | POA: Diagnosis not present

## 2023-10-27 DIAGNOSIS — R911 Solitary pulmonary nodule: Secondary | ICD-10-CM | POA: Diagnosis not present

## 2023-10-27 DIAGNOSIS — E860 Dehydration: Secondary | ICD-10-CM

## 2023-10-27 MED ORDER — MAGNESIUM SULFATE 2 GM/50ML IV SOLN
2.0000 g | Freq: Once | INTRAVENOUS | Status: AC
  Administered 2023-10-27: 2 g via INTRAVENOUS
  Filled 2023-10-27: qty 50

## 2023-10-27 MED ORDER — SODIUM CHLORIDE 0.9 % IV SOLN: INTRAVENOUS | Status: DC

## 2023-10-27 MED ORDER — SODIUM CHLORIDE 0.9 % IV SOLN
2.0000 g | Freq: Once | INTRAVENOUS | Status: AC
  Administered 2023-10-27: 2 g via INTRAVENOUS
  Filled 2023-10-27: qty 20

## 2023-10-27 MED ORDER — SODIUM CHLORIDE 0.9% FLUSH
10.0000 mL | INTRAVENOUS | Status: DC | PRN
  Administered 2023-10-27: 10 mL via INTRAVENOUS

## 2023-10-27 MED ORDER — CEPHALEXIN 500 MG PO CAPS
500.0000 mg | ORAL_CAPSULE | Freq: Four times a day (QID) | ORAL | 0 refills | Status: DC
  Filled 2023-10-27: qty 54, 14d supply, fill #0

## 2023-10-27 NOTE — Patient Instructions (Signed)
Ceftriaxone Injection What is this medication? CEFTRIAXONE (sef try AX one) treats infections caused by bacteria. It belongs to a group of medications called cephalosporin antibiotics. It will not treat colds, the flu, or infections caused by viruses. This medicine may be used for other purposes; ask your health care provider or pharmacist if you have questions. COMMON BRAND NAME(S): Ceftri-IM, Ceftrisol Plus, Rocephin What should I tell my care team before I take this medication? They need to know if you have any of these conditions: Bleeding disorder High bilirubin level in newborn patients Kidney disease Liver disease Poor nutrition An unusual or allergic reaction to ceftriaxone, other penicillin or cephalosporin antibiotics, other medications, foods, dyes, or preservatives Pregnant or trying to get pregnant Breast-feeding How should I use this medication? This medication is injected into a vein or a muscle. It is usually given by your care team in a hospital or clinic setting. It may also be given at home. If you get this medication at home, you will be taught how to prepare and give it. Use exactly as directed. Take it as directed on the prescription label at the same time every day. Keep taking it even if you think you are better. It is important that you put your used needles and syringes in a special sharps container. Do not put them in a trash can. If you do not have a sharps container, call your pharmacist or care team to get one. Talk to your care team about the use of this medication in children. While it may be prescribed for children as young as newborns for selected conditions, precautions do apply. Overdosage: If you think you have taken too much of this medicine contact a poison control center or emergency room at once. NOTE: This medicine is only for you. Do not share this medicine with others. What if I miss a dose? If you get this medication at the hospital or clinic: It is  important not to miss your dose. Call your care team if you are unable to keep an appointment. If you give yourself this medication at home: If you miss a dose, take it as soon as you can. Then continue your normal schedule. If it is almost time for your next dose, take only that dose. Do not take double or extra doses. Call your care team with questions. What may interact with this medication? Estrogen or progestin hormones Intravenous calcium This list may not describe all possible interactions. Give your health care provider a list of all the medicines, herbs, non-prescription drugs, or dietary supplements you use. Also tell them if you smoke, drink alcohol, or use illegal drugs. Some items may interact with your medicine. What should I watch for while using this medication? Tell your care team if your symptoms do not start to get better or if they get worse. Do not treat diarrhea with over the counter products. Contact your care team if you have diarrhea that lasts more than 2 days or if it is severe and watery. If you have diabetes, you may get a false-positive result for sugar in your urine. Check with your care team. If you are being treated for a sexually transmitted infection (STI), avoid sexual contact until you have finished your treatment. Your partner may also need treatment. What side effects may I notice from receiving this medication? Side effects that you should report to your care team as soon as possible: Allergic reactions--skin rash, itching, hives, swelling of the face, lips, tongue, or  throat Hemolytic anemia--unusual weakness or fatigue, dizziness, headache, trouble breathing, dark urine, yellowing skin or eyes Severe diarrhea, fever Unusual vaginal discharge, itching, or odor Side effects that usually do not require medical attention (report to your care team if they continue or are bothersome): Diarrhea Headache Nausea Pain, redness, or irritation at injection  site This list may not describe all possible side effects. Call your doctor for medical advice about side effects. You may report side effects to FDA at 1-800-FDA-1088. Where should I keep my medication? Keep out of the reach of children and pets. You will be instructed on how to store this medication. Get rid of any unused medication after the expiration date. To get rid of medications that are no longer needed or have expired: Take the medication to a medication take-back program. Check with your pharmacy or law enforcement to find a location. If you cannot return the medication, ask your pharmacist or care team how to get rid of this medication safely. NOTE: This sheet is a summary. It may not cover all possible information. If you have questions about this medicine, talk to your doctor, pharmacist, or health care provider.  2024 Elsevier/Gold Standard (2021-05-24 00:00:00)

## 2023-10-27 NOTE — Progress Notes (Signed)
 Patient reports an increase in area affected by cellulitis- Burnard Foy notified. Jon Sar LPN contacted to see if Cornelius would be able to assess the area today.  1201- Patient to med onc to see provider.

## 2023-10-27 NOTE — Progress Notes (Signed)
 [facility]  [address] [place],  [place]  [address] [phone]  Clinic Day:  10/31/23  Referring physician: [name], MD  ASSESSMENT & PLAN:  Assessment: Invasive lobular carcinoma This was found on screening mammogram but the MRI reveals focal enhancement surrounding the biopsy clip up to 2.3 cm and there was non-mass enhancement posterior to this known malignancy on MRI.  This area was biopsied in order to guide her ultimate surgery.This is strongly ER/PR positive, HER2 negative and has a low Ki 67 of less than 5%.  She has now had a lumpectomy but had to go back for reexcision of the margins.  The sentinel lymph node was negative but she did have 2 primaries, measuring 2.4 cm and 2.2 cm, for a T2 N0 M0, stage IIA.  Even though she has several favorable characteristics, such as grade 1 histology and a low Ki 67, I still recommended that we pursue Endopredict testing to quantitate her risk for recurrence, and fortunately she has an EpClin score of 2.6, low risk.  This correlates with a 5.1% risk of distant recurrence in the next 10 years. Her benefit from chemotherapy would be 1.0% so we did not pursue. Her risk for a late recurrence is 4.0%. Unfortunately she has refused hormonal therapy.    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  She was treated with pelvic vulvectomy, bilateral debulking, radical lymphadenectomy, and inguinal dissection in April 2025.  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  There was lymphovascular invasion and carcinoma in situ at the 6 o'clock margin with < 1 mm margin for invasive carcinoma. PET scan revealed metastatic left inguinal lymph nodes up to 1.8 cm in diameter, but no additional evidence of metastatic disease  in the neck, chest, abdomen or pelvis. There was a 4 mm right lower lobe nodule, too small for PET resolution.    She was receiving concurrent chemoradiation with weekly cisplatin , as well as pembrolizuab every 3 weeks since her PDL 1 score is 85%, followed by maintenance pembrolizumab .  She had difficulty tolerating treatment due to nausea, vomiting and diarrhea, as well as hypomagnesemia.  We gave her additional IV fluids weekly and IV magnesium  as needed.  She developed mild pancytopenia felt to be most likely due to treatment.  After her third cycle of weekly cisplatin , she was hospitalized with sepsis due to recurrent cellulitis of the left vulva and thigh.  She then developed shingles of the left thigh.  She had pancytopenia at least in part due to chemotherapy.  At discharge on July 4, WBCs were 2.5, hemoglobin 8.7 and platelets 142,000. She was discharged on doxycycline and valacyclovir.  Due to the multiple issues with chemotherapy and radiation, she has declined taking any further therapy. She had about half of her treatment. I offered her continuation of immunotherapy but she declines that as well.    Osteopenia Her spine shows a T score of -2.6 for osteoporosis and the right femur has osteopenia.  At the very least she should be taking calcium  and vitamin D. Bone density scan done on 07/17/2023 revealed osteopenia.   Strong family history Including multiple females with breast cancer, her mother had colon cancer at age 72, and another maternal aunt had ovarian cancer.  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  She has been counseled on the implications of this and is already getting regular colonoscopy.  However the information will also be passed on to family members to pursue testing.   Plan: She currently has 2 weeks left of Keflex  for her cellulitis of the genitalia.  During physical exam I noticed a dark keratosis on her right anterior lower  tibia. Patient states that this has been there for years and has gradually gotten larger. I recommended she go to dermatology at some point to further evaluate this. We will also have her reevaluated by Dr. Viktoria gyn-oncology in the Fall. She has a WBC of 5.0, low hemoglobin of 9.5, and platelet count of 317,000. Her CMP is fairly normal other than a mildly low total protein of 6.4 and elevated creatinine of 1.06. Her magnesium  has returned to normal from 1.5 to 2.0. We re-discussed the option of immunotherapy and patient states that she declines to take any further therapy. In fact she inquired about hospice and palliative care and I explained she has no evidence of disease at present and is not considered terminal. We will continue supportive care and I will see her back in 2 weeks with CBC, CMP, and magnesium . The patient understands the plans discussed today and is in agreement with them.  She knows to contact our office if she develops concerns prior to her next appointment.  I provided 13 minutes of face-to-face time during this encounter and > 50% was spent counseling as documented under my assessment and plan.   [name], MD   [facility] A DEPT OF [facility] [address] [place] [address] Dept: [phone] Dept Fax: [phone]   No orders of the defined types were placed in this encounter.   CHIEF COMPLAINT:  CC: Stage IIA Breast cancer  Current Treatment: IV ceftriaxone   HISTORY OF PRESENT ILLNESS:  Doris Reyes 73 y.o. 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  She does have annual mammograms because of her strong family history, but was on hormone replacement therapy for 10 years.  She had a screening mammogram at [facility] on May 05, 2021 which revealed architectural distortion which was felt to be indeterminant.  She  had a diagnostic mammogram on March 3 which showed mild nipple retraction and a 1 cm area of focal asymmetry in the retroareolar region.  Ultrasound revealed a hypoechoic irregular mass measuring 9 mm in that area.  On March 14 she had a biopsy performed which revealed a 0.9 cm irregular mass with spiculated margins in the right breast at 12:00, approximately 2 cm from the nipple and anteriorly in the breast.  This was found to be grade 2 invasive mammary carcinoma as well as mammary carcinoma in situ.  The E-cadherin immunohistochemistry stains were negative and so this is consistent with invasive lobular carcinoma.  The estrogen receptors were positive at 95% and progesterone receptors positive at 95% with HER2 by immunohistochemistry positive at 2+ but FISH was negative.  Ki-67 was less than 5%.  The carcinoma in situ was positive for E-cadherin, consistent with ductal carcinoma in situ.  She had an MRI of the breast and was found to have focal enhancement in the right breast surrounding the biopsy clip and some non-mass enhancement posterior to the malignancy.  It was recommended that she have an MRI guided biopsy of the clumped non-mass enhancement area and this revealed invasive lobular carcinoma grade 1 with lobular carcinoma in situ and extensive fibrocystic changes with focal intraluminal microcalcification and intraductal papillomatosis.   Due to her strong family history including multiple females with breast cancer in her mother with colon cancer at age 73, she did have genetic testing with [facility] and was found to have a carrier mutation of MUTYH.  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  It was recommended that she have colonoscopy beginning at age 60 or 10 years prior to the age of her first-degree use relatives age at colorectal cancer diagnosis.  It was recommended this be  repeated every 5 years.  This was explained to her.   She has now had a lumpectomy but had to go back for reexcision of the margins.  The sentinel lymph node was negative but she did have 2 primaries, measuring 2.4 cm and 2.2 cm, for a T2 N0 M0, stage IIA. This was ER/PR positive, HER 2 negative and a Ki-67 was 5%.  Even though she has several favorable characteristics, such as grade 1 histology and a low Ki 67, I still recommended that we pursue Endopredict testing to quantitate her risk for recurrence, and fortunately she has an EpClin score of 2.6, low risk. This correlates with a 5.1% risk of distant recurrence in the next 10 years, with only a 1.0% benefit of chemotherapy. Her risk of late recurrence is 4.0%. I recommended hormonal therapy but she refused.   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 PET scan revealed metastatic left inguinal lymph nodes up to 1.8 cm in diameter, but no additional evidence of metastatic disease in the neck, chest, abdomen or pelvis. There was a 4 mm right lower lobe nodule, too small for PET resolution.  Patient had a pelvic vulvectomy, bilateral debulking, radical lymphadenectomy, and inguinal dissection done on 06/28/2023.  Pathology revealed 3/4 positive nodes on the left side, up to 3 cm and with extensive extranodal tumor, and negative nodes on the right side. The vulvar carcinoma measured 3 cm with depth of invasion 5 mm and carcinoma in situ, for a T1b N2c M0. She had lymphovascular invasion and carcinoma in situ at the 6 o'clock margin with < 1 mm margin for invasive carcinoma.Diagnostic bilateral mammogram done on 05/04/2023 was clear. Bone density scan done on 07/17/2023 revealed osteopenia.  Oncology History  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)  06/08/2021 Initial Diagnosis   Breast cancer in female [facility])   07/23/2021 Genetic Testing   Negative hereditary cancer genetic testing: no  pathogenic variants detected in [facility]CAP[facility] Panel. Report date is July 23, 2021.  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  The report date is Jul 26, 2021.  The BRCAplus panel offered by [employer] and includes sequencing and deletion/duplication analysis for the following 8 genes: ATM, BRCA1, BRCA2, CDH1, CHEK2, PALB2, PTEN, and TP53.  Results of pan-cancer panel pending.   The CancerNext-Expanded gene panel offered by [facility] and includes sequencing and rearrangement analysis for the following 77 genes: AIP, ALK, APC*, ATM*, AXIN2, BAP1, BARD1, BLM, BMPR1A, BRCA1*, BRCA2*, BRIP1*, CDC73, CDH1*,  CDK4, CDKN1B, CDKN2A, CHEK2*, CTNNA1, DICER1, FANCC, FH, FLCN, GALNT12, KIF1B, LZTR1, MAX, MEN1, MET, MLH1*, MSH2*, MSH3, MSH6*, MUTYH*, NBN, NF1*, NF2, NTHL1, PALB2*, PHOX2B, PMS2*, POT1, PRKAR1A, PTCH1, PTEN*, RAD51C*, RAD51D*, RB1, RECQL, RET, SDHA, SDHAF2, SDHB, SDHC, SDHD, SMAD4, SMARCA4, SMARCB1, SMARCE1, STK11, SUFU, TMEM127, TP53*, TSC1, TSC2, VHL and XRCC2 (sequencing and deletion/duplication); EGFR, EGLN1, HOXB13, KIT, MITF, PDGFRA, POLD1, and POLE (sequencing only); EPCAM and GREM1 (deletion/duplication only). DNA and RNA analyses performed for * genes.    08/23/2021 Cancer Staging   Staging form: Breast, AJCC 8th Edition - Pathologic stage from 08/23/2021: Stage IA (pT2(2), pN0(sn), cM0, G1, ER+, PR+, HER2-) - Signed by Cornelius Wanda DEL, MD on 09/29/2021 Histopathologic type: Lobular carcinoma, NOS Stage prefix: Initial diagnosis Method of lymph node assessment: Sentinel lymph node biopsy Nuclear grade: G1 Multigene prognostic tests performed: EndoPredict Histologic grading system: 3 grade system Residual tumor (R): R0 - None Laterality: Right Tumor size (mm): 24 Multiple tumors: Yes Number of tumors: 2 Lymph-vascular invasion (LVI): LVI not  present (absent)/not identified Diagnostic confirmation: Positive histology PLUS positive immunophenotyping and/or positive genetic studies Specimen type: Excision Staged by: Managing physician Menopausal status: Postmenopausal Ki-67 (%): 5 Stage used in treatment planning: Yes National guidelines used in treatment planning: Yes Type of national guideline used in treatment planning: NCCN   Vulvar cancer (HCC)  06/28/2023 Initial Diagnosis   Vulvar cancer (HCC)   06/28/2023 Cancer Staging   Staging form: Vulva, AJCC V9 - Clinical stage from 06/28/2023: FIGO Stage IIIC (cT1b, cN1c, cM0) - Signed by Cornelius Wanda DEL, MD on 07/31/2023 Histopathologic type: Squamous cell carcinoma, NOS Stage prefix: Initial diagnosis Method of lymph node assessment: Lymph node dissection Histologic grade (G): G2 Histologic grading system: 3 grade system Tumor size (mm): 30 Lymph-vascular invasion (LVI): LVI present/identified, NOS Diagnostic confirmation: Positive histology Specimen type: Excision Staged by: Managing physician Femoral-inguinal nodal status: Positive Solitary (s) or multifocal (m) tumors in the primary site: Solitary Perineural invasion (PNI): Unknown Stage used in treatment planning: Yes National guidelines used in treatment planning: Yes Type of national guideline used in treatment planning: NCCN   09/04/2023 - 09/18/2023 Chemotherapy   Patient is on Treatment Plan : CERVICAL Pembrolizumab  q21d + XRT (cisplatin  d/c'd 09/25/23)     12/22/2023 - 12/22/2023 Chemotherapy   Patient is on Treatment Plan : CERVICAL Pembrolizumab  (200) q21d      INTERVAL HISTORY:  Arynn is here today for repeat clinical assessment for her Breast cancer, stage IIA breast cancer. Patient states that she feels well and has no complaint of pain. She has 2 weeks left of her Keflex .  During physical exam I noticed a dark keratosis on her right anterior lower tibia. Patient states that this has been there for years  and has gradually gotten larger. I recommended she go to dermatology at some point to further evaluate this. We will also have her reevaluated by Dr. Viktoria gyn-oncology in the Fall. She has a WBC of 5.0, low hemoglobin of 9.5, and platelet count of 317,000. Her CMP is fairly normal other than a mildly low total protein of 6.4 and elevated creatinine of 1.06. Her magnesium  has returned to normal from 1.5 to 2.0. We re-discussed the option of immunotherapy and patient states that she declines to take any further therapy. We will continue supportive care and I will see her back in 2 weeks with CBC, CMP, and magnesium .    She denies fever, chills, night sweats, or other signs of infection. She denies cardiorespiratory and  gastrointestinal issues. She  denies pain. Her appetite is good and Her weight has been stable.    REVIEW OF SYSTEMS:   Review of Systems  Constitutional:  Negative for appetite change, chills, diaphoresis, fatigue, fever and unexpected weight change.  HENT:  Negative.  Negative for lump/mass, mouth sores and sore throat.   Eyes:  Positive for eye problems.  Respiratory: Negative.  Negative for chest tightness, cough, hemoptysis, shortness of breath and wheezing.   Cardiovascular: Negative.  Negative for chest pain, leg swelling and palpitations.  Gastrointestinal: Negative.  Negative for abdominal distention, abdominal pain, blood in stool, constipation, diarrhea, nausea and vomiting.  Endocrine: Negative.   Genitourinary: Negative.  Negative for difficulty urinating, dysuria, frequency, hematuria, vaginal bleeding and vaginal discharge.        Mild erythema of the genitalia   Musculoskeletal:  Negative for arthralgias, back pain, flank pain, gait problem and myalgias.  Skin: Negative.  Negative for rash.  Neurological:  Negative for dizziness, extremity weakness, gait problem, headaches, light-headedness, numbness, seizures and speech difficulty.  Hematological: Negative.   Negative for adenopathy. Does not bruise/bleed easily.  Psychiatric/Behavioral: Negative.  Negative for depression and sleep disturbance. The patient is not nervous/anxious.    VITALS:   Vitals:   10/31/23 0956  BP: (!) 153/74  Pulse: (!) 57  Resp: 18  Temp: 97.8 F (36.6 C)  SpO2: 99%   Wt Readings from Last 3 Encounters:  10/31/23 208 lb 11.2 oz (94.7 kg)  10/25/23 208 lb 6.4 oz (94.5 kg)  10/18/23 206 lb 11.2 oz (93.8 kg)    Body mass index is 36.97 kg/m.  Performance status (ECOG): 1 - Symptomatic but completely ambulatory  PHYSICAL EXAM:  Physical Exam Vitals and nursing note reviewed.  Constitutional:      General: She is not in acute distress.    Appearance: Normal appearance. She is normal weight. She is not ill-appearing.  HENT:     Head: Normocephalic and atraumatic.     Right Ear: Tympanic membrane, ear canal and external ear normal. There is no impacted cerumen.     Left Ear: Tympanic membrane, ear canal and external ear normal. There is no impacted cerumen.     Nose: Nose normal. No congestion or rhinorrhea.     Mouth/Throat:     Mouth: Mucous membranes are moist.     Pharynx: Oropharynx is clear. No oropharyngeal exudate or posterior oropharyngeal erythema.  Eyes:     General: No scleral icterus.       Right eye: No discharge.        Left eye: No discharge.     Extraocular Movements: Extraocular movements intact.     Conjunctiva/sclera: Conjunctivae normal.     Pupils: Pupils are equal, round, and reactive to light.  Cardiovascular:     Rate and Rhythm: Normal rate and regular rhythm.     Pulses: Normal pulses.     Heart sounds: Normal heart sounds. No murmur heard.    No friction rub. No gallop.  Pulmonary:     Effort: Pulmonary effort is normal. No respiratory distress.     Breath sounds: Normal breath sounds. No stridor. No wheezing, rhonchi or rales.  Abdominal:     General: Bowel sounds are normal. There is no distension.     Palpations:  Abdomen is soft. There is no hepatomegaly, splenomegaly or mass.     Tenderness: There is no abdominal tenderness. There is no right CVA tenderness, left CVA tenderness, guarding or rebound.  Hernia: No hernia is present.  Genitourinary:    Comments: Genitalia has some mild pink coloration No exudate Musculoskeletal:        General: Normal range of motion.     Cervical back: Normal range of motion and neck supple. No tenderness.     Right lower leg: No edema.     Left lower leg: No edema.  Lymphadenopathy:     Cervical: No cervical adenopathy.     Right cervical: No superficial, deep or posterior cervical adenopathy.    Left cervical: No superficial, deep or posterior cervical adenopathy.     Upper Body:     Right upper body: No supraclavicular, axillary or pectoral adenopathy.     Left upper body: No supraclavicular, axillary or pectoral adenopathy.  Skin:    General: Skin is warm and dry.     Coloration: Skin is not jaundiced.     Findings: Lesion present. No erythema or rash.     Comments: Right anterior lower tibia has a crusted keratotic lesion of the skin, which she says may be enlarging  Neurological:     General: No focal deficit present.     Mental Status: She is alert and oriented to person, place, and time. Mental status is at baseline.     Cranial Nerves: No cranial nerve deficit.  Psychiatric:        Mood and Affect: Mood normal.        Behavior: Behavior normal.        Thought Content: Thought content normal.        Judgment: Judgment normal.    LABS:      Latest Ref Rng & Units 10/31/2023    9:43 AM 10/25/2023   10:26 AM 10/18/2023   10:52 AM  CBC  WBC 4.0 - 10.5 K/uL 5.0  8.3  3.9   Hemoglobin 12.0 - 15.0 g/dL 9.5  9.7  9.3   Hematocrit 36.0 - 46.0 % 28.8  29.1  28.2   Platelets 150 - 400 K/uL 217  159  221       Latest Ref Rng & Units 10/31/2023    9:43 AM 10/25/2023   10:26 AM 10/18/2023   10:52 AM  CMP  Glucose 70 - 99 mg/dL 894  869  898   BUN 8 -  23 mg/dL 19  21  25    Creatinine 0.44 - 1.00 mg/dL 8.93  8.97  8.93   Sodium 135 - 145 mmol/L 140  134  140   Potassium 3.5 - 5.1 mmol/L 4.5  4.0  4.3   Chloride 98 - 111 mmol/L 105  99  104   CO2 22 - 32 mmol/L 25  22  24    Calcium  8.9 - 10.3 mg/dL 9.6  9.3  9.6   Total Protein 6.5 - 8.1 g/dL 6.4  6.4  6.4   Total Bilirubin 0.0 - 1.2 mg/dL 0.2  0.4  0.3   Alkaline Phos 38 - 126 U/L 80  85  83   AST 15 - 41 U/L 24  18  18    ALT 0 - 44 U/L 25  19  17     Lab Results  Component Value Date   TSH 0.815 10/25/2023   T4TOTAL 7.9 10/25/2023   Lab Results  Component Value Date   TIBC 441 10/11/2023   FERRITIN 65 10/11/2023   IRONPCTSAT 14 10/11/2023     STUDIES:  EXAM: 07/17/2023 DUAL X-RAY ABSORPTIOMETRY (DXA) FOR BONE MINERAL DENSITY LUMBAR  SPINE (L2-L4): BMD (in g/cm2): 0.912 T-score: -2.4 Z-score: -0.7   LEFT FEMORAL NECK: BMD (in g/cm2): 0.793 T-score: -1.8 Z-score: 0.0   LEFT TOTAL HIP: BMD (in g/cm2): 0.922 T-score: -0.7 Z-score: 0.9   RIGHT FEMORAL NECK: BMD (in g/cm2): 0.855 T-score: -1.3 Z-score: 0.5   RIGHT TOTAL HIP: BMD (in g/cm2): 0.896 T-score: -0.9 Z-score: 0.7   EXAM: 06/05/2023 NUCLEAR MEDICINE PET SKULL BASE TO THIGH IMPRESSION: 1. Metastatic left inguinal lymph nodes. No additional evidence of metastatic disease in the neck, chest, abdomen or pelvis. 2. 4 mm right lower lobe nodule, too small for PET resolution. Recommend attention on follow-up. 3. Small to moderate hiatal hernia. 4.  Aortic atherosclerosis (ICD10-I70.0).  HISTORY:   Past Medical History:  Diagnosis Date   Anemia    Anxiety    Cancer (HCC)    right breast ILC   Complication of anesthesia    difficulty waking up   Depression    Diverticulitis    Family history of breast cancer    Family history of ovarian cancer    Family history of prostate cancer    Fibromyalgia    GERD (gastroesophageal reflux disease)    Heart murmur    History of colonic polyps     History of hiatal hernia    Hyperlipidemia    Hypertension    IBS (irritable bowel syndrome)    Ischemic colitis (HCC) 03/2012   Osteoporosis    Pre-diabetes    RLS (restless legs syndrome)    Sinusitis    Sleep apnea    CPAP nightly    Past Surgical History:  Procedure Laterality Date   BREAST LUMPECTOMY WITH RADIOACTIVE SEED LOCALIZATION Right 08/04/2021   Procedure: RIGHT BREAST LUMPECTOMY WITH RADIOACTIVE SEED LOCALIZATION;  Surgeon: Vanderbilt Ned, MD;  Location: [facility];  Service: General;  Laterality: Right;   CHOLECYSTECTOMY  2004   COLONOSCOPY WITH PROPOFOL  N/A 04/18/2017   Procedure: COLONOSCOPY WITH PROPOFOL ;  Surgeon: Kristie Lamprey, MD;  Location: [facility];  Service: Endoscopy;  Laterality: N/A;   ESOPHAGOGASTRODUODENOSCOPY (EGD) WITH PROPOFOL  N/A 04/18/2017   Procedure: ESOPHAGOGASTRODUODENOSCOPY (EGD) WITH PROPOFOL ;  Surgeon: Kristie Lamprey, MD;  Location: [facility];  Service: Endoscopy;  Laterality: N/A;   EXAM UNDER ANESTHESIA, PELVIC N/A 06/28/2023   Procedure: EXAM UNDER ANESTHESIA, PELVIC;  Surgeon: Viktoria Comer SAUNDERS, MD;  Location: [facility];  Service: Gynecology;  Laterality: N/A;   FLEXIBLE SIGMOIDOSCOPY  04/20/2012   Procedure: FLEXIBLE SIGMOIDOSCOPY;  Surgeon: Belvie JONETTA Just, MD;  Location: [facility];  Service: Endoscopy;  Laterality: N/A;   INGUINAL LYMPHADENECTOMY N/A 06/28/2023   Procedure: LYMPHADENECTOMY, INGUINAL, OPEN;  Surgeon: Viktoria Comer SAUNDERS, MD;  Location: [facility];  Service: Gynecology;  Laterality: N/A;   IR IMAGING GUIDED PORT INSERTION  07/31/2023   NISSEN FUNDOPLICATION  2011   PARAESOPHAGEAL HERNIA REPAIR  09/11/2009   and Nissen fundoplication   RADICAL VULVECTOMY N/A 06/28/2023   Procedure: VULVECTOMY, RADICAL;  Surgeon: Viktoria Comer SAUNDERS, MD;  Location: [facility];  Service: Gynecology;  Laterality: N/A;  Possible skin flap   RE-EXCISION OF BREAST LUMPECTOMY Right 08/18/2021   Procedure: RE-EXCISION RIGHT BREAST LUMPECTOMY;  Surgeon:  Vanderbilt Ned, MD;  Location: [facility];  Service: General;  Laterality: Right;   SENTINEL NODE BIOPSY N/A 08/04/2021   Procedure: SENTINEL NODE BIOPSY;  Surgeon: Vanderbilt Ned, MD;  Location: [facility];  Service: General;  Laterality: N/A;   TONSILLECTOMY  1960's   TOTAL ABDOMINAL HYSTERECTOMY  2001   w/ BSO  Family History  Problem Relation Age of Onset   Colon cancer Mother 44   Alzheimer's disease Mother    Heart attack Father    Cancer Maternal Aunt        x2   Alzheimer's disease Maternal Aunt    Ovarian cancer Maternal Aunt    Prostate cancer Maternal Uncle    Kidney disease Maternal Uncle    Atrial fibrillation Maternal Uncle    Stroke Paternal Uncle    Aneurysm Paternal Grandmother        brain   Stroke Paternal Grandfather    Prostate cancer Cousin        paternal first cousin   Prostate cancer Cousin        paternal first cousin   Esophageal cancer Cousin        maternal first cousin   Breast cancer Cousin        DCIS, maternal first cousin   Ovarian cancer Cousin        maternal first cousin   Rheum arthritis Other    Lung disease Neg Hx     Social History:  reports that she has never smoked. She has never used smokeless tobacco. She reports that she does not drink alcohol and does not use drugs.The patient is alone today.  Allergies:  Allergies  Allergen Reactions   Cefuroxime Other (See Comments), Diarrhea and Nausea Only    IBS   Sulfa  Antibiotics Nausea And Vomiting and Nausea Only   Tramadol Other (See Comments) and Nausea Only   Ceftin [Cefuroxime Axetil] Diarrhea   Compazine  [Prochlorperazine ] Other (See Comments)    Vision problems   Ondansetron  Hcl    Other Other (See Comments)    Pneumonia vaccine (uncoded)   Tramadol Hcl Nausea And Vomiting   Zofran  [Ondansetron ] Other (See Comments)    Vision changes    Current Medications: Current Outpatient Medications  Medication Sig Dispense Refill   acetaminophen  (TYLENOL ) 650  MG CR tablet 1,300 mg in the morning and at bedtime.     amLODipine  (NORVASC ) 5 MG tablet Take 5 mg by mouth daily.     Ascorbic Acid (VITAMIN C) 500 MG CHEW Chew 500 mg by mouth daily.     aspirin EC 81 MG tablet Take 81 mg by mouth at bedtime. Swallow whole.     atenolol  (TENORMIN ) 50 MG tablet Take 75 mg by mouth daily.      benazepril  (LOTENSIN ) 10 MG tablet Take 10 mg by mouth daily.     benzonatate (TESSALON) 100 MG capsule Take 100-200 mg by mouth at bedtime.     Biotin 1000 MCG tablet Take 1,000 mcg by mouth daily.     Calcium  Citrate-Vitamin D (CITRACAL + D PO) Take 1 tablet by mouth daily.     cephALEXin  (KEFLEX ) 500 MG capsule Take 1 capsule (500 mg total) by mouth 4 (four) times daily. 54 capsule 0   cetirizine (ZYRTEC) 10 MG tablet Take 10 mg by mouth at bedtime.     Cholecalciferol (VITAMIN D) 50 MCG (2000 UT) tablet Take 2,000 Units by mouth daily.     cyanocobalamin  1000 MCG tablet Take 1,000 mcg by mouth daily.     diclofenac sodium (VOLTAREN) 1 % GEL Apply 1 application  topically daily as needed (for pain).     dicyclomine (BENTYL) 10 MG capsule Take 1 capsule by mouth as needed.     diphenhydrAMINE  (BENADRYL ) 25 MG tablet Take 25 mg by mouth every 6 (six) hours as  needed for allergies.     diphenoxylate-atropine (LOMOTIL) 2.5-0.025 MG tablet Take 2 tablets by mouth 4 (four) times daily as needed for diarrhea or loose stools.     DULoxetine  (CYMBALTA ) 60 MG capsule 2 capsules Orally once a day     EPINEPHrine  (EPIPEN  2-PAK) 0.3 mg/0.3 mL IJ SOAJ injection Inject 0.3 mg into the muscle as needed for anaphylaxis. (Patient not taking: Reported on 09/11/2023)     famotidine  (PEPCID ) 20 MG tablet Take 20 mg by mouth at bedtime.     fexofenadine (ALLEGRA) 180 MG tablet Take 180 mg by mouth in the morning.     fluticasone  (FLONASE ) 50 MCG/ACT nasal spray Place 1 spray into both nostrils daily.     lidocaine  (XYLOCAINE ) 5 % ointment Apply 1 Application topically 2 (two) times daily as  needed for moderate pain (pain score 4-6) (to the vulva). Apply to vulva as needed for discomfort 35.44 g 3   lidocaine  4 % Place 1 patch onto the skin daily.     LORazepam  (ATIVAN ) 1 MG tablet Take 1 tablet (1 mg total) by mouth every 8 (eight) hours. (Patient not taking: Reported on 10/18/2023) 30 tablet 0   NON FORMULARY Pt uses a c-pap nightly     Nutritional Supplements (JUICE PLUS FIBRE PO) Take 2 each by mouth daily. Fruits and Vegetables     nystatin  (MYCOSTATIN ) 100000 UNIT/ML suspension Take 5 mLs (500,000 Units total) by mouth 4 (four) times daily. 60 mL 2   nystatin  (MYCOSTATIN /NYSTOP ) powder Apply 1 Application topically 3 (three) times daily. 30 g 5   Omega-3 Fatty Acids (FISH OIL) 1000 MG CAPS Take 1,000 mg by mouth daily.      pantoprazole  (PROTONIX ) 40 MG tablet Take 40 mg by mouth every evening.     Phenylephrine -APAP-Guaifenesin (TYLENOL  SINUS SEVERE PO) Take 2 tablets by mouth daily as needed (drainage).     polyvinyl alcohol (LIQUIFILM TEARS) 1.4 % ophthalmic solution Place 1 drop into both eyes 5 (five) times daily.     Probiotic Product (PROBIOTIC & ACIDOPHILUS EX ST PO) Take 1 capsule by mouth daily. Ultra Flora Plus Capsules     Propylene Glycol (SYSTANE COMPLETE) 0.6 % SOLN Place 1 drop into both eyes in the morning and at bedtime.     rOPINIRole  (REQUIP ) 0.5 MG tablet Take 0.5 mg by mouth at bedtime.     senna-docusate (SENOKOT-S) 8.6-50 MG tablet Take 2 tablets by mouth at bedtime. For AFTER surgery, do not take if having diarrhea 30 tablet 0   simvastatin  (ZOCOR ) 10 MG tablet Take 10 mg by mouth at bedtime.       sucralfate  (CARAFATE ) 1 GM/10ML suspension Take 1 g by mouth 2 (two) times daily.     traZODone  (DESYREL ) 50 MG tablet Take 50 mg by mouth at bedtime.     triamcinolone  (KENALOG ) 0.025 % ointment Apply 1 Application topically 2 (two) times daily. 30 g 0   vitamin E 400 UNIT capsule Take 400 Units by mouth daily.       White Petrolatum -Mineral Oil (REFRESH P.M.  OP) Place 1 Application into both eyes at bedtime.     No current facility-administered medications for this visit.      I,Jasmine M Lassiter,acting as a scribe for [name], MD.,have documented all relevant documentation on the behalf of [name], MD,as directed by  [name], MD while in the presence of [name], MD.

## 2023-10-27 NOTE — Assessment & Plan Note (Signed)
 Recurrent cellulitis of the lower abdomen.  She has been receiving IV ceftriaxone  daily for 3 days.  She was concerned this may not be improving, but it is significantly improved from previous.  I will transition her to oral cephalexin  500 mg 4 times daily.  We will plan to see her back August 5 as scheduled.

## 2023-10-27 NOTE — Assessment & Plan Note (Signed)
 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  She was treated with pelvic vulvectomy, bilateral debulking, radical lymphadenectomy, and inguinal dissection in April 2025.  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  There was lymphovascular invasion and carcinoma in situ at the 6 o'clock margin with < 1 mm margin for invasive carcinoma. PET scan revealed metastatic left inguinal lymph nodes up to 1.8 cm in diameter, but no additional evidence of metastatic disease in the neck, chest, abdomen or pelvis. There was a 4 mm right lower lobe nodule, too small for PET resolution.   She was receiving concurrent chemoradiation with weekly cisplatin , as well as pembrolizuab every 3 weeks since her PDL 1 score is 85%, followed by maintenance pembrolizumab .  She had difficulty tolerating treatment due to nausea, vomiting and diarrhea, as well as hypomagnesemia.  We gave her additional IV fluids weekly and IV magnesium  as needed.  She developed mild pancytopenia felt to be most likely due to treatment.  After her third cycle of weekly cisplatin , she was hospitalized with sepsis due to recurrent cellulitis of the left vulva and thigh.  She then developed shingles of the left thigh.  She had pancytopenia at least in part due to chemotherapy.  At discharge on July 4, WBCs were 2.5, hemoglobin 8.7 and platelets 142,000.  She had hypokalemia and hypomagnesemia resolved with IV Supple Tatian.  She was discharged on doxycycline and valacyclovir.  Due to the multiple issues due to chemotherapy, we recommended stopping cisplatin  and transitioning to maintenance pembrolizumab  once radiation is complete.  The patient has declined any further radiation or immunotherapy.  She remains without evidence of recurrence.  We are very concerned about the risk of  recurrence without continued treatment.  Dr. Cornelius saw her with me today and discussed the importance of considering continuing single agent immunotherapy to decrease the risk of recurrence.  Had we will continue to see her weekly with a CBC, comprehensive metabolic panel and magnesium  for continued supportive care.

## 2023-10-27 NOTE — Progress Notes (Signed)
 [facility]  [address] [place],  [place]  [address] [phone]  Clinic Day:  10/27/2023  Referring physician: [name], MD  ASSESSMENT & PLAN:   Assessment & Plan: Cellulitis Recurrent cellulitis of the lower abdomen.  She has been receiving IV ceftriaxone  daily for 3 days.  She was concerned this may not be improving, but it is significantly improved from previous.  I will transition her to oral cephalexin  500 mg 4 times daily.  We will plan to see her back August 5 as scheduled.  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  She was treated with pelvic vulvectomy, bilateral debulking, radical lymphadenectomy, and inguinal dissection in April 2025.  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  There was lymphovascular invasion and carcinoma in situ at the 6 o'clock margin with < 1 mm margin for invasive carcinoma. PET scan revealed metastatic left inguinal lymph nodes up to 1.8 cm in diameter, but no additional evidence of metastatic disease in the neck, chest, abdomen or pelvis. There was a 4 mm right lower lobe nodule, too small for PET resolution.   She was receiving concurrent chemoradiation with weekly cisplatin , as well as pembrolizuab every 3 weeks since her PDL 1 score is 85%, followed by maintenance pembrolizumab .  She had difficulty tolerating treatment due to nausea, vomiting and diarrhea, as well as hypomagnesemia.  We gave her additional IV fluids weekly and IV magnesium  as needed.  She developed mild pancytopenia felt to be most likely due to treatment.  After her third cycle of weekly cisplatin , she was hospitalized with sepsis due to recurrent cellulitis of the left vulva and thigh.  She then developed shingles of the left thigh.  She had  pancytopenia at least in part due to chemotherapy.  At discharge on July 4, WBCs were 2.5, hemoglobin 8.7 and platelets 142,000.  She had hypokalemia and hypomagnesemia resolved with IV Supple Tatian.  She was discharged on doxycycline and valacyclovir.  Due to the multiple issues due to chemotherapy, we recommended stopping cisplatin  and transitioning to maintenance pembrolizumab  once radiation is complete.  The patient has declined any further radiation or immunotherapy.  She remains without evidence of recurrence.  We are very concerned about the risk of recurrence without continued treatment.  Dr. Cornelius saw her with me today and discussed the importance of considering continuing single agent immunotherapy to decrease the risk of recurrence.  Had we will continue to see her weekly with a CBC, comprehensive metabolic panel and magnesium  for continued supportive care.    The patient understands the plans discussed today and is in agreement with them.  She knows to contact our office if she develops concerns prior to her next appointment.   I provided 20 minutes of face-to-face time during this encounter and > 50% was spent counseling as documented under my assessment and plan.    Dionisios Ricci A Tekeya Geffert, PA-C  [facility] [place] - A DEPT OF [facility]. [facility] [address] [place] [address] Dept: [phone] Dept Fax: [phone]   No orders of the defined types were placed in this encounter.     CHIEF COMPLAINT:  CC: Recurrent cellulitis of the lower abdominal wall  Current Treatment: IV ceftriaxone   HISTORY OF PRESENT  ILLNESS:   Oncology History  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)  06/08/2021 Initial Diagnosis   Breast cancer in female [facility])   07/23/2021 Genetic Testing   Negative hereditary cancer genetic testing: no pathogenic variants detected in [facility] BRCAPlus Panel. Report date is July 23, 2021.  MUTYH [id number] single  pathogenic mutation identified on the CancerNext-Expanded+RNAinsight panel.  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  The report date is Jul 26, 2021.  The BRCAplus panel offered by [employer] and includes sequencing and deletion/duplication analysis for the following 8 genes: ATM, BRCA1, BRCA2, CDH1, CHEK2, PALB2, PTEN, and TP53.  Results of pan-cancer panel pending.   The CancerNext-Expanded gene panel offered by [facility] and includes sequencing and rearrangement analysis for the following 77 genes: AIP, ALK, APC*, ATM*, AXIN2, BAP1, BARD1, BLM, BMPR1A, BRCA1*, BRCA2*, BRIP1*, CDC73, CDH1*, CDK4, CDKN1B, CDKN2A, CHEK2*, CTNNA1, DICER1, FANCC, FH, FLCN, GALNT12, KIF1B, LZTR1, MAX, MEN1, MET, MLH1*, MSH2*, MSH3, MSH6*, MUTYH*, NBN, NF1*, NF2, NTHL1, PALB2*, PHOX2B, PMS2*, POT1, PRKAR1A, PTCH1, PTEN*, RAD51C*, RAD51D*, RB1, RECQL, RET, SDHA, SDHAF2, SDHB, SDHC, SDHD, SMAD4, SMARCA4, SMARCB1, SMARCE1, STK11, SUFU, TMEM127, TP53*, TSC1, TSC2, VHL and XRCC2 (sequencing and deletion/duplication); EGFR, EGLN1, HOXB13, KIT, MITF, PDGFRA, POLD1, and POLE (sequencing only); EPCAM and GREM1 (deletion/duplication only). DNA and RNA analyses performed for * genes.    08/23/2021 Cancer Staging   Staging form: Breast, AJCC 8th Edition - Pathologic stage from 08/23/2021: Stage IA (pT2(2), pN0(sn), cM0, G1, ER+, PR+, HER2-) - Signed by Cornelius Wanda DEL, MD on 09/29/2021 Histopathologic type: Lobular carcinoma, NOS Stage prefix: Initial diagnosis Method of lymph node assessment: Sentinel lymph node biopsy Nuclear grade: G1 Multigene prognostic tests performed: EndoPredict Histologic grading system: 3 grade system Residual tumor (R): R0 - None Laterality: Right Tumor size (mm): 24 Multiple tumors: Yes Number of tumors: 2 Lymph-vascular invasion (LVI): LVI not present (absent)/not identified Diagnostic confirmation: Positive histology PLUS positive immunophenotyping  and/or positive genetic studies Specimen type: Excision Staged by: Managing physician Menopausal status: Postmenopausal Ki-67 (%): 5 Stage used in treatment planning: Yes National guidelines used in treatment planning: Yes Type of national guideline used in treatment planning: NCCN   Vulvar cancer (HCC)  06/28/2023 Initial Diagnosis   Vulvar cancer (HCC)   06/28/2023 Cancer Staging   Staging form: Vulva, AJCC V9 - Clinical stage from 06/28/2023: FIGO Stage IIIC (cT1b, cN1c, cM0) - Signed by Cornelius Wanda DEL, MD on 07/31/2023 Histopathologic type: Squamous cell carcinoma, NOS Stage prefix: Initial diagnosis Method of lymph node assessment: Lymph node dissection Histologic grade (G): G2 Histologic grading system: 3 grade system Tumor size (mm): 30 Lymph-vascular invasion (LVI): LVI present/identified, NOS Diagnostic confirmation: Positive histology Specimen type: Excision Staged by: Managing physician Femoral-inguinal nodal status: Positive Solitary (s) or multifocal (m) tumors in the primary site: Solitary Perineural invasion (PNI): Unknown Stage used in treatment planning: Yes National guidelines used in treatment planning: Yes Type of national guideline used in treatment planning: NCCN   09/04/2023 - 09/18/2023 Chemotherapy   Patient is on Treatment Plan : CERVICAL Pembrolizumab  q21d + XRT (cisplatin  d/c'd 09/25/23)     10/23/2023 -  Chemotherapy   Patient is on Treatment Plan : CERVICAL Pembrolizumab  (200) q21d         INTERVAL HISTORY:   Havana is here today for repeat clinical assessment.  She is concerned that the cellulitis may not be improving, as the swelling has worsened.  She has not had continued fevers or severe  headache.  She denies pain. Her appetite is good. Her weight has been stable.  REVIEW OF SYSTEMS:   Review of Systems  Constitutional:  Negative for appetite change, chills, diaphoresis, fatigue, fever and unexpected weight change.  HENT:   Negative for  lump/mass, mouth sores and sore throat.   Respiratory:  Negative for cough and shortness of breath.   Cardiovascular:  Negative for chest pain and leg swelling.  Gastrointestinal:  Negative for abdominal pain, constipation, diarrhea, nausea and vomiting.  Genitourinary:  Negative for difficulty urinating, dysuria, frequency, hematuria, vaginal bleeding and vaginal discharge.   Musculoskeletal:  Negative for arthralgias, back pain, gait problem and myalgias.  Skin:  Negative for rash.  Neurological:  Negative for dizziness, extremity weakness, gait problem, headaches and light-headedness.  Hematological:  Negative for adenopathy. Does not bruise/bleed easily.  Psychiatric/Behavioral:  Negative for depression and sleep disturbance. The patient is not nervous/anxious.      VITALS:    10/27/2023  BASIC VITALS   BP 138/66   Site Right Arm   Patient Position Sitting   Patient Position (if appropriate)   Pulse Rate 58 !   Resp 20   Temp 97.7 F (36.5 C)   Height   Weight   BMI (Calculated)   ORTHOSTATICS   Patient Position Sitting   OXIMETRY   SpO2 99 %   O2 Flow Rate (L/min)     Legend: ! Abnormal  Wt Readings from Last 3 Encounters:  10/25/23 208 lb 6.4 oz (94.5 kg)  10/18/23 206 lb 11.2 oz (93.8 kg)  10/11/23 211 lb 6.4 oz (95.9 kg)    There is no height or weight on file to calculate BMI.  Performance status (ECOG): 2 - Symptomatic, <50% confined to bed    PHYSICAL EXAM:   Physical Exam Vitals and nursing note reviewed.  Constitutional:      General: She is not in acute distress.    Appearance: Normal appearance. She is not ill-appearing.  HENT:     Head: Normocephalic and atraumatic.     Mouth/Throat:     Mouth: Mucous membranes are moist.     Pharynx: Oropharynx is clear. No oropharyngeal exudate or posterior oropharyngeal erythema.  Eyes:     General: No scleral icterus.    Extraocular Movements: Extraocular movements intact.     Conjunctiva/sclera:  Conjunctivae normal.     Pupils: Pupils are equal, round, and reactive to light.  Cardiovascular:     Rate and Rhythm: Normal rate and regular rhythm.     Heart sounds: Normal heart sounds. No murmur heard.    No friction rub. No gallop.  Pulmonary:     Effort: Pulmonary effort is normal.     Breath sounds: Normal breath sounds. No wheezing, rhonchi or rales.  Abdominal:     General: There is no distension.     Palpations: Abdomen is soft. There is no mass.     Tenderness: There is no abdominal tenderness.  Genitourinary:    Comments: There is edema of the vulva.  There is no evidence of recurrent vulvar cancer.  There is a small submucosal hemorrhage of the right vulva. Musculoskeletal:        General: Normal range of motion.     Cervical back: Normal range of motion and neck supple. No tenderness.     Right lower leg: No edema.     Left lower leg: No edema.  Lymphadenopathy:     Cervical: No cervical adenopathy.  Skin:  General: Skin is warm and dry.     Coloration: Skin is not jaundiced.     Findings: Erythema present. No rash.     Comments: The erythema of the lower abdominal wall is significantly improved with decreased warmth.  There is worsening edema now extending to the pelvis and vulva.  Neurological:     Mental Status: She is alert and oriented to person, place, and time.     Cranial Nerves: No cranial nerve deficit.  Psychiatric:        Mood and Affect: Mood normal.        Behavior: Behavior normal.        Thought Content: Thought content normal.     LABS:      Latest Ref Rng & Units 10/25/2023   10:26 AM 10/18/2023   10:52 AM 10/11/2023   12:45 PM  CBC  WBC 4.0 - 10.5 K/uL 8.3  3.9  3.6   Hemoglobin 12.0 - 15.0 g/dL 9.7  9.3  8.7   Hematocrit 36.0 - 46.0 % 29.1  28.2  27.0   Platelets 150 - 400 K/uL 159  221  299       Latest Ref Rng & Units 10/25/2023   10:26 AM 10/18/2023   10:52 AM 10/11/2023   12:45 PM  CMP  Glucose 70 - 99 mg/dL 869  898  895    BUN 8 - 23 mg/dL 21  25  22    Creatinine 0.44 - 1.00 mg/dL 8.97  8.93  8.86   Sodium 135 - 145 mmol/L 134  140  141   Potassium 3.5 - 5.1 mmol/L 4.0  4.3  4.0   Chloride 98 - 111 mmol/L 99  104  103   CO2 22 - 32 mmol/L 22  24  25    Calcium  8.9 - 10.3 mg/dL 9.3  9.6  9.4   Total Protein 6.5 - 8.1 g/dL 6.4  6.4  6.4   Total Bilirubin 0.0 - 1.2 mg/dL 0.4  0.3  0.2   Alkaline Phos 38 - 126 U/L 85  83  92   AST 15 - 41 U/L 18  18  24    ALT 0 - 44 U/L 19  17  30      Lab Results  Component Value Date   TIBC 441 10/11/2023   FERRITIN 65 10/11/2023   IRONPCTSAT 14 10/11/2023     STUDIES:   No results found.    HISTORY:   Past Medical History:  Diagnosis Date   Anemia    Anxiety    Cancer (HCC)    right breast ILC   Complication of anesthesia    difficulty waking up   Depression    Diverticulitis    Family history of breast cancer    Family history of ovarian cancer    Family history of prostate cancer    Fibromyalgia    GERD (gastroesophageal reflux disease)    Heart murmur    History of colonic polyps    History of hiatal hernia    Hyperlipidemia    Hypertension    IBS (irritable bowel syndrome)    Ischemic colitis (HCC) 03/2012   Osteoporosis    Pre-diabetes    RLS (restless legs syndrome)    Sinusitis    Sleep apnea    CPAP nightly    Past Surgical History:  Procedure Laterality Date   BREAST LUMPECTOMY WITH RADIOACTIVE SEED LOCALIZATION Right 08/04/2021   Procedure: RIGHT BREAST LUMPECTOMY WITH RADIOACTIVE SEED  LOCALIZATION;  Surgeon: Vanderbilt Ned, MD;  Location: [facility] OR;  Service: General;  Laterality: Right;   CHOLECYSTECTOMY  2004   COLONOSCOPY WITH PROPOFOL  N/A 04/18/2017   Procedure: COLONOSCOPY WITH PROPOFOL ;  Surgeon: Kristie Lamprey, MD;  Location: [facility];  Service: Endoscopy;  Laterality: N/A;   ESOPHAGOGASTRODUODENOSCOPY (EGD) WITH PROPOFOL  N/A 04/18/2017   Procedure: ESOPHAGOGASTRODUODENOSCOPY (EGD) WITH PROPOFOL ;  Surgeon: Kristie Lamprey,  MD;  Location: [facility];  Service: Endoscopy;  Laterality: N/A;   EXAM UNDER ANESTHESIA, PELVIC N/A 06/28/2023   Procedure: EXAM UNDER ANESTHESIA, PELVIC;  Surgeon: Viktoria Comer SAUNDERS, MD;  Location: [facility] ORS;  Service: Gynecology;  Laterality: N/A;   FLEXIBLE SIGMOIDOSCOPY  04/20/2012   Procedure: FLEXIBLE SIGMOIDOSCOPY;  Surgeon: Belvie JONETTA Just, MD;  Location: [facility];  Service: Endoscopy;  Laterality: N/A;   INGUINAL LYMPHADENECTOMY N/A 06/28/2023   Procedure: LYMPHADENECTOMY, INGUINAL, OPEN;  Surgeon: Viktoria Comer SAUNDERS, MD;  Location: [facility];  Service: Gynecology;  Laterality: N/A;   IR IMAGING GUIDED PORT INSERTION  07/31/2023   NISSEN FUNDOPLICATION  2011   PARAESOPHAGEAL HERNIA REPAIR  09/11/2009   and Nissen fundoplication   RADICAL VULVECTOMY N/A 06/28/2023   Procedure: VULVECTOMY, RADICAL;  Surgeon: Viktoria Comer SAUNDERS, MD;  Location: [facility];  Service: Gynecology;  Laterality: N/A;  Possible skin flap   RE-EXCISION OF BREAST LUMPECTOMY Right 08/18/2021   Procedure: RE-EXCISION RIGHT BREAST LUMPECTOMY;  Surgeon: Vanderbilt Ned, MD;  Location: [facility];  Service: General;  Laterality: Right;   SENTINEL NODE BIOPSY N/A 08/04/2021   Procedure: SENTINEL NODE BIOPSY;  Surgeon: Vanderbilt Ned, MD;  Location: [facility] OR;  Service: General;  Laterality: N/A;   TONSILLECTOMY  1960's   TOTAL ABDOMINAL HYSTERECTOMY  2001   w/ BSO    Family History  Problem Relation Age of Onset   Colon cancer Mother 1   Alzheimer's disease Mother    Heart attack Father    Cancer Maternal Aunt        x2   Alzheimer's disease Maternal Aunt    Ovarian cancer Maternal Aunt    Prostate cancer Maternal Uncle    Kidney disease Maternal Uncle    Atrial fibrillation Maternal Uncle    Stroke Paternal Uncle    Aneurysm Paternal Grandmother        brain   Stroke Paternal Grandfather    Prostate cancer Cousin        paternal first cousin   Prostate cancer Cousin        paternal first cousin    Esophageal cancer Cousin        maternal first cousin   Breast cancer Cousin        DCIS, maternal first cousin   Ovarian cancer Cousin        maternal first cousin   Rheum arthritis Other    Lung disease Neg Hx     Social History:  reports that she has never smoked. She has never used smokeless tobacco. She reports that she does not drink alcohol and does not use drugs.The patient is alone today.  Allergies:  Allergies  Allergen Reactions   Cefuroxime Other (See Comments), Diarrhea and Nausea Only    IBS   Sulfa  Antibiotics Nausea And Vomiting and Nausea Only   Tramadol Other (See Comments) and Nausea Only   Ceftin [Cefuroxime Axetil] Diarrhea   Compazine  [Prochlorperazine ] Other (See Comments)    Vision problems   Ondansetron  Hcl    Other Other (See Comments)    Pneumonia  vaccine (uncoded)   Tramadol Hcl Nausea And Vomiting   Zofran  [Ondansetron ] Other (See Comments)    Vision changes    Current Medications: Current Outpatient Medications  Medication Sig Dispense Refill   cephALEXin  (KEFLEX ) 500 MG capsule Take 1 capsule (500 mg total) by mouth 4 (four) times daily. 54 capsule 0   acetaminophen  (TYLENOL ) 650 MG CR tablet 1,300 mg in the morning and at bedtime.     amLODipine  (NORVASC ) 5 MG tablet Take 5 mg by mouth daily.     Ascorbic Acid (VITAMIN C) 500 MG CHEW Chew 500 mg by mouth daily.     aspirin EC 81 MG tablet Take 81 mg by mouth at bedtime. Swallow whole.     atenolol  (TENORMIN ) 50 MG tablet Take 75 mg by mouth daily.      benazepril  (LOTENSIN ) 10 MG tablet Take 10 mg by mouth daily.     benzonatate (TESSALON) 100 MG capsule Take 100-200 mg by mouth at bedtime.     Biotin 1000 MCG tablet Take 1,000 mcg by mouth daily.     Calcium  Citrate-Vitamin D (CITRACAL + D PO) Take 1 tablet by mouth daily.     cetirizine (ZYRTEC) 10 MG tablet Take 10 mg by mouth at bedtime.     Cholecalciferol (VITAMIN D) 50 MCG (2000 UT) tablet Take 2,000 Units by mouth daily.      cyanocobalamin 1000 MCG tablet Take 1,000 mcg by mouth daily.     diclofenac sodium (VOLTAREN) 1 % GEL Apply 1 application  topically daily as needed (for pain).     dicyclomine (BENTYL) 10 MG capsule Take 1 capsule by mouth as needed.     diphenhydrAMINE  (BENADRYL ) 25 MG tablet Take 25 mg by mouth every 6 (six) hours as needed for allergies.     diphenoxylate-atropine (LOMOTIL) 2.5-0.025 MG tablet Take 2 tablets by mouth 4 (four) times daily as needed for diarrhea or loose stools.     DULoxetine  (CYMBALTA ) 60 MG capsule 2 capsules Orally once a day     EPINEPHrine  (EPIPEN  2-PAK) 0.3 mg/0.3 mL IJ SOAJ injection Inject 0.3 mg into the muscle as needed for anaphylaxis. (Patient not taking: Reported on 09/11/2023)     famotidine  (PEPCID ) 20 MG tablet Take 20 mg by mouth at bedtime.     fexofenadine (ALLEGRA) 180 MG tablet Take 180 mg by mouth in the morning.     fluticasone  (FLONASE ) 50 MCG/ACT nasal spray Place 1 spray into both nostrils daily.     lidocaine  (XYLOCAINE ) 5 % ointment Apply 1 Application topically 2 (two) times daily as needed for moderate pain (pain score 4-6) (to the vulva). Apply to vulva as needed for discomfort 35.44 g 3   lidocaine  4 % Place 1 patch onto the skin daily.     LORazepam  (ATIVAN ) 1 MG tablet Take 1 tablet (1 mg total) by mouth every 8 (eight) hours. (Patient not taking: Reported on 10/18/2023) 30 tablet 0   NON FORMULARY Pt uses a c-pap nightly     Nutritional Supplements (JUICE PLUS FIBRE PO) Take 2 each by mouth daily. Fruits and Vegetables     nystatin  (MYCOSTATIN ) 100000 UNIT/ML suspension Take 5 mLs (500,000 Units total) by mouth 4 (four) times daily. 60 mL 2   nystatin  (MYCOSTATIN /NYSTOP ) powder Apply 1 Application topically 3 (three) times daily. 30 g 5   Omega-3 Fatty Acids (FISH OIL) 1000 MG CAPS Take 1,000 mg by mouth daily.      pantoprazole  (PROTONIX ) 40 MG tablet  Take 40 mg by mouth every evening.     Phenylephrine -APAP-Guaifenesin (TYLENOL  SINUS SEVERE  PO) Take 2 tablets by mouth daily as needed (drainage).     polyvinyl alcohol (LIQUIFILM TEARS) 1.4 % ophthalmic solution Place 1 drop into both eyes 5 (five) times daily.     Probiotic Product (PROBIOTIC & ACIDOPHILUS EX ST PO) Take 1 capsule by mouth daily. Ultra Flora Plus Capsules     Propylene Glycol (SYSTANE COMPLETE) 0.6 % SOLN Place 1 drop into both eyes in the morning and at bedtime.     rOPINIRole  (REQUIP ) 0.5 MG tablet Take 0.5 mg by mouth at bedtime.     senna-docusate (SENOKOT-S) 8.6-50 MG tablet Take 2 tablets by mouth at bedtime. For AFTER surgery, do not take if having diarrhea 30 tablet 0   simvastatin  (ZOCOR ) 10 MG tablet Take 10 mg by mouth at bedtime.       sucralfate  (CARAFATE ) 1 GM/10ML suspension Take 1 g by mouth 2 (two) times daily.     traZODone  (DESYREL ) 50 MG tablet Take 50 mg by mouth at bedtime.     triamcinolone  (KENALOG ) 0.025 % ointment Apply 1 Application topically 2 (two) times daily. 30 g 0   vitamin E 400 UNIT capsule Take 400 Units by mouth daily.       White Petrolatum -Mineral Oil (REFRESH P.M. OP) Place 1 Application into both eyes at bedtime.     No current facility-administered medications for this visit.   Facility-Administered Medications Ordered in Other Visits  Medication Dose Route Frequency Provider Last Rate Last Admin   0.9 %  sodium chloride  infusion   Intravenous Continuous Susanna Benge A, PA-C   Stopped at 10/27/23 1327   sodium chloride  flush (NS) 0.9 % injection 10 mL  10 mL Intravenous PRN Lenice Koper A, PA-C   10 mL at 10/27/23 1331

## 2023-10-30 ENCOUNTER — Ambulatory Visit

## 2023-10-31 ENCOUNTER — Inpatient Hospital Stay

## 2023-10-31 ENCOUNTER — Encounter: Payer: Self-pay | Admitting: Oncology

## 2023-10-31 ENCOUNTER — Inpatient Hospital Stay (HOSPITAL_BASED_OUTPATIENT_CLINIC_OR_DEPARTMENT_OTHER): Admitting: Oncology

## 2023-10-31 ENCOUNTER — Telehealth: Payer: Self-pay | Admitting: Oncology

## 2023-10-31 ENCOUNTER — Ambulatory Visit

## 2023-10-31 ENCOUNTER — Other Ambulatory Visit: Payer: Self-pay | Admitting: Oncology

## 2023-10-31 VITALS — BP 153/74 | HR 57 | Temp 97.8°F | Resp 18 | Ht 63.0 in | Wt 208.7 lb

## 2023-10-31 DIAGNOSIS — C519 Malignant neoplasm of vulva, unspecified: Secondary | ICD-10-CM | POA: Diagnosis not present

## 2023-10-31 DIAGNOSIS — L03311 Cellulitis of abdominal wall: Secondary | ICD-10-CM | POA: Diagnosis not present

## 2023-10-31 DIAGNOSIS — Z1721 Progesterone receptor positive status: Secondary | ICD-10-CM | POA: Diagnosis not present

## 2023-10-31 DIAGNOSIS — C50111 Malignant neoplasm of central portion of right female breast: Secondary | ICD-10-CM | POA: Diagnosis not present

## 2023-10-31 DIAGNOSIS — Z1732 Human epidermal growth factor receptor 2 negative status: Secondary | ICD-10-CM | POA: Diagnosis not present

## 2023-10-31 DIAGNOSIS — C50811 Malignant neoplasm of overlapping sites of right female breast: Secondary | ICD-10-CM | POA: Diagnosis not present

## 2023-10-31 DIAGNOSIS — Z170 Estrogen receptor positive status [ER+]: Secondary | ICD-10-CM | POA: Diagnosis not present

## 2023-10-31 LAB — CBC WITH DIFFERENTIAL (CANCER CENTER ONLY)
Abs Immature Granulocytes: 0.17 K/uL — ABNORMAL HIGH (ref 0.00–0.07)
Basophils Absolute: 0 K/uL (ref 0.0–0.1)
Basophils Relative: 1 %
Eosinophils Absolute: 0.2 K/uL (ref 0.0–0.5)
Eosinophils Relative: 4 %
HCT: 28.8 % — ABNORMAL LOW (ref 36.0–46.0)
Hemoglobin: 9.5 g/dL — ABNORMAL LOW (ref 12.0–15.0)
Immature Granulocytes: 3 %
Lymphocytes Relative: 15 %
Lymphs Abs: 0.7 K/uL (ref 0.7–4.0)
MCH: 29.2 pg (ref 26.0–34.0)
MCHC: 33 g/dL (ref 30.0–36.0)
MCV: 88.6 fL (ref 80.0–100.0)
Monocytes Absolute: 0.7 K/uL (ref 0.1–1.0)
Monocytes Relative: 14 %
Neutro Abs: 3.2 K/uL (ref 1.7–7.7)
Neutrophils Relative %: 63 %
Platelet Count: 217 K/uL (ref 150–400)
RBC: 3.25 MIL/uL — ABNORMAL LOW (ref 3.87–5.11)
RDW: 17.4 % — ABNORMAL HIGH (ref 11.5–15.5)
WBC Count: 5 K/uL (ref 4.0–10.5)
nRBC: 0 % (ref 0.0–0.2)

## 2023-10-31 LAB — CMP (CANCER CENTER ONLY)
ALT: 25 U/L (ref 0–44)
AST: 24 U/L (ref 15–41)
Albumin: 4.1 g/dL (ref 3.5–5.0)
Alkaline Phosphatase: 80 U/L (ref 38–126)
Anion gap: 10 (ref 5–15)
BUN: 19 mg/dL (ref 8–23)
CO2: 25 mmol/L (ref 22–32)
Calcium: 9.6 mg/dL (ref 8.9–10.3)
Chloride: 105 mmol/L (ref 98–111)
Creatinine: 1.06 mg/dL — ABNORMAL HIGH (ref 0.44–1.00)
GFR, Estimated: 55 mL/min — ABNORMAL LOW (ref >60)
Glucose, Bld: 105 mg/dL — ABNORMAL HIGH (ref 70–99)
Potassium: 4.5 mmol/L (ref 3.5–5.1)
Sodium: 140 mmol/L (ref 135–145)
Total Bilirubin: 0.2 mg/dL (ref 0.0–1.2)
Total Protein: 6.4 g/dL — ABNORMAL LOW (ref 6.5–8.1)

## 2023-10-31 LAB — MAGNESIUM: Magnesium: 2 mg/dL (ref 1.7–2.4)

## 2023-10-31 NOTE — Telephone Encounter (Signed)
 Patient has been scheduled for follow-up visit per 10/27/23 LOS.  Pt aware of scheduled appt details.

## 2023-11-01 ENCOUNTER — Ambulatory Visit

## 2023-11-01 DIAGNOSIS — I10 Essential (primary) hypertension: Secondary | ICD-10-CM | POA: Diagnosis not present

## 2023-11-01 DIAGNOSIS — D630 Anemia in neoplastic disease: Secondary | ICD-10-CM | POA: Diagnosis not present

## 2023-11-01 DIAGNOSIS — C519 Malignant neoplasm of vulva, unspecified: Secondary | ICD-10-CM | POA: Diagnosis not present

## 2023-11-01 DIAGNOSIS — A419 Sepsis, unspecified organism: Secondary | ICD-10-CM | POA: Diagnosis not present

## 2023-11-01 DIAGNOSIS — L03315 Cellulitis of perineum: Secondary | ICD-10-CM | POA: Diagnosis not present

## 2023-11-01 DIAGNOSIS — B029 Zoster without complications: Secondary | ICD-10-CM | POA: Diagnosis not present

## 2023-11-02 ENCOUNTER — Ambulatory Visit

## 2023-11-02 ENCOUNTER — Ambulatory Visit: Admitting: Oncology

## 2023-11-02 ENCOUNTER — Other Ambulatory Visit

## 2023-11-02 DIAGNOSIS — M810 Age-related osteoporosis without current pathological fracture: Secondary | ICD-10-CM | POA: Diagnosis not present

## 2023-11-02 DIAGNOSIS — G4733 Obstructive sleep apnea (adult) (pediatric): Secondary | ICD-10-CM | POA: Diagnosis not present

## 2023-11-02 DIAGNOSIS — E860 Dehydration: Secondary | ICD-10-CM | POA: Diagnosis not present

## 2023-11-02 DIAGNOSIS — F411 Generalized anxiety disorder: Secondary | ICD-10-CM | POA: Diagnosis not present

## 2023-11-02 DIAGNOSIS — R197 Diarrhea, unspecified: Secondary | ICD-10-CM | POA: Diagnosis not present

## 2023-11-02 DIAGNOSIS — N3289 Other specified disorders of bladder: Secondary | ICD-10-CM | POA: Diagnosis not present

## 2023-11-02 DIAGNOSIS — L03315 Cellulitis of perineum: Secondary | ICD-10-CM | POA: Diagnosis not present

## 2023-11-02 DIAGNOSIS — D696 Thrombocytopenia, unspecified: Secondary | ICD-10-CM | POA: Diagnosis not present

## 2023-11-02 DIAGNOSIS — A419 Sepsis, unspecified organism: Secondary | ICD-10-CM | POA: Diagnosis not present

## 2023-11-02 DIAGNOSIS — R7303 Prediabetes: Secondary | ICD-10-CM | POA: Diagnosis not present

## 2023-11-02 DIAGNOSIS — K589 Irritable bowel syndrome without diarrhea: Secondary | ICD-10-CM | POA: Diagnosis not present

## 2023-11-02 DIAGNOSIS — D84821 Immunodeficiency due to drugs: Secondary | ICD-10-CM | POA: Diagnosis not present

## 2023-11-02 DIAGNOSIS — C519 Malignant neoplasm of vulva, unspecified: Secondary | ICD-10-CM | POA: Diagnosis not present

## 2023-11-02 DIAGNOSIS — M797 Fibromyalgia: Secondary | ICD-10-CM | POA: Diagnosis not present

## 2023-11-02 DIAGNOSIS — D630 Anemia in neoplastic disease: Secondary | ICD-10-CM | POA: Diagnosis not present

## 2023-11-02 DIAGNOSIS — B029 Zoster without complications: Secondary | ICD-10-CM | POA: Diagnosis not present

## 2023-11-02 DIAGNOSIS — K5790 Diverticulosis of intestine, part unspecified, without perforation or abscess without bleeding: Secondary | ICD-10-CM | POA: Diagnosis not present

## 2023-11-02 DIAGNOSIS — F5101 Primary insomnia: Secondary | ICD-10-CM | POA: Diagnosis not present

## 2023-11-02 DIAGNOSIS — G4761 Periodic limb movement disorder: Secondary | ICD-10-CM | POA: Diagnosis not present

## 2023-11-02 DIAGNOSIS — F32A Depression, unspecified: Secondary | ICD-10-CM | POA: Diagnosis not present

## 2023-11-02 DIAGNOSIS — K219 Gastro-esophageal reflux disease without esophagitis: Secondary | ICD-10-CM | POA: Diagnosis not present

## 2023-11-02 DIAGNOSIS — G2581 Restless legs syndrome: Secondary | ICD-10-CM | POA: Diagnosis not present

## 2023-11-02 DIAGNOSIS — T451X5D Adverse effect of antineoplastic and immunosuppressive drugs, subsequent encounter: Secondary | ICD-10-CM | POA: Diagnosis not present

## 2023-11-02 DIAGNOSIS — I10 Essential (primary) hypertension: Secondary | ICD-10-CM | POA: Diagnosis not present

## 2023-11-02 DIAGNOSIS — R112 Nausea with vomiting, unspecified: Secondary | ICD-10-CM | POA: Diagnosis not present

## 2023-11-03 ENCOUNTER — Ambulatory Visit

## 2023-11-03 DIAGNOSIS — B029 Zoster without complications: Secondary | ICD-10-CM | POA: Diagnosis not present

## 2023-11-03 DIAGNOSIS — A419 Sepsis, unspecified organism: Secondary | ICD-10-CM | POA: Diagnosis not present

## 2023-11-03 DIAGNOSIS — I10 Essential (primary) hypertension: Secondary | ICD-10-CM | POA: Diagnosis not present

## 2023-11-03 DIAGNOSIS — L03315 Cellulitis of perineum: Secondary | ICD-10-CM | POA: Diagnosis not present

## 2023-11-03 DIAGNOSIS — D630 Anemia in neoplastic disease: Secondary | ICD-10-CM | POA: Diagnosis not present

## 2023-11-03 DIAGNOSIS — C519 Malignant neoplasm of vulva, unspecified: Secondary | ICD-10-CM | POA: Diagnosis not present

## 2023-11-06 ENCOUNTER — Ambulatory Visit

## 2023-11-06 DIAGNOSIS — B029 Zoster without complications: Secondary | ICD-10-CM | POA: Diagnosis not present

## 2023-11-06 DIAGNOSIS — I10 Essential (primary) hypertension: Secondary | ICD-10-CM | POA: Diagnosis not present

## 2023-11-06 DIAGNOSIS — C519 Malignant neoplasm of vulva, unspecified: Secondary | ICD-10-CM | POA: Diagnosis not present

## 2023-11-06 DIAGNOSIS — A419 Sepsis, unspecified organism: Secondary | ICD-10-CM | POA: Diagnosis not present

## 2023-11-06 DIAGNOSIS — D630 Anemia in neoplastic disease: Secondary | ICD-10-CM | POA: Diagnosis not present

## 2023-11-06 DIAGNOSIS — L03315 Cellulitis of perineum: Secondary | ICD-10-CM | POA: Diagnosis not present

## 2023-11-07 ENCOUNTER — Other Ambulatory Visit: Payer: Self-pay | Admitting: Oncology

## 2023-11-07 ENCOUNTER — Ambulatory Visit

## 2023-11-07 DIAGNOSIS — C519 Malignant neoplasm of vulva, unspecified: Secondary | ICD-10-CM

## 2023-11-08 ENCOUNTER — Ambulatory Visit

## 2023-11-09 ENCOUNTER — Ambulatory Visit

## 2023-11-09 DIAGNOSIS — A419 Sepsis, unspecified organism: Secondary | ICD-10-CM | POA: Diagnosis not present

## 2023-11-09 DIAGNOSIS — L03315 Cellulitis of perineum: Secondary | ICD-10-CM | POA: Diagnosis not present

## 2023-11-09 DIAGNOSIS — C519 Malignant neoplasm of vulva, unspecified: Secondary | ICD-10-CM | POA: Diagnosis not present

## 2023-11-09 DIAGNOSIS — B029 Zoster without complications: Secondary | ICD-10-CM | POA: Diagnosis not present

## 2023-11-09 DIAGNOSIS — I10 Essential (primary) hypertension: Secondary | ICD-10-CM | POA: Diagnosis not present

## 2023-11-09 DIAGNOSIS — D630 Anemia in neoplastic disease: Secondary | ICD-10-CM | POA: Diagnosis not present

## 2023-11-10 ENCOUNTER — Ambulatory Visit

## 2023-11-11 ENCOUNTER — Encounter: Payer: Self-pay | Admitting: Oncology

## 2023-11-13 ENCOUNTER — Ambulatory Visit

## 2023-11-14 ENCOUNTER — Inpatient Hospital Stay

## 2023-11-14 ENCOUNTER — Ambulatory Visit

## 2023-11-14 ENCOUNTER — Other Ambulatory Visit: Payer: Self-pay

## 2023-11-14 ENCOUNTER — Other Ambulatory Visit: Payer: Self-pay | Admitting: Hematology and Oncology

## 2023-11-14 ENCOUNTER — Telehealth: Payer: Self-pay | Admitting: Hematology and Oncology

## 2023-11-14 ENCOUNTER — Inpatient Hospital Stay (HOSPITAL_BASED_OUTPATIENT_CLINIC_OR_DEPARTMENT_OTHER): Admitting: Hematology and Oncology

## 2023-11-14 VITALS — BP 134/69 | HR 54 | Temp 98.2°F | Resp 20 | Ht 63.0 in | Wt 210.3 lb

## 2023-11-14 DIAGNOSIS — C519 Malignant neoplasm of vulva, unspecified: Secondary | ICD-10-CM | POA: Diagnosis not present

## 2023-11-14 DIAGNOSIS — Z170 Estrogen receptor positive status [ER+]: Secondary | ICD-10-CM | POA: Diagnosis not present

## 2023-11-14 DIAGNOSIS — C50811 Malignant neoplasm of overlapping sites of right female breast: Secondary | ICD-10-CM | POA: Diagnosis not present

## 2023-11-14 DIAGNOSIS — E860 Dehydration: Secondary | ICD-10-CM

## 2023-11-14 DIAGNOSIS — L03311 Cellulitis of abdominal wall: Secondary | ICD-10-CM | POA: Diagnosis not present

## 2023-11-14 DIAGNOSIS — Z1721 Progesterone receptor positive status: Secondary | ICD-10-CM | POA: Diagnosis not present

## 2023-11-14 DIAGNOSIS — Z1732 Human epidermal growth factor receptor 2 negative status: Secondary | ICD-10-CM | POA: Diagnosis not present

## 2023-11-14 LAB — CMP (CANCER CENTER ONLY)
ALT: 24 U/L (ref 0–44)
AST: 21 U/L (ref 15–41)
Albumin: 4.1 g/dL (ref 3.5–5.0)
Alkaline Phosphatase: 64 U/L (ref 38–126)
Anion gap: 11 (ref 5–15)
BUN: 22 mg/dL (ref 8–23)
CO2: 23 mmol/L (ref 22–32)
Calcium: 9.4 mg/dL (ref 8.9–10.3)
Chloride: 105 mmol/L (ref 98–111)
Creatinine: 1.04 mg/dL — ABNORMAL HIGH (ref 0.44–1.00)
GFR, Estimated: 56 mL/min — ABNORMAL LOW (ref >60)
Glucose, Bld: 97 mg/dL (ref 70–99)
Potassium: 4.1 mmol/L (ref 3.5–5.1)
Sodium: 139 mmol/L (ref 135–145)
Total Bilirubin: 0.2 mg/dL (ref 0.0–1.2)
Total Protein: 6.2 g/dL — ABNORMAL LOW (ref 6.5–8.1)

## 2023-11-14 LAB — CBC WITH DIFFERENTIAL (CANCER CENTER ONLY)
Abs Immature Granulocytes: 0.02 K/uL (ref 0.00–0.07)
Basophils Absolute: 0 K/uL (ref 0.0–0.1)
Basophils Relative: 1 %
Eosinophils Absolute: 0.3 K/uL (ref 0.0–0.5)
Eosinophils Relative: 7 %
HCT: 29.1 % — ABNORMAL LOW (ref 36.0–46.0)
Hemoglobin: 9.5 g/dL — ABNORMAL LOW (ref 12.0–15.0)
Immature Granulocytes: 1 %
Lymphocytes Relative: 19 %
Lymphs Abs: 0.7 K/uL (ref 0.7–4.0)
MCH: 29.1 pg (ref 26.0–34.0)
MCHC: 32.6 g/dL (ref 30.0–36.0)
MCV: 89.3 fL (ref 80.0–100.0)
Monocytes Absolute: 0.6 K/uL (ref 0.1–1.0)
Monocytes Relative: 16 %
Neutro Abs: 2.2 K/uL (ref 1.7–7.7)
Neutrophils Relative %: 56 %
Platelet Count: 171 K/uL (ref 150–400)
RBC: 3.26 MIL/uL — ABNORMAL LOW (ref 3.87–5.11)
RDW: 16.6 % — ABNORMAL HIGH (ref 11.5–15.5)
WBC Count: 3.9 K/uL — ABNORMAL LOW (ref 4.0–10.5)
nRBC: 0 % (ref 0.0–0.2)

## 2023-11-14 LAB — MAGNESIUM: Magnesium: 2 mg/dL (ref 1.7–2.4)

## 2023-11-14 NOTE — Progress Notes (Signed)
 [facility]  [address] [place],  [place]  [address] [phone]  Clinic Day:  10/31/23  Referring physician: [name], MD  ASSESSMENT & PLAN:  Assessment: Invasive lobular carcinoma This was found on screening mammogram but the MRI reveals focal enhancement surrounding the biopsy clip up to 2.3 cm and there was non-mass enhancement posterior to this known malignancy on MRI.  This area was biopsied in order to guide her ultimate surgery.This is strongly ER/PR positive, HER2 negative and has a low Ki 67 of less than 5%.  She has now had a lumpectomy but had to go back for reexcision of the margins.  The sentinel lymph node was negative but she did have 2 primaries, measuring 2.4 cm and 2.2 cm, for a T2 N0 M0, stage IIA.  Even though she has several favorable characteristics, such as grade 1 histology and a low Ki 67, I still recommended that we pursue Endopredict testing to quantitate her risk for recurrence, and fortunately she has an EpClin score of 2.6, low risk.  This correlates with a 5.1% risk of distant recurrence in the next 10 years. Her benefit from chemotherapy would be 1.0% so we did not pursue. Her risk for a late recurrence is 4.0%. Unfortunately she has refused hormonal therapy.    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  She was treated with pelvic vulvectomy, bilateral debulking, radical lymphadenectomy, and inguinal dissection in April 2025.  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  There was lymphovascular invasion and carcinoma in situ at the 6 o'clock margin with < 1 mm margin for invasive carcinoma. PET scan revealed metastatic left inguinal lymph nodes up to 1.8 cm in diameter, but no additional evidence of metastatic disease  in the neck, chest, abdomen or pelvis. There was a 4 mm right lower lobe nodule, too small for PET resolution.    She was receiving concurrent chemoradiation with weekly cisplatin , as well as pembrolizuab every 3 weeks since her PDL 1 score is 85%, followed by maintenance pembrolizumab .  She had difficulty tolerating treatment due to nausea, vomiting and diarrhea, as well as hypomagnesemia.  We gave her additional IV fluids weekly and IV magnesium  as needed.  She developed mild pancytopenia felt to be most likely due to treatment.  After her third cycle of weekly cisplatin , she was hospitalized with sepsis due to recurrent cellulitis of the left vulva and thigh.  She then developed shingles of the left thigh.  She had pancytopenia at least in part due to chemotherapy.  At discharge on July 4, WBCs were 2.5, hemoglobin 8.7 and platelets 142,000. She was discharged on doxycycline and valacyclovir.  Due to the multiple issues with chemotherapy and radiation, she has declined taking any further therapy. She had about half of her treatment. I offered her continuation of immunotherapy but she declines that as well.    Osteopenia Her spine shows a T score of -2.6 for osteoporosis and the right femur has osteopenia.  At the very least she should be taking calcium  and vitamin D. Bone density scan done on 07/17/2023 revealed osteopenia.   Strong family history Including multiple females with breast cancer, her mother had colon cancer at age 64, and another maternal aunt had ovarian cancer.  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  She has been counseled on the implications of this and is already getting regular colonoscopy.  However the information will also be passed on to family members to pursue testing.   Plan: She presents to clinic today for follow up symptom management. She developed cellulitis of the left vulva that spread down to her thigh and became septic.  Cellulitis remains, although much less symptomatic. She notes feeling much better. CBC and CMP are unremarkable today. She will not require, nor does she want to have IVF today.  We will continue supportive care and I will see her back in 2 weeks with CBC, CMP, and magnesium . The patient understands the plans discussed today and is in agreement with them.  She knows to contact our office if she develops concerns prior to her next appointment.  I provided 20 minutes of face-to-face time during this encounter and > 50% was spent counseling as documented under my assessment and plan.   Doris Bach, FNP - Board Certified  [facility] - A DEPT OF [facility] [address] [place] [address] Dept: [phone] Dept Fax: [phone]   No orders of the defined types were placed in this encounter.   CHIEF COMPLAINT:  CC: Stage IIA Breast cancer  Current Treatment: IV ceftriaxone   HISTORY OF PRESENT ILLNESS:  Doris Reyes 73 y.o. 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  She does have annual mammograms because of her strong family history, but was on hormone replacement therapy for 10 years.  She had a screening mammogram at [facility] on May 05, 2021 which revealed architectural distortion which was felt to be indeterminant.  She had a diagnostic mammogram on March 3 which showed mild nipple retraction and a 1 cm area of focal asymmetry in the retroareolar region.  Ultrasound revealed a hypoechoic irregular mass measuring 9 mm in that area.  On March 14 she had a biopsy performed which revealed a 0.9 cm irregular mass with spiculated margins in the right breast at 12:00, approximately 2 cm from the nipple and anteriorly in the breast.  This was found to be grade 2 invasive mammary carcinoma as well as mammary carcinoma in situ.  The E-cadherin  immunohistochemistry stains were negative and so this is consistent with invasive lobular carcinoma.  The estrogen receptors were positive at 95% and progesterone receptors positive at 95% with HER2 by immunohistochemistry positive at 2+ but FISH was negative.  Ki-67 was less than 5%.  The carcinoma in situ was positive for E-cadherin, consistent with ductal carcinoma in situ.  She had an MRI of the breast and was found to have focal enhancement in the right breast surrounding the biopsy clip and some non-mass enhancement posterior to the malignancy.  It was recommended that she have an MRI guided biopsy of the clumped non-mass enhancement area and this revealed invasive lobular carcinoma grade 1 with lobular carcinoma in situ and extensive fibrocystic changes with focal intraluminal microcalcification and intraductal papillomatosis.   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  It was recommended that she have colonoscopy beginning at age 62 or 10 years prior to the age of her first-degree use relatives age at colorectal cancer diagnosis.  It was recommended this be repeated every 5 years.  This was explained to her.   She has now had a lumpectomy but had to go back for reexcision of the margins.  The sentinel lymph node was negative but she did have 2 primaries, measuring 2.4 cm and 2.2 cm, for a T2 N0 M0, stage IIA. This was ER/PR positive, HER 2 negative and a Ki-67 was 5%.  Even though she has several favorable characteristics, such as grade 1 histology and a low Ki 67, I still recommended that we pursue Endopredict testing to quantitate her risk for recurrence, and  fortunately she has an EpClin score of 2.6, low risk. This correlates with a 5.1% risk of distant recurrence in the next 10 years, with only a 1.0% benefit of chemotherapy. Her risk of late recurrence is 4.0%. I recommended hormonal therapy but she refused.   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 PET scan revealed metastatic left inguinal lymph nodes up to 1.8 cm in diameter, but no additional evidence of metastatic disease in the neck, chest, abdomen or pelvis. There was a 4 mm right lower lobe nodule, too small for PET resolution.  Patient had a pelvic vulvectomy, bilateral debulking, radical lymphadenectomy, and inguinal dissection done on 06/28/2023.  Pathology revealed 3/4 positive nodes on the left side, up to 3 cm and with extensive extranodal tumor, and negative nodes on the right side. The vulvar carcinoma measured 3 cm with depth of invasion 5 mm and carcinoma in situ, for a T1b N2c M0. She had lymphovascular invasion and carcinoma in situ at the 6 o'clock margin with < 1 mm margin for invasive carcinoma.Diagnostic bilateral mammogram done on 05/04/2023 was clear. Bone density scan done on 07/17/2023 revealed osteopenia.  Oncology History  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)  06/08/2021 Initial Diagnosis   Breast cancer in female [facility])   07/23/2021 Genetic Testing   Negative hereditary cancer genetic testing: no pathogenic variants detected in [facility]CAPl[facility] Panel. Report date is July 23, 2021.  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  The report date is Jul 26, 2021.  The BRCAplus panel offered by [employer] and includes sequencing and deletion/duplication analysis for the following 8 genes: ATM, BRCA1, BRCA2, CDH1, CHEK2, PALB2, PTEN, and TP53.  Results of pan-cancer panel pending.    The CancerNext-Expanded gene panel offered by [facility] and includes sequencing and rearrangement analysis for the following 77 genes: AIP, ALK, APC*, ATM*, AXIN2, BAP1, BARD1, BLM, BMPR1A, BRCA1*, BRCA2*, BRIP1*, CDC73, CDH1*, CDK4, CDKN1B, CDKN2A, CHEK2*, CTNNA1, DICER1, FANCC, FH, FLCN, GALNT12, KIF1B, LZTR1, MAX, MEN1, MET, MLH1*, MSH2*, MSH3, MSH6*, MUTYH*, NBN, NF1*, NF2, NTHL1, PALB2*, PHOX2B, PMS2*, POT1, PRKAR1A, PTCH1, PTEN*, RAD51C*, RAD51D*, RB1, RECQL, RET, SDHA, SDHAF2, SDHB, SDHC, SDHD, SMAD4, SMARCA4, SMARCB1, SMARCE1, STK11, SUFU, TMEM127, TP53*, TSC1, TSC2, VHL and XRCC2 (sequencing and deletion/duplication); EGFR, EGLN1, HOXB13, KIT, MITF, PDGFRA, POLD1, and POLE (sequencing only); EPCAM and GREM1 (deletion/duplication only). DNA and RNA analyses performed for * genes.    08/23/2021 Cancer Staging   Staging form: Breast, AJCC 8th Edition - Pathologic stage  from 08/23/2021: Stage IA (pT2(2), pN0(sn), cM0, G1, ER+, PR+, HER2-) - Signed by Cornelius Wanda DEL, MD on 09/29/2021 Histopathologic type: Lobular carcinoma, NOS Stage prefix: Initial diagnosis Method of lymph node assessment: Sentinel lymph node biopsy Nuclear grade: G1 Multigene prognostic tests performed: EndoPredict Histologic grading system: 3 grade system Residual tumor (R): R0 - None Laterality: Right Tumor size (mm): 24 Multiple tumors: Yes Number of tumors: 2 Lymph-vascular invasion (LVI): LVI not present (absent)/not identified Diagnostic confirmation: Positive histology PLUS positive immunophenotyping and/or positive genetic studies Specimen type: Excision Staged by: Managing physician Menopausal status: Postmenopausal Ki-67 (%): 5 Stage used in treatment planning: Yes National guidelines used in treatment planning: Yes Type of national guideline used in treatment planning: NCCN   Vulvar cancer (HCC)  06/28/2023 Initial Diagnosis   Vulvar cancer (HCC)   06/28/2023 Cancer Staging   Staging form:  Vulva, AJCC V9 - Clinical stage from 06/28/2023: FIGO Stage IIIC (cT1b, cN1c, cM0) - Signed by Cornelius Wanda DEL, MD on 07/31/2023 Histopathologic type: Squamous cell carcinoma, NOS Stage prefix: Initial diagnosis Method of lymph node assessment: Lymph node dissection Histologic grade (G): G2 Histologic grading system: 3 grade system Tumor size (mm): 30 Lymph-vascular invasion (LVI): LVI present/identified, NOS Diagnostic confirmation: Positive histology Specimen type: Excision Staged by: Managing physician Femoral-inguinal nodal status: Positive Solitary (s) or multifocal (m) tumors in the primary site: Solitary Perineural invasion (PNI): Unknown Stage used in treatment planning: Yes National guidelines used in treatment planning: Yes Type of national guideline used in treatment planning: NCCN   09/04/2023 - 09/18/2023 Chemotherapy   Patient is on Treatment Plan : CERVICAL Pembrolizumab  q21d + XRT (cisplatin  d/c'd 09/25/23)     12/22/2023 - 12/22/2023 Chemotherapy   Patient is on Treatment Plan : CERVICAL Pembrolizumab  (200) q21d      INTERVAL HISTORY:  Doris Reyes is here today for repeat clinical assessment for her Breast cancer, stage IIA breast cancer. Patient states that she feels well and has no complaint of pain. She has 2 weeks left of her Keflex .  During physical exam I noticed a dark keratosis on her right anterior lower tibia. Patient states that this has been there for years and has gradually gotten larger. I recommended she go to dermatology at some point to further evaluate this. We will also have her reevaluated by Dr. Viktoria gyn-oncology in the Fall. She has a WBC of 5.0, low hemoglobin of 9.5, and platelet count of 317,000. Her CMP is fairly normal other than a mildly low total protein of 6.4 and elevated creatinine of 1.06. Her magnesium  has returned to normal from 1.5 to 2.0. We re-discussed the option of immunotherapy and patient states that she declines to take any further  therapy. We will continue supportive care and I will see her back in 2 weeks with CBC, CMP, and magnesium .    She denies fever, chills, night sweats, or other signs of infection. She denies cardiorespiratory and gastrointestinal issues. She  denies pain. Her appetite is good and Her weight has been stable.    REVIEW OF SYSTEMS:   Review of Systems  Constitutional:  Negative for appetite change, chills, diaphoresis, fatigue, fever and unexpected weight change.  HENT:  Negative.  Negative for lump/mass, mouth sores and sore throat.   Eyes:  Positive for eye problems.  Respiratory: Negative.  Negative for chest tightness, cough, hemoptysis, shortness of breath and wheezing.   Cardiovascular: Negative.  Negative for chest pain, leg swelling and palpitations.  Gastrointestinal: Negative.  Negative for abdominal  distention, abdominal pain, blood in stool, constipation, diarrhea, nausea and vomiting.  Endocrine: Negative.   Genitourinary: Negative.  Negative for difficulty urinating, dysuria, frequency, hematuria, vaginal bleeding and vaginal discharge.        Mild erythema of the genitalia   Musculoskeletal:  Negative for arthralgias, back pain, flank pain, gait problem and myalgias.  Skin: Negative.  Negative for rash.  Neurological:  Negative for dizziness, extremity weakness, gait problem, headaches, light-headedness, numbness, seizures and speech difficulty.  Hematological: Negative.  Negative for adenopathy. Does not bruise/bleed easily.  Psychiatric/Behavioral: Negative.  Negative for depression and sleep disturbance. The patient is not nervous/anxious.    VITALS:   Vitals:   11/14/23 1033  BP: 134/69  Pulse: (!) 54  Resp: 20  Temp: 98.2 F (36.8 C)  SpO2: 100%   Wt Readings from Last 3 Encounters:  11/14/23 210 lb 4.8 oz (95.4 kg)  10/31/23 208 lb 11.2 oz (94.7 kg)  10/25/23 208 lb 6.4 oz (94.5 kg)    Body mass index is 37.25 kg/m.  Performance status (ECOG): 1 -  Symptomatic but completely ambulatory  PHYSICAL EXAM:  Physical Exam Vitals and nursing note reviewed.  Constitutional:      General: She is not in acute distress.    Appearance: Normal appearance. She is normal weight. She is not ill-appearing.  HENT:     Head: Normocephalic and atraumatic.     Right Ear: Tympanic membrane, ear canal and external ear normal. There is no impacted cerumen.     Left Ear: Tympanic membrane, ear canal and external ear normal. There is no impacted cerumen.     Nose: Nose normal. No congestion or rhinorrhea.     Mouth/Throat:     Mouth: Mucous membranes are moist.     Pharynx: Oropharynx is clear. No oropharyngeal exudate or posterior oropharyngeal erythema.  Eyes:     General: No scleral icterus.       Right eye: No discharge.        Left eye: No discharge.     Extraocular Movements: Extraocular movements intact.     Conjunctiva/sclera: Conjunctivae normal.     Pupils: Pupils are equal, round, and reactive to light.  Cardiovascular:     Rate and Rhythm: Normal rate and regular rhythm.     Pulses: Normal pulses.     Heart sounds: Normal heart sounds. No murmur heard.    No friction rub. No gallop.  Pulmonary:     Effort: Pulmonary effort is normal. No respiratory distress.     Breath sounds: Normal breath sounds. No stridor. No wheezing, rhonchi or rales.  Abdominal:     General: Bowel sounds are normal. There is no distension.     Palpations: Abdomen is soft. There is no hepatomegaly, splenomegaly or mass.     Tenderness: There is no abdominal tenderness. There is no right CVA tenderness, left CVA tenderness, guarding or rebound.     Hernia: No hernia is present.  Genitourinary:    Comments: Genitalia has some mild pink coloration No exudate Musculoskeletal:        General: Normal range of motion.     Cervical back: Normal range of motion and neck supple. No tenderness.     Right lower leg: No edema.     Left lower leg: No edema.   Lymphadenopathy:     Cervical: No cervical adenopathy.     Right cervical: No superficial, deep or posterior cervical adenopathy.    Left cervical: No superficial,  deep or posterior cervical adenopathy.     Upper Body:     Right upper body: No supraclavicular, axillary or pectoral adenopathy.     Left upper body: No supraclavicular, axillary or pectoral adenopathy.  Skin:    General: Skin is warm and dry.     Coloration: Skin is not jaundiced.     Findings: Lesion present. No erythema or rash.     Comments: Right anterior lower tibia has a crusted keratotic lesion of the skin, which she says may be enlarging  Neurological:     General: No focal deficit present.     Mental Status: She is alert and oriented to person, place, and time. Mental status is at baseline.     Cranial Nerves: No cranial nerve deficit.  Psychiatric:        Mood and Affect: Mood normal.        Behavior: Behavior normal.        Thought Content: Thought content normal.        Judgment: Judgment normal.    LABS:      Latest Ref Rng & Units 11/14/2023   10:09 AM 10/31/2023    9:43 AM 10/25/2023   10:26 AM  CBC  WBC 4.0 - 10.5 K/uL 3.9  5.0  8.3   Hemoglobin 12.0 - 15.0 g/dL 9.5  9.5  9.7   Hematocrit 36.0 - 46.0 % 29.1  28.8  29.1   Platelets 150 - 400 K/uL 171  217  159       Latest Ref Rng & Units 10/31/2023    9:43 AM 10/25/2023   10:26 AM 10/18/2023   10:52 AM  CMP  Glucose 70 - 99 mg/dL 894  869  898   BUN 8 - 23 mg/dL 19  21  25    Creatinine 0.44 - 1.00 mg/dL 8.93  8.97  8.93   Sodium 135 - 145 mmol/L 140  134  140   Potassium 3.5 - 5.1 mmol/L 4.5  4.0  4.3   Chloride 98 - 111 mmol/L 105  99  104   CO2 22 - 32 mmol/L 25  22  24    Calcium  8.9 - 10.3 mg/dL 9.6  9.3  9.6   Total Protein 6.5 - 8.1 g/dL 6.4  6.4  6.4   Total Bilirubin 0.0 - 1.2 mg/dL 0.2  0.4  0.3   Alkaline Phos 38 - 126 U/L 80  85  83   AST 15 - 41 U/L 24  18  18    ALT 0 - 44 U/L 25  19  17     Lab Results  Component Value Date    TSH 0.815 10/25/2023   T4TOTAL 7.9 10/25/2023   Lab Results  Component Value Date   TIBC 441 10/11/2023   FERRITIN 65 10/11/2023   IRONPCTSAT 14 10/11/2023     STUDIES:  EXAM: 07/17/2023 DUAL X-RAY ABSORPTIOMETRY (DXA) FOR BONE MINERAL DENSITY LUMBAR SPINE (L2-L4): BMD (in g/cm2): 0.912 T-score: -2.4 Z-score: -0.7   LEFT FEMORAL NECK: BMD (in g/cm2): 0.793 T-score: -1.8 Z-score: 0.0   LEFT TOTAL HIP: BMD (in g/cm2): 0.922 T-score: -0.7 Z-score: 0.9   RIGHT FEMORAL NECK: BMD (in g/cm2): 0.855 T-score: -1.3 Z-score: 0.5   RIGHT TOTAL HIP: BMD (in g/cm2): 0.896 T-score: -0.9 Z-score: 0.7   EXAM: 06/05/2023 NUCLEAR MEDICINE PET SKULL BASE TO THIGH IMPRESSION: 1. Metastatic left inguinal lymph nodes. No additional evidence of metastatic disease in the neck, chest, abdomen or pelvis. 2. 4  mm right lower lobe nodule, too small for PET resolution. Recommend attention on follow-up. 3. Small to moderate hiatal hernia. 4.  Aortic atherosclerosis (ICD10-I70.0).  HISTORY:   Past Medical History:  Diagnosis Date   Anemia    Anxiety    Cancer (HCC)    right breast ILC   Complication of anesthesia    difficulty waking up   Depression    Diverticulitis    Family history of breast cancer    Family history of ovarian cancer    Family history of prostate cancer    Fibromyalgia    GERD (gastroesophageal reflux disease)    Heart murmur    History of colonic polyps    History of hiatal hernia    Hyperlipidemia    Hypertension    IBS (irritable bowel syndrome)    Ischemic colitis (HCC) 03/2012   Osteoporosis    Pre-diabetes    RLS (restless legs syndrome)    Sinusitis    Sleep apnea    CPAP nightly    Past Surgical History:  Procedure Laterality Date   BREAST LUMPECTOMY WITH RADIOACTIVE SEED LOCALIZATION Right 08/04/2021   Procedure: RIGHT BREAST LUMPECTOMY WITH RADIOACTIVE SEED LOCALIZATION;  Surgeon: Vanderbilt Ned, MD;  Location: [facility];  Service:  General;  Laterality: Right;   CHOLECYSTECTOMY  2004   COLONOSCOPY WITH PROPOFOL  N/A 04/18/2017   Procedure: COLONOSCOPY WITH PROPOFOL ;  Surgeon: Kristie Lamprey, MD;  Location: [facility];  Service: Endoscopy;  Laterality: N/A;   ESOPHAGOGASTRODUODENOSCOPY (EGD) WITH PROPOFOL  N/A 04/18/2017   Procedure: ESOPHAGOGASTRODUODENOSCOPY (EGD) WITH PROPOFOL ;  Surgeon: Kristie Lamprey, MD;  Location: [facility];  Service: Endoscopy;  Laterality: N/A;   EXAM UNDER ANESTHESIA, PELVIC N/A 06/28/2023   Procedure: EXAM UNDER ANESTHESIA, PELVIC;  Surgeon: Viktoria Comer SAUNDERS, MD;  Location: [facility];  Service: Gynecology;  Laterality: N/A;   FLEXIBLE SIGMOIDOSCOPY  04/20/2012   Procedure: FLEXIBLE SIGMOIDOSCOPY;  Surgeon: Belvie JONETTA Just, MD;  Location: [facility];  Service: Endoscopy;  Laterality: N/A;   INGUINAL LYMPHADENECTOMY N/A 06/28/2023   Procedure: LYMPHADENECTOMY, INGUINAL, OPEN;  Surgeon: Viktoria Comer SAUNDERS, MD;  Location: [facility];  Service: Gynecology;  Laterality: N/A;   IR IMAGING GUIDED PORT INSERTION  07/31/2023   NISSEN FUNDOPLICATION  2011   PARAESOPHAGEAL HERNIA REPAIR  09/11/2009   and Nissen fundoplication   RADICAL VULVECTOMY N/A 06/28/2023   Procedure: VULVECTOMY, RADICAL;  Surgeon: Viktoria Comer SAUNDERS, MD;  Location: [facility];  Service: Gynecology;  Laterality: N/A;  Possible skin flap   RE-EXCISION OF BREAST LUMPECTOMY Right 08/18/2021   Procedure: RE-EXCISION RIGHT BREAST LUMPECTOMY;  Surgeon: Vanderbilt Ned, MD;  Location: [facility];  Service: General;  Laterality: Right;   SENTINEL NODE BIOPSY N/A 08/04/2021   Procedure: SENTINEL NODE BIOPSY;  Surgeon: Vanderbilt Ned, MD;  Location: [facility];  Service: General;  Laterality: N/A;   TONSILLECTOMY  1960's   TOTAL ABDOMINAL HYSTERECTOMY  2001   w/ BSO    Family History  Problem Relation Age of Onset   Colon cancer Mother 41   Alzheimer's disease Mother    Heart attack Father    Cancer Maternal Aunt        x2    Alzheimer's disease Maternal Aunt    Ovarian cancer Maternal Aunt    Prostate cancer Maternal Uncle    Kidney disease Maternal Uncle    Atrial fibrillation Maternal Uncle    Stroke Paternal Uncle    Aneurysm Paternal Grandmother  brain   Stroke Paternal Grandfather    Prostate cancer Cousin        paternal first cousin   Prostate cancer Cousin        paternal first cousin   Esophageal cancer Cousin        maternal first cousin   Breast cancer Cousin        DCIS, maternal first cousin   Ovarian cancer Cousin        maternal first cousin   Rheum arthritis Other    Lung disease Neg Hx     Social History:  reports that she has never smoked. She has never used smokeless tobacco. She reports that she does not drink alcohol and does not use drugs.The patient is alone today.  Allergies:  Allergies  Allergen Reactions   Cefuroxime Other (See Comments), Diarrhea and Nausea Only    IBS   Sulfa  Antibiotics Nausea And Vomiting and Nausea Only   Tramadol Other (See Comments) and Nausea Only   Ceftin [Cefuroxime Axetil] Diarrhea   Compazine  [Prochlorperazine ] Other (See Comments)    Vision problems   Ondansetron  Hcl    Other Other (See Comments)    Pneumonia vaccine (uncoded)   Tramadol Hcl Nausea And Vomiting   Zofran  [Ondansetron ] Other (See Comments)    Vision changes    Current Medications: Current Outpatient Medications  Medication Sig Dispense Refill   acetaminophen  (TYLENOL ) 650 MG CR tablet 1,300 mg in the morning and at bedtime.     amLODipine  (NORVASC ) 5 MG tablet Take 5 mg by mouth daily.     Ascorbic Acid (VITAMIN C) 500 MG CHEW Chew 500 mg by mouth daily.     aspirin EC 81 MG tablet Take 81 mg by mouth at bedtime. Swallow whole.     atenolol  (TENORMIN ) 50 MG tablet Take 75 mg by mouth daily.      benazepril  (LOTENSIN ) 10 MG tablet Take 10 mg by mouth daily.     benzonatate (TESSALON) 100 MG capsule Take 100-200 mg by mouth at bedtime.     Biotin 1000 MCG  tablet Take 1,000 mcg by mouth daily.     Calcium  Citrate-Vitamin D (CITRACAL + D PO) Take 1 tablet by mouth daily.     cetirizine (ZYRTEC) 10 MG tablet Take 10 mg by mouth at bedtime.     Cholecalciferol (VITAMIN D) 50 MCG (2000 UT) tablet Take 2,000 Units by mouth daily.     cyanocobalamin  1000 MCG tablet Take 1,000 mcg by mouth daily.     diclofenac sodium (VOLTAREN) 1 % GEL Apply 1 application  topically daily as needed (for pain).     dicyclomine (BENTYL) 10 MG capsule Take 1 capsule by mouth as needed.     diphenhydrAMINE  (BENADRYL ) 25 MG tablet Take 25 mg by mouth every 6 (six) hours as needed for allergies.     diphenoxylate-atropine (LOMOTIL) 2.5-0.025 MG tablet Take 2 tablets by mouth 4 (four) times daily as needed for diarrhea or loose stools.     DULoxetine  (CYMBALTA ) 60 MG capsule 2 capsules Orally once a day     EPINEPHrine  (EPIPEN  2-PAK) 0.3 mg/0.3 mL IJ SOAJ injection Inject 0.3 mg into the muscle as needed for anaphylaxis.     famotidine  (PEPCID ) 20 MG tablet Take 20 mg by mouth at bedtime.     fexofenadine (ALLEGRA) 180 MG tablet Take 180 mg by mouth in the morning.     fluticasone  (FLONASE ) 50 MCG/ACT nasal spray Place 1 spray into both  nostrils daily.     lidocaine  (XYLOCAINE ) 5 % ointment Apply 1 Application topically 2 (two) times daily as needed for moderate pain (pain score 4-6) (to the vulva). Apply to vulva as needed for discomfort 35.44 g 3   lidocaine  4 % Place 1 patch onto the skin daily.     LORazepam  (ATIVAN ) 1 MG tablet Take 1 tablet (1 mg total) by mouth every 8 (eight) hours. 30 tablet 0   NON FORMULARY Pt uses a c-pap nightly     Nutritional Supplements (JUICE PLUS FIBRE PO) Take 2 each by mouth daily. Fruits and Vegetables     nystatin  (MYCOSTATIN ) 100000 UNIT/ML suspension Take 5 mLs (500,000 Units total) by mouth 4 (four) times daily. 60 mL 2   nystatin  (MYCOSTATIN /NYSTOP ) powder Apply 1 Application topically 3 (three) times daily. 30 g 5   Omega-3 Fatty  Acids (FISH OIL) 1000 MG CAPS Take 1,000 mg by mouth daily.      pantoprazole  (PROTONIX ) 40 MG tablet Take 40 mg by mouth every evening.     Phenylephrine -APAP-Guaifenesin (TYLENOL  SINUS SEVERE PO) Take 2 tablets by mouth daily as needed (drainage).     polyvinyl alcohol (LIQUIFILM TEARS) 1.4 % ophthalmic solution Place 1 drop into both eyes 5 (five) times daily.     Probiotic Product (PROBIOTIC & ACIDOPHILUS EX ST PO) Take 1 capsule by mouth daily. Ultra Flora Plus Capsules     Propylene Glycol (SYSTANE COMPLETE) 0.6 % SOLN Place 1 drop into both eyes in the morning and at bedtime.     rOPINIRole  (REQUIP ) 0.5 MG tablet Take 0.5 mg by mouth at bedtime.     senna-docusate (SENOKOT-S) 8.6-50 MG tablet Take 2 tablets by mouth at bedtime. For AFTER surgery, do not take if having diarrhea 30 tablet 0   simvastatin  (ZOCOR ) 10 MG tablet Take 10 mg by mouth at bedtime.       sucralfate  (CARAFATE ) 1 GM/10ML suspension Take 1 g by mouth 2 (two) times daily.     traZODone  (DESYREL ) 50 MG tablet Take 50 mg by mouth at bedtime.     triamcinolone  (KENALOG ) 0.025 % ointment Apply 1 Application topically 2 (two) times daily. 30 g 0   vitamin E 400 UNIT capsule Take 400 Units by mouth daily.       White Petrolatum -Mineral Oil (REFRESH P.M. OP) Place 1 Application into both eyes at bedtime.     No current facility-administered medications for this visit.      I,Jasmine M Lassiter,acting as a Neurosurgeon for [employer], NP.,have documented all relevant documentation on the behalf of Doris DELENA Bach, NP,as directed by  Doris DELENA Bach, NP while in the presence of Doris DELENA Bach, NP.

## 2023-11-14 NOTE — Telephone Encounter (Signed)
 Patient has been scheduled for follow-up visit per 11/14/23 LOS.  Pt given an appt calendar with date and time.

## 2023-11-15 ENCOUNTER — Ambulatory Visit

## 2023-11-28 ENCOUNTER — Other Ambulatory Visit: Payer: Self-pay

## 2023-11-28 ENCOUNTER — Telehealth: Payer: Self-pay | Admitting: Hematology and Oncology

## 2023-11-28 ENCOUNTER — Inpatient Hospital Stay (HOSPITAL_BASED_OUTPATIENT_CLINIC_OR_DEPARTMENT_OTHER): Admitting: Hematology and Oncology

## 2023-11-28 ENCOUNTER — Inpatient Hospital Stay: Attending: Oncology

## 2023-11-28 VITALS — BP 122/81 | HR 53 | Temp 98.4°F | Resp 16 | Ht 63.0 in | Wt 209.4 lb

## 2023-11-28 DIAGNOSIS — Z803 Family history of malignant neoplasm of breast: Secondary | ICD-10-CM | POA: Diagnosis not present

## 2023-11-28 DIAGNOSIS — Z1502 Genetic susceptibility to malignant neoplasm of ovary: Secondary | ICD-10-CM | POA: Insufficient documentation

## 2023-11-28 DIAGNOSIS — C519 Malignant neoplasm of vulva, unspecified: Secondary | ICD-10-CM | POA: Insufficient documentation

## 2023-11-28 DIAGNOSIS — Z8041 Family history of malignant neoplasm of ovary: Secondary | ICD-10-CM | POA: Insufficient documentation

## 2023-11-28 DIAGNOSIS — Z79899 Other long term (current) drug therapy: Secondary | ICD-10-CM | POA: Insufficient documentation

## 2023-11-28 DIAGNOSIS — Z1509 Genetic susceptibility to other malignant neoplasm: Secondary | ICD-10-CM | POA: Diagnosis not present

## 2023-11-28 DIAGNOSIS — E860 Dehydration: Secondary | ICD-10-CM | POA: Diagnosis not present

## 2023-11-28 DIAGNOSIS — M8589 Other specified disorders of bone density and structure, multiple sites: Secondary | ICD-10-CM | POA: Diagnosis not present

## 2023-11-28 LAB — CBC WITH DIFFERENTIAL (CANCER CENTER ONLY)
Abs Immature Granulocytes: 0.02 K/uL (ref 0.00–0.07)
Basophils Absolute: 0 K/uL (ref 0.0–0.1)
Basophils Relative: 1 %
Eosinophils Absolute: 0.3 K/uL (ref 0.0–0.5)
Eosinophils Relative: 7 %
HCT: 32.7 % — ABNORMAL LOW (ref 36.0–46.0)
Hemoglobin: 10.7 g/dL — ABNORMAL LOW (ref 12.0–15.0)
Immature Granulocytes: 0 %
Lymphocytes Relative: 18 %
Lymphs Abs: 0.8 K/uL (ref 0.7–4.0)
MCH: 29.1 pg (ref 26.0–34.0)
MCHC: 32.7 g/dL (ref 30.0–36.0)
MCV: 88.9 fL (ref 80.0–100.0)
Monocytes Absolute: 0.6 K/uL (ref 0.1–1.0)
Monocytes Relative: 14 %
Neutro Abs: 2.8 K/uL (ref 1.7–7.7)
Neutrophils Relative %: 60 %
Platelet Count: 178 K/uL (ref 150–400)
RBC: 3.68 MIL/uL — ABNORMAL LOW (ref 3.87–5.11)
RDW: 15 % (ref 11.5–15.5)
WBC Count: 4.5 K/uL (ref 4.0–10.5)
nRBC: 0 % (ref 0.0–0.2)

## 2023-11-28 LAB — CMP (CANCER CENTER ONLY)
ALT: 18 U/L (ref 0–44)
AST: 22 U/L (ref 15–41)
Albumin: 4.2 g/dL (ref 3.5–5.0)
Alkaline Phosphatase: 73 U/L (ref 38–126)
Anion gap: 12 (ref 5–15)
BUN: 20 mg/dL (ref 8–23)
CO2: 23 mmol/L (ref 22–32)
Calcium: 9.5 mg/dL (ref 8.9–10.3)
Chloride: 104 mmol/L (ref 98–111)
Creatinine: 1.16 mg/dL — ABNORMAL HIGH (ref 0.44–1.00)
GFR, Estimated: 50 mL/min — ABNORMAL LOW (ref >60)
Glucose, Bld: 99 mg/dL (ref 70–99)
Potassium: 4.4 mmol/L (ref 3.5–5.1)
Sodium: 139 mmol/L (ref 135–145)
Total Bilirubin: 0.3 mg/dL (ref 0.0–1.2)
Total Protein: 6.4 g/dL — ABNORMAL LOW (ref 6.5–8.1)

## 2023-11-28 LAB — MAGNESIUM: Magnesium: 2.1 mg/dL (ref 1.7–2.4)

## 2023-11-28 NOTE — Telephone Encounter (Signed)
 Patient has been scheduled for follow-up visit per 11/28/23 LOS.  Pt noted appt details on personal planner/calendar.

## 2023-11-28 NOTE — Progress Notes (Signed)
 [facility]  [address] [place],  [place]  [address] [phone]  Clinic Day:  10/31/23  Referring physician: [name], MD  ASSESSMENT & PLAN:  Assessment: Invasive lobular carcinoma This was found on screening mammogram but the MRI reveals focal enhancement surrounding the biopsy clip up to 2.3 cm and there was non-mass enhancement posterior to this known malignancy on MRI.  This area was biopsied in order to guide her ultimate surgery.This is strongly ER/PR positive, HER2 negative and has a low Ki 67 of less than 5%.  She has now had a lumpectomy but had to go back for reexcision of the margins.  The sentinel lymph node was negative but she did have 2 primaries, measuring 2.4 cm and 2.2 cm, for a T2 N0 M0, stage IIA.  Even though she has several favorable characteristics, such as grade 1 histology and a low Ki 67, I still recommended that we pursue Endopredict testing to quantitate her risk for recurrence, and fortunately she has an EpClin score of 2.6, low risk.  This correlates with a 5.1% risk of distant recurrence in the next 10 years. Her benefit from chemotherapy would be 1.0% so we did not pursue. Her risk for a late recurrence is 4.0%. Unfortunately she has refused hormonal therapy.    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  She was treated with pelvic vulvectomy, bilateral debulking, radical lymphadenectomy, and inguinal dissection in April 2025.  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  There was lymphovascular invasion and carcinoma in situ at the 6 o'clock margin with < 1 mm margin for invasive carcinoma. PET scan revealed metastatic left inguinal lymph nodes up to 1.8 cm in diameter, but no additional evidence of metastatic disease  in the neck, chest, abdomen or pelvis. There was a 4 mm right lower lobe nodule, too small for PET resolution.    She was receiving concurrent chemoradiation with weekly cisplatin , as well as pembrolizuab every 3 weeks since her PDL 1 score is 85%, followed by maintenance pembrolizumab .  She had difficulty tolerating treatment due to nausea, vomiting and diarrhea, as well as hypomagnesemia.  We gave her additional IV fluids weekly and IV magnesium  as needed.  She developed mild pancytopenia felt to be most likely due to treatment.  After her third cycle of weekly cisplatin , she was hospitalized with sepsis due to recurrent cellulitis of the left vulva and thigh.  She then developed shingles of the left thigh.  She had pancytopenia at least in part due to chemotherapy.  At discharge on July 4, WBCs were 2.5, hemoglobin 8.7 and platelets 142,000. She was discharged on doxycycline and valacyclovir.  Due to the multiple issues with chemotherapy and radiation, she has declined taking any further therapy. She had about half of her treatment. I offered her continuation of immunotherapy but she declines that as well.    Osteopenia Her spine shows a T score of -2.6 for osteoporosis and the right femur has osteopenia.  At the very least she should be taking calcium  and vitamin D. Bone density scan done on 07/17/2023 revealed osteopenia.   Strong family history Including multiple females with breast cancer, her mother had colon cancer at age 32, and another maternal aunt had ovarian cancer.  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  She has been counseled on the implications of this and is already getting regular colonoscopy.  However the information will also be passed on to family members to pursue testing.   Plan: She presents to clinic today for follow up symptom management. She developed cellulitis of the left vulva that spread down to her thigh and became septic.  Cellulitis remains, although much less symptomatic. She notes feeling much better. CBC and CMP are unremarkable today. She will not require, nor does she want to have IVF today.  We will continue supportive care and I will see her back in 3 weeks with CBC, CMP, and magnesium . The patient understands the plans discussed today and is in agreement with them.  She knows to contact our office if she develops concerns prior to her next appointment.  I provided 20 minutes of face-to-face time during this encounter and > 50% was spent counseling as documented under my assessment and plan.   [name], FNP - Board Certified [facility] - A DEPT OF [facility]. [facility] [address] [place] [address] Dept: [phone] Dept Fax: [phone]   No orders of the defined types were placed in this encounter.   CHIEF COMPLAINT:  CC: Stage IIA Breast cancer  Current Treatment: IV ceftriaxone   HISTORY OF PRESENT ILLNESS:  PALLAS WAHLERT 73 y.o. 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  She does have annual mammograms because of her strong family history, but was on hormone replacement therapy for 10 years.  She had a screening mammogram at [facility] on May 05, 2021 which revealed architectural distortion which was felt to be indeterminant.  She had a diagnostic mammogram on March 3 which showed mild nipple retraction and a 1 cm area of focal asymmetry in the retroareolar region.  Ultrasound revealed a hypoechoic irregular mass measuring 9 mm in that area.  On March 14 she had a biopsy performed which revealed a 0.9 cm irregular mass with spiculated margins in the right breast at 12:00, approximately 2 cm from the nipple and anteriorly in the breast.  This was found to be grade 2 invasive mammary carcinoma as well as mammary carcinoma in situ.  The E-cadherin  immunohistochemistry stains were negative and so this is consistent with invasive lobular carcinoma.  The estrogen receptors were positive at 95% and progesterone receptors positive at 95% with HER2 by immunohistochemistry positive at 2+ but FISH was negative.  Ki-67 was less than 5%.  The carcinoma in situ was positive for E-cadherin, consistent with ductal carcinoma in situ.  She had an MRI of the breast and was found to have focal enhancement in the right breast surrounding the biopsy clip and some non-mass enhancement posterior to the malignancy.  It was recommended that she have an MRI guided biopsy of the clumped non-mass enhancement area and this revealed invasive lobular carcinoma grade 1 with lobular carcinoma in situ and extensive fibrocystic changes with focal intraluminal microcalcification and intraductal papillomatosis.   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  It was recommended that she have colonoscopy beginning at age 61 or 10 years prior to the age of her first-degree use relatives age at colorectal cancer diagnosis.  It was recommended this be repeated every 5 years.  This was explained to her.   She has now had a lumpectomy but had to go back for reexcision of the margins.  The sentinel lymph node was negative but she did have 2 primaries, measuring 2.4 cm and 2.2 cm, for a T2 N0 M0, stage IIA. This was ER/PR positive, HER 2 negative and a Ki-67 was 5%.  Even though she has several favorable characteristics, such as grade 1 histology and a low Ki 67, I still recommended that we pursue Endopredict testing to quantitate her risk for recurrence, and  fortunately she has an EpClin score of 2.6, low risk. This correlates with a 5.1% risk of distant recurrence in the next 10 years, with only a 1.0% benefit of chemotherapy. Her risk of late recurrence is 4.0%. I recommended hormonal therapy but she refused.   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 PET scan revealed metastatic left inguinal lymph nodes up to 1.8 cm in diameter, but no additional evidence of metastatic disease in the neck, chest, abdomen or pelvis. There was a 4 mm right lower lobe nodule, too small for PET resolution.  Patient had a pelvic vulvectomy, bilateral debulking, radical lymphadenectomy, and inguinal dissection done on 06/28/2023.  Pathology revealed 3/4 positive nodes on the left side, up to 3 cm and with extensive extranodal tumor, and negative nodes on the right side. The vulvar carcinoma measured 3 cm with depth of invasion 5 mm and carcinoma in situ, for a T1b N2c M0. She had lymphovascular invasion and carcinoma in situ at the 6 o'clock margin with < 1 mm margin for invasive carcinoma.Diagnostic bilateral mammogram done on 05/04/2023 was clear. Bone density scan done on 07/17/2023 revealed osteopenia.  Oncology History  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)  06/08/2021 Initial Diagnosis   Breast cancer in female [facility])   07/23/2021 Genetic Testing   Negative hereditary cancer genetic testing: no pathogenic variants detected in [facility] Panel. Report date is July 23, 2021.  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  The report date is Jul 26, 2021.  The BRCAplus panel offered by [employer] and includes sequencing and deletion/duplication analysis for the following 8 genes: ATM, BRCA1, BRCA2, CDH1, CHEK2, PALB2, PTEN, and TP53.  Results of pan-cancer panel pending.    The CancerNext-Expanded gene panel offered by [facility] and includes sequencing and rearrangement analysis for the following 77 genes: AIP, ALK, APC*, ATM*, AXIN2, BAP1, BARD1, BLM, BMPR1A, BRCA1*, BRCA2*, BRIP1*, CDC73, CDH1*, CDK4, CDKN1B, CDKN2A, CHEK2*, CTNNA1, DICER1, FANCC, FH, FLCN, GALNT12, KIF1B, LZTR1, MAX, MEN1, MET, MLH1*, MSH2*, MSH3, MSH6*, MUTYH*, NBN, NF1*, NF2, NTHL1, PALB2*, PHOX2B, PMS2*, POT1, PRKAR1A, PTCH1, PTEN*, RAD51C*, RAD51D*, RB1, RECQL, RET, SDHA, SDHAF2, SDHB, SDHC, SDHD, SMAD4, SMARCA4, SMARCB1, SMARCE1, STK11, SUFU, TMEM127, TP53*, TSC1, TSC2, VHL and XRCC2 (sequencing and deletion/duplication); EGFR, EGLN1, HOXB13, KIT, MITF, PDGFRA, POLD1, and POLE (sequencing only); EPCAM and GREM1 (deletion/duplication only). DNA and RNA analyses performed for * genes.    08/23/2021 Cancer Staging   Staging form: Breast, AJCC 8th Edition - Pathologic stage  from 08/23/2021: Stage IA (pT2(2), pN0(sn), cM0, G1, ER+, PR+, HER2-) - Signed by Cornelius Wanda DEL, MD on 09/29/2021 Histopathologic type: Lobular carcinoma, NOS Stage prefix: Initial diagnosis Method of lymph node assessment: Sentinel lymph node biopsy Nuclear grade: G1 Multigene prognostic tests performed: EndoPredict Histologic grading system: 3 grade system Residual tumor (R): R0 - None Laterality: Right Tumor size (mm): 24 Multiple tumors: Yes Number of tumors: 2 Lymph-vascular invasion (LVI): LVI not present (absent)/not identified Diagnostic confirmation: Positive histology PLUS positive immunophenotyping and/or positive genetic studies Specimen type: Excision Staged by: Managing physician Menopausal status: Postmenopausal Ki-67 (%): 5 Stage used in treatment planning: Yes National guidelines used in treatment planning: Yes Type of national guideline used in treatment planning: NCCN   Vulvar cancer (HCC)  06/28/2023 Initial Diagnosis   Vulvar cancer (HCC)   06/28/2023 Cancer Staging   Staging form:  Vulva, AJCC V9 - Clinical stage from 06/28/2023: FIGO Stage IIIC (cT1b, cN1c, cM0) - Signed by Cornelius Wanda DEL, MD on 07/31/2023 Histopathologic type: Squamous cell carcinoma, NOS Stage prefix: Initial diagnosis Method of lymph node assessment: Lymph node dissection Histologic grade (G): G2 Histologic grading system: 3 grade system Tumor size (mm): 30 Lymph-vascular invasion (LVI): LVI present/identified, NOS Diagnostic confirmation: Positive histology Specimen type: Excision Staged by: Managing physician Femoral-inguinal nodal status: Positive Solitary (s) or multifocal (m) tumors in the primary site: Solitary Perineural invasion (PNI): Unknown Stage used in treatment planning: Yes National guidelines used in treatment planning: Yes Type of national guideline used in treatment planning: NCCN   09/04/2023 - 09/18/2023 Chemotherapy   Patient is on Treatment Plan : CERVICAL Pembrolizumab  q21d + XRT (cisplatin  d/c'd 09/25/23)     12/22/2023 - 12/22/2023 Chemotherapy   Patient is on Treatment Plan : CERVICAL Pembrolizumab  (200) q21d      INTERVAL HISTORY:  Rayhana is here today for repeat clinical assessment for her Breast cancer, stage IIA breast cancer. Patient states that she feels well and has no complaint of pain. She has 2 weeks left of her Keflex .  During physical exam I noticed a dark keratosis on her right anterior lower tibia. Patient states that this has been there for years and has gradually gotten larger. I recommended she go to dermatology at some point to further evaluate this. We will also have her reevaluated by Dr. Viktoria gyn-oncology in the Fall. She has a WBC of 5.0, low hemoglobin of 9.5, and platelet count of 317,000. Her CMP is fairly normal other than a mildly low total protein of 6.4 and elevated creatinine of 1.06. Her magnesium  has returned to normal from 1.5 to 2.0. We re-discussed the option of immunotherapy and patient states that she declines to take any further  therapy. We will continue supportive care and I will see her back in 2 weeks with CBC, CMP, and magnesium .    She denies fever, chills, night sweats, or other signs of infection. She denies cardiorespiratory and gastrointestinal issues. She  denies pain. Her appetite is good and Her weight has been stable.    REVIEW OF SYSTEMS:   Review of Systems  Constitutional:  Negative for appetite change, chills, diaphoresis, fatigue, fever and unexpected weight change.  HENT:  Negative.  Negative for lump/mass, mouth sores and sore throat.   Eyes:  Positive for eye problems.  Respiratory: Negative.  Negative for chest tightness, cough, hemoptysis, shortness of breath and wheezing.   Cardiovascular: Negative.  Negative for chest pain, leg swelling and palpitations.  Gastrointestinal: Negative.  Negative for abdominal  distention, abdominal pain, blood in stool, constipation, diarrhea, nausea and vomiting.  Endocrine: Negative.   Genitourinary: Negative.  Negative for difficulty urinating, dysuria, frequency, hematuria, vaginal bleeding and vaginal discharge.        Mild erythema of the genitalia   Musculoskeletal:  Negative for arthralgias, back pain, flank pain, gait problem and myalgias.  Skin: Negative.  Negative for rash.  Neurological:  Negative for dizziness, extremity weakness, gait problem, headaches, light-headedness, numbness, seizures and speech difficulty.  Hematological: Negative.  Negative for adenopathy. Does not bruise/bleed easily.  Psychiatric/Behavioral: Negative.  Negative for depression and sleep disturbance. The patient is not nervous/anxious.    VITALS:   There were no vitals filed for this visit.  Wt Readings from Last 3 Encounters:  11/14/23 210 lb 4.8 oz (95.4 kg)  10/31/23 208 lb 11.2 oz (94.7 kg)  10/25/23 208 lb 6.4 oz (94.5 kg)    There is no height or weight on file to calculate BMI.  Performance status (ECOG): 1 - Symptomatic but completely  ambulatory  PHYSICAL EXAM:  Physical Exam Vitals and nursing note reviewed.  Constitutional:      General: She is not in acute distress.    Appearance: Normal appearance. She is normal weight. She is not ill-appearing.  HENT:     Head: Normocephalic and atraumatic.     Right Ear: Tympanic membrane, ear canal and external ear normal. There is no impacted cerumen.     Left Ear: Tympanic membrane, ear canal and external ear normal. There is no impacted cerumen.     Nose: Nose normal. No congestion or rhinorrhea.     Mouth/Throat:     Mouth: Mucous membranes are moist.     Pharynx: Oropharynx is clear. No oropharyngeal exudate or posterior oropharyngeal erythema.  Eyes:     General: No scleral icterus.       Right eye: No discharge.        Left eye: No discharge.     Extraocular Movements: Extraocular movements intact.     Conjunctiva/sclera: Conjunctivae normal.     Pupils: Pupils are equal, round, and reactive to light.  Cardiovascular:     Rate and Rhythm: Normal rate and regular rhythm.     Pulses: Normal pulses.     Heart sounds: Normal heart sounds. No murmur heard.    No friction rub. No gallop.  Pulmonary:     Effort: Pulmonary effort is normal. No respiratory distress.     Breath sounds: Normal breath sounds. No stridor. No wheezing, rhonchi or rales.  Abdominal:     General: Bowel sounds are normal. There is no distension.     Palpations: Abdomen is soft. There is no hepatomegaly, splenomegaly or mass.     Tenderness: There is no abdominal tenderness. There is no right CVA tenderness, left CVA tenderness, guarding or rebound.     Hernia: No hernia is present.  Genitourinary:    Comments: Genitalia has some mild pink coloration No exudate Musculoskeletal:        General: Normal range of motion.     Cervical back: Normal range of motion and neck supple. No tenderness.     Right lower leg: No edema.     Left lower leg: No edema.  Lymphadenopathy:     Cervical: No  cervical adenopathy.     Right cervical: No superficial, deep or posterior cervical adenopathy.    Left cervical: No superficial, deep or posterior cervical adenopathy.     Upper Body:  Right upper body: No supraclavicular, axillary or pectoral adenopathy.     Left upper body: No supraclavicular, axillary or pectoral adenopathy.  Skin:    General: Skin is warm and dry.     Coloration: Skin is not jaundiced.     Findings: Lesion present. No erythema or rash.     Comments: Right anterior lower tibia has a crusted keratotic lesion of the skin, which she says may be enlarging  Neurological:     General: No focal deficit present.     Mental Status: She is alert and oriented to person, place, and time. Mental status is at baseline.     Cranial Nerves: No cranial nerve deficit.  Psychiatric:        Mood and Affect: Mood normal.        Behavior: Behavior normal.        Thought Content: Thought content normal.        Judgment: Judgment normal.    LABS:      Latest Ref Rng & Units 11/14/2023   10:09 AM 10/31/2023    9:43 AM 10/25/2023   10:26 AM  CBC  WBC 4.0 - 10.5 K/uL 3.9  5.0  8.3   Hemoglobin 12.0 - 15.0 g/dL 9.5  9.5  9.7   Hematocrit 36.0 - 46.0 % 29.1  28.8  29.1   Platelets 150 - 400 K/uL 171  217  159       Latest Ref Rng & Units 11/14/2023   10:09 AM 10/31/2023    9:43 AM 10/25/2023   10:26 AM  CMP  Glucose 70 - 99 mg/dL 97  894  869   BUN 8 - 23 mg/dL 22  19  21    Creatinine 0.44 - 1.00 mg/dL 8.95  8.93  8.97   Sodium 135 - 145 mmol/L 139  140  134   Potassium 3.5 - 5.1 mmol/L 4.1  4.5  4.0   Chloride 98 - 111 mmol/L 105  105  99   CO2 22 - 32 mmol/L 23  25  22    Calcium  8.9 - 10.3 mg/dL 9.4  9.6  9.3   Total Protein 6.5 - 8.1 g/dL 6.2  6.4  6.4   Total Bilirubin 0.0 - 1.2 mg/dL 0.2  0.2  0.4   Alkaline Phos 38 - 126 U/L 64  80  85   AST 15 - 41 U/L 21  24  18    ALT 0 - 44 U/L 24  25  19     Lab Results  Component Value Date   TSH 0.815 10/25/2023   T4TOTAL 7.9  10/25/2023   Lab Results  Component Value Date   TIBC 441 10/11/2023   FERRITIN 65 10/11/2023   IRONPCTSAT 14 10/11/2023     STUDIES:  EXAM: 07/17/2023 DUAL X-RAY ABSORPTIOMETRY (DXA) FOR BONE MINERAL DENSITY LUMBAR SPINE (L2-L4): BMD (in g/cm2): 0.912 T-score: -2.4 Z-score: -0.7   LEFT FEMORAL NECK: BMD (in g/cm2): 0.793 T-score: -1.8 Z-score: 0.0   LEFT TOTAL HIP: BMD (in g/cm2): 0.922 T-score: -0.7 Z-score: 0.9   RIGHT FEMORAL NECK: BMD (in g/cm2): 0.855 T-score: -1.3 Z-score: 0.5   RIGHT TOTAL HIP: BMD (in g/cm2): 0.896 T-score: -0.9 Z-score: 0.7   EXAM: 06/05/2023 NUCLEAR MEDICINE PET SKULL BASE TO THIGH IMPRESSION: 1. Metastatic left inguinal lymph nodes. No additional evidence of metastatic disease in the neck, chest, abdomen or pelvis. 2. 4 mm right lower lobe nodule, too small for PET resolution. Recommend attention on follow-up. 3.  Small to moderate hiatal hernia. 4.  Aortic atherosclerosis (ICD10-I70.0).  HISTORY:   Past Medical History:  Diagnosis Date   Anemia    Anxiety    Cancer (HCC)    right breast ILC   Complication of anesthesia    difficulty waking up   Depression    Diverticulitis    Family history of breast cancer    Family history of ovarian cancer    Family history of prostate cancer    Fibromyalgia    GERD (gastroesophageal reflux disease)    Heart murmur    History of colonic polyps    History of hiatal hernia    Hyperlipidemia    Hypertension    IBS (irritable bowel syndrome)    Ischemic colitis (HCC) 03/2012   Osteoporosis    Pre-diabetes    RLS (restless legs syndrome)    Sinusitis    Sleep apnea    CPAP nightly    Past Surgical History:  Procedure Laterality Date   BREAST LUMPECTOMY WITH RADIOACTIVE SEED LOCALIZATION Right 08/04/2021   Procedure: RIGHT BREAST LUMPECTOMY WITH RADIOACTIVE SEED LOCALIZATION;  Surgeon: Vanderbilt Ned, MD;  Location: [facility];  Service: General;  Laterality: Right;    CHOLECYSTECTOMY  2004   COLONOSCOPY WITH PROPOFOL  N/A 04/18/2017   Procedure: COLONOSCOPY WITH PROPOFOL ;  Surgeon: Kristie Lamprey, MD;  Location: [facility];  Service: Endoscopy;  Laterality: N/A;   ESOPHAGOGASTRODUODENOSCOPY (EGD) WITH PROPOFOL  N/A 04/18/2017   Procedure: ESOPHAGOGASTRODUODENOSCOPY (EGD) WITH PROPOFOL ;  Surgeon: Kristie Lamprey, MD;  Location: [facility];  Service: Endoscopy;  Laterality: N/A;   EXAM UNDER ANESTHESIA, PELVIC N/A 06/28/2023   Procedure: EXAM UNDER ANESTHESIA, PELVIC;  Surgeon: Viktoria Comer SAUNDERS, MD;  Location: [facility];  Service: Gynecology;  Laterality: N/A;   FLEXIBLE SIGMOIDOSCOPY  04/20/2012   Procedure: FLEXIBLE SIGMOIDOSCOPY;  Surgeon: Belvie JONETTA Just, MD;  Location: [facility];  Service: Endoscopy;  Laterality: N/A;   INGUINAL LYMPHADENECTOMY N/A 06/28/2023   Procedure: LYMPHADENECTOMY, INGUINAL, OPEN;  Surgeon: Viktoria Comer SAUNDERS, MD;  Location: [facility];  Service: Gynecology;  Laterality: N/A;   IR IMAGING GUIDED PORT INSERTION  07/31/2023   NISSEN FUNDOPLICATION  2011   PARAESOPHAGEAL HERNIA REPAIR  09/11/2009   and Nissen fundoplication   RADICAL VULVECTOMY N/A 06/28/2023   Procedure: VULVECTOMY, RADICAL;  Surgeon: Viktoria Comer SAUNDERS, MD;  Location: [facility];  Service: Gynecology;  Laterality: N/A;  Possible skin flap   RE-EXCISION OF BREAST LUMPECTOMY Right 08/18/2021   Procedure: RE-EXCISION RIGHT BREAST LUMPECTOMY;  Surgeon: Vanderbilt Ned, MD;  Location: [facility];  Service: General;  Laterality: Right;   SENTINEL NODE BIOPSY N/A 08/04/2021   Procedure: SENTINEL NODE BIOPSY;  Surgeon: Vanderbilt Ned, MD;  Location: [facility];  Service: General;  Laterality: N/A;   TONSILLECTOMY  1960's   TOTAL ABDOMINAL HYSTERECTOMY  2001   w/ BSO    Family History  Problem Relation Age of Onset   Colon cancer Mother 7   Alzheimer's disease Mother    Heart attack Father    Cancer Maternal Aunt        x2   Alzheimer's disease Maternal Aunt     Ovarian cancer Maternal Aunt    Prostate cancer Maternal Uncle    Kidney disease Maternal Uncle    Atrial fibrillation Maternal Uncle    Stroke Paternal Uncle    Aneurysm Paternal Grandmother        brain   Stroke Paternal Grandfather    Prostate cancer Cousin  paternal first cousin   Prostate cancer Cousin        paternal first cousin   Esophageal cancer Cousin        maternal first cousin   Breast cancer Cousin        DCIS, maternal first cousin   Ovarian cancer Cousin        maternal first cousin   Rheum arthritis Other    Lung disease Neg Hx     Social History:  reports that she has never smoked. She has never used smokeless tobacco. She reports that she does not drink alcohol and does not use drugs.The patient is alone today.  Allergies:  Allergies  Allergen Reactions   Cefuroxime Other (See Comments), Diarrhea and Nausea Only    IBS   Sulfa  Antibiotics Nausea And Vomiting and Nausea Only   Tramadol Other (See Comments) and Nausea Only   Ceftin [Cefuroxime Axetil] Diarrhea   Compazine  [Prochlorperazine ] Other (See Comments)    Vision problems   Ondansetron  Hcl    Other Other (See Comments)    Pneumonia vaccine (uncoded)   Tramadol Hcl Nausea And Vomiting   Zofran  [Ondansetron ] Other (See Comments)    Vision changes    Current Medications: Current Outpatient Medications  Medication Sig Dispense Refill   acetaminophen  (TYLENOL ) 650 MG CR tablet 1,300 mg in the morning and at bedtime.     amLODipine  (NORVASC ) 5 MG tablet Take 5 mg by mouth daily.     Ascorbic Acid (VITAMIN C) 500 MG CHEW Chew 500 mg by mouth daily.     aspirin EC 81 MG tablet Take 81 mg by mouth at bedtime. Swallow whole.     atenolol  (TENORMIN ) 50 MG tablet Take 75 mg by mouth daily.      benazepril  (LOTENSIN ) 10 MG tablet Take 10 mg by mouth daily.     benzonatate (TESSALON) 100 MG capsule Take 100-200 mg by mouth at bedtime.     Biotin 1000 MCG tablet Take 1,000 mcg by mouth daily.      Calcium  Citrate-Vitamin D (CITRACAL + D PO) Take 1 tablet by mouth daily.     cetirizine (ZYRTEC) 10 MG tablet Take 10 mg by mouth at bedtime.     Cholecalciferol (VITAMIN D) 50 MCG (2000 UT) tablet Take 2,000 Units by mouth daily.     cyanocobalamin  1000 MCG tablet Take 1,000 mcg by mouth daily.     diclofenac sodium (VOLTAREN) 1 % GEL Apply 1 application  topically daily as needed (for pain).     dicyclomine (BENTYL) 10 MG capsule Take 1 capsule by mouth as needed.     diphenhydrAMINE  (BENADRYL ) 25 MG tablet Take 25 mg by mouth every 6 (six) hours as needed for allergies.     diphenoxylate-atropine (LOMOTIL) 2.5-0.025 MG tablet Take 2 tablets by mouth 4 (four) times daily as needed for diarrhea or loose stools.     DULoxetine  (CYMBALTA ) 60 MG capsule 2 capsules Orally once a day     EPINEPHrine  (EPIPEN  2-PAK) 0.3 mg/0.3 mL IJ SOAJ injection Inject 0.3 mg into the muscle as needed for anaphylaxis.     famotidine  (PEPCID ) 20 MG tablet Take 20 mg by mouth at bedtime.     fexofenadine (ALLEGRA) 180 MG tablet Take 180 mg by mouth in the morning.     fluticasone  (FLONASE ) 50 MCG/ACT nasal spray Place 1 spray into both nostrils daily.     lidocaine  (XYLOCAINE ) 5 % ointment Apply 1 Application topically 2 (two) times daily  as needed for moderate pain (pain score 4-6) (to the vulva). Apply to vulva as needed for discomfort 35.44 g 3   lidocaine  4 % Place 1 patch onto the skin daily.     LORazepam  (ATIVAN ) 1 MG tablet Take 1 tablet (1 mg total) by mouth every 8 (eight) hours. 30 tablet 0   NON FORMULARY Pt uses a c-pap nightly     Nutritional Supplements (JUICE PLUS FIBRE PO) Take 2 each by mouth daily. Fruits and Vegetables     nystatin  (MYCOSTATIN ) 100000 UNIT/ML suspension Take 5 mLs (500,000 Units total) by mouth 4 (four) times daily. 60 mL 2   nystatin  (MYCOSTATIN /NYSTOP ) powder Apply 1 Application topically 3 (three) times daily. 30 g 5   Omega-3 Fatty Acids (FISH OIL) 1000 MG CAPS Take 1,000 mg by  mouth daily.      pantoprazole  (PROTONIX ) 40 MG tablet Take 40 mg by mouth every evening.     Phenylephrine -APAP-Guaifenesin (TYLENOL  SINUS SEVERE PO) Take 2 tablets by mouth daily as needed (drainage).     polyvinyl alcohol (LIQUIFILM TEARS) 1.4 % ophthalmic solution Place 1 drop into both eyes 5 (five) times daily.     Probiotic Product (PROBIOTIC & ACIDOPHILUS EX ST PO) Take 1 capsule by mouth daily. Ultra Flora Plus Capsules     Propylene Glycol (SYSTANE COMPLETE) 0.6 % SOLN Place 1 drop into both eyes in the morning and at bedtime.     rOPINIRole  (REQUIP ) 0.5 MG tablet Take 0.5 mg by mouth at bedtime.     senna-docusate (SENOKOT-S) 8.6-50 MG tablet Take 2 tablets by mouth at bedtime. For AFTER surgery, do not take if having diarrhea 30 tablet 0   simvastatin  (ZOCOR ) 10 MG tablet Take 10 mg by mouth at bedtime.       sucralfate  (CARAFATE ) 1 GM/10ML suspension Take 1 g by mouth 2 (two) times daily.     traZODone  (DESYREL ) 50 MG tablet Take 50 mg by mouth at bedtime.     triamcinolone  (KENALOG ) 0.025 % ointment Apply 1 Application topically 2 (two) times daily. 30 g 0   vitamin E 400 UNIT capsule Take 400 Units by mouth daily.       White Petrolatum -Mineral Oil (REFRESH P.M. OP) Place 1 Application into both eyes at bedtime.     No current facility-administered medications for this visit.

## 2023-12-04 DIAGNOSIS — G4733 Obstructive sleep apnea (adult) (pediatric): Secondary | ICD-10-CM | POA: Diagnosis not present

## 2023-12-06 ENCOUNTER — Other Ambulatory Visit: Payer: Self-pay | Admitting: Hematology and Oncology

## 2023-12-06 ENCOUNTER — Other Ambulatory Visit: Payer: Self-pay

## 2023-12-06 ENCOUNTER — Telehealth: Payer: Self-pay

## 2023-12-06 ENCOUNTER — Inpatient Hospital Stay

## 2023-12-06 ENCOUNTER — Inpatient Hospital Stay (HOSPITAL_BASED_OUTPATIENT_CLINIC_OR_DEPARTMENT_OTHER): Admitting: Hematology and Oncology

## 2023-12-06 VITALS — BP 120/72 | HR 58 | Temp 99.0°F | Resp 20 | Ht 63.0 in | Wt 210.3 lb

## 2023-12-06 DIAGNOSIS — L03311 Cellulitis of abdominal wall: Secondary | ICD-10-CM | POA: Diagnosis not present

## 2023-12-06 DIAGNOSIS — C519 Malignant neoplasm of vulva, unspecified: Secondary | ICD-10-CM

## 2023-12-06 DIAGNOSIS — Z1502 Genetic susceptibility to malignant neoplasm of ovary: Secondary | ICD-10-CM | POA: Diagnosis not present

## 2023-12-06 DIAGNOSIS — Z1509 Genetic susceptibility to other malignant neoplasm: Secondary | ICD-10-CM | POA: Diagnosis not present

## 2023-12-06 DIAGNOSIS — E860 Dehydration: Secondary | ICD-10-CM

## 2023-12-06 DIAGNOSIS — M8589 Other specified disorders of bone density and structure, multiple sites: Secondary | ICD-10-CM | POA: Diagnosis not present

## 2023-12-06 DIAGNOSIS — Z803 Family history of malignant neoplasm of breast: Secondary | ICD-10-CM | POA: Diagnosis not present

## 2023-12-06 LAB — CMP (CANCER CENTER ONLY)
ALT: 19 U/L (ref 0–44)
AST: 19 U/L (ref 15–41)
Albumin: 3.7 g/dL (ref 3.5–5.0)
Alkaline Phosphatase: 73 U/L (ref 38–126)
Anion gap: 13 (ref 5–15)
BUN: 18 mg/dL (ref 8–23)
CO2: 22 mmol/L (ref 22–32)
Calcium: 9.2 mg/dL (ref 8.9–10.3)
Chloride: 100 mmol/L (ref 98–111)
Creatinine: 1.18 mg/dL — ABNORMAL HIGH (ref 0.44–1.00)
GFR, Estimated: 49 mL/min — ABNORMAL LOW (ref >60)
Glucose, Bld: 112 mg/dL — ABNORMAL HIGH (ref 70–99)
Potassium: 3.5 mmol/L (ref 3.5–5.1)
Sodium: 135 mmol/L (ref 135–145)
Total Bilirubin: 0.5 mg/dL (ref 0.0–1.2)
Total Protein: 6.2 g/dL — ABNORMAL LOW (ref 6.5–8.1)

## 2023-12-06 LAB — CBC WITH DIFFERENTIAL (CANCER CENTER ONLY)
Abs Immature Granulocytes: 0.05 K/uL (ref 0.00–0.07)
Basophils Absolute: 0 K/uL (ref 0.0–0.1)
Basophils Relative: 0 %
Eosinophils Absolute: 0 K/uL (ref 0.0–0.5)
Eosinophils Relative: 0 %
HCT: 29.7 % — ABNORMAL LOW (ref 36.0–46.0)
Hemoglobin: 10.2 g/dL — ABNORMAL LOW (ref 12.0–15.0)
Immature Granulocytes: 1 %
Lymphocytes Relative: 4 %
Lymphs Abs: 0.4 K/uL — ABNORMAL LOW (ref 0.7–4.0)
MCH: 29.7 pg (ref 26.0–34.0)
MCHC: 34.3 g/dL (ref 30.0–36.0)
MCV: 86.3 fL (ref 80.0–100.0)
Monocytes Absolute: 0.4 K/uL (ref 0.1–1.0)
Monocytes Relative: 4 %
Neutro Abs: 8.9 K/uL — ABNORMAL HIGH (ref 1.7–7.7)
Neutrophils Relative %: 91 %
Platelet Count: 135 K/uL — ABNORMAL LOW (ref 150–400)
RBC: 3.44 MIL/uL — ABNORMAL LOW (ref 3.87–5.11)
RDW: 14.4 % (ref 11.5–15.5)
WBC Count: 9.7 K/uL (ref 4.0–10.5)
nRBC: 0 % (ref 0.0–0.2)

## 2023-12-06 LAB — MAGNESIUM: Magnesium: 1.7 mg/dL (ref 1.7–2.4)

## 2023-12-06 MED ORDER — SODIUM CHLORIDE 0.9 % IV SOLN: INTRAVENOUS | Status: DC

## 2023-12-06 MED ORDER — SODIUM CHLORIDE 0.9 % IV SOLN
2.0000 g | INTRAVENOUS | Status: DC
  Administered 2023-12-06: 2 g via INTRAVENOUS
  Filled 2023-12-06: qty 10

## 2023-12-06 NOTE — Telephone Encounter (Signed)
 Pt has called req to be seen today. 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 She fell up against wall/furniture last evening, but didn't hit floor. Denies feeling dizzy. She had fever up to 101.5 last night. No cough, congestion, sore throat or other s/s infection. Admits to headache. Please advise.

## 2023-12-06 NOTE — Progress Notes (Signed)
 [facility]  [address] [place],  [place]  [address] [phone]  Clinic Day:  10/31/23  Referring physician: [name], MD  ASSESSMENT & PLAN:  Assessment: Invasive lobular carcinoma This was found on screening mammogram but the MRI reveals focal enhancement surrounding the biopsy clip up to 2.3 cm and there was non-mass enhancement posterior to this known malignancy on MRI.  This area was biopsied in order to guide her ultimate surgery.This is strongly ER/PR positive, HER2 negative and has a low Ki 67 of less than 5%.  She has now had a lumpectomy but had to go back for reexcision of the margins.  The sentinel lymph node was negative but she did have 2 primaries, measuring 2.4 cm and 2.2 cm, for a T2 N0 M0, stage IIA.  Even though she has several favorable characteristics, such as grade 1 histology and a low Ki 67, I still recommended that we pursue Endopredict testing to quantitate her risk for recurrence, and fortunately she has an EpClin score of 2.6, low risk.  This correlates with a 5.1% risk of distant recurrence in the next 10 years. Her benefit from chemotherapy would be 1.0% so we did not pursue. Her risk for a late recurrence is 4.0%. Unfortunately she has refused hormonal therapy.    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  She was treated with pelvic vulvectomy, bilateral debulking, radical lymphadenectomy, and inguinal dissection in April 2025.  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  There was lymphovascular invasion and carcinoma in situ at the 6 o'clock margin with < 1 mm margin for invasive carcinoma. PET scan revealed metastatic left inguinal lymph nodes up to 1.8 cm in diameter, but no additional evidence of metastatic disease  in the neck, chest, abdomen or pelvis. There was a 4 mm right lower lobe nodule, too small for PET resolution.    She was receiving concurrent chemoradiation with weekly cisplatin , as well as pembrolizuab every 3 weeks since her PDL 1 score is 85%, followed by maintenance pembrolizumab .  She had difficulty tolerating treatment due to nausea, vomiting and diarrhea, as well as hypomagnesemia.  We gave her additional IV fluids weekly and IV magnesium  as needed.  She developed mild pancytopenia felt to be most likely due to treatment.  After her third cycle of weekly cisplatin , she was hospitalized with sepsis due to recurrent cellulitis of the left vulva and thigh.  She then developed shingles of the left thigh.  She had pancytopenia at least in part due to chemotherapy.  At discharge on July 4, WBCs were 2.5, hemoglobin 8.7 and platelets 142,000. She was discharged on doxycycline and valacyclovir.  Due to the multiple issues with chemotherapy and radiation, she has declined taking any further therapy. She had about half of her treatment. I offered her continuation of immunotherapy but she declines that as well.    Osteopenia Her spine shows a T score of -2.6 for osteoporosis and the right femur has osteopenia.  At the very least she should be taking calcium  and vitamin D. Bone density scan done on 07/17/2023 revealed osteopenia.   Strong family history Including multiple females with breast cancer, her mother had colon cancer at age 21, and another maternal aunt had ovarian cancer.  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  She has been counseled on the implications of this and is already getting regular colonoscopy.  However the information will also be passed on to family members to pursue testing.   Plan: She presents to clinic today for follow up symptom management. She developed cellulitis of the left vulva that spread down to her thigh and became septic.  This was improving; however last night she notes a fever of 101 and increasing discomfort to the area. She states it felt like she may be having a shingles outbreak. On exam, no blisters, weeping or discharge was noted, although the left inner thigh, bilateral vulva, mons and lower abdomen remain quite red and  hot to touch. White count today is 9.5. Temp today is 99. I have obtained blood cultures x 2. We will plan for 2 grams IV rocephin  today, tomorrow and Friday. I will then continue with oral Keflex . We will continue supportive care and I will see her back in 3 weeks with CBC, CMP, and magnesium . The patient understands the plans discussed today and is in agreement with them.  She knows to contact our office if she develops concerns prior to her next appointment.  I provided 30 minutes of face-to-face time during this encounter and > 50% was spent counseling as documented under my assessment and plan.   [name], FNP - Board Certified  C[facility]ER CENTER [facility] - A DEPT OF [facility] [address] [place] [address] Dept: [phone] Dept Fax: [phone]   No orders of the defined types were placed in this encounter.   CHIEF COMPLAINT:  CC: Stage IIA Breast cancer  Current Treatment: IV ceftriaxone   HISTORY OF PRESENT ILLNESS:  TERRAN HOLLENKAMP 73 y.o. 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  She does have annual mammograms because of her strong family history, but was on hormone replacement therapy for 10 years.  She had a screening mammogram at [facility] on May 05, 2021 which revealed architectural distortion which was felt to be indeterminant.  She had a diagnostic mammogram on March 3 which showed mild nipple retraction and a 1 cm area of focal asymmetry in the retroareolar region.  Ultrasound revealed a hypoechoic irregular mass measuring 9 mm in  that area.  On March 14 she had a biopsy performed which revealed a 0.9 cm irregular mass with spiculated margins in the right breast at 12:00, approximately 2 cm from the nipple and anteriorly in the breast.  This was found to be grade 2 invasive mammary carcinoma as well as mammary carcinoma in situ.  The E-cadherin immunohistochemistry stains were negative and so this is consistent with invasive lobular carcinoma.  The estrogen receptors were positive at 95% and progesterone receptors positive at 95% with HER2 by immunohistochemistry positive at 2+ but FISH was negative.  Ki-67 was less than 5%.  The carcinoma in situ was positive for E-cadherin, consistent with ductal carcinoma in situ.  She had an MRI of the breast and was found to have focal enhancement in the right breast surrounding the biopsy clip and some non-mass enhancement posterior to the malignancy.  It was recommended that she have an MRI guided biopsy of the clumped non-mass enhancement area and this revealed invasive lobular carcinoma grade 1 with lobular carcinoma in situ and extensive fibrocystic changes with focal  intraluminal microcalcification and intraductal papillomatosis.   Due to her strong family history including multiple females with breast cancer in her mother with colon cancer at age 77, she did have genetic testing with [facility] and was found to have a carrier mutation of MUTYH.  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  It was recommended that she have colonoscopy beginning at age 53 or 10 years prior to the age of her first-degree use relatives age at colorectal cancer diagnosis.  It was recommended this be repeated every 5 years.  This was explained to her.   She has now had a lumpectomy but had to go back for reexcision of the margins.  The sentinel lymph node was negative but she did have 2 primaries,  measuring 2.4 cm and 2.2 cm, for a T2 N0 M0, stage IIA. This was ER/PR positive, HER 2 negative and a Ki-67 was 5%.  Even though she has several favorable characteristics, such as grade 1 histology and a low Ki 67, I still recommended that we pursue Endopredict testing to quantitate her risk for recurrence, and fortunately she has an EpClin score of 2.6, low risk. This correlates with a 5.1% risk of distant recurrence in the next 10 years, with only a 1.0% benefit of chemotherapy. Her risk of late recurrence is 4.0%. I recommended hormonal therapy but she refused.   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 PET scan revealed metastatic left inguinal lymph nodes up to 1.8 cm in diameter, but no additional evidence of metastatic disease in the neck, chest, abdomen or pelvis. There was a 4 mm right lower lobe nodule, too small for PET resolution.  Patient had a pelvic vulvectomy, bilateral debulking, radical lymphadenectomy, and inguinal dissection done on 06/28/2023.  Pathology revealed 3/4 positive nodes on the left side, up to 3 cm and with extensive extranodal tumor, and negative nodes on the right side. The vulvar carcinoma measured 3 cm with depth of invasion 5 mm and carcinoma in situ, for a T1b N2c M0. She had lymphovascular invasion and carcinoma in situ at the 6 o'clock margin with < 1 mm margin for invasive carcinoma.Diagnostic bilateral mammogram done on 05/04/2023 was clear. Bone density scan done on 07/17/2023 revealed osteopenia.  Oncology History  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)  06/08/2021 Initial Diagnosis   Breast cancer in female [facility])   07/23/2021 Genetic Testing   Negative hereditary cancer genetic testing: no pathogenic variants detected in [facility] Panel. Report date is July 23, 2021.  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  The report date is Jul 26, 2021.  The BRCAplus panel offered by [employer] and includes sequencing and deletion/duplication analysis for the following 8 genes: ATM, BRCA1, BRCA2, CDH1, CHEK2, PALB2, PTEN, and TP53.  Results of pan-cancer panel pending.   The CancerNext-Expanded gene panel offered by [facility] and includes sequencing and rearrangement analysis for the following 77 genes: AIP, ALK, APC*, ATM*, AXIN2, BAP1, BARD1, BLM, BMPR1A, BRCA1*, BRCA2*, BRIP1*, CDC73, CDH1*, CDK4, CDKN1B, CDKN2A, CHEK2*, CTNNA1, DICER1, FANCC, FH, FLCN, GALNT12, KIF1B, LZTR1, MAX, MEN1, MET, MLH1*, MSH2*, MSH3, MSH6*, MUTYH*, NBN, NF1*, NF2, NTHL1, PALB2*, PHOX2B, PMS2*, POT1, PRKAR1A, PTCH1, PTEN*, RAD51C*, RAD51D*, RB1, RECQL,  RET, SDHA, SDHAF2, SDHB, SDHC, SDHD, SMAD4, SMARCA4, SMARCB1, SMARCE1, STK11, SUFU, TMEM127, TP53*, TSC1, TSC2, VHL and XRCC2 (sequencing and deletion/duplication); EGFR, EGLN1, HOXB13, KIT, MITF, PDGFRA, POLD1, and POLE (sequencing only); EPCAM and GREM1 (deletion/duplication only). DNA and RNA analyses performed for * genes.    08/23/2021 Cancer Staging   Staging form: Breast, AJCC 8th Edition - Pathologic stage from 08/23/2021: Stage IA (pT2(2), pN0(sn), cM0, G1, ER+, PR+, HER2-) - Signed by Cornelius Wanda DEL, MD on 09/29/2021 Histopathologic type: Lobular carcinoma, NOS Stage prefix: Initial diagnosis Method of lymph node assessment: Sentinel lymph node biopsy Nuclear grade: G1 Multigene prognostic tests performed: EndoPredict Histologic grading system: 3 grade system Residual tumor (R): R0 - None Laterality: Right Tumor size (mm): 24 Multiple tumors: Yes Number of tumors: 2 Lymph-vascular invasion (LVI): LVI not present (absent)/not identified Diagnostic confirmation: Positive histology PLUS positive immunophenotyping and/or positive genetic studies Specimen type: Excision Staged by: Managing  physician Menopausal status: Postmenopausal Ki-67 (%): 5 Stage used in treatment planning: Yes National guidelines used in treatment planning: Yes Type of national guideline used in treatment planning: NCCN   Vulvar cancer (HCC)  06/28/2023 Initial Diagnosis   Vulvar cancer (HCC)   06/28/2023 Cancer Staging   Staging form: Vulva, AJCC V9 - Clinical stage from 06/28/2023: FIGO Stage IIIC (cT1b, cN1c, cM0) - Signed by Cornelius Wanda DEL, MD on 07/31/2023 Histopathologic type: Squamous cell carcinoma, NOS Stage prefix: Initial diagnosis Method of lymph node assessment: Lymph node dissection Histologic grade (G): G2 Histologic grading system: 3 grade system Tumor size (mm): 30 Lymph-vascular invasion (LVI): LVI present/identified, NOS Diagnostic confirmation: Positive histology Specimen type: Excision Staged by: Managing physician Femoral-inguinal nodal status: Positive Solitary (s) or multifocal (m) tumors in the primary site: Solitary Perineural invasion (PNI): Unknown Stage used in treatment planning: Yes National guidelines used in treatment planning: Yes Type of national guideline used in treatment planning: NCCN   09/04/2023 - 09/18/2023 Chemotherapy   Patient is on Treatment Plan : CERVICAL Pembrolizumab  q21d + XRT (cisplatin  d/c'd 09/25/23)     12/22/2023 - 12/22/2023 Chemotherapy   Patient is on Treatment Plan : CERVICAL Pembrolizumab  (200) q21d      INTERVAL HISTORY:  Yasenia is here today for repeat clinical assessment for her Breast cancer, stage IIA breast cancer. Patient states that she feels well and has no complaint of pain. She has 2 weeks left of her Keflex .  During physical exam I noticed a dark keratosis on her right anterior lower tibia. Patient states that this has been there for years and has gradually gotten larger. I recommended she go to dermatology at some point to further evaluate this. We will also have her reevaluated by Dr. Viktoria gyn-oncology in the Fall. She  has continued cellulitis to the left inner thigh, bilateral vula, mons and lower abdomen. We re-discussed the option of immunotherapy and patient states that she declines to take any further therapy. We will continue supportive care and I will see her back in 2 weeks with CBC, CMP, and magnesium .    She denies fever, chills, night sweats, or other signs of infection. She denies cardiorespiratory and gastrointestinal issues. She  denies pain. Her appetite is good and Her weight has been stable.    REVIEW OF SYSTEMS:   Review of Systems  Constitutional:  Negative for appetite change, chills, diaphoresis, fatigue, fever and unexpected weight change.  HENT:  Negative.  Negative for lump/mass, mouth sores and sore throat.   Eyes:  Positive for eye problems.  Respiratory: Negative.  Negative for chest tightness, cough, hemoptysis, shortness of breath and wheezing.   Cardiovascular: Negative.  Negative for chest pain, leg swelling and palpitations.  Gastrointestinal: Negative.  Negative for abdominal distention, abdominal pain, blood in stool, constipation, diarrhea, nausea and vomiting.  Endocrine: Negative.   Genitourinary: Negative.  Negative for difficulty urinating, dysuria, frequency, hematuria, vaginal bleeding and vaginal discharge.        Mild erythema of the genitalia   Musculoskeletal:  Negative for arthralgias, back pain, flank pain, gait problem and myalgias.  Skin: Negative.  Negative for rash.  Neurological:  Negative for dizziness, extremity weakness, gait problem, headaches, light-headedness, numbness, seizures and speech difficulty.  Hematological: Negative.  Negative for adenopathy. Does not bruise/bleed easily.  Psychiatric/Behavioral: Negative.  Negative for depression and sleep disturbance. The patient is not nervous/anxious.    VITALS:   Vitals:   12/06/23 0959  BP: 120/72  Pulse: (!) 58  Resp: 20  Temp: 99 F (37.2 C)  SpO2: 100%    Wt Readings from Last 3  Encounters:  12/06/23 210 lb 4.8 oz (95.4 kg)  11/28/23 209 lb 6.4 oz (95 kg)  11/14/23 210 lb 4.8 oz (95.4 kg)    Body mass index is 37.25 kg/m.  Performance status (ECOG): 1 - Symptomatic but completely ambulatory  PHYSICAL EXAM:  Physical Exam Vitals and nursing note reviewed.  Constitutional:      General: She is not in acute distress.    Appearance: Normal appearance. She is normal weight. She is not ill-appearing.  HENT:     Head: Normocephalic and atraumatic.     Right Ear: Tympanic membrane, ear canal and external ear normal. There is no impacted cerumen.     Left Ear: Tympanic membrane, ear canal and external ear normal. There is no impacted cerumen.     Nose: Nose normal. No congestion or rhinorrhea.     Mouth/Throat:     Mouth: Mucous membranes are moist.     Pharynx: Oropharynx is clear. No oropharyngeal exudate or posterior oropharyngeal erythema.  Eyes:     General: No scleral icterus.       Right eye: No discharge.        Left eye: No discharge.     Extraocular Movements: Extraocular movements intact.     Conjunctiva/sclera: Conjunctivae normal.     Pupils: Pupils are equal, round, and reactive to light.  Cardiovascular:     Rate and Rhythm: Normal rate and regular rhythm.     Pulses: Normal pulses.     Heart sounds: Normal heart sounds. No murmur heard.    No friction rub. No gallop.  Pulmonary:     Effort: Pulmonary effort is normal. No respiratory distress.     Breath sounds: Normal breath sounds. No stridor. No wheezing, rhonchi or rales.  Abdominal:     General: Bowel sounds are normal. There is no distension.     Palpations: Abdomen is soft. There is no hepatomegaly, splenomegaly or mass.     Tenderness: There is no abdominal tenderness. There is no right CVA tenderness, left CVA tenderness, guarding or rebound.     Hernia: No hernia is present.  Genitourinary:    Comments: Genitalia has some mild pink coloration No exudate Musculoskeletal:         General: Normal range of motion.     Cervical back: Normal range of motion and neck supple. No tenderness.     Right lower leg: No edema.  Left lower leg: No edema.  Lymphadenopathy:     Cervical: No cervical adenopathy.     Right cervical: No superficial, deep or posterior cervical adenopathy.    Left cervical: No superficial, deep or posterior cervical adenopathy.     Upper Body:     Right upper body: No supraclavicular, axillary or pectoral adenopathy.     Left upper body: No supraclavicular, axillary or pectoral adenopathy.  Skin:    General: Skin is warm and dry.     Coloration: Skin is not jaundiced.     Findings: Lesion present. No erythema or rash.     Comments: Right anterior lower tibia has a crusted keratotic lesion of the skin, which she says may be enlarging  Neurological:     General: No focal deficit present.     Mental Status: She is alert and oriented to person, place, and time. Mental status is at baseline.     Cranial Nerves: No cranial nerve deficit.  Psychiatric:        Mood and Affect: Mood normal.        Behavior: Behavior normal.        Thought Content: Thought content normal.        Judgment: Judgment normal.    LABS:      Latest Ref Rng & Units 12/06/2023    9:27 AM 11/28/2023   10:35 AM 11/14/2023   10:09 AM  CBC  WBC 4.0 - 10.5 K/uL 9.7  4.5  3.9   Hemoglobin 12.0 - 15.0 g/dL 89.7  89.2  9.5   Hematocrit 36.0 - 46.0 % 29.7  32.7  29.1   Platelets 150 - 400 K/uL 135  178  171       Latest Ref Rng & Units 12/06/2023    9:27 AM 11/28/2023   10:35 AM 11/14/2023   10:09 AM  CMP  Glucose 70 - 99 mg/dL 887  99  97   BUN 8 - 23 mg/dL 18  20  22    Creatinine 0.44 - 1.00 mg/dL 8.81  8.83  8.95   Sodium 135 - 145 mmol/L 135  139  139   Potassium 3.5 - 5.1 mmol/L 3.5  4.4  4.1   Chloride 98 - 111 mmol/L 100  104  105   CO2 22 - 32 mmol/L 22  23  23    Calcium  8.9 - 10.3 mg/dL 9.2  9.5  9.4   Total Protein 6.5 - 8.1 g/dL 6.2  6.4  6.2   Total Bilirubin  0.0 - 1.2 mg/dL 0.5  0.3  0.2   Alkaline Phos 38 - 126 U/L 73  73  64   AST 15 - 41 U/L 19  22  21    ALT 0 - 44 U/L 19  18  24     Lab Results  Component Value Date   TSH 0.815 10/25/2023   T4TOTAL 7.9 10/25/2023   Lab Results  Component Value Date   TIBC 441 10/11/2023   FERRITIN 65 10/11/2023   IRONPCTSAT 14 10/11/2023     STUDIES:  EXAM: 07/17/2023 DUAL X-RAY ABSORPTIOMETRY (DXA) FOR BONE MINERAL DENSITY LUMBAR SPINE (L2-L4): BMD (in g/cm2): 0.912 T-score: -2.4 Z-score: -0.7   LEFT FEMORAL NECK: BMD (in g/cm2): 0.793 T-score: -1.8 Z-score: 0.0   LEFT TOTAL HIP: BMD (in g/cm2): 0.922 T-score: -0.7 Z-score: 0.9   RIGHT FEMORAL NECK: BMD (in g/cm2): 0.855 T-score: -1.3 Z-score: 0.5   RIGHT TOTAL HIP: BMD (in g/cm2): 0.896 T-score: -0.9  Z-score: 0.7   EXAM: 06/05/2023 NUCLEAR MEDICINE PET SKULL BASE TO THIGH IMPRESSION: 1. Metastatic left inguinal lymph nodes. No additional evidence of metastatic disease in the neck, chest, abdomen or pelvis. 2. 4 mm right lower lobe nodule, too small for PET resolution. Recommend attention on follow-up. 3. Small to moderate hiatal hernia. 4.  Aortic atherosclerosis (ICD10-I70.0).  HISTORY:   Past Medical History:  Diagnosis Date   Anemia    Anxiety    Cancer (HCC)    right breast ILC   Complication of anesthesia    difficulty waking up   Depression    Diverticulitis    Family history of breast cancer    Family history of ovarian cancer    Family history of prostate cancer    Fibromyalgia    GERD (gastroesophageal reflux disease)    Heart murmur    History of colonic polyps    History of hiatal hernia    Hyperlipidemia    Hypertension    IBS (irritable bowel syndrome)    Ischemic colitis (HCC) 03/2012   Osteoporosis    Pre-diabetes    RLS (restless legs syndrome)    Sinusitis    Sleep apnea    CPAP nightly    Past Surgical History:  Procedure Laterality Date   BREAST LUMPECTOMY WITH RADIOACTIVE  SEED LOCALIZATION Right 08/04/2021   Procedure: RIGHT BREAST LUMPECTOMY WITH RADIOACTIVE SEED LOCALIZATION;  Surgeon: Vanderbilt Ned, MD;  Location: [facility];  Service: General;  Laterality: Right;   CHOLECYSTECTOMY  2004   COLONOSCOPY WITH PROPOFOL  N/A 04/18/2017   Procedure: COLONOSCOPY WITH PROPOFOL ;  Surgeon: Kristie Lamprey, MD;  Location: [facility];  Service: Endoscopy;  Laterality: N/A;   ESOPHAGOGASTRODUODENOSCOPY (EGD) WITH PROPOFOL  N/A 04/18/2017   Procedure: ESOPHAGOGASTRODUODENOSCOPY (EGD) WITH PROPOFOL ;  Surgeon: Kristie Lamprey, MD;  Location: [facility];  Service: Endoscopy;  Laterality: N/A;   EXAM UNDER ANESTHESIA, PELVIC N/A 06/28/2023   Procedure: EXAM UNDER ANESTHESIA, PELVIC;  Surgeon: Viktoria Comer SAUNDERS, MD;  Location: [facility];  Service: Gynecology;  Laterality: N/A;   FLEXIBLE SIGMOIDOSCOPY  04/20/2012   Procedure: FLEXIBLE SIGMOIDOSCOPY;  Surgeon: Belvie JONETTA Just, MD;  Location: [facility];  Service: Endoscopy;  Laterality: N/A;   INGUINAL LYMPHADENECTOMY N/A 06/28/2023   Procedure: LYMPHADENECTOMY, INGUINAL, OPEN;  Surgeon: Viktoria Comer SAUNDERS, MD;  Location: [facility];  Service: Gynecology;  Laterality: N/A;   IR IMAGING GUIDED PORT INSERTION  07/31/2023   NISSEN FUNDOPLICATION  2011   PARAESOPHAGEAL HERNIA REPAIR  09/11/2009   and Nissen fundoplication   RADICAL VULVECTOMY N/A 06/28/2023   Procedure: VULVECTOMY, RADICAL;  Surgeon: Viktoria Comer SAUNDERS, MD;  Location: [facility];  Service: Gynecology;  Laterality: N/A;  Possible skin flap   RE-EXCISION OF BREAST LUMPECTOMY Right 08/18/2021   Procedure: RE-EXCISION RIGHT BREAST LUMPECTOMY;  Surgeon: Vanderbilt Ned, MD;  Location: [facility];  Service: General;  Laterality: Right;   SENTINEL NODE BIOPSY N/A 08/04/2021   Procedure: SENTINEL NODE BIOPSY;  Surgeon: Vanderbilt Ned, MD;  Location: [facility];  Service: General;  Laterality: N/A;   TONSILLECTOMY  1960's   TOTAL ABDOMINAL HYSTERECTOMY  2001   w/ BSO    Family  History  Problem Relation Age of Onset   Colon cancer Mother 75   Alzheimer's disease Mother    Heart attack Father    Cancer Maternal Aunt        x2   Alzheimer's disease Maternal Aunt    Ovarian cancer Maternal Aunt    Prostate cancer  Maternal Uncle    Kidney disease Maternal Uncle    Atrial fibrillation Maternal Uncle    Stroke Paternal Uncle    Aneurysm Paternal Grandmother        brain   Stroke Paternal Grandfather    Prostate cancer Cousin        paternal first cousin   Prostate cancer Cousin        paternal first cousin   Esophageal cancer Cousin        maternal first cousin   Breast cancer Cousin        DCIS, maternal first cousin   Ovarian cancer Cousin        maternal first cousin   Rheum arthritis Other    Lung disease Neg Hx     Social History:  reports that she has never smoked. She has never used smokeless tobacco. She reports that she does not drink alcohol and does not use drugs.The patient is alone today.  Allergies:  Allergies  Allergen Reactions   Cefuroxime Other (See Comments), Diarrhea and Nausea Only    IBS   Sulfa  Antibiotics Nausea And Vomiting and Nausea Only   Tramadol Other (See Comments) and Nausea Only   Ceftin [Cefuroxime Axetil] Diarrhea   Compazine  [Prochlorperazine ] Other (See Comments)    Vision problems   Ondansetron  Hcl    Other Other (See Comments)    Pneumonia vaccine (uncoded)   Tramadol Hcl Nausea And Vomiting   Zofran  [Ondansetron ] Other (See Comments)    Vision changes    Current Medications: Current Outpatient Medications  Medication Sig Dispense Refill   acetaminophen  (TYLENOL ) 650 MG CR tablet 1,300 mg in the morning and at bedtime.     amLODipine  (NORVASC ) 5 MG tablet Take 5 mg by mouth daily.     Ascorbic Acid (VITAMIN C) 500 MG CHEW Chew 500 mg by mouth daily.     aspirin EC 81 MG tablet Take 81 mg by mouth at bedtime. Swallow whole.     atenolol  (TENORMIN ) 50 MG tablet Take 75 mg by mouth daily.       benazepril  (LOTENSIN ) 10 MG tablet Take 10 mg by mouth daily.     benzonatate (TESSALON) 100 MG capsule Take 100-200 mg by mouth at bedtime.     Biotin 1000 MCG tablet Take 1,000 mcg by mouth daily.     Calcium  Citrate-Vitamin D (CITRACAL + D PO) Take 1 tablet by mouth daily.     cetirizine (ZYRTEC) 10 MG tablet Take 10 mg by mouth at bedtime.     Cholecalciferol (VITAMIN D) 50 MCG (2000 UT) tablet Take 2,000 Units by mouth daily.     cyanocobalamin  1000 MCG tablet Take 1,000 mcg by mouth daily.     diclofenac sodium (VOLTAREN) 1 % GEL Apply 1 application  topically daily as needed (for pain).     dicyclomine (BENTYL) 10 MG capsule Take 1 capsule by mouth as needed.     diphenhydrAMINE  (BENADRYL ) 25 MG tablet Take 25 mg by mouth every 6 (six) hours as needed for allergies.     diphenoxylate-atropine (LOMOTIL) 2.5-0.025 MG tablet Take 2 tablets by mouth 4 (four) times daily as needed for diarrhea or loose stools.     DULoxetine  (CYMBALTA ) 60 MG capsule 2 capsules Orally once a day     famotidine  (PEPCID ) 20 MG tablet Take 20 mg by mouth at bedtime.     fexofenadine (ALLEGRA) 180 MG tablet Take 180 mg by mouth in the morning.  fluticasone  (FLONASE ) 50 MCG/ACT nasal spray Place 1 spray into both nostrils daily.     lidocaine  (XYLOCAINE ) 5 % ointment Apply 1 Application topically 2 (two) times daily as needed for moderate pain (pain score 4-6) (to the vulva). Apply to vulva as needed for discomfort 35.44 g 3   lidocaine  4 % Place 1 patch onto the skin daily.     LORazepam  (ATIVAN ) 1 MG tablet Take 1 tablet (1 mg total) by mouth every 8 (eight) hours. 30 tablet 0   NON FORMULARY Pt uses a c-pap nightly     Nutritional Supplements (JUICE PLUS FIBRE PO) Take 2 each by mouth daily. Fruits and Vegetables     nystatin  (MYCOSTATIN ) 100000 UNIT/ML suspension Take 5 mLs (500,000 Units total) by mouth 4 (four) times daily. 60 mL 2   nystatin  (MYCOSTATIN /NYSTOP ) powder Apply 1 Application topically 3  (three) times daily. 30 g 5   Omega-3 Fatty Acids (FISH OIL) 1000 MG CAPS Take 1,000 mg by mouth daily.      pantoprazole  (PROTONIX ) 40 MG tablet Take 40 mg by mouth every evening.     Phenylephrine -APAP-Guaifenesin (TYLENOL  SINUS SEVERE PO) Take 2 tablets by mouth daily as needed (drainage).     polyvinyl alcohol (LIQUIFILM TEARS) 1.4 % ophthalmic solution Place 1 drop into both eyes 5 (five) times daily.     Probiotic Product (PROBIOTIC & ACIDOPHILUS EX ST PO) Take 1 capsule by mouth daily. Ultra Flora Plus Capsules     Propylene Glycol (SYSTANE COMPLETE) 0.6 % SOLN Place 1 drop into both eyes in the morning and at bedtime.     rOPINIRole  (REQUIP ) 0.5 MG tablet Take 0.5 mg by mouth at bedtime.     senna-docusate (SENOKOT-S) 8.6-50 MG tablet Take 2 tablets by mouth at bedtime. For AFTER surgery, do not take if having diarrhea 30 tablet 0   simvastatin  (ZOCOR ) 10 MG tablet Take 10 mg by mouth at bedtime.       sucralfate  (CARAFATE ) 1 GM/10ML suspension Take 1 g by mouth 2 (two) times daily.     traZODone  (DESYREL ) 50 MG tablet Take 50 mg by mouth at bedtime.     triamcinolone  (KENALOG ) 0.025 % ointment Apply 1 Application topically 2 (two) times daily. 30 g 0   vitamin E 400 UNIT capsule Take 400 Units by mouth daily.       White Petrolatum -Mineral Oil (REFRESH P.M. OP) Place 1 Application into both eyes at bedtime.     EPINEPHrine  (EPIPEN  2-PAK) 0.3 mg/0.3 mL IJ SOAJ injection Inject 0.3 mg into the muscle as needed for anaphylaxis.     No current facility-administered medications for this visit.   Facility-Administered Medications Ordered in Other Visits  Medication Dose Route Frequency Provider Last Rate Last Admin   0.9 %  sodium chloride  infusion   Intravenous Continuous Fernando Torry A, NP 500 mL/hr at 12/06/23 1045 New Bag at 12/06/23 1045   cefTRIAXone  (ROCEPHIN ) 2 g in sodium chloride  0.9 % 100 mL IVPB  2 g Intravenous Q24H Elijahjames Fuelling A, NP

## 2023-12-06 NOTE — Patient Instructions (Signed)
 Ceftriaxone  Injection What is this medication? CEFTRIAXONE  (sef try AX one) treats infections caused by bacteria. It belongs to a group of medications called cephalosporin antibiotics. It will not treat colds, the flu, or infections caused by viruses. This medicine may be used for other purposes; ask your health care provider or pharmacist if you have questions. COMMON BRAND NAME(S): Ceftri-IM, Ceftrisol Plus, Rocephin  What should I tell my care team before I take this medication? They need to know if you have any of these conditions: Bleeding disorder High bilirubin level in newborn patients Kidney disease Liver disease Poor nutrition An unusual or allergic reaction to ceftriaxone , other penicillin or cephalosporin antibiotics, other medications, foods, dyes, or preservatives Pregnant or trying to get pregnant Breast-feeding How should I use this medication? This medication is injected into a vein or a muscle. It is usually given by your care team in a hospital or clinic setting. It may also be given at home. If you get this medication at home, you will be taught how to prepare and give it. Use exactly as directed. Take it as directed on the prescription label at the same time every day. Keep taking it even if you think you are better. It is important that you put your used needles and syringes in a special sharps container. Do not put them in a trash can. If you do not have a sharps container, call your pharmacist or care team to get one. Talk to your care team about the use of this medication in children. While it may be prescribed for children as young as newborns for selected conditions, precautions do apply. Overdosage: If you think you have taken too much of this medicine contact a poison control center or emergency room at once. NOTE: This medicine is only for you. Do not share this medicine with others. What if I miss a dose? If you get this medication at the hospital or clinic: It is  important not to miss your dose. Call your care team if you are unable to keep an appointment. If you give yourself this medication at home: If you miss a dose, take it as soon as you can. Then continue your normal schedule. If it is almost time for your next dose, take only that dose. Do not take double or extra doses. Call your care team with questions. What may interact with this medication? Estrogen or progestin hormones Intravenous calcium  This list may not describe all possible interactions. Give your health care provider a list of all the medicines, herbs, non-prescription drugs, or dietary supplements you use. Also tell them if you smoke, drink alcohol, or use illegal drugs. Some items may interact with your medicine. What should I watch for while using this medication? Tell your care team if your symptoms do not start to get better or if they get worse. Do not treat diarrhea with over the counter products. Contact your care team if you have diarrhea that lasts more than 2 days or if it is severe and watery. If you have diabetes, you may get a false-positive result for sugar in your urine. Check with your care team. If you are being treated for a sexually transmitted infection (STI), avoid sexual contact until you have finished your treatment. Your partner may also need treatment. What side effects may I notice from receiving this medication? Side effects that you should report to your care team as soon as possible: Allergic reactions--skin rash, itching, hives, swelling of the face, lips, tongue, or  throat Hemolytic anemia--unusual weakness or fatigue, dizziness, headache, trouble breathing, dark urine, yellowing skin or eyes Severe diarrhea, fever Unusual vaginal discharge, itching, or odor Side effects that usually do not require medical attention (report to your care team if they continue or are bothersome): Diarrhea Headache Nausea Pain, redness, or irritation at injection  site This list may not describe all possible side effects. Call your doctor for medical advice about side effects. You may report side effects to FDA at 1-800-FDA-1088. Where should I keep my medication? Keep out of the reach of children and pets. You will be instructed on how to store this medication. Get rid of any unused medication after the expiration date. To get rid of medications that are no longer needed or have expired: Take the medication to a medication take-back program. Check with your pharmacy or law enforcement to find a location. If you cannot return the medication, ask your pharmacist or care team how to get rid of this medication safely. NOTE: This sheet is a summary. It may not cover all possible information. If you have questions about this medicine, talk to your doctor, pharmacist, or health care provider.  2024 Elsevier/Gold Standard (2021-05-24 00:00:00)Dehydration, Adult Dehydration is a condition in which there is not enough water or other fluids in the body. This happens when a person loses more fluids than they take in. Important organs cannot work right without the right amount of fluids. Any loss of fluids from the body can cause dehydration. Dehydration can be mild, worse, or very bad. It should be treated right away to keep it from getting very bad. What are the causes? Conditions that cause loss of water in the body. They include: Watery poop (diarrhea). Vomiting. Sweating a lot. Fever. Infection. Peeing (urinating) a lot. Not drinking enough fluids. Certain medicines, such as medicines that take extra fluid out of the body (diuretics). Lack of safe drinking water. Not being able to get enough water and food. What increases the risk? Having a long-term (chronic) illness that has not been treated the right way, such as: Diabetes. Heart disease. Kidney disease. Being 76 years of age or older. Having a disability. Living in a place that is high above the  ground or sea (high in altitude). The thinner, drier air causes more fluid loss. Doing exercises that put stress on your body for a long time. Being active when in hot places. What are the signs or symptoms? Symptoms of dehydration depend on how bad it is. Mild or worse dehydration Thirst. Dry lips or dry mouth. Feeling dizzy or light-headed. Muscle cramps. Passing little pee or dark pee. Pee may be the color of tea. Headache. Very bad dehydration Changes in skin. Skin may: Be cold to the touch (clammy). Be blotchy or pale. Not go back to normal right after you pinch it and let it go. Little or no tears, pee, or sweat. Fast breathing. Low blood pressure. Weak pulse. Pulse that is more than 100 beats a minute when you are sitting still. Other changes, such as: Feeling very thirsty. Eyes that look hollow (sunken). Cold hands and feet. Being confused. Being very tired (lethargic) or having trouble waking from sleep. Losing weight. Loss of consciousness. How is this treated? Treatment for this condition depends on how bad your dehydration is. Treatment should start right away. Do not wait until your condition gets very bad. Very bad dehydration is an emergency. You will need to go to a hospital. Mild or worse dehydration can be  treated at home. You may be asked to: Drink more fluids. Drink an oral rehydration solution (ORS). This drink gives you the right amount of fluids, salts, and minerals (electrolytes). Very bad dehydration can be treated: With fluids through an IV tube. By correcting low levels of electrolytes in the body. By treating the problem that caused your dehydration. Follow these instructions at home: Oral rehydration solution If told by your doctor, drink an ORS: Make an ORS. Use instructions on the package. Start by drinking small amounts, about  cup (120 mL) every 5-10 minutes. Slowly drink more until you have had the amount that your doctor said to  have.  Eating and drinking  Drink enough clear fluid to keep your pee pale yellow. If you were told to drink an ORS, finish the ORS first. Then, start slowly drinking other clear fluids. Drink fluids such as: Water. Do not drink only water. Doing that can make the salt (sodium) level in your body get too low. Water from ice chips you suck on. Fruit juice that you have added water to (diluted). Low-calorie sports drinks. Eat foods that have the right amounts of salts and minerals, such as bananas, oranges, potatoes, tomatoes, or spinach. Do not drink alcohol. Avoid drinks that have caffeine or sugar. These include:: High-calorie sports drinks. Fruit juice that you did not add water to. Soda. Coffee or energy drinks. Avoid foods that are greasy or have a lot of fat or sugar. General instructions Take over-the-counter and prescription medicines only as told by your doctor. Do not take sodium tablets. Doing that can make the salt level in your body get too high. Return to your normal activities as told by your doctor. Ask your doctor what activities are safe for you. Keep all follow-up visits. Your doctor may check and change your treatment. Contact a doctor if: You have pain in your belly (abdomen) and the pain: Gets worse. Stays in one place. You have a rash. You have a stiff neck. You get angry or annoyed more easily than normal. You are more tired or have a harder time waking than normal. You feel weak or dizzy. You feel very thirsty. Get help right away if: You have any symptoms of very bad dehydration. You vomit every time you eat or drink. Your vomiting gets worse, does not go away, or you vomit blood or green stuff. You are getting treatment, but symptoms are getting worse. You have a fever. You have a very bad headache. You have: Diarrhea that gets worse or does not go away. Blood in your poop (stool). This may cause poop to look black and tarry. No pee in 6-8  hours. Only a small amount of pee in 6-8 hours, and the pee is very dark. You have trouble breathing. These symptoms may be an emergency. Get help right away. Call 911. Do not wait to see if the symptoms will go away. Do not drive yourself to the hospital. This information is not intended to replace advice given to you by your health care provider. Make sure you discuss any questions you have with your health care provider. Document Revised: 10/11/2021 Document Reviewed: 10/11/2021 Elsevier Patient Education  2024 [employer].

## 2023-12-07 ENCOUNTER — Ambulatory Visit (INDEPENDENT_AMBULATORY_CARE_PROVIDER_SITE_OTHER)
Admission: RE | Admit: 2023-12-07 | Discharge: 2023-12-07 | Disposition: A | Discharge status: Home / Self Care | Source: Ambulatory Visit | Attending: Hematology and Oncology | Admitting: Hematology and Oncology

## 2023-12-07 ENCOUNTER — Other Ambulatory Visit: Payer: Self-pay | Admitting: Pharmacist

## 2023-12-07 ENCOUNTER — Inpatient Hospital Stay (HOSPITAL_BASED_OUTPATIENT_CLINIC_OR_DEPARTMENT_OTHER): Admitting: Hematology and Oncology

## 2023-12-07 ENCOUNTER — Inpatient Hospital Stay

## 2023-12-07 ENCOUNTER — Other Ambulatory Visit: Payer: Self-pay

## 2023-12-07 ENCOUNTER — Other Ambulatory Visit (HOSPITAL_BASED_OUTPATIENT_CLINIC_OR_DEPARTMENT_OTHER): Admitting: Radiology

## 2023-12-07 ENCOUNTER — Telehealth: Payer: Self-pay | Admitting: *Deleted

## 2023-12-07 ENCOUNTER — Other Ambulatory Visit (HOSPITAL_BASED_OUTPATIENT_CLINIC_OR_DEPARTMENT_OTHER): Payer: Self-pay

## 2023-12-07 VITALS — BP 116/66 | HR 72 | Temp 98.6°F | Resp 16 | Ht 63.0 in | Wt 210.3 lb

## 2023-12-07 DIAGNOSIS — Z803 Family history of malignant neoplasm of breast: Secondary | ICD-10-CM | POA: Diagnosis not present

## 2023-12-07 DIAGNOSIS — C519 Malignant neoplasm of vulva, unspecified: Secondary | ICD-10-CM | POA: Diagnosis not present

## 2023-12-07 DIAGNOSIS — M8589 Other specified disorders of bone density and structure, multiple sites: Secondary | ICD-10-CM | POA: Diagnosis not present

## 2023-12-07 DIAGNOSIS — E860 Dehydration: Secondary | ICD-10-CM

## 2023-12-07 DIAGNOSIS — Z1502 Genetic susceptibility to malignant neoplasm of ovary: Secondary | ICD-10-CM | POA: Diagnosis not present

## 2023-12-07 DIAGNOSIS — Z1509 Genetic susceptibility to other malignant neoplasm: Secondary | ICD-10-CM | POA: Diagnosis not present

## 2023-12-07 MED ORDER — IOHEXOL 300 MG/ML  SOLN
100.0000 mL | Freq: Once | INTRAMUSCULAR | Status: AC | PRN
  Administered 2023-12-07: 100 mL via INTRAVENOUS

## 2023-12-07 MED ORDER — SODIUM CHLORIDE 0.9 % IV SOLN: Freq: Once | INTRAVENOUS | Status: AC

## 2023-12-07 MED ORDER — HEPARIN SOD (PORK) LOCK FLUSH 100 UNIT/ML IV SOLN
500.0000 [IU] | Freq: Once | INTRAVENOUS | Status: AC
  Administered 2023-12-07: 500 [IU] via INTRAVENOUS

## 2023-12-07 MED ORDER — OXYCODONE-ACETAMINOPHEN 5-325 MG PO TABS
1.0000 | ORAL_TABLET | ORAL | 0 refills | Status: DC | PRN
  Filled 2023-12-07: qty 90, 8d supply, fill #0

## 2023-12-07 MED ORDER — NYSTATIN 100000 UNIT/GM EX POWD
1.0000 | Freq: Three times a day (TID) | CUTANEOUS | 5 refills | Status: AC
  Filled 2023-12-07: qty 30, 10d supply, fill #0
  Filled 2024-02-23: qty 30, 10d supply, fill #1

## 2023-12-07 MED ORDER — SODIUM CHLORIDE 0.9 % IV SOLN
2.0000 g | Freq: Once | INTRAVENOUS | Status: AC
  Administered 2023-12-07: 2 g via INTRAVENOUS
  Filled 2023-12-07: qty 20

## 2023-12-07 MED FILL — Ceftriaxone Sodium For Inj 2 GM: INTRAMUSCULAR | Qty: 20 | Status: AC

## 2023-12-07 NOTE — Patient Instructions (Signed)
Ceftriaxone Injection What is this medication? CEFTRIAXONE (sef try AX one) treats infections caused by bacteria. It belongs to a group of medications called cephalosporin antibiotics. It will not treat colds, the flu, or infections caused by viruses. This medicine may be used for other purposes; ask your health care provider or pharmacist if you have questions. COMMON BRAND NAME(S): Ceftri-IM, Ceftrisol Plus, Rocephin What should I tell my care team before I take this medication? They need to know if you have any of these conditions: Bleeding disorder High bilirubin level in newborn patients Kidney disease Liver disease Poor nutrition An unusual or allergic reaction to ceftriaxone, other penicillin or cephalosporin antibiotics, other medications, foods, dyes, or preservatives Pregnant or trying to get pregnant Breast-feeding How should I use this medication? This medication is injected into a vein or a muscle. It is usually given by your care team in a hospital or clinic setting. It may also be given at home. If you get this medication at home, you will be taught how to prepare and give it. Use exactly as directed. Take it as directed on the prescription label at the same time every day. Keep taking it even if you think you are better. It is important that you put your used needles and syringes in a special sharps container. Do not put them in a trash can. If you do not have a sharps container, call your pharmacist or care team to get one. Talk to your care team about the use of this medication in children. While it may be prescribed for children as young as newborns for selected conditions, precautions do apply. Overdosage: If you think you have taken too much of this medicine contact a poison control center or emergency room at once. NOTE: This medicine is only for you. Do not share this medicine with others. What if I miss a dose? If you get this medication at the hospital or clinic: It is  important not to miss your dose. Call your care team if you are unable to keep an appointment. If you give yourself this medication at home: If you miss a dose, take it as soon as you can. Then continue your normal schedule. If it is almost time for your next dose, take only that dose. Do not take double or extra doses. Call your care team with questions. What may interact with this medication? Estrogen or progestin hormones Intravenous calcium This list may not describe all possible interactions. Give your health care provider a list of all the medicines, herbs, non-prescription drugs, or dietary supplements you use. Also tell them if you smoke, drink alcohol, or use illegal drugs. Some items may interact with your medicine. What should I watch for while using this medication? Tell your care team if your symptoms do not start to get better or if they get worse. Do not treat diarrhea with over the counter products. Contact your care team if you have diarrhea that lasts more than 2 days or if it is severe and watery. If you have diabetes, you may get a false-positive result for sugar in your urine. Check with your care team. If you are being treated for a sexually transmitted infection (STI), avoid sexual contact until you have finished your treatment. Your partner may also need treatment. What side effects may I notice from receiving this medication? Side effects that you should report to your care team as soon as possible: Allergic reactions--skin rash, itching, hives, swelling of the face, lips, tongue, or  throat Hemolytic anemia--unusual weakness or fatigue, dizziness, headache, trouble breathing, dark urine, yellowing skin or eyes Severe diarrhea, fever Unusual vaginal discharge, itching, or odor Side effects that usually do not require medical attention (report to your care team if they continue or are bothersome): Diarrhea Headache Nausea Pain, redness, or irritation at injection  site This list may not describe all possible side effects. Call your doctor for medical advice about side effects. You may report side effects to FDA at 1-800-FDA-1088. Where should I keep my medication? Keep out of the reach of children and pets. You will be instructed on how to store this medication. Get rid of any unused medication after the expiration date. To get rid of medications that are no longer needed or have expired: Take the medication to a medication take-back program. Check with your pharmacy or law enforcement to find a location. If you cannot return the medication, ask your pharmacist or care team how to get rid of this medication safely. NOTE: This sheet is a summary. It may not cover all possible information. If you have questions about this medicine, talk to your doctor, pharmacist, or health care provider.  2024 Elsevier/Gold Standard (2021-05-24 00:00:00)

## 2023-12-07 NOTE — Progress Notes (Signed)
 Doris Reyes Doris Reyes  [address] [place],  [place]  [address] [phone]  Clinic Day:  10/31/23  Referring physician: [name], MD  ASSESSMENT & PLAN:  Assessment: Invasive lobular carcinoma This was found on screening mammogram but the MRI reveals focal enhancement surrounding the biopsy clip up to 2.3 cm and there was non-mass enhancement posterior to this known malignancy on MRI.  This area was biopsied in order to guide her ultimate surgery.This is strongly ER/PR positive, HER2 negative and has a low Ki 67 of less than 5%.  She has now had a lumpectomy but had to go back for reexcision of the margins.  The sentinel lymph node was negative but she did have 2 primaries, measuring 2.4 cm and 2.2 cm, for a T2 N0 M0, stage IIA.  Even though she has several favorable characteristics, such as grade 1 histology and a low Ki 67, I still recommended that we pursue Endopredict testing to quantitate her risk for recurrence, and fortunately she has an EpClin score of 2.6, low risk.  This correlates with a 5.1% risk of distant recurrence in the next 10 years. Her benefit from chemotherapy would be 1.0% so we did not pursue. Her risk for a late recurrence is 4.0%. Unfortunately she has refused hormonal therapy.    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  She was treated with pelvic vulvectomy, bilateral debulking, radical lymphadenectomy, and inguinal dissection in April 2025.  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  There was lymphovascular invasion and carcinoma in situ at the 6 o'clock margin with < 1 mm margin for invasive carcinoma. PET scan revealed metastatic left inguinal lymph nodes up to 1.8 cm in diameter, but no additional evidence of metastatic disease  in the neck, chest, abdomen or pelvis. There was a 4 mm right lower lobe nodule, too small for PET resolution.    She was receiving concurrent chemoradiation with weekly cisplatin , as well as pembrolizuab every 3 weeks since her PDL 1 score is 85%, followed by maintenance pembrolizumab .  She had difficulty tolerating treatment due to nausea, vomiting and diarrhea, as well as hypomagnesemia.  We gave her additional IV fluids weekly and IV magnesium  as needed.  She developed mild pancytopenia felt to be most likely due to treatment.  After her third cycle of weekly cisplatin , she was hospitalized with sepsis due to recurrent cellulitis of the left vulva and thigh.  She then developed shingles of the left thigh.  She had pancytopenia at least in part due to chemotherapy.  At discharge on July 4, WBCs were 2.5, hemoglobin 8.7 and platelets 142,000. She was discharged on doxycycline and valacyclovir.  Due to the multiple issues with chemotherapy and radiation, she has declined taking any further therapy. She had about half of her treatment. I offered her continuation of immunotherapy but she declines that as well.    Osteopenia Her spine shows a T score of -2.6 for osteoporosis and the right femur has osteopenia.  At the very least she should be taking calcium  and vitamin D. Bone density scan done on 07/17/2023 revealed osteopenia.   Strong family history Including multiple females with breast cancer, her mother had colon cancer at age 28, and another maternal aunt had ovarian cancer.  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  She has been counseled on the implications of this and is already getting regular colonoscopy.  However the information will also be passed on to family members to pursue testing.   Plan: She presents to clinic today for follow up symptom management. She developed cellulitis of the left vulva that spread down to her thigh and became septic.  This was improving; however last night she notes a fever of 101 and increasing discomfort to the area. She states it felt like she may be having a shingles outbreak. On exam, no blisters, weeping or discharge was noted, although the left inner thigh, bilateral vulva, mons and lower abdomen remain quite red and hot to touch. I have obtained blood cultures x 2. She received IV Rocephin  here in the clinic yesterday and will receive doses today and Friday. I will then continue with oral Keflex . I have referred to Infectious Disease and will obtain CT abdomen/pelvis today. She will also follow with Dr. Viktoria for further evaluation of her vulvar cancer. We will continue supportive care and I will see her back in 1 weeks with CBC, CMP, and magnesium . The patient understands the plans discussed today and is in agreement with them.  She knows to contact our office if she develops concerns prior to her next appointment.  I provided 30 minutes of face-to-face time during this encounter and > 50% was spent counseling as documented under my assessment and plan.   Doris Bach, FNP - Board Certified [facility] - A DEPT OF [facility] [address] [place] [address] Dept: [phone] Dept Fax: [phone]   Orders Placed This Encounter  Procedures   CT ABDOMEN PELVIS W CONTRAST    Standing Status:   Future    Number of Occurrences:   1    Expected Date:   12/07/2023    Expiration Date:   12/06/2024    If indicated for the ordered procedure, I authorize the administration of contrast media per Radiology protocol:   Yes    Does the patient have a contrast media/X-ray dye allergy?:   No    Preferred imaging location?:   [facility]    If indicated for the ordered procedure, I authorize the administration of oral contrast media per Radiology protocol:   Yes   Ambulatory referral to Infectious Disease    Referral Priority:   Routine     Referral Type:   Consultation    Referral Reason:   Specialty Services Required    Requested Specialty:   Infectious Diseases    Number of Visits Requested:   1    CHIEF COMPLAINT:  CC: Stage IIA Breast cancer  Current Treatment: IV ceftriaxone   HISTORY OF PRESENT ILLNESS:  Doris Reyes 73 y.o. 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  She does have annual mammograms because of her strong family history, but was on hormone replacement therapy for 10 years.  She had a screening mammogram at [facility] on May 05, 2021 which revealed architectural distortion which was felt to be indeterminant.  She had a diagnostic mammogram on March 3 which showed mild nipple retraction and a 1 cm area of focal asymmetry in the retroareolar region.  Ultrasound revealed a hypoechoic irregular mass measuring 9 mm in that area.  On March 14 she  had a biopsy performed which revealed a 0.9 cm irregular mass with spiculated margins in the right breast at 12:00, approximately 2 cm from the nipple and anteriorly in the breast.  This was found to be grade 2 invasive mammary carcinoma as well as mammary carcinoma in situ.  The E-cadherin immunohistochemistry stains were negative and so this is consistent with invasive lobular carcinoma.  The estrogen receptors were positive at 95% and progesterone receptors positive at 95% with HER2 by immunohistochemistry positive at 2+ but FISH was negative.  Ki-67 was less than 5%.  The carcinoma in situ was positive for E-cadherin, consistent with ductal carcinoma in situ.  She had an MRI of the breast and was found to have focal enhancement in the right breast surrounding the biopsy clip and some non-mass enhancement posterior to the malignancy.  It was recommended that she have an MRI guided biopsy of the clumped non-mass enhancement area and this revealed invasive lobular carcinoma grade 1 with lobular  carcinoma in situ and extensive fibrocystic changes with focal intraluminal microcalcification and intraductal papillomatosis.   Due to her strong family history including multiple females with breast cancer in her mother with colon cancer at age 55, she did have genetic testing with [facility] and was found to have a carrier mutation of MUTYH.  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  It was recommended that she have colonoscopy beginning at age 32 or 10 years prior to the age of her first-degree use relatives age at colorectal cancer diagnosis.  It was recommended this be repeated every 5 years.  This was explained to her.   She has now had a lumpectomy but had to go back for reexcision of the margins.  The sentinel lymph node was negative but she did have 2 primaries, measuring 2.4 cm and 2.2 cm, for a T2 N0 M0, stage IIA. This was ER/PR positive, HER 2 negative and a Ki-67 was 5%.  Even though she has several favorable characteristics, such as grade 1 histology and a low Ki 67, I still recommended that we pursue Endopredict testing to quantitate her risk for recurrence, and fortunately she has an EpClin score of 2.6, low risk. This correlates with a 5.1% risk of distant recurrence in the next 10 years, with only a 1.0% benefit of chemotherapy. Her risk of late recurrence is 4.0%. I recommended hormonal therapy but she refused.   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 PET scan revealed metastatic left inguinal lymph nodes up to 1.8 cm in diameter, but no additional evidence of metastatic disease in the neck, chest, abdomen or pelvis. There was a 4 mm right lower lobe nodule, too small for PET resolution.  Patient had a pelvic vulvectomy, bilateral debulking, radical lymphadenectomy, and inguinal dissection done on  06/28/2023.  Pathology revealed 3/4 positive nodes on the left side, up to 3 cm and with extensive extranodal tumor, and negative nodes on the right side. The vulvar carcinoma measured 3 cm with depth of invasion 5 mm and carcinoma in situ, for a T1b N2c M0. She had lymphovascular invasion and carcinoma in situ at the 6 o'clock margin with < 1 mm margin for invasive carcinoma.Diagnostic bilateral mammogram done on 05/04/2023 was clear. Bone density scan done on 07/17/2023 revealed osteopenia.  Oncology History  Cancer of central  portion of right breast (HCC)  06/08/2021 Initial Diagnosis   Breast cancer in female [facility])   07/23/2021 Genetic Testing   Negative hereditary cancer genetic testing: no pathogenic variants detected in [facility] Panel. Report date is July 23, 2021.  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  The report date is Jul 26, 2021.  The BRCAplus panel offered by [employer] and includes sequencing and deletion/duplication analysis for the following 8 genes: ATM, BRCA1, BRCA2, CDH1, CHEK2, PALB2, PTEN, and TP53.  Results of pan-cancer panel pending.   The CancerNext-Expanded gene panel offered by [facility] and includes sequencing and rearrangement analysis for the following 77 genes: AIP, ALK, APC*, ATM*, AXIN2, BAP1, BARD1, BLM, BMPR1A, BRCA1*, BRCA2*, BRIP1*, CDC73, CDH1*, CDK4, CDKN1B, CDKN2A, CHEK2*, CTNNA1, DICER1, FANCC, FH, FLCN, GALNT12, KIF1B, LZTR1, MAX, MEN1, MET, MLH1*, MSH2*, MSH3, MSH6*, MUTYH*, NBN, NF1*, NF2, NTHL1, PALB2*, PHOX2B, PMS2*, POT1, PRKAR1A, PTCH1, PTEN*, RAD51C*, RAD51D*, RB1, RECQL, RET, SDHA, SDHAF2, SDHB, SDHC, SDHD, SMAD4, SMARCA4, SMARCB1, SMARCE1, STK11, SUFU, TMEM127, TP53*, TSC1, TSC2, VHL and XRCC2 (sequencing and deletion/duplication); EGFR, EGLN1, HOXB13, KIT, MITF, PDGFRA, POLD1, and POLE (sequencing  only); EPCAM and GREM1 (deletion/duplication only). DNA and RNA analyses performed for * genes.    08/23/2021 Cancer Staging   Staging form: Breast, AJCC 8th Edition - Pathologic stage from 08/23/2021: Stage IA (pT2(2), pN0(sn), cM0, G1, ER+, PR+, HER2-) - Signed by Cornelius Wanda DEL, MD on 09/29/2021 Histopathologic type: Lobular carcinoma, NOS Stage prefix: Initial diagnosis Method of lymph node assessment: Sentinel lymph node biopsy Nuclear grade: G1 Multigene prognostic tests performed: EndoPredict Histologic grading system: 3 grade system Residual tumor (R): R0 - None Laterality: Right Tumor size (mm): 24 Multiple tumors: Yes Number of tumors: 2 Lymph-vascular invasion (LVI): LVI not present (absent)/not identified Diagnostic confirmation: Positive histology PLUS positive immunophenotyping and/or positive genetic studies Specimen type: Excision Staged by: Managing physician Menopausal status: Postmenopausal Ki-67 (%): 5 Stage used in treatment planning: Yes National guidelines used in treatment planning: Yes Type of national guideline used in treatment planning: NCCN   Vulvar cancer (HCC)  06/28/2023 Initial Diagnosis   Vulvar cancer (HCC)   06/28/2023 Cancer Staging   Staging form: Vulva, AJCC V9 - Clinical stage from 06/28/2023: FIGO Stage IIIC (cT1b, cN1c, cM0) - Signed by Cornelius Wanda DEL, MD on 07/31/2023 Histopathologic type: Squamous cell carcinoma, NOS Stage prefix: Initial diagnosis Method of lymph node assessment: Lymph node dissection Histologic grade (G): G2 Histologic grading system: 3 grade system Tumor size (mm): 30 Lymph-vascular invasion (LVI): LVI present/identified, NOS Diagnostic confirmation: Positive histology Specimen type: Excision Staged by: Managing physician Femoral-inguinal nodal status: Positive Solitary (s) or multifocal (m) tumors in the primary site: Solitary Perineural invasion (PNI): Unknown Stage used in treatment planning:  Yes National guidelines used in treatment planning: Yes Type of national guideline used in treatment planning: NCCN   09/04/2023 - 09/18/2023 Chemotherapy   Patient is on Treatment Plan : CERVICAL Pembrolizumab  q21d + XRT (cisplatin  d/c'd 09/25/23)     12/22/2023 - 12/22/2023 Chemotherapy   Patient is on Treatment Plan : CERVICAL Pembrolizumab  (200) q21d      INTERVAL HISTORY:  Lekha is here today for repeat clinical assessment for her Breast cancer, stage IIA breast cancer. Patient states that she feels well and has no complaint of pain. She has 2 weeks left of her Keflex .  During physical exam I noticed a dark keratosis on  her right anterior lower tibia. Patient states that this has been there for years and has gradually gotten larger. I recommended she go to dermatology at some point to further evaluate this. We will also have her reevaluated by Dr. Viktoria gyn-oncology in the Fall. She has continued cellulitis to the left inner thigh, bilateral vula, mons and lower abdomen. We re-discussed the option of immunotherapy and patient states that she declines to take any further therapy. We will continue supportive care and I will see her back in 2 weeks with CBC, CMP, and magnesium .    She denies fever, chills, night sweats, or other signs of infection. She denies cardiorespiratory and gastrointestinal issues. She  denies pain. Her appetite is good and Her weight has been stable.    REVIEW OF SYSTEMS:   Review of Systems  Constitutional:  Negative for appetite change, chills, diaphoresis, fatigue, fever and unexpected weight change.  HENT:  Negative.  Negative for lump/mass, mouth sores and sore throat.   Eyes:  Positive for eye problems.  Respiratory: Negative.  Negative for chest tightness, cough, hemoptysis, shortness of breath and wheezing.   Cardiovascular: Negative.  Negative for chest pain, leg swelling and palpitations.  Gastrointestinal: Negative.  Negative for abdominal distention,  abdominal pain, blood in stool, constipation, diarrhea, nausea and vomiting.  Endocrine: Negative.   Genitourinary: Negative.  Negative for difficulty urinating, dysuria, frequency, hematuria, vaginal bleeding and vaginal discharge.        Mild erythema of the genitalia   Musculoskeletal:  Negative for arthralgias, back pain, flank pain, gait problem and myalgias.  Skin:  Positive for rash.  Neurological:  Negative for dizziness, extremity weakness, gait problem, headaches, light-headedness, numbness, seizures and speech difficulty.  Hematological: Negative.  Negative for adenopathy. Does not bruise/bleed easily.  Psychiatric/Behavioral: Negative.  Negative for depression and sleep disturbance. The patient is not nervous/anxious.    VITALS:   Vitals:   12/07/23 1100  BP: 116/66  Pulse: 72  Resp: 16  Temp: 98.6 F (37 C)  SpO2: 98%    Wt Readings from Last 3 Encounters:  12/07/23 210 lb 4.8 oz (95.4 kg)  12/06/23 210 lb 4.8 oz (95.4 kg)  11/28/23 209 lb 6.4 oz (95 kg)    Body mass index is 37.25 kg/m.  Performance status (ECOG): 1 - Symptomatic but completely ambulatory  PHYSICAL EXAM:  Physical Exam Vitals and nursing note reviewed.  Constitutional:      General: She is not in acute distress.    Appearance: Normal appearance. She is normal weight. She is not ill-appearing.  HENT:     Head: Normocephalic and atraumatic.     Right Ear: Tympanic membrane, ear canal and external ear normal. There is no impacted cerumen.     Left Ear: Tympanic membrane, ear canal and external ear normal. There is no impacted cerumen.     Nose: Nose normal. No congestion or rhinorrhea.     Mouth/Throat:     Mouth: Mucous membranes are moist.     Pharynx: Oropharynx is clear. No oropharyngeal exudate or posterior oropharyngeal erythema.  Eyes:     General: No scleral icterus.       Right eye: No discharge.        Left eye: No discharge.     Extraocular Movements: Extraocular movements  intact.     Conjunctiva/sclera: Conjunctivae normal.     Pupils: Pupils are equal, round, and reactive to light.  Cardiovascular:     Rate and Rhythm: Normal rate  and regular rhythm.     Pulses: Normal pulses.     Heart sounds: Normal heart sounds. No murmur heard.    No friction rub. No gallop.  Pulmonary:     Effort: Pulmonary effort is normal. No respiratory distress.     Breath sounds: Normal breath sounds. No stridor. No wheezing, rhonchi or rales.  Abdominal:     General: Bowel sounds are normal. There is no distension.     Palpations: Abdomen is soft. There is no hepatomegaly, splenomegaly or mass.     Tenderness: There is no abdominal tenderness. There is no right CVA tenderness, left CVA tenderness, guarding or rebound.     Hernia: No hernia is present.  Genitourinary:    Comments: Genitalia has some mild pink coloration No exudate Musculoskeletal:        General: Normal range of motion.     Cervical back: Normal range of motion and neck supple. No tenderness.     Right lower leg: No edema.     Left lower leg: No edema.  Lymphadenopathy:     Cervical: No cervical adenopathy.     Right cervical: No superficial, deep or posterior cervical adenopathy.    Left cervical: No superficial, deep or posterior cervical adenopathy.     Upper Body:     Right upper body: No supraclavicular, axillary or pectoral adenopathy.     Left upper body: No supraclavicular, axillary or pectoral adenopathy.  Skin:    General: Skin is warm and dry.     Coloration: Skin is not jaundiced.     Findings: Erythema and lesion present. No rash.     Comments: Large area of redness, warm to touch involving the left inner thigh, vulva, mons and lower abdomen.   Neurological:     General: No focal deficit present.     Mental Status: She is alert and oriented to person, place, and time. Mental status is at baseline.     Cranial Nerves: No cranial nerve deficit.  Psychiatric:        Mood and Affect: Mood  normal.        Behavior: Behavior normal.        Thought Content: Thought content normal.        Judgment: Judgment normal.    LABS:      Latest Ref Rng & Units 12/06/2023    9:27 AM 11/28/2023   10:35 AM 11/14/2023   10:09 AM  CBC  WBC 4.0 - 10.5 K/uL 9.7  4.5  3.9   Hemoglobin 12.0 - 15.0 g/dL 89.7  89.2  9.5   Hematocrit 36.0 - 46.0 % 29.7  32.7  29.1   Platelets 150 - 400 K/uL 135  178  171       Latest Ref Rng & Units 12/06/2023    9:27 AM 11/28/2023   10:35 AM 11/14/2023   10:09 AM  CMP  Glucose 70 - 99 mg/dL 887  99  97   BUN 8 - 23 mg/dL 18  20  22    Creatinine 0.44 - 1.00 mg/dL 8.81  8.83  8.95   Sodium 135 - 145 mmol/L 135  139  139   Potassium 3.5 - 5.1 mmol/L 3.5  4.4  4.1   Chloride 98 - 111 mmol/L 100  104  105   CO2 22 - 32 mmol/L 22  23  23    Calcium  8.9 - 10.3 mg/dL 9.2  9.5  9.4   Total Protein 6.5 -  8.1 g/dL 6.2  6.4  6.2   Total Bilirubin 0.0 - 1.2 mg/dL 0.5  0.3  0.2   Alkaline Phos 38 - 126 U/L 73  73  64   AST 15 - 41 U/L 19  22  21    ALT 0 - 44 U/L 19  18  24     Lab Results  Component Value Date   TSH 0.815 10/25/2023   T4TOTAL 7.9 10/25/2023   Lab Results  Component Value Date   TIBC 441 10/11/2023   FERRITIN 65 10/11/2023   IRONPCTSAT 14 10/11/2023     STUDIES:  EXAM: 07/17/2023 DUAL X-RAY ABSORPTIOMETRY (DXA) FOR BONE MINERAL DENSITY LUMBAR SPINE (L2-L4): BMD (in g/cm2): 0.912 T-score: -2.4 Z-score: -0.7   LEFT FEMORAL NECK: BMD (in g/cm2): 0.793 T-score: -1.8 Z-score: 0.0   LEFT TOTAL HIP: BMD (in g/cm2): 0.922 T-score: -0.7 Z-score: 0.9   RIGHT FEMORAL NECK: BMD (in g/cm2): 0.855 T-score: -1.3 Z-score: 0.5   RIGHT TOTAL HIP: BMD (in g/cm2): 0.896 T-score: -0.9 Z-score: 0.7   EXAM: 06/05/2023 NUCLEAR MEDICINE PET SKULL BASE TO THIGH IMPRESSION: 1. Metastatic left inguinal lymph nodes. No additional evidence of metastatic disease in the neck, chest, abdomen or pelvis. 2. 4 mm right lower lobe nodule, too small for  PET resolution. Recommend attention on follow-up. 3. Small to moderate hiatal hernia. 4.  Aortic atherosclerosis (ICD10-I70.0).  HISTORY:   Past Medical History:  Diagnosis Date   Anemia    Anxiety    Cancer (HCC)    right breast ILC   Complication of anesthesia    difficulty waking up   Depression    Diverticulitis    Family history of breast cancer    Family history of ovarian cancer    Family history of prostate cancer    Fibromyalgia    GERD (gastroesophageal reflux disease)    Heart murmur    History of colonic polyps    History of hiatal hernia    Hyperlipidemia    Hypertension    IBS (irritable bowel syndrome)    Ischemic colitis (HCC) 03/2012   Osteoporosis    Pre-diabetes    RLS (restless legs syndrome)    Sinusitis    Sleep apnea    CPAP nightly    Past Surgical History:  Procedure Laterality Date   BREAST LUMPECTOMY WITH RADIOACTIVE SEED LOCALIZATION Right 08/04/2021   Procedure: RIGHT BREAST LUMPECTOMY WITH RADIOACTIVE SEED LOCALIZATION;  Surgeon: Vanderbilt Ned, MD;  Location: [facility];  Service: General;  Laterality: Right;   CHOLECYSTECTOMY  2004   COLONOSCOPY WITH PROPOFOL  N/A 04/18/2017   Procedure: COLONOSCOPY WITH PROPOFOL ;  Surgeon: Kristie Lamprey, MD;  Location: [facility];  Service: Endoscopy;  Laterality: N/A;   ESOPHAGOGASTRODUODENOSCOPY (EGD) WITH PROPOFOL  N/A 04/18/2017   Procedure: ESOPHAGOGASTRODUODENOSCOPY (EGD) WITH PROPOFOL ;  Surgeon: Kristie Lamprey, MD;  Location: [facility];  Service: Endoscopy;  Laterality: N/A;   EXAM UNDER ANESTHESIA, PELVIC N/A 06/28/2023   Procedure: EXAM UNDER ANESTHESIA, PELVIC;  Surgeon: Doris Reyes Comer SAUNDERS, MD;  Location: [facility];  Service: Gynecology;  Laterality: N/A;   FLEXIBLE SIGMOIDOSCOPY  04/20/2012   Procedure: FLEXIBLE SIGMOIDOSCOPY;  Surgeon: Belvie JONETTA Just, MD;  Location: [facility];  Service: Endoscopy;  Laterality: N/A;   INGUINAL LYMPHADENECTOMY N/A 06/28/2023   Procedure: LYMPHADENECTOMY, INGUINAL,  OPEN;  Surgeon: Doris Reyes Comer SAUNDERS, MD;  Location: [facility];  Service: Gynecology;  Laterality: N/A;   IR IMAGING GUIDED PORT INSERTION  07/31/2023   NISSEN FUNDOPLICATION  2011   PARAESOPHAGEAL  HERNIA REPAIR  09/11/2009   and Nissen fundoplication   RADICAL VULVECTOMY N/A 06/28/2023   Procedure: VULVECTOMY, RADICAL;  Surgeon: Doris Reyes Comer SAUNDERS, MD;  Location: [facility];  Service: Gynecology;  Laterality: N/A;  Possible skin flap   RE-EXCISION OF BREAST LUMPECTOMY Right 08/18/2021   Procedure: RE-EXCISION RIGHT BREAST LUMPECTOMY;  Surgeon: Vanderbilt Ned, MD;  Location: [facility];  Service: General;  Laterality: Right;   SENTINEL NODE BIOPSY N/A 08/04/2021   Procedure: SENTINEL NODE BIOPSY;  Surgeon: Vanderbilt Ned, MD;  Location: [facility];  Service: General;  Laterality: N/A;   TONSILLECTOMY  1960's   TOTAL ABDOMINAL HYSTERECTOMY  2001   w/ BSO    Family History  Problem Relation Age of Onset   Colon cancer Mother 68   Alzheimer's disease Mother    Heart attack Father    Cancer Maternal Aunt        x2   Alzheimer's disease Maternal Aunt    Ovarian cancer Maternal Aunt    Prostate cancer Maternal Uncle    Kidney disease Maternal Uncle    Atrial fibrillation Maternal Uncle    Stroke Paternal Uncle    Aneurysm Paternal Grandmother        brain   Stroke Paternal Grandfather    Prostate cancer Cousin        paternal first cousin   Prostate cancer Cousin        paternal first cousin   Esophageal cancer Cousin        maternal first cousin   Breast cancer Cousin        DCIS, maternal first cousin   Ovarian cancer Cousin        maternal first cousin   Rheum arthritis Other    Lung disease Neg Hx     Social History:  reports that she has never smoked. She has never used smokeless tobacco. She reports that she does not drink alcohol and does not use drugs.The patient is alone today.  Allergies:  Allergies  Allergen Reactions   Cefuroxime Other (See Comments),  Diarrhea and Nausea Only    IBS   Sulfa  Antibiotics Nausea And Vomiting and Nausea Only   Tramadol Other (See Comments) and Nausea Only   Ceftin [Cefuroxime Axetil] Diarrhea   Compazine  [Prochlorperazine ] Other (See Comments)    Vision problems   Ondansetron  Hcl    Other Other (See Comments)    Pneumonia vaccine (uncoded)   Tramadol Hcl Nausea And Vomiting   Zofran  [Ondansetron ] Other (See Comments)    Vision changes    Current Medications: Current Outpatient Medications  Medication Sig Dispense Refill   oxyCODONE -acetaminophen  (PERCOCET/ROXICET) 5-325 MG tablet Take 1-2 tablets by mouth every 4 (four) hours as needed for severe pain (pain score 7-10). 90 tablet 0   acetaminophen  (TYLENOL ) 650 MG CR tablet 1,300 mg in the morning and at bedtime.     amLODipine  (NORVASC ) 5 MG tablet Take 5 mg by mouth daily.     Ascorbic Acid (VITAMIN C) 500 MG CHEW Chew 500 mg by mouth daily.     aspirin EC 81 MG tablet Take 81 mg by mouth at bedtime. Swallow whole.     atenolol  (TENORMIN ) 50 MG tablet Take 75 mg by mouth daily.      benazepril  (LOTENSIN ) 10 MG tablet Take 10 mg by mouth daily.     benzonatate (TESSALON) 100 MG capsule Take 100-200 mg by mouth at bedtime.     Biotin 1000 MCG tablet Take 1,000  mcg by mouth daily.     Calcium  Citrate-Vitamin D (CITRACAL + D PO) Take 1 tablet by mouth daily.     cetirizine (ZYRTEC) 10 MG tablet Take 10 mg by mouth at bedtime.     Cholecalciferol (VITAMIN D) 50 MCG (2000 UT) tablet Take 2,000 Units by mouth daily.     cyanocobalamin  1000 MCG tablet Take 1,000 mcg by mouth daily.     diclofenac sodium (VOLTAREN) 1 % GEL Apply 1 application  topically daily as needed (for pain).     dicyclomine (BENTYL) 10 MG capsule Take 1 capsule by mouth as needed.     diphenhydrAMINE  (BENADRYL ) 25 MG tablet Take 25 mg by mouth every 6 (six) hours as needed for allergies.     diphenoxylate-atropine (LOMOTIL) 2.5-0.025 MG tablet Take 2 tablets by mouth 4 (four) times  daily as needed for diarrhea or loose stools.     DULoxetine  (CYMBALTA ) 60 MG capsule 2 capsules Orally once a day     EPINEPHrine  (EPIPEN  2-PAK) 0.3 mg/0.3 mL IJ SOAJ injection Inject 0.3 mg into the muscle as needed for anaphylaxis.     famotidine  (PEPCID ) 20 MG tablet Take 20 mg by mouth at bedtime.     fexofenadine (ALLEGRA) 180 MG tablet Take 180 mg by mouth in the morning.     fluticasone  (FLONASE ) 50 MCG/ACT nasal spray Place 1 spray into both nostrils daily.     lidocaine  (XYLOCAINE ) 5 % ointment Apply 1 Application topically 2 (two) times daily as needed for moderate pain (pain score 4-6) (to the vulva). Apply to vulva as needed for discomfort 35.44 g 3   lidocaine  4 % Place 1 patch onto the skin daily.     LORazepam  (ATIVAN ) 1 MG tablet Take 1 tablet (1 mg total) by mouth every 8 (eight) hours. 30 tablet 0   NON FORMULARY Pt uses a c-pap nightly     Nutritional Supplements (JUICE PLUS FIBRE PO) Take 2 each by mouth daily. Fruits and Vegetables     nystatin  (MYCOSTATIN ) 100000 UNIT/ML suspension Take 5 mLs (500,000 Units total) by mouth 4 (four) times daily. 60 mL 2   nystatin  (MYCOSTATIN /NYSTOP ) powder Apply 1 Application topically 3 (three) times daily. 30 g 5   Omega-3 Fatty Acids (FISH OIL) 1000 MG CAPS Take 1,000 mg by mouth daily.      pantoprazole  (PROTONIX ) 40 MG tablet Take 40 mg by mouth every evening.     Phenylephrine -APAP-Guaifenesin (TYLENOL  SINUS SEVERE PO) Take 2 tablets by mouth daily as needed (drainage).     polyvinyl alcohol (LIQUIFILM TEARS) 1.4 % ophthalmic solution Place 1 drop into both eyes 5 (five) times daily.     Probiotic Product (PROBIOTIC & ACIDOPHILUS EX ST PO) Take 1 capsule by mouth daily. Ultra Flora Plus Capsules     Propylene Glycol (SYSTANE COMPLETE) 0.6 % SOLN Place 1 drop into both eyes in the morning and at bedtime.     rOPINIRole  (REQUIP ) 0.5 MG tablet Take 0.5 mg by mouth at bedtime.     senna-docusate (SENOKOT-S) 8.6-50 MG tablet Take 2 tablets  by mouth at bedtime. For AFTER surgery, do not take if having diarrhea 30 tablet 0   simvastatin  (ZOCOR ) 10 MG tablet Take 10 mg by mouth at bedtime.       sucralfate  (CARAFATE ) 1 GM/10ML suspension Take 1 g by mouth 2 (two) times daily.     traZODone  (DESYREL ) 50 MG tablet Take 50 mg by mouth at bedtime.  triamcinolone  (KENALOG ) 0.025 % ointment Apply 1 Application topically 2 (two) times daily. 30 g 0   vitamin E 400 UNIT capsule Take 400 Units by mouth daily.       White Petrolatum -Mineral Oil (REFRESH P.M. OP) Place 1 Application into both eyes at bedtime.     No current facility-administered medications for this visit.   Facility-Administered Medications Ordered in Other Visits  Medication Dose Route Frequency Provider Last Rate Last Admin   heparin  lock flush 100 unit/mL  500 Units Intravenous Once Walker Sitar A, NP       iohexol  (OMNIPAQUE ) 300 MG/ML solution 100 mL  100 mL Intravenous Once PRN Jaxsen Bernhart A, NP

## 2023-12-07 NOTE — Telephone Encounter (Signed)
 Per DR Viktoria patient scheduled for an appt in 2 weeks. Per patient request appt scheduled for 10/2. MD notified of appt

## 2023-12-08 ENCOUNTER — Other Ambulatory Visit: Payer: Self-pay | Admitting: Hematology and Oncology

## 2023-12-08 ENCOUNTER — Other Ambulatory Visit (HOSPITAL_BASED_OUTPATIENT_CLINIC_OR_DEPARTMENT_OTHER): Payer: Self-pay

## 2023-12-08 ENCOUNTER — Inpatient Hospital Stay

## 2023-12-08 VITALS — BP 129/69 | HR 53 | Temp 97.9°F | Resp 18

## 2023-12-08 DIAGNOSIS — Z803 Family history of malignant neoplasm of breast: Secondary | ICD-10-CM | POA: Diagnosis not present

## 2023-12-08 DIAGNOSIS — Z1509 Genetic susceptibility to other malignant neoplasm: Secondary | ICD-10-CM | POA: Diagnosis not present

## 2023-12-08 DIAGNOSIS — C519 Malignant neoplasm of vulva, unspecified: Secondary | ICD-10-CM | POA: Diagnosis not present

## 2023-12-08 DIAGNOSIS — E860 Dehydration: Secondary | ICD-10-CM

## 2023-12-08 DIAGNOSIS — M8589 Other specified disorders of bone density and structure, multiple sites: Secondary | ICD-10-CM | POA: Diagnosis not present

## 2023-12-08 DIAGNOSIS — Z1502 Genetic susceptibility to malignant neoplasm of ovary: Secondary | ICD-10-CM | POA: Diagnosis not present

## 2023-12-08 MED ORDER — AMOXICILLIN 500 MG PO CAPS
500.0000 mg | ORAL_CAPSULE | Freq: Two times a day (BID) | ORAL | 0 refills | Status: DC
  Filled 2023-12-08: qty 14, 7d supply, fill #0

## 2023-12-08 MED ORDER — SODIUM CHLORIDE 0.9 % IV SOLN
2.0000 g | Freq: Once | INTRAVENOUS | Status: AC
  Administered 2023-12-08: 2 g via INTRAVENOUS
  Filled 2023-12-08: qty 20

## 2023-12-08 MED ORDER — SODIUM CHLORIDE 0.9 % IV SOLN: INTRAVENOUS | Status: DC

## 2023-12-08 MED ORDER — AMOXICILLIN-POT CLAVULANATE 875-125 MG PO TABS
1.0000 | ORAL_TABLET | Freq: Two times a day (BID) | ORAL | 0 refills | Status: DC
  Filled 2023-12-08: qty 14, 7d supply, fill #0

## 2023-12-08 NOTE — Progress Notes (Signed)
 amoxi

## 2023-12-08 NOTE — Patient Instructions (Signed)
Ceftriaxone Injection What is this medication? CEFTRIAXONE (sef try AX one) treats infections caused by bacteria. It belongs to a group of medications called cephalosporin antibiotics. It will not treat colds, the flu, or infections caused by viruses. This medicine may be used for other purposes; ask your health care provider or pharmacist if you have questions. COMMON BRAND NAME(S): Ceftri-IM, Ceftrisol Plus, Rocephin What should I tell my care team before I take this medication? They need to know if you have any of these conditions: Bleeding disorder High bilirubin level in newborn patients Kidney disease Liver disease Poor nutrition An unusual or allergic reaction to ceftriaxone, other penicillin or cephalosporin antibiotics, other medications, foods, dyes, or preservatives Pregnant or trying to get pregnant Breast-feeding How should I use this medication? This medication is injected into a vein or a muscle. It is usually given by your care team in a hospital or clinic setting. It may also be given at home. If you get this medication at home, you will be taught how to prepare and give it. Use exactly as directed. Take it as directed on the prescription label at the same time every day. Keep taking it even if you think you are better. It is important that you put your used needles and syringes in a special sharps container. Do not put them in a trash can. If you do not have a sharps container, call your pharmacist or care team to get one. Talk to your care team about the use of this medication in children. While it may be prescribed for children as young as newborns for selected conditions, precautions do apply. Overdosage: If you think you have taken too much of this medicine contact a poison control center or emergency room at once. NOTE: This medicine is only for you. Do not share this medicine with others. What if I miss a dose? If you get this medication at the hospital or clinic: It is  important not to miss your dose. Call your care team if you are unable to keep an appointment. If you give yourself this medication at home: If you miss a dose, take it as soon as you can. Then continue your normal schedule. If it is almost time for your next dose, take only that dose. Do not take double or extra doses. Call your care team with questions. What may interact with this medication? Estrogen or progestin hormones Intravenous calcium This list may not describe all possible interactions. Give your health care provider a list of all the medicines, herbs, non-prescription drugs, or dietary supplements you use. Also tell them if you smoke, drink alcohol, or use illegal drugs. Some items may interact with your medicine. What should I watch for while using this medication? Tell your care team if your symptoms do not start to get better or if they get worse. Do not treat diarrhea with over the counter products. Contact your care team if you have diarrhea that lasts more than 2 days or if it is severe and watery. If you have diabetes, you may get a false-positive result for sugar in your urine. Check with your care team. If you are being treated for a sexually transmitted infection (STI), avoid sexual contact until you have finished your treatment. Your partner may also need treatment. What side effects may I notice from receiving this medication? Side effects that you should report to your care team as soon as possible: Allergic reactions--skin rash, itching, hives, swelling of the face, lips, tongue, or  throat Hemolytic anemia--unusual weakness or fatigue, dizziness, headache, trouble breathing, dark urine, yellowing skin or eyes Severe diarrhea, fever Unusual vaginal discharge, itching, or odor Side effects that usually do not require medical attention (report to your care team if they continue or are bothersome): Diarrhea Headache Nausea Pain, redness, or irritation at injection  site This list may not describe all possible side effects. Call your doctor for medical advice about side effects. You may report side effects to FDA at 1-800-FDA-1088. Where should I keep my medication? Keep out of the reach of children and pets. You will be instructed on how to store this medication. Get rid of any unused medication after the expiration date. To get rid of medications that are no longer needed or have expired: Take the medication to a medication take-back program. Check with your pharmacy or law enforcement to find a location. If you cannot return the medication, ask your pharmacist or care team how to get rid of this medication safely. NOTE: This sheet is a summary. It may not cover all possible information. If you have questions about this medicine, talk to your doctor, pharmacist, or health care provider.  2024 Elsevier/Gold Standard (2021-05-24 00:00:00)

## 2023-12-11 LAB — CULTURE, BLOOD (SINGLE)
Culture: NO GROWTH
Culture: NO GROWTH
Special Requests: ADEQUATE
Special Requests: ADEQUATE

## 2023-12-13 ENCOUNTER — Telehealth: Payer: Self-pay

## 2023-12-13 NOTE — Telephone Encounter (Signed)
 Pt states that the antibiotic Melissa gave her has her IBS acting up. She is having abdominal pain and loose stools. She wants to know if she can stop taking it? The cellulitis seems to be better. I see Dr Cornelius next week.

## 2023-12-14 ENCOUNTER — Encounter: Payer: Self-pay | Admitting: Oncology

## 2023-12-15 ENCOUNTER — Other Ambulatory Visit (HOSPITAL_BASED_OUTPATIENT_CLINIC_OR_DEPARTMENT_OTHER): Payer: Self-pay

## 2023-12-15 ENCOUNTER — Ambulatory Visit (HOSPITAL_BASED_OUTPATIENT_CLINIC_OR_DEPARTMENT_OTHER)
Admission: EM | Admit: 2023-12-15 | Discharge: 2023-12-15 | Disposition: A | Discharge status: Home / Self Care | Attending: Family Medicine | Admitting: Family Medicine

## 2023-12-15 ENCOUNTER — Encounter (HOSPITAL_BASED_OUTPATIENT_CLINIC_OR_DEPARTMENT_OTHER): Payer: Self-pay

## 2023-12-15 DIAGNOSIS — R0781 Pleurodynia: Secondary | ICD-10-CM | POA: Diagnosis not present

## 2023-12-15 DIAGNOSIS — J208 Acute bronchitis due to other specified organisms: Secondary | ICD-10-CM

## 2023-12-15 DIAGNOSIS — R051 Acute cough: Secondary | ICD-10-CM

## 2023-12-15 DIAGNOSIS — R509 Fever, unspecified: Secondary | ICD-10-CM

## 2023-12-15 MED ORDER — HYDROCOD POLI-CHLORPHE POLI ER 10-8 MG/5ML PO SUER
5.0000 mL | Freq: Two times a day (BID) | ORAL | 0 refills | Status: AC | PRN
  Filled 2023-12-15: qty 90, 9d supply, fill #0

## 2023-12-15 NOTE — ED Triage Notes (Signed)
 Pt c/o nasal congestion, cough-productive green/brown, bilateral rib pain when cough, low grade fever yesterday-99.9, slight body aches, and chills. Pt denies sore throat, facial pain, and, chest pain. Pt states she has issues with cellulitis and is going to see infection disease on 12/25/23. Pt has taken tylenol  sinus and zyrtec with no relief.

## 2023-12-15 NOTE — Medical Student Note (Addendum)
 [facility] Provider Student Note For educational purposes for Medical, PA and NP students only and not part of the legal medical record.   CSN: [id number] Arrival date & time: 12/15/23  1150      History   Chief Complaint Chief Complaint  Patient presents with   Nasal Congestion    HPI Doris Reyes is a 73 y.o. female.  Pt has a hx of GERD, HLD, HTN, IBS, Prediabetes, and recent hx of vulvar CA. She presents with a 3 day hx of nasal drainage body aches, elevated temp (TMax 99.9) and productive cough with green/Isma Tietje sputum. She has associated bilateral, mid-axillary CP that is present when she coughs. She does report DOE and chronic cough at baseline. She states the DOE has been present for over a year for which her PCP follows, and it has remained unchanged. She reports symptoms started after she sat out on her porch Tuesday night and it was windy. Her primary concern is that the cough is waking her up at night. She denies any sick contacts. Pt has started taking Tylenol  Severe Sinus and Zyrtec with minimal relief. Pt also takes Tessalon every night and fluticasone  nasal spray that she uses for her chronic conditions.   She does report she is also being followed for a cellulitis that is present under her abdominal folds. She sees is following up with her PCP early next week and is seeing ID on 9/29. She reports that the cellulitis is not showing signs of worsening at this time.   The history is provided by the patient.    Past Medical History:  Diagnosis Date   Anemia    Anxiety    Cancer (HCC)    right breast ILC   Complication of anesthesia    difficulty waking up   Depression    Diverticulitis    Family history of breast cancer    Family history of ovarian cancer    Family history of prostate cancer    Fibromyalgia    GERD (gastroesophageal reflux disease)    Heart murmur    History of colonic polyps    History of hiatal hernia    Hyperlipidemia     Hypertension    IBS (irritable bowel syndrome)    Ischemic colitis (HCC) 03/2012   Osteoporosis    Pre-diabetes    RLS (restless legs syndrome)    Sinusitis    Sleep apnea    CPAP nightly    Patient Active Problem List   Diagnosis Date Noted   Hypokalemia 10/04/2023   Cellulitis 10/04/2023   Shingles 10/04/2023   Anemia due to antineoplastic chemotherapy 10/04/2023   Urinary incontinence 09/18/2023   Hypomagnesemia 09/18/2023   Dehydration 09/11/2023   Vulvar edema 07/14/2023   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   Vulvar lesion 05/30/2023   Osteoporosis 09/14/2021    Class: Chronic   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   Genetic testing 07/26/2021   Family history of breast cancer 07/14/2021   Family history of ovarian cancer 07/14/2021   Family history of prostate cancer 07/14/2021   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) 06/08/2021    Class: Diagnosis of   GERD (gastroesophageal reflux disease) 12/11/2014   Fibromyalgia 12/11/2014   Allergic rhinitis 12/11/2014   Cough 12/11/2014   Generalized anxiety disorder 12/11/2014   Hyperlipidemia 12/11/2014   Vitamin D deficiency 12/11/2014   Esophageal spasm 12/11/2014   IBS (irritable bowel syndrome)  12/11/2014   Essential hypertension 12/11/2014   History of colonic polyps 12/11/2014   OSA (obstructive sleep apnea) 12/11/2014   Vomiting 10/06/2010    Past Surgical History:  Procedure Laterality Date   BREAST LUMPECTOMY WITH RADIOACTIVE SEED LOCALIZATION Right 08/04/2021   Procedure: RIGHT BREAST LUMPECTOMY WITH RADIOACTIVE SEED LOCALIZATION;  Surgeon: Vanderbilt Ned, MD;  Location: [facility];  Service: General;  Laterality: Right;   CHOLECYSTECTOMY  2004   COLONOSCOPY WITH PROPOFOL  N/A 04/18/2017   Procedure: COLONOSCOPY WITH PROPOFOL ;  Surgeon: Kristie Lamprey, MD;  Location: [facility];  Service: Endoscopy;  Laterality: N/A;   ESOPHAGOGASTRODUODENOSCOPY (EGD) WITH  PROPOFOL  N/A 04/18/2017   Procedure: ESOPHAGOGASTRODUODENOSCOPY (EGD) WITH PROPOFOL ;  Surgeon: Kristie Lamprey, MD;  Location: [facility];  Service: Endoscopy;  Laterality: N/A;   EXAM UNDER ANESTHESIA, PELVIC N/A 06/28/2023   Procedure: EXAM UNDER ANESTHESIA, PELVIC;  Surgeon: Viktoria Comer SAUNDERS, MD;  Location: [facility];  Service: Gynecology;  Laterality: N/A;   FLEXIBLE SIGMOIDOSCOPY  04/20/2012   Procedure: FLEXIBLE SIGMOIDOSCOPY;  Surgeon: Belvie JONETTA Just, MD;  Location: [facility];  Service: Endoscopy;  Laterality: N/A;   INGUINAL LYMPHADENECTOMY N/A 06/28/2023   Procedure: LYMPHADENECTOMY, INGUINAL, OPEN;  Surgeon: Viktoria Comer SAUNDERS, MD;  Location: [facility];  Service: Gynecology;  Laterality: N/A;   IR IMAGING GUIDED PORT INSERTION  07/31/2023   NISSEN FUNDOPLICATION  2011   PARAESOPHAGEAL HERNIA REPAIR  09/11/2009   and Nissen fundoplication   RADICAL VULVECTOMY N/A 06/28/2023   Procedure: VULVECTOMY, RADICAL;  Surgeon: Viktoria Comer SAUNDERS, MD;  Location: [facility];  Service: Gynecology;  Laterality: N/A;  Possible skin flap   RE-EXCISION OF BREAST LUMPECTOMY Right 08/18/2021   Procedure: RE-EXCISION RIGHT BREAST LUMPECTOMY;  Surgeon: Vanderbilt Ned, MD;  Location: [facility];  Service: General;  Laterality: Right;   SENTINEL NODE BIOPSY N/A 08/04/2021   Procedure: SENTINEL NODE BIOPSY;  Surgeon: Vanderbilt Ned, MD;  Location: [facility];  Service: General;  Laterality: N/A;   TONSILLECTOMY  1960's   TOTAL ABDOMINAL HYSTERECTOMY  2001   w/ BSO    OB History     Gravida  1   Para  1   Term      Preterm      AB      Living  1      SAB      IAB      Ectopic      Multiple      Live Births               Home Medications    Prior to Admission medications   Medication Sig Start Date End Date Taking? Authorizing Provider  acetaminophen  (TYLENOL ) 650 MG CR tablet 1,300 mg in the morning and at bedtime.    [provider]  amLODipine  (NORVASC ) 5 MG  tablet Take 5 mg by mouth daily.    [provider]  amoxicillin  (AMOXIL ) 500 MG capsule Take 1 capsule (500 mg total) by mouth 2 (two) times daily. 12/08/23   Harl Setter A, NP  Ascorbic Acid (VITAMIN C) 500 MG CHEW Chew 500 mg by mouth daily.    [provider]  aspirin EC 81 MG tablet Take 81 mg by mouth at bedtime. Swallow whole.    [provider]  atenolol  (TENORMIN ) 50 MG tablet Take 75 mg by mouth daily.     [provider]  benazepril  (LOTENSIN ) 10 MG tablet Take 10 mg by mouth daily.    [provider]  benzonatate (TESSALON) 100 MG capsule Take 100-200 mg by mouth at bedtime.    [provider]  Biotin 1000 MCG tablet Take 1,000 mcg by mouth daily.    [provider]  Calcium  Citrate-Vitamin D (CITRACAL + D PO) Take 1 tablet by mouth daily.    [provider]  cetirizine (ZYRTEC) 10 MG tablet Take 10 mg by mouth at bedtime.    [provider]  Cholecalciferol (VITAMIN D) 50 MCG (2000 UT) tablet Take 2,000 Units by mouth daily.    [provider]  cyanocobalamin  1000 MCG tablet Take 1,000 mcg by mouth daily.    [provider]  diclofenac sodium (VOLTAREN) 1 % GEL Apply 1 application  topically daily as needed (for pain). 09/21/12   [provider]  dicyclomine (BENTYL) 10 MG capsule Take 1 capsule by mouth as needed. 07/25/11   [provider]  diphenhydrAMINE  (BENADRYL ) 25 MG tablet Take 25 mg by mouth every 6 (six) hours as needed for allergies.    [provider]  diphenoxylate-atropine (LOMOTIL) 2.5-0.025 MG tablet Take 2 tablets by mouth 4 (four) times daily as needed for diarrhea or loose stools.    [provider]  DULoxetine  (CYMBALTA ) 60 MG capsule 2 capsules Orally once a day 04/05/13   [provider]  EPINEPHrine  (EPIPEN  2-PAK) 0.3 mg/0.3 mL IJ SOAJ injection Inject 0.3 mg into the muscle as needed for anaphylaxis.    [provider]  famotidine  (PEPCID ) 20 MG tablet Take 20 mg by mouth at bedtime.    [provider]  fexofenadine (ALLEGRA) 180 MG tablet Take 180 mg by mouth in the morning.    [provider]  fluticasone  (FLONASE ) 50 MCG/ACT nasal spray Place 1 spray into both nostrils daily.    [provider]  lidocaine  (XYLOCAINE ) 5 % ointment Apply 1 Application topically 2 (two) times daily as needed for moderate pain (pain score 4-6) (to the vulva). Apply to vulva as needed for discomfort 10/11/23   Harl Setter A, NP  lidocaine  4 % Place 1 patch onto the skin daily.    [provider]  LORazepam  (ATIVAN ) 1 MG tablet Take 1 tablet (1 mg total) by mouth every 8 (eight) hours. 09/12/23   Harl Setter LABOR, NP  NON FORMULARY Pt uses a c-pap nightly    [provider]  Nutritional Supplements (JUICE PLUS FIBRE PO) Take 2 each by mouth daily. Fruits and Vegetables    [provider]  nystatin  (MYCOSTATIN ) 100000 UNIT/ML suspension Take 5 mLs (500,000 Units total) by mouth 4 (four) times daily. 10/11/23   Harl Setter A, NP  nystatin  (MYCOSTATIN /NYSTOP ) powder Apply 1 Application topically 3 (three) times daily. 12/07/23   Harl Setter LABOR, NP  Omega-3 Fatty Acids (FISH OIL) 1000 MG CAPS Take 1,000 mg by mouth daily.     [provider]  oxyCODONE -acetaminophen  (PERCOCET/ROXICET) 5-325 MG tablet Take 1-2 tablets by mouth every 4 (four) hours as needed for severe pain (pain score 7-10). 12/07/23   Parsons, Melissa A, NP  pantoprazole  (PROTONIX ) 40 MG tablet Take 40 mg by mouth every evening.    [provider]  Phenylephrine -APAP-Guaifenesin (TYLENOL  SINUS SEVERE PO) Take 2 tablets by mouth daily as needed (drainage).    [provider]  polyvinyl alcohol (LIQUIFILM TEARS) 1.4 % ophthalmic solution Place 1 drop into both eyes 5 (five) times daily.    [provider]  Probiotic Product (PROBIOTIC & ACIDOPHILUS EX ST PO)  Take 1 capsule by mouth daily. Ultra Flora Plus Capsules    [provider]  Propylene Glycol (SYSTANE COMPLETE) 0.6 % SOLN Place 1 drop into both eyes in the morning and at bedtime.    [provider]  rOPINIRole  (REQUIP ) 0.5 MG tablet Take 0.5 mg by mouth at bedtime.    [provider]  senna-docusate (SENOKOT-S) 8.6-50 MG tablet Take 2 tablets by mouth at bedtime. For AFTER surgery, do not take if having diarrhea 06/09/23   Micheline Setter D, NP  simvastatin  (ZOCOR ) 10 MG tablet Take 10 mg by mouth at bedtime.      [provider]  sucralfate  (CARAFATE ) 1 GM/10ML suspension Take 1 g by mouth 2 (two) times daily.    [provider]  traZODone  (DESYREL ) 50 MG tablet Take 50 mg by mouth at bedtime.    [provider]  triamcinolone  (KENALOG ) 0.025 % ointment Apply 1 Application topically 2 (two) times daily. 07/21/23   Tucker, Katherine R, MD  vitamin E 400 UNIT capsule Take 400 Units by mouth daily.      [provider]  White Petrolatum -Mineral Oil (REFRESH P.M. OP) Place 1 Application into both eyes at bedtime.    [provider]    Family History Family History  Problem Relation Age of Onset   Colon cancer Mother 8   Alzheimer's disease Mother    Heart attack Father    Cancer Maternal Aunt        x2   Alzheimer's disease Maternal Aunt    Ovarian cancer Maternal Aunt    Prostate cancer Maternal Uncle    Kidney disease Maternal Uncle    Atrial fibrillation Maternal Uncle    Stroke Paternal Uncle    Aneurysm Paternal Grandmother        brain   Stroke Paternal Grandfather    Prostate cancer Cousin        paternal first cousin   Prostate cancer Cousin        paternal first cousin   Esophageal cancer Cousin        maternal first cousin   Breast cancer Cousin        DCIS, maternal first cousin   Ovarian cancer Cousin        maternal first cousin   Rheum arthritis Other    Lung disease Neg Hx     Social  History Social History   Tobacco Use   Smoking status: Never   Smokeless tobacco: Never   Tobacco comments:    brief exposure through her husband  Vaping Use   Vaping status: Never Used  Substance Use Topics   Alcohol use: Never    Alcohol/week: 0.0 standard drinks of alcohol   Drug use: Never     Allergies   Cefuroxime, Sulfa  antibiotics, Tramadol, Ceftin [cefuroxime axetil], Compazine  [prochlorperazine ], Ondansetron  hcl, Other, Tramadol hcl, and Zofran  [ondansetron ]   Review of Systems Review of Systems  Constitutional:  Positive for chills and fever.  HENT:  Positive for congestion and rhinorrhea. Negative for sore throat.   Respiratory:  Positive for cough (productive with green/Felicita Nuncio sputum). Negative for chest tightness and shortness of breath.   Cardiovascular:  Positive for chest pain.  Gastrointestinal:  Negative for abdominal pain, diarrhea, nausea and vomiting.  Musculoskeletal:  Positive for myalgias.  Neurological:  Negative for headaches.     Physical Exam Updated Vital Signs BP 136/81 (BP Location: Right Arm)   Pulse 67   Temp 98.6 F (37 C) (  Oral)   Resp 20   SpO2 95%   Physical Exam Constitutional:      Appearance: Normal appearance. She is not ill-appearing.  HENT:     Right Ear: Ear canal and external ear normal. There is impacted cerumen.     Left Ear: Tympanic membrane, ear canal and external ear normal.     Nose: Nose normal.     Mouth/Throat:     Mouth: Mucous membranes are moist.     Pharynx: No oropharyngeal exudate or posterior oropharyngeal erythema.  Eyes:     Conjunctiva/sclera: Conjunctivae normal.     Pupils: Pupils are equal, round, and reactive to light.  Cardiovascular:     Rate and Rhythm: Normal rate and regular rhythm.     Pulses: Normal pulses.     Heart sounds: Normal heart sounds.  Pulmonary:     Effort: Pulmonary effort is normal.     Breath sounds: Normal breath sounds.  Chest:     Chest wall: Tenderness present.      Comments: Lower bilateral chest wall tenderness to palpation at the mid-axillary line. No bruising, deformity, crepitus, or other obvious signs of trauma present.  Musculoskeletal:     Cervical back: Neck supple.  Lymphadenopathy:     Cervical: No cervical adenopathy.  Skin:    General: Skin is warm and dry.  Neurological:     Mental Status: She is alert.      ED Treatments / Results  Labs (all labs ordered are listed, but only abnormal results are displayed) Labs Reviewed - No data to display  EKG  Radiology No results found.  Procedures Procedures (including critical care time)  Medications Ordered in ED Medications - No data to display   Initial Impression / Assessment and Plan / ED Course  I have reviewed the triage vital signs and the nursing notes.  Pertinent labs & imaging results that were available during my care of the patient were reviewed by me and considered in my medical decision making (see chart for details).     Symptoms likely from a viral bronchitis. Discussed COVID-19 testing, treatment options, and goals of care with the patient using shared decision making and she declined testing at this juncture. Will have pt to continue Zyrtec, fluticasone , and tessalon. Will start her on Tussionex 10-8 mg/ 5 ml to be taken at night PRN for cough and not to be used at the same time with her Tessalon. PDMP was reviewed. Sedation and fall precautions given. Since the cellulitis is currently being managed, further treatment will be deferred to he PCP and ID and encouraged pt to keep appointments.   Pt will follow up with her PCP next week. Red flag symptoms reviewed and return precautions given.    Final Clinical Impressions(s) / ED Diagnoses   Final diagnoses:  Acute cough  Rib pain  Fever, unspecified    New Prescriptions New Prescriptions   No medications on file

## 2023-12-15 NOTE — ED Provider Notes (Signed)
 Doris Reyes    CSN: [id number] Arrival date & time: 12/15/23  1150      History   Chief Complaint Chief Complaint  Patient presents with   Nasal Congestion    HPI Doris Reyes is a 74 y.o. female.   73 year old female here with complaint of nasal congestion, cough with some sputum coming up at times.,  Rib pain from coughing, low-grade fever (99.9 on 12/14/2023), body aches and chills.  She denies sore throat, facial pain, chest pain, nausea, vomiting, constipation, diarrhea.  She has a chronic cellulitis of her lower abdomen and will be seeing infectious disease regarding this cellulitis.  She was on amoxicillin  for that cellulitis but it was stopped in the last 2 days due to diarrhea and GI upset from the amoxicillin  use.  She has taken Tylenol  sinus and OTC Zyrtec for her nasal congestion with poor relief.     Past Medical History:  Diagnosis Date   Anemia    Anxiety    Cancer (HCC)    right breast ILC   Complication of anesthesia    difficulty waking up   Depression    Diverticulitis    Family history of breast cancer    Family history of ovarian cancer    Family history of prostate cancer    Fibromyalgia    GERD (gastroesophageal reflux disease)    Heart murmur    History of colonic polyps    History of hiatal hernia    Hyperlipidemia    Hypertension    IBS (irritable bowel syndrome)    Ischemic colitis (HCC) 03/2012   Osteoporosis    Pre-diabetes    RLS (restless legs syndrome)    Sinusitis    Sleep apnea    CPAP nightly    Patient Active Problem List   Diagnosis Date Noted   Hypokalemia 10/04/2023   Cellulitis 10/04/2023   Shingles 10/04/2023   Anemia due to antineoplastic chemotherapy 10/04/2023   Urinary incontinence 09/18/2023   Hypomagnesemia 09/18/2023   Dehydration 09/11/2023   Vulvar edema 07/14/2023   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   Vulvar lesion 05/30/2023   Osteoporosis 09/14/2021     Class: Chronic   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   Genetic testing 07/26/2021   Family history of breast cancer 07/14/2021   Family history of ovarian cancer 07/14/2021   Family history of prostate cancer 07/14/2021   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) 06/08/2021    Class: Diagnosis of   GERD (gastroesophageal reflux disease) 12/11/2014   Fibromyalgia 12/11/2014   Allergic rhinitis 12/11/2014   Cough 12/11/2014   Generalized anxiety disorder 12/11/2014   Hyperlipidemia 12/11/2014   Vitamin D deficiency 12/11/2014   Esophageal spasm 12/11/2014   IBS (irritable bowel syndrome) 12/11/2014   Essential hypertension 12/11/2014   History of colonic polyps 12/11/2014   OSA (obstructive sleep apnea) 12/11/2014   Vomiting 10/06/2010    Past Surgical History:  Procedure Laterality Date   BREAST LUMPECTOMY WITH RADIOACTIVE SEED LOCALIZATION Right 08/04/2021   Procedure: RIGHT BREAST LUMPECTOMY WITH RADIOACTIVE SEED LOCALIZATION;  Surgeon: Vanderbilt Ned, MD;  Location: [facility];  Service: General;  Laterality: Right;   CHOLECYSTECTOMY  2004   COLONOSCOPY WITH PROPOFOL  N/A 04/18/2017   Procedure: COLONOSCOPY WITH PROPOFOL ;  Surgeon: Kristie Lamprey, MD;  Location: [facility];  Service: Endoscopy;  Laterality: N/A;   ESOPHAGOGASTRODUODENOSCOPY (EGD) WITH PROPOFOL  N/A 04/18/2017   Procedure: ESOPHAGOGASTRODUODENOSCOPY (EGD) WITH PROPOFOL ;  Surgeon: Kristie Lamprey, MD;  Location: [facility];  Service: Endoscopy;  Laterality: N/A;   EXAM UNDER ANESTHESIA, PELVIC N/A 06/28/2023   Procedure: EXAM UNDER ANESTHESIA, PELVIC;  Surgeon: Viktoria Comer SAUNDERS, MD;  Location: [facility];  Service: Gynecology;  Laterality: N/A;   FLEXIBLE SIGMOIDOSCOPY  04/20/2012   Procedure: FLEXIBLE SIGMOIDOSCOPY;  Surgeon: Belvie JONETTA Just, MD;  Location: [facility];  Service: Endoscopy;  Laterality: N/A;   INGUINAL LYMPHADENECTOMY N/A 06/28/2023   Procedure: LYMPHADENECTOMY, INGUINAL, OPEN;  Surgeon:  Viktoria Comer SAUNDERS, MD;  Location: [facility];  Service: Gynecology;  Laterality: N/A;   IR IMAGING GUIDED PORT INSERTION  07/31/2023   NISSEN FUNDOPLICATION  2011   PARAESOPHAGEAL HERNIA REPAIR  09/11/2009   and Nissen fundoplication   RADICAL VULVECTOMY N/A 06/28/2023   Procedure: VULVECTOMY, RADICAL;  Surgeon: Viktoria Comer SAUNDERS, MD;  Location: [facility];  Service: Gynecology;  Laterality: N/A;  Possible skin flap   RE-EXCISION OF BREAST LUMPECTOMY Right 08/18/2021   Procedure: RE-EXCISION RIGHT BREAST LUMPECTOMY;  Surgeon: Vanderbilt Ned, MD;  Location: [facility];  Service: General;  Laterality: Right;   SENTINEL NODE BIOPSY N/A 08/04/2021   Procedure: SENTINEL NODE BIOPSY;  Surgeon: Vanderbilt Ned, MD;  Location: [facility];  Service: General;  Laterality: N/A;   TONSILLECTOMY  1960's   TOTAL ABDOMINAL HYSTERECTOMY  2001   w/ BSO    OB History     Gravida  1   Para  1   Term      Preterm      AB      Living  1      SAB      IAB      Ectopic      Multiple      Live Births               Home Medications    Prior to Admission medications   Medication Sig Start Date End Date Taking? Authorizing Provider  chlorpheniramine-HYDROcodone (TUSSIONEX) 10-8 MG/5ML Take 5 mLs by mouth every 12 (twelve) hours as needed for cough. 12/15/23  Yes Ival Domino, FNP  acetaminophen  (TYLENOL ) 650 MG CR tablet 1,300 mg in the morning and at bedtime.    [provider]  amLODipine  (NORVASC ) 5 MG tablet Take 5 mg by mouth daily.    [provider]  Ascorbic Acid (VITAMIN C) 500 MG CHEW Chew 500 mg by mouth daily.    [provider]  aspirin EC 81 MG tablet Take 81 mg by mouth at bedtime. Swallow whole.    [provider]  atenolol  (TENORMIN ) 50 MG tablet Take 75 mg by mouth daily.     [provider]  benazepril  (LOTENSIN ) 10 MG tablet Take 10 mg by mouth daily.    [provider]  benzonatate (TESSALON) 100 MG capsule  Take 100-200 mg by mouth at bedtime.    [provider]  Biotin 1000 MCG tablet Take 1,000 mcg by mouth daily.    [provider]  Calcium  Citrate-Vitamin D (CITRACAL + D PO) Take 1 tablet by mouth daily.    [provider]  cetirizine (ZYRTEC) 10 MG tablet Take 10 mg by mouth at bedtime.    [provider]  Cholecalciferol (VITAMIN D) 50 MCG (2000 UT) tablet Take 2,000 Units by mouth daily.    [provider]  cyanocobalamin  1000 MCG tablet Take 1,000 mcg by mouth daily.    [provider]  diclofenac sodium (VOLTAREN) 1 %  GEL Apply 1 application  topically daily as needed (for pain). 09/21/12   [provider]  dicyclomine (BENTYL) 10 MG capsule Take 1 capsule by mouth as needed. 07/25/11   [provider]  diphenhydrAMINE  (BENADRYL ) 25 MG tablet Take 25 mg by mouth every 6 (six) hours as needed for allergies.    [provider]  diphenoxylate-atropine (LOMOTIL) 2.5-0.025 MG tablet Take 2 tablets by mouth 4 (four) times daily as needed for diarrhea or loose stools.    [provider]  DULoxetine  (CYMBALTA ) 60 MG capsule 2 capsules Orally once a day 04/05/13   [provider]  EPINEPHrine  (EPIPEN  2-PAK) 0.3 mg/0.3 mL IJ SOAJ injection Inject 0.3 mg into the muscle as needed for anaphylaxis.    [provider]  famotidine  (PEPCID ) 20 MG tablet Take 20 mg by mouth at bedtime.    [provider]  fexofenadine (ALLEGRA) 180 MG tablet Take 180 mg by mouth in the morning.    [provider]  fluticasone  (FLONASE ) 50 MCG/ACT nasal spray Place 1 spray into both nostrils daily.    [provider]  lidocaine  (XYLOCAINE ) 5 % ointment Apply 1 Application topically 2 (two) times daily as needed for moderate pain (pain score 4-6) (to the vulva). Apply to vulva as needed for discomfort 10/11/23   Harl Setter A, NP  lidocaine  4 % Place 1 patch onto the skin daily.    [provider]  LORazepam  (ATIVAN ) 1 MG tablet Take 1 tablet (1 mg total) by mouth every 8 (eight) hours. 09/12/23   Harl Setter LABOR, NP  NON FORMULARY Pt uses a c-pap nightly    [provider]  Nutritional Supplements (JUICE PLUS FIBRE PO) Take 2 each by mouth daily. Fruits and Vegetables    [provider]  nystatin  (MYCOSTATIN ) 100000 UNIT/ML suspension Take 5 mLs (500,000 Units total) by mouth 4 (four) times daily. 10/11/23   Harl Setter LABOR, NP  nystatin  (MYCOSTATIN /NYSTOP ) powder Apply 1 Application topically 3 (three) times daily. 12/07/23   Harl Setter LABOR, NP  Omega-3 Fatty Acids (FISH OIL) 1000 MG CAPS Take 1,000 mg by mouth daily.     [provider]  oxyCODONE -acetaminophen  (PERCOCET/ROXICET) 5-325 MG tablet Take 1-2 tablets by mouth every 4 (four) hours as needed for severe pain (pain score 7-10). 12/07/23   Harl Setter A, NP  pantoprazole  (PROTONIX ) 40 MG tablet Take 40 mg by mouth every evening.    [provider]  Phenylephrine -APAP-Guaifenesin (TYLENOL  SINUS SEVERE PO) Take 2 tablets by mouth daily as needed (drainage).    [provider]  polyvinyl alcohol (LIQUIFILM TEARS) 1.4 % ophthalmic solution Place 1 drop into both eyes 5 (five) times daily.    [provider]  Probiotic Product (PROBIOTIC & ACIDOPHILUS EX ST PO) Take 1 capsule by mouth daily. Ultra Flora Plus Capsules    [provider]  Propylene Glycol (SYSTANE COMPLETE) 0.6 % SOLN Place 1 drop into both eyes in the morning and at bedtime.    [provider]  rOPINIRole  (REQUIP ) 0.5 MG tablet Take 0.5 mg by mouth at bedtime.    [provider]  senna-docusate (SENOKOT-S) 8.6-50 MG tablet Take 2 tablets by mouth at bedtime. For AFTER surgery, do not take if having diarrhea 06/09/23   Micheline Setter D, NP  simvastatin  (ZOCOR ) 10 MG tablet Take 10 mg by mouth at bedtime.      [provider]  sucralfate  (CARAFATE ) 1 GM/10ML  suspension Take 1  g by mouth 2 (two) times daily.    [provider]  traZODone  (DESYREL ) 50 MG tablet Take 50 mg by mouth at bedtime.    [provider]  triamcinolone  (KENALOG ) 0.025 % ointment Apply 1 Application topically 2 (two) times daily. 07/21/23   Tucker, Katherine R, MD  vitamin E 400 UNIT capsule Take 400 Units by mouth daily.      [provider]  White Petrolatum -Mineral Oil (REFRESH P.M. OP) Place 1 Application into both eyes at bedtime.    [provider]    Family History Family History  Problem Relation Age of Onset   Colon cancer Mother 7   Alzheimer's disease Mother    Heart attack Father    Cancer Maternal Aunt        x2   Alzheimer's disease Maternal Aunt    Ovarian cancer Maternal Aunt    Prostate cancer Maternal Uncle    Kidney disease Maternal Uncle    Atrial fibrillation Maternal Uncle    Stroke Paternal Uncle    Aneurysm Paternal Grandmother        brain   Stroke Paternal Grandfather    Prostate cancer Cousin        paternal first cousin   Prostate cancer Cousin        paternal first cousin   Esophageal cancer Cousin        maternal first cousin   Breast cancer Cousin        DCIS, maternal first cousin   Ovarian cancer Cousin        maternal first cousin   Rheum arthritis Other    Lung disease Neg Hx     Social History Social History   Tobacco Use   Smoking status: Never   Smokeless tobacco: Never   Tobacco comments:    brief exposure through her husband  Vaping Use   Vaping status: Never Used  Substance Use Topics   Alcohol use: Never    Alcohol/week: 0.0 standard drinks of alcohol   Drug use: Never     Allergies   Cefuroxime, Sulfa  antibiotics, Tramadol, Ceftin [cefuroxime axetil], Compazine  [prochlorperazine ], Ondansetron  hcl, Other, Tramadol hcl, and Zofran  [ondansetron ]   Review of Systems Review of Systems  Constitutional:  Positive for chills and fever.  HENT:  Positive for congestion,  postnasal drip, rhinorrhea, sinus pressure and sinus pain. Negative for ear pain and sore throat.   Eyes:  Negative for pain and visual disturbance.  Respiratory:  Positive for cough. Negative for shortness of breath.   Cardiovascular:  Positive for chest pain (Rib pain with cough). Negative for palpitations.  Gastrointestinal:  Negative for abdominal pain, constipation, diarrhea, nausea and vomiting.  Genitourinary:  Negative for dysuria and hematuria.  Musculoskeletal:  Positive for arthralgias. Negative for back pain.  Skin:  Positive for wound (Cellulitis of lower abdomen). Negative for color change and rash.  Neurological:  Negative for seizures and syncope.  All other systems reviewed and are negative.    Physical Exam Triage Vital Signs ED Triage Vitals  Encounter Vitals Group     BP 12/15/23 1204 136/81     Girls Systolic BP Percentile --      Girls Diastolic BP Percentile --      Boys Systolic BP Percentile --      Boys Diastolic BP Percentile --      Pulse Rate 12/15/23 1204 67     Resp 12/15/23 1204 20     Temp 12/15/23 1204 98.6  F (37 C)     Temp Source 12/15/23 1204 Oral     SpO2 12/15/23 1204 95 %     Weight --      Height --      Head Circumference --      Peak Flow --      Pain Score 12/15/23 1202 2     Pain Loc --      Pain Education --      Exclude from Growth Chart --    No data found.  Updated Vital Signs BP 136/81 (BP Location: Right Arm)   Pulse 67   Temp 98.6 F (37 C) (Oral)   Resp 20   SpO2 95%   Visual Acuity Right Eye Distance:   Left Eye Distance:   Bilateral Distance:    Right Eye Near:   Left Eye Near:    Bilateral Near:     Physical Exam Vitals and nursing note reviewed.  Constitutional:      General: She is not in acute distress.    Appearance: She is well-developed. She is not ill-appearing, toxic-appearing or diaphoretic.  HENT:     Head: Normocephalic and atraumatic.     Right Ear: Hearing, tympanic membrane, ear canal  and external ear normal.     Left Ear: Hearing, tympanic membrane, ear canal and external ear normal.     Nose: Congestion and rhinorrhea present. Rhinorrhea is clear.     Right Sinus: No maxillary sinus tenderness or frontal sinus tenderness.     Left Sinus: No maxillary sinus tenderness or frontal sinus tenderness.     Mouth/Throat:     Lips: Pink.     Mouth: Mucous membranes are moist.     Pharynx: Uvula midline. No oropharyngeal exudate or posterior oropharyngeal erythema.     Tonsils: No tonsillar exudate.  Eyes:     Conjunctiva/sclera: Conjunctivae normal.     Pupils: Pupils are equal, round, and reactive to light.  Cardiovascular:     Rate and Rhythm: Normal rate and regular rhythm.     Heart sounds: S1 normal and S2 normal. No murmur heard. Pulmonary:     Effort: Pulmonary effort is normal. No respiratory distress.     Breath sounds: Normal breath sounds. No decreased breath sounds, wheezing, rhonchi or rales.  Abdominal:     General: Bowel sounds are normal.     Palpations: Abdomen is soft.     Tenderness: There is no abdominal tenderness.  Musculoskeletal:        General: No swelling.     Cervical back: Neck supple.  Lymphadenopathy:     Head:     Right side of head: No submental, submandibular, tonsillar, preauricular or posterior auricular adenopathy.     Left side of head: No submental, submandibular, tonsillar, preauricular or posterior auricular adenopathy.     Cervical: Cervical adenopathy present.     Right cervical: Superficial cervical adenopathy present.     Left cervical: Superficial cervical adenopathy present.  Skin:    General: Skin is warm and dry.     Capillary Refill: Capillary refill takes less than 2 seconds.     Findings: No rash.  Neurological:     Mental Status: She is alert and oriented to person, place, and time.  Psychiatric:        Mood and Affect: Mood normal.      UC Treatments / Results  Labs (all labs ordered are listed, but only  abnormal results are displayed) Labs  Reviewed - No data to display  EKG   Radiology No results found.  Procedures Procedures (including critical Reyes time)  Medications Ordered in UC Medications - No data to display  Initial Impression / Assessment and Plan / UC Course  I have reviewed the triage vital signs and the nursing notes.  Pertinent labs & imaging results that were available during my Reyes of the patient were reviewed by me and considered in my medical decision making (see chart for details).  Plan of Reyes: Viral bronchitis with fever, cough and rib pain with cough: Continue fluticasone  and Zyrtec as previously prescribed.  Use Tussionex cough syrup, 5 mL, every 6-8 hours if needed for severe cough.  Do not use the Tussionex and drive.  Do not use the Tussionex and benzonatate at the same time.  Get plenty of fluids and rest.  Discussed COVID testing and patient was not in agreement with COVID testing.  She also would not want to take medication for COVID if she were COVID-positive.  Her real concern is her cough and she just really wanted something to help her with her cough more.  She was on amoxicillin  until 1 to 2 days ago and her chest is clear.  Abdominal cellulitis: Patient was on amoxicillin  for this from primary Reyes but had too much GI intolerance and stopped it.  She is awaiting a visit on 12/25/2023 with infectious disease for further workup and management.  She will follow-up with either primary Reyes or infectious disease about her lower abdominal cellulitis.  She will return here as needed.  I reviewed the plan of Reyes with the patient and/or the patient's guardian.  The patient and/or guardian had time to ask questions and acknowledged that the questions were answered.  I provided instruction on symptoms or reasons to return here or to go to an ER, if symptoms/condition did not improve, worsened or if new symptoms occurred.  Final Clinical Impressions(s) / UC  Diagnoses   Final diagnoses:  Acute cough  Rib pain  Fever, unspecified  Acute viral bronchitis     Discharge Instructions      Acute viral bronchitis with some fever and mild rib pain from coughing: Continue fluticasone  and Zyrtec as previously prescribed.  Added Tussionex cough syrup, 5 mL, every 6-8 hours if needed for severe cough.  Do not use the Tussionex and drive.  Does not need to use both the benzonatate and the Tussionex at the same time.  Get plenty of fluids and rest.  Follow-up if symptoms do not improve, or if symptoms worsen or if new symptoms occur.  Patient has reported a lower abdominal wall cellulitis that is being managed by primary Reyes and for which she has been referred to infectious disease.  She should follow-up with these providers as planned.     ED Prescriptions     Medication Sig Dispense Auth. Provider   chlorpheniramine-HYDROcodone (TUSSIONEX) 10-8 MG/5ML Take 5 mLs by mouth every 12 (twelve) hours as needed for cough. 90 mL Ival Domino, FNP      I have reviewed the PDMP during this encounter.   Ival Domino, FNP 12/15/23 1334

## 2023-12-15 NOTE — Discharge Instructions (Addendum)
 Acute viral bronchitis with some fever and mild rib pain from coughing: Continue fluticasone  and Zyrtec as previously prescribed.  Added Tussionex cough syrup, 5 mL, every 6-8 hours if needed for severe cough.  Do not use the Tussionex and drive.  Does not need to use both the benzonatate and the Tussionex at the same time.  Get plenty of fluids and rest.  Follow-up if symptoms do not improve, or if symptoms worsen or if new symptoms occur.  Patient has reported a lower abdominal wall cellulitis that is being managed by primary care and for which she has been referred to infectious disease.  She should follow-up with these providers as planned.

## 2023-12-18 DIAGNOSIS — L821 Other seborrheic keratosis: Secondary | ICD-10-CM | POA: Diagnosis not present

## 2023-12-18 DIAGNOSIS — L814 Other melanin hyperpigmentation: Secondary | ICD-10-CM | POA: Diagnosis not present

## 2023-12-18 DIAGNOSIS — L578 Other skin changes due to chronic exposure to nonionizing radiation: Secondary | ICD-10-CM | POA: Diagnosis not present

## 2023-12-18 DIAGNOSIS — D485 Neoplasm of uncertain behavior of skin: Secondary | ICD-10-CM | POA: Diagnosis not present

## 2023-12-19 ENCOUNTER — Ambulatory Visit

## 2023-12-19 ENCOUNTER — Other Ambulatory Visit

## 2023-12-19 ENCOUNTER — Ambulatory Visit: Admitting: Oncology

## 2023-12-19 DIAGNOSIS — H524 Presbyopia: Secondary | ICD-10-CM | POA: Diagnosis not present

## 2023-12-19 DIAGNOSIS — H04123 Dry eye syndrome of bilateral lacrimal glands: Secondary | ICD-10-CM | POA: Diagnosis not present

## 2023-12-21 ENCOUNTER — Ambulatory Visit

## 2023-12-21 ENCOUNTER — Inpatient Hospital Stay: Admitting: Oncology

## 2023-12-21 ENCOUNTER — Encounter: Payer: Self-pay | Admitting: Oncology

## 2023-12-21 ENCOUNTER — Other Ambulatory Visit: Payer: Self-pay | Admitting: Oncology

## 2023-12-21 ENCOUNTER — Inpatient Hospital Stay

## 2023-12-21 ENCOUNTER — Telehealth: Payer: Self-pay | Admitting: Oncology

## 2023-12-21 ENCOUNTER — Ambulatory Visit: Admitting: Hematology and Oncology

## 2023-12-21 ENCOUNTER — Other Ambulatory Visit

## 2023-12-21 ENCOUNTER — Other Ambulatory Visit (HOSPITAL_BASED_OUTPATIENT_CLINIC_OR_DEPARTMENT_OTHER): Payer: Self-pay

## 2023-12-21 VITALS — BP 132/71 | HR 56 | Temp 98.0°F | Resp 16 | Ht 63.0 in | Wt 208.5 lb

## 2023-12-21 DIAGNOSIS — C50111 Malignant neoplasm of central portion of right female breast: Secondary | ICD-10-CM

## 2023-12-21 DIAGNOSIS — B3749 Other urogenital candidiasis: Secondary | ICD-10-CM

## 2023-12-21 DIAGNOSIS — M8589 Other specified disorders of bone density and structure, multiple sites: Secondary | ICD-10-CM | POA: Diagnosis not present

## 2023-12-21 DIAGNOSIS — C519 Malignant neoplasm of vulva, unspecified: Secondary | ICD-10-CM

## 2023-12-21 DIAGNOSIS — Z803 Family history of malignant neoplasm of breast: Secondary | ICD-10-CM | POA: Diagnosis not present

## 2023-12-21 DIAGNOSIS — Z1502 Genetic susceptibility to malignant neoplasm of ovary: Secondary | ICD-10-CM | POA: Diagnosis not present

## 2023-12-21 DIAGNOSIS — Z1509 Genetic susceptibility to other malignant neoplasm: Secondary | ICD-10-CM | POA: Diagnosis not present

## 2023-12-21 LAB — CMP (CANCER CENTER ONLY)
ALT: 17 U/L (ref 0–44)
AST: 20 U/L (ref 15–41)
Albumin: 4 g/dL (ref 3.5–5.0)
Alkaline Phosphatase: 88 U/L (ref 38–126)
Anion gap: 12 (ref 5–15)
BUN: 23 mg/dL (ref 8–23)
CO2: 24 mmol/L (ref 22–32)
Calcium: 9.6 mg/dL (ref 8.9–10.3)
Chloride: 106 mmol/L (ref 98–111)
Creatinine: 1.11 mg/dL — ABNORMAL HIGH (ref 0.44–1.00)
GFR, Estimated: 52 mL/min — ABNORMAL LOW (ref >60)
Glucose, Bld: 98 mg/dL (ref 70–99)
Potassium: 4.2 mmol/L (ref 3.5–5.1)
Sodium: 141 mmol/L (ref 135–145)
Total Bilirubin: 0.3 mg/dL (ref 0.0–1.2)
Total Protein: 6.7 g/dL (ref 6.5–8.1)

## 2023-12-21 LAB — CBC WITH DIFFERENTIAL (CANCER CENTER ONLY)
Abs Immature Granulocytes: 0.01 K/uL (ref 0.00–0.07)
Basophils Absolute: 0 K/uL (ref 0.0–0.1)
Basophils Relative: 1 %
Eosinophils Absolute: 0.4 K/uL (ref 0.0–0.5)
Eosinophils Relative: 8 %
HCT: 31.5 % — ABNORMAL LOW (ref 36.0–46.0)
Hemoglobin: 10.2 g/dL — ABNORMAL LOW (ref 12.0–15.0)
Immature Granulocytes: 0 %
Lymphocytes Relative: 16 %
Lymphs Abs: 0.8 K/uL (ref 0.7–4.0)
MCH: 28.3 pg (ref 26.0–34.0)
MCHC: 32.4 g/dL (ref 30.0–36.0)
MCV: 87.3 fL (ref 80.0–100.0)
Monocytes Absolute: 0.7 K/uL (ref 0.1–1.0)
Monocytes Relative: 14 %
Neutro Abs: 3 K/uL (ref 1.7–7.7)
Neutrophils Relative %: 61 %
Platelet Count: 256 K/uL (ref 150–400)
RBC: 3.61 MIL/uL — ABNORMAL LOW (ref 3.87–5.11)
RDW: 13.2 % (ref 11.5–15.5)
WBC Count: 4.9 K/uL (ref 4.0–10.5)
nRBC: 0 % (ref 0.0–0.2)

## 2023-12-21 LAB — MAGNESIUM: Magnesium: 2.1 mg/dL (ref 1.7–2.4)

## 2023-12-21 MED ORDER — FLUCONAZOLE 200 MG PO TABS
200.0000 mg | ORAL_TABLET | Freq: Every day | ORAL | 0 refills | Status: AC
  Filled 2023-12-21: qty 10, 10d supply, fill #0

## 2023-12-21 MED ORDER — SODIUM CHLORIDE 0.9 % IV SOLN: Freq: Once | INTRAVENOUS | Status: AC

## 2023-12-21 NOTE — Patient Instructions (Signed)

## 2023-12-21 NOTE — Progress Notes (Signed)
 [facility]  [address] [place],  [place]  [address] [phone]  Clinic Day:  12/21/23  Referring physician: [name], MD  ASSESSMENT & PLAN:  Assessment: Invasive lobular carcinoma This was found on screening mammogram but the MRI reveals focal enhancement surrounding the biopsy clip up to 2.3 cm and there was non-mass enhancement posterior to this known malignancy on MRI.  This area was biopsied in order to guide her ultimate surgery.This is strongly ER/PR positive, HER2 negative and has a low Ki 67 of less than 5%.  She has now had a lumpectomy but had to go back for reexcision of the margins.  The sentinel lymph node was negative but she did have 2 primaries, measuring 2.4 cm and 2.2 cm, for a T2 N0 M0, stage IIA.  Even though she has several favorable characteristics, such as grade 1 histology and a low Ki 67, I still recommended that we pursue Endopredict testing to quantitate her risk for recurrence, and fortunately she has an EpClin score of 2.6, low risk.  This correlates with a 5.1% risk of distant recurrence in the next 10 years. Her benefit from chemotherapy would be 1.0% so we did not pursue. Her risk for a late recurrence is 4.0%. Unfortunately she has refused hormonal therapy.    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  She was treated with pelvic vulvectomy, bilateral debulking, radical lymphadenectomy, and inguinal dissection in April 2025.  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  There was lymphovascular invasion and carcinoma in situ at the 6 o'clock margin with < 1 mm margin for invasive carcinoma. PET scan revealed metastatic left inguinal lymph nodes up to 1.8 cm in diameter, but no additional evidence of metastatic disease in the  neck, chest, abdomen or pelvis. There was a 4 mm right lower lobe nodule, too small for PET resolution.    She was receiving concurrent chemoradiation with weekly cisplatin , as well as pembrolizuab every 3 weeks since her PDL 1 score is 85%, followed by maintenance pembrolizumab .  She had difficulty tolerating treatment due to nausea, vomiting and diarrhea, as well as hypomagnesemia.  We gave her additional IV fluids weekly and IV magnesium  as needed.  She developed mild pancytopenia felt to be most likely due to treatment.  After her third cycle of weekly cisplatin , she was hospitalized with sepsis due to recurrent cellulitis of the left vulva and thigh.  She then developed shingles of the left thigh.  She had pancytopenia at least in part due to chemotherapy.  At discharge on July 4, WBCs were 2.5, hemoglobin 8.7 and platelets 142,000. She was discharged on doxycycline and valacyclovir.  Due to the multiple issues with chemotherapy and radiation, she has declined taking any further therapy. She had about half of her treatment. I offered her continuation of immunotherapy but she declines that as well.   Cellulitis of the genitalia She has had multiple recurrences with severe erythema, pain, and fever which has responded to antibiotics. She had to stop the antibiotics last time due to diarrhea and IBS, but I think her infection is clearing now and we will hold off on any further antibiotics. I will prescribe Diflucan  for presumed yeast infection in view of her excessive pruritus. She is scheduled to see  Infectious disease, but the problem may be resolving at this point.    Osteopenia Her spine shows a T score of -2.6 for osteoporosis and the right femur has osteopenia.  At the very least she should be taking calcium  and vitamin D. Bone density scan done on 07/17/2023 revealed osteopenia.   Strong family history Including multiple females with breast cancer, her mother had colon cancer at age 24, and  another maternal aunt had ovarian cancer.  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  She has been counseled on the implications of this and is already getting regular colonoscopy.  However the information will also be passed on to family members to pursue testing.   Plan: Patient states that she feels okay, but complains of vaginal redness and itching She denies any vaginal discharge. She informed me that her cellulitis was improving until she had a fall 2 weeks ago. Melissa placed her on antibiotics for the increased inflammation, but she has discontinued her Amoxicillin  due to IBS. She has an appointment with Infectious disease on 12/25/2023 for further evaluation. She also has her follow-up with GYN oncology next week. Fortunately, the cellulitis of the genitalia seems to be improving now after multiple flares. She visited urgent care on 12/15/2023 for a productive cough which was found to be acute viral bronchitis. She was instructed to continue fluticasone  and Zyrtec, and add Tussionex as needed, but she has not taken the Tussionex in 2 days as the cough has now resolved. Right anterior tibia skin biopsy was recently performed  and I will request the pathology. I will place her on 200 mg daily Diflucan  for 10 days. I instructed her to pause her Zocor  while she is taking the antifungal medication. She had a CT abdomen pelvis performed on 12/07/2023 which revealed no acute intra-abdominal or pelvic pathology, sigmoid diverticulosis with a normal appendix and no bowel obstruction, thickening of the skin and subcutaneous stranding over the mons pubis and medial proximal left thigh which may represent cellulitis with no drainable fluid collection/abscess or soft tissue gas, and aortic atherosclerosis. She has a WBC of 4.9, a stable low hemoglobin of 10.2, and platelet count of 256,000. Her CMP is normal other than an elevated creatinine of 1.11, improved from 1.18.  Her magnesium  is normal at 2.1. She will receive 1 liter of IV normal saline. I will see her back in 2 weeks with CBC and CMP. The patient understands the plans discussed today and is in agreement with them.  She knows to contact our office if she develops concerns prior to her next appointment.  I provided 17 minutes of face-to-face time during this encounter and > 50% was spent counseling as documented under my assessment and plan.   [name], MD  [facility] DE[facility] OF [facility] [address] [place] [address] Dept: [phone] Dept Fax: [phone]   No orders of the defined types were placed in this encounter.   CHIEF COMPLAINT:  CC: Stage IIA Breast cancer  Current Treatment: IV ceftriaxone   HISTORY OF PRESENT ILLNESS:  Doris Reyes 73 y.o. 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  She does have annual mammograms because of her strong family history, but was on hormone replacement therapy for 10 years.  She had a screening mammogram at [facility] on  May 05, 2021 which revealed architectural distortion which was felt to be indeterminant.  She had a diagnostic mammogram on March 3 which showed mild nipple retraction and a 1 cm area of focal asymmetry in the retroareolar region.  Ultrasound revealed a hypoechoic irregular mass measuring 9 mm in that area.  On March 14 she had a biopsy performed which revealed a 0.9 cm irregular mass with spiculated margins in the right breast at 12:00, approximately 2 cm from the nipple and anteriorly in the breast.  This was found to be grade 2 invasive mammary carcinoma as well as mammary carcinoma in situ.  The E-cadherin immunohistochemistry stains were negative and so this is consistent with invasive lobular carcinoma.  The estrogen receptors were positive at 95% and progesterone receptors  positive at 95% with HER2 by immunohistochemistry positive at 2+ but FISH was negative.  Ki-67 was less than 5%.  The carcinoma in situ was positive for E-cadherin, consistent with ductal carcinoma in situ.  She had an MRI of the breast and was found to have focal enhancement in the right breast surrounding the biopsy clip and some non-mass enhancement posterior to the malignancy.  It was recommended that she have an MRI guided biopsy of the clumped non-mass enhancement area and this revealed invasive lobular carcinoma grade 1 with lobular carcinoma in situ and extensive fibrocystic changes with focal intraluminal microcalcification and intraductal papillomatosis.   Due to her strong family history including multiple females with breast cancer in her mother with colon cancer at age 73, she did have genetic testing with [facility] and was found to have a carrier mutation of MUTYH.  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  It was recommended that she have colonoscopy beginning at age 60 or 10 years prior to the age of her first-degree use relatives age at colorectal cancer diagnosis.  It was recommended this be repeated every 5 years.  This was explained to her.   She has now had a lumpectomy but had to go back for reexcision of the margins.  The sentinel lymph node was negative but she did have 2 primaries, measuring 2.4 cm and 2.2 cm, for a T2 N0 M0, stage IIA. This was ER/PR positive, HER 2 negative and a Ki-67 was 5%.  Even though she has several favorable characteristics, such as grade 1 histology and a low Ki 67, I still recommended that we pursue Endopredict testing to quantitate her risk for recurrence, and fortunately she has an EpClin score of 2.6, low risk. This correlates with a 5.1% risk of distant recurrence in the next 10 years, with only a 1.0% benefit of chemotherapy. Her  risk of late recurrence is 4.0%. I recommended hormonal therapy but she refused.   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 PET scan revealed metastatic left inguinal lymph nodes up to 1.8 cm in diameter, but no additional evidence of metastatic disease in the neck, chest, abdomen or pelvis. There was a 4 mm right lower lobe nodule, too small for PET resolution.  Patient had a pelvic vulvectomy, bilateral debulking, radical lymphadenectomy, and inguinal dissection done on 06/28/2023.  Pathology revealed 3/4 positive nodes on the left side, up to 3 cm and with extensive extranodal tumor, and negative nodes on the right side. The vulvar carcinoma measured 3 cm with  depth of invasion 5 mm and carcinoma in situ, for a T1b N2c M0. She had lymphovascular invasion and carcinoma in situ at the 6 o'clock margin with < 1 mm margin for invasive carcinoma.Diagnostic bilateral mammogram done on 05/04/2023 was clear. Bone density scan done on 07/17/2023 revealed osteopenia.  Oncology History  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)  06/08/2021 Initial Diagnosis   Breast cancer in female [facility])   07/23/2021 Genetic Testing   Negative hereditary cancer genetic testing: no pathogenic variants detected in [facility]CAPl[facility] Panel. Report date is July 23, 2021.  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  The report date is Jul 26, 2021.  The BRCAplus panel offered by [facility] [facility] and includes sequencing and deletion/duplication analysis for the following 8 genes: ATM, BRCA1, BRCA2, CDH1, CHEK2, PALB2, PTEN, and TP53.  Results of pan-cancer panel pending.   The CancerNext-Expanded gene panel offered by [facility] and includes sequencing and rearrangement analysis for the following 77 genes: AIP, ALK, APC*, ATM*, AXIN2,  BAP1, BARD1, BLM, BMPR1A, BRCA1*, BRCA2*, BRIP1*, CDC73, CDH1*, CDK4, CDKN1B, CDKN2A, CHEK2*, CTNNA1, DICER1, FANCC, FH, FLCN, GALNT12, KIF1B, LZTR1, MAX, MEN1, MET, MLH1*, MSH2*, MSH3, MSH6*, MUTYH*, NBN, NF1*, NF2, NTHL1, PALB2*, PHOX2B, PMS2*, POT1, PRKAR1A, PTCH1, PTEN*, RAD51C*, RAD51D*, RB1, RECQL, RET, SDHA, SDHAF2, SDHB, SDHC, SDHD, SMAD4, SMARCA4, SMARCB1, SMARCE1, STK11, SUFU, TMEM127, TP53*, TSC1, TSC2, VHL and XRCC2 (sequencing and deletion/duplication); EGFR, EGLN1, HOXB13, KIT, MITF, PDGFRA, POLD1, and POLE (sequencing only); EPCAM and GREM1 (deletion/duplication only). DNA and RNA analyses performed for * genes.    08/23/2021 Cancer Staging   Staging form: Breast, AJCC 8th Edition - Pathologic stage from 08/23/2021: Stage IA (pT2(2), pN0(sn), cM0, G1, ER+, PR+, HER2-) - Signed by Cornelius Wanda DEL, MD on 09/29/2021 Histopathologic type: Lobular carcinoma, NOS Stage prefix: Initial diagnosis Method of lymph node assessment: Sentinel lymph node biopsy Nuclear grade: G1 Multigene prognostic tests performed: EndoPredict Histologic grading system: 3 grade system Residual tumor (R): R0 - None Laterality: Right Tumor size (mm): 24 Multiple tumors: Yes Number of tumors: 2 Lymph-vascular invasion (LVI): LVI not present (absent)/not identified Diagnostic confirmation: Positive histology PLUS positive immunophenotyping and/or positive genetic studies Specimen type: Excision Staged by: Managing physician Menopausal status: Postmenopausal Ki-67 (%): 5 Stage used in treatment planning: Yes National guidelines used in treatment planning: Yes Type of national guideline used in treatment planning: NCCN   Vulvar cancer (HCC)  06/28/2023 Initial Diagnosis   Vulvar cancer (HCC)   06/28/2023 Cancer Staging   Staging form: Vulva, AJCC V9 - Clinical stage from 06/28/2023: FIGO Stage IIIC (cT1b, cN1c, cM0) - Signed by Cornelius Wanda DEL, MD on 07/31/2023 Histopathologic type: Squamous cell  carcinoma, NOS Stage prefix: Initial diagnosis Method of lymph node assessment: Lymph node dissection Histologic grade (G): G2 Histologic grading system: 3 grade system Tumor size (mm): 30 Lymph-vascular invasion (LVI): LVI present/identified, NOS Diagnostic confirmation: Positive histology Specimen type: Excision Staged by: Managing physician Femoral-inguinal nodal status: Positive Solitary (s) or multifocal (m) tumors in the primary site: Solitary Perineural invasion (PNI): Unknown Stage used in treatment planning: Yes National guidelines used in treatment planning: Yes Type of national guideline used in treatment planning: NCCN   09/04/2023 - 09/18/2023 Chemotherapy   Patient is on Treatment Plan : CERVICAL Pembrolizumab  q21d + XRT (cisplatin  d/c'd 09/25/23)     12/22/2023 - 12/22/2023 Chemotherapy   Patient is on Treatment Plan : CERVICAL  Pembrolizumab  (200) q21d      INTERVAL HISTORY:  Doris Reyes is here today for repeat clinical assessment for her Breast cancer, stage IIA breast cancer. Patient states that she feels okay, but complains of vaginal redness and itching She denies any vaginal discharge. She informed me that her cellulitis was improving until she had a fall 2 weeks ago. Melissa placed her on antibiotics for the increased inflammation, but she has discontinued her Amoxicillin  due to IBS. She has an appointment with infectious disease on 12/25/2023 for further evaluation. She also has her follow-up with GYN oncology next week. Fortunately, the cellulitis of the genitalia seems to be improving now after multiple flares. She visited urgent care on 12/15/2023 for a productive cough which was found to be acute viral bronchitis. She was instructed to continue fluticasone  and Zyrtec, and add Tussionex as needed, but she has not taken the Tussionex in 2 days as the cough has now resolved. Right anterior tibia skin biopsy performed  and I will request the pathology. I will place her on 200 mg  daily Diflucan  for 10 days. I instructed her to pause her Zocor  while she is taking the antifungal medication. She had a CT abdomen pelvis performed on 12/07/2023 which revealed no acute intra-abdominal or pelvic pathology, sigmoid diverticulosis with a normal appendix and no bowel obstruction, thickening of the skin and subcutaneous stranding over the mons pubis and medial proximal left thigh which may represent cellulitis with no drainable fluid collection/abscess or soft tissue gas, and aortic atherosclerosis. She has a WBC of 4.9, a stable low hemoglobin of 10.2, and platelet count of 256,000. Her CMP is normal other than an elevated creatinine of 1.11, improved from 1.18. Her magnesium  is normal at 2.1. She will receive 1 liter of IV normal saline. I will see her back in 2 weeks with CBC and CMP.  She denies fever, chills, night sweats, or other signs of infection. She denies cardiorespiratory and gastrointestinal issues. She  denies pain. Her appetite is good and Her weight has decreased 2 pounds over last 2 weeks.  REVIEW OF SYSTEMS:   Review of Systems  Constitutional:  Positive for diaphoresis (night sweats). Negative for appetite change, chills, fatigue, fever and unexpected weight change.  HENT:  Negative.  Negative for lump/mass, mouth sores and sore throat.   Eyes:  Negative for eye problems.  Respiratory:  Negative for chest tightness, cough, hemoptysis, shortness of breath and wheezing.   Cardiovascular: Negative.  Negative for chest pain, leg swelling and palpitations.  Gastrointestinal: Negative.  Negative for abdominal distention, abdominal pain, blood in stool, constipation, diarrhea, nausea and vomiting.  Endocrine: Negative.   Genitourinary: Negative.  Negative for difficulty urinating, dysuria, frequency, hematuria, vaginal bleeding and vaginal discharge.        Mild erythema of the genitalia with severe itching  Musculoskeletal:  Negative for arthralgias, back pain, flank pain,  gait problem and myalgias.  Skin: Negative.  Negative for rash.  Neurological:  Negative for dizziness, extremity weakness, gait problem, headaches, light-headedness, numbness, seizures and speech difficulty.  Hematological: Negative.  Negative for adenopathy. Does not bruise/bleed easily.  Psychiatric/Behavioral: Negative.  Negative for depression and sleep disturbance. The patient is not nervous/anxious.    VITALS:   Vitals:   12/21/23 1118  BP: 132/71  Pulse: (!) 56  Resp: 16  Temp: 98 F (36.7 C)  SpO2: 100%    Wt Readings from Last 3 Encounters:  12/28/23 205 lb (93 kg)  12/25/23 210 lb (95.3  kg)  12/21/23 208 lb 8 oz (94.6 kg)    Body mass index is 36.93 kg/m.  Performance status (ECOG): 1 - Symptomatic but completely ambulatory  PHYSICAL EXAM:  Physical Exam Vitals and nursing note reviewed.  Constitutional:      General: She is not in acute distress.    Appearance: Normal appearance. She is normal weight. She is not ill-appearing.  HENT:     Head: Normocephalic and atraumatic.     Right Ear: Tympanic membrane, ear canal and external ear normal. There is no impacted cerumen.     Left Ear: Tympanic membrane, ear canal and external ear normal. There is no impacted cerumen.     Nose: Nose normal. No congestion or rhinorrhea.     Mouth/Throat:     Mouth: Mucous membranes are moist.     Pharynx: Oropharynx is clear. No oropharyngeal exudate or posterior oropharyngeal erythema.  Eyes:     General: No scleral icterus.       Right eye: No discharge.        Left eye: No discharge.     Extraocular Movements: Extraocular movements intact.     Conjunctiva/sclera: Conjunctivae normal.     Pupils: Pupils are equal, round, and reactive to light.  Cardiovascular:     Rate and Rhythm: Normal rate and regular rhythm.     Pulses: Normal pulses.     Heart sounds: Normal heart sounds. No murmur heard.    No friction rub. No gallop.  Pulmonary:     Effort: Pulmonary effort is  normal. No respiratory distress.     Breath sounds: Normal breath sounds. No stridor. No wheezing, rhonchi or rales.  Abdominal:     General: Bowel sounds are normal. There is no distension.     Palpations: Abdomen is soft. There is no hepatomegaly, splenomegaly or mass.     Tenderness: There is no abdominal tenderness. There is no right CVA tenderness, left CVA tenderness, guarding or rebound.     Hernia: No hernia is present.  Genitourinary:    Comments: Pinkness of the anterior genitalia, much improved, with no exudate. Musculoskeletal:        General: Normal range of motion.     Cervical back: Normal range of motion and neck supple. No tenderness.     Right lower leg: No edema.     Left lower leg: No edema.  Lymphadenopathy:     Cervical: No cervical adenopathy.     Right cervical: No superficial, deep or posterior cervical adenopathy.    Left cervical: No superficial, deep or posterior cervical adenopathy.     Upper Body:     Right upper body: No supraclavicular, axillary or pectoral adenopathy.     Left upper body: No supraclavicular, axillary or pectoral adenopathy.  Skin:    General: Skin is warm and dry.     Coloration: Skin is not jaundiced.     Findings: Lesion present. No erythema or rash.     Comments: Right anterior lower tibia has a crusted keratotic lesion of the skin, which she says may be enlarging. This has now been biopsied so there is an ulceration at the site of the punch biopsy.  Mild decrease in skin turgor.  Neurological:     General: No focal deficit present.     Mental Status: She is alert and oriented to person, place, and time. Mental status is at baseline.     Cranial Nerves: No cranial nerve deficit.  Psychiatric:  Mood and Affect: Mood normal.        Behavior: Behavior normal.        Thought Content: Thought content normal.        Judgment: Judgment normal.    LABS:      Latest Ref Rng & Units 12/21/2023   11:00 AM 12/06/2023    9:27 AM  11/28/2023   10:35 AM  CBC  WBC 4.0 - 10.5 K/uL 4.9  9.7  4.5   Hemoglobin 12.0 - 15.0 g/dL 89.7  89.7  89.2   Hematocrit 36.0 - 46.0 % 31.5  29.7  32.7   Platelets 150 - 400 K/uL 256  135  178       Latest Ref Rng & Units 12/21/2023   11:00 AM 12/06/2023    9:27 AM 11/28/2023   10:35 AM  CMP  Glucose 70 - 99 mg/dL 98  887  99   BUN 8 - 23 mg/dL 23  18  20    Creatinine 0.44 - 1.00 mg/dL 8.88  8.81  8.83   Sodium 135 - 145 mmol/L 141  135  139   Potassium 3.5 - 5.1 mmol/L 4.2  3.5  4.4   Chloride 98 - 111 mmol/L 106  100  104   CO2 22 - 32 mmol/L 24  22  23    Calcium  8.9 - 10.3 mg/dL 9.6  9.2  9.5   Total Protein 6.5 - 8.1 g/dL 6.7  6.2  6.4   Total Bilirubin 0.0 - 1.2 mg/dL 0.3  0.5  0.3   Alkaline Phos 38 - 126 U/L 88  73  73   AST 15 - 41 U/L 20  19  22    ALT 0 - 44 U/L 17  19  18     Lab Results  Component Value Date   TSH 0.815 10/25/2023   T4TOTAL 7.9 10/25/2023   Lab Results  Component Value Date   TIBC 441 10/11/2023   FERRITIN 65 10/11/2023   IRONPCTSAT 14 10/11/2023     STUDIES:  EXAM: 12/07/2023 CT ABDOMEN AND PELVIS WITH CONTRAST IMPRESSION: 1. No acute intra-abdominal or pelvic pathology. 2. Sigmoid diverticulosis. No bowel obstruction. Normal appendix. 3. Thickening of the skin and subcutaneous stranding over the mons pubis and medial proximal left thigh scratch may represent cellulitis. No drainable fluid collection/abscess. No soft tissue gas. 4.  Aortic Atherosclerosis (ICD10-I70.0).  EXAM: 07/17/2023 DUAL X-RAY ABSORPTIOMETRY (DXA) FOR BONE MINERAL DENSITY LUMBAR SPINE (L2-L4): BMD (in g/cm2): 0.912 T-score: -2.4 Z-score: -0.7   LEFT FEMORAL NECK: BMD (in g/cm2): 0.793 T-score: -1.8 Z-score: 0.0   LEFT TOTAL HIP: BMD (in g/cm2): 0.922 T-score: -0.7 Z-score: 0.9   RIGHT FEMORAL NECK: BMD (in g/cm2): 0.855 T-score: -1.3 Z-score: 0.5   RIGHT TOTAL HIP: BMD (in g/cm2): 0.896 T-score: -0.9 Z-score: 0.7   EXAM: 06/05/2023 NUCLEAR  MEDICINE PET SKULL BASE TO THIGH IMPRESSION: 1. Metastatic left inguinal lymph nodes. No additional evidence of metastatic disease in the neck, chest, abdomen or pelvis. 2. 4 mm right lower lobe nodule, too small for PET resolution. Recommend attention on follow-up. 3. Small to moderate hiatal hernia. 4.  Aortic atherosclerosis (ICD10-I70.0).  HISTORY:   Past Medical History:  Diagnosis Date   Anemia    Anxiety    Cancer (HCC)    right breast ILC   Complication of anesthesia    difficulty waking up   Depression    Diverticulitis    Family history of breast cancer  Family history of ovarian cancer    Family history of prostate cancer    Fibromyalgia    GERD (gastroesophageal reflux disease)    Heart murmur    History of colonic polyps    History of hiatal hernia    Hyperlipidemia    Hypertension    IBS (irritable bowel syndrome)    Ischemic colitis 03/2012   Osteoporosis    Pre-diabetes    RLS (restless legs syndrome)    Sinusitis    Sleep apnea    CPAP nightly    Past Surgical History:  Procedure Laterality Date   BREAST LUMPECTOMY WITH RADIOACTIVE SEED LOCALIZATION Right 08/04/2021   Procedure: RIGHT BREAST LUMPECTOMY WITH RADIOACTIVE SEED LOCALIZATION;  Surgeon: Vanderbilt Ned, MD;  Location: [facility];  Service: General;  Laterality: Right;   CHOLECYSTECTOMY  2004   COLONOSCOPY WITH PROPOFOL  N/A 04/18/2017   Procedure: COLONOSCOPY WITH PROPOFOL ;  Surgeon: Kristie Lamprey, MD;  Location: [facility];  Service: Endoscopy;  Laterality: N/A;   ESOPHAGOGASTRODUODENOSCOPY (EGD) WITH PROPOFOL  N/A 04/18/2017   Procedure: ESOPHAGOGASTRODUODENOSCOPY (EGD) WITH PROPOFOL ;  Surgeon: Kristie Lamprey, MD;  Location: [facility];  Service: Endoscopy;  Laterality: N/A;   EXAM UNDER ANESTHESIA, PELVIC N/A 06/28/2023   Procedure: EXAM UNDER ANESTHESIA, PELVIC;  Surgeon: Viktoria Comer SAUNDERS, MD;  Location: [facility];  Service: Gynecology;  Laterality: N/A;   FLEXIBLE SIGMOIDOSCOPY   04/20/2012   Procedure: FLEXIBLE SIGMOIDOSCOPY;  Surgeon: Belvie JONETTA Just, MD;  Location: [facility];  Service: Endoscopy;  Laterality: N/A;   INGUINAL LYMPHADENECTOMY N/A 06/28/2023   Procedure: LYMPHADENECTOMY, INGUINAL, OPEN;  Surgeon: Viktoria Comer SAUNDERS, MD;  Location: [facility];  Service: Gynecology;  Laterality: N/A;   IR IMAGING GUIDED PORT INSERTION  07/31/2023   NISSEN FUNDOPLICATION  2011   PARAESOPHAGEAL HERNIA REPAIR  09/11/2009   and Nissen fundoplication   RADICAL VULVECTOMY N/A 06/28/2023   Procedure: VULVECTOMY, RADICAL;  Surgeon: Viktoria Comer SAUNDERS, MD;  Location: [facility];  Service: Gynecology;  Laterality: N/A;  Possible skin flap   RE-EXCISION OF BREAST LUMPECTOMY Right 08/18/2021   Procedure: RE-EXCISION RIGHT BREAST LUMPECTOMY;  Surgeon: Vanderbilt Ned, MD;  Location: [facility];  Service: General;  Laterality: Right;   SENTINEL NODE BIOPSY N/A 08/04/2021   Procedure: SENTINEL NODE BIOPSY;  Surgeon: Vanderbilt Ned, MD;  Location: [facility];  Service: General;  Laterality: N/A;   TONSILLECTOMY  1960's   TOTAL ABDOMINAL HYSTERECTOMY  2001   w/ BSO    Family History  Problem Relation Age of Onset   Colon cancer Mother 27   Alzheimer's disease Mother    Heart attack Father    Cancer Maternal Aunt        x2   Alzheimer's disease Maternal Aunt    Ovarian cancer Maternal Aunt    Prostate cancer Maternal Uncle    Kidney disease Maternal Uncle    Atrial fibrillation Maternal Uncle    Stroke Paternal Uncle    Aneurysm Paternal Grandmother        brain   Stroke Paternal Grandfather    Prostate cancer Cousin        paternal first cousin   Prostate cancer Cousin        paternal first cousin   Esophageal cancer Cousin        maternal first cousin   Breast cancer Cousin        DCIS, maternal first cousin   Ovarian cancer Cousin        maternal first cousin  Rheum arthritis Other    Lung disease Neg Hx     Social History:  reports that she has never  smoked. She has never used smokeless tobacco. She reports that she does not drink alcohol and does not use drugs.The patient is alone today.  Allergies:  Allergies  Allergen Reactions   Cefuroxime Other (See Comments), Diarrhea and Nausea Only    IBS   Sulfa  Antibiotics Nausea And Vomiting and Nausea Only   Tramadol Other (See Comments) and Nausea Only   Ceftin [Cefuroxime Axetil] Diarrhea   Compazine  [Prochlorperazine ] Other (See Comments)    Vision problems   Ondansetron  Hcl    Other Other (See Comments)    Pneumonia vaccine (uncoded)   Tramadol Hcl Nausea And Vomiting   Zofran  [Ondansetron ] Other (See Comments)    Vision changes    Current Medications: Current Outpatient Medications  Medication Sig Dispense Refill   loratadine  (CLARITIN ) 10 MG tablet 1 tablet Orally Once a day as needed; Duration: 30 day(s) (Patient not taking: Reported on 12/25/2023)     acetaminophen  (TYLENOL ) 650 MG CR tablet 1,300 mg in the morning and at bedtime.     amLODipine  (NORVASC ) 5 MG tablet Take 5 mg by mouth daily.     Ascorbic Acid (VITAMIN C) 500 MG CHEW Chew 500 mg by mouth daily.     aspirin EC 81 MG tablet Take 81 mg by mouth at bedtime. Swallow whole.     atenolol  (TENORMIN ) 50 MG tablet Take 75 mg by mouth daily.      benazepril  (LOTENSIN ) 10 MG tablet Take 10 mg by mouth daily.     benzonatate (TESSALON) 100 MG capsule Take 100-200 mg by mouth at bedtime.     Biotin 1000 MCG tablet Take 1,000 mcg by mouth daily.     Calcium  Citrate-Vitamin D (CITRACAL + D PO) Take 1 tablet by mouth daily.     cetirizine (ZYRTEC) 10 MG tablet Take 10 mg by mouth at bedtime.     chlorpheniramine-HYDROcodone (TUSSIONEX) 10-8 MG/5ML Take 5 mLs by mouth every 12 (twelve) hours as needed for cough. 90 mL 0   Cholecalciferol (VITAMIN D) 50 MCG (2000 UT) tablet Take 2,000 Units by mouth daily.     cyanocobalamin  1000 MCG tablet Take 1,000 mcg by mouth daily.     diclofenac sodium (VOLTAREN) 1 % GEL Apply 1  application  topically daily as needed (for pain).     dicyclomine (BENTYL) 10 MG capsule Take 1 capsule by mouth as needed. (Patient not taking: Reported on 12/25/2023)     diphenhydrAMINE  (BENADRYL ) 25 MG tablet Take 25 mg by mouth every 6 (six) hours as needed for allergies.     diphenoxylate-atropine (LOMOTIL) 2.5-0.025 MG tablet Take 2 tablets by mouth 4 (four) times daily as needed for diarrhea or loose stools.     DULoxetine  (CYMBALTA ) 60 MG capsule 2 capsules Orally once a day     EPINEPHrine  (EPIPEN  2-PAK) 0.3 mg/0.3 mL IJ SOAJ injection Inject 0.3 mg into the muscle as needed for anaphylaxis.     famotidine  (PEPCID ) 20 MG tablet Take 20 mg by mouth at bedtime.     fexofenadine (ALLEGRA) 180 MG tablet Take 180 mg by mouth in the morning.     fluconazole  (DIFLUCAN ) 200 MG tablet Take 1 tablet (200 mg total) by mouth daily for 10 days. 10 tablet 0   fluticasone  (FLONASE ) 50 MCG/ACT nasal spray Place 1 spray into both nostrils daily.     lidocaine  (XYLOCAINE )  5 % ointment Apply 1 Application topically 2 (two) times daily as needed for moderate pain (pain score 4-6) (to the vulva). Apply to vulva as needed for discomfort 35.44 g 3   lidocaine  4 % Place 1 patch onto the skin daily.     linezolid (ZYVOX) 600 MG tablet Take 1 tablet (600 mg total) by mouth 2 (two) times daily for 7 days. 14 tablet 0   linezolid (ZYVOX) 600 MG tablet Take 1 tablet (600 mg total) by mouth 2 (two) times daily. 14 tablet 0   LORazepam  (ATIVAN ) 1 MG tablet Take 1 tablet (1 mg total) by mouth every 8 (eight) hours. (Patient not taking: Reported on 12/25/2023) 30 tablet 0   NON FORMULARY Pt uses a c-pap nightly     Nutritional Supplements (JUICE PLUS FIBRE PO) Take 2 each by mouth daily. Fruits and Vegetables     nystatin  (MYCOSTATIN ) 100000 UNIT/ML suspension Take 5 mLs (500,000 Units total) by mouth 4 (four) times daily. (Patient not taking: Reported on 12/25/2023) 60 mL 2   nystatin  (MYCOSTATIN /NYSTOP ) powder Apply 1  Application topically 3 (three) times daily. 30 g 5   Omega-3 Fatty Acids (FISH OIL) 1000 MG CAPS Take 1,000 mg by mouth daily.      oxyCODONE -acetaminophen  (PERCOCET/ROXICET) 5-325 MG tablet Take 1-2 tablets by mouth every 4 (four) hours as needed for severe pain (pain score 7-10). (Patient not taking: Reported on 12/25/2023) 90 tablet 0   pantoprazole  (PROTONIX ) 40 MG tablet Take 40 mg by mouth every evening.     Phenylephrine -APAP-Guaifenesin (TYLENOL  SINUS SEVERE PO) Take 2 tablets by mouth daily as needed (drainage).     polyvinyl alcohol (LIQUIFILM TEARS) 1.4 % ophthalmic solution Place 1 drop into both eyes 5 (five) times daily.     Probiotic Product (PROBIOTIC & ACIDOPHILUS EX ST PO) Take 1 capsule by mouth daily. Ultra Flora Plus Capsules     Propylene Glycol (SYSTANE COMPLETE) 0.6 % SOLN Place 1 drop into both eyes in the morning and at bedtime.     rOPINIRole  (REQUIP ) 0.5 MG tablet Take 0.5 mg by mouth at bedtime.     senna-docusate (SENOKOT-S) 8.6-50 MG tablet Take 2 tablets by mouth at bedtime. For AFTER surgery, do not take if having diarrhea 30 tablet 0   simvastatin  (ZOCOR ) 10 MG tablet Take 10 mg by mouth at bedtime.       sucralfate  (CARAFATE ) 1 GM/10ML suspension Take 1 g by mouth 2 (two) times daily.     traZODone  (DESYREL ) 50 MG tablet Take 50 mg by mouth at bedtime.     triamcinolone  (KENALOG ) 0.025 % ointment Apply 1 Application topically 2 (two) times daily. 30 g 0   vitamin E 400 UNIT capsule Take 400 Units by mouth daily.       White Petrolatum -Mineral Oil (REFRESH P.M. OP) Place 1 Application into both eyes at bedtime.     No current facility-administered medications for this visit.    [name], MD  [facility] OF [facility] [address] [place] [address] Dept: [phone] Dept Fax: [phone]    I,Sonja Manseau H Chetan Mehring,acting as a scribe for [name], MD.,have  documented all relevant documentation on the behalf of [name], MD,as directed by  [name], MD while in the presence of [name], MD.

## 2023-12-21 NOTE — Telephone Encounter (Signed)
 Patient has been scheduled for follow-up visit per 12/21/23 LOS.  Pt given an appt calendar with date and time.

## 2023-12-25 ENCOUNTER — Ambulatory Visit (INDEPENDENT_AMBULATORY_CARE_PROVIDER_SITE_OTHER): Admitting: Internal Medicine

## 2023-12-25 ENCOUNTER — Encounter: Payer: Self-pay | Admitting: Internal Medicine

## 2023-12-25 ENCOUNTER — Other Ambulatory Visit (HOSPITAL_BASED_OUTPATIENT_CLINIC_OR_DEPARTMENT_OTHER): Payer: Self-pay

## 2023-12-25 ENCOUNTER — Other Ambulatory Visit: Payer: Self-pay

## 2023-12-25 ENCOUNTER — Other Ambulatory Visit (HOSPITAL_COMMUNITY): Payer: Self-pay

## 2023-12-25 VITALS — BP 139/86 | HR 64 | Temp 98.5°F | Wt 210.0 lb

## 2023-12-25 DIAGNOSIS — L03311 Cellulitis of abdominal wall: Secondary | ICD-10-CM

## 2023-12-25 MED ORDER — LINEZOLID 600 MG PO TABS
600.0000 mg | ORAL_TABLET | Freq: Two times a day (BID) | ORAL | 0 refills | Status: AC
Start: 2023-12-25 — End: 2024-01-01
  Filled 2023-12-25: qty 14, 7d supply, fill #0

## 2023-12-25 MED ORDER — LINEZOLID 600 MG PO TABS
600.0000 mg | ORAL_TABLET | Freq: Two times a day (BID) | ORAL | 0 refills | Status: DC
  Filled 2023-12-25: qty 14, 7d supply, fill #0

## 2023-12-25 NOTE — Progress Notes (Signed)
 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   Genetic testing 07/26/2021   Family history of breast cancer 07/14/2021   Family history of ovarian cancer 07/14/2021   Family history of prostate cancer 07/14/2021   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) 06/08/2021    Class: Diagnosis of   GERD (gastroesophageal reflux disease) 12/11/2014   Fibromyalgia 12/11/2014   Allergic rhinitis 12/11/2014   Cough 12/11/2014   Generalized anxiety disorder 12/11/2014   Hyperlipidemia 12/11/2014   Vitamin D deficiency 12/11/2014   Esophageal spasm 12/11/2014   IBS (irritable bowel syndrome) 12/11/2014   Essential hypertension 12/11/2014   History of colonic polyps 12/11/2014   OSA (obstructive sleep apnea) 12/11/2014   Vomiting 10/06/2010     Subjective:  [name] is a 73 y.o. fematle eith history of vulvar cancer stage IIIc, osteopenia, referred by hematology/oncology for recurrent vulvar cellulitis.  In August 2024 patient was found to have abnormal Pap smear, biopsy testing done revealed stage III squamous cell..  She had a pelvic of valvectomy, bilateral debulking, radical lymphadenectomy, inguinal dissection on/2/25.  Pathology revealed 3 out of 4 positive nodes, extensive extranodal tumor.  Moderate differential c squamous cell carcinoma.  She was given Bactrim  as well for surgical site  infection.  She had some nausea/vomiting with it as such did not take the pills consistently.  She is followed by [facility] as well.  She was seen on 4/25 noted to have continue sitz bath's.  She was given a course of amoxicillin  then received Rocephin .  Last seen on 9/11 with oncology.  It was noted that patient has been hospitalized for cellulitis of left bovine thigh she developed significant shingles of the left thigh as well.  Discharged on Doxy and valacyclovir.  Blood cultures were taken that showed no growth. Review of Systems  All other systems reviewed and are negative.   Past Medical History:  Diagnosis Date   Anemia    Anxiety    Cancer (HCC)    right breast ILC   Complication of anesthesia    difficulty waking up   Depression    Diverticulitis    Family history of breast cancer    Family history of ovarian cancer    Family history of prostate cancer    Fibromyalgia    GERD (gastroesophageal reflux disease)    Heart murmur    History of colonic polyps    History of hiatal hernia    Hyperlipidemia    Hypertension    IBS (irritable bowel syndrome)    Ischemic colitis 03/2012   Osteoporosis    Pre-diabetes    RLS (restless legs syndrome)    Sinusitis    Sleep apnea    CPAP nightly    Outpatient Medications Prior to Visit  Medication Sig Dispense Refill   acetaminophen  (TYLENOL ) 650 MG CR tablet 1,300 mg in  the morning and at bedtime.     amLODipine  (NORVASC ) 5 MG tablet Take 5 mg by mouth daily.     Ascorbic Acid (VITAMIN C) 500 MG CHEW Chew 500 mg by mouth daily.     aspirin EC 81 MG tablet Take 81 mg by mouth at bedtime. Swallow whole.     atenolol  (TENORMIN ) 50 MG tablet Take 75 mg by mouth daily.      benazepril  (LOTENSIN ) 10 MG tablet Take 10 mg by mouth daily.     benzonatate (TESSALON) 100 MG capsule Take 100-200 mg by mouth at bedtime.     Biotin 1000 MCG tablet Take 1,000 mcg by mouth daily.     Calcium  Citrate-Vitamin D (CITRACAL + D PO) Take 1 tablet  by mouth daily.     cetirizine (ZYRTEC) 10 MG tablet Take 10 mg by mouth at bedtime.     chlorpheniramine-HYDROcodone (TUSSIONEX) 10-8 MG/5ML Take 5 mLs by mouth every 12 (twelve) hours as needed for cough. 90 mL 0   Cholecalciferol (VITAMIN D) 50 MCG (2000 UT) tablet Take 2,000 Units by mouth daily.     cyanocobalamin  1000 MCG tablet Take 1,000 mcg by mouth daily.     diclofenac sodium (VOLTAREN) 1 % GEL Apply 1 application  topically daily as needed (for pain).     DULoxetine  (CYMBALTA ) 60 MG capsule 2 capsules Orally once a day     EPINEPHrine  (EPIPEN  2-PAK) 0.3 mg/0.3 mL IJ SOAJ injection Inject 0.3 mg into the muscle as needed for anaphylaxis.     famotidine  (PEPCID ) 20 MG tablet Take 20 mg by mouth at bedtime.     fexofenadine (ALLEGRA) 180 MG tablet Take 180 mg by mouth in the morning.     fluconazole  (DIFLUCAN ) 200 MG tablet Take 1 tablet (200 mg total) by mouth daily for 10 days. 10 tablet 0   fluticasone  (FLONASE ) 50 MCG/ACT nasal spray Place 1 spray into both nostrils daily.     lidocaine  4 % Place 1 patch onto the skin daily.     dicyclomine (BENTYL) 10 MG capsule Take 1 capsule by mouth as needed. (Patient not taking: Reported on 12/25/2023)     diphenhydrAMINE  (BENADRYL ) 25 MG tablet Take 25 mg by mouth every 6 (six) hours as needed for allergies.     diphenoxylate-atropine (LOMOTIL) 2.5-0.025 MG tablet Take 2 tablets by mouth 4 (four) times daily as needed for diarrhea or loose stools.     lidocaine  (XYLOCAINE ) 5 % ointment Apply 1 Application topically 2 (two) times daily as needed for moderate pain (pain score 4-6) (to the vulva). Apply to vulva as needed for discomfort 35.44 g 3   loratadine  (CLARITIN ) 10 MG tablet 1 tablet Orally Once a day as needed; Duration: 30 day(s)     LORazepam  (ATIVAN ) 1 MG tablet Take 1 tablet (1 mg total) by mouth every 8 (eight) hours. 30 tablet 0   NON FORMULARY Pt uses a c-pap nightly     Nutritional Supplements (JUICE PLUS FIBRE PO) Take 2 each by  mouth daily. Fruits and Vegetables     nystatin  (MYCOSTATIN ) 100000 UNIT/ML suspension Take 5 mLs (500,000 Units total) by mouth 4 (four) times daily. 60 mL 2   nystatin  (MYCOSTATIN /NYSTOP ) powder Apply 1 Application topically 3 (three) times daily. 30 g 5   Omega-3 Fatty Acids (FISH OIL) 1000 MG CAPS Take 1,000 mg by mouth daily.      oxyCODONE -acetaminophen  (PERCOCET/ROXICET) 5-325 MG tablet Take 1-2 tablets by mouth every 4 (four)  hours as needed for severe pain (pain score 7-10). 90 tablet 0   pantoprazole  (PROTONIX ) 40 MG tablet Take 40 mg by mouth every evening.     Phenylephrine -APAP-Guaifenesin (TYLENOL  SINUS SEVERE PO) Take 2 tablets by mouth daily as needed (drainage).     polyvinyl alcohol (LIQUIFILM TEARS) 1.4 % ophthalmic solution Place 1 drop into both eyes 5 (five) times daily.     Probiotic Product (PROBIOTIC & ACIDOPHILUS EX ST PO) Take 1 capsule by mouth daily. Ultra Flora Plus Capsules     Propylene Glycol (SYSTANE COMPLETE) 0.6 % SOLN Place 1 drop into both eyes in the morning and at bedtime.     rOPINIRole  (REQUIP ) 0.5 MG tablet Take 0.5 mg by mouth at bedtime.     senna-docusate (SENOKOT-S) 8.6-50 MG tablet Take 2 tablets by mouth at bedtime. For AFTER surgery, do not take if having diarrhea 30 tablet 0   simvastatin  (ZOCOR ) 10 MG tablet Take 10 mg by mouth at bedtime.       sucralfate  (CARAFATE ) 1 GM/10ML suspension Take 1 g by mouth 2 (two) times daily.     traZODone  (DESYREL ) 50 MG tablet Take 50 mg by mouth at bedtime.     triamcinolone  (KENALOG ) 0.025 % ointment Apply 1 Application topically 2 (two) times daily. 30 g 0   vitamin E 400 UNIT capsule Take 400 Units by mouth daily.       White Petrolatum -Mineral Oil (REFRESH P.M. OP) Place 1 Application into both eyes at bedtime.     No facility-administered medications prior to visit.     Allergies  Allergen Reactions   Cefuroxime Other (See Comments), Diarrhea and Nausea Only    IBS   Sulfa  Antibiotics Nausea And  Vomiting and Nausea Only   Tramadol Other (See Comments) and Nausea Only   Ceftin [Cefuroxime Axetil] Diarrhea   Compazine  [Prochlorperazine ] Other (See Comments)    Vision problems   Ondansetron  Hcl    Other Other (See Comments)    Pneumonia vaccine (uncoded)   Tramadol Hcl Nausea And Vomiting   Zofran  [Ondansetron ] Other (See Comments)    Vision changes    Social History   Tobacco Use   Smoking status: Never   Smokeless tobacco: Never   Tobacco comments:    brief exposure through her husband  Vaping Use   Vaping status: Never Used  Substance Use Topics   Alcohol use: Never    Alcohol/week: 0.0 standard drinks of alcohol   Drug use: Never    Family History  Problem Relation Age of Onset   Colon cancer Mother 72   Alzheimer's disease Mother    Heart attack Father    Cancer Maternal Aunt        x2   Alzheimer's disease Maternal Aunt    Ovarian cancer Maternal Aunt    Prostate cancer Maternal Uncle    Kidney disease Maternal Uncle    Atrial fibrillation Maternal Uncle    Stroke Paternal Uncle    Aneurysm Paternal Grandmother        brain   Stroke Paternal Grandfather    Prostate cancer Cousin        paternal first cousin   Prostate cancer Cousin        paternal first cousin   Esophageal cancer Cousin        maternal first cousin   Breast cancer Cousin        DCIS, maternal first cousin   Ovarian cancer Cousin  maternal first cousin   Rheum arthritis Other    Lung disease Neg Hx     Objective:   Vitals:   12/25/23 1255  Weight: 210 lb (95.3 kg)   Body mass index is 37.2 kg/m.  Physical Exam Constitutional:      Appearance: Normal appearance.  HENT:     Head: Normocephalic and atraumatic.     Right Ear: Tympanic membrane normal.     Left Ear: Tympanic membrane normal.     Nose: Nose normal.     Mouth/Throat:     Mouth: Mucous membranes are moist.  Eyes:     Extraocular Movements: Extraocular movements intact.     Conjunctiva/sclera:  Conjunctivae normal.     Pupils: Pupils are equal, round, and reactive to light.  Cardiovascular:     Rate and Rhythm: Normal rate and regular rhythm.     Heart sounds: No murmur heard.    No friction rub. No gallop.  Pulmonary:     Effort: Pulmonary effort is normal.     Breath sounds: Normal breath sounds.  Abdominal:     General: Abdomen is flat.     Palpations: Abdomen is soft.  Musculoskeletal:        General: Normal range of motion.  Skin:    General: Skin is warm and dry.  Neurological:     General: No focal deficit present.     Mental Status: She is alert and oriented to person, place, and time.  Psychiatric:        Mood and Affect: Mood normal.     Lab Results: Lab Results  Component Value Date   WBC 4.9 12/21/2023   HGB 10.2 (L) 12/21/2023   HCT 31.5 (L) 12/21/2023   MCV 87.3 12/21/2023   PLT 256 12/21/2023    Lab Results  Component Value Date   CREATININE 1.11 (H) 12/21/2023   BUN 23 12/21/2023   NA 141 12/21/2023   K 4.2 12/21/2023   CL 106 12/21/2023   CO2 24 12/21/2023    Lab Results  Component Value Date   ALT 17 12/21/2023   AST 20 12/21/2023   ALKPHOS 88 12/21/2023   BILITOT 0.3 12/21/2023     Assessment & Plan:  #Hx of recurrent vulvar cellulitis in the setting of stage III vulvar cancer - Patient had undergone surgical intervention for vulvar cancer back in May 2025, she was given Bactrim  for surgical site infection.  Noted to have episode of recurrent cellulitis at that point she also had a hospitalization in the interim which was felt to be secondary cellulitis then developed shingles on the left thigh she was discharged on doxycycline and valacyclovir.   -Today on exam no concern for active infection. Denies fevers, chills. Discussed that complete resolution of erythema can take weeks.  -If concern for cellulitis returns then start linezolid x 1 week and inform ID/Gyn. -F/U with PRN  Loney Stank, MD [facility]   12/25/23  12:59 PM I have personally spent 65 minutes involved in face-to-face and non-face-to-face activities for this patient on the day of the visit. Professional time spent includes the following activities: Preparing to see the patient (review of tests), Obtaining and/or reviewing separately obtained history (admission/discharge record), Performing a medically appropriate examination and/or evaluation , Ordering medications/tests/procedures, referring and communicating with other health care professionals, Documenting clinical information in the EMR, Independently interpreting results (not separately reported), Communicating results to the patient/family/caregiver, Counseling and  educating the patient/family/caregiver and Care coordination (not separately reported).

## 2023-12-25 NOTE — Patient Instructions (Signed)
 Linezolid x1 week if concern for cellulitis. If concern for low pancytopenia then plan on dalvance infusion

## 2023-12-26 ENCOUNTER — Encounter: Payer: Self-pay | Admitting: Gynecologic Oncology

## 2023-12-28 ENCOUNTER — Inpatient Hospital Stay: Attending: Oncology | Admitting: Gynecologic Oncology

## 2023-12-28 VITALS — BP 135/74 | HR 56 | Temp 97.5°F | Resp 19 | Wt 205.0 lb

## 2023-12-28 DIAGNOSIS — Z9079 Acquired absence of other genital organ(s): Secondary | ICD-10-CM | POA: Insufficient documentation

## 2023-12-28 DIAGNOSIS — Z923 Personal history of irradiation: Secondary | ICD-10-CM | POA: Diagnosis not present

## 2023-12-28 DIAGNOSIS — E860 Dehydration: Secondary | ICD-10-CM | POA: Insufficient documentation

## 2023-12-28 DIAGNOSIS — C519 Malignant neoplasm of vulva, unspecified: Secondary | ICD-10-CM | POA: Insufficient documentation

## 2023-12-28 DIAGNOSIS — Z9221 Personal history of antineoplastic chemotherapy: Secondary | ICD-10-CM | POA: Diagnosis not present

## 2023-12-28 DIAGNOSIS — N762 Acute vulvitis: Secondary | ICD-10-CM | POA: Insufficient documentation

## 2023-12-28 NOTE — Progress Notes (Signed)
 Gynecologic Oncology Return Clinic Visit  12/28/23  Reason for Visit: follow-up  Treatment History: Oncology History  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)  06/08/2021 Initial Diagnosis   Breast cancer in female [facility])   07/23/2021 Genetic Testing   Negative hereditary cancer genetic testing: no pathogenic variants detected in [facility] BRCAPlus Panel. Report date is July 23, 2021.  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  The report date is Jul 26, 2021.  The BRCAplus panel offered by [employer] and includes sequencing and deletion/duplication analysis for the following 8 genes: ATM, BRCA1, BRCA2, CDH1, CHEK2, PALB2, PTEN, and TP53.  Results of pan-cancer panel pending.   The CancerNext-Expanded gene panel offered by [facility] and includes sequencing and rearrangement analysis for the following 77 genes: AIP, ALK, APC*, ATM*, AXIN2, BAP1, BARD1, BLM, BMPR1A, BRCA1*, BRCA2*, BRIP1*, CDC73, CDH1*, CDK4, CDKN1B, CDKN2A, CHEK2*, CTNNA1, DICER1, FANCC, FH, FLCN, GALNT12, KIF1B, LZTR1, MAX, MEN1, MET, MLH1*, MSH2*, MSH3, MSH6*, MUTYH*, NBN, NF1*, NF2, NTHL1, PALB2*, PHOX2B, PMS2*, POT1, PRKAR1A, PTCH1, PTEN*, RAD51C*, RAD51D*, RB1, RECQL, RET, SDHA, SDHAF2, SDHB, SDHC, SDHD, SMAD4, SMARCA4, SMARCB1, SMARCE1, STK11, SUFU, TMEM127, TP53*, TSC1, TSC2, VHL and XRCC2 (sequencing and deletion/duplication); EGFR, EGLN1, HOXB13, KIT, MITF, PDGFRA, POLD1, and POLE (sequencing only); EPCAM and GREM1 (deletion/duplication only). DNA and RNA analyses performed for * genes.    08/23/2021 Cancer Staging   Staging form: Breast, AJCC 8th Edition - Pathologic stage from 08/23/2021: Stage IA (pT2(2), pN0(sn), cM0, G1, ER+, PR+, HER2-) - Signed by Cornelius Wanda DEL, MD on 09/29/2021 Histopathologic type: Lobular carcinoma, NOS Stage prefix: Initial diagnosis Method of  lymph node assessment: Sentinel lymph node biopsy Nuclear grade: G1 Multigene prognostic tests performed: EndoPredict Histologic grading system: 3 grade system Residual tumor (R): R0 - None Laterality: Right Tumor size (mm): 24 Multiple tumors: Yes Number of tumors: 2 Lymph-vascular invasion (LVI): LVI not present (absent)/not identified Diagnostic confirmation: Positive histology PLUS positive immunophenotyping and/or positive genetic studies Specimen type: Excision Staged by: Managing physician Menopausal status: Postmenopausal Ki-67 (%): 5 Stage used in treatment planning: Yes National guidelines used in treatment planning: Yes Type of national guideline used in treatment planning: NCCN   Vulvar cancer (HCC)  06/28/2023 Initial Diagnosis   Vulvar cancer (HCC)   06/28/2023 Cancer Staging   Staging form: Vulva, AJCC V9 - Clinical stage from 06/28/2023: FIGO Stage IIIC (cT1b, cN1c, cM0) - Signed by Cornelius Wanda DEL, MD on 07/31/2023 Histopathologic type: Squamous cell carcinoma, NOS Stage prefix: Initial diagnosis Method of lymph node assessment: Lymph node dissection Histologic grade (G): G2 Histologic grading system: 3 grade system Tumor size (mm): 30 Lymph-vascular invasion (LVI): LVI present/identified, NOS Diagnostic confirmation: Positive histology Specimen type: Excision Staged by: Managing physician Femoral-inguinal nodal status: Positive Solitary (s) or multifocal (m) tumors in the primary site: Solitary Perineural invasion (PNI): Unknown Stage used in treatment planning: Yes National guidelines used in treatment planning: Yes Type of national guideline used in treatment planning: NCCN   09/04/2023 - 09/18/2023 Chemotherapy   Patient is on Treatment Plan : CERVICAL Pembrolizumab  q21d + XRT (cisplatin  d/c'd 09/25/23)     12/22/2023 - 12/22/2023 Chemotherapy   Patient is on Treatment Plan : CERVICAL Pembrolizumab  (200) q21d      The patient endorsed having occasional  abnormal pap smears prior to her hysterectomy in 2011 for fibroids. She denied ever having cervical biopsies or procedures done, thinks that Pap  smears were just repeated. After her hysterectomy, she had a period of time where she did not get follow-up. Since establishing with her OB/GYN provider, she has been getting Pap smears which were initially normal. Her more recent Pap history is noted below.    09/21/20: ASCUS pap 09/27/2021: ASC-H pap 11/03/21: Vaginoscopy. 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 Biopsy shows squamous mucosa with mild koilocytic atypia c/w LSIL lesion. Vaginal biopsy 10/2022: HPV effect with mild dysplasia (VAIN1). Started using vaginal estrogen   Seen on 05/11/23. Reports new area within the vagina, some pruritus and pain. 1 cm area of ulceration noted along the left mid vulva with tenderness on palpation. Biopsy recommended - she preferred to come back when she had a driver.  Biopsy performed on 05/25/23, revealed grade 2 SCC, DOI at least 1.25 mm.    06/05/23: PET shows hypermetabolic left inguinal lymph nodes measuring up to 1.8 cm. No other hypermetabolism noted.    On 06/28/2023, she underwent partial modified radical left vulvectomy, bilateral inguinal lymphadenectomy with Dr. Comer Dollar. Her post-operative course was overall non-complicated. She stayed in the hospital until POD 2 with improved mobility at that time. Final path returned with: A. LYMPH NODE, LEFT INGUINAL, RESECTION:  Metastatic carcinoma in three of four lymph nodes (3/4).  Largest metastasis is 3 cm.  Extensive extranodal tumor.   B. LYMPH NODE, RIGHT INGUINAL, RESECTION:  Two lymph nodes negative for metastatic carcinoma (0/2).   C. VULVA, LEFT STITCH AT 12, VULVECTOMY:  Invasive squamous cell carcinoma associated with squamous cell  carcinoma in situ, 3 cm.  Greatest depth of invasion is 5 mm.  Carcinoma in situ focally involves the 6:00 margin.  Invasive carcinoma focally less than 1 mm from 6:00 margin.  Lymphovascular space involvement by carcinoma.  See oncology table and comment.   D. LATERAL DEEP MARGIN:  Benign adipose tissue and connective tissue.  Negative for carcinoma.  Final lateral deep margin negative for carcinoma.   Per Dr. Cornelius, Medical Oncologist note from 12/21/23: She was receiving concurrent chemoradiation with weekly cisplatin , as well as pembrolizuab every 3 weeks since her PDL 1 score is 85%, followed by maintenance pembrolizumab . She had difficulty tolerating treatment due to nausea, vomiting and diarrhea, as well as hypomagnesemia. We gave her additional IV fluids weekly and IV magnesium  as needed. She developed mild pancytopenia felt to be most likely due to treatment. After her third cycle of weekly cisplatin , she was hospitalized with sepsis due to recurrent cellulitis of the left vulva and thigh. She then developed shingles of the left thigh. She had pancytopenia at least in part due to chemotherapy. At discharge on July 4, WBCs were 2.5, hemoglobin 8.7 and platelets 142,000. She was discharged on doxycycline and valacyclovir. Due to the multiple issues with chemotherapy and radiation, she has declined taking any further therapy. She had about half of her treatment. I offered her continuation of immunotherapy but she declines that as well.   She has been following with Infectious Disease for recurrent vulvar cellulitis. The patient was advised to start taking linezolid for one week and notify her ID team if cellulitis returned per Dr. Dennise.  Interval History: Patient presents today to the office for follow-up.  She finished chemotherapy at the end of June.  She does note that her hair is becoming thinner.  She has had  difficulty with concentration since this time.  She would like to get back  into her Bible studies but has difficulty with concentration.  She recently saw ID and was told to use an antibiotic for flareups.  She states she fell 3 weeks ago and then 1 hour later developed a fever and started feeling poorly.  She ended up receiving 3 days of IV [facility] at the [facility].    She is tolerating her diet with no nausea or emesis.  No abdominal pain reported.  Vulvar soreness intermittently.  She has mild bladder leakage that was not there prior to surgery.  She has recently been using Diflucan  for itching and states this is helping her symptoms.  She has been having bowel movements.  She was using a stool softener but states she was having loose stools from the frequent antibiotics.  She does feel like the anal sphincter is tighter than previously.  No lower extremity edema at this time but did have edema when going through the cellulitis flareup.  She has had scant rectal bleeding after bowel movement that has been a minimal amount.  No vaginal bleeding or abnormal discharge.  Mild dysuria related to urine hitting the vulva.  Past Medical/Surgical History: Past Medical History:  Diagnosis Date   Anemia    Anxiety    Cancer (HCC)    right breast ILC   Complication of anesthesia    difficulty waking up   Depression    Diverticulitis    Family history of breast cancer    Family history of ovarian cancer    Family history of prostate cancer    Fibromyalgia    GERD (gastroesophageal reflux disease)    Heart murmur    History of colonic polyps    History of hiatal hernia    Hyperlipidemia    Hypertension    IBS (irritable bowel syndrome)    Ischemic colitis 03/2012   Osteoporosis    Pre-diabetes    RLS (restless legs syndrome)    Sinusitis    Sleep apnea    CPAP nightly    Past Surgical History:  Procedure Laterality Date   BREAST LUMPECTOMY WITH RADIOACTIVE SEED LOCALIZATION Right 08/04/2021   Procedure: RIGHT BREAST LUMPECTOMY WITH RADIOACTIVE SEED  LOCALIZATION;  Surgeon: Vanderbilt Ned, MD;  Location: [facility];  Service: General;  Laterality: Right;   CHOLECYSTECTOMY  2004   COLONOSCOPY WITH PROPOFOL  N/A 04/18/2017   Procedure: COLONOSCOPY WITH PROPOFOL ;  Surgeon: Kristie Lamprey, MD;  Location: [facility];  Service: Endoscopy;  Laterality: N/A;   ESOPHAGOGASTRODUODENOSCOPY (EGD) WITH PROPOFOL  N/A 04/18/2017   Procedure: ESOPHAGOGASTRODUODENOSCOPY (EGD) WITH PROPOFOL ;  Surgeon: Kristie Lamprey, MD;  Location: [facility];  Service: Endoscopy;  Laterality: N/A;   EXAM UNDER ANESTHESIA, PELVIC N/A 06/28/2023   Procedure: EXAM UNDER ANESTHESIA, PELVIC;  Surgeon: Viktoria Comer SAUNDERS, MD;  Location: [facility];  Service: Gynecology;  Laterality: N/A;   FLEXIBLE SIGMOIDOSCOPY  04/20/2012   Procedure: FLEXIBLE SIGMOIDOSCOPY;  Surgeon: Belvie JONETTA Just, MD;  Location: [facility];  Service: Endoscopy;  Laterality: N/A;   INGUINAL LYMPHADENECTOMY N/A 06/28/2023   Procedure: LYMPHADENECTOMY, INGUINAL, OPEN;  Surgeon: Viktoria Comer SAUNDERS, MD;  Location: [facility];  Service: Gynecology;  Laterality: N/A;   IR IMAGING GUIDED PORT INSERTION  07/31/2023   NISSEN FUNDOPLICATION  2011   PARAESOPHAGEAL HERNIA REPAIR  09/11/2009   and Nissen fundoplication   RADICAL VULVECTOMY N/A 06/28/2023   Procedure: VULVECTOMY, RADICAL;  Surgeon: Viktoria Comer SAUNDERS, MD;  Location: [facility];  Service: Gynecology;  Laterality: N/A;  Possible skin flap   RE-EXCISION OF BREAST LUMPECTOMY Right 08/18/2021   Procedure: RE-EXCISION RIGHT BREAST LUMPECTOMY;  Surgeon: Vanderbilt Ned, MD;  Location: [facility];  Service: General;  Laterality: Right;   SENTINEL NODE BIOPSY N/A 08/04/2021   Procedure: SENTINEL NODE BIOPSY;  Surgeon: Vanderbilt Ned, MD;  Location: [facility];  Service: General;  Laterality: N/A;   TONSILLECTOMY  1960's   TOTAL ABDOMINAL HYSTERECTOMY  2001   w/ BSO    Family History  Problem Relation Age of Onset   Colon cancer Mother 37   Alzheimer's disease Mother     Heart attack Father    Cancer Maternal Aunt        x2   Alzheimer's disease Maternal Aunt    Ovarian cancer Maternal Aunt    Prostate cancer Maternal Uncle    Kidney disease Maternal Uncle    Atrial fibrillation Maternal Uncle    Stroke Paternal Uncle    Aneurysm Paternal Grandmother        brain   Stroke Paternal Grandfather    Prostate cancer Cousin        paternal first cousin   Prostate cancer Cousin        paternal first cousin   Esophageal cancer Cousin        maternal first cousin   Breast cancer Cousin        DCIS, maternal first cousin   Ovarian cancer Cousin        maternal first cousin   Rheum arthritis Other    Lung disease Neg Hx     Social History   Socioeconomic History   Marital status: Widowed    Spouse name: Lynwood   Number of children: 1   Years of education: Not on file   Highest education level: Some college, no degree  Occupational History   Occupation: disabled  Tobacco Use   Smoking status: Never   Smokeless tobacco: Never   Tobacco comments:    brief exposure through her husband  Vaping Use   Vaping status: Never Used  Substance and Sexual Activity   Alcohol use: Never    Alcohol/week: 0.0 standard drinks of alcohol   Drug use: Never   Sexual activity: Not Currently  Other Topics Concern   Not on file  Social History Narrative   From [place] originally. Previously lived in [place]. Previously worked at [employer] also in a [employer]. Has also worked as a Diplomatic Services operational officer for [employer]. No pets currently. No bird exposure. No known mold in her current home.    Lives with husband   Caffeine- coffee 2 c daily   Social Drivers of Health   Financial Resource Strain: Low Risk  (02/01/2022)   Overall Financial Resource Strain (CARDIA)    Difficulty of Paying Living Expenses: Not hard at all  Food Insecurity: No Food Insecurity (06/28/2023)   Hunger Vital Sign    Worried About Running Out of Food in the Last Year: Never true    Ran  Out of Food in the Last Year: Never true  Transportation Needs: No Transportation Needs (06/28/2023)   PRAPARE - Administrator, Civil Service (Medical): No    Lack of Transportation (Non-Medical): No  Physical Activity: Not on file  Stress: Not on file  Social Connections: Moderately Isolated (06/28/2023)   Social Connection and Isolation Panel    Frequency of Communication with Friends and Family: More than three  times a week    Frequency of Social Gatherings with Friends and Family: Twice a week    Attends Religious Services: More than 4 times per year    Active Member of [employer] or Organizations: No    Attends Banker Meetings: Never    Marital Status: Widowed    Current Medications:  Current Outpatient Medications:    acetaminophen  (TYLENOL ) 650 MG CR tablet, 1,300 mg in the morning and at bedtime., Disp: , Rfl:    amLODipine  (NORVASC ) 5 MG tablet, Take 5 mg by mouth daily., Disp: , Rfl:    Ascorbic Acid (VITAMIN C) 500 MG CHEW, Chew 500 mg by mouth daily., Disp: , Rfl:    aspirin EC 81 MG tablet, Take 81 mg by mouth at bedtime. Swallow whole., Disp: , Rfl:    atenolol  (TENORMIN ) 50 MG tablet, Take 75 mg by mouth daily. , Disp: , Rfl:    benazepril  (LOTENSIN ) 10 MG tablet, Take 10 mg by mouth daily., Disp: , Rfl:    benzonatate (TESSALON) 100 MG capsule, Take 100-200 mg by mouth at bedtime., Disp: , Rfl:    Biotin 1000 MCG tablet, Take 1,000 mcg by mouth daily., Disp: , Rfl:    Calcium  Citrate-Vitamin D (CITRACAL + D PO), Take 1 tablet by mouth daily., Disp: , Rfl:    cetirizine (ZYRTEC) 10 MG tablet, Take 10 mg by mouth at bedtime., Disp: , Rfl:    chlorpheniramine-HYDROcodone (TUSSIONEX) 10-8 MG/5ML, Take 5 mLs by mouth every 12 (twelve) hours as needed for cough., Disp: 90 mL, Rfl: 0   Cholecalciferol (VITAMIN D) 50 MCG (2000 UT) tablet, Take 2,000 Units by mouth daily., Disp: , Rfl:    cyanocobalamin  1000 MCG tablet, Take 1,000 mcg by mouth daily., Disp: ,  Rfl:    diclofenac sodium (VOLTAREN) 1 % GEL, Apply 1 application  topically daily as needed (for pain)., Disp: , Rfl:    dicyclomine (BENTYL) 10 MG capsule, Take 1 capsule by mouth as needed. (Patient not taking: Reported on 12/25/2023), Disp: , Rfl:    diphenhydrAMINE  (BENADRYL ) 25 MG tablet, Take 25 mg by mouth every 6 (six) hours as needed for allergies., Disp: , Rfl:    diphenoxylate-atropine (LOMOTIL) 2.5-0.025 MG tablet, Take 2 tablets by mouth 4 (four) times daily as needed for diarrhea or loose stools., Disp: , Rfl:    DULoxetine  (CYMBALTA ) 60 MG capsule, 2 capsules Orally once a day, Disp: , Rfl:    EPINEPHrine  (EPIPEN  2-PAK) 0.3 mg/0.3 mL IJ SOAJ injection, Inject 0.3 mg into the muscle as needed for anaphylaxis., Disp: , Rfl:    famotidine  (PEPCID ) 20 MG tablet, Take 20 mg by mouth at bedtime., Disp: , Rfl:    fexofenadine (ALLEGRA) 180 MG tablet, Take 180 mg by mouth in the morning., Disp: , Rfl:    fluconazole  (DIFLUCAN ) 200 MG tablet, Take 1 tablet (200 mg total) by mouth daily for 10 days., Disp: 10 tablet, Rfl: 0   fluticasone  (FLONASE ) 50 MCG/ACT nasal spray, Place 1 spray into both nostrils daily., Disp: , Rfl:    lidocaine  (XYLOCAINE ) 5 % ointment, Apply 1 Application topically 2 (two) times daily as needed for moderate pain (pain score 4-6) (to the vulva). Apply to vulva as needed for discomfort, Disp: 35.44 g, Rfl: 3   lidocaine  4 %, Place 1 patch onto the skin daily., Disp: , Rfl:    linezolid (ZYVOX) 600 MG tablet, Take 1 tablet (600 mg total) by mouth 2 (two) times daily for  7 days., Disp: 14 tablet, Rfl: 0   linezolid (ZYVOX) 600 MG tablet, Take 1 tablet (600 mg total) by mouth 2 (two) times daily., Disp: 14 tablet, Rfl: 0   loratadine  (CLARITIN ) 10 MG tablet, 1 tablet Orally Once a day as needed; Duration: 30 day(s) (Patient not taking: Reported on 12/25/2023), Disp: , Rfl:    LORazepam  (ATIVAN ) 1 MG tablet, Take 1 tablet (1 mg total) by mouth every 8 (eight) hours. (Patient  not taking: Reported on 12/25/2023), Disp: 30 tablet, Rfl: 0   NON FORMULARY, Pt uses a c-pap nightly, Disp: , Rfl:    Nutritional Supplements (JUICE PLUS FIBRE PO), Take 2 each by mouth daily. Fruits and Vegetables, Disp: , Rfl:    nystatin  (MYCOSTATIN ) 100000 UNIT/ML suspension, Take 5 mLs (500,000 Units total) by mouth 4 (four) times daily. (Patient not taking: Reported on 12/25/2023), Disp: 60 mL, Rfl: 2   nystatin  (MYCOSTATIN /NYSTOP ) powder, Apply 1 Application topically 3 (three) times daily., Disp: 30 g, Rfl: 5   Omega-3 Fatty Acids (FISH OIL) 1000 MG CAPS, Take 1,000 mg by mouth daily. , Disp: , Rfl:    oxyCODONE -acetaminophen  (PERCOCET/ROXICET) 5-325 MG tablet, Take 1-2 tablets by mouth every 4 (four) hours as needed for severe pain (pain score 7-10). (Patient not taking: Reported on 12/25/2023), Disp: 90 tablet, Rfl: 0   pantoprazole  (PROTONIX ) 40 MG tablet, Take 40 mg by mouth every evening., Disp: , Rfl:    Phenylephrine -APAP-Guaifenesin (TYLENOL  SINUS SEVERE PO), Take 2 tablets by mouth daily as needed (drainage)., Disp: , Rfl:    polyvinyl alcohol (LIQUIFILM TEARS) 1.4 % ophthalmic solution, Place 1 drop into both eyes 5 (five) times daily., Disp: , Rfl:    Probiotic Product (PROBIOTIC & ACIDOPHILUS EX ST PO), Take 1 capsule by mouth daily. Ultra Flora Plus Capsules, Disp: , Rfl:    Propylene Glycol (SYSTANE COMPLETE) 0.6 % SOLN, Place 1 drop into both eyes in the morning and at bedtime., Disp: , Rfl:    rOPINIRole  (REQUIP ) 0.5 MG tablet, Take 0.5 mg by mouth at bedtime., Disp: , Rfl:    senna-docusate (SENOKOT-S) 8.6-50 MG tablet, Take 2 tablets by mouth at bedtime. For AFTER surgery, do not take if having diarrhea, Disp: 30 tablet, Rfl: 0   simvastatin  (ZOCOR ) 10 MG tablet, Take 10 mg by mouth at bedtime.  , Disp: , Rfl:    sucralfate  (CARAFATE ) 1 GM/10ML suspension, Take 1 g by mouth 2 (two) times daily., Disp: , Rfl:    traZODone  (DESYREL ) 50 MG tablet, Take 50 mg by mouth at bedtime.,  Disp: , Rfl:    triamcinolone  (KENALOG ) 0.025 % ointment, Apply 1 Application topically 2 (two) times daily., Disp: 30 g, Rfl: 0   vitamin E 400 UNIT capsule, Take 400 Units by mouth daily.  , Disp: , Rfl:    White Petrolatum -Mineral Oil (REFRESH P.M. OP), Place 1 Application into both eyes at bedtime., Disp: , Rfl:   Review of Systems: See interval. Additional review is negative.  +decreased concentration, + anxiety, +itching  Physical Exam: BP 135/74 (BP Location: Left Arm, Patient Position: Sitting)   Pulse (!) 56   Temp (!) 97.5 F (36.4 C) (Oral)   Resp 19   Wt 205 lb (93 kg)   SpO2 100%   BMI 36.31 kg/m  General: Alert, oriented, no acute distress. HEENT: Sclera anicteric. Chest: Unlabored breathing on room air. Lungs clear. Heart regular in rate and rhythm.  Abdomen: Obese, soft, nontender.   Skin: Well-healed groin incisions.  GU: Erythema of the mons with induration and increased warmth measuring around 5 cm. Borders marked. Significant improvement of lower vulvar edema from last assessment. No erythema or concerning findings for vulvar cancer recurrence on vulvar exam today. Dr. Viktoria examined the vulva as well today. No lower extrem edema. Healing abrasion on right shin.  Laboratory & Radiologic Studies:    Latest Ref Rng & Units 12/21/2023   11:00 AM 12/06/2023    9:27 AM 11/28/2023   10:35 AM  CBC  WBC 4.0 - 10.5 K/uL 4.9  9.7  4.5   Hemoglobin 12.0 - 15.0 g/dL 89.7  89.7  89.2   Hematocrit 36.0 - 46.0 % 31.5  29.7  32.7   Platelets 150 - 400 K/uL 256  135  178     Assessment & Plan: Doris Reyes is a 73 y.o. woman with Stage IIIC SCC of the vulva who presents for follow-up. She completed half of her adjuvant treatment including chemotherapy and radiation due to multiple medical issues. No evidence of recurrence on today's examination. Due to mons erythema/induration/increased warmth on today's exam, the patient has been advised to reach out to her ID team and  to start taking the prn antibiotic they recommended.    Reportable signs and symptoms reviewed. We will plan on seeing her in the office for follow up in three months or sooner if needed.  20 minutes of total time was spent for this patient encounter, including preparation, face-to-face counseling with the patient and coordination of care, and documentation of the encounter.  Eleanor Epps NP [facility] Oncology

## 2023-12-28 NOTE — Patient Instructions (Addendum)
 It was good seeing you today.   Dr. Viktoria is recommending reaching out to Infectious Disease about the redness, tightness of skin, and increased warmth in the middle of the upper aspect of your groin. She is recommending getting back on antibiotic therapy for this flare up.  We will plan on seeing you in three months or sooner if needed. Please call for any vulvar changes or symptoms listed below.  Symptoms to report to your health care team include vaginal bleeding, rectal bleeding, bloating, weight loss without effort, new and persistent pain, new and  persistent fatigue, new leg swelling, new masses (i.e., bumps in your neck or groin), new and persistent cough, new and persistent nausea and vomiting, change in bowel or bladder habits, and any other concerns.

## 2023-12-29 ENCOUNTER — Telehealth: Payer: Self-pay

## 2023-12-29 NOTE — Telephone Encounter (Signed)
 Patient called stating she seen Dr. Arloa yesterday and her legs were red and swelling and he recommended she go ahead and start the Linezolid for cellulitis. Patient wanted to inform you.  Dagmawi Venable ONEIDA Ligas, CMA

## 2023-12-31 ENCOUNTER — Encounter: Payer: Self-pay | Admitting: Oncology

## 2024-01-04 ENCOUNTER — Encounter: Payer: Self-pay | Admitting: Hematology and Oncology

## 2024-01-04 ENCOUNTER — Inpatient Hospital Stay: Admitting: Hematology and Oncology

## 2024-01-04 ENCOUNTER — Other Ambulatory Visit

## 2024-01-04 ENCOUNTER — Inpatient Hospital Stay

## 2024-01-04 ENCOUNTER — Ambulatory Visit (INDEPENDENT_AMBULATORY_CARE_PROVIDER_SITE_OTHER)
Admission: RE | Admit: 2024-01-04 | Discharge: 2024-01-04 | Disposition: A | Discharge status: Home / Self Care | Source: Ambulatory Visit | Attending: Hematology and Oncology | Admitting: Hematology and Oncology

## 2024-01-04 ENCOUNTER — Other Ambulatory Visit: Payer: Self-pay

## 2024-01-04 VITALS — BP 126/81 | HR 54 | Temp 98.7°F | Resp 16 | Ht 63.0 in | Wt 208.7 lb

## 2024-01-04 DIAGNOSIS — Z9221 Personal history of antineoplastic chemotherapy: Secondary | ICD-10-CM | POA: Diagnosis not present

## 2024-01-04 DIAGNOSIS — C519 Malignant neoplasm of vulva, unspecified: Secondary | ICD-10-CM

## 2024-01-04 DIAGNOSIS — E860 Dehydration: Secondary | ICD-10-CM

## 2024-01-04 DIAGNOSIS — M79621 Pain in right upper arm: Secondary | ICD-10-CM

## 2024-01-04 DIAGNOSIS — Z923 Personal history of irradiation: Secondary | ICD-10-CM | POA: Diagnosis not present

## 2024-01-04 DIAGNOSIS — Z9079 Acquired absence of other genital organ(s): Secondary | ICD-10-CM | POA: Diagnosis not present

## 2024-01-04 DIAGNOSIS — N762 Acute vulvitis: Secondary | ICD-10-CM | POA: Diagnosis not present

## 2024-01-04 LAB — CMP (CANCER CENTER ONLY)
ALT: 15 U/L (ref 0–44)
AST: 19 U/L (ref 15–41)
Albumin: 4.1 g/dL (ref 3.5–5.0)
Alkaline Phosphatase: 75 U/L (ref 38–126)
Anion gap: 9 (ref 5–15)
BUN: 25 mg/dL — ABNORMAL HIGH (ref 8–23)
CO2: 25 mmol/L (ref 22–32)
Calcium: 9.3 mg/dL (ref 8.9–10.3)
Chloride: 105 mmol/L (ref 98–111)
Creatinine: 1.13 mg/dL — ABNORMAL HIGH (ref 0.44–1.00)
GFR, Estimated: 51 mL/min — ABNORMAL LOW (ref >60)
Glucose, Bld: 109 mg/dL — ABNORMAL HIGH (ref 70–99)
Potassium: 4.5 mmol/L (ref 3.5–5.1)
Sodium: 140 mmol/L (ref 135–145)
Total Bilirubin: 0.3 mg/dL (ref 0.0–1.2)
Total Protein: 6.3 g/dL — ABNORMAL LOW (ref 6.5–8.1)

## 2024-01-04 LAB — CBC WITH DIFFERENTIAL (CANCER CENTER ONLY)
Abs Immature Granulocytes: 0.01 K/uL (ref 0.00–0.07)
Basophils Absolute: 0 K/uL (ref 0.0–0.1)
Basophils Relative: 1 %
Eosinophils Absolute: 0.4 K/uL (ref 0.0–0.5)
Eosinophils Relative: 10 %
HCT: 32.3 % — ABNORMAL LOW (ref 36.0–46.0)
Hemoglobin: 10.5 g/dL — ABNORMAL LOW (ref 12.0–15.0)
Immature Granulocytes: 0 %
Lymphocytes Relative: 18 %
Lymphs Abs: 0.7 K/uL (ref 0.7–4.0)
MCH: 28.1 pg (ref 26.0–34.0)
MCHC: 32.5 g/dL (ref 30.0–36.0)
MCV: 86.4 fL (ref 80.0–100.0)
Monocytes Absolute: 0.5 K/uL (ref 0.1–1.0)
Monocytes Relative: 13 %
Neutro Abs: 2.3 K/uL (ref 1.7–7.7)
Neutrophils Relative %: 58 %
Platelet Count: 187 K/uL (ref 150–400)
RBC: 3.74 MIL/uL — ABNORMAL LOW (ref 3.87–5.11)
RDW: 12.7 % (ref 11.5–15.5)
WBC Count: 4 K/uL (ref 4.0–10.5)
nRBC: 0 % (ref 0.0–0.2)

## 2024-01-04 LAB — MAGNESIUM: Magnesium: 2.2 mg/dL (ref 1.7–2.4)

## 2024-01-04 MED ORDER — SODIUM CHLORIDE 0.9 % IV SOLN: Freq: Once | INTRAVENOUS | Status: AC

## 2024-01-04 NOTE — Progress Notes (Signed)
 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  She was treated with pelvic vulvectomy, bilateral debulking, radical lymphadenectomy, and inguinal dissection in April 2025.  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  There was lymphovascular invasion and carcinoma in situ at the 6 o'clock margin with < 1 mm margin for invasive carcinoma. PET scan revealed metastatic left inguinal lymph nodes up to 1.8 cm in diameter, but no additional evidence of metastatic disease in the neck, chest, abdomen or pelvis. There was a 4 mm right lower lobe nodule, too small for PET resolution.   She was receiving concurrent chemoradiation with weekly cisplatin , as well as pembrolizuab every 3 weeks since her PDL 1 score is 85%, followed by maintenance pembrolizumab .  She had difficulty tolerating treatment due to nausea, vomiting and diarrhea, as well as hypomagnesemia.  We gave her additional IV fluids weekly and IV magnesium  as needed.  She developed mild pancytopenia felt to be most likely due to treatment.  After her third cycle of weekly cisplatin , she was hospitalized with sepsis due to recurrent cellulitis of the left vulva and thigh.  She then developed shingles of the left thigh.  She had pancytopenia at least in part due to chemotherapy.  At discharge on July 4, WBCs were 2.5, hemoglobin 8.7 and platelets 142,000.  She had hypokalemia and hypomagnesemia resolved with IV Supple Tatian.  She was discharged on doxycycline and valacyclovir.  Due to the multiple issues due to chemotherapy, we recommended stopping  cisplatin  and transitioning to maintenance pembrolizumab  once radiation is complete.  The patient has declined any further radiation or immunotherapy despite us  urging her to continue single agent immunotherapy.  She has had episodes of recurrent cellulitis of the lower abdomen, so was referred to infectious disease and saw Dr. Dennise.  She gave her Zyvox to use if she had another flare of the cellulitis.  The patient states she saw Dr. Viktoria, who was concerned about increased swelling in that area, so has been taking Zyvox this week and will complete that today.  She has persistent anemia, which is stable.  Due to persistent pain in her right upper arm after a fall, right humerus x-ray was done and was negative.  She is clinically dehydrated, I will give her 1 L of normal saline today.  We will plan to see her back in 4 weeks with a CBC, comprehensive metabolic panel and magnesium .    The patient understands the plans discussed today and is in agreement with them.  She knows to contact our office if she develops concerns prior to her next appointment.   I provided 30 minutes of face-to-face time during this encounter and > 50% was spent counseling as documented under my assessment and plan.    Elicia Lui A Gaberiel Youngblood, PA-C  [facility] - A DEPT OF [facility]. [facility] [address] [place] [address] Dept: [phone] Dept Fax: [phone]   Orders Placed This Encounter  Procedures   DG Humerus Right    Standing Status:   Future  Number of Occurrences:   1    Expected Date:   01/04/2024    Expiration Date:   01/03/2025    Reason for Exam (SYMPTOM  OR DIAGNOSIS REQUIRED):   right upper arm pain after fall    Preferred imaging location?:   [facility]      CHIEF COMPLAINT:  CC: Stage IIIC vulvar cancer  Current Treatment: Observation  HISTORY OF PRESENT ILLNESS:   Oncology History  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)   06/08/2021 Initial Diagnosis   Breast cancer in female [facility])   07/23/2021 Genetic Testing   Negative hereditary cancer genetic testing: no pathogenic variants detected in [facility] BRCAPlus Panel. Report date is July 23, 2021.  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  The report date is Jul 26, 2021.  The BRCAplus panel offered by [employer] and includes sequencing and deletion/duplication analysis for the following 8 genes: ATM, BRCA1, BRCA2, CDH1, CHEK2, PALB2, PTEN, and TP53.  Results of pan-cancer panel pending.   The CancerNext-Expanded gene panel offered by [facility] and includes sequencing and rearrangement analysis for the following 77 genes: AIP, ALK, APC*, ATM*, AXIN2, BAP1, BARD1, BLM, BMPR1A, BRCA1*, BRCA2*, BRIP1*, CDC73, CDH1*, CDK4, CDKN1B, CDKN2A, CHEK2*, CTNNA1, DICER1, FANCC, FH, FLCN, GALNT12, KIF1B, LZTR1, MAX, MEN1, MET, MLH1*, MSH2*, MSH3, MSH6*, MUTYH*, NBN, NF1*, NF2, NTHL1, PALB2*, PHOX2B, PMS2*, POT1, PRKAR1A, PTCH1, PTEN*, RAD51C*, RAD51D*, RB1, RECQL, RET, SDHA, SDHAF2, SDHB, SDHC, SDHD, SMAD4, SMARCA4, SMARCB1, SMARCE1, STK11, SUFU, TMEM127, TP53*, TSC1, TSC2, VHL and XRCC2 (sequencing and deletion/duplication); EGFR, EGLN1, HOXB13, KIT, MITF, PDGFRA, POLD1, and POLE (sequencing only); EPCAM and GREM1 (deletion/duplication only). DNA and RNA analyses performed for * genes.    08/23/2021 Cancer Staging   Staging form: Breast, AJCC 8th Edition - Pathologic stage from 08/23/2021: Stage IA (pT2(2), pN0(sn), cM0, G1, ER+, PR+, HER2-) - Signed by Cornelius Wanda [place], MD on 09/29/2021 Histopathologic type: Lobular carcinoma, NOS Stage prefix: Initial diagnosis Method of lymph node assessment: Sentinel lymph node biopsy Nuclear grade: G1 Multigene prognostic tests performed: EndoPredict Histologic grading system: 3 grade system Residual  tumor (R): R0 - None Laterality: Right Tumor size (mm): 24 Multiple tumors: Yes Number of tumors: 2 Lymph-vascular invasion (LVI): LVI not present (absent)/not identified Diagnostic confirmation: Positive histology PLUS positive immunophenotyping and/or positive genetic studies Specimen type: Excision Staged by: Managing physician Menopausal status: Postmenopausal Ki-67 (%): 5 Stage used in treatment planning: Yes National guidelines used in treatment planning: Yes Type of national guideline used in treatment planning: NCCN   Vulvar cancer (HCC)  06/28/2023 Initial Diagnosis   Vulvar cancer (HCC)   06/28/2023 Cancer Staging   Staging form: Vulva, AJCC V9 - Clinical stage from 06/28/2023: FIGO Stage IIIC (cT1b, cN1c, cM0) - Signed by Cornelius Wanda [place], MD on 07/31/2023 Histopathologic type: Squamous cell carcinoma, NOS Stage prefix: Initial diagnosis Method of lymph node assessment: Lymph node dissection Histologic grade (G): G2 Histologic grading system: 3 grade system Tumor size (mm): 30 Lymph-vascular invasion (LVI): LVI present/identified, NOS Diagnostic confirmation: Positive histology Specimen type: Excision Staged by: Managing physician Femoral-inguinal nodal status: Positive Solitary (s) or multifocal (m) tumors in the primary site: Solitary Perineural invasion (PNI): Unknown Stage used in treatment planning: Yes National guidelines used in treatment planning: Yes Type of national guideline used in treatment planning: NCCN   09/04/2023 - 09/18/2023 Chemotherapy   Patient is on Treatment Plan : CERVICAL  Pembrolizumab  q21d + XRT (cisplatin  d/c'd 09/25/23)     12/22/2023 - 12/22/2023 Chemotherapy   Patient is on Treatment Plan : CERVICAL Pembrolizumab  (200) q21d         INTERVAL HISTORY:   Doris Reyes is here today for repeat clinical assessment. She states she has been fatigued and generally not feeling well this week.  She attributes to this to being on Zyvox. She completes  that today. She was seen by infectious disease, who only recommended taking the antibiotic if the cellulitis flared up. She then saw Dr. Viktoria who recommended taking the Zyvox. She states Dr. Viktoria is going to be seeing her every 3 months.  She states she is drinking plenty of fluids, but feels she needs fluids today. She denies lightheadedness or dizziness with standing.  She denies progressive fatigue concerning for worsening anemia.  She denies any overt form of blood loss.  She reports right upper arm pain after a fall about 1 month ago. She has osteoporosis for which she is only taking calcium  and vitamin D due to concern of side effects associated with treatment.  She denies fevers or chills. She denies pain. Her appetite is good. Her weight has increased 3 pounds over last week.  REVIEW OF SYSTEMS:   Review of Systems  Constitutional:  Positive for fatigue. Negative for appetite change, chills, diaphoresis, fever and unexpected weight change.  HENT:   Negative for lump/mass, mouth sores, sore throat and trouble swallowing.   Respiratory:  Negative for cough, hemoptysis and shortness of breath.   Cardiovascular:  Negative for chest pain and leg swelling.  Gastrointestinal:  Negative for abdominal pain, blood in stool, constipation, diarrhea, nausea and vomiting.  Genitourinary:  Negative for difficulty urinating, dysuria, frequency, hematuria and vaginal bleeding.   Musculoskeletal:  Negative for arthralgias, back pain, gait problem and myalgias.  Skin:  Negative for rash.  Neurological:  Negative for dizziness, extremity weakness, gait problem, headaches and light-headedness.  Hematological:  Negative for adenopathy. Does not bruise/bleed easily.  Psychiatric/Behavioral:  Negative for depression and sleep disturbance. The patient is not nervous/anxious.      VITALS:   Blood pressure 126/81, pulse (!) 54, temperature 98.7 F (37.1 C), temperature source Oral, resp. rate 16, height 5' 3  (1.6 m), weight 208 lb 11.2 oz (94.7 kg), SpO2 97%.  Wt Readings from Last 3 Encounters:  01/04/24 208 lb 11.2 oz (94.7 kg)  12/28/23 205 lb (93 kg)  12/25/23 210 lb (95.3 kg)    Body mass index is 36.97 kg/m.  Performance status (ECOG): 1 - Symptomatic but completely ambulatory    PHYSICAL EXAM:   Physical Exam Vitals and nursing note reviewed.  Constitutional:      General: She is not in acute distress.    Appearance: Normal appearance. She is not ill-appearing.  HENT:     Head: Normocephalic and atraumatic.     Mouth/Throat:     Mouth: Mucous membranes are moist.     Pharynx: Oropharynx is clear. No oropharyngeal exudate or posterior oropharyngeal erythema.  Eyes:     General: No scleral icterus.    Extraocular Movements: Extraocular movements intact.     Conjunctiva/sclera: Conjunctivae normal.     Pupils: Pupils are equal, round, and reactive to light.  Cardiovascular:     Rate and Rhythm: Normal rate and regular rhythm.     Heart sounds: Normal heart sounds. No murmur heard.    No friction rub. No gallop.  Pulmonary:  Effort: Pulmonary effort is normal.     Breath sounds: Normal breath sounds. No wheezing, rhonchi or rales.  Abdominal:     General: There is no distension.     Palpations: Abdomen is soft. There is no hepatomegaly, splenomegaly or mass.     Tenderness: There is no abdominal tenderness.     Comments: She has barely any residual erythema of the lower abdominal wall.  Musculoskeletal:        General: Normal range of motion.     Cervical back: Normal range of motion and neck supple. No tenderness.     Right lower leg: No edema.     Left lower leg: No edema.  Lymphadenopathy:     Cervical: No cervical adenopathy.     Upper Body:     Right upper body: No supraclavicular or axillary adenopathy.     Left upper body: No supraclavicular or axillary adenopathy.     Lower Body: No right inguinal adenopathy. No left inguinal adenopathy.  Skin:     General: Skin is warm and dry.     Coloration: Skin is not jaundiced.     Findings: No rash.  Neurological:     Mental Status: She is alert and oriented to person, place, and time.     Cranial Nerves: No cranial nerve deficit.  Psychiatric:        Mood and Affect: Mood normal.        Behavior: Behavior normal.        Thought Content: Thought content normal.     LABS:      Latest Ref Rng & Units 01/04/2024   10:13 AM 12/21/2023   11:00 AM 12/06/2023    9:27 AM  CBC  WBC 4.0 - 10.5 K/uL 4.0  4.9  9.7   Hemoglobin 12.0 - 15.0 g/dL 89.4  89.7  89.7   Hematocrit 36.0 - 46.0 % 32.3  31.5  29.7   Platelets 150 - 400 K/uL 187  256  135       Latest Ref Rng & Units 01/04/2024   10:13 AM 12/21/2023   11:00 AM 12/06/2023    9:27 AM  CMP  Glucose 70 - 99 mg/dL 890  98  887   BUN 8 - 23 mg/dL 25  23  18    Creatinine 0.44 - 1.00 mg/dL 8.86  8.88  8.81   Sodium 135 - 145 mmol/L 140  141  135   Potassium 3.5 - 5.1 mmol/L 4.5  4.2  3.5   Chloride 98 - 111 mmol/L 105  106  100   CO2 22 - 32 mmol/L 25  24  22    Calcium  8.9 - 10.3 mg/dL 9.3  9.6  9.2   Total Protein 6.5 - 8.1 g/dL 6.3  6.7  6.2   Total Bilirubin 0.0 - 1.2 mg/dL 0.3  0.3  0.5   Alkaline Phos 38 - 126 U/L 75  88  73   AST 15 - 41 U/L 19  20  19    ALT 0 - 44 U/L 15  17  19      Lab Results  Component Value Date   TIBC 441 10/11/2023   FERRITIN 65 10/11/2023   IRONPCTSAT 14 10/11/2023    STUDIES:   DG Humerus Right Result Date: 01/04/2024 CLINICAL DATA:  right upper arm pain after fall EXAM: RIGHT HUMERUS - 2+ VIEW COMPARISON:  CT CAP, 09/21/2023. FINDINGS: There is no evidence of fracture or other focal bone lesions.  RIGHT axillary surgical clip. Soft tissues are otherwise unremarkable. IMPRESSION: No acute displaced fracture, dislocation or significant degenerative change within the RIGHT humerus. Electronically Signed   By: Thom Hall M.D.   On: 01/04/2024 11:58   CT ABDOMEN PELVIS W CONTRAST Result Date:  12/07/2023 CLINICAL DATA:  Vulvar cancer and new cellulitis. EXAM: CT ABDOMEN AND PELVIS WITH CONTRAST TECHNIQUE: Multidetector CT imaging of the abdomen and pelvis was performed using the standard protocol following bolus administration of intravenous contrast. RADIATION DOSE REDUCTION: This exam was performed according to the departmental dose-optimization program which includes automated exposure control, adjustment of the mA and/or kV according to patient size and/or use of iterative reconstruction technique. CONTRAST:  OMNIPAQUE  IOHEXOL  300 MG/ML  SOLN COMPARISON:  CT chest abdomen pelvis dated 09/21/2023. FINDINGS: Lower chest: The visualized lung bases are clear. No intra-abdominal free air or free fluid. Hepatobiliary: The liver is unremarkable. No biliary dilatation. Cholecystectomy. Pancreas: Unremarkable. No pancreatic ductal dilatation or surrounding inflammatory changes. Spleen: Normal in size without focal abnormality. Adrenals/Urinary Tract: The right adrenal glands unremarkable. There is a 1 cm indeterminate left adrenal nodule. There is no hydronephrosis on either side. From all there is symmetric enhancement and excretion of contrast by both kidneys. The visualized ureters and urinary bladder appear unremarkable. Stomach/Bowel: There is a small hiatal hernia. There is sigmoid diverticulosis. There is no bowel obstruction or active inflammation. The appendix is normal. Vascular/Lymphatic: Mild aortoiliac atherosclerotic disease. The IVC is unremarkable. No portal venous gas. There is no adenopathy. Reproductive: Hysterectomy.  No suspicious adnexal masses. Other: Bilateral inguinal surgical clips. There is thickening of the skin and subcutaneous stranding over the mons pubis and medial proximal left thigh which may represent cellulitis. No drainable fluid collection/abscess. No soft tissue gas. Musculoskeletal: Degenerative changes of the spine. No acute osseous pathology. IMPRESSION: 1. No  acute intra-abdominal or pelvic pathology. 2. Sigmoid diverticulosis. No bowel obstruction. Normal appendix. 3. Thickening of the skin and subcutaneous stranding over the mons pubis and medial proximal left thigh scratch may represent cellulitis. No drainable fluid collection/abscess. No soft tissue gas. 4.  Aortic Atherosclerosis (ICD10-I70.0). Electronically Signed   By: Vanetta Chou M.D.   On: 12/07/2023 13:48      HISTORY:   Past Medical History:  Diagnosis Date   Anemia    Anxiety    Cancer (HCC)    right breast ILC   Complication of anesthesia    difficulty waking up   Depression    Diverticulitis    Family history of breast cancer    Family history of ovarian cancer    Family history of prostate cancer    Fibromyalgia    GERD (gastroesophageal reflux disease)    Heart murmur    History of colonic polyps    History of hiatal hernia    Hyperlipidemia    Hypertension    IBS (irritable bowel syndrome)    Ischemic colitis 03/2012   Osteoporosis    Pre-diabetes    RLS (restless legs syndrome)    Sinusitis    Sleep apnea    CPAP nightly    Past Surgical History:  Procedure Laterality Date   BREAST LUMPECTOMY WITH RADIOACTIVE SEED LOCALIZATION Right 08/04/2021   Procedure: RIGHT BREAST LUMPECTOMY WITH RADIOACTIVE SEED LOCALIZATION;  Surgeon: Vanderbilt Ned, MD;  Location: [facility];  Service: General;  Laterality: Right;   CHOLECYSTECTOMY  2004   COLONOSCOPY WITH PROPOFOL  N/A 04/18/2017   Procedure: COLONOSCOPY WITH PROPOFOL ;  Surgeon: Kristie Lamprey, MD;  Location: [facility];  Service: Endoscopy;  Laterality: N/A;   ESOPHAGOGASTRODUODENOSCOPY (EGD) WITH PROPOFOL  N/A 04/18/2017   Procedure: ESOPHAGOGASTRODUODENOSCOPY (EGD) WITH PROPOFOL ;  Surgeon: Kristie Lamprey, MD;  Location: [facility];  Service: Endoscopy;  Laterality: N/A;   EXAM UNDER ANESTHESIA, PELVIC N/A 06/28/2023   Procedure: EXAM UNDER ANESTHESIA, PELVIC;  Surgeon: Viktoria Comer SAUNDERS, MD;  Location: [facility];   Service: Gynecology;  Laterality: N/A;   FLEXIBLE SIGMOIDOSCOPY  04/20/2012   Procedure: FLEXIBLE SIGMOIDOSCOPY;  Surgeon: Belvie JONETTA Just, MD;  Location: [facility];  Service: Endoscopy;  Laterality: N/A;   INGUINAL LYMPHADENECTOMY N/A 06/28/2023   Procedure: LYMPHADENECTOMY, INGUINAL, OPEN;  Surgeon: Viktoria Comer SAUNDERS, MD;  Location: [facility];  Service: Gynecology;  Laterality: N/A;   IR IMAGING GUIDED PORT INSERTION  07/31/2023   NISSEN FUNDOPLICATION  2011   PARAESOPHAGEAL HERNIA REPAIR  09/11/2009   and Nissen fundoplication   RADICAL VULVECTOMY N/A 06/28/2023   Procedure: VULVECTOMY, RADICAL;  Surgeon: Viktoria Comer SAUNDERS, MD;  Location: [facility];  Service: Gynecology;  Laterality: N/A;  Possible skin flap   RE-EXCISION OF BREAST LUMPECTOMY Right 08/18/2021   Procedure: RE-EXCISION RIGHT BREAST LUMPECTOMY;  Surgeon: Vanderbilt Ned, MD;  Location: [facility];  Service: General;  Laterality: Right;   SENTINEL NODE BIOPSY N/A 08/04/2021   Procedure: SENTINEL NODE BIOPSY;  Surgeon: Vanderbilt Ned, MD;  Location: [facility];  Service: General;  Laterality: N/A;   TONSILLECTOMY  1960's   TOTAL ABDOMINAL HYSTERECTOMY  2001   w/ BSO    Family History  Problem Relation Age of Onset   Colon cancer Mother 44   Alzheimer's disease Mother    Heart attack Father    Cancer Maternal Aunt        x2   Alzheimer's disease Maternal Aunt    Ovarian cancer Maternal Aunt    Prostate cancer Maternal Uncle    Kidney disease Maternal Uncle    Atrial fibrillation Maternal Uncle    Stroke Paternal Uncle    Aneurysm Paternal Grandmother        brain   Stroke Paternal Grandfather    Prostate cancer Cousin        paternal first cousin   Prostate cancer Cousin        paternal first cousin   Esophageal cancer Cousin        maternal first cousin   Breast cancer Cousin        DCIS, maternal first cousin   Ovarian cancer Cousin        maternal first cousin   Rheum arthritis Other    Lung  disease Neg Hx     Social History:  reports that she has never smoked. She has never used smokeless tobacco. She reports that she does not drink alcohol and does not use drugs.The patient is alone today.  Allergies:  Allergies  Allergen Reactions   Cefuroxime Other (See Comments), Diarrhea and Nausea Only    IBS   Sulfa  Antibiotics Nausea And Vomiting and Nausea Only   Tramadol Other (See Comments) and Nausea Only   Ceftin [Cefuroxime Axetil] Diarrhea   Compazine  [Prochlorperazine ] Other (See Comments)    Vision problems   Ondansetron  Hcl    Other Other (See Comments)    Pneumonia vaccine (uncoded)   Tramadol Hcl Nausea And Vomiting   Zofran  [Ondansetron ] Other (See Comments)    Vision changes    Current Medications: Current Outpatient Medications  Medication Sig Dispense Refill   acetaminophen  (TYLENOL ) 650 MG  CR tablet 1,300 mg in the morning and at bedtime.     amLODipine  (NORVASC ) 5 MG tablet Take 5 mg by mouth daily.     Ascorbic Acid (VITAMIN C) 500 MG CHEW Chew 500 mg by mouth daily.     aspirin EC 81 MG tablet Take 81 mg by mouth at bedtime. Swallow whole.     atenolol  (TENORMIN ) 50 MG tablet Take 75 mg by mouth daily.      benazepril  (LOTENSIN ) 10 MG tablet Take 10 mg by mouth daily.     benzonatate (TESSALON) 100 MG capsule Take 100-200 mg by mouth at bedtime.     Biotin 1000 MCG tablet Take 1,000 mcg by mouth daily.     Calcium  Citrate-Vitamin D (CITRACAL + D PO) Take 1 tablet by mouth daily.     cetirizine (ZYRTEC) 10 MG tablet Take 10 mg by mouth at bedtime.     chlorpheniramine-HYDROcodone (TUSSIONEX) 10-8 MG/5ML Take 5 mLs by mouth every 12 (twelve) hours as needed for cough. 90 mL 0   Cholecalciferol (VITAMIN D) 50 MCG (2000 UT) tablet Take 2,000 Units by mouth daily.     cyanocobalamin  1000 MCG tablet Take 1,000 mcg by mouth daily.     diclofenac sodium (VOLTAREN) 1 % GEL Apply 1 application  topically daily as needed (for pain).     diphenhydrAMINE  (BENADRYL )  25 MG tablet Take 25 mg by mouth every 6 (six) hours as needed for allergies.     diphenoxylate-atropine (LOMOTIL) 2.5-0.025 MG tablet Take 2 tablets by mouth 4 (four) times daily as needed for diarrhea or loose stools.     DULoxetine  (CYMBALTA ) 60 MG capsule 2 capsules Orally once a day     EPINEPHrine  (EPIPEN  2-PAK) 0.3 mg/0.3 mL IJ SOAJ injection Inject 0.3 mg into the muscle as needed for anaphylaxis.     famotidine  (PEPCID ) 20 MG tablet Take 20 mg by mouth at bedtime.     fexofenadine (ALLEGRA) 180 MG tablet Take 180 mg by mouth in the morning.     fluticasone  (FLONASE ) 50 MCG/ACT nasal spray Place 1 spray into both nostrils daily.     lidocaine  (XYLOCAINE ) 5 % ointment Apply 1 Application topically 2 (two) times daily as needed for moderate pain (pain score 4-6) (to the vulva). Apply to vulva as needed for discomfort 35.44 g 3   lidocaine  4 % Place 1 patch onto the skin daily.     linezolid (ZYVOX) 600 MG tablet Take 1 tablet (600 mg total) by mouth 2 (two) times daily. 14 tablet 0   NON FORMULARY Pt uses a c-pap nightly     Nutritional Supplements (JUICE PLUS FIBRE PO) Take 2 each by mouth daily. Fruits and Vegetables     nystatin  (MYCOSTATIN /NYSTOP ) powder Apply 1 Application topically 3 (three) times daily. 30 g 5   Omega-3 Fatty Acids (FISH OIL) 1000 MG CAPS Take 1,000 mg by mouth daily.      pantoprazole  (PROTONIX ) 40 MG tablet Take 40 mg by mouth every evening.     Phenylephrine -APAP-Guaifenesin (TYLENOL  SINUS SEVERE PO) Take 2 tablets by mouth daily as needed (drainage).     polyvinyl alcohol (LIQUIFILM TEARS) 1.4 % ophthalmic solution Place 1 drop into both eyes 5 (five) times daily.     Probiotic Product (PROBIOTIC & ACIDOPHILUS EX ST PO) Take 1 capsule by mouth daily. Ultra Flora Plus Capsules     Propylene Glycol (SYSTANE COMPLETE) 0.6 % SOLN Place 1 drop into both eyes in the morning and  at bedtime.     rOPINIRole  (REQUIP ) 0.5 MG tablet Take 0.5 mg by mouth at bedtime.      senna-docusate (SENOKOT-S) 8.6-50 MG tablet Take 2 tablets by mouth at bedtime. For AFTER surgery, do not take if having diarrhea 30 tablet 0   simvastatin  (ZOCOR ) 10 MG tablet Take 10 mg by mouth at bedtime.       sucralfate  (CARAFATE ) 1 GM/10ML suspension Take 1 g by mouth 2 (two) times daily.     traZODone  (DESYREL ) 50 MG tablet Take 50 mg by mouth at bedtime.     triamcinolone  (KENALOG ) 0.025 % ointment Apply 1 Application topically 2 (two) times daily. 30 g 0   vitamin E 400 UNIT capsule Take 400 Units by mouth daily.       White Petrolatum -Mineral Oil (REFRESH P.M. OP) Place 1 Application into both eyes at bedtime.     dicyclomine (BENTYL) 10 MG capsule Take 1 capsule by mouth as needed. (Patient not taking: Reported on 01/04/2024)     LORazepam  (ATIVAN ) 1 MG tablet Take 1 tablet (1 mg total) by mouth every 8 (eight) hours. (Patient not taking: Reported on 01/04/2024) 30 tablet 0   No current facility-administered medications for this visit.

## 2024-01-04 NOTE — Patient Instructions (Signed)
 Dehydration, Adult Dehydration is a condition in which there is not enough water or other fluids in the body. This happens when a person loses more fluids than they take in. Important organs cannot work right without the right amount of fluids. Any loss of fluids from the body can cause dehydration. Dehydration can be mild, worse, or very bad. It should be treated right away to keep it from getting very bad. What are the causes? Conditions that cause loss of water in the body. They include: Watery poop (diarrhea). Vomiting. Sweating a lot. Fever. Infection. Peeing (urinating) a lot. Not drinking enough fluids. Certain medicines, such as medicines that take extra fluid out of the body (diuretics). Lack of safe drinking water. Not being able to get enough water and food. What increases the risk? Having a long-term (chronic) illness that has not been treated the right way, such as: Diabetes. Heart disease. Kidney disease. Being 25 years of age or older. Having a disability. Living in a place that is high above the ground or sea (high in altitude). The thinner, drier air causes more fluid loss. Doing exercises that put stress on your body for a long time. Being active when in hot places. What are the signs or symptoms? Symptoms of dehydration depend on how bad it is. Mild or worse dehydration Thirst. Dry lips or dry mouth. Feeling dizzy or light-headed. Muscle cramps. Passing little pee or dark pee. Pee may be the color of tea. Headache. Very bad dehydration Changes in skin. Skin may: Be cold to the touch (clammy). Be blotchy or pale. Not go back to normal right after you pinch it and let it go. Little or no tears, pee, or sweat. Fast breathing. Low blood pressure. Weak pulse. Pulse that is more than 100 beats a minute when you are sitting still. Other changes, such as: Feeling very thirsty. Eyes that look hollow (sunken). Cold hands and feet. Being confused. Being very  tired (lethargic) or having trouble waking from sleep. Losing weight. Loss of consciousness. How is this treated? Treatment for this condition depends on how bad your dehydration is. Treatment should start right away. Do not wait until your condition gets very bad. Very bad dehydration is an emergency. You will need to go to a hospital. Mild or worse dehydration can be treated at home. You may be asked to: Drink more fluids. Drink an oral rehydration solution (ORS). This drink gives you the right amount of fluids, salts, and minerals (electrolytes). Very bad dehydration can be treated: With fluids through an IV tube. By correcting low levels of electrolytes in the body. By treating the problem that caused your dehydration. Follow these instructions at home: Oral rehydration solution If told by your doctor, drink an ORS: Make an ORS. Use instructions on the package. Start by drinking small amounts, about  cup (120 mL) every 5-10 minutes. Slowly drink more until you have had the amount that your doctor said to have.  Eating and drinking  Drink enough clear fluid to keep your pee pale yellow. If you were told to drink an ORS, finish the ORS first. Then, start slowly drinking other clear fluids. Drink fluids such as: Water. Do not drink only water. Doing that can make the salt (sodium) level in your body get too low. Water from ice chips you suck on. Fruit juice that you have added water to (diluted). Low-calorie sports drinks. Eat foods that have the right amounts of salts and minerals, such as bananas, oranges, potatoes,  tomatoes, or spinach. Do not drink alcohol. Avoid drinks that have caffeine or sugar. These include:: High-calorie sports drinks. Fruit juice that you did not add water to. Soda. Coffee or energy drinks. Avoid foods that are greasy or have a lot of fat or sugar. General instructions Take over-the-counter and prescription medicines only as told by your doctor. Do  not take sodium tablets. Doing that can make the salt level in your body get too high. Return to your normal activities as told by your doctor. Ask your doctor what activities are safe for you. Keep all follow-up visits. Your doctor may check and change your treatment. Contact a doctor if: You have pain in your belly (abdomen) and the pain: Gets worse. Stays in one place. You have a rash. You have a stiff neck. You get angry or annoyed more easily than normal. You are more tired or have a harder time waking than normal. You feel weak or dizzy. You feel very thirsty. Get help right away if: You have any symptoms of very bad dehydration. You vomit every time you eat or drink. Your vomiting gets worse, does not go away, or you vomit blood or green stuff. You are getting treatment, but symptoms are getting worse. You have a fever. You have a very bad headache. You have: Diarrhea that gets worse or does not go away. Blood in your poop (stool). This may cause poop to look black and tarry. No pee in 6-8 hours. Only a small amount of pee in 6-8 hours, and the pee is very dark. You have trouble breathing. These symptoms may be an emergency. Get help right away. Call 911. Do not wait to see if the symptoms will go away. Do not drive yourself to the hospital. This information is not intended to replace advice given to you by your health care provider. Make sure you discuss any questions you have with your health care provider. Document Revised: 10/11/2021 Document Reviewed: 10/11/2021 Elsevier Patient Education  2024 [employer].

## 2024-01-04 NOTE — Assessment & Plan Note (Signed)
 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  She was treated with pelvic vulvectomy, bilateral debulking, radical lymphadenectomy, and inguinal dissection in April 2025.  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  There was lymphovascular invasion and carcinoma in situ at the 6 o'clock margin with < 1 mm margin for invasive carcinoma. PET scan revealed metastatic left inguinal lymph nodes up to 1.8 cm in diameter, but no additional evidence of metastatic disease in the neck, chest, abdomen or pelvis. There was a 4 mm right lower lobe nodule, too small for PET resolution.   She was receiving concurrent chemoradiation with weekly cisplatin , as well as pembrolizuab every 3 weeks since her PDL 1 score is 85%, followed by maintenance pembrolizumab .  She had difficulty tolerating treatment due to nausea, vomiting and diarrhea, as well as hypomagnesemia.  We gave her additional IV fluids weekly and IV magnesium  as needed.  She developed mild pancytopenia felt to be most likely due to treatment.  After her third cycle of weekly cisplatin , she was hospitalized with sepsis due to recurrent cellulitis of the left vulva and thigh.  She then developed shingles of the left thigh.  She had pancytopenia at least in part due to chemotherapy.  At discharge on July 4, WBCs were 2.5, hemoglobin 8.7 and platelets 142,000.  She had hypokalemia and hypomagnesemia resolved with IV Supple Tatian.  She was discharged on doxycycline and valacyclovir.  Due to the multiple issues due to chemotherapy, we recommended stopping cisplatin  and transitioning to maintenance pembrolizumab  once radiation is complete.  The patient has declined any further radiation or immunotherapy despite us  urging her to continue single agent immunotherapy.  She has had episodes of  recurrent cellulitis of the lower abdomen, so was referred to infectious disease and saw Dr. Dennise.  She gave her Zyvox to use if she had another flare of the cellulitis.  The patient states she saw Dr. Viktoria, who was concerned about increased swelling in that area, so has been taking Zyvox this week and will complete that today.  She has persistent anemia, which is stable.  Due to persistent pain in her right upper arm after a fall, right humerus x-ray was done and was negative.  She is clinically dehydrated, I will give her 1 L of normal saline today.  We will plan to see her back in 4 weeks with a CBC, comprehensive metabolic panel and magnesium .

## 2024-01-08 ENCOUNTER — Encounter: Payer: Self-pay | Admitting: Hematology and Oncology

## 2024-01-17 ENCOUNTER — Ambulatory Visit: Admitting: Internal Medicine

## 2024-01-23 ENCOUNTER — Ambulatory Visit: Admitting: Internal Medicine

## 2024-01-25 ENCOUNTER — Other Ambulatory Visit (HOSPITAL_BASED_OUTPATIENT_CLINIC_OR_DEPARTMENT_OTHER): Payer: Self-pay

## 2024-01-25 ENCOUNTER — Other Ambulatory Visit: Payer: Self-pay | Admitting: Oncology

## 2024-01-25 ENCOUNTER — Telehealth: Payer: Self-pay

## 2024-01-25 DIAGNOSIS — C519 Malignant neoplasm of vulva, unspecified: Secondary | ICD-10-CM

## 2024-01-25 MED ORDER — FLUCONAZOLE 200 MG PO TABS
200.0000 mg | ORAL_TABLET | Freq: Every day | ORAL | 1 refills | Status: DC
  Filled 2024-01-25: qty 10, 10d supply, fill #0
  Filled 2024-02-23: qty 10, 10d supply, fill #1

## 2024-01-25 NOTE — Telephone Encounter (Signed)
 Pt called to report I think I have another yeast infection. I have itching, and discharge like before. I think the cellulitis is better.  She also has burning when urine hits skin. Afebrile. The discharge is whitish, yellow colored. Would like sent to [facility] pharmacy next door.

## 2024-01-26 ENCOUNTER — Other Ambulatory Visit (HOSPITAL_BASED_OUTPATIENT_CLINIC_OR_DEPARTMENT_OTHER): Payer: Self-pay

## 2024-01-29 ENCOUNTER — Telehealth: Payer: Self-pay

## 2024-01-29 ENCOUNTER — Encounter: Payer: Self-pay | Admitting: Internal Medicine

## 2024-01-29 ENCOUNTER — Ambulatory Visit: Admitting: Internal Medicine

## 2024-01-29 ENCOUNTER — Other Ambulatory Visit (HOSPITAL_BASED_OUTPATIENT_CLINIC_OR_DEPARTMENT_OTHER): Payer: Self-pay

## 2024-01-29 ENCOUNTER — Other Ambulatory Visit: Payer: Self-pay

## 2024-01-29 VITALS — BP 138/75 | HR 70 | Temp 96.5°F | Ht 63.0 in | Wt 211.0 lb

## 2024-01-29 DIAGNOSIS — C519 Malignant neoplasm of vulva, unspecified: Secondary | ICD-10-CM | POA: Diagnosis not present

## 2024-01-29 DIAGNOSIS — L03315 Cellulitis of perineum: Secondary | ICD-10-CM

## 2024-01-29 DIAGNOSIS — N9089 Other specified noninflammatory disorders of vulva and perineum: Secondary | ICD-10-CM

## 2024-01-29 MED ORDER — LINEZOLID 600 MG PO TABS
600.0000 mg | ORAL_TABLET | Freq: Two times a day (BID) | ORAL | 0 refills | Status: DC
  Filled 2024-01-29: qty 14, 7d supply, fill #0

## 2024-01-29 NOTE — Telephone Encounter (Signed)
 Pt called c/o redness on inner thigh up towards groin area. Is concerned she is having flare of cellulitis. Scheduled appt for follow up today at 415. Lorenda CHRISTELLA Code, RMA

## 2024-01-29 NOTE — Progress Notes (Signed)
 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   Genetic testing 07/26/2021   Family history of breast cancer 07/14/2021   Family history of ovarian cancer 07/14/2021   Family history of prostate cancer 07/14/2021   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) 06/08/2021    Class: Diagnosis of   GERD (gastroesophageal reflux disease) 12/11/2014   Fibromyalgia 12/11/2014   Allergic rhinitis 12/11/2014   Cough 12/11/2014   Generalized anxiety disorder 12/11/2014   Hyperlipidemia 12/11/2014   Vitamin D deficiency 12/11/2014   Esophageal spasm 12/11/2014   IBS (irritable bowel syndrome) 12/11/2014   Essential hypertension 12/11/2014   History of colonic polyps 12/11/2014   OSA (obstructive sleep apnea) 12/11/2014   Vomiting 10/06/2010     Subjective:  Doris Reyes is a 73 y.o.  female eith history of vulvar cancer stage IIIc, osteopenia, referred by hematology/oncology for recurrent vulvar cellulitis.  In August 2024 patient was found to have abnormal Pap smear, biopsy testing done revealed stage III squamous cell..  She had a pelvic of valvectomy, bilateral debulking, radical lymphadenectomy, inguinal dissection on/2/25.  Pathology revealed 3 out of 4 positive nodes, extensive extranodal tumor.  Moderate differential c squamous cell carcinoma.  She was given Bactrim  as well for  surgical site infection.  She had some nausea/vomiting with it as such did not take the pills consistently.  She is followed by [facility] as well.  She was seen on 4/25 noted to have continue sitz bath's.  She was given a course of amoxicillin  then received Rocephin .  Last seen on 9/11 with oncology.  It was noted that patient has been hospitalized for cellulitis of left bovine thigh she developed significant shingles of the left thigh as well.  Discharged on Doxy and valacyclovir.  Blood cultures were taken that showed no growth.  Today: She notes she had new erythema that started this weekend,no fevers or chills Review of Systems  All other systems reviewed and are negative.   Past Medical History:  Diagnosis Date   Anemia    Anxiety    Cancer (HCC)    right breast ILC   Complication of anesthesia    difficulty waking up   Depression    Diverticulitis    Family history of breast cancer    Family history of ovarian cancer    Family history of prostate cancer    Fibromyalgia    GERD (gastroesophageal reflux disease)    Heart murmur    History of colonic polyps    History of hiatal hernia    Hyperlipidemia    Hypertension    IBS (irritable bowel syndrome)    Ischemic colitis 03/2012   Osteoporosis    Pre-diabetes    RLS (restless legs syndrome)    Sinusitis    Sleep apnea    CPAP nightly  Outpatient Medications Prior to Visit  Medication Sig Dispense Refill   acetaminophen  (TYLENOL ) 650 MG CR tablet 1,300 mg in the morning and at bedtime.     amLODipine  (NORVASC ) 5 MG tablet Take 5 mg by mouth daily.     Ascorbic Acid (VITAMIN C) 500 MG CHEW Chew 500 mg by mouth daily.     aspirin EC 81 MG tablet Take 81 mg by mouth at bedtime. Swallow whole.     atenolol  (TENORMIN ) 50 MG tablet Take 75 mg by mouth daily.      benazepril  (LOTENSIN ) 10 MG tablet Take 10 mg by mouth daily.     benzonatate (TESSALON) 100 MG capsule Take 100-200 mg by mouth at bedtime.     Biotin 1000 MCG  tablet Take 1,000 mcg by mouth daily.     Calcium  Citrate-Vitamin D (CITRACAL + D PO) Take 1 tablet by mouth daily.     cetirizine (ZYRTEC) 10 MG tablet Take 10 mg by mouth at bedtime.     chlorpheniramine-HYDROcodone (TUSSIONEX) 10-8 MG/5ML Take 5 mLs by mouth every 12 (twelve) hours as needed for cough. 90 mL 0   Cholecalciferol (VITAMIN D) 50 MCG (2000 UT) tablet Take 2,000 Units by mouth daily.     cyanocobalamin  1000 MCG tablet Take 1,000 mcg by mouth daily.     diclofenac sodium (VOLTAREN) 1 % GEL Apply 1 application  topically daily as needed (for pain).     diphenhydrAMINE  (BENADRYL ) 25 MG tablet Take 25 mg by mouth every 6 (six) hours as needed for allergies.     diphenoxylate-atropine (LOMOTIL) 2.5-0.025 MG tablet Take 2 tablets by mouth 4 (four) times daily as needed for diarrhea or loose stools.     DULoxetine  (CYMBALTA ) 60 MG capsule 2 capsules Orally once a day     EPINEPHrine  (EPIPEN  2-PAK) 0.3 mg/0.3 mL IJ SOAJ injection Inject 0.3 mg into the muscle as needed for anaphylaxis.     famotidine  (PEPCID ) 20 MG tablet Take 20 mg by mouth at bedtime.     fexofenadine (ALLEGRA) 180 MG tablet Take 180 mg by mouth in the morning.     fluconazole  (DIFLUCAN ) 200 MG tablet Take 1 tablet (200 mg total) by mouth daily. 10 tablet 1   fluticasone  (FLONASE ) 50 MCG/ACT nasal spray Place 1 spray into both nostrils daily.     lidocaine  (XYLOCAINE ) 5 % ointment Apply 1 Application topically 2 (two) times daily as needed for moderate pain (pain score 4-6) (to the vulva). Apply to vulva as needed for discomfort 35.44 g 3   lidocaine  4 % Place 1 patch onto the skin daily.     linezolid (ZYVOX) 600 MG tablet Take 1 tablet (600 mg total) by mouth 2 (two) times daily. 14 tablet 0   NON FORMULARY Pt uses a c-pap nightly     Nutritional Supplements (JUICE PLUS FIBRE PO) Take 2 each by mouth daily. Fruits and Vegetables     nystatin  (MYCOSTATIN /NYSTOP ) powder Apply 1 Application topically 3 (three) times  daily. 30 g 5   Omega-3 Fatty Acids (FISH OIL) 1000 MG CAPS Take 1,000 mg by mouth daily.      pantoprazole  (PROTONIX ) 40 MG tablet Take 40 mg by mouth every evening.     Phenylephrine -APAP-Guaifenesin (TYLENOL  SINUS SEVERE PO) Take 2 tablets by mouth daily as needed (drainage).     polyvinyl alcohol (LIQUIFILM TEARS) 1.4 % ophthalmic solution Place 1 drop into both eyes 5 (five) times daily.     Probiotic Product (PROBIOTIC &  ACIDOPHILUS EX ST PO) Take 1 capsule by mouth daily. Ultra Flora Plus Capsules     Propylene Glycol (SYSTANE COMPLETE) 0.6 % SOLN Place 1 drop into both eyes in the morning and at bedtime.     rOPINIRole  (REQUIP ) 0.5 MG tablet Take 0.5 mg by mouth at bedtime.     senna-docusate (SENOKOT-S) 8.6-50 MG tablet Take 2 tablets by mouth at bedtime. For AFTER surgery, do not take if having diarrhea 30 tablet 0   simvastatin  (ZOCOR ) 10 MG tablet Take 10 mg by mouth at bedtime.       sucralfate  (CARAFATE ) 1 GM/10ML suspension Take 1 g by mouth 2 (two) times daily.     traZODone  (DESYREL ) 50 MG tablet Take 50 mg by mouth at bedtime.     triamcinolone  (KENALOG ) 0.025 % ointment Apply 1 Application topically 2 (two) times daily. 30 g 0   vitamin E 400 UNIT capsule Take 400 Units by mouth daily.       White Petrolatum -Mineral Oil (REFRESH P.M. OP) Place 1 Application into both eyes at bedtime.     No facility-administered medications prior to visit.     Allergies  Allergen Reactions   Cefuroxime Other (See Comments), Diarrhea and Nausea Only    IBS   Sulfa  Antibiotics Nausea And Vomiting and Nausea Only   Tramadol Other (See Comments) and Nausea Only   Ceftin [Cefuroxime Axetil] Diarrhea   Compazine  [Prochlorperazine ] Other (See Comments)    Vision problems   Ondansetron  Hcl    Other Other (See Comments)    Pneumonia vaccine (uncoded)   Tramadol Hcl Nausea And Vomiting   Zofran  [Ondansetron ] Other (See Comments)    Vision changes    Social History   Tobacco Use    Smoking status: Never   Smokeless tobacco: Never   Tobacco comments:    brief exposure through her husband  Vaping Use   Vaping status: Never Used  Substance Use Topics   Alcohol use: Never    Alcohol/week: 0.0 standard drinks of alcohol   Drug use: Never    Family History  Problem Relation Age of Onset   Colon cancer Mother 46   Alzheimer's disease Mother    Heart attack Father    Cancer Maternal Aunt        x2   Alzheimer's disease Maternal Aunt    Ovarian cancer Maternal Aunt    Prostate cancer Maternal Uncle    Kidney disease Maternal Uncle    Atrial fibrillation Maternal Uncle    Stroke Paternal Uncle    Aneurysm Paternal Grandmother        brain   Stroke Paternal Grandfather    Prostate cancer Cousin        paternal first cousin   Prostate cancer Cousin        paternal first cousin   Esophageal cancer Cousin        maternal first cousin   Breast cancer Cousin        DCIS, maternal first cousin   Ovarian cancer Cousin        maternal first cousin   Rheum arthritis Other    Lung disease Neg Hx     Objective:  There were no vitals filed for this visit. There is no height or weight on file to calculate BMI.  Physical Exam Constitutional:      Appearance: Normal appearance.  HENT:     Head: Normocephalic and atraumatic.     Right Ear: Tympanic membrane normal.  Left Ear: Tympanic membrane normal.     Nose: Nose normal.     Mouth/Throat:     Mouth: Mucous membranes are moist.  Eyes:     Extraocular Movements: Extraocular movements intact.     Conjunctiva/sclera: Conjunctivae normal.     Pupils: Pupils are equal, round, and reactive to light.  Cardiovascular:     Rate and Rhythm: Normal rate and regular rhythm.     Heart sounds: No murmur heard.    No friction rub. No gallop.  Pulmonary:     Effort: Pulmonary effort is normal.     Breath sounds: Normal breath sounds.  Abdominal:     General: Abdomen is flat.     Palpations: Abdomen is soft.   Skin:    General: Skin is warm and dry.  Neurological:     General: No focal deficit present.     Mental Status: She is alert and oriented to person, place, and time.  Psychiatric:        Mood and Affect: Mood normal.   Chaperone presents: supra-pubic erythema at belt line, no pubic erythema noted  Lab Results: Lab Results  Component Value Date   WBC 4.0 01/04/2024   HGB 10.5 (L) 01/04/2024   HCT 32.3 (L) 01/04/2024   MCV 86.4 01/04/2024   PLT 187 01/04/2024    Lab Results  Component Value Date   CREATININE 1.13 (H) 01/04/2024   BUN 25 (H) 01/04/2024   NA 140 01/04/2024   K 4.5 01/04/2024   CL 105 01/04/2024   CO2 25 01/04/2024    Lab Results  Component Value Date   ALT 15 01/04/2024   AST 19 01/04/2024   ALKPHOS 75 01/04/2024   BILITOT 0.3 01/04/2024     Assessment & Plan:  #Hx of recurrent vulvar cellulitis in the setting of stage III vulvar cancer - Patient had undergone surgical intervention for vulvar cancer back in May 2025, she was given Bactrim  for surgical site infection.  Noted to have episode of recurrent cellulitis at that point she also had a hospitalization in the interim which was felt to be secondary cellulitis then developed shingles on the left thigh she was discharged on doxycycline and valacyclovir Plan -If concern for cellulitis returns then start linezolid x 1 week and inform ID/Gyn. Pt received linexolid x 7 day on 9/29 for cellulitis/ with some improvement. -Saturday felt dehydrated. Redness started night. I don't appreciate signs of cellulitis. Some erythema at beltine but that seems 2/2 to clothing. Ok with doing 7 day coarse of abx, given patient's hisotry. -F.U with ID PRN      Loney Stank, MD [facility]   01/29/24  4:10 PM I have personally spent 41 minutes involved in face-to-face and non-face-to-face activities for this patient on the day of the visit. Professional time  spent includes the following activities: Preparing to see the patient (review of tests), Obtaining and/or reviewing separately obtained history (admission/discharge record), Performing a medically appropriate examination and/or evaluation , Ordering medications/tests/procedures, referring and communicating with other health care professionals, Documenting clinical information in the EMR, Independently interpreting results (not separately reported), Communicating results to the patient/family/caregiver, Counseling and educating the patient/family/caregiver and Care coordination (not separately reported).

## 2024-01-30 ENCOUNTER — Other Ambulatory Visit (HOSPITAL_BASED_OUTPATIENT_CLINIC_OR_DEPARTMENT_OTHER): Payer: Self-pay

## 2024-02-01 ENCOUNTER — Inpatient Hospital Stay

## 2024-02-01 ENCOUNTER — Inpatient Hospital Stay: Attending: Oncology | Admitting: Oncology

## 2024-02-01 ENCOUNTER — Telehealth: Payer: Self-pay | Admitting: Oncology

## 2024-02-01 ENCOUNTER — Encounter: Payer: Self-pay | Admitting: Oncology

## 2024-02-01 VITALS — BP 149/69 | HR 56 | Temp 98.0°F | Resp 16 | Ht 63.0 in | Wt 210.9 lb

## 2024-02-01 DIAGNOSIS — D61818 Other pancytopenia: Secondary | ICD-10-CM | POA: Diagnosis not present

## 2024-02-01 DIAGNOSIS — C519 Malignant neoplasm of vulva, unspecified: Secondary | ICD-10-CM

## 2024-02-01 DIAGNOSIS — C50111 Malignant neoplasm of central portion of right female breast: Secondary | ICD-10-CM

## 2024-02-01 DIAGNOSIS — Z923 Personal history of irradiation: Secondary | ICD-10-CM | POA: Diagnosis not present

## 2024-02-01 DIAGNOSIS — M810 Age-related osteoporosis without current pathological fracture: Secondary | ICD-10-CM | POA: Diagnosis not present

## 2024-02-01 DIAGNOSIS — M85851 Other specified disorders of bone density and structure, right thigh: Secondary | ICD-10-CM | POA: Diagnosis not present

## 2024-02-01 DIAGNOSIS — Z79899 Other long term (current) drug therapy: Secondary | ICD-10-CM | POA: Diagnosis not present

## 2024-02-01 DIAGNOSIS — Z9221 Personal history of antineoplastic chemotherapy: Secondary | ICD-10-CM | POA: Diagnosis not present

## 2024-02-01 LAB — CBC WITH DIFFERENTIAL (CANCER CENTER ONLY)
Abs Immature Granulocytes: 0.02 K/uL (ref 0.00–0.07)
Basophils Absolute: 0 K/uL (ref 0.0–0.1)
Basophils Relative: 1 %
Eosinophils Absolute: 0.2 K/uL (ref 0.0–0.5)
Eosinophils Relative: 4 %
HCT: 32.5 % — ABNORMAL LOW (ref 36.0–46.0)
Hemoglobin: 10.7 g/dL — ABNORMAL LOW (ref 12.0–15.0)
Immature Granulocytes: 0 %
Lymphocytes Relative: 20 %
Lymphs Abs: 1 K/uL (ref 0.7–4.0)
MCH: 27 pg (ref 26.0–34.0)
MCHC: 32.9 g/dL (ref 30.0–36.0)
MCV: 82.1 fL (ref 80.0–100.0)
Monocytes Absolute: 0.6 K/uL (ref 0.1–1.0)
Monocytes Relative: 12 %
Neutro Abs: 3.2 K/uL (ref 1.7–7.7)
Neutrophils Relative %: 63 %
Platelet Count: 184 K/uL (ref 150–400)
RBC: 3.96 MIL/uL (ref 3.87–5.11)
RDW: 12.8 % (ref 11.5–15.5)
WBC Count: 5.1 K/uL (ref 4.0–10.5)
nRBC: 0 % (ref 0.0–0.2)

## 2024-02-01 LAB — CMP (CANCER CENTER ONLY)
ALT: 18 U/L (ref 0–44)
AST: 24 U/L (ref 15–41)
Albumin: 4.2 g/dL (ref 3.5–5.0)
Alkaline Phosphatase: 74 U/L (ref 38–126)
Anion gap: 11 (ref 5–15)
BUN: 19 mg/dL (ref 8–23)
CO2: 26 mmol/L (ref 22–32)
Calcium: 9.4 mg/dL (ref 8.9–10.3)
Chloride: 102 mmol/L (ref 98–111)
Creatinine: 1.15 mg/dL — ABNORMAL HIGH (ref 0.44–1.00)
GFR, Estimated: 50 mL/min — ABNORMAL LOW (ref >60)
Glucose, Bld: 102 mg/dL — ABNORMAL HIGH (ref 70–99)
Potassium: 4.5 mmol/L (ref 3.5–5.1)
Sodium: 139 mmol/L (ref 135–145)
Total Bilirubin: <0.2 mg/dL (ref 0.0–1.2)
Total Protein: 6.5 g/dL (ref 6.5–8.1)

## 2024-02-01 LAB — MAGNESIUM: Magnesium: 1.9 mg/dL (ref 1.7–2.4)

## 2024-02-01 NOTE — Progress Notes (Signed)
 [facility]  [address] [place],  [place]  [address] [phone]  Clinic Day:  02/01/2024  Referring physician: [name], MD  ASSESSMENT & PLAN:   Assessment: Invasive lobular carcinoma This was found on screening mammogram but the MRI reveals focal enhancement surrounding the biopsy clip up to 2.3 cm and there was non-mass enhancement posterior to this known malignancy on MRI.  This area was biopsied in order to guide her ultimate surgery.This is strongly ER/PR positive, HER2 negative and has a low Ki 67 of less than 5%.  She has now had a lumpectomy but had to go back for reexcision of the margins.  The sentinel lymph node was negative but she did have 2 primaries, measuring 2.4 cm and 2.2 cm, for a T2 N0 M0, stage IIA.  Even though she has several favorable characteristics, such as grade 1 histology and a low Ki 67, I still recommended that we pursue Endopredict testing to quantitate her risk for recurrence, and fortunately she has an EpClin score of 2.6, low risk.  This correlates with a 5.1% risk of distant recurrence in the next 10 years. Her benefit from chemotherapy would be 1.0% so we did not pursue. Her risk for a late recurrence is 4.0%. Unfortunately she has refused hormonal therapy.    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  She was treated with pelvic vulvectomy, bilateral debulking, radical lymphadenectomy, and inguinal dissection in April 2025.  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  There was lymphovascular invasion and carcinoma in situ at the 6 o'clock margin with < 1 mm margin for invasive carcinoma. PET scan revealed metastatic left inguinal lymph nodes up to 1.8 cm in diameter, but no additional evidence of metastatic disease in the  neck, chest, abdomen or pelvis. There was a 4 mm right lower lobe nodule, too small for PET resolution.    She was receiving concurrent chemoradiation with weekly cisplatin , as well as pembrolizuab every 3 weeks since her PDL 1 score is 85%, followed by maintenance pembrolizumab .  She had difficulty tolerating treatment due to nausea, vomiting and diarrhea, as well as hypomagnesemia.  We gave her additional IV fluids weekly and IV magnesium  as needed.  She developed mild pancytopenia felt to be most likely due to treatment.  After her third cycle of weekly cisplatin , she was hospitalized with sepsis due to recurrent cellulitis of the left vulva and thigh.  She then developed shingles of the left thigh. Due to the multiple issues with chemotherapy and radiation, she has declined taking any further therapy. She had about half of her treatment. I offered her continuation of immunotherapy but she declines that as well.   Cellulitis of the genitalia She has had multiple recurrences with severe erythema, pain, and fever which has responded to antibiotics. She had to stop the antibiotics last time due to diarrhea and IBS, and her infection cleared. I will prescribe Diflucan  for recurrent yeast infection in view of her excessive pruritus. She has seen Infectious disease and they feel this will be a recurring problem. She has a flare now and was placed back on Zyvox for one week.   Osteopenia Her spine shows a T score of -2.6 for osteoporosis and the right femur has osteopenia.  At the  very least she should be taking calcium  and vitamin D. Bone density scan done on 07/17/2023 revealed osteopenia.   Strong family history Including multiple females with breast cancer, her mother had colon cancer at age 27, and another maternal aunt had ovarian cancer.  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  She has been counseled on the implications of this and is already getting  regular colonoscopy.  However the information will also be passed on to family members to pursue testing.    Plan: She reports having a flare up of genital cellulitis characterized by erythema, swelling, and pain that began Sunday evening. She visited Infectious Disease on 12/25/2023 and they instructed her to begin 600 mg Zyvox BID for 7 days. She had some improvement of symptoms. They feel that she will likely have flares of this on and off and so will need to take a course of Zyvox when that occurs. She is also taking Diflucan  for yeast.  She has a WBC of 5.1, low hemoglobin of 10.7 improved from 10.5, and platelet count of 184,000. Her CMP was normal other than a mildly elevated creatinine of 1.15. Her magnesium  is 1.9. I will see her back in 4 weeks with CBC and CMP. Dr. Viktoria will be seeing her in January. We will schedule her follow-up and mammogram in February 2026. The patient understands the plans discussed today and is in agreement with them.  She knows to contact our office if she develops concerns prior to her next appointment.   I provided 16 minutes of face-to-face time during this encounter and > 50% was spent counseling as documented under my assessment and plan.    [name], MD  [facility] - A DEPT OF [facility] [address] [place] [address] Dept: [phone] Dept Fax: [phone]    No orders of the defined types were placed in this encounter.  CHIEF COMPLAINT:  CC: Stage IIIC vulvar cancer  Current Treatment: Observation  HISTORY OF PRESENT ILLNESS:   Oncology History  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)  06/08/2021 Initial Diagnosis   Breast cancer in female [facility])   07/23/2021 Genetic Testing   Negative hereditary cancer genetic testing: no pathogenic variants detected in [facility] BRCAPlus Panel. Report date is July 23, 2021.  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  The report date is Jul 26, 2021.  The BRCAplus panel offered by [facility] and includes sequencing and deletion/duplication analysis for the following 8 genes: ATM, BRCA1, BRCA2, CDH1, CHEK2, PALB2, PTEN, and TP53.  Results of pan-cancer panel pending.   The CancerNext-Expanded gene panel offered by [facility] and includes sequencing and rearrangement analysis for the following 77 genes: AIP, ALK, APC*, ATM*, AXIN2, BAP1, BARD1, BLM, BMPR1A, BRCA1*, BRCA2*, BRIP1*, CDC73, CDH1*, CDK4, CDKN1B, CDKN2A, CHEK2*, CTNNA1, DICER1, FANCC, FH, FLCN, GALNT12, KIF1B, LZTR1, MAX, MEN1, MET, MLH1*, MSH2*, MSH3, MSH6*, MUTYH*, NBN, NF1*, NF2, NTHL1, PALB2*, PHOX2B, PMS2*, POT1, PRKAR1A, PTCH1, PTEN*, RAD51C*, RAD51D*, RB1, RECQL, RET, SDHA, SDHAF2, SDHB, SDHC, SDHD, SMAD4, SMARCA4, SMARCB1, SMARCE1, STK11, SUFU, TMEM127, TP53*, TSC1, TSC2, VHL and XRCC2 (sequencing and deletion/duplication); EGFR, EGLN1, HOXB13, KIT, MITF, PDGFRA, POLD1, and POLE (sequencing only); EPCAM and GREM1 (deletion/duplication only). DNA and RNA analyses performed for * genes.    08/23/2021 Cancer Staging  Staging form: Breast, AJCC 8th Edition - Pathologic stage from 08/23/2021: Stage IA (pT2(2), pN0(sn), cM0, G1, ER+, PR+, HER2-) - Signed by Cornelius Wanda DEL, MD on 09/29/2021 Histopathologic type: Lobular carcinoma, NOS Stage prefix: Initial diagnosis Method of lymph node assessment: Sentinel lymph node biopsy Nuclear grade: G1 Multigene prognostic tests performed: EndoPredict Histologic grading system: 3 grade system Residual tumor (R): R0 - None Laterality: Right Tumor size (mm): 24 Multiple tumors: Yes Number of tumors: 2 Lymph-vascular invasion (LVI): LVI not present (absent)/not identified Diagnostic confirmation: Positive histology PLUS positive immunophenotyping and/or positive genetic  studies Specimen type: Excision Staged by: Managing physician Menopausal status: Postmenopausal Ki-67 (%): 5 Stage used in treatment planning: Yes National guidelines used in treatment planning: Yes Type of national guideline used in treatment planning: NCCN   Vulvar cancer (HCC)  06/28/2023 Initial Diagnosis   Vulvar cancer (HCC)   06/28/2023 Cancer Staging   Staging form: Vulva, AJCC V9 - Clinical stage from 06/28/2023: FIGO Stage IIIC (cT1b, cN1c, cM0) - Signed by Cornelius Wanda DEL, MD on 07/31/2023 Histopathologic type: Squamous cell carcinoma, NOS Stage prefix: Initial diagnosis Method of lymph node assessment: Lymph node dissection Histologic grade (G): G2 Histologic grading system: 3 grade system Tumor size (mm): 30 Lymph-vascular invasion (LVI): LVI present/identified, NOS Diagnostic confirmation: Positive histology Specimen type: Excision Staged by: Managing physician Femoral-inguinal nodal status: Positive Solitary (s) or multifocal (m) tumors in the primary site: Solitary Perineural invasion (PNI): Unknown Stage used in treatment planning: Yes National guidelines used in treatment planning: Yes Type of national guideline used in treatment planning: NCCN   09/04/2023 - 09/18/2023 Chemotherapy   Patient is on Treatment Plan : CERVICAL Pembrolizumab  q21d + XRT (cisplatin  d/c'd 09/25/23)     12/22/2023 - 12/22/2023 Chemotherapy   Patient is on Treatment Plan : CERVICAL Pembrolizumab  (200) q21d         INTERVAL HISTORY:   Doris Reyes is here today for repeat clinical assessment of her stage IIA breast cancer and stage IIIC vulvar cancer. Patient states that she feels fair and reports having a flare up of genital cellulitis characterized by erythema, swelling, and pain that began Sunday evening. She visited Infectious Disease on 12/25/2023 and they instructed her to begin 600 mg Zyvox BID for 7 days. She had some improvement of symptoms. They feel that she will likely have flares of  this on and off and so will need to take a course of Zyvox when that occurs. She is also taking Diflucan  for yeast.  She has a WBC of 5.1, low hemoglobin of 10.7 improved from 10.5, and platelet count of 184,000. Her CMP was normal other than a mildly elevated creatinine of 1.15. Her magnesium  is 1.9. I will see her back in 4 weeks with CBC and CMP. Dr. Viktoria will be seeing her in January. We will schedule her follow-up and mammogram in February 2026.   She denies fever, chills, night sweats, or other signs of infection. She denies cardiorespiratory and gastrointestinal issues. She  denies pain. Her appetite is good and Her weight has increased 2 pounds over last 4 weeks.  REVIEW OF SYSTEMS:   Review of Systems  Constitutional:  Positive for fatigue. Negative for appetite change, chills, diaphoresis, fever and unexpected weight change.  HENT:   Negative for lump/mass, mouth sores, sore throat and trouble swallowing.   Respiratory:  Negative for cough, hemoptysis and shortness of breath.   Cardiovascular:  Negative for chest pain and leg swelling.  Gastrointestinal:  Negative for abdominal  pain, blood in stool, constipation, diarrhea, nausea and vomiting.  Genitourinary:  Positive for pelvic pain (occasional). Negative for difficulty urinating, dysuria, frequency, hematuria and vaginal bleeding.        Recurrent vulvar infections  Musculoskeletal:  Negative for arthralgias, back pain, gait problem and myalgias.  Skin:  Negative for rash.  Neurological:  Negative for dizziness, extremity weakness, gait problem, headaches and light-headedness.  Hematological:  Negative for adenopathy. Does not bruise/bleed easily.  Psychiatric/Behavioral:  Negative for depression and sleep disturbance. The patient is not nervous/anxious.      VITALS:   Blood pressure (!) 149/69, pulse (!) 56, temperature 98 F (36.7 C), temperature source Oral, resp. rate 16, height 5' 3 (1.6 m), weight 210 lb 14.4 oz (95.7  kg), SpO2 100%.  Wt Readings from Last 3 Encounters:  02/01/24 210 lb 14.4 oz (95.7 kg)  01/29/24 211 lb (95.7 kg)  01/04/24 208 lb 11.2 oz (94.7 kg)    Body mass index is 37.36 kg/m.  Performance status (ECOG): 1 - Symptomatic but completely ambulatory  PHYSICAL EXAM:   Physical Exam Vitals and nursing note reviewed.  Constitutional:      General: She is not in acute distress.    Appearance: Normal appearance. She is not ill-appearing.  HENT:     Head: Normocephalic and atraumatic.     Mouth/Throat:     Mouth: Mucous membranes are moist.     Pharynx: Oropharynx is clear. No oropharyngeal exudate or posterior oropharyngeal erythema.  Eyes:     General: No scleral icterus.    Extraocular Movements: Extraocular movements intact.     Conjunctiva/sclera: Conjunctivae normal.     Pupils: Pupils are equal, round, and reactive to light.  Cardiovascular:     Rate and Rhythm: Normal rate and regular rhythm.     Heart sounds: Normal heart sounds. No murmur heard.    No friction rub. No gallop.  Pulmonary:     Effort: Pulmonary effort is normal.     Breath sounds: Normal breath sounds. No wheezing, rhonchi or rales.  Abdominal:     General: There is no distension.     Palpations: Abdomen is soft. There is no hepatomegaly, splenomegaly or mass.     Tenderness: There is no abdominal tenderness.     Comments: She has barely any residual erythema of the lower abdominal wall.  Musculoskeletal:        General: Normal range of motion.     Cervical back: Normal range of motion and neck supple. No tenderness.     Right lower leg: No edema.     Left lower leg: No edema.  Lymphadenopathy:     Cervical: No cervical adenopathy.     Upper Body:     Right upper body: No supraclavicular or axillary adenopathy.     Left upper body: No supraclavicular or axillary adenopathy.     Lower Body: No right inguinal adenopathy. No left inguinal adenopathy.  Skin:    General: Skin is warm and dry.      Coloration: Skin is not jaundiced.     Findings: No rash.     Comments: Decreased skin turgor  Neurological:     Mental Status: She is alert and oriented to person, place, and time.     Cranial Nerves: No cranial nerve deficit.  Psychiatric:        Mood and Affect: Mood normal.        Behavior: Behavior normal.  Thought Content: Thought content normal.     LABS:      Latest Ref Rng & Units 02/01/2024   12:59 PM 01/04/2024   10:13 AM 12/21/2023   11:00 AM  CBC  WBC 4.0 - 10.5 K/uL 5.1  4.0  4.9   Hemoglobin 12.0 - 15.0 g/dL 89.2  89.4  89.7   Hematocrit 36.0 - 46.0 % 32.5  32.3  31.5   Platelets 150 - 400 K/uL 184  187  256       Latest Ref Rng & Units 02/01/2024   12:59 PM 01/04/2024   10:13 AM 12/21/2023   11:00 AM  CMP  Glucose 70 - 99 mg/dL 897  890  98   BUN 8 - 23 mg/dL 19  25  23    Creatinine 0.44 - 1.00 mg/dL 8.84  8.86  8.88   Sodium 135 - 145 mmol/L 139  140  141   Potassium 3.5 - 5.1 mmol/L 4.5  4.5  4.2   Chloride 98 - 111 mmol/L 102  105  106   CO2 22 - 32 mmol/L 26  25  24    Calcium  8.9 - 10.3 mg/dL 9.4  9.3  9.6   Total Protein 6.5 - 8.1 g/dL 6.5  6.3  6.7   Total Bilirubin 0.0 - 1.2 mg/dL <9.7  0.3  0.3   Alkaline Phos 38 - 126 U/L 74  75  88   AST 15 - 41 U/L 24  19  20    ALT 0 - 44 U/L 18  15  17      Lab Results  Component Value Date   TIBC 441 10/11/2023   FERRITIN 65 10/11/2023   IRONPCTSAT 14 10/11/2023    STUDIES:   No results found.    HISTORY:   Past Medical History:  Diagnosis Date   Anemia    Anxiety    Cancer (HCC)    right breast ILC   Complication of anesthesia    difficulty waking up   Depression    Diverticulitis    Family history of breast cancer    Family history of ovarian cancer    Family history of prostate cancer    Fibromyalgia    GERD (gastroesophageal reflux disease)    Heart murmur    History of colonic polyps    History of hiatal hernia    Hyperlipidemia    Hypertension    IBS (irritable bowel  syndrome)    Ischemic colitis 03/2012   Osteoporosis    Pre-diabetes    RLS (restless legs syndrome)    Sinusitis    Sleep apnea    CPAP nightly    Past Surgical History:  Procedure Laterality Date   BREAST LUMPECTOMY WITH RADIOACTIVE SEED LOCALIZATION Right 08/04/2021   Procedure: RIGHT BREAST LUMPECTOMY WITH RADIOACTIVE SEED LOCALIZATION;  Surgeon: Vanderbilt Ned, MD;  Location: [facility];  Service: General;  Laterality: Right;   CHOLECYSTECTOMY  2004   COLONOSCOPY WITH PROPOFOL  N/A 04/18/2017   Procedure: COLONOSCOPY WITH PROPOFOL ;  Surgeon: Kristie Lamprey, MD;  Location: [facility];  Service: Endoscopy;  Laterality: N/A;   ESOPHAGOGASTRODUODENOSCOPY (EGD) WITH PROPOFOL  N/A 04/18/2017   Procedure: ESOPHAGOGASTRODUODENOSCOPY (EGD) WITH PROPOFOL ;  Surgeon: Kristie Lamprey, MD;  Location: [facility];  Service: Endoscopy;  Laterality: N/A;   EXAM UNDER ANESTHESIA, PELVIC N/A 06/28/2023   Procedure: EXAM UNDER ANESTHESIA, PELVIC;  Surgeon: Viktoria Comer SAUNDERS, MD;  Location: [facility];  Service: Gynecology;  Laterality: N/A;   FLEXIBLE SIGMOIDOSCOPY  04/20/2012   Procedure: FLEXIBLE SIGMOIDOSCOPY;  Surgeon: Belvie JONETTA Just, MD;  Location: [facility];  Service: Endoscopy;  Laterality: N/A;   INGUINAL LYMPHADENECTOMY N/A 06/28/2023   Procedure: LYMPHADENECTOMY, INGUINAL, OPEN;  Surgeon: Viktoria Comer SAUNDERS, MD;  Location: [facility];  Service: Gynecology;  Laterality: N/A;   IR IMAGING GUIDED PORT INSERTION  07/31/2023   NISSEN FUNDOPLICATION  2011   PARAESOPHAGEAL HERNIA REPAIR  09/11/2009   and Nissen fundoplication   RADICAL VULVECTOMY N/A 06/28/2023   Procedure: VULVECTOMY, RADICAL;  Surgeon: Viktoria Comer SAUNDERS, MD;  Location: [facility];  Service: Gynecology;  Laterality: N/A;  Possible skin flap   RE-EXCISION OF BREAST LUMPECTOMY Right 08/18/2021   Procedure: RE-EXCISION RIGHT BREAST LUMPECTOMY;  Surgeon: Vanderbilt Ned, MD;  Location: [facility];  Service: General;  Laterality: Right;    SENTINEL NODE BIOPSY N/A 08/04/2021   Procedure: SENTINEL NODE BIOPSY;  Surgeon: Vanderbilt Ned, MD;  Location: [facility];  Service: General;  Laterality: N/A;   TONSILLECTOMY  1960's   TOTAL ABDOMINAL HYSTERECTOMY  2001   w/ BSO    Family History  Problem Relation Age of Onset   Colon cancer Mother 40   Alzheimer's disease Mother    Heart attack Father    Aneurysm Paternal Grandmother        brain   Stroke Paternal Grandfather    Cancer Maternal Aunt        x2   Alzheimer's disease Maternal Aunt    Ovarian cancer Maternal Aunt    Prostate cancer Maternal Uncle    Kidney disease Maternal Uncle    Atrial fibrillation Maternal Uncle    Stroke Paternal Uncle    Prostate cancer Cousin        paternal first cousin   Prostate cancer Cousin        paternal first cousin   Esophageal cancer Cousin        maternal first cousin   Breast cancer Cousin        DCIS, maternal first cousin   Ovarian cancer Cousin        maternal first cousin   Brain cancer Cousin    Rheum arthritis Other    Lung disease Neg Hx     Social History:  reports that she has never smoked. She has never used smokeless tobacco. She reports that she does not drink alcohol and does not use drugs.The patient is alone today.  Allergies:  Allergies  Allergen Reactions   Cefuroxime Other (See Comments), Diarrhea and Nausea Only    IBS   Sulfa  Antibiotics Nausea And Vomiting and Nausea Only   Tramadol Other (See Comments) and Nausea Only   Ceftin [Cefuroxime Axetil] Diarrhea   Compazine  [Prochlorperazine ] Other (See Comments)    Vision problems   Ondansetron  Hcl    Other Other (See Comments)    Pneumonia vaccine (uncoded)   Tramadol Hcl Nausea And Vomiting   Zofran  [Ondansetron ] Other (See Comments)    Vision changes    Current Medications: Current Outpatient Medications  Medication Sig Dispense Refill   acetaminophen  (TYLENOL ) 650 MG CR tablet 1,300 mg in the morning and at bedtime.     amLODipine   (NORVASC ) 5 MG tablet Take 5 mg by mouth daily.     Ascorbic Acid (VITAMIN C) 500 MG CHEW Chew 500 mg by mouth daily.     aspirin EC 81 MG tablet Take 81 mg by mouth at bedtime. Swallow whole.     atenolol  (TENORMIN ) 50 MG tablet  Take 75 mg by mouth daily.      benazepril  (LOTENSIN ) 10 MG tablet Take 10 mg by mouth daily.     benzonatate (TESSALON) 100 MG capsule Take 100-200 mg by mouth at bedtime.     Biotin 1000 MCG tablet Take 1,000 mcg by mouth daily.     Calcium  Citrate-Vitamin D (CITRACAL + D PO) Take 1 tablet by mouth daily.     cetirizine (ZYRTEC) 10 MG tablet Take 10 mg by mouth at bedtime.     chlorpheniramine-HYDROcodone (TUSSIONEX) 10-8 MG/5ML Take 5 mLs by mouth every 12 (twelve) hours as needed for cough. 90 mL 0   Cholecalciferol (VITAMIN D) 50 MCG (2000 UT) tablet Take 2,000 Units by mouth daily.     cyanocobalamin  1000 MCG tablet Take 1,000 mcg by mouth daily.     diclofenac sodium (VOLTAREN) 1 % GEL Apply 1 application  topically daily as needed (for pain).     diphenhydrAMINE  (BENADRYL ) 25 MG tablet Take 25 mg by mouth every 6 (six) hours as needed for allergies.     diphenoxylate-atropine (LOMOTIL) 2.5-0.025 MG tablet Take 2 tablets by mouth 4 (four) times daily as needed for diarrhea or loose stools.     DULoxetine  (CYMBALTA ) 60 MG capsule 2 capsules Orally once a day     EPINEPHrine  (EPIPEN  2-PAK) 0.3 mg/0.3 mL IJ SOAJ injection Inject 0.3 mg into the muscle as needed for anaphylaxis.     famotidine  (PEPCID ) 20 MG tablet Take 20 mg by mouth at bedtime.     fexofenadine (ALLEGRA) 180 MG tablet Take 180 mg by mouth in the morning.     fluconazole  (DIFLUCAN ) 200 MG tablet Take 1 tablet (200 mg total) by mouth daily. 10 tablet 1   fluticasone  (FLONASE ) 50 MCG/ACT nasal spray Place 1 spray into both nostrils daily.     lidocaine  (XYLOCAINE ) 5 % ointment Apply 1 Application topically 2 (two) times daily as needed for moderate pain (pain score 4-6) (to the vulva). Apply to vulva  as needed for discomfort 35.44 g 3   lidocaine  4 % Place 1 patch onto the skin daily.     linezolid (ZYVOX) 600 MG tablet Take 1 tablet (600 mg total) by mouth 2 (two) times daily. 14 tablet 0   NON FORMULARY Pt uses a c-pap nightly     Nutritional Supplements (JUICE PLUS FIBRE PO) Take 2 each by mouth daily. Fruits and Vegetables     nystatin  (MYCOSTATIN /NYSTOP ) powder Apply 1 Application topically 3 (three) times daily. 30 g 5   Omega-3 Fatty Acids (FISH OIL) 1000 MG CAPS Take 1,000 mg by mouth daily.      pantoprazole  (PROTONIX ) 40 MG tablet Take 40 mg by mouth every evening.     Phenylephrine -APAP-Guaifenesin (TYLENOL  SINUS SEVERE PO) Take 2 tablets by mouth daily as needed (drainage).     polyvinyl alcohol (LIQUIFILM TEARS) 1.4 % ophthalmic solution Place 1 drop into both eyes 5 (five) times daily.     Probiotic Product (PROBIOTIC & ACIDOPHILUS EX ST PO) Take 1 capsule by mouth daily. Ultra Flora Plus Capsules     Propylene Glycol (SYSTANE COMPLETE) 0.6 % SOLN Place 1 drop into both eyes in the morning and at bedtime.     rOPINIRole  (REQUIP ) 0.5 MG tablet Take 0.5 mg by mouth at bedtime.     senna-docusate (SENOKOT-S) 8.6-50 MG tablet Take 2 tablets by mouth at bedtime. For AFTER surgery, do not take if having diarrhea 30 tablet 0   simvastatin  (ZOCOR )  10 MG tablet Take 10 mg by mouth at bedtime.       sucralfate  (CARAFATE ) 1 GM/10ML suspension Take 1 g by mouth 2 (two) times daily.     traZODone  (DESYREL ) 50 MG tablet Take 50 mg by mouth at bedtime.     triamcinolone  (KENALOG ) 0.025 % ointment Apply 1 Application topically 2 (two) times daily. 30 g 0   vitamin E 400 UNIT capsule Take 400 Units by mouth daily.       White Petrolatum -Mineral Oil (REFRESH P.M. OP) Place 1 Application into both eyes at bedtime.     No current facility-administered medications for this visit.   I,Taji Sather H Lugenia Assefa,acting as a scribe for [name], MD.,have documented all relevant documentation on  the behalf of [name], MD,as directed by  [name], MD while in the presence of [name], MD.  I have reviewed this report as typed by the medical scribe, and it is complete and accurate.

## 2024-02-01 NOTE — Telephone Encounter (Signed)
 Patient has been scheduled for follow-up visit per 02/01/24 LOS.  Pt given an appt calendar with date and time.

## 2024-02-14 ENCOUNTER — Encounter: Payer: Self-pay | Admitting: Hematology and Oncology

## 2024-02-19 ENCOUNTER — Telehealth: Payer: Self-pay

## 2024-02-19 NOTE — Telephone Encounter (Signed)
 Pt called req to see Dr. She thinks she had food poisoning over the weekend after eating @ restaurant in [place]. She had diarrhea pretty much all day. By end of day, she saw blood in stool. She did take 2 doses of Imodium. No diarrhea as of yet this morning. She mentioned she has hx ischemic colitis, but doesn't think this is the case now. She asked for someone to see her and maybe get IVF. No dizziness/light headiness,. She is drinking, but not eating much. Please advise.

## 2024-02-23 ENCOUNTER — Other Ambulatory Visit (HOSPITAL_BASED_OUTPATIENT_CLINIC_OR_DEPARTMENT_OTHER): Payer: Self-pay

## 2024-02-26 ENCOUNTER — Other Ambulatory Visit (HOSPITAL_BASED_OUTPATIENT_CLINIC_OR_DEPARTMENT_OTHER): Payer: Self-pay

## 2024-02-26 DIAGNOSIS — H04123 Dry eye syndrome of bilateral lacrimal glands: Secondary | ICD-10-CM | POA: Diagnosis not present

## 2024-02-29 ENCOUNTER — Encounter: Payer: Self-pay | Admitting: Oncology

## 2024-02-29 ENCOUNTER — Other Ambulatory Visit: Payer: Self-pay | Admitting: Oncology

## 2024-02-29 ENCOUNTER — Other Ambulatory Visit: Payer: Self-pay

## 2024-02-29 ENCOUNTER — Inpatient Hospital Stay

## 2024-02-29 ENCOUNTER — Inpatient Hospital Stay: Attending: Oncology | Admitting: Oncology

## 2024-02-29 ENCOUNTER — Telehealth: Payer: Self-pay | Admitting: Oncology

## 2024-02-29 VITALS — BP 136/68 | HR 67 | Temp 98.1°F | Resp 16 | Ht 63.0 in | Wt 213.7 lb

## 2024-02-29 DIAGNOSIS — Z1501 Genetic susceptibility to malignant neoplasm of breast: Secondary | ICD-10-CM | POA: Insufficient documentation

## 2024-02-29 DIAGNOSIS — B379 Candidiasis, unspecified: Secondary | ICD-10-CM | POA: Insufficient documentation

## 2024-02-29 DIAGNOSIS — Z923 Personal history of irradiation: Secondary | ICD-10-CM | POA: Insufficient documentation

## 2024-02-29 DIAGNOSIS — Z1231 Encounter for screening mammogram for malignant neoplasm of breast: Secondary | ICD-10-CM

## 2024-02-29 DIAGNOSIS — M85851 Other specified disorders of bone density and structure, right thigh: Secondary | ICD-10-CM | POA: Diagnosis not present

## 2024-02-29 DIAGNOSIS — C519 Malignant neoplasm of vulva, unspecified: Secondary | ICD-10-CM

## 2024-02-29 DIAGNOSIS — M810 Age-related osteoporosis without current pathological fracture: Secondary | ICD-10-CM | POA: Diagnosis not present

## 2024-02-29 DIAGNOSIS — Z1502 Genetic susceptibility to malignant neoplasm of ovary: Secondary | ICD-10-CM | POA: Insufficient documentation

## 2024-02-29 DIAGNOSIS — Z79899 Other long term (current) drug therapy: Secondary | ICD-10-CM | POA: Insufficient documentation

## 2024-02-29 DIAGNOSIS — Z8041 Family history of malignant neoplasm of ovary: Secondary | ICD-10-CM | POA: Diagnosis not present

## 2024-02-29 DIAGNOSIS — L299 Pruritus, unspecified: Secondary | ICD-10-CM | POA: Insufficient documentation

## 2024-02-29 DIAGNOSIS — Z800 Family history of malignant neoplasm of digestive organs: Secondary | ICD-10-CM | POA: Insufficient documentation

## 2024-02-29 DIAGNOSIS — D649 Anemia, unspecified: Secondary | ICD-10-CM | POA: Diagnosis not present

## 2024-02-29 DIAGNOSIS — C50111 Malignant neoplasm of central portion of right female breast: Secondary | ICD-10-CM

## 2024-02-29 DIAGNOSIS — D6481 Anemia due to antineoplastic chemotherapy: Secondary | ICD-10-CM

## 2024-02-29 DIAGNOSIS — K2100 Gastro-esophageal reflux disease with esophagitis, without bleeding: Secondary | ICD-10-CM | POA: Insufficient documentation

## 2024-02-29 DIAGNOSIS — Z803 Family history of malignant neoplasm of breast: Secondary | ICD-10-CM | POA: Diagnosis not present

## 2024-02-29 DIAGNOSIS — Z1239 Encounter for other screening for malignant neoplasm of breast: Secondary | ICD-10-CM

## 2024-02-29 DIAGNOSIS — Z1509 Genetic susceptibility to other malignant neoplasm: Secondary | ICD-10-CM | POA: Insufficient documentation

## 2024-02-29 DIAGNOSIS — Z9221 Personal history of antineoplastic chemotherapy: Secondary | ICD-10-CM | POA: Insufficient documentation

## 2024-02-29 LAB — IRON AND TIBC
Iron: 57 ug/dL (ref 28–170)
Saturation Ratios: 12 % (ref 10.4–31.8)
TIBC: 498 ug/dL — ABNORMAL HIGH (ref 250–450)
UIBC: 441 ug/dL

## 2024-02-29 LAB — CMP (CANCER CENTER ONLY)
ALT: 20 U/L (ref 0–44)
AST: 23 U/L (ref 15–41)
Albumin: 4 g/dL (ref 3.5–5.0)
Alkaline Phosphatase: 74 U/L (ref 38–126)
Anion gap: 10 (ref 5–15)
BUN: 20 mg/dL (ref 8–23)
CO2: 25 mmol/L (ref 22–32)
Calcium: 9.2 mg/dL (ref 8.9–10.3)
Chloride: 104 mmol/L (ref 98–111)
Creatinine: 1.2 mg/dL — ABNORMAL HIGH (ref 0.44–1.00)
GFR, Estimated: 48 mL/min — ABNORMAL LOW (ref >60)
Glucose, Bld: 100 mg/dL — ABNORMAL HIGH (ref 70–99)
Potassium: 3.8 mmol/L (ref 3.5–5.1)
Sodium: 139 mmol/L (ref 135–145)
Total Bilirubin: 0.2 mg/dL (ref 0.0–1.2)
Total Protein: 6.3 g/dL — ABNORMAL LOW (ref 6.5–8.1)

## 2024-02-29 LAB — CBC WITH DIFFERENTIAL (CANCER CENTER ONLY)
Abs Immature Granulocytes: 0.02 K/uL (ref 0.00–0.07)
Basophils Absolute: 0 K/uL (ref 0.0–0.1)
Basophils Relative: 1 %
Eosinophils Absolute: 0.2 K/uL (ref 0.0–0.5)
Eosinophils Relative: 5 %
HCT: 31.8 % — ABNORMAL LOW (ref 36.0–46.0)
Hemoglobin: 10.4 g/dL — ABNORMAL LOW (ref 12.0–15.0)
Immature Granulocytes: 0 %
Lymphocytes Relative: 21 %
Lymphs Abs: 1 K/uL (ref 0.7–4.0)
MCH: 26.6 pg (ref 26.0–34.0)
MCHC: 32.7 g/dL (ref 30.0–36.0)
MCV: 81.3 fL (ref 80.0–100.0)
Monocytes Absolute: 0.7 K/uL (ref 0.1–1.0)
Monocytes Relative: 14 %
Neutro Abs: 2.8 K/uL (ref 1.7–7.7)
Neutrophils Relative %: 59 %
Platelet Count: 195 K/uL (ref 150–400)
RBC: 3.91 MIL/uL (ref 3.87–5.11)
RDW: 14.5 % (ref 11.5–15.5)
WBC Count: 4.8 K/uL (ref 4.0–10.5)
nRBC: 0 % (ref 0.0–0.2)

## 2024-02-29 LAB — FOLATE: Folate: 11.6 ng/mL (ref >5.9)

## 2024-02-29 LAB — MAGNESIUM: Magnesium: 2.1 mg/dL (ref 1.7–2.4)

## 2024-02-29 LAB — FERRITIN: Ferritin: 20 ng/mL (ref 11–307)

## 2024-02-29 NOTE — Telephone Encounter (Signed)
 Patient has been scheduled for follow-up visit per 02/29/2024 LOS.  Pt noted appt details on personal planner/calendar.

## 2024-02-29 NOTE — Progress Notes (Addendum)
 [facility]  [address] [place],  [place]  [address] [phone]  Clinic Day:  02/29/2024  Referring physician: [name], MD  ASSESSMENT & PLAN:  Assessment: Invasive lobular carcinoma This was found on screening mammogram but the MRI reveals focal enhancement surrounding the biopsy clip up to 2.3 cm and there was non-mass enhancement posterior to this known malignancy on MRI.  This area was biopsied in order to guide her ultimate surgery.This is strongly ER/PR positive, HER2 negative and has a low Ki 67 of less than 5%.  She has now had a lumpectomy but had to go back for reexcision of the margins.  The sentinel lymph node was negative but she did have 2 primaries, measuring 2.4 cm and 2.2 cm, for a T2 N0 M0, stage IIA.  Even though she has several favorable characteristics, such as grade 1 histology and a low Ki 67, I still recommended that we pursue Endopredict testing to quantitate her risk for recurrence, and fortunately she has an EpClin score of 2.6, low risk.  This correlates with a 5.1% risk of distant recurrence in the next 10 years. Her benefit from chemotherapy would be 1.0% so we did not pursue. Her risk for a late recurrence is 4.0%. Unfortunately she has refused hormonal therapy.    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  She was treated with pelvic vulvectomy, bilateral debulking, radical lymphadenectomy, and inguinal dissection in April 2025.  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  There was lymphovascular invasion and carcinoma in situ at the 6 o'clock margin with < 1 mm margin for invasive carcinoma. PET scan revealed metastatic left inguinal lymph nodes up to 1.8 cm in diameter, but no additional evidence of metastatic disease in the  neck, chest, abdomen or pelvis. There was a 4 mm right lower lobe nodule, too small for PET resolution.    She was receiving concurrent chemoradiation with weekly cisplatin , as well as pembrolizuab every 3 weeks since her PDL 1 score is 85%, followed by maintenance pembrolizumab .  She had difficulty tolerating treatment due to nausea, vomiting and diarrhea, as well as hypomagnesemia.  We gave her additional IV fluids weekly and IV magnesium  as needed.  She developed mild pancytopenia felt to be most likely due to treatment.  After her third cycle of weekly cisplatin , she was hospitalized with sepsis due to recurrent cellulitis of the left vulva and thigh.  She then developed shingles of the left thigh. Due to the multiple issues with chemotherapy and radiation, she has declined taking any further therapy. She had about half of her treatment. I offered her continuation of immunotherapy but she declines that as well.   Cellulitis of the genitalia She has had multiple recurrences with severe erythema, pain, and fever which has responded to antibiotics. She had to stop the antibiotics last time due to diarrhea and IBS, and her infection cleared. I will prescribe Diflucan  for recurrent yeast infection in view of her excessive pruritus. She has seen Infectious disease and they feel this will be a recurring problem. She still occasionally has days when it flares up.  Anemia This worsened while on therapy but had improved by November to a hemoglobin of 10.7.  Now it has drifted down to 10.4 so I will  recheck her iron and folate levels. She denies any overt bleeding and had EGD and colonoscopy in 2019.   Osteopenia Her spine shows a T score of -2.6 for osteoporosis and the right femur has osteopenia.  At the very least she should be taking calcium  and vitamin D. Bone density scan done on 07/17/2023 revealed osteopenia.   Strong family history Including multiple females with breast cancer, her mother had colon  cancer at age 68, and another maternal aunt had ovarian cancer.  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  She has been counseled on the implications of this and is already getting regular colonoscopy.  However the information will also be passed on to family members to pursue testing.    Plan: She feels well, but complains of right arm soreness after a fall. She reports that she began experiencing food allergy symptoms on Sunday, but this resolved within 48 hours. She stopped taking Diflucan  during this time and states that the cellulitis of her genitalia improved significantly. She has a WBC of 4.8, a low hemoglobin of 10.4 down from 10.7, and platelet count of 195,000. Her CMP is normal other than an elevated creatinine of 1.20 up from 1.15 and a low total protein of 6.3 down from 6.5.  Her magnesium  remains normal at 2.1. She denies overt bleeding. I will add iron, TIBC, and folate to her labs today and will call her with any abnormal results. I reminded her to avoid NSAIDs. She had an EGD on 04/18/2017 which revealed reflux esophagitis. She had a colonoscopy on 04/18/2017 which revealed diverticulosis and a polyp in the sigmoid colon. Small bowel biopsy was negative. It was recommended that she have this repeated in 5 years, but she would not like to.  I will see her back in 3 months with CBC, CMP, and magnesium . She will see Dr. Viktoria in January and continue to see her every 3 months. I will schedule her for a bilateral screening mammogram at [facility] for February 2026. The patient understands the plans discussed today and is in agreement with them.  She knows to contact our office if she develops concerns prior to her next appointment.   I provided 23 minutes of face-to-face time during this encounter and > 50% was spent counseling as documented under my assessment and plan.    Doris VEAR Cornish, MD  [name] [place] [address] Dept: [phone] Dept Fax: [phone]    No orders of the defined types were placed in this encounter.  CHIEF COMPLAINT:  CC: Stage IIIC vulvar cancer  Current Treatment: Observation  HISTORY OF PRESENT ILLNESS:   Oncology History  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)  06/08/2021 Initial Diagnosis   Breast cancer in female [facility])   07/23/2021 Genetic Testing   Negative hereditary cancer genetic testing: no pathogenic variants detected in [employer] BRCAPlus Panel. Report date is July 23, 2021.  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  The report date is Jul 26, 2021.  The BRCAplus panel offered by [employer] and includes sequencing and deletion/duplication analysis for the following 8 genes: ATM, BRCA1, BRCA2, CDH1, CHEK2, PALB2, PTEN, and TP53.  Results of pan-cancer panel pending.   The CancerNext-Expanded gene panel offered by  [employer] and includes sequencing and rearrangement analysis for the following 77 genes: AIP, ALK, APC*, ATM*, AXIN2, BAP1, BARD1, BLM, BMPR1A, BRCA1*, BRCA2*, BRIP1*, CDC73, CDH1*, CDK4, CDKN1B, CDKN2A, CHEK2*, CTNNA1, DICER1, FANCC, FH, FLCN, GALNT12, KIF1B, LZTR1, MAX, MEN1, MET, MLH1*, MSH2*, MSH3, MSH6*, MUTYH*, NBN, NF1*, NF2, NTHL1, PALB2*, PHOX2B, PMS2*, POT1, PRKAR1A, PTCH1, PTEN*, RAD51C*, RAD51D*, RB1, RECQL, RET, SDHA, SDHAF2, SDHB, SDHC, SDHD, SMAD4, SMARCA4, SMARCB1, SMARCE1, STK11, SUFU, TMEM127, TP53*, TSC1, TSC2, VHL and XRCC2 (sequencing and deletion/duplication); EGFR, EGLN1, HOXB13, KIT, MITF, PDGFRA, POLD1, and POLE (sequencing only); EPCAM and GREM1 (deletion/duplication only). DNA and RNA analyses performed for * genes.    08/23/2021 Cancer Staging   Staging form: Breast, AJCC 8th Edition - Pathologic stage from  08/23/2021: Stage IA (pT2(2), pN0(sn), cM0, G1, ER+, PR+, HER2-) - Signed by Cornelius Doris DEL, MD on 09/29/2021 Histopathologic type: Lobular carcinoma, NOS Stage prefix: Initial diagnosis Method of lymph node assessment: Sentinel lymph node biopsy Nuclear grade: G1 Multigene prognostic tests performed: EndoPredict Histologic grading system: 3 grade system Residual tumor (R): R0 - None Laterality: Right Tumor size (mm): 24 Multiple tumors: Yes Number of tumors: 2 Lymph-vascular invasion (LVI): LVI not present (absent)/not identified Diagnostic confirmation: Positive histology PLUS positive immunophenotyping and/or positive genetic studies Specimen type: Excision Staged by: Managing physician Menopausal status: Postmenopausal Ki-67 (%): 5 Stage used in treatment planning: Yes National guidelines used in treatment planning: Yes Type of national guideline used in treatment planning: NCCN   Vulvar cancer (HCC)  06/28/2023 Initial Diagnosis   Vulvar cancer (HCC)   06/28/2023 Cancer Staging   Staging form: Vulva, AJCC V9 - Clinical stage from 06/28/2023: FIGO Stage IIIC (cT1b, cN1c, cM0) - Signed by Cornelius Doris DEL, MD on 07/31/2023 Histopathologic type: Squamous cell carcinoma, NOS Stage prefix: Initial diagnosis Method of lymph node assessment: Lymph node dissection Histologic grade (G): G2 Histologic grading system: 3 grade system Tumor size (mm): 30 Lymph-vascular invasion (LVI): LVI present/identified, NOS Diagnostic confirmation: Positive histology Specimen type: Excision Staged by: Managing physician Femoral-inguinal nodal status: Positive Solitary (s) or multifocal (m) tumors in the primary site: Solitary Perineural invasion (PNI): Unknown Stage used in treatment planning: Yes National guidelines used in treatment planning: Yes Type of national guideline used in treatment planning: NCCN   09/04/2023 - 09/18/2023 Chemotherapy   Patient is on Treatment Plan : CERVICAL  Pembrolizumab  q21d + XRT (cisplatin  d/c'd 09/25/23)     12/22/2023 - 12/22/2023 Chemotherapy   Patient is on Treatment Plan : CERVICAL Pembrolizumab  (200) q21d         INTERVAL HISTORY:  Doris Reyes is here today for repeat clinical assessment of her stage IIA breast cancer and stage IIIC vulvar cancer. Patient states that she feels well, but complains of right arm soreness after a fall. She reports that she began experiencing food allergy symptoms on Sunday, but this resolved within 48 hours. She stopped taking Diflucan  during this time and states that the cellulitis of her genitalia improved significantly. She has a WBC of 4.8, a low hemoglobin of 10.4 down from 10.7, and platelet count of 195,000. Her CMP is normal other than an elevated creatinine of 1.20 up from 1.15, a low total protein of 6.3 down from 6.5, and a low eGFR of 48 down from 50. Her magnesium  remains normal at 2.1. She denies overt bleeding. I will add iron, TIBC, and folate to her labs today and will call her with any abnormal results. I reminded her to avoid NSAIDs. She had an EGD on  04/18/2017 which revealed reflux esophagitis. She had a colonoscopy on 04/18/2017 which revealed diverticulosis and a polyp in the sigmoid colon. Small bowel biopsy was negative. It was recommended that she have this repeated in 5 years, but she would not like to.  She  will be 73 years old by then so it will be up to her.  I will see her back in 3 months with CBC, CMP, and magnesium . She will see Dr. Viktoria in January and continue to see her every 3 months. I will schedule her for a bilateral screening mammogram at [facility] for February 2026.  She denies fever, chills, night sweats, or other signs of infection. She denies cardiorespiratory and gastrointestinal issues. Her appetite is good and Her weight has increased 3 pounds over last 4 weeks.  REVIEW OF SYSTEMS:   Review of Systems  Constitutional:  Positive for fatigue. Negative for appetite change,  chills, diaphoresis, fever and unexpected weight change.  HENT:   Negative for lump/mass, mouth sores, sore throat and trouble swallowing.   Respiratory:  Negative for cough, hemoptysis and shortness of breath.        Uses CPAP  Cardiovascular:  Negative for chest pain and leg swelling.  Gastrointestinal:  Negative for abdominal pain, blood in stool, constipation, diarrhea, nausea and vomiting.  Genitourinary:  Positive for pelvic pain (occasional). Negative for difficulty urinating, dysuria, frequency, hematuria and vaginal bleeding.        Recurrent vulvar infections  Musculoskeletal:  Negative for arthralgias, back pain, gait problem and myalgias.  Skin:  Negative for rash.  Neurological:  Negative for dizziness, extremity weakness, gait problem, headaches and light-headedness.  Hematological:  Negative for adenopathy. Does not bruise/bleed easily.  Psychiatric/Behavioral:  Negative for depression and sleep disturbance. The patient is not nervous/anxious.     VITALS:   Blood pressure 136/68, pulse 67, temperature 98.1 F (36.7 C), temperature source Oral, resp. rate 16, height 5' 3 (1.6 m), weight 213 lb 11.2 oz (96.9 kg), SpO2 97%.  Wt Readings from Last 3 Encounters:  02/29/24 213 lb 11.2 oz (96.9 kg)  02/01/24 210 lb 14.4 oz (95.7 kg)  01/29/24 211 lb (95.7 kg)    Body mass index is 37.86 kg/m.  Performance status (ECOG): 1 - Symptomatic but completely ambulatory  PHYSICAL EXAM:   Physical Exam Vitals and nursing note reviewed.  Constitutional:      General: She is not in acute distress.    Appearance: Normal appearance. She is not ill-appearing.  HENT:     Head: Normocephalic and atraumatic.     Mouth/Throat:     Mouth: Mucous membranes are moist.     Pharynx: Oropharynx is clear. No oropharyngeal exudate or posterior oropharyngeal erythema.  Eyes:     General: No scleral icterus.    Extraocular Movements: Extraocular movements intact.     Conjunctiva/sclera:  Conjunctivae normal.     Pupils: Pupils are equal, round, and reactive to light.  Cardiovascular:     Rate and Rhythm: Normal rate and regular rhythm.     Heart sounds: Normal heart sounds. No murmur heard.    No friction rub. No gallop.  Pulmonary:     Effort: Pulmonary effort is normal.     Breath sounds: Normal breath sounds. No wheezing, rhonchi or rales.  Abdominal:     General: There is no distension.     Palpations: Abdomen is soft. There is no hepatomegaly, splenomegaly or mass.     Tenderness: There is no abdominal tenderness.  Musculoskeletal:        General: Normal range of motion.     Cervical back: Normal range of motion and neck supple. No tenderness.     Right lower leg: No edema.     Left lower leg: No edema.  Lymphadenopathy:     Cervical: No cervical adenopathy.     Upper Body:     Right upper body: No supraclavicular or axillary adenopathy.     Left upper body: No supraclavicular or axillary adenopathy.     Lower Body: No right inguinal adenopathy. No left inguinal adenopathy.  Skin:    General: Skin is warm and dry.     Coloration: Skin is not jaundiced.     Findings: No rash.     Comments: Her skin of the genitalia is clear now.  Neurological:     Mental Status: She is alert and oriented to person, place, and time.     Cranial Nerves: No cranial nerve deficit.  Psychiatric:        Mood and Affect: Mood normal.        Behavior: Behavior normal.        Thought Content: Thought content normal.    LABS:      Latest Ref Rng & Units 02/29/2024    1:31 PM 02/01/2024   12:59 PM 01/04/2024   10:13 AM  CBC  WBC 4.0 - 10.5 K/uL 4.8  5.1  4.0   Hemoglobin 12.0 - 15.0 g/dL 89.5  89.2  89.4   Hematocrit 36.0 - 46.0 % 31.8  32.5  32.3   Platelets 150 - 400 K/uL 195  184  187       Latest Ref Rng & Units 02/29/2024    1:31 PM 02/01/2024   12:59 PM 01/04/2024   10:13 AM  CMP  Glucose 70 - 99 mg/dL 899  897  890   BUN 8 - 23 mg/dL 20  19  25    Creatinine 0.44 -  1.00 mg/dL 8.79  8.84  8.86   Sodium 135 - 145 mmol/L 139  139  140   Potassium 3.5 - 5.1 mmol/L 3.8  4.5  4.5   Chloride 98 - 111 mmol/L 104  102  105   CO2 22 - 32 mmol/L 25  26  25    Calcium  8.9 - 10.3 mg/dL 9.2  9.4  9.3   Total Protein 6.5 - 8.1 g/dL 6.3  6.5  6.3   Total Bilirubin 0.0 - 1.2 mg/dL 0.2  <9.7  0.3   Alkaline Phos 38 - 126 U/L 74  74  75   AST 15 - 41 U/L 23  24  19    ALT 0 - 44 U/L 20  18  15      Lab Results  Component Value Date   TIBC 498 (H) 02/29/2024   TIBC 441 10/11/2023   FERRITIN 20 02/29/2024   FERRITIN 65 10/11/2023   IRONPCTSAT 12 02/29/2024   IRONPCTSAT 14 10/11/2023    STUDIES:   No results found.    HISTORY:   Past Medical History:  Diagnosis Date   Anemia    Anxiety    Cancer (HCC)    right breast ILC   Complication of anesthesia    difficulty waking up   Depression    Diverticulitis    Family history of breast cancer    Family history of ovarian cancer    Family history of prostate cancer    Fibromyalgia  GERD (gastroesophageal reflux disease)    Heart murmur    History of colonic polyps    History of hiatal hernia    Hyperlipidemia    Hypertension    IBS (irritable bowel syndrome)    Ischemic colitis 03/2012   Osteoporosis    Pre-diabetes    RLS (restless legs syndrome)    Sinusitis    Sleep apnea    CPAP nightly    Past Surgical History:  Procedure Laterality Date   BREAST LUMPECTOMY WITH RADIOACTIVE SEED LOCALIZATION Right 08/04/2021   Procedure: RIGHT BREAST LUMPECTOMY WITH RADIOACTIVE SEED LOCALIZATION;  Surgeon: Vanderbilt Ned, MD;  Location: [facility];  Service: General;  Laterality: Right;   CHOLECYSTECTOMY  2004   COLONOSCOPY WITH PROPOFOL  N/A 04/18/2017   Procedure: COLONOSCOPY WITH PROPOFOL ;  Surgeon: Kristie Lamprey, MD;  Location: [facility];  Service: Endoscopy;  Laterality: N/A;   ESOPHAGOGASTRODUODENOSCOPY (EGD) WITH PROPOFOL  N/A 04/18/2017   Procedure: ESOPHAGOGASTRODUODENOSCOPY (EGD) WITH PROPOFOL ;   Surgeon: Kristie Lamprey, MD;  Location: [facility];  Service: Endoscopy;  Laterality: N/A;   EXAM UNDER ANESTHESIA, PELVIC N/A 06/28/2023   Procedure: EXAM UNDER ANESTHESIA, PELVIC;  Surgeon: Viktoria Comer SAUNDERS, MD;  Location: [facility];  Service: Gynecology;  Laterality: N/A;   FLEXIBLE SIGMOIDOSCOPY  04/20/2012   Procedure: FLEXIBLE SIGMOIDOSCOPY;  Surgeon: Belvie JONETTA Just, MD;  Location: [facility];  Service: Endoscopy;  Laterality: N/A;   INGUINAL LYMPHADENECTOMY N/A 06/28/2023   Procedure: LYMPHADENECTOMY, INGUINAL, OPEN;  Surgeon: Viktoria Comer SAUNDERS, MD;  Location: [facility];  Service: Gynecology;  Laterality: N/A;   IR IMAGING GUIDED PORT INSERTION  07/31/2023   NISSEN FUNDOPLICATION  2011   PARAESOPHAGEAL HERNIA REPAIR  09/11/2009   and Nissen fundoplication   RADICAL VULVECTOMY N/A 06/28/2023   Procedure: VULVECTOMY, RADICAL;  Surgeon: Viktoria Comer SAUNDERS, MD;  Location: [facility];  Service: Gynecology;  Laterality: N/A;  Possible skin flap   RE-EXCISION OF BREAST LUMPECTOMY Right 08/18/2021   Procedure: RE-EXCISION RIGHT BREAST LUMPECTOMY;  Surgeon: Vanderbilt Ned, MD;  Location: [facility];  Service: General;  Laterality: Right;   SENTINEL NODE BIOPSY N/A 08/04/2021   Procedure: SENTINEL NODE BIOPSY;  Surgeon: Vanderbilt Ned, MD;  Location: [facility];  Service: General;  Laterality: N/A;   TONSILLECTOMY  1960's   TOTAL ABDOMINAL HYSTERECTOMY  2001   w/ BSO    Family History  Problem Relation Age of Onset   Colon cancer Mother 75   Alzheimer's disease Mother    Heart attack Father    Aneurysm Paternal Grandmother        brain   Stroke Paternal Grandfather    Cancer Maternal Aunt        x2   Alzheimer's disease Maternal Aunt    Ovarian cancer Maternal Aunt    Prostate cancer Maternal Uncle    Kidney disease Maternal Uncle    Atrial fibrillation Maternal Uncle    Stroke Paternal Uncle    Prostate cancer Cousin        paternal first cousin   Prostate cancer Cousin         paternal first cousin   Esophageal cancer Cousin        maternal first cousin   Breast cancer Cousin        DCIS, maternal first cousin   Ovarian cancer Cousin        maternal first cousin   Brain cancer Cousin    Rheum arthritis Other    Lung disease Neg Hx  Social History:  reports that she has never smoked. She has never used smokeless tobacco. She reports that she does not drink alcohol and does not use drugs.The patient is alone today.  Allergies:  Allergies  Allergen Reactions   Cefuroxime Other (See Comments), Diarrhea and Nausea Only    IBS   Sulfa  Antibiotics Nausea And Vomiting and Nausea Only   Tramadol Other (See Comments) and Nausea Only   Ceftin [Cefuroxime Axetil] Diarrhea   Compazine  [Prochlorperazine ] Other (See Comments)    Vision problems   Ondansetron  Hcl    Other Other (See Comments)    Pneumonia vaccine (uncoded)   Tramadol Hcl Nausea And Vomiting   Zofran  [Ondansetron ] Other (See Comments)    Vision changes    Current Medications: Current Outpatient Medications  Medication Sig Dispense Refill   acetaminophen  (TYLENOL ) 650 MG CR tablet 1,300 mg in the morning and at bedtime.     amLODipine  (NORVASC ) 5 MG tablet Take 5 mg by mouth daily.     Ascorbic Acid (VITAMIN C) 500 MG CHEW Chew 500 mg by mouth daily.     aspirin EC 81 MG tablet Take 81 mg by mouth at bedtime. Swallow whole.     atenolol  (TENORMIN ) 50 MG tablet Take 75 mg by mouth daily.      benazepril  (LOTENSIN ) 10 MG tablet Take 10 mg by mouth daily.     benzonatate (TESSALON) 100 MG capsule Take 100-200 mg by mouth at bedtime.     Biotin 1000 MCG tablet Take 1,000 mcg by mouth daily.     Calcium  Citrate-Vitamin D (CITRACAL + D PO) Take 1 tablet by mouth daily.     cetirizine (ZYRTEC) 10 MG tablet Take 10 mg by mouth at bedtime.     chlorpheniramine-HYDROcodone (TUSSIONEX) 10-8 MG/5ML Take 5 mLs by mouth every 12 (twelve) hours as needed for cough. 90 mL 0   Cholecalciferol (VITAMIN D)  50 MCG (2000 UT) tablet Take 2,000 Units by mouth daily.     cyanocobalamin  1000 MCG tablet Take 1,000 mcg by mouth daily.     diclofenac sodium (VOLTAREN) 1 % GEL Apply 1 application  topically daily as needed (for pain).     diphenhydrAMINE  (BENADRYL ) 25 MG tablet Take 25 mg by mouth every 6 (six) hours as needed for allergies.     diphenoxylate-atropine (LOMOTIL) 2.5-0.025 MG tablet Take 2 tablets by mouth 4 (four) times daily as needed for diarrhea or loose stools.     DULoxetine  (CYMBALTA ) 60 MG capsule 2 capsules Orally once a day     EPINEPHrine  (EPIPEN  2-PAK) 0.3 mg/0.3 mL IJ SOAJ injection Inject 0.3 mg into the muscle as needed for anaphylaxis.     famotidine  (PEPCID ) 20 MG tablet Take 20 mg by mouth at bedtime.     fexofenadine (ALLEGRA) 180 MG tablet Take 180 mg by mouth in the morning.     fluconazole  (DIFLUCAN ) 200 MG tablet Take 1 tablet (200 mg total) by mouth daily. 10 tablet 1   fluticasone  (FLONASE ) 50 MCG/ACT nasal spray Place 1 spray into both nostrils daily.     lidocaine  (XYLOCAINE ) 5 % ointment Apply 1 Application topically 2 (two) times daily as needed for moderate pain (pain score 4-6) (to the vulva). Apply to vulva as needed for discomfort 35.44 g 3   lidocaine  4 % Place 1 patch onto the skin daily.     linezolid  (ZYVOX ) 600 MG tablet Take 1 tablet (600 mg total) by mouth 2 (two) times daily. 14  tablet 0   NON FORMULARY Pt uses a c-pap nightly     Nutritional Supplements (JUICE PLUS FIBRE PO) Take 2 each by mouth daily. Fruits and Vegetables     nystatin  (MYCOSTATIN /NYSTOP ) powder Apply 1 Application topically 3 (three) times daily. 30 g 5   Omega-3 Fatty Acids (FISH OIL) 1000 MG CAPS Take 1,000 mg by mouth daily.      pantoprazole  (PROTONIX ) 40 MG tablet Take 40 mg by mouth every evening.     Phenylephrine -APAP-Guaifenesin (TYLENOL  SINUS SEVERE PO) Take 2 tablets by mouth daily as needed (drainage).     polyvinyl alcohol (LIQUIFILM TEARS) 1.4 % ophthalmic solution  Place 1 drop into both eyes 5 (five) times daily.     Probiotic Product (PROBIOTIC & ACIDOPHILUS EX ST PO) Take 1 capsule by mouth daily. Ultra Flora Plus Capsules     Propylene Glycol (SYSTANE COMPLETE) 0.6 % SOLN Place 1 drop into both eyes in the morning and at bedtime.     rOPINIRole  (REQUIP ) 0.5 MG tablet Take 0.5 mg by mouth at bedtime.     senna-docusate (SENOKOT-S) 8.6-50 MG tablet Take 2 tablets by mouth at bedtime. For AFTER surgery, do not take if having diarrhea 30 tablet 0   simvastatin  (ZOCOR ) 10 MG tablet Take 10 mg by mouth at bedtime.       sucralfate  (CARAFATE ) 1 GM/10ML suspension Take 1 g by mouth 2 (two) times daily.     traZODone  (DESYREL ) 50 MG tablet Take 50 mg by mouth at bedtime.     triamcinolone  (KENALOG ) 0.025 % ointment Apply 1 Application topically 2 (two) times daily. 30 g 0   vitamin E 400 UNIT capsule Take 400 Units by mouth daily.       White Petrolatum -Mineral Oil (REFRESH P.M. OP) Place 1 Application into both eyes at bedtime.     No current facility-administered medications for this visit.   I,Vidhi Delellis H Keron Koffman,acting as a scribe for Doris VEAR Cornish, MD.,have documented all relevant documentation on the behalf of Doris VEAR Cornish, MD,as directed by  Doris VEAR Cornish, MD while in the presence of Doris VEAR Cornish, MD.  I have reviewed this report as typed by the medical scribe, and it is complete and accurate.

## 2024-03-01 DIAGNOSIS — Z889 Allergy status to unspecified drugs, medicaments and biological substances status: Secondary | ICD-10-CM | POA: Diagnosis not present

## 2024-03-01 DIAGNOSIS — E7800 Pure hypercholesterolemia, unspecified: Secondary | ICD-10-CM | POA: Diagnosis not present

## 2024-03-01 DIAGNOSIS — J309 Allergic rhinitis, unspecified: Secondary | ICD-10-CM | POA: Diagnosis not present

## 2024-03-01 DIAGNOSIS — R7303 Prediabetes: Secondary | ICD-10-CM | POA: Diagnosis not present

## 2024-03-01 DIAGNOSIS — I10 Essential (primary) hypertension: Secondary | ICD-10-CM | POA: Diagnosis not present

## 2024-03-01 DIAGNOSIS — F419 Anxiety disorder, unspecified: Secondary | ICD-10-CM | POA: Diagnosis not present

## 2024-03-01 DIAGNOSIS — G259 Extrapyramidal and movement disorder, unspecified: Secondary | ICD-10-CM | POA: Diagnosis not present

## 2024-03-01 DIAGNOSIS — C519 Malignant neoplasm of vulva, unspecified: Secondary | ICD-10-CM | POA: Diagnosis not present

## 2024-03-01 DIAGNOSIS — F5101 Primary insomnia: Secondary | ICD-10-CM | POA: Diagnosis not present

## 2024-03-01 DIAGNOSIS — D649 Anemia, unspecified: Secondary | ICD-10-CM | POA: Diagnosis not present

## 2024-03-01 DIAGNOSIS — R053 Chronic cough: Secondary | ICD-10-CM | POA: Diagnosis not present

## 2024-03-01 DIAGNOSIS — K219 Gastro-esophageal reflux disease without esophagitis: Secondary | ICD-10-CM | POA: Diagnosis not present

## 2024-03-08 ENCOUNTER — Encounter: Payer: Self-pay | Admitting: Hematology and Oncology

## 2024-03-11 ENCOUNTER — Telehealth: Payer: Self-pay

## 2024-03-11 NOTE — Telephone Encounter (Signed)
-----   Message from Wanda Cornish, MD sent at 03/08/2024  7:38 AM EST ----- Regarding: call Her folate is good but her iron level is drifting down, now low normal.  Is she able to tolerate oral iron?  If so, would rec 1 every other day

## 2024-03-11 NOTE — Telephone Encounter (Signed)
Attempted to contact patient. No answer at this time. 

## 2024-03-13 ENCOUNTER — Encounter: Payer: Self-pay | Admitting: Hematology and Oncology

## 2024-03-13 ENCOUNTER — Telehealth: Payer: Self-pay

## 2024-03-13 ENCOUNTER — Other Ambulatory Visit: Payer: Self-pay

## 2024-03-13 ENCOUNTER — Encounter: Payer: Self-pay | Admitting: Internal Medicine

## 2024-03-13 ENCOUNTER — Other Ambulatory Visit (HOSPITAL_BASED_OUTPATIENT_CLINIC_OR_DEPARTMENT_OTHER): Payer: Self-pay

## 2024-03-13 MED ORDER — LINEZOLID 600 MG PO TABS: 600.0000 mg | ORAL_TABLET | Freq: Two times a day (BID) | ORAL | 0 refills | Status: DC

## 2024-03-13 MED ORDER — LINEZOLID 600 MG PO TABS
600.0000 mg | ORAL_TABLET | Freq: Two times a day (BID) | ORAL | 0 refills | Status: DC
  Filled 2024-03-13: qty 14, 7d supply, fill #0

## 2024-03-13 NOTE — Telephone Encounter (Signed)
 Doris Reyes is aware of the message and recommendation from Eleanor Epps NP. She will call ID and let them know about the flare up of the cellulitis

## 2024-03-13 NOTE — Telephone Encounter (Signed)
 Called patient back to let her know Linezolid  has been sent to the pharmacy. Patient didn't answer and LVM letting her know RX was sent. Alf Doyle ONEIDA Ligas, CMA

## 2024-03-13 NOTE — Telephone Encounter (Signed)
 Doris Reyes called the office stating Dr.Tucker referred her to infectious disease, (Dr.Singh) She was seen in November. At that time the cellulitis had went away and Dr.Singh told her he didn't think she needed to go back and if she had anymore flare ups she would need to call Dr.Tucker and get the antibiotic Linezolid  (Zyvox ) 600mg . She has been on this before and it did not cause GI upset as most abx do.   Yesterday she noticed the cellulitis is back on the left side groin and this morning she noticed it at the panty line and going down left thigh. Reports she is in a lot of discomfort.   Pt aware Dr. Viktoria is in the OR today, message will be sent and we will call her with advice/recommendations

## 2024-03-13 NOTE — Telephone Encounter (Signed)
 No 30 min availably with our office this week for her flare up and reading the GYN note they directed her back to our office. RX sent for Linezolid .

## 2024-03-13 NOTE — Telephone Encounter (Signed)
 Patient is calling requesting an prescription for linezolid  and is currently having a cellulitis flare up. Patient reports she reached out to GYN first as you requested and Dr. Viktoria is not willing to fill this medication. Please advise and [facility] [place]

## 2024-03-13 NOTE — Telephone Encounter (Signed)
 Has she seen a provider for cellulitis?

## 2024-03-14 ENCOUNTER — Inpatient Hospital Stay: Admitting: Hematology and Oncology

## 2024-03-14 ENCOUNTER — Inpatient Hospital Stay

## 2024-03-14 ENCOUNTER — Other Ambulatory Visit: Payer: Self-pay | Admitting: Hematology and Oncology

## 2024-03-14 ENCOUNTER — Other Ambulatory Visit (HOSPITAL_BASED_OUTPATIENT_CLINIC_OR_DEPARTMENT_OTHER): Payer: Self-pay

## 2024-03-14 ENCOUNTER — Other Ambulatory Visit: Payer: Self-pay

## 2024-03-14 ENCOUNTER — Telehealth: Payer: Self-pay

## 2024-03-14 VITALS — BP 120/60 | HR 65 | Temp 98.1°F | Resp 18 | Ht 63.0 in

## 2024-03-14 DIAGNOSIS — L03311 Cellulitis of abdominal wall: Secondary | ICD-10-CM | POA: Diagnosis not present

## 2024-03-14 DIAGNOSIS — C519 Malignant neoplasm of vulva, unspecified: Secondary | ICD-10-CM | POA: Diagnosis not present

## 2024-03-14 DIAGNOSIS — E860 Dehydration: Secondary | ICD-10-CM

## 2024-03-14 LAB — CBC WITH DIFFERENTIAL (CANCER CENTER ONLY)
Abs Immature Granulocytes: 0.05 K/uL (ref 0.00–0.07)
Basophils Absolute: 0 K/uL (ref 0.0–0.1)
Basophils Relative: 0 %
Eosinophils Absolute: 0 K/uL (ref 0.0–0.5)
Eosinophils Relative: 0 %
HCT: 30.7 % — ABNORMAL LOW (ref 36.0–46.0)
Hemoglobin: 9.9 g/dL — ABNORMAL LOW (ref 12.0–15.0)
Immature Granulocytes: 0 %
Lymphocytes Relative: 7 %
Lymphs Abs: 0.9 K/uL (ref 0.7–4.0)
MCH: 26.1 pg (ref 26.0–34.0)
MCHC: 32.2 g/dL (ref 30.0–36.0)
MCV: 80.8 fL (ref 80.0–100.0)
Monocytes Absolute: 0.7 K/uL (ref 0.1–1.0)
Monocytes Relative: 6 %
Neutro Abs: 10.1 K/uL — ABNORMAL HIGH (ref 1.7–7.7)
Neutrophils Relative %: 87 %
Platelet Count: 188 K/uL (ref 150–400)
RBC: 3.8 MIL/uL — ABNORMAL LOW (ref 3.87–5.11)
RDW: 15.7 % — ABNORMAL HIGH (ref 11.5–15.5)
WBC Count: 11.7 K/uL — ABNORMAL HIGH (ref 4.0–10.5)
nRBC: 0 % (ref 0.0–0.2)

## 2024-03-14 LAB — CMP (CANCER CENTER ONLY)
ALT: 22 U/L (ref 0–44)
AST: 21 U/L (ref 15–41)
Albumin: 3.9 g/dL (ref 3.5–5.0)
Alkaline Phosphatase: 87 U/L (ref 38–126)
Anion gap: 13 (ref 5–15)
BUN: 21 mg/dL (ref 8–23)
CO2: 24 mmol/L (ref 22–32)
Calcium: 9.1 mg/dL (ref 8.9–10.3)
Chloride: 96 mmol/L — ABNORMAL LOW (ref 98–111)
Creatinine: 1.28 mg/dL — ABNORMAL HIGH (ref 0.44–1.00)
GFR, Estimated: 44 mL/min — ABNORMAL LOW (ref >60)
Glucose, Bld: 126 mg/dL — ABNORMAL HIGH (ref 70–99)
Potassium: 3.6 mmol/L (ref 3.5–5.1)
Sodium: 133 mmol/L — ABNORMAL LOW (ref 135–145)
Total Bilirubin: 0.3 mg/dL (ref 0.0–1.2)
Total Protein: 6.4 g/dL — ABNORMAL LOW (ref 6.5–8.1)

## 2024-03-14 LAB — MAGNESIUM: Magnesium: 1.8 mg/dL (ref 1.7–2.4)

## 2024-03-14 MED ORDER — GABAPENTIN 300 MG PO CAPS
300.0000 mg | ORAL_CAPSULE | Freq: Every day | ORAL | 1 refills | Status: AC
  Filled 2024-03-14: qty 60, 60d supply, fill #0

## 2024-03-14 MED ORDER — SODIUM CHLORIDE 0.9 % IV SOLN: INTRAVENOUS | Status: DC

## 2024-03-14 NOTE — Patient Instructions (Signed)
 Dehydration, Adult Dehydration is a condition in which there is not enough water or other fluids in the body. This happens when a person loses more fluids than they take in. Important organs cannot work right without the right amount of fluids. Any loss of fluids from the body can cause dehydration. Dehydration can be mild, worse, or very bad. It should be treated right away to keep it from getting very bad. What are the causes? Conditions that cause loss of water in the body. They include: Watery poop (diarrhea). Vomiting. Sweating a lot. Fever. Infection. Peeing (urinating) a lot. Not drinking enough fluids. Certain medicines, such as medicines that take extra fluid out of the body (diuretics). Lack of safe drinking water. Not being able to get enough water and food. What increases the risk? Having a long-term (chronic) illness that has not been treated the right way, such as: Diabetes. Heart disease. Kidney disease. Being 25 years of age or older. Having a disability. Living in a place that is high above the ground or sea (high in altitude). The thinner, drier air causes more fluid loss. Doing exercises that put stress on your body for a long time. Being active when in hot places. What are the signs or symptoms? Symptoms of dehydration depend on how bad it is. Mild or worse dehydration Thirst. Dry lips or dry mouth. Feeling dizzy or light-headed. Muscle cramps. Passing little pee or dark pee. Pee may be the color of tea. Headache. Very bad dehydration Changes in skin. Skin may: Be cold to the touch (clammy). Be blotchy or pale. Not go back to normal right after you pinch it and let it go. Little or no tears, pee, or sweat. Fast breathing. Low blood pressure. Weak pulse. Pulse that is more than 100 beats a minute when you are sitting still. Other changes, such as: Feeling very thirsty. Eyes that look hollow (sunken). Cold hands and feet. Being confused. Being very  tired (lethargic) or having trouble waking from sleep. Losing weight. Loss of consciousness. How is this treated? Treatment for this condition depends on how bad your dehydration is. Treatment should start right away. Do not wait until your condition gets very bad. Very bad dehydration is an emergency. You will need to go to a hospital. Mild or worse dehydration can be treated at home. You may be asked to: Drink more fluids. Drink an oral rehydration solution (ORS). This drink gives you the right amount of fluids, salts, and minerals (electrolytes). Very bad dehydration can be treated: With fluids through an IV tube. By correcting low levels of electrolytes in the body. By treating the problem that caused your dehydration. Follow these instructions at home: Oral rehydration solution If told by your doctor, drink an ORS: Make an ORS. Use instructions on the package. Start by drinking small amounts, about  cup (120 mL) every 5-10 minutes. Slowly drink more until you have had the amount that your doctor said to have.  Eating and drinking  Drink enough clear fluid to keep your pee pale yellow. If you were told to drink an ORS, finish the ORS first. Then, start slowly drinking other clear fluids. Drink fluids such as: Water. Do not drink only water. Doing that can make the salt (sodium) level in your body get too low. Water from ice chips you suck on. Fruit juice that you have added water to (diluted). Low-calorie sports drinks. Eat foods that have the right amounts of salts and minerals, such as bananas, oranges, potatoes,  tomatoes, or spinach. Do not drink alcohol. Avoid drinks that have caffeine or sugar. These include:: High-calorie sports drinks. Fruit juice that you did not add water to. Soda. Coffee or energy drinks. Avoid foods that are greasy or have a lot of fat or sugar. General instructions Take over-the-counter and prescription medicines only as told by your doctor. Do  not take sodium tablets. Doing that can make the salt level in your body get too high. Return to your normal activities as told by your doctor. Ask your doctor what activities are safe for you. Keep all follow-up visits. Your doctor may check and change your treatment. Contact a doctor if: You have pain in your belly (abdomen) and the pain: Gets worse. Stays in one place. You have a rash. You have a stiff neck. You get angry or annoyed more easily than normal. You are more tired or have a harder time waking than normal. You feel weak or dizzy. You feel very thirsty. Get help right away if: You have any symptoms of very bad dehydration. You vomit every time you eat or drink. Your vomiting gets worse, does not go away, or you vomit blood or green stuff. You are getting treatment, but symptoms are getting worse. You have a fever. You have a very bad headache. You have: Diarrhea that gets worse or does not go away. Blood in your poop (stool). This may cause poop to look black and tarry. No pee in 6-8 hours. Only a small amount of pee in 6-8 hours, and the pee is very dark. You have trouble breathing. These symptoms may be an emergency. Get help right away. Call 911. Do not wait to see if the symptoms will go away. Do not drive yourself to the hospital. This information is not intended to replace advice given to you by your health care provider. Make sure you discuss any questions you have with your health care provider. Document Revised: 10/11/2021 Document Reviewed: 10/11/2021 Elsevier Patient Education  2024 [employer].

## 2024-03-14 NOTE — Progress Notes (Signed)
 [facility]  [address] [place],  [place]  [address] [phone]  Clinic Day:  03/14/2024  Referring physician: [name], MD  ASSESSMENT & PLAN:   Assessment & Plan: Cellulitis Recurrent cellulitis of the lower abdomen.  She started treatment with linezolid  yesterday. Significant excoriation noted to the left inner thigh extending to the buttocks. Her daughter is in attendance today and states Anamaria was a little loopy yesterday evening. Both the patient and the daughter note that she is improved today. She presents hypotensive. We will plan for IVF today. I reassured her that she is on the correct plan for her cellulitis and that the Infectious Disease doctor has put this plan in place for recurrence. She notes pain that is worse at night. I will send in gabapentin  and instructed her to take one at bedtime and if this is not effective, she can increase her dose to two at night for a total of 600 mg. We will have her return next week as needed for follow up and additional IVF if needed.     The patient understands the plans discussed today and is in agreement with them.  She knows to contact our office if she develops concerns prior to her next appointment.   I provided 30 minutes of face-to-face time during this encounter and > 50% was spent counseling as documented under my assessment and plan.    [name], NP  [facility] OF [facility] [address] [place] [address] Dept: [phone] Dept Fax: [phone]   No orders of the defined types were placed in this encounter.     CHIEF COMPLAINT:  CC: Recurrent cellulitis  Current Treatment: Linezolid   HISTORY OF PRESENT ILLNESS:   Oncology History  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)  06/08/2021 Initial Diagnosis   Breast cancer in female [facility])   07/23/2021 Genetic Testing   Negative hereditary  cancer genetic testing: no pathogenic variants detected in [facility] BRCAPlus Panel. Report date is July 23, 2021.  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  The report date is Jul 26, 2021.  The BRCAplus panel offered by [employer] and includes sequencing and deletion/duplication analysis for the following 8 genes: ATM, BRCA1, BRCA2, CDH1, CHEK2, PALB2, PTEN, and TP53.  Results of pan-cancer panel pending.   The CancerNext-Expanded gene panel offered by [facility] and includes sequencing and rearrangement analysis for the following 77 genes: AIP, ALK, APC*, ATM*, AXIN2, BAP1, BARD1, BLM, BMPR1A, BRCA1*, BRCA2*, BRIP1*, CDC73, CDH1*, CDK4, CDKN1B, CDKN2A, CHEK2*, CTNNA1, DICER1, FANCC, FH, FLCN, GALNT12, KIF1B, LZTR1, MAX, MEN1, MET, MLH1*, MSH2*, MSH3, MSH6*, MUTYH*, NBN, NF1*, NF2, NTHL1, PALB2*, PHOX2B, PMS2*, POT1, PRKAR1A, PTCH1, PTEN*, RAD51C*, RAD51D*, RB1, RECQL, RET, SDHA, SDHAF2, SDHB, SDHC, SDHD, SMAD4, SMARCA4, SMARCB1, SMARCE1, STK11, SUFU, TMEM127, TP53*, TSC1, TSC2, VHL and XRCC2 (sequencing and deletion/duplication); EGFR, EGLN1, HOXB13, KIT, MITF, PDGFRA, POLD1, and POLE (sequencing only); EPCAM and GREM1 (deletion/duplication only). DNA and RNA analyses performed for * genes.    08/23/2021 Cancer Staging   Staging form: Breast, AJCC 8th Edition - Pathologic stage from 08/23/2021: Stage IA (pT2(2), pN0(sn), cM0, G1, ER+, PR+, HER2-) - Signed by Cornelius Wanda VEAR, MD on 09/29/2021 Histopathologic type: Lobular carcinoma, NOS Stage prefix: Initial diagnosis Method of lymph node assessment: Sentinel lymph node biopsy Nuclear  grade: G1 Multigene prognostic tests performed: EndoPredict Histologic grading system: 3 grade system Residual tumor (R): R0 - None Laterality: Right Tumor size (mm): 24 Multiple tumors: Yes Number of tumors:  2 Lymph-vascular invasion (LVI): LVI not present (absent)/not identified Diagnostic confirmation: Positive histology PLUS positive immunophenotyping and/or positive genetic studies Specimen type: Excision Staged by: Managing physician Menopausal status: Postmenopausal Ki-67 (%): 5 Stage used in treatment planning: Yes National guidelines used in treatment planning: Yes Type of national guideline used in treatment planning: NCCN   Vulvar cancer (HCC)  06/28/2023 Initial Diagnosis   Vulvar cancer (HCC)   06/28/2023 Cancer Staging   Staging form: Vulva, AJCC V9 - Clinical stage from 06/28/2023: FIGO Stage IIIC (cT1b, cN1c, cM0) - Signed by Cornelius Wanda DEL, MD on 07/31/2023 Histopathologic type: Squamous cell carcinoma, NOS Stage prefix: Initial diagnosis Method of lymph node assessment: Lymph node dissection Histologic grade (G): G2 Histologic grading system: 3 grade system Tumor size (mm): 30 Lymph-vascular invasion (LVI): LVI present/identified, NOS Diagnostic confirmation: Positive histology Specimen type: Excision Staged by: Managing physician Femoral-inguinal nodal status: Positive Solitary (s) or multifocal (m) tumors in the primary site: Solitary Perineural invasion (PNI): Unknown Stage used in treatment planning: Yes National guidelines used in treatment planning: Yes Type of national guideline used in treatment planning: NCCN   09/04/2023 - 09/18/2023 Chemotherapy   Patient is on Treatment Plan : CERVICAL Pembrolizumab  q21d + XRT (cisplatin  d/c'd 09/25/23)     12/22/2023 - 12/22/2023 Chemotherapy   Patient is on Treatment Plan : CERVICAL Pembrolizumab  (200) q21d         INTERVAL HISTORY:   Halah is added to the schedule today at the request of her daughter as she has had a flare of her vulvar cellulitis.  The patient was started on linezolid  yesterday by Dr. Viktoria on the recommendation of Dr. Dennise in ID.  She denies fevers or chills. She denies pain. Her appetite is  good. Her weight has been stable.  REVIEW OF SYSTEMS:   Review of Systems  Constitutional: Negative.   HENT:  Negative.    Eyes: Negative.   Respiratory: Negative.    Cardiovascular: Negative.   Gastrointestinal: Negative.   Endocrine: Negative.   Genitourinary: Negative.    Musculoskeletal: Negative.   Skin:  Positive for wound.       Cellulitis noted to left inner thigh that extends to the buttocks  Neurological:  Positive for light-headedness.  Hematological: Negative.   Psychiatric/Behavioral: Negative.       VITALS:   There were no vitals taken for this visit.  Wt Readings from Last 3 Encounters:  02/29/24 213 lb 11.2 oz (96.9 kg)  02/01/24 210 lb 14.4 oz (95.7 kg)  01/29/24 211 lb (95.7 kg)    There is no height or weight on file to calculate BMI.  Performance status (ECOG): 1 - Symptomatic but completely ambulatory    PHYSICAL EXAM:   Physical Exam Constitutional:      Appearance: Normal appearance. She is normal weight.  HENT:     Head: Normocephalic.     Mouth/Throat:     Mouth: Mucous membranes are moist.  Cardiovascular:     Rate and Rhythm: Normal rate and regular rhythm.     Pulses: Normal pulses.     Heart sounds: Normal heart sounds.  Pulmonary:     Effort: Pulmonary effort is normal.     Breath sounds: Normal breath sounds.  Abdominal:     General: Bowel sounds are normal.  Musculoskeletal:  General: Normal range of motion.  Skin:    Comments: Severe excoriation noted to the left inner thigh that extends to the buttocks.  Neurological:     General: No focal deficit present.     Mental Status: She is alert and oriented to person, place, and time. Mental status is at baseline.  Psychiatric:        Mood and Affect: Mood normal.        Behavior: Behavior normal.        Thought Content: Thought content normal.        Judgment: Judgment normal.     LABS:      Latest Ref Rng & Units 03/14/2024    1:33 PM 02/29/2024    1:31 PM  02/01/2024   12:59 PM  CBC  WBC 4.0 - 10.5 K/uL 11.7  4.8  5.1   Hemoglobin 12.0 - 15.0 g/dL 9.9  89.5  89.2   Hematocrit 36.0 - 46.0 % 30.7  31.8  32.5   Platelets 150 - 400 K/uL 188  195  184       Latest Ref Rng & Units 03/14/2024    1:33 PM 02/29/2024    1:31 PM 02/01/2024   12:59 PM  CMP  Glucose 70 - 99 mg/dL 873  899  897   BUN 8 - 23 mg/dL 21  20  19    Creatinine 0.44 - 1.00 mg/dL 8.71  8.79  8.84   Sodium 135 - 145 mmol/L 133  139  139   Potassium 3.5 - 5.1 mmol/L 3.6  3.8  4.5   Chloride 98 - 111 mmol/L 96  104  102   CO2 22 - 32 mmol/L 24  25  26    Calcium  8.9 - 10.3 mg/dL 9.1  9.2  9.4   Total Protein 6.5 - 8.1 g/dL 6.4  6.3  6.5   Total Bilirubin 0.0 - 1.2 mg/dL 0.3  0.2  <9.7   Alkaline Phos 38 - 126 U/L 87  74  74   AST 15 - 41 U/L 21  23  24    ALT 0 - 44 U/L 22  20  18       No results found for: CEA1, CEA / No results found for: CEA1, CEA No results found for: PSA No results found for: CAN199 No results found for: CAN125  No results found for: TOTALPROTELP, ALBUMINELP, A1GS, A2GS, BETS, BETA2SER, GAMS, MSPIKE, SPEI Lab Results  Component Value Date   TIBC 498 (H) 02/29/2024   TIBC 441 10/11/2023   FERRITIN 20 02/29/2024   FERRITIN 65 10/11/2023   IRONPCTSAT 12 02/29/2024   IRONPCTSAT 14 10/11/2023   No results found for: LDH  STUDIES:   No results found.    HISTORY:   Past Medical History:  Diagnosis Date   Anemia    Anxiety    Cancer (HCC)    right breast ILC   Complication of anesthesia    difficulty waking up   Depression    Diverticulitis    Family history of breast cancer    Family history of ovarian cancer    Family history of prostate cancer    Fibromyalgia    GERD (gastroesophageal reflux disease)    Heart murmur    History of colonic polyps    History of hiatal hernia    Hyperlipidemia    Hypertension    IBS (irritable bowel syndrome)    Ischemic colitis 03/2012   Osteoporosis  Pre-diabetes    RLS (restless legs syndrome)    Sinusitis    Sleep apnea    CPAP nightly    Past Surgical History:  Procedure Laterality Date   BREAST LUMPECTOMY WITH RADIOACTIVE SEED LOCALIZATION Right 08/04/2021   Procedure: RIGHT BREAST LUMPECTOMY WITH RADIOACTIVE SEED LOCALIZATION;  Surgeon: Vanderbilt Ned, MD;  Location: [facility];  Service: General;  Laterality: Right;   CHOLECYSTECTOMY  2004   COLONOSCOPY WITH PROPOFOL  N/A 04/18/2017   Procedure: COLONOSCOPY WITH PROPOFOL ;  Surgeon: Kristie Lamprey, MD;  Location: [facility];  Service: Endoscopy;  Laterality: N/A;   ESOPHAGOGASTRODUODENOSCOPY (EGD) WITH PROPOFOL  N/A 04/18/2017   Procedure: ESOPHAGOGASTRODUODENOSCOPY (EGD) WITH PROPOFOL ;  Surgeon: Kristie Lamprey, MD;  Location: [facility];  Service: Endoscopy;  Laterality: N/A;   EXAM UNDER ANESTHESIA, PELVIC N/A 06/28/2023   Procedure: EXAM UNDER ANESTHESIA, PELVIC;  Surgeon: Viktoria Comer SAUNDERS, MD;  Location: [facility];  Service: Gynecology;  Laterality: N/A;   FLEXIBLE SIGMOIDOSCOPY  04/20/2012   Procedure: FLEXIBLE SIGMOIDOSCOPY;  Surgeon: Belvie JONETTA Just, MD;  Location: [facility];  Service: Endoscopy;  Laterality: N/A;   INGUINAL LYMPHADENECTOMY N/A 06/28/2023   Procedure: LYMPHADENECTOMY, INGUINAL, OPEN;  Surgeon: Viktoria Comer SAUNDERS, MD;  Location: [facility];  Service: Gynecology;  Laterality: N/A;   IR IMAGING GUIDED PORT INSERTION  07/31/2023   NISSEN FUNDOPLICATION  2011   PARAESOPHAGEAL HERNIA REPAIR  09/11/2009   and Nissen fundoplication   RADICAL VULVECTOMY N/A 06/28/2023   Procedure: VULVECTOMY, RADICAL;  Surgeon: Viktoria Comer SAUNDERS, MD;  Location: [facility];  Service: Gynecology;  Laterality: N/A;  Possible skin flap   RE-EXCISION OF BREAST LUMPECTOMY Right 08/18/2021   Procedure: RE-EXCISION RIGHT BREAST LUMPECTOMY;  Surgeon: Vanderbilt Ned, MD;  Location: [facility];  Service: General;  Laterality: Right;   SENTINEL NODE BIOPSY N/A 08/04/2021   Procedure:  SENTINEL NODE BIOPSY;  Surgeon: Vanderbilt Ned, MD;  Location: [facility];  Service: General;  Laterality: N/A;   TONSILLECTOMY  1960's   TOTAL ABDOMINAL HYSTERECTOMY  2001   w/ BSO    Family History  Problem Relation Age of Onset   Colon cancer Mother 79   Alzheimer's disease Mother    Heart attack Father    Aneurysm Paternal Grandmother        brain   Stroke Paternal Grandfather    Cancer Maternal Aunt        x2   Alzheimer's disease Maternal Aunt    Ovarian cancer Maternal Aunt    Prostate cancer Maternal Uncle    Kidney disease Maternal Uncle    Atrial fibrillation Maternal Uncle    Stroke Paternal Uncle    Prostate cancer Cousin        paternal first cousin   Prostate cancer Cousin        paternal first cousin   Esophageal cancer Cousin        maternal first cousin   Breast cancer Cousin        DCIS, maternal first cousin   Ovarian cancer Cousin        maternal first cousin   Brain cancer Cousin    Rheum arthritis Other    Lung disease Neg Hx     Social History:  reports that she has never smoked. She has never used smokeless tobacco. She reports that she does not drink alcohol and does not use drugs.The patient is accompanied by daughter today.  Allergies: Allergies[1]  Current Medications: Current Outpatient Medications  Medication Sig Dispense Refill  acetaminophen  (TYLENOL ) 650 MG CR tablet 1,300 mg in the morning and at bedtime.     amLODipine  (NORVASC ) 5 MG tablet Take 5 mg by mouth daily.     Ascorbic Acid (VITAMIN C) 500 MG CHEW Chew 500 mg by mouth daily.     aspirin EC 81 MG tablet Take 81 mg by mouth at bedtime. Swallow whole.     atenolol  (TENORMIN ) 50 MG tablet Take 75 mg by mouth daily.      benazepril  (LOTENSIN ) 10 MG tablet Take 10 mg by mouth daily.     benzonatate (TESSALON) 100 MG capsule Take 100-200 mg by mouth at bedtime.     Biotin 1000 MCG tablet Take 1,000 mcg by mouth daily.     Calcium  Citrate-Vitamin D (CITRACAL + D PO) Take 1 tablet  by mouth daily.     cetirizine (ZYRTEC) 10 MG tablet Take 10 mg by mouth at bedtime.     chlorpheniramine-HYDROcodone (TUSSIONEX) 10-8 MG/5ML Take 5 mLs by mouth every 12 (twelve) hours as needed for cough. 90 mL 0   Cholecalciferol (VITAMIN D) 50 MCG (2000 UT) tablet Take 2,000 Units by mouth daily.     cyanocobalamin  1000 MCG tablet Take 1,000 mcg by mouth daily.     diclofenac sodium (VOLTAREN) 1 % GEL Apply 1 application  topically daily as needed (for pain).     diphenhydrAMINE  (BENADRYL ) 25 MG tablet Take 25 mg by mouth every 6 (six) hours as needed for allergies.     diphenoxylate-atropine (LOMOTIL) 2.5-0.025 MG tablet Take 2 tablets by mouth 4 (four) times daily as needed for diarrhea or loose stools.     DULoxetine  (CYMBALTA ) 60 MG capsule 2 capsules Orally once a day     EPINEPHrine  (EPIPEN  2-PAK) 0.3 mg/0.3 mL IJ SOAJ injection Inject 0.3 mg into the muscle as needed for anaphylaxis.     famotidine  (PEPCID ) 20 MG tablet Take 20 mg by mouth at bedtime.     fexofenadine (ALLEGRA) 180 MG tablet Take 180 mg by mouth in the morning.     fluconazole  (DIFLUCAN ) 200 MG tablet Take 1 tablet (200 mg total) by mouth daily. 10 tablet 1   fluticasone  (FLONASE ) 50 MCG/ACT nasal spray Place 1 spray into both nostrils daily.     lidocaine  (XYLOCAINE ) 5 % ointment Apply 1 Application topically 2 (two) times daily as needed for moderate pain (pain score 4-6) (to the vulva). Apply to vulva as needed for discomfort 35.44 g 3   lidocaine  4 % Place 1 patch onto the skin daily.     linezolid  (ZYVOX ) 600 MG tablet Take 1 tablet (600 mg total) by mouth 2 (two) times daily. 14 tablet 0   NON FORMULARY Pt uses a c-pap nightly     Nutritional Supplements (JUICE PLUS FIBRE PO) Take 2 each by mouth daily. Fruits and Vegetables     nystatin  (MYCOSTATIN /NYSTOP ) powder Apply 1 Application topically 3 (three) times daily. 30 g 5   Omega-3 Fatty Acids (FISH OIL) 1000 MG CAPS Take 1,000 mg by mouth daily.       pantoprazole  (PROTONIX ) 40 MG tablet Take 40 mg by mouth every evening.     Phenylephrine -APAP-Guaifenesin (TYLENOL  SINUS SEVERE PO) Take 2 tablets by mouth daily as needed (drainage).     polyvinyl alcohol (LIQUIFILM TEARS) 1.4 % ophthalmic solution Place 1 drop into both eyes 5 (five) times daily.     Probiotic Product (PROBIOTIC & ACIDOPHILUS EX ST PO) Take 1 capsule by mouth daily. Ultra  Flora Plus Capsules     Propylene Glycol (SYSTANE COMPLETE) 0.6 % SOLN Place 1 drop into both eyes in the morning and at bedtime.     rOPINIRole  (REQUIP ) 0.5 MG tablet Take 0.5 mg by mouth at bedtime.     senna-docusate (SENOKOT-S) 8.6-50 MG tablet Take 2 tablets by mouth at bedtime. For AFTER surgery, do not take if having diarrhea 30 tablet 0   simvastatin  (ZOCOR ) 10 MG tablet Take 10 mg by mouth at bedtime.       sucralfate  (CARAFATE ) 1 GM/10ML suspension Take 1 g by mouth 2 (two) times daily.     traZODone  (DESYREL ) 50 MG tablet Take 50 mg by mouth at bedtime.     triamcinolone  (KENALOG ) 0.025 % ointment Apply 1 Application topically 2 (two) times daily. 30 g 0   vitamin E 400 UNIT capsule Take 400 Units by mouth daily.       White Petrolatum -Mineral Oil (REFRESH P.M. OP) Place 1 Application into both eyes at bedtime.     gabapentin  (NEURONTIN ) 300 MG capsule Take 1 capsule (300 mg total) by mouth at bedtime. 60 capsule 1   No current facility-administered medications for this visit.   Facility-Administered Medications Ordered in Other Visits  Medication Dose Route Frequency Provider Last Rate Last Admin   0.9 %  sodium chloride  infusion   Intravenous Continuous Harl Setter A, NP 999 mL/hr at 03/14/24 1439 New Bag at 03/14/24 1439              [1]  Allergies Allergen Reactions   Cefuroxime Other (See Comments), Diarrhea and Nausea Only    IBS   Sulfa  Antibiotics Nausea And Vomiting and Nausea Only   Tramadol Other (See Comments) and Nausea Only   Ceftin [Cefuroxime Axetil] Diarrhea    Compazine  [Prochlorperazine ] Other (See Comments)    Vision problems   Ondansetron  Hcl    Other Other (See Comments)    Pneumonia vaccine (uncoded)   Tramadol Hcl Nausea And Vomiting   Zofran  [Ondansetron ] Other (See Comments)    Vision changes

## 2024-03-14 NOTE — Telephone Encounter (Signed)
 Doris Reyes LVM on nurse line that Dr Dennise told her mom needed to see someone today to take @ look at her cellulitis @ groin (has flared up again).  Dr Dennise is with ID. I see they called Dr Lewie office yesterday, but she was in surgery.ID has told them in past that is she had anymore flare ups she would need to call Dr.Tucker and get the antibiotic Linezolid  (Zyvox ) 600mg  (She has been on this before and it did not cause GI upset as most abx do). Dr Viktoria did send Linezolid  in afternoon. Anyway, the daughter wuld really like her to come in for labs, see provider, and possible IVF.

## 2024-03-14 NOTE — Progress Notes (Deleted)
 [facility]  [address] [place],  [place]  [address] [phone]  Clinic Day:  03/14/2024  Referring physician: [name], MD  ASSESSMENT & PLAN:   Assessment & Plan: No problem-specific Assessment & Plan notes found for this encounter.    The patient understands the plans discussed today and is in agreement with them.  She knows to contact our office if she develops concerns prior to her next appointment.   I provided *** minutes of face-to-face time during this encounter and > 50% was spent counseling as documented under my assessment and plan.    Enzio Buchler A Senay Sistrunk, PA-C   CANCER CENTER [facility] [place] - A DEPT OF [facility] [address] [place] [address] Dept: [phone] Dept Fax: [phone]   No orders of the defined types were placed in this encounter.     CHIEF COMPLAINT:  CC: Recurrent cellulitis  Current Treatment: Linezolid   HISTORY OF PRESENT ILLNESS:   Oncology History  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)  06/08/2021 Initial Diagnosis   Breast cancer in female [facility])   07/23/2021 Genetic Testing   Negative hereditary cancer genetic testing: no pathogenic variants detected in [facility] BRCAPlus Panel. Report date is July 23, 2021.  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  The report date is Jul 26, 2021.  The BRCAplus panel offered by [facility] and includes sequencing and deletion/duplication analysis for the following 8 genes: ATM, BRCA1, BRCA2, CDH1, CHEK2, PALB2, PTEN, and TP53.  Results of pan-cancer panel pending.   The CancerNext-Expanded gene panel offered by [facility] and includes sequencing and rearrangement analysis for the following 77 genes: AIP, ALK, APC*, ATM*, AXIN2, BAP1, BARD1, BLM, BMPR1A, BRCA1*, BRCA2*, BRIP1*,  CDC73, CDH1*, CDK4, CDKN1B, CDKN2A, CHEK2*, CTNNA1, DICER1, FANCC, FH, FLCN, GALNT12, KIF1B, LZTR1, MAX, MEN1, MET, MLH1*, MSH2*, MSH3, MSH6*, MUTYH*, NBN, NF1*, NF2, NTHL1, PALB2*, PHOX2B, PMS2*, POT1, PRKAR1A, PTCH1, PTEN*, RAD51C*, RAD51D*, RB1, RECQL, RET, SDHA, SDHAF2, SDHB, SDHC, SDHD, SMAD4, SMARCA4, SMARCB1, SMARCE1, STK11, SUFU, TMEM127, TP53*, TSC1, TSC2, VHL and XRCC2 (sequencing and deletion/duplication); EGFR, EGLN1, HOXB13, KIT, MITF, PDGFRA, POLD1, and POLE (sequencing only); EPCAM and GREM1 (deletion/duplication only). DNA and RNA analyses performed for * genes.    08/23/2021 Cancer Staging   Staging form: Breast, AJCC 8th Edition - Pathologic stage from 08/23/2021: Stage IA (pT2(2), pN0(sn), cM0, G1, ER+, PR+, HER2-) - Signed by Doris Wanda VEAR, MD on 09/29/2021 Histopathologic type: Lobular carcinoma, NOS Stage prefix: Initial diagnosis Method of lymph node assessment: Sentinel lymph node biopsy Nuclear grade: G1 Multigene prognostic tests performed: EndoPredict Histologic grading system: 3 grade system Residual tumor (R): R0 - None Laterality: Right Tumor size (mm): 24 Multiple tumors: Yes Number of tumors: 2 Lymph-vascular invasion (LVI): LVI not present (absent)/not identified Diagnostic confirmation: Positive histology PLUS positive immunophenotyping and/or positive genetic studies Specimen type: Excision Staged by: Managing physician Menopausal status: Postmenopausal Ki-67 (%): 5 Stage used in treatment planning: Yes National guidelines used in treatment planning: Yes Type of national guideline used in treatment planning: NCCN   Vulvar cancer (HCC)  06/28/2023 Initial Diagnosis   Vulvar cancer (HCC)   06/28/2023 Cancer Staging   Staging form: Vulva, AJCC V9 - Clinical stage from 06/28/2023: FIGO Stage IIIC (cT1b, cN1c, cM0) - Signed by Doris Wanda VEAR, MD on 07/31/2023 Histopathologic type: Squamous cell carcinoma, NOS Stage  prefix: Initial diagnosis Method of  lymph node assessment: Lymph node dissection Histologic grade (G): G2 Histologic grading system: 3 grade system Tumor size (mm): 30 Lymph-vascular invasion (LVI): LVI present/identified, NOS Diagnostic confirmation: Positive histology Specimen type: Excision Staged by: Managing physician Femoral-inguinal nodal status: Positive Solitary (s) or multifocal (m) tumors in the primary site: Solitary Perineural invasion (PNI): Unknown Stage used in treatment planning: Yes National guidelines used in treatment planning: Yes Type of national guideline used in treatment planning: NCCN   09/04/2023 - 09/18/2023 Chemotherapy   Patient is on Treatment Plan : CERVICAL Pembrolizumab  q21d + XRT (cisplatin  d/c'd 09/25/23)     12/22/2023 - 12/22/2023 Chemotherapy   Patient is on Treatment Plan : CERVICAL Pembrolizumab  (200) q21d         INTERVAL HISTORY:   Doris Reyes is added to the schedule today at the request of her daughter as she has had a flare of her vulvar cellulitis.  The patient was started on linezolid  yesterday by Dr. Viktoria on the recommendation of Dr. Dennise in ID.  She denies fevers or chills. She denies pain. Her appetite is good. Her weight {Weight change:10426}.  REVIEW OF SYSTEMS:   Review of Systems - Oncology   VITALS:   There were no vitals taken for this visit.  Wt Readings from Last 3 Encounters:  02/29/24 213 lb 11.2 oz (96.9 kg)  02/01/24 210 lb 14.4 oz (95.7 kg)  01/29/24 211 lb (95.7 kg)    There is no height or weight on file to calculate BMI.  Performance status (ECOG): {CHL ONC [id number]    PHYSICAL EXAM:   Physical Exam  LABS:      Latest Ref Rng & Units 02/29/2024    1:31 PM 02/01/2024   12:59 PM 01/04/2024   10:13 AM  CBC  WBC 4.0 - 10.5 K/uL 4.8  5.1  4.0   Hemoglobin 12.0 - 15.0 g/dL 89.5  89.2  89.4   Hematocrit 36.0 - 46.0 % 31.8  32.5  32.3   Platelets 150 - 400 K/uL 195  184  187       Latest Ref Rng & Units 02/29/2024    1:31 PM 02/01/2024    12:59 PM 01/04/2024   10:13 AM  CMP  Glucose 70 - 99 mg/dL 899  897  890   BUN 8 - 23 mg/dL 20  19  25    Creatinine 0.44 - 1.00 mg/dL 8.79  8.84  8.86   Sodium 135 - 145 mmol/L 139  139  140   Potassium 3.5 - 5.1 mmol/L 3.8  4.5  4.5   Chloride 98 - 111 mmol/L 104  102  105   CO2 22 - 32 mmol/L 25  26  25    Calcium  8.9 - 10.3 mg/dL 9.2  9.4  9.3   Total Protein 6.5 - 8.1 g/dL 6.3  6.5  6.3   Total Bilirubin 0.0 - 1.2 mg/dL 0.2  <9.7  0.3   Alkaline Phos 38 - 126 U/L 74  74  75   AST 15 - 41 U/L 23  24  19    ALT 0 - 44 U/L 20  18  15       No results found for: CEA1, CEA / No results found for: CEA1, CEA No results found for: PSA No results found for: CAN199 No results found for: CAN125  No results found for: TOTALPROTELP, ALBUMINELP, A1GS, A2GS, BETS, BETA2SER, GAMS, MSPIKE, SPEI Lab Results  Component Value Date  TIBC 498 (H) 02/29/2024   TIBC 441 10/11/2023   FERRITIN 20 02/29/2024   FERRITIN 65 10/11/2023   IRONPCTSAT 12 02/29/2024   IRONPCTSAT 14 10/11/2023   No results found for: LDH  STUDIES:   No results found.    HISTORY:   Past Medical History:  Diagnosis Date   Anemia    Anxiety    Cancer (HCC)    right breast ILC   Complication of anesthesia    difficulty waking up   Depression    Diverticulitis    Family history of breast cancer    Family history of ovarian cancer    Family history of prostate cancer    Fibromyalgia    GERD (gastroesophageal reflux disease)    Heart murmur    History of colonic polyps    History of hiatal hernia    Hyperlipidemia    Hypertension    IBS (irritable bowel syndrome)    Ischemic colitis 03/2012   Osteoporosis    Pre-diabetes    RLS (restless legs syndrome)    Sinusitis    Sleep apnea    CPAP nightly    Past Surgical History:  Procedure Laterality Date   BREAST LUMPECTOMY WITH RADIOACTIVE SEED LOCALIZATION Right 08/04/2021   Procedure: RIGHT BREAST LUMPECTOMY WITH  RADIOACTIVE SEED LOCALIZATION;  Surgeon: Vanderbilt Ned, MD;  Location: [facility];  Service: General;  Laterality: Right;   CHOLECYSTECTOMY  2004   COLONOSCOPY WITH PROPOFOL  N/A 04/18/2017   Procedure: COLONOSCOPY WITH PROPOFOL ;  Surgeon: Kristie Lamprey, MD;  Location: [facility];  Service: Endoscopy;  Laterality: N/A;   ESOPHAGOGASTRODUODENOSCOPY (EGD) WITH PROPOFOL  N/A 04/18/2017   Procedure: ESOPHAGOGASTRODUODENOSCOPY (EGD) WITH PROPOFOL ;  Surgeon: Kristie Lamprey, MD;  Location: [facility];  Service: Endoscopy;  Laterality: N/A;   EXAM UNDER ANESTHESIA, PELVIC N/A 06/28/2023   Procedure: EXAM UNDER ANESTHESIA, PELVIC;  Surgeon: Viktoria Comer SAUNDERS, MD;  Location: [facility];  Service: Gynecology;  Laterality: N/A;   FLEXIBLE SIGMOIDOSCOPY  04/20/2012   Procedure: FLEXIBLE SIGMOIDOSCOPY;  Surgeon: Belvie JONETTA Just, MD;  Location: [facility];  Service: Endoscopy;  Laterality: N/A;   INGUINAL LYMPHADENECTOMY N/A 06/28/2023   Procedure: LYMPHADENECTOMY, INGUINAL, OPEN;  Surgeon: Viktoria Comer SAUNDERS, MD;  Location: [facility];  Service: Gynecology;  Laterality: N/A;   IR IMAGING GUIDED PORT INSERTION  07/31/2023   NISSEN FUNDOPLICATION  2011   PARAESOPHAGEAL HERNIA REPAIR  09/11/2009   and Nissen fundoplication   RADICAL VULVECTOMY N/A 06/28/2023   Procedure: VULVECTOMY, RADICAL;  Surgeon: Viktoria Comer SAUNDERS, MD;  Location: [facility];  Service: Gynecology;  Laterality: N/A;  Possible skin flap   RE-EXCISION OF BREAST LUMPECTOMY Right 08/18/2021   Procedure: RE-EXCISION RIGHT BREAST LUMPECTOMY;  Surgeon: Vanderbilt Ned, MD;  Location: [facility];  Service: General;  Laterality: Right;   SENTINEL NODE BIOPSY N/A 08/04/2021   Procedure: SENTINEL NODE BIOPSY;  Surgeon: Vanderbilt Ned, MD;  Location: [facility];  Service: General;  Laterality: N/A;   TONSILLECTOMY  1960's   TOTAL ABDOMINAL HYSTERECTOMY  2001   w/ BSO    Family History  Problem Relation Age of Onset   Colon cancer Mother 93    Alzheimer's disease Mother    Heart attack Father    Aneurysm Paternal Grandmother        brain   Stroke Paternal Grandfather    Cancer Maternal Aunt        x2   Alzheimer's disease Maternal Aunt    Ovarian cancer Maternal Aunt  Prostate cancer Maternal Uncle    Kidney disease Maternal Uncle    Atrial fibrillation Maternal Uncle    Stroke Paternal Uncle    Prostate cancer Cousin        paternal first cousin   Prostate cancer Cousin        paternal first cousin   Esophageal cancer Cousin        maternal first cousin   Breast cancer Cousin        DCIS, maternal first cousin   Ovarian cancer Cousin        maternal first cousin   Brain cancer Cousin    Rheum arthritis Other    Lung disease Neg Hx     Social History:  reports that she has never smoked. She has never used smokeless tobacco. She reports that she does not drink alcohol and does not use drugs.The patient is {Blank single:19197::alone,accompanied by} *** today.  Allergies: Allergies[1]  Current Medications: Current Outpatient Medications  Medication Sig Dispense Refill   acetaminophen  (TYLENOL ) 650 MG CR tablet 1,300 mg in the morning and at bedtime.     amLODipine  (NORVASC ) 5 MG tablet Take 5 mg by mouth daily.     Ascorbic Acid (VITAMIN C) 500 MG CHEW Chew 500 mg by mouth daily.     aspirin EC 81 MG tablet Take 81 mg by mouth at bedtime. Swallow whole.     atenolol  (TENORMIN ) 50 MG tablet Take 75 mg by mouth daily.      benazepril  (LOTENSIN ) 10 MG tablet Take 10 mg by mouth daily.     benzonatate (TESSALON) 100 MG capsule Take 100-200 mg by mouth at bedtime.     Biotin 1000 MCG tablet Take 1,000 mcg by mouth daily.     Calcium  Citrate-Vitamin D (CITRACAL + D PO) Take 1 tablet by mouth daily.     cetirizine (ZYRTEC) 10 MG tablet Take 10 mg by mouth at bedtime.     chlorpheniramine-HYDROcodone (TUSSIONEX) 10-8 MG/5ML Take 5 mLs by mouth every 12 (twelve) hours as needed for cough. 90 mL 0   Cholecalciferol  (VITAMIN D) 50 MCG (2000 UT) tablet Take 2,000 Units by mouth daily.     cyanocobalamin  1000 MCG tablet Take 1,000 mcg by mouth daily.     diclofenac sodium (VOLTAREN) 1 % GEL Apply 1 application  topically daily as needed (for pain).     diphenhydrAMINE  (BENADRYL ) 25 MG tablet Take 25 mg by mouth every 6 (six) hours as needed for allergies.     diphenoxylate-atropine (LOMOTIL) 2.5-0.025 MG tablet Take 2 tablets by mouth 4 (four) times daily as needed for diarrhea or loose stools.     DULoxetine  (CYMBALTA ) 60 MG capsule 2 capsules Orally once a day     EPINEPHrine  (EPIPEN  2-PAK) 0.3 mg/0.3 mL IJ SOAJ injection Inject 0.3 mg into the muscle as needed for anaphylaxis.     famotidine  (PEPCID ) 20 MG tablet Take 20 mg by mouth at bedtime.     fexofenadine (ALLEGRA) 180 MG tablet Take 180 mg by mouth in the morning.     fluconazole  (DIFLUCAN ) 200 MG tablet Take 1 tablet (200 mg total) by mouth daily. 10 tablet 1   fluticasone  (FLONASE ) 50 MCG/ACT nasal spray Place 1 spray into both nostrils daily.     lidocaine  (XYLOCAINE ) 5 % ointment Apply 1 Application topically 2 (two) times daily as needed for moderate pain (pain score 4-6) (to the vulva). Apply to vulva as needed for discomfort 35.44 g 3  lidocaine  4 % Place 1 patch onto the skin daily.     linezolid  (ZYVOX ) 600 MG tablet Take 1 tablet (600 mg total) by mouth 2 (two) times daily. 14 tablet 0   NON FORMULARY Pt uses a c-pap nightly     Nutritional Supplements (JUICE PLUS FIBRE PO) Take 2 each by mouth daily. Fruits and Vegetables     nystatin  (MYCOSTATIN /NYSTOP ) powder Apply 1 Application topically 3 (three) times daily. 30 g 5   Omega-3 Fatty Acids (FISH OIL) 1000 MG CAPS Take 1,000 mg by mouth daily.      pantoprazole  (PROTONIX ) 40 MG tablet Take 40 mg by mouth every evening.     Phenylephrine -APAP-Guaifenesin (TYLENOL  SINUS SEVERE PO) Take 2 tablets by mouth daily as needed (drainage).     polyvinyl alcohol (LIQUIFILM TEARS) 1.4 % ophthalmic  solution Place 1 drop into both eyes 5 (five) times daily.     Probiotic Product (PROBIOTIC & ACIDOPHILUS EX ST PO) Take 1 capsule by mouth daily. Ultra Flora Plus Capsules     Propylene Glycol (SYSTANE COMPLETE) 0.6 % SOLN Place 1 drop into both eyes in the morning and at bedtime.     rOPINIRole  (REQUIP ) 0.5 MG tablet Take 0.5 mg by mouth at bedtime.     senna-docusate (SENOKOT-S) 8.6-50 MG tablet Take 2 tablets by mouth at bedtime. For AFTER surgery, do not take if having diarrhea 30 tablet 0   simvastatin  (ZOCOR ) 10 MG tablet Take 10 mg by mouth at bedtime.       sucralfate  (CARAFATE ) 1 GM/10ML suspension Take 1 g by mouth 2 (two) times daily.     traZODone  (DESYREL ) 50 MG tablet Take 50 mg by mouth at bedtime.     triamcinolone  (KENALOG ) 0.025 % ointment Apply 1 Application topically 2 (two) times daily. 30 g 0   vitamin E 400 UNIT capsule Take 400 Units by mouth daily.       White Petrolatum -Mineral Oil (REFRESH P.M. OP) Place 1 Application into both eyes at bedtime.     No current facility-administered medications for this visit.             [1]  Allergies Allergen Reactions   Cefuroxime Other (See Comments), Diarrhea and Nausea Only    IBS   Sulfa  Antibiotics Nausea And Vomiting and Nausea Only   Tramadol Other (See Comments) and Nausea Only   Ceftin [Cefuroxime Axetil] Diarrhea   Compazine  [Prochlorperazine ] Other (See Comments)    Vision problems   Ondansetron  Hcl    Other Other (See Comments)    Pneumonia vaccine (uncoded)   Tramadol Hcl Nausea And Vomiting   Zofran  [Ondansetron ] Other (See Comments)    Vision changes

## 2024-03-14 NOTE — Assessment & Plan Note (Signed)
 Recurrent cellulitis of the lower abdomen.  She started treatment with linezolid  yesterday.

## 2024-03-18 ENCOUNTER — Encounter: Payer: Self-pay | Admitting: Internal Medicine

## 2024-03-18 NOTE — Telephone Encounter (Signed)
 If cellulitis worsens then recommend going to ED

## 2024-04-04 ENCOUNTER — Encounter: Payer: Self-pay | Admitting: Hematology and Oncology

## 2024-04-04 ENCOUNTER — Inpatient Hospital Stay: Admitting: Hematology and Oncology

## 2024-04-04 ENCOUNTER — Inpatient Hospital Stay

## 2024-04-04 ENCOUNTER — Other Ambulatory Visit: Payer: Self-pay

## 2024-04-04 ENCOUNTER — Inpatient Hospital Stay: Attending: Oncology | Admitting: Hematology and Oncology

## 2024-04-04 ENCOUNTER — Other Ambulatory Visit (HOSPITAL_BASED_OUTPATIENT_CLINIC_OR_DEPARTMENT_OTHER): Payer: Self-pay

## 2024-04-04 ENCOUNTER — Telehealth: Payer: Self-pay

## 2024-04-04 VITALS — BP 124/58 | HR 78 | Temp 99.7°F | Resp 16 | Ht 63.0 in | Wt 213.4 lb

## 2024-04-04 DIAGNOSIS — E538 Deficiency of other specified B group vitamins: Secondary | ICD-10-CM

## 2024-04-04 DIAGNOSIS — Z853 Personal history of malignant neoplasm of breast: Secondary | ICD-10-CM | POA: Diagnosis not present

## 2024-04-04 DIAGNOSIS — C519 Malignant neoplasm of vulva, unspecified: Secondary | ICD-10-CM

## 2024-04-04 DIAGNOSIS — M8580 Other specified disorders of bone density and structure, unspecified site: Secondary | ICD-10-CM | POA: Diagnosis not present

## 2024-04-04 DIAGNOSIS — R519 Headache, unspecified: Secondary | ICD-10-CM | POA: Insufficient documentation

## 2024-04-04 DIAGNOSIS — Z923 Personal history of irradiation: Secondary | ICD-10-CM | POA: Insufficient documentation

## 2024-04-04 DIAGNOSIS — B977 Papillomavirus as the cause of diseases classified elsewhere: Secondary | ICD-10-CM | POA: Insufficient documentation

## 2024-04-04 DIAGNOSIS — D509 Iron deficiency anemia, unspecified: Secondary | ICD-10-CM | POA: Insufficient documentation

## 2024-04-04 DIAGNOSIS — Z9079 Acquired absence of other genital organ(s): Secondary | ICD-10-CM | POA: Insufficient documentation

## 2024-04-04 DIAGNOSIS — Z9221 Personal history of antineoplastic chemotherapy: Secondary | ICD-10-CM | POA: Insufficient documentation

## 2024-04-04 DIAGNOSIS — L03311 Cellulitis of abdominal wall: Secondary | ICD-10-CM | POA: Diagnosis not present

## 2024-04-04 DIAGNOSIS — Z15068 Genetic susceptibility to other malignant neoplasm of digestive system: Secondary | ICD-10-CM | POA: Diagnosis not present

## 2024-04-04 DIAGNOSIS — Z8544 Personal history of malignant neoplasm of other female genital organs: Secondary | ICD-10-CM | POA: Diagnosis present

## 2024-04-04 DIAGNOSIS — Z08 Encounter for follow-up examination after completed treatment for malignant neoplasm: Secondary | ICD-10-CM | POA: Insufficient documentation

## 2024-04-04 DIAGNOSIS — Z803 Family history of malignant neoplasm of breast: Secondary | ICD-10-CM | POA: Diagnosis not present

## 2024-04-04 DIAGNOSIS — Z800 Family history of malignant neoplasm of digestive organs: Secondary | ICD-10-CM | POA: Diagnosis not present

## 2024-04-04 LAB — CBC WITH DIFFERENTIAL (CANCER CENTER ONLY)
Abs Immature Granulocytes: 0.04 K/uL (ref 0.00–0.07)
Basophils Absolute: 0 K/uL (ref 0.0–0.1)
Basophils Relative: 0 %
Eosinophils Absolute: 0 K/uL (ref 0.0–0.5)
Eosinophils Relative: 0 %
HCT: 31.2 % — ABNORMAL LOW (ref 36.0–46.0)
Hemoglobin: 10.2 g/dL — ABNORMAL LOW (ref 12.0–15.0)
Immature Granulocytes: 0 %
Lymphocytes Relative: 5 %
Lymphs Abs: 0.5 K/uL — ABNORMAL LOW (ref 0.7–4.0)
MCH: 26.8 pg (ref 26.0–34.0)
MCHC: 32.7 g/dL (ref 30.0–36.0)
MCV: 81.9 fL (ref 80.0–100.0)
Monocytes Absolute: 0.6 K/uL (ref 0.1–1.0)
Monocytes Relative: 6 %
Neutro Abs: 8.9 K/uL — ABNORMAL HIGH (ref 1.7–7.7)
Neutrophils Relative %: 89 %
Platelet Count: 167 K/uL (ref 150–400)
RBC: 3.81 MIL/uL — ABNORMAL LOW (ref 3.87–5.11)
RDW: 17.1 % — ABNORMAL HIGH (ref 11.5–15.5)
WBC Count: 10 K/uL (ref 4.0–10.5)
nRBC: 0 % (ref 0.0–0.2)

## 2024-04-04 LAB — CMP (CANCER CENTER ONLY)
ALT: 15 U/L (ref 0–44)
AST: 24 U/L (ref 15–41)
Albumin: 4.3 g/dL (ref 3.5–5.0)
Alkaline Phosphatase: 72 U/L (ref 38–126)
Anion gap: 11 (ref 5–15)
BUN: 16 mg/dL (ref 8–23)
CO2: 25 mmol/L (ref 22–32)
Calcium: 9.6 mg/dL (ref 8.9–10.3)
Chloride: 102 mmol/L (ref 98–111)
Creatinine: 0.97 mg/dL (ref 0.44–1.00)
GFR, Estimated: >60 mL/min
Glucose, Bld: 99 mg/dL (ref 70–99)
Potassium: 3.9 mmol/L (ref 3.5–5.1)
Sodium: 137 mmol/L (ref 135–145)
Total Bilirubin: 0.4 mg/dL (ref 0.0–1.2)
Total Protein: 6.3 g/dL — ABNORMAL LOW (ref 6.5–8.1)

## 2024-04-04 LAB — MAGNESIUM: Magnesium: 1.9 mg/dL (ref 1.7–2.4)

## 2024-04-04 LAB — VITAMIN B12: Vitamin B-12: 1693 pg/mL — ABNORMAL HIGH (ref 180–914)

## 2024-04-04 MED ORDER — LINEZOLID 600 MG PO TABS
600.0000 mg | ORAL_TABLET | Freq: Two times a day (BID) | ORAL | 0 refills | Status: DC
  Filled 2024-04-04: qty 14, 7d supply, fill #0

## 2024-04-04 NOTE — Assessment & Plan Note (Signed)
 The patient has had chronic anemia.  Repeat iron studies last month were consistent with iron deficiency with a TIBC 498, iron saturation 12% and serum ferritin 20.  I will arrange for her to have IV iron in the upcoming days.

## 2024-04-04 NOTE — Progress Notes (Signed)
 NO IV FLUIDS NEEDED TODAY.

## 2024-04-04 NOTE — Telephone Encounter (Signed)
 Pt has called requesting to come in today for IVF and be seen if needed. She is having low grade fever (T99.7 this am), headache and cellulitis @ her genitals again. Also feels she may be dehydrated. States Dr Cornelius & Eleanor told ehr to call when this happens. Please advise.

## 2024-04-04 NOTE — Assessment & Plan Note (Signed)
 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  She was treated with pelvic vulvectomy, bilateral debulking, radical lymphadenectomy, and inguinal dissection in April 2025.  She was started on concurrent chemoradiation with weekly cisplatin , as well as pembrolizuab every 3 weeks, but had significant difficulty tolerating treatment after her third cycle of weekly cisplatin , she was hospitalized with sepsis due to recurrent cellulitis of the left vulva and thigh.  She then developed shingles of the left thigh.  She had pancytopenia at least in part due to chemotherapy.  Due to the multiple issues due to chemotherapy, we recommended stopping cisplatin  and transitioning to maintenance pembrolizumab  once radiation is complete.  She declined further radiation and immunotherapy. Unfortunately she has had episodes of recurrent cellulitis of the lower abdomen.  She remains without evidence of recurrence.  She will continue to follow with Dr. Viktoria as scheduled.  We will plan to see her back in March with a CBC, comprehensive metabolic panel and magnesium  for repeat clinical assessment.

## 2024-04-04 NOTE — Assessment & Plan Note (Signed)
 Multiple episodes of recurrent vulvar and lower abdominal wall cellulitis. She completed a 7 day course of linezolid  600 mg twice daily 2 weeks ago with reported improvement in the cellulitis. She now has recurrent cellulitis. I will give her another 7 day course of linezolid  and plan to contact Dr. Dennise in infectious disease for any further recommendations. We will plan to see her back with a CBC, CMP and magnesium  in March as scheduled.

## 2024-04-04 NOTE — Progress Notes (Signed)
 " [facility]  [address] [place],  [place]  [address] [phone]  Clinic Day:  04/04/2024  Referring physician: Harl Reyes LABOR, NP  ASSESSMENT & PLAN:   Assessment & Plan: Cellulitis Multiple episodes of recurrent vulvar and lower abdominal wall cellulitis. She completed a 7 day course of linezolid  600 mg twice daily 2 weeks ago with reported improvement in the cellulitis. She now has recurrent cellulitis. I will give her another 7 day course of linezolid  and plan to contact Dr. Dennise in infectious disease for any further recommendations. We will plan to see her back with a CBC, CMP and magnesium  in March as scheduled.  Iron deficiency anemia The patient has had chronic anemia.  Repeat iron studies last month were consistent with iron deficiency with a TIBC 498, iron saturation 12% and serum ferritin 20.  I will arrange for her to have IV iron in the upcoming days.  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  She was treated with pelvic vulvectomy, bilateral debulking, radical lymphadenectomy, and inguinal dissection in April 2025.  She was started on concurrent chemoradiation with weekly cisplatin , as well as pembrolizuab every 3 weeks, but had significant difficulty tolerating treatment after her third cycle of weekly cisplatin , she was hospitalized with sepsis due to recurrent cellulitis of the left vulva and thigh.  She then developed shingles of the left thigh.  She had pancytopenia at least in part due to chemotherapy.  Due to the multiple issues due to chemotherapy, we recommended stopping cisplatin  and transitioning to maintenance pembrolizumab  once radiation is complete.  She declined further radiation and immunotherapy. Unfortunately she has had episodes of recurrent cellulitis of the lower abdomen.  She remains without evidence of recurrence.  She will continue to follow with Dr. Viktoria as scheduled.  We will plan to see her back  in March with a CBC, comprehensive metabolic panel and magnesium  for repeat clinical assessment.    The patient understands the plans discussed today and is in agreement with them.  She knows to contact our office if she develops concerns prior to her next appointment.   I provided 20 minutes of face-to-face time during this encounter and > 50% was spent counseling as documented under my assessment and plan.    Doris Reyes Foy, PA-C  [facility] [place] - A DEPT OF [facility] [address] [place] [address] Dept: [phone] Dept Fax: [phone]   Orders Placed This Encounter  Procedures   Vitamin B12    Standing Status:   Future    Number of Occurrences:   1    Expected Date:   04/04/2024    Expiration Date:   07/03/2024      CHIEF COMPLAINT:  CC: Recurrent cellulitis  Current Treatment: None  HISTORY OF PRESENT ILLNESS:  Doris Reyes 74 y.o. female with stage IIIC (T1b N1c M0) vulvar carcinoma diagnosed in .  She was found to have an abnormal vaginal Pap in July 2024, showing atypical squamous cells, a high-grade squamous intraepithelial lesion could not be excluded.  Vaginal biopsy showed squamous mucosa with mild koilocytic atypia consistent with low-grade squamous intraepithelial lesion.  She saw Dr. Viktoria in GYN oncology in August.  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  She was treated with vaginal estrogen.  At her follow-up in February 2025 she was found to have an ulcerated lesion of the left vulva.  Biopsy revealed invasive moderately differentiated squamous cell carcinoma.PET scan revealed metastatic left inguinal lymph nodes up to 1.8 cm in diameter, but no additional evidence of metastatic disease in the neck, chest, abdomen or pelvis. There was a 4 mm right lower lobe nodule, too small for PET resolution.  Patient had a pelvic vulvectomy, bilateral debulking, radical lymphadenectomy, and  inguinal dissection done on 06/28/2023.  Pathology revealed 3/4 positive nodes on the left side, up to 3 cm and with extensive extranodal tumor, and negative nodes on the right side. The vulvar carcinoma measured 3 cm with depth of invasion 5 mm and carcinoma in situ, for a T1b N2c M0. She had lymphovascular invasion and carcinoma in situ at the 6 o'clock margin with < 1 mm margin for invasive carcinoma.   She also has a history of stage IIA (T2 N0 M0) hormone receptor positive right breast cancer diagnosed in March 2023.  This was found on screening mammogram in February 2023, which revealed architectural distortion.  Diagnostic right mammogram in March revealed mild nipple retraction with a 1 cm area of focal asymmetry in the retroareolar region.  Ultrasound revealed a hypoechoic irregular mass measuring 9 mm in that area.  Biopsy revealed grade 2, invasive mammary carcinoma, as well as mammary carcinoma in situ.  The E-cadherin immunohistochemistry stains were negative and so this is consistent with invasive lobular carcinoma.  Estrogen receptors were positive at 95% and progesterone receptors positive at 95% with HER2 by immunohistochemistry positive at 2+, but FISH was negative.  Ki-67 was less than 5%.  The carcinoma in situ was positive for E-cadherin, consistent with ductal carcinoma in situ.  MRI breast revealed focal enhancement in the right breast surrounding the biopsy clip and some non-mass enhancement posterior to the malignancy.  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  This was strongly ER/PR positive, HER2 negative and has a low Ki 67 of less than 5%. She was treated with lumpectomy but had to go back for reexcision of the margins. The sentinel lymph  node was negative but she did have 2 primaries, measuring 2.4 cm and 2.2 cm. Even though she had several favorable characteristics, such as grade 1 histology and a low Ki 67, we recommended that we pursue Endopredict testing to quantitate her risk for recurrence. Fortunately, the EpClin score was 2.6, low risk. This correlates with a 5.1% risk of distant recurrence in the next 10 years. Her benefit from chemotherapy would be 1.0%, so we did not recommend chemotherapy. Her risk for a late recurrence is 4.0%. She has refused hormonal therapy.  Bilateral diagnostic mammogram in February 2025 did not reveal any evidence of malignancy.  Bone density scan in April 2025 revealed osteopenia for which she is on calcium  and vitamin D.  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  It was recommended that she have colonoscopy beginning at age 33 or 10 years prior to the age of her  first-degree use relatives age at colorectal cancer diagnosis.  It was recommended this be repeated every 5 years.  This was explained to her.   Oncology History  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)  06/08/2021 Initial Diagnosis   Breast cancer in female [facility])   07/23/2021 Genetic Testing   Negative hereditary cancer genetic testing: no pathogenic variants detected in [facility] BRCAPlus Panel. Report date is July 23, 2021.  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  The report date is Jul 26, 2021.  The BRCAplus panel offered by [employer] and includes  sequencing and deletion/duplication analysis for the following 8 genes: ATM, BRCA1, BRCA2, CDH1, CHEK2, PALB2, PTEN, and TP53.  Results of pan-cancer panel pending.   The CancerNext-Expanded gene panel offered by [facility] and includes sequencing and rearrangement analysis for the following 77 genes: AIP, ALK, APC*, ATM*, AXIN2, BAP1, BARD1, BLM, BMPR1A, BRCA1*, BRCA2*, BRIP1*, CDC73, CDH1*, CDK4, CDKN1B, CDKN2A, CHEK2*, CTNNA1, DICER1, FANCC, FH, FLCN, GALNT12, KIF1B, LZTR1, MAX, MEN1, MET, MLH1*, MSH2*, MSH3, MSH6*, MUTYH*, NBN, NF1*, NF2, NTHL1, PALB2*, PHOX2B, PMS2*, POT1, PRKAR1A, PTCH1, PTEN*, RAD51C*, RAD51D*, RB1, RECQL, RET, SDHA, SDHAF2, SDHB, SDHC, SDHD, SMAD4, SMARCA4, SMARCB1, SMARCE1, STK11, SUFU, TMEM127, TP53*, TSC1, TSC2, VHL and XRCC2 (sequencing and deletion/duplication); EGFR, EGLN1, HOXB13, KIT, MITF, PDGFRA, POLD1, and POLE (sequencing only); EPCAM and GREM1 (deletion/duplication only). DNA and RNA analyses performed for * genes.    08/23/2021 Cancer Staging   Staging form: Breast, AJCC 8th Edition - Pathologic stage from 08/23/2021: Stage IA (pT2(2), pN0(sn), cM0, G1, ER+, PR+, HER2-) - Signed by Cornelius Wanda DEL, MD on 09/29/2021 Histopathologic type: Lobular carcinoma, NOS Stage prefix: Initial diagnosis Method of lymph node assessment: Sentinel lymph node biopsy Nuclear grade: G1 Multigene prognostic tests performed: EndoPredict Histologic grading system: 3 grade system Residual tumor (R): R0 - None Laterality: Right Tumor size (mm): 24 Multiple tumors: Yes Number of tumors: 2 Lymph-vascular invasion (LVI): LVI not present (absent)/not identified Diagnostic confirmation: Positive histology PLUS positive immunophenotyping and/or positive genetic studies Specimen type: Excision Staged by: Managing physician Menopausal status: Postmenopausal Ki-67 (%): 5 Stage used in treatment planning: Yes National guidelines used in treatment planning: Yes Type of national  guideline used in treatment planning: NCCN   Vulvar cancer (HCC)  06/28/2023 Initial Diagnosis   Vulvar cancer (HCC)   06/28/2023 Cancer Staging   Staging form: Vulva, AJCC V9 - Clinical stage from 06/28/2023: FIGO Stage IIIC (cT1b, cN1c, cM0) - Signed by Cornelius Wanda DEL, MD on 07/31/2023 Histopathologic type: Squamous cell carcinoma, NOS Stage prefix: Initial diagnosis Method of lymph node assessment: Lymph node dissection Histologic grade (G): G2 Histologic grading system: 3 grade system Tumor size (mm): 30 Lymph-vascular invasion (LVI): LVI present/identified, NOS Diagnostic confirmation: Positive histology Specimen type: Excision Staged by: Managing physician Femoral-inguinal nodal status: Positive Solitary (s) or multifocal (m) tumors in the primary site: Solitary Perineural invasion (PNI): Unknown Stage used in treatment planning: Yes National guidelines used in treatment planning: Yes Type of national guideline used in treatment planning: NCCN   09/04/2023 - 09/18/2023 Chemotherapy   Patient is on Treatment Plan : CERVICAL Pembrolizumab  q21d + XRT (cisplatin  d/c'd 09/25/23)     12/22/2023 - 12/22/2023 Chemotherapy   Patient is on Treatment Plan : CERVICAL Pembrolizumab  (200) q21d         INTERVAL HISTORY:   Doris Reyes is added to the schedule today she telephoned reporting  recurrent cellulitis.  She completed a course of linezolid  600 mg twice daily 2 weeks ago as recommended by Dr. Dennise in infectious disease.  She did feeling more fatigued for the past 2 days, yesterday she noticed increased redness and pain in the vulva, lower abdomen and left thigh, as well as itching. She denies vaginal discharge.  She had chills last night.  She has a headache today.  She is also concerned she may need IV fluids.  She states she is drinking plenty of fluids.  She states she usually gets IV fluids when she is feeling poorly.  She denies fevers or chills. She denies pain. Her appetite is good. Her  weight has been stable.  REVIEW OF SYSTEMS:   Review of Systems  Constitutional:  Positive for chills and fatigue. Negative for appetite change, fever and unexpected weight change.  HENT:   Negative for lump/mass, mouth sores and sore throat.   Respiratory:  Negative for cough and shortness of breath.   Cardiovascular:  Negative for chest pain and leg swelling.  Gastrointestinal:  Negative for abdominal pain, constipation, diarrhea, nausea and vomiting.  Endocrine: Negative for hot flashes.  Genitourinary:  Negative for difficulty urinating, dysuria, frequency and hematuria.   Musculoskeletal:  Negative for arthralgias, back pain and myalgias.  Skin:  Positive for itching and rash.  Neurological:  Positive for headaches. Negative for dizziness.  Hematological:  Negative for adenopathy. Does not bruise/bleed easily.  Psychiatric/Behavioral:  Negative for depression and sleep disturbance. The patient is not nervous/anxious.      VITALS:   Blood pressure (!) 124/58, pulse 78, temperature 99.7 F (37.6 C), temperature source Oral, resp. rate 16, height 5' 3 (1.6 m), weight 213 lb 6.4 oz (96.8 kg), SpO2 100%.  Wt Readings from Last 3 Encounters:  04/04/24 213 lb 6.4 oz (96.8 kg)  02/29/24 213 lb 11.2 oz (96.9 kg)  02/01/24 210 lb 14.4 oz (95.7 kg)    Body mass index is 37.8 kg/m.  Performance status (ECOG): 1 - Symptomatic but completely ambulatory    PHYSICAL EXAM:   Physical Exam Vitals and nursing note reviewed.  Constitutional:      General: She is not in acute distress.    Appearance: Normal appearance.  HENT:     Head: Normocephalic and atraumatic.     Mouth/Throat:     Mouth: Mucous membranes are moist.     Pharynx: Oropharynx is clear. No oropharyngeal exudate or posterior oropharyngeal erythema.  Eyes:     General: No scleral icterus.    Extraocular Movements: Extraocular movements intact.     Conjunctiva/sclera: Conjunctivae normal.     Pupils: Pupils are  equal, round, and reactive to light.  Cardiovascular:     Rate and Rhythm: Normal rate and regular rhythm.     Heart sounds: Normal heart sounds. No murmur heard.    No friction rub. No gallop.  Pulmonary:     Effort: Pulmonary effort is normal.     Breath sounds: Normal breath sounds. No wheezing, rhonchi or rales.  Chest:     Comments: Breast exam is deferred Abdominal:     General: There is no distension.     Palpations: Abdomen is soft. There is no mass.     Tenderness: There is no abdominal tenderness.  Genitourinary:    Comments: No obvious vulvar lesions Musculoskeletal:        General: Normal range of motion.     Cervical back: Normal range of motion and neck  supple. No tenderness.     Right lower leg: No edema.     Left lower leg: No edema.  Lymphadenopathy:     Cervical: No cervical adenopathy.     Upper Body:     Right upper body: No supraclavicular or axillary adenopathy.     Left upper body: No supraclavicular or axillary adenopathy.     Lower Body: No right inguinal adenopathy. No left inguinal adenopathy.  Skin:    General: Skin is warm and dry.     Coloration: Skin is not jaundiced.     Findings: Erythema present.     Comments: Significant erythema and warmth from the lower abdomen into the left thigh vulva and perineum with subcutaneous edema of the lower abdomen and suprapubic area  Neurological:     Mental Status: She is alert and oriented to person, place, and time.     Cranial Nerves: No cranial nerve deficit.  Psychiatric:        Mood and Affect: Mood normal.        Behavior: Behavior normal.        Thought Content: Thought content normal.     LABS:      Latest Ref Rng & Units 04/04/2024   10:40 AM 03/14/2024    1:33 PM 02/29/2024    1:31 PM  CBC  WBC 4.0 - 10.5 K/uL 10.0  11.7  4.8   Hemoglobin 12.0 - 15.0 g/dL 89.7  9.9  89.5   Hematocrit 36.0 - 46.0 % 31.2  30.7  31.8   Platelets 150 - 400 K/uL 167  188  195       Latest Ref Rng & Units  04/04/2024   10:40 AM 03/14/2024    1:33 PM 02/29/2024    1:31 PM  CMP  Glucose 70 - 99 mg/dL 99  873  899   BUN 8 - 23 mg/dL 16  21  20    Creatinine 0.44 - 1.00 mg/dL 9.02  8.71  8.79   Sodium 135 - 145 mmol/L 137  133  139   Potassium 3.5 - 5.1 mmol/L 3.9  3.6  3.8   Chloride 98 - 111 mmol/L 102  96  104   CO2 22 - 32 mmol/L 25  24  25    Calcium  8.9 - 10.3 mg/dL 9.6  9.1  9.2   Total Protein 6.5 - 8.1 g/dL 6.3  6.4  6.3   Total Bilirubin 0.0 - 1.2 mg/dL 0.4  0.3  0.2   Alkaline Phos 38 - 126 U/L 72  87  74   AST 15 - 41 U/L 24  21  23    ALT 0 - 44 U/L 15  22  20      Lab Results  Component Value Date   TIBC 498 (H) 02/29/2024   TIBC 441 10/11/2023   FERRITIN 20 02/29/2024   FERRITIN 65 10/11/2023   IRONPCTSAT 12 02/29/2024   IRONPCTSAT 14 10/11/2023     STUDIES:   No results found.    HISTORY:   Past Medical History:  Diagnosis Date   Anemia    Anxiety    Cancer (HCC)    right breast ILC   Complication of anesthesia    difficulty waking up   Depression    Diverticulitis    Family history of breast cancer    Family history of ovarian cancer    Family history of prostate cancer    Fibromyalgia    GERD (gastroesophageal reflux disease)  Heart murmur    History of colonic polyps    History of hiatal hernia    Hyperlipidemia    Hypertension    IBS (irritable bowel syndrome)    Ischemic colitis 03/2012   Osteoporosis    Pre-diabetes    RLS (restless legs syndrome)    Sinusitis    Sleep apnea    CPAP nightly    Past Surgical History:  Procedure Laterality Date   BREAST LUMPECTOMY WITH RADIOACTIVE SEED LOCALIZATION Right 08/04/2021   Procedure: RIGHT BREAST LUMPECTOMY WITH RADIOACTIVE SEED LOCALIZATION;  Surgeon: Vanderbilt Ned, MD;  Location: [facility];  Service: General;  Laterality: Right;   CHOLECYSTECTOMY  2004   COLONOSCOPY WITH PROPOFOL  N/A 04/18/2017   Procedure: COLONOSCOPY WITH PROPOFOL ;  Surgeon: Kristie Lamprey, MD;  Location: [facility];   Service: Endoscopy;  Laterality: N/A;   ESOPHAGOGASTRODUODENOSCOPY (EGD) WITH PROPOFOL  N/A 04/18/2017   Procedure: ESOPHAGOGASTRODUODENOSCOPY (EGD) WITH PROPOFOL ;  Surgeon: Kristie Lamprey, MD;  Location: [facility];  Service: Endoscopy;  Laterality: N/A;   EXAM UNDER ANESTHESIA, PELVIC N/A 06/28/2023   Procedure: EXAM UNDER ANESTHESIA, PELVIC;  Surgeon: Doris Reyes Comer SAUNDERS, MD;  Location: [facility];  Service: Gynecology;  Laterality: N/A;   FLEXIBLE SIGMOIDOSCOPY  04/20/2012   Procedure: FLEXIBLE SIGMOIDOSCOPY;  Surgeon: Belvie JONETTA Just, MD;  Location: [facility];  Service: Endoscopy;  Laterality: N/A;   INGUINAL LYMPHADENECTOMY N/A 06/28/2023   Procedure: LYMPHADENECTOMY, INGUINAL, OPEN;  Surgeon: Doris Reyes Comer SAUNDERS, MD;  Location: [facility];  Service: Gynecology;  Laterality: N/A;   IR IMAGING GUIDED PORT INSERTION  07/31/2023   NISSEN FUNDOPLICATION  2011   PARAESOPHAGEAL HERNIA REPAIR  09/11/2009   and Nissen fundoplication   RADICAL VULVECTOMY N/A 06/28/2023   Procedure: VULVECTOMY, RADICAL;  Surgeon: Doris Reyes Comer SAUNDERS, MD;  Location: [facility];  Service: Gynecology;  Laterality: N/A;  Possible skin flap   RE-EXCISION OF BREAST LUMPECTOMY Right 08/18/2021   Procedure: RE-EXCISION RIGHT BREAST LUMPECTOMY;  Surgeon: Vanderbilt Ned, MD;  Location: [facility];  Service: General;  Laterality: Right;   SENTINEL NODE BIOPSY N/A 08/04/2021   Procedure: SENTINEL NODE BIOPSY;  Surgeon: Vanderbilt Ned, MD;  Location: [facility];  Service: General;  Laterality: N/A;   TONSILLECTOMY  1960's   TOTAL ABDOMINAL HYSTERECTOMY  2001   w/ BSO    Family History  Problem Relation Age of Onset   Colon cancer Mother 70   Alzheimer's disease Mother    Heart attack Father    Aneurysm Paternal Grandmother        brain   Stroke Paternal Grandfather    Cancer Maternal Aunt        x2   Alzheimer's disease Maternal Aunt    Ovarian cancer Maternal Aunt    Prostate cancer Maternal Uncle    Kidney disease  Maternal Uncle    Atrial fibrillation Maternal Uncle    Stroke Paternal Uncle    Prostate cancer Cousin        paternal first cousin   Prostate cancer Cousin        paternal first cousin   Esophageal cancer Cousin        maternal first cousin   Breast cancer Cousin        DCIS, maternal first cousin   Ovarian cancer Cousin        maternal first cousin   Brain cancer Cousin    Rheum arthritis Other    Lung disease Neg Hx     Social History:  reports that  she has never smoked. She has never used smokeless tobacco. She reports that she does not drink alcohol and does not use drugs.The patient is alone today.  Allergies: Allergies[1]  Current Medications: Current Outpatient Medications  Medication Sig Dispense Refill   acetaminophen  (TYLENOL ) 650 MG CR tablet 1,300 mg in the morning and at bedtime.     amLODipine  (NORVASC ) 5 MG tablet Take 5 mg by mouth daily.     Ascorbic Acid (VITAMIN C) 500 MG CHEW Chew 500 mg by mouth daily.     aspirin EC 81 MG tablet Take 81 mg by mouth at bedtime. Swallow whole.     atenolol  (TENORMIN ) 50 MG tablet Take 75 mg by mouth daily.      benazepril  (LOTENSIN ) 10 MG tablet Take 10 mg by mouth daily.     benzonatate (TESSALON) 100 MG capsule Take 100-200 mg by mouth at bedtime.     Biotin 1000 MCG tablet Take 1,000 mcg by mouth daily.     Calcium  Citrate-Vitamin D (CITRACAL + D PO) Take 1 tablet by mouth daily.     cetirizine (ZYRTEC) 10 MG tablet Take 10 mg by mouth at bedtime.     chlorpheniramine-HYDROcodone (TUSSIONEX) 10-8 MG/5ML Take 5 mLs by mouth every 12 (twelve) hours as needed for cough. 90 mL 0   Cholecalciferol (VITAMIN D) 50 MCG (2000 UT) tablet Take 2,000 Units by mouth daily.     cyanocobalamin  1000 MCG tablet Take 1,000 mcg by mouth daily.     diclofenac sodium (VOLTAREN) 1 % GEL Apply 1 application  topically daily as needed (for pain).     diphenhydrAMINE  (BENADRYL ) 25 MG tablet Take 25 mg by mouth every 6 (six) hours as needed for  allergies.     diphenoxylate-atropine (LOMOTIL) 2.5-0.025 MG tablet Take 2 tablets by mouth 4 (four) times daily as needed for diarrhea or loose stools.     DULoxetine  (CYMBALTA ) 60 MG capsule 2 capsules Orally once a day     famotidine  (PEPCID ) 20 MG tablet Take 20 mg by mouth at bedtime.     fexofenadine (ALLEGRA) 180 MG tablet Take 180 mg by mouth in the morning.     fluticasone  (FLONASE ) 50 MCG/ACT nasal spray Place 1 spray into both nostrils daily.     gabapentin  (NEURONTIN ) 300 MG capsule Take 1 capsule (300 mg total) by mouth at bedtime. 60 capsule 1   lidocaine  (XYLOCAINE ) 5 % ointment Apply 1 Application topically 2 (two) times daily as needed for moderate pain (pain score 4-6) (to the vulva). Apply to vulva as needed for discomfort 35.44 g 3   lidocaine  4 % Place 1 patch onto the skin daily.     NON FORMULARY Pt uses a c-pap nightly     Nutritional Supplements (JUICE PLUS FIBRE PO) Take 2 each by mouth daily. Fruits and Vegetables     nystatin  (MYCOSTATIN /NYSTOP ) powder Apply 1 Application topically 3 (three) times daily. 30 g 5   Omega-3 Fatty Acids (FISH OIL) 1000 MG CAPS Take 1,000 mg by mouth daily.      pantoprazole  (PROTONIX ) 40 MG tablet Take 40 mg by mouth every evening.     Phenylephrine -APAP-Guaifenesin (TYLENOL  SINUS SEVERE PO) Take 2 tablets by mouth daily as needed (drainage).     polyvinyl alcohol (LIQUIFILM TEARS) 1.4 % ophthalmic solution Place 1 drop into both eyes 5 (five) times daily.     Probiotic Product (PROBIOTIC & ACIDOPHILUS EX ST PO) Take 1 capsule by mouth daily. Ultra Flora Plus  Capsules     Propylene Glycol (SYSTANE COMPLETE) 0.6 % SOLN Place 1 drop into both eyes in the morning and at bedtime.     rOPINIRole  (REQUIP ) 0.5 MG tablet Take 0.5 mg by mouth at bedtime.     senna-docusate (SENOKOT-S) 8.6-50 MG tablet Take 2 tablets by mouth at bedtime. For AFTER surgery, do not take if having diarrhea 30 tablet 0   simvastatin  (ZOCOR ) 10 MG tablet Take 10 mg by  mouth at bedtime.       sucralfate  (CARAFATE ) 1 GM/10ML suspension Take 1 g by mouth 2 (two) times daily.     traZODone  (DESYREL ) 50 MG tablet Take 50 mg by mouth at bedtime.     triamcinolone  (KENALOG ) 0.025 % ointment Apply 1 Application topically 2 (two) times daily. 30 g 0   vitamin E 400 UNIT capsule Take 400 Units by mouth daily.       White Petrolatum -Mineral Oil (REFRESH P.M. OP) Place 1 Application into both eyes at bedtime.     EPINEPHrine  (EPIPEN  2-PAK) 0.3 mg/0.3 mL IJ SOAJ injection Inject 0.3 mg into the muscle as needed for anaphylaxis.     linezolid  (ZYVOX ) 600 MG tablet Take 1 tablet (600 mg total) by mouth 2 (two) times daily. 14 tablet 0   No current facility-administered medications for this visit.             [1]  Allergies Allergen Reactions   Cefuroxime Other (See Comments), Diarrhea and Nausea Only    IBS   Sulfa  Antibiotics Nausea And Vomiting and Nausea Only   Tramadol Other (See Comments) and Nausea Only   Ceftin [Cefuroxime Axetil] Diarrhea   Compazine  [Prochlorperazine ] Other (See Comments)    Vision problems   Ondansetron  Hcl    Other Other (See Comments)    Pneumonia vaccine (uncoded)   Tramadol Hcl Nausea And Vomiting   Zofran  [Ondansetron ] Other (See Comments)    Vision changes   "

## 2024-04-05 ENCOUNTER — Other Ambulatory Visit: Payer: Self-pay | Admitting: Hematology and Oncology

## 2024-04-05 DIAGNOSIS — D509 Iron deficiency anemia, unspecified: Secondary | ICD-10-CM

## 2024-04-08 ENCOUNTER — Encounter: Payer: Self-pay | Admitting: Hematology and Oncology

## 2024-04-08 ENCOUNTER — Telehealth: Payer: Self-pay | Admitting: Hematology and Oncology

## 2024-04-08 NOTE — Telephone Encounter (Signed)
-----   Message from Kelli A Mosher sent at 04/05/2024  4:54 PM EST ----- Please schedule monoferric  x 1 next available. Thanks

## 2024-04-08 NOTE — Telephone Encounter (Signed)
 Patient has been scheduled. Aware of appt date and time.

## 2024-04-09 ENCOUNTER — Inpatient Hospital Stay

## 2024-04-09 VITALS — BP 137/71 | HR 56 | Temp 97.7°F | Resp 18

## 2024-04-09 DIAGNOSIS — Z08 Encounter for follow-up examination after completed treatment for malignant neoplasm: Secondary | ICD-10-CM | POA: Diagnosis not present

## 2024-04-09 DIAGNOSIS — D509 Iron deficiency anemia, unspecified: Secondary | ICD-10-CM

## 2024-04-09 MED ORDER — ACETAMINOPHEN 325 MG PO TABS
650.0000 mg | ORAL_TABLET | Freq: Once | ORAL | Status: AC
  Administered 2024-04-09: 650 mg via ORAL
  Filled 2024-04-09: qty 2

## 2024-04-09 MED ORDER — SODIUM CHLORIDE 0.9 % IV SOLN
1000.0000 mg | Freq: Once | INTRAVENOUS | Status: AC
  Administered 2024-04-09: 1000 mg via INTRAVENOUS
  Filled 2024-04-09: qty 1000

## 2024-04-09 MED ORDER — LORATADINE 10 MG PO TABS: 10.0000 mg | ORAL_TABLET | Freq: Once | ORAL | Status: DC

## 2024-04-09 MED ORDER — SODIUM CHLORIDE 0.9 % IV SOLN: INTRAVENOUS | Status: DC

## 2024-04-09 NOTE — Patient Instructions (Signed)

## 2024-04-18 ENCOUNTER — Encounter: Payer: Self-pay | Admitting: Gynecologic Oncology

## 2024-04-18 ENCOUNTER — Inpatient Hospital Stay: Admitting: Gynecologic Oncology

## 2024-04-18 VITALS — BP 122/63 | HR 66 | Temp 97.8°F | Resp 19 | Wt 209.6 lb

## 2024-04-18 DIAGNOSIS — C519 Malignant neoplasm of vulva, unspecified: Secondary | ICD-10-CM

## 2024-04-18 DIAGNOSIS — L03311 Cellulitis of abdominal wall: Secondary | ICD-10-CM

## 2024-04-18 DIAGNOSIS — Z8544 Personal history of malignant neoplasm of other female genital organs: Secondary | ICD-10-CM | POA: Diagnosis not present

## 2024-04-18 DIAGNOSIS — Z08 Encounter for follow-up examination after completed treatment for malignant neoplasm: Secondary | ICD-10-CM

## 2024-04-18 DIAGNOSIS — B379 Candidiasis, unspecified: Secondary | ICD-10-CM

## 2024-04-18 DIAGNOSIS — Z872 Personal history of diseases of the skin and subcutaneous tissue: Secondary | ICD-10-CM

## 2024-04-18 MED ORDER — LINEZOLID 600 MG PO TABS: 600.0000 mg | ORAL_TABLET | Freq: Two times a day (BID) | ORAL | 0 refills | Status: AC

## 2024-04-18 MED ORDER — FLUCONAZOLE 150 MG PO TABS: 150.0000 mg | ORAL_TABLET | Freq: Every day | ORAL | 1 refills | Status: DC

## 2024-04-18 NOTE — Progress Notes (Signed)
 Gynecologic Oncology Return Clinic Visit  04/18/24  Reason for Visit: surveillance  Treatment History: Oncology History  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)  06/08/2021 Initial Diagnosis   Breast cancer in female [facility])   07/23/2021 Genetic Testing   Negative hereditary cancer genetic testing: no pathogenic variants detected in [facility] BRCAPlus Panel. Report date is July 23, 2021.  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  The report date is Jul 26, 2021.  The BRCAplus panel offered by [employer] and includes sequencing and deletion/duplication analysis for the following 8 genes: ATM, BRCA1, BRCA2, CDH1, CHEK2, PALB2, PTEN, and TP53.  Results of pan-cancer panel pending.   The CancerNext-Expanded gene panel offered by [facility] and includes sequencing and rearrangement analysis for the following 77 genes: AIP, ALK, APC*, ATM*, AXIN2, BAP1, BARD1, BLM, BMPR1A, BRCA1*, BRCA2*, BRIP1*, CDC73, CDH1*, CDK4, CDKN1B, CDKN2A, CHEK2*, CTNNA1, DICER1, FANCC, FH, FLCN, GALNT12, KIF1B, LZTR1, MAX, MEN1, MET, MLH1*, MSH2*, MSH3, MSH6*, MUTYH*, NBN, NF1*, NF2, NTHL1, PALB2*, PHOX2B, PMS2*, POT1, PRKAR1A, PTCH1, PTEN*, RAD51C*, RAD51D*, RB1, RECQL, RET, SDHA, SDHAF2, SDHB, SDHC, SDHD, SMAD4, SMARCA4, SMARCB1, SMARCE1, STK11, SUFU, TMEM127, TP53*, TSC1, TSC2, VHL and XRCC2 (sequencing and deletion/duplication); EGFR, EGLN1, HOXB13, KIT, MITF, PDGFRA, POLD1, and POLE (sequencing only); EPCAM and GREM1 (deletion/duplication only). DNA and RNA analyses performed for * genes.    08/23/2021 Cancer Staging   Staging form: Breast, AJCC 8th Edition - Pathologic stage from 08/23/2021: Stage IA (pT2(2), pN0(sn), cM0, G1, ER+, PR+, HER2-) - Signed by Cornelius Wanda DEL, MD on 09/29/2021 Histopathologic type: Lobular carcinoma, NOS Stage prefix: Initial diagnosis Method  of lymph node assessment: Sentinel lymph node biopsy Nuclear grade: G1 Multigene prognostic tests performed: EndoPredict Histologic grading system: 3 grade system Residual tumor (R): R0 - None Laterality: Right Tumor size (mm): 24 Multiple tumors: Yes Number of tumors: 2 Lymph-vascular invasion (LVI): LVI not present (absent)/not identified Diagnostic confirmation: Positive histology PLUS positive immunophenotyping and/or positive genetic studies Specimen type: Excision Staged by: Managing physician Menopausal status: Postmenopausal Ki-67 (%): 5 Stage used in treatment planning: Yes National guidelines used in treatment planning: Yes Type of national guideline used in treatment planning: NCCN   Vulvar cancer (HCC)  06/28/2023 Initial Diagnosis   Vulvar cancer (HCC)   06/28/2023 Cancer Staging   Staging form: Vulva, AJCC V9 - Clinical stage from 06/28/2023: FIGO Stage IIIC (cT1b, cN1c, cM0) - Signed by Cornelius Wanda DEL, MD on 07/31/2023 Histopathologic type: Squamous cell carcinoma, NOS Stage prefix: Initial diagnosis Method of lymph node assessment: Lymph node dissection Histologic grade (G): G2 Histologic grading system: 3 grade system Tumor size (mm): 30 Lymph-vascular invasion (LVI): LVI present/identified, NOS Diagnostic confirmation: Positive histology Specimen type: Excision Staged by: Managing physician Femoral-inguinal nodal status: Positive Solitary (s) or multifocal (m) tumors in the primary site: Solitary Perineural invasion (PNI): Unknown Stage used in treatment planning: Yes National guidelines used in treatment planning: Yes Type of national guideline used in treatment planning: NCCN   09/04/2023 - 09/18/2023 Chemotherapy   Patient is on Treatment Plan : CERVICAL Pembrolizumab  q21d + XRT (cisplatin  d/c'd 09/25/23)     12/22/2023 - 12/22/2023 Chemotherapy   Patient is on Treatment Plan : CERVICAL Pembrolizumab  (200) q21d      The patient endorsed having occasional  abnormal pap smears prior to her hysterectomy in 2011 for fibroids. She denied ever having cervical biopsies or procedures done, thinks that Pap  smears were just repeated. After her hysterectomy, she had a period of time where she did not get follow-up. Since establishing with her OB/GYN provider, she has been getting Pap smears which were initially normal. Her more recent Pap history is noted below.    09/21/20: ASCUS pap 09/27/2021: ASC-H pap 11/03/21: Vaginoscopy. 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 Biopsy shows squamous mucosa with mild koilocytic atypia c/w LSIL lesion. Vaginal biopsy 10/2022: HPV effect with mild dysplasia (VAIN1). Started using vaginal estrogen   Seen on 05/11/23. Reports new area within the vagina, some pruritus and pain. 1 cm area of ulceration noted along the left mid vulva with tenderness on palpation. Biopsy recommended - she preferred to come back when she had a driver.  Biopsy performed on 05/25/23, revealed grade 2 SCC, DOI at least 1.25 mm.    06/05/23: PET shows hypermetabolic left inguinal lymph nodes measuring up to 1.8 cm. No other hypermetabolism noted.    On 06/28/2023, she underwent partial modified radical left vulvectomy, bilateral inguinal lymphadenectomy with Dr. Comer Dollar. Her post-operative course was overall non-complicated. She stayed in the hospital until POD 2 with improved mobility at that time. Final path returned with: A. LYMPH NODE, LEFT INGUINAL, RESECTION:  Metastatic carcinoma in three of four lymph nodes (3/4).  Largest metastasis is 3 cm.  Extensive extranodal tumor.   B. LYMPH NODE, RIGHT INGUINAL, RESECTION:  Two lymph nodes negative for metastatic carcinoma (0/2).   C. VULVA, LEFT STITCH AT 12, VULVECTOMY:  Invasive squamous cell carcinoma associated with squamous cell  carcinoma in situ, 3 cm.  Greatest depth of invasion is 5 mm.  Carcinoma in situ focally involves the 6:00 margin.  Invasive carcinoma focally less than 1 mm from 6:00 margin.  Lymphovascular space involvement by carcinoma.  See oncology table and comment.   D. LATERAL DEEP MARGIN:  Benign adipose tissue and connective tissue.  Negative for carcinoma.  Final lateral deep margin negative for carcinoma.    Per Dr. Cornelius, Medical Oncologist note from 12/21/23: She was receiving concurrent chemoradiation with weekly cisplatin , as well as pembrolizuab every 3 weeks since her PDL 1 score is 85%, followed by maintenance pembrolizumab . She had difficulty tolerating treatment due to nausea, vomiting and diarrhea, as well as hypomagnesemia. We gave her additional IV fluids weekly and IV magnesium  as needed. She developed mild pancytopenia felt to be most likely due to treatment. After her third cycle of weekly cisplatin , she was hospitalized with sepsis due to recurrent cellulitis of the left vulva and thigh. She then developed shingles of the left thigh. She had pancytopenia at least in part due to chemotherapy. At discharge on July 4, WBCs were 2.5, hemoglobin 8.7 and platelets 142,000. She was discharged on doxycycline and valacyclovir. Due to the multiple issues with chemotherapy and radiation, she has declined taking any further therapy. She had about half of her treatment. I offered her continuation of immunotherapy but she declines that as well.    She has been following with Infectious Disease for recurrent vulvar cellulitis. The patient was advised to start taking linezolid  for one week and notify her ID team if cellulitis returned per Dr. Dennise.  Interval History: Overall doing well.  Treated for vulvar cellulitis again earlier this month.  Doing well now.  Has had some vulvar itching which she thinks is related to a  yeast infection.  Took gabapentin  for thigh pain for several days.  Denies any  vaginal bleeding or discharge.  Past Medical/Surgical History: Past Medical History:  Diagnosis Date   Anemia    Anxiety    Cancer (HCC)    right breast ILC   Complication of anesthesia    difficulty waking up   Depression    Diverticulitis    Family history of breast cancer    Family history of ovarian cancer    Family history of prostate cancer    Fibromyalgia    GERD (gastroesophageal reflux disease)    Heart murmur    History of colonic polyps    History of hiatal hernia    Hyperlipidemia    Hypertension    IBS (irritable bowel syndrome)    Ischemic colitis 03/2012   Osteoporosis    Pre-diabetes    RLS (restless legs syndrome)    Sinusitis    Sleep apnea    CPAP nightly    Past Surgical History:  Procedure Laterality Date   BREAST LUMPECTOMY WITH RADIOACTIVE SEED LOCALIZATION Right 08/04/2021   Procedure: RIGHT BREAST LUMPECTOMY WITH RADIOACTIVE SEED LOCALIZATION;  Surgeon: Vanderbilt Ned, MD;  Location: [facility];  Service: General;  Laterality: Right;   CHOLECYSTECTOMY  2004   COLONOSCOPY WITH PROPOFOL  N/A 04/18/2017   Procedure: COLONOSCOPY WITH PROPOFOL ;  Surgeon: Kristie Lamprey, MD;  Location: [facility];  Service: Endoscopy;  Laterality: N/A;   ESOPHAGOGASTRODUODENOSCOPY (EGD) WITH PROPOFOL  N/A 04/18/2017   Procedure: ESOPHAGOGASTRODUODENOSCOPY (EGD) WITH PROPOFOL ;  Surgeon: Kristie Lamprey, MD;  Location: [facility];  Service: Endoscopy;  Laterality: N/A;   EXAM UNDER ANESTHESIA, PELVIC N/A 06/28/2023   Procedure: EXAM UNDER ANESTHESIA, PELVIC;  Surgeon: Viktoria Comer SAUNDERS, MD;  Location: [facility];  Service: Gynecology;  Laterality: N/A;   FLEXIBLE SIGMOIDOSCOPY  04/20/2012   Procedure: FLEXIBLE SIGMOIDOSCOPY;  Surgeon: Belvie JONETTA Just, MD;  Location: [facility];  Service: Endoscopy;  Laterality: N/A;   INGUINAL LYMPHADENECTOMY N/A 06/28/2023   Procedure: LYMPHADENECTOMY, INGUINAL, OPEN;  Surgeon: Viktoria Comer SAUNDERS, MD;  Location: [facility];  Service: Gynecology;   Laterality: N/A;   IR IMAGING GUIDED PORT INSERTION  07/31/2023   NISSEN FUNDOPLICATION  2011   PARAESOPHAGEAL HERNIA REPAIR  09/11/2009   and Nissen fundoplication   RADICAL VULVECTOMY N/A 06/28/2023   Procedure: VULVECTOMY, RADICAL;  Surgeon: Viktoria Comer SAUNDERS, MD;  Location: [facility];  Service: Gynecology;  Laterality: N/A;  Possible skin flap   RE-EXCISION OF BREAST LUMPECTOMY Right 08/18/2021   Procedure: RE-EXCISION RIGHT BREAST LUMPECTOMY;  Surgeon: Vanderbilt Ned, MD;  Location: [facility];  Service: General;  Laterality: Right;   SENTINEL NODE BIOPSY N/A 08/04/2021   Procedure: SENTINEL NODE BIOPSY;  Surgeon: Vanderbilt Ned, MD;  Location: [facility];  Service: General;  Laterality: N/A;   TONSILLECTOMY  1960's   TOTAL ABDOMINAL HYSTERECTOMY  2001   w/ BSO    Family History  Problem Relation Age of Onset   Colon cancer Mother 13   Alzheimer's disease Mother    Heart attack Father    Aneurysm Paternal Grandmother        brain   Stroke Paternal Grandfather    Cancer Maternal Aunt        x2   Alzheimer's disease Maternal Aunt    Ovarian cancer Maternal Aunt    Prostate cancer Maternal Uncle    Kidney disease Maternal Uncle    Atrial fibrillation Maternal Uncle    Stroke Paternal Uncle  Prostate cancer Cousin        paternal first cousin   Prostate cancer Cousin        paternal first cousin   Esophageal cancer Cousin        maternal first cousin   Breast cancer Cousin        DCIS, maternal first cousin   Ovarian cancer Cousin        maternal first cousin   Brain cancer Cousin    Rheum arthritis Other    Lung disease Neg Hx     Social History   Socioeconomic History   Marital status: Widowed    Spouse name: Lynwood   Number of children: 1   Years of education: Not on file   Highest education level: Some college, no degree  Occupational History   Occupation: disabled  Tobacco Use   Smoking status: Never   Smokeless tobacco: Never   Tobacco  comments:    brief exposure through her husband  Vaping Use   Vaping status: Never Used  Substance and Sexual Activity   Alcohol use: Never    Alcohol/week: 0.0 standard drinks of alcohol   Drug use: Never   Sexual activity: Not Currently  Other Topics Concern   Not on file  Social History Narrative   From [place] originally. Previously lived in [place]. Previously worked at [employer] also in a [employer]. Has also worked as a diplomatic services operational officer for [employer]. No pets currently. No bird exposure. No known mold in her current home.    Lives with husband   Caffeine- coffee 2 c daily   Social Drivers of Health   Tobacco Use: Low Risk (04/18/2024)   Patient History    Smoking Tobacco Use: Never    Smokeless Tobacco Use: Never    Passive Exposure: Not on file  Financial Resource Strain: Low Risk (02/01/2022)   Overall Financial Resource Strain (CARDIA)    Difficulty of Paying Living Expenses: Not hard at all  Food Insecurity: No Food Insecurity (06/28/2023)   Hunger Vital Sign    Worried About Running Out of Food in the Last Year: Never true    Ran Out of Food in the Last Year: Never true  Transportation Needs: No Transportation Needs (06/28/2023)   PRAPARE - Administrator, Civil Service (Medical): No    Lack of Transportation (Non-Medical): No  Physical Activity: Not on file  Stress: Not on file  Social Connections: Moderately Isolated (06/28/2023)   Social Connection and Isolation Panel    Frequency of Communication with Friends and Family: More than three times a week    Frequency of Social Gatherings with Friends and Family: Twice a week    Attends Religious Services: More than 4 times per year    Active Member of [employer] or Organizations: No    Attends Banker Meetings: Never    Marital Status: Widowed  Depression (PHQ2-9): Low Risk (04/04/2024)   Depression (PHQ2-9)    PHQ-2 Score: 0  Alcohol Screen: Not on file  Housing: Low Risk (06/28/2023)   Housing  Stability Vital Sign    Unable to Pay for Housing in the Last Year: No    Number of Times Moved in the Last Year: 0    Homeless in the Last Year: No  Utilities: Not At Risk (06/28/2023)   [facility]HC Utilities    Threatened with loss of utilities: No  Health Literacy: Not on file    Current Medications: Current Medications[1]  Review of Systems: + fatigue, leg swelling, itching Denies appetite changes, fevers, chills, unexplained weight changes. Denies hearing loss, neck lumps or masses, mouth sores, ringing in ears or voice changes. Denies cough or wheezing.  Denies shortness of breath. Denies chest pain or palpitations.  Denies abdominal distention, pain, blood in stools, constipation, diarrhea, nausea, vomiting, or early satiety. Denies pain with intercourse, dysuria, frequency, hematuria or incontinence. Denies hot flashes, pelvic pain, vaginal bleeding or vaginal discharge.   Denies joint pain, back pain or muscle pain/cramps. Denies rash, or wounds. Denies dizziness, headaches, numbness or seizures. Denies swollen lymph nodes or glands, denies easy bruising or bleeding. Denies anxiety, depression, confusion, or decreased concentration.  Physical Exam: BP 122/63 (BP Location: Left Arm, Patient Position: Sitting)   Pulse 66   Temp 97.8 F (36.6 C) (Oral)   Resp 19   Wt 209 lb 9.6 oz (95.1 kg)   SpO2 98%   BMI 37.13 kg/m  General: Alert, oriented, no acute distress. HEENT: Posterior oropharynx clear, sclera anicteric. Chest: Unlabored breathing on room air. Abdomen: Obese, soft, nontender.  Normoactive bowel sounds.  Some mild edema of the mons with very mild blanching erythema. Skin: No lesions or rashes. GU: Significant improvement of vulvar edema.  There is essentially no edema of the vulva itself.  There is some edema of the inner left thigh, no associated erythema or induration.  On the vulva, no leukoplakia or ulceration.    Laboratory & Radiologic Studies:    Latest  Ref Rng & Units 04/04/2024   10:40 AM 03/14/2024    1:33 PM 02/29/2024    1:31 PM  CBC  WBC 4.0 - 10.5 K/uL 10.0  11.7  4.8   Hemoglobin 12.0 - 15.0 g/dL 89.7  9.9  89.5   Hematocrit 36.0 - 46.0 % 31.2  30.7  31.8   Platelets 150 - 400 K/uL 167  188  195       Latest Ref Rng & Units 04/04/2024   10:40 AM 03/14/2024    1:33 PM 02/29/2024    1:31 PM  BMP  Glucose 70 - 99 mg/dL 99  873  899   BUN 8 - 23 mg/dL 16  21  20    Creatinine 0.44 - 1.00 mg/dL 9.02  8.71  8.79   Sodium 135 - 145 mmol/L 137  133  139   Potassium 3.5 - 5.1 mmol/L 3.9  3.6  3.8   Chloride 98 - 111 mmol/L 102  96  104   CO2 22 - 32 mmol/L 25  24  25    Calcium  8.9 - 10.3 mg/dL 9.6  9.1  9.2    Assessment & Plan: Doris Reyes is a 74 y.o. woman with Stage IIIC SCC of the vulva who presents for follow-up. She completed half of her adjuvant treatment including chemotherapy and radiation due to multiple medical issues.   No evidence of recurrence on today's examination.   Given multiple episodes of vulvar cellulitis, discussed with the patient sending in a prescription for linezolid  so that she has this at home if she starts developing symptoms.  She is concerned that something may happen over the weekend or with the weather and does not want to risk getting sick at home or not being able to come in urgently for evaluation.  Prescription for multiple doses of Diflucan  also sent to her pharmacy given symptoms concerning for recurrent yeast infection and so that she has some available with future antibiotic use.  Reportable signs and symptoms reviewed. We will plan on seeing her in the office for follow up in three months or sooner if needed.  22 minutes of total time was spent for this patient encounter, including preparation, face-to-face counseling with the patient and coordination of care, and documentation of the encounter.  Comer Dollar, MD  Division of Gynecologic Oncology  Department of Obstetrics and  Gynecology  [facility]      [1]  Current Outpatient Medications:    dicyclomine (BENTYL) 10 MG capsule, Take 10 mg by mouth. As needed for IBS, Disp: , Rfl:    fluconazole  (DIFLUCAN ) 150 MG tablet, Take 1 tablet (150 mg total) by mouth daily., Disp: 3 tablet, Rfl: 1   acetaminophen  (TYLENOL ) 650 MG CR tablet, 1,300 mg in the morning and at bedtime., Disp: , Rfl:    amLODipine  (NORVASC ) 5 MG tablet, Take 5 mg by mouth daily., Disp: , Rfl:    Ascorbic Acid (VITAMIN C) 500 MG CHEW, Chew 500 mg by mouth daily., Disp: , Rfl:    aspirin EC 81 MG tablet, Take 81 mg by mouth at bedtime. Swallow whole., Disp: , Rfl:    atenolol  (TENORMIN ) 50 MG tablet, Take 75 mg by mouth daily. , Disp: , Rfl:    benazepril  (LOTENSIN ) 10 MG tablet, Take 10 mg by mouth daily., Disp: , Rfl:    benzonatate (TESSALON) 100 MG capsule, Take 100-200 mg by mouth at bedtime., Disp: , Rfl:    Biotin 1000 MCG tablet, Take 1,000 mcg by mouth daily., Disp: , Rfl:    Calcium  Citrate-Vitamin D (CITRACAL + D PO), Take 1 tablet by mouth daily., Disp: , Rfl:    cetirizine (ZYRTEC) 10 MG tablet, Take 10 mg by mouth at bedtime., Disp: , Rfl:    chlorpheniramine-HYDROcodone (TUSSIONEX) 10-8 MG/5ML, Take 5 mLs by mouth every 12 (twelve) hours as needed for cough., Disp: 90 mL, Rfl: 0   Cholecalciferol (VITAMIN D) 50 MCG (2000 UT) tablet, Take 2,000 Units by mouth daily., Disp: , Rfl:    cyanocobalamin  1000 MCG tablet, Take 1,000 mcg by mouth daily., Disp: , Rfl:    diclofenac sodium (VOLTAREN) 1 % GEL, Apply 1 application  topically daily as needed (for pain)., Disp: , Rfl:    diphenhydrAMINE  (BENADRYL ) 25 MG tablet, Take 25 mg by mouth every 6 (six) hours as needed for allergies., Disp: , Rfl:    diphenoxylate-atropine (LOMOTIL) 2.5-0.025 MG tablet, Take 2 tablets by mouth 4 (four) times daily as needed for diarrhea or loose stools., Disp: , Rfl:    DULoxetine  (CYMBALTA ) 60 MG capsule, 2 capsules Orally once a  day, Disp: , Rfl:    EPINEPHrine  (EPIPEN  2-PAK) 0.3 mg/0.3 mL IJ SOAJ injection, Inject 0.3 mg into the muscle as needed for anaphylaxis., Disp: , Rfl:    famotidine  (PEPCID ) 20 MG tablet, Take 20 mg by mouth at bedtime., Disp: , Rfl:    fexofenadine (ALLEGRA) 180 MG tablet, Take 180 mg by mouth in the morning., Disp: , Rfl:    fluticasone  (FLONASE ) 50 MCG/ACT nasal spray, Place 1 spray into both nostrils daily., Disp: , Rfl:    gabapentin  (NEURONTIN ) 300 MG capsule, Take 1 capsule (300 mg total) by mouth at bedtime., Disp: 60 capsule, Rfl: 1   lidocaine  (XYLOCAINE ) 5 % ointment, Apply 1 Application topically 2 (two) times daily as needed for moderate pain (pain score 4-6) (to the vulva). Apply to vulva as needed for discomfort, Disp: 35.44 g, Rfl: 3  lidocaine  4 %, Place 1 patch onto the skin daily., Disp: , Rfl:    linezolid  (ZYVOX ) 600 MG tablet, Take 1 tablet (600 mg total) by mouth 2 (two) times daily., Disp: 14 tablet, Rfl: 0   NON FORMULARY, Pt uses a c-pap nightly, Disp: , Rfl:    Nutritional Supplements (JUICE PLUS FIBRE PO), Take 2 each by mouth daily. Fruits and Vegetables, Disp: , Rfl:    nystatin  (MYCOSTATIN /NYSTOP ) powder, Apply 1 Application topically 3 (three) times daily., Disp: 30 g, Rfl: 5   Omega-3 Fatty Acids (FISH OIL) 1000 MG CAPS, Take 1,000 mg by mouth daily. , Disp: , Rfl:    pantoprazole  (PROTONIX ) 40 MG tablet, Take 40 mg by mouth every evening., Disp: , Rfl:    Phenylephrine -APAP-Guaifenesin (TYLENOL  SINUS SEVERE PO), Take 2 tablets by mouth daily as needed (drainage)., Disp: , Rfl:    polyvinyl alcohol (LIQUIFILM TEARS) 1.4 % ophthalmic solution, Place 1 drop into both eyes 5 (five) times daily., Disp: , Rfl:    Probiotic Product (PROBIOTIC & ACIDOPHILUS EX ST PO), Take 1 capsule by mouth daily. Ultra Flora Plus Capsules, Disp: , Rfl:    Propylene Glycol (SYSTANE COMPLETE) 0.6 % SOLN, Place 1 drop into both eyes in the morning and at bedtime., Disp: , Rfl:     rOPINIRole  (REQUIP ) 0.5 MG tablet, Take 0.5 mg by mouth at bedtime., Disp: , Rfl:    senna-docusate (SENOKOT-S) 8.6-50 MG tablet, Take 2 tablets by mouth at bedtime. For AFTER surgery, do not take if having diarrhea, Disp: 30 tablet, Rfl: 0   simvastatin  (ZOCOR ) 10 MG tablet, Take 10 mg by mouth at bedtime.  , Disp: , Rfl:    sucralfate  (CARAFATE ) 1 GM/10ML suspension, Take 1 g by mouth 2 (two) times daily., Disp: , Rfl:    traZODone  (DESYREL ) 50 MG tablet, Take 50 mg by mouth at bedtime., Disp: , Rfl:    triamcinolone  (KENALOG ) 0.025 % ointment, Apply 1 Application topically 2 (two) times daily., Disp: 30 g, Rfl: 0   vitamin E 400 UNIT capsule, Take 400 Units by mouth daily.  , Disp: , Rfl:    White Petrolatum -Mineral Oil (REFRESH P.M. OP), Place 1 Application into both eyes at bedtime., Disp: , Rfl:

## 2024-04-18 NOTE — Patient Instructions (Signed)
 It was good to see you today.  I do not see or feel any evidence of cancer recurrence on your exam.  I will see you for follow-up in 3 months.  I sent a prescription for Diflucan  (for yeast) to your pharmacy with extra tablets. I also sent the antibiotic to your pharmacy to have if you start having symptoms.  As always, if you develop any new and concerning symptoms before your next visit, please call to see me sooner.

## 2024-04-24 ENCOUNTER — Telehealth: Payer: Self-pay | Admitting: Oncology

## 2024-04-24 NOTE — Telephone Encounter (Signed)
 Brennan called back and was notified that Dr. Dennise recommends continued close follow-up with treatment for cellulitis if she develops symptoms (no suppression antibiotics).  Ji verbalized understanding.   She also said the Dr. Viktoria had sent a prescription for diflucan  to [facility] and it was only for 3 tablets with one refill.  She said she has always had to take 7 - 10 days worth before and is wondering if Dr. Viktoria would be able to send in additional tablets to the [facility] in [place].    She does not have any itching and has not had to take any yet because she didn't have to take the llinezolid over the weekend but would ike to have it on hand if needed.

## 2024-04-24 NOTE — Telephone Encounter (Signed)
 Left a message and requested a return call regarding suppression antibiotics for cellulitis.

## 2024-04-25 ENCOUNTER — Other Ambulatory Visit (HOSPITAL_BASED_OUTPATIENT_CLINIC_OR_DEPARTMENT_OTHER): Payer: Self-pay

## 2024-04-25 ENCOUNTER — Other Ambulatory Visit: Payer: Self-pay | Admitting: Gynecologic Oncology

## 2024-04-25 ENCOUNTER — Encounter: Payer: Self-pay | Admitting: Hematology and Oncology

## 2024-04-25 DIAGNOSIS — B379 Candidiasis, unspecified: Secondary | ICD-10-CM

## 2024-04-25 MED ORDER — FLUCONAZOLE 150 MG PO TABS
150.0000 mg | ORAL_TABLET | Freq: Every day | ORAL | 3 refills | Status: AC
  Filled 2024-04-25: qty 10, 10d supply, fill #0

## 2024-04-25 NOTE — Telephone Encounter (Signed)
 Called Doris Reyes and let her know the new prescription for Difulcan has been sent to the [facility].

## 2024-05-30 ENCOUNTER — Inpatient Hospital Stay

## 2024-05-30 ENCOUNTER — Inpatient Hospital Stay: Admitting: Oncology

## 2024-07-23 ENCOUNTER — Inpatient Hospital Stay: Admitting: Gynecologic Oncology

## 2024-10-25 ENCOUNTER — Inpatient Hospital Stay: Admitting: Gynecologic Oncology
